# Patient Record
Sex: Female | Born: 1935
Health system: Southern US, Community
[De-identification: ages and names within clinical notes are randomized; demographics above are authoritative.]

## PROBLEM LIST (undated history)

## (undated) DIAGNOSIS — E785 Hyperlipidemia, unspecified: Secondary | ICD-10-CM

## (undated) DIAGNOSIS — I2510 Atherosclerotic heart disease of native coronary artery without angina pectoris: Secondary | ICD-10-CM

## (undated) DIAGNOSIS — R600 Localized edema: Secondary | ICD-10-CM

## (undated) DIAGNOSIS — I10 Essential (primary) hypertension: Secondary | ICD-10-CM

## (undated) DIAGNOSIS — I723 Aneurysm of iliac artery: Secondary | ICD-10-CM

## (undated) DIAGNOSIS — J45909 Unspecified asthma, uncomplicated: Secondary | ICD-10-CM

## (undated) DIAGNOSIS — J189 Pneumonia, unspecified organism: Secondary | ICD-10-CM

## (undated) DIAGNOSIS — C50912 Malignant neoplasm of unspecified site of left female breast: Secondary | ICD-10-CM

## (undated) DIAGNOSIS — M1990 Unspecified osteoarthritis, unspecified site: Secondary | ICD-10-CM

## (undated) DIAGNOSIS — E119 Type 2 diabetes mellitus without complications: Secondary | ICD-10-CM

## (undated) DIAGNOSIS — R0989 Other specified symptoms and signs involving the circulatory and respiratory systems: Secondary | ICD-10-CM

## (undated) HISTORY — DX: Essential (primary) hypertension: I10

## (undated) HISTORY — DX: Aneurysm of iliac artery: I72.3

## (undated) HISTORY — PX: SHOULDER ARTHROSCOPY W/ ROTATOR CUFF REPAIR: SHX2400

## (undated) HISTORY — DX: Localized edema: R60.0

## (undated) HISTORY — DX: Hyperlipidemia, unspecified: E78.5

## (undated) HISTORY — DX: Other specified symptoms and signs involving the circulatory and respiratory systems: R09.89

## (undated) HISTORY — PX: APPENDECTOMY: SHX54

## (undated) HISTORY — PX: CARDIAC CATHETERIZATION: SHX172

## (undated) HISTORY — PX: PILONIDAL CYST EXCISION: SHX744

---

## 1999-10-23 ENCOUNTER — Ambulatory Visit (HOSPITAL_COMMUNITY): Admission: RE | Admit: 1999-10-23 | Discharge: 1999-10-23 | Payer: Self-pay | Admitting: *Deleted

## 1999-10-23 ENCOUNTER — Encounter: Payer: Self-pay | Admitting: Unknown Physician Specialty

## 2002-01-14 ENCOUNTER — Ambulatory Visit (HOSPITAL_COMMUNITY): Admission: RE | Admit: 2002-01-14 | Discharge: 2002-01-14 | Payer: Self-pay | Admitting: Neurosurgery

## 2002-01-14 ENCOUNTER — Encounter: Payer: Self-pay | Admitting: Neurosurgery

## 2002-02-10 ENCOUNTER — Encounter: Admission: RE | Admit: 2002-02-10 | Discharge: 2002-02-10 | Payer: Self-pay | Admitting: Neurosurgery

## 2002-02-10 ENCOUNTER — Encounter: Payer: Self-pay | Admitting: Neurosurgery

## 2002-02-24 ENCOUNTER — Encounter: Payer: Self-pay | Admitting: Neurosurgery

## 2002-02-24 ENCOUNTER — Encounter: Admission: RE | Admit: 2002-02-24 | Discharge: 2002-02-24 | Payer: Self-pay | Admitting: Neurosurgery

## 2005-07-27 ENCOUNTER — Encounter: Payer: Self-pay | Admitting: Cardiology

## 2006-08-02 ENCOUNTER — Ambulatory Visit: Payer: Self-pay | Admitting: Cardiology

## 2006-08-03 DIAGNOSIS — R0989 Other specified symptoms and signs involving the circulatory and respiratory systems: Secondary | ICD-10-CM

## 2006-08-03 HISTORY — DX: Other specified symptoms and signs involving the circulatory and respiratory systems: R09.89

## 2006-08-12 ENCOUNTER — Ambulatory Visit: Payer: Self-pay | Admitting: Cardiology

## 2006-09-24 ENCOUNTER — Ambulatory Visit: Payer: Self-pay | Admitting: Cardiology

## 2006-10-12 ENCOUNTER — Encounter: Payer: Self-pay | Admitting: Cardiology

## 2007-07-04 ENCOUNTER — Encounter: Payer: Self-pay | Admitting: Cardiology

## 2007-12-07 ENCOUNTER — Ambulatory Visit: Payer: Self-pay | Admitting: Cardiology

## 2007-12-07 ENCOUNTER — Encounter: Payer: Self-pay | Admitting: Physician Assistant

## 2007-12-09 ENCOUNTER — Encounter: Payer: Self-pay | Admitting: Cardiology

## 2007-12-09 ENCOUNTER — Ambulatory Visit (HOSPITAL_COMMUNITY): Admission: RE | Admit: 2007-12-09 | Discharge: 2007-12-09 | Payer: Self-pay | Admitting: Cardiology

## 2007-12-09 ENCOUNTER — Ambulatory Visit: Payer: Self-pay | Admitting: Cardiology

## 2007-12-20 ENCOUNTER — Ambulatory Visit: Payer: Self-pay | Admitting: Cardiology

## 2007-12-21 ENCOUNTER — Encounter: Payer: Self-pay | Admitting: Cardiology

## 2007-12-21 DIAGNOSIS — E119 Type 2 diabetes mellitus without complications: Secondary | ICD-10-CM

## 2007-12-21 DIAGNOSIS — I10 Essential (primary) hypertension: Secondary | ICD-10-CM | POA: Insufficient documentation

## 2007-12-21 DIAGNOSIS — R0602 Shortness of breath: Secondary | ICD-10-CM

## 2007-12-21 DIAGNOSIS — R0989 Other specified symptoms and signs involving the circulatory and respiratory systems: Secondary | ICD-10-CM

## 2007-12-21 DIAGNOSIS — I519 Heart disease, unspecified: Secondary | ICD-10-CM

## 2008-11-20 ENCOUNTER — Encounter: Payer: Self-pay | Admitting: Cardiology

## 2009-03-12 ENCOUNTER — Encounter: Admission: RE | Admit: 2009-03-12 | Discharge: 2009-03-12 | Payer: Self-pay | Admitting: Orthopedic Surgery

## 2009-03-19 ENCOUNTER — Encounter: Admission: RE | Admit: 2009-03-19 | Discharge: 2009-03-19 | Payer: Self-pay | Admitting: Orthopedic Surgery

## 2009-04-02 ENCOUNTER — Encounter: Admission: RE | Admit: 2009-04-02 | Discharge: 2009-04-02 | Payer: Self-pay | Admitting: Orthopedic Surgery

## 2009-05-28 ENCOUNTER — Ambulatory Visit: Payer: Self-pay | Admitting: Cardiology

## 2009-05-28 DIAGNOSIS — I447 Left bundle-branch block, unspecified: Secondary | ICD-10-CM

## 2009-12-19 ENCOUNTER — Encounter: Admission: RE | Admit: 2009-12-19 | Discharge: 2009-12-19 | Payer: Self-pay | Admitting: Family Medicine

## 2009-12-24 ENCOUNTER — Ambulatory Visit: Payer: Self-pay | Admitting: Cardiology

## 2010-02-23 ENCOUNTER — Encounter: Payer: Self-pay | Admitting: Orthopedic Surgery

## 2010-03-04 NOTE — Assessment & Plan Note (Signed)
Summary: 6 month fu -recv reminder vs   Visit Type:  Follow-up Primary Yvette Roark:  Dr. Neita Carp   History of Present Illness: the patient is a 77 year old female with history of nonobstructive coronary artery disease, chronic diastolic dysfunction and hypertension. the patient is doing well.  She is on a dietary program.  She reports no chest pain shortness of breath orthopnea PND she reports no palpitations or syncope.  The patient does have a history of diabetes mellitus and is on statin drug therapy  Preventive Screening-Counseling & Management  Alcohol-Tobacco     Smoking Status: quit     Year Quit: 1993  Current Medications (verified): 1)  Oscal 500/200 D-3 500-200 Mg-Unit Tabs (Calcium Carbonate-Vitamin D) .... Take 1 Tablet By Mouth Once A Day 2)  Centrum Silver  Tabs (Multiple Vitamins-Minerals) .... Take 1 Tablet By Mouth Once A Day 3)  Aspir-Low 81 Mg Tbec (Aspirin) .... Take 1 Tablet By Mouth Once A Day 4)  Coq10 100 Mg Caps (Coenzyme Q10) .... Take 1 Tablet By Mouth Once A Day 5)  Fish Oil 1000 Mg Caps (Omega-3 Fatty Acids) .... Take 1 Tablet By Mouth Once A Day 6)  Metformin Hcl 500 Mg Xr24h-Tab (Metformin Hcl) .... Take 3 Tablet By Mouth Once A Day in Morning 7)  Diovan 160 Mg Tabs (Valsartan) .... Take 1 Tablet By Mouth Once A Day 8)  Simvastatin 80 Mg Tabs (Simvastatin) .... Take 1 Tablet By Mouth Once A Day 9)  Chlorthalidone 25 Mg Tabs (Chlorthalidone) .... Take 1/2 Tablet By Mouth Once A Day  Allergies (verified): No Known Drug Allergies  Comments:  Nurse/Medical Assistant: The patient's medication list and allergies were reviewed with the patient and were updated in the Medication and Allergy Lists.  Past History:  Past Medical History: Last updated: Jan 03, 2008 history of nonexertional chest pain relieved with nitroglycerin low risk it is in guarded left-sided ejection fraction 45% July 2008 exertional dyspnea and easy fatigability multiple cardiac risk  factors including hypertension,type 2 diabetes mellitus, dyslipidemia and age.Chronic lower extremity edema while femoral bruits with normalABIsJuly 2008.  Family History: Last updated: January 03, 2008 both parents are deceased, neither of whom had known coronary disease.  Father had a history of stroke and mother had a history of diabetes mellitus.  Social History: Last updated: 01/03/08 the patient lives in [place] with her husband.  She has two grown children.  She is not smoking 17 years.  She drinks alcohol occasionally.  Risk Factors: Smoking Status: quit (12/24/2009)  Vital Signs:  Patient profile:   77 year old female Height:      64.5 inches Weight:      151 pounds Pulse rate:   67 / minute BP sitting:   133 / 72  (left arm) Cuff size:   regular  Vitals Entered By: Carlye Grippe (December 24, 2009 10:49 AM)  Physical Exam  Additional Exam:  General: Well-developed, well-nourished in no distress head: Normocephalic and atraumatic eyes PERRLA/EOMI intact, conjunctiva and lids normal nose: No deformity or lesions mouth normal dentition, normal posterior pharynx neck: Supple, no JVD.  No masses, thyromegaly or abnormal cervical nodes lungs: Normal breath sounds bilaterally without wheezing.  Normal percussion heart: regular rate and rhythm with normal S1 and S2, no S3 or S4.  PMI is normal.  No pathological murmurs abdomen: Normal bowel sounds, abdomen is soft and nontender without masses, organomegaly or hernias noted.  No hepatosplenomegaly musculoskeletal: Back normal, normal gait muscle strength and tone normal pulsus: Pulse is  normal in all 4 extremities Extremities: No peripheral pitting edema neurologic: Alert and oriented x 3 skin: Intact without lesions or rashes cervical nodes: No significant adenopathy psychologic: Normal affect    Impression & Recommendations:  Problem # 1:  ESSENTIAL HYPERTENSION, BENIGN (ICD-401.1) will purchase controlled continue  medical therapy Her updated medication list for this problem includes:    Aspir-low 81 Mg Tbec (Aspirin) .Marland Kitchen... Take 1 tablet by mouth once a day    Diovan 160 Mg Tabs (Valsartan) .Marland Kitchen... Take 1 tablet by mouth once a day    Chlorthalidone 25 Mg Tabs (Chlorthalidone) .Marland Kitchen... Take 1/2 tablet by mouth once a day  Problem # 2:  AODM (ICD-250.00) patient is a statin drug therapy in the setting of nonobstructive coronary artery disease and diabetes mellitus Her updated medication list for this problem includes:    Aspir-low 81 Mg Tbec (Aspirin) .Marland Kitchen... Take 1 tablet by mouth once a day    Metformin Hcl 500 Mg Xr24h-tab (Metformin hcl) .Marland Kitchen... Take 3 tablet by mouth once a day in morning    Diovan 160 Mg Tabs (Valsartan) .Marland Kitchen... Take 1 tablet by mouth once a day  Problem # 3:  DIASTOLIC DYSFUNCTION (ICD-429.9) no evidence of fluid overload.  Continue medical therapy  Patient Instructions: 1)  Your physician recommends that you continue on your current medications as directed. Please refer to the Current Medication list given to you today. 2)  Follow up in  1 year

## 2010-03-04 NOTE — Assessment & Plan Note (Signed)
Summary: 1 YR FU   Visit Type:  Follow-up Primary Provider:  Dr. Neita Carp  CC:  follow-up visit.  History of Present Illness: the patient is a 77 year old female with history of nonobstructive coronary artery disease, chronic diastolic dysfunction and hypertension. The patient still reports some shortness of breath on moderate exertion. Today her blood pressure is not very well controlled. She'll stop taking her blood pressure readings. She denies any central chest pain. She does state that she's had some problems with left leg pain and left knee pain which has limited her exercise. This appears to be improving and she is planning to improve her exercise pattern. The patient denies any palpitations shortness of breath at rest orthopnea PND.  Preventive Screening-Counseling & Management  Alcohol-Tobacco     Smoking Status: quit     Year Quit: 1993  Current Problems (verified): 1)  Lbbb  (ICD-426.3) 2)  Dyspnea  (ICD-786.05) 3)  Essential Hypertension, Benign  (ICD-401.1) 4)  Aodm  (ICD-250.00) 5)  Femoral Bruit, Right  (ICD-785.9) 6)  Diastolic Dysfunction  (ICD-429.9)  Current Medications (verified): 1)  Caltrate 600+d 600-400 Mg-Unit Tabs (Calcium Carbonate-Vitamin D) .... Take 1 Tablet By Mouth Once A Day 2)  Centrum Silver  Tabs (Multiple Vitamins-Minerals) .... Take 1 Tablet By Mouth Once A Day 3)  Aspir-Low 81 Mg Tbec (Aspirin) .... Take 1 Tablet By Mouth Once A Day 4)  Coq10 100 Mg Caps (Coenzyme Q10) .... Take 1 Tablet By Mouth Once A Day 5)  Fish Oil 1000 Mg Caps (Omega-3 Fatty Acids) .... Take 1 Tablet By Mouth Once A Day 6)  Metformin Hcl 500 Mg Xr24h-Tab (Metformin Hcl) .... Take 2 Tablet By Mouth Once A Day 7)  Diovan 160 Mg Tabs (Valsartan) .... Take 1 Tablet By Mouth Once A Day 8)  Simvastatin 80 Mg Tabs (Simvastatin) .... Take 1 Tablet By Mouth Once A Day 9)  Chlorthalidone 25 Mg Tabs (Chlorthalidone) .... Take 1 Tablet By Mouth Once A Day  Allergies (verified): No  Known Drug Allergies  Comments:  Nurse/Medical Assistant: The patient's medications and allergies were reviewed with the patient and were updated in the Medication and Allergy Lists. Bottles reviewed.  Past History:  Past Medical History: Last updated: 12-27-2007 history of nonexertional chest pain relieved with nitroglycerin low risk it is in guarded left-sided ejection fraction 45% July 2008 exertional dyspnea and easy fatigability multiple cardiac risk factors including hypertension,type 2 diabetes mellitus, dyslipidemia and age.Chronic lower extremity edema while femoral bruits with normalABIsJuly 2008.  Family History: Last updated: 2007-12-27 both parents are deceased, neither of whom had known coronary disease.  Father had a history of stroke and mother had a history of diabetes mellitus.  Social History: Last updated: Dec 27, 2007 the patient lives in [place] with her husband.  She has two grown children.  She is not smoking 17 years.  She drinks alcohol occasionally.  Risk Factors: Smoking Status: quit (05/28/2009)  Review of Systems       The patient complains of shortness of breath.  The patient denies fatigue, malaise, fever, weight gain/loss, vision loss, decreased hearing, hoarseness, chest pain, palpitations, prolonged cough, wheezing, sleep apnea, coughing up blood, abdominal pain, blood in stool, nausea, vomiting, diarrhea, heartburn, incontinence, blood in urine, muscle weakness, joint pain, leg swelling, rash, skin lesions, headache, fainting, dizziness, depression, anxiety, enlarged lymph nodes, easy bruising or bleeding, and environmental allergies.    Vital Signs:  Patient profile:   77 year old female Height:  64.5 inches Weight:      158 pounds BMI:     26.80 Pulse rate:   80 / minute BP sitting:   160 / 91  (left arm) Cuff size:   regular  Vitals Entered By: Carlye Grippe (May 28, 2009 8:58 AM)  Nutrition Counseling: Patient's BMI is greater than  25 and therefore counseled on weight management options. CC: follow-up visit   Physical Exam  Additional Exam:  General: Well-developed, well-nourished in no distress head: Normocephalic and atraumatic eyes PERRLA/EOMI intact, conjunctiva and lids normal nose: No deformity or lesions mouth normal dentition, normal posterior pharynx neck: Supple, no JVD.  No masses, thyromegaly or abnormal cervical nodes lungs: Normal breath sounds bilaterally without wheezing.  Normal percussion heart: regular rate and rhythm with normal S1 and S2, no S3 or S4.  PMI is normal.  No pathological murmurs abdomen: Normal bowel sounds, abdomen is soft and nontender without masses, organomegaly or hernias noted.  No hepatosplenomegaly musculoskeletal: Back normal, normal gait muscle strength and tone normal pulsus: Pulse is normal in all 4 extremities Extremities: No peripheral pitting edema neurologic: Alert and oriented x 3 skin: Intact without lesions or rashes cervical nodes: No significant adenopathy psychologic: Normal affect    EKG  Procedure date:  05/28/2009  Findings:      normal sinus rhythm with left bundle branch block  Impression & Recommendations:  Problem # 1:  ESSENTIAL HYPERTENSION, BENIGN (ICD-401.1) the patient's blood pressure remains poorly controlled. This is intermittent or shortness of breath in the setting of diastolic dysfunction. I will add chlorthalidone 25 must be acute ill he The following medications were removed from the medication list:    Metoprolol Tartrate 25 Mg Tabs (Metoprolol tartrate) .Marland Kitchen... Take 1/2 tablet by mouth twice a day Her updated medication list for this problem includes:    Aspir-low 81 Mg Tbec (Aspirin) .Marland Kitchen... Take 1 tablet by mouth once a day    Diovan 160 Mg Tabs (Valsartan) .Marland Kitchen... Take 1 tablet by mouth once a day    Chlorthalidone 25 Mg Tabs (Chlorthalidone) .Marland Kitchen... Take 1 tablet by mouth once a day  Orders: EKG w/ Interpretation  (93000)  Problem # 2:  DIASTOLIC DYSFUNCTION (ICD-429.9) no evidence of volume overload. No evidence of heart failure symptoms  Problem # 3:  AODM (ICD-250.00) Assessment: Comment Only  Her updated medication list for this problem includes:    Aspir-low 81 Mg Tbec (Aspirin) .Marland Kitchen... Take 1 tablet by mouth once a day    Metformin Hcl 500 Mg Xr24h-tab (Metformin hcl) .Marland Kitchen... Take 2 tablet by mouth once a day    Diovan 160 Mg Tabs (Valsartan) .Marland Kitchen... Take 1 tablet by mouth once a day  Orders: EKG w/ Interpretation (93000)  Problem # 4:  LBBB (ICD-426.3) chronic The following medications were removed from the medication list:    Imdur 30 Mg Xr24h-tab (Isosorbide mononitrate) .Marland Kitchen... Take 1 tablet by mouth once a day    Metoprolol Tartrate 25 Mg Tabs (Metoprolol tartrate) .Marland Kitchen... Take 1/2 tablet by mouth twice a day Her updated medication list for this problem includes:    Aspir-low 81 Mg Tbec (Aspirin) .Marland Kitchen... Take 1 tablet by mouth once a day  Patient Instructions: 1)  Chlorthalidone 25mg  daily 2)  Follow up in  6 months  Prescriptions: CHLORTHALIDONE 25 MG TABS (CHLORTHALIDONE) Take 1 tablet by mouth once a day  #30 x 6   Entered by:   Hoover Brunette, LPN   Authorized by:   Lewayne Bunting, MD,  FACC   Signed by:   Hoover Brunette, LPN on 86/57/8469   Method used:   Electronically to        [facility] (retail)       [address]       [place], [place]  [address]       Ph: [phone]       Fax: [phone]   RxID:   [id number]   Handout requested.

## 2010-06-17 NOTE — Cardiovascular Report (Signed)
Penny Huynh, Penny Huynh             ACCOUNT NO.:  [id number]   MEDICAL RECORD NO.:  [id number]          PATIENT TYPE:  OIB   LOCATION:  2899                         FACILITY:  [facility]   PHYSICIAN:  Everardo Beals. Juanda Chance, MD, FACCDATE OF BIRTH:  11/23/1933   DATE OF PROCEDURE:  12/09/2007  DATE OF DISCHARGE:  12/09/2007                            CARDIAC CATHETERIZATION   CLINICAL HISTORY:  Ms.  Huynh is a 77 year old and has a history of  hypertension and recently has had symptoms of dyspnea on exertion.  She  had had a previous Myoview scan, which showed mild anterior defect,  which was fixed.  Because of her symptoms, Dr. Andee Lineman made a decision to  evaluate her with angiography.   PROCEDURE:  Left heart catheterization was performed percutaneously via  the right femoral arteries and arterial sheath and 5-French pyriform  coronary catheters.  We had some difficulty navigating up the iliac  vessel, which were calcified and had to used an exchange wire in a  Wholey wire.  Right heart catheterization was performed percutaneously  via the right femoral vein using venous sheath and Swan-Ganz  thermodilution catheter.  The patient tolerated the procedure well and  left the laboratory in satisfactory condition.  Distal aortogram was  performed to evaluate the patient for peripheral vascular disease in  view of the difficulty with passing catheters and calcified vessels.   RESULTS:  The left main coronary artery and right main coronary artery  was free of significant disease.   Left anterior descending artery:  The left anterior descending artery  gave rise to 2 diagonal branch and set of septal perforators.  There was  30% narrowing in the mid LAD and some irregularities, but no significant  obstruction.   Circumflex artery:  The circumflex artery was a moderately large vessel  gave rise to a ramus branch, a marginal branch and 2 large  posterolateral branches.  There was some irregularities,  but there was  no significant obstruction.   Right coronary artery:  The right coronary artery was a moderately large  vessel gave rise to a conus branch, a large right ventricle branch,  posterior descending branch and a very small posterolateral branch.  The  posterolateral branch had tapered quickly, but I think there was no  obstruction there.  There was no other obstruction seen.   Left ventriculogram:  The left ventriculogram performed in the RAO  projection showed very mild global hypokinesis.  The estimated ejection  fraction was 50%.   Distal aortogram:  The distal aortogram was performed, which showed  patent renal arteries and no significant aortoiliac obstruction.  There  were irregularities and with calcification.   CONCLUSION:  1. Minimal nonobstructive coronary artery disease with 30% narrowing      in the mid LAD and mild irregularities in the circumflex artery.  2. Ejection fraction 50%.  3. Patent renal arteries.  4. Mild elevation of left ventricular filling pressures.   ADDENDUM:  The right atrial pressure was 9 mean.  The pulmonary artery  pressure was 38/14 with a mean of 23.  Pulmonary wedge pressure was 18  mean.  Left ventricular pressure was 143/10.  Aortic pressure was 143/59  with mean of 84.  Cardiac output/cardiac index was 5.4/2.9  liters/minute/meter squared.   RECOMMENDATIONS:  The patient has normal coronaries and an ejection  fraction of 50%.  The pulmonary wedge pressure is mildly elevated and  she could have some dyspnea related to mild diastolic dysfunction.  We  will plan medical therapy and reassurance.  We will arrange followup  with Dr. Andee Lineman in about 2 weeks.      Penny Elvera Lennox Juanda Chance, MD, [facility]  Electronically Signed     BRB/MEDQ  D:  12/09/2007  T:  12/09/2007  Job:  [id number]   cc:   Learta Codding, MD,FACC  Fara Chute

## 2010-06-17 NOTE — Assessment & Plan Note (Signed)
[facility]                          [facility] OFFICE NOTE   SIGNA, CHEEK                    MRN:          [id number]  DATE:09/24/2006                            DOB:          Jan 13, 1934    HISTORY OF PRESENT ILLNESS:  Patient is a former patient of Dr. Eliberto Ivory.  Patient was referred after she had a CT scan done of the abdomen and the  pelvis.  On this study, it was noted that she had atherosclerotic  disease of the iliac vessels with significant focus of the right common  iliac artery with mild poststenotic dilatation.  The patient was then  referred to our office for further evaluation, particularly in light of  her initial risk factors, including hypertension and diabetes mellitus  as well as dyslipidemia.  The patient at that time did not report any  symptoms of substernal chest pain.  A Cardiolite study was performed,  which essentially was low risk.  Ejection fraction was abnormal at 45%.  There was a mild-to-moderate atrial septal defect which was  nonreversible.  There were no definite areas of reversibility seen.  Patient also had a carotid Doppler study done with no significant  internal carotid artery disease.  ABIs also were normal bilaterally at  1.01 and 1.01.  The patient clinically has no claudication.  Interestingly, since his last office visit, the patient had a single  episode of substernal chest pain which lasted 10-15 minutes while she  was in [place] visiting the arch.  The patient states that this  occurred at rest and was rather severe but not associated with shortness  of breath or diaphoresis.  She has had no further episodes since then.   MEDICATIONS:  1. Simvastatin 80 mg p.o. daily.  2. Lotrel 5/20 mg p.o. daily.  3. Furosemide 40 mg 1/2 tablet p.r.n. swelling.  4. Actos 45 mg a day.  5. Aspirin 81 mg p.o. daily.  6. Caltrate and ibuprofen p.r.n.   PHYSICAL EXAMINATION:  VITAL SIGNS:  Blood pressure  139/78, heart rate  78, weight 182 pounds.  NECK:  Normal carotid upstrokes.  No carotid bruits.  LUNGS:  Clear breath sounds bilaterally.  HEART:  Regular rate and rhythm.  Normal S1 and S2.  No murmurs, gallops  or rubs.  ABDOMEN:  Soft, nontender.  No rebound or guarding.  Good bowel sounds.  EXTREMITIES:  No clubbing, cyanosis or edema.   PROBLEM LIST:  1. Abnormal Cardiolite stress study.  See details above.  2. Multiple cardiac risk factors.  3. No significant carotid artery disease.  4. Atheromatosis and atherosclerosis of the distal aorta with possible      focal lesion in the right common iliac artery.  (Negative ABIs,      however, and no definite claudication).   PLAN:  1. The patient's Cardiolite study was abnormal, but there was no      definite ischemia noted.  The patient was actually doing quite well      and has good exercise tolerance.  She did have a single episode of  chest pain which was rather atypical.  I did tell the patient,      however, that if she has recurrent pain, given her risk factors      profile and abnormal Cardiolite study, would consider cardiac      catheterization.  I think at the present time, this is a little bit      premature; however, I told her that she needs aggressive risk      factor modification.  2. Although the patient may well have peripheral vascular stenosis,      there is no evidence by ABI that she has any functional limitation.      At this point in time, we will focus on risk factor modification.      If the patient becomes symptomatic, we could consider intervention.  3. No evidence of significant carotid artery disease.  The patient was      reassured about the results.  4. Patient can follow up with [place] in six months.     Learta Codding, MD,FACC  Electronically Signed    GED/MedQ  DD: 09/24/2006  DT: 09/25/2006  Job #: [id number]   cc:   Fara Chute

## 2010-06-17 NOTE — Assessment & Plan Note (Signed)
[facility]                          [facility] OFFICE NOTE   Penny Huynh, Penny Huynh                    MRN:          [id number]  DATE:12/07/2007                            DOB:          October 27, 1933    PRIMARY CARDIOLOGIST:  Learta Codding, MD, [facility]   REASON FOR VISIT:  Annual followup.   Since last seen here in the clinic in August 2008, by Dr. Andee Lineman, Ms.  Huynh reports a second episode of chest pain, approximately 6 weeks  ago, for which she took a single tablet of nitroglycerin.  She does not  recall the circumstances during which she had her chest pain, but does  remember having fairly prompt relief with the lone tablet of  nitroglycerin.  She has not had any recurrent chest pain since that  episode.  In fact, she continues to walk on a daily basis, some 2-3  miles a day, completing it in 20 minutes.  She denies any associated  chest discomfort.  Of note, this is only on level ground.  When queried  about climbing up a full flight of stairs, or up an incline, she does  suggest that she would have significant shortness of breath with this.  Moreover, she points out that she feels easily fatigued and worn out,  even with simple household activities.  There seems to be some  progressive nature to this, since she was last seen in the clinic.   Penny Huynh has never undergone coronary angiography.  She had an  adenosine stress test in July 2008, reviewed by Dr. Andee Lineman, which did  not suggest any definite ischemia.  There was a mild/moderate septal  defect which was nonreversible, and Dr. Andee Lineman felt that this was a low-  risk study.  Ejection fraction was calculated at 45%.   EKG today indicates NSR at 67 bpm.  Whereas there was previous evidence  of an LBBB when she was last here in the clinic, this has since  resolved.  Instead, there is symmetric T-wave inversion in leads V1 and  V2, which are new.  However, we have no previous baseline  EKGs, absent  of LBBB, for comparison.   ALLERGIES:  No known drug allergies.   CURRENT MEDICATIONS:  1. Aspirin 81 daily.  2. Actos 45 daily.  3. Furosemide 20 q.a.m.  4. Lotrel 5/20 daily.  5. Simvastatin 80 daily.   PAST MEDICAL HISTORY:  1. Hypertension.  2. Type 2 diabetes mellitus.  3. Chronic lower extremity edema.  4. Dyslipidemia.  5. Bilateral pulmonary nodules (subcentimeter).      a.     Stable by chest CT scan, June 2009.   SURGICAL HISTORY:  Remote appendectomy, pilonidal cyst, and recent right  rotator cuff surgery.   SOCIAL HISTORY:  The patient lives here in [place] with her husband.  She  has 2 grown children.  She has not smoked in 17 years.  Drinks alcohol  socially.   FAMILY HISTORY:  Both parents deceased, neither of whom had known  coronary artery disease.  Father had history of stroke and mother had  history  of diabetes mellitus.   REVIEW OF SYSTEMS:  Denies any recent development of orthopnea or PND.  Denies intermittent claudication.  Otherwise as noted per HPI, remaining  systems negative.   PHYSICAL EXAMINATION:  VITAL SIGNS:  Blood pressure currently 119/69;  pulse 71, regular; and weight 190.8 (up 8).  GENERAL:  A 77 year old female, sitting upright, no distress.  HEENT:  Normocephalic and atraumatic.  PERRLA.  EOMI.  NECK:  Palpable carotid pulses are without bruits; no JVD at 90 degrees.  No thyromegaly.  LUNGS:  Clear to auscultation in all fields.  HEART:  Regular rate and rhythm.  Positive S4.  No significant murmurs.  No rubs.  ABDOMEN:  Soft, nontender, and intact bowel sounds.  No bruits.  EXTREMITIES:  Palpable bilateral femoral pulses with bilateral bruits  (left greater than right).  A palpable dorsalis pedis pulses with 1+  lower extremity edema.  SKIN:  No obvious rash or lesions.  MUSCULOSKELETAL:  No gross deformity.  NEURO:  No focal deficit.   IMPRESSION:  1. Status post recent, nonexertional chest pain.      a.      Relieved with nitroglycerin.      b.     Low-risk adenosine Cardiolite; EF 45%, July 2008.  2. Exertional dyspnea/easy fatigability.      a.     Worrisome for anginal equivalent.  3. Multiple cardiac risk factors.      a.     Hypertension.      b.     Type 2 diabetes mellitus.      c.     Dyslipidemia.      d.     Age.  4. Chronic lower extremity edema.  5. Femoral bruits.      a.     Normal ankle-brachial index, July 2008.   PLAN:  1. Recommendation is to proceed with definitive exclusion of      significant, underlying coronary artery disease with a diagnostic      coronary angiogram.  The patient is in agreement with this plan and      risk/benefits of the procedure were discussed.  Dr. Andee Lineman is also      in accordance with this, and we will make arrangements for the      patient to be scheduled in the [facility] later this      week.  2. In the interim, her medications will be adjusted with up titration      of aspirin to full dose, addition of Imdur 30 mg daily, and      addition of Lopressor 12.5 mg b.i.d.  The patient does have p.r.n.      nitroglycerin.      Gene Serpe, PA-C  Electronically Signed      Learta Codding, MD,FACC  Electronically Signed   GS/MedQ  DD: 12/07/2007  DT: 12/08/2007  Job #: [id number]   cc:   Fara Chute

## 2010-07-31 ENCOUNTER — Encounter: Payer: Self-pay | Admitting: Cardiology

## 2010-11-04 LAB — CBC
Hemoglobin: 11.7 — ABNORMAL LOW
RBC: 3.79 — ABNORMAL LOW
RDW: 14.2
WBC: 3.3 — ABNORMAL LOW

## 2010-11-04 LAB — BASIC METABOLIC PANEL
BUN: 14
Calcium: 9.2
Chloride: 103
Creatinine, Ser: 0.79
GFR calc non Af Amer: >60
Potassium: 4

## 2010-11-04 LAB — POCT I-STAT 3, VENOUS BLOOD GAS (G3P V)
Acid-Base Excess: 1
pO2, Ven: 35

## 2010-11-04 LAB — POCT I-STAT 3, ART BLOOD GAS (G3+)
TCO2: 29
pCO2 arterial: 45.7 — ABNORMAL HIGH
pH, Arterial: 7.388
pO2, Arterial: 68 — ABNORMAL LOW

## 2010-11-04 LAB — PROTIME-INR: INR: 1

## 2010-11-04 LAB — GLUCOSE, CAPILLARY
Glucose-Capillary: 139 — ABNORMAL HIGH
Glucose-Capillary: 98

## 2010-11-10 DIAGNOSIS — E7800 Pure hypercholesterolemia, unspecified: Secondary | ICD-10-CM | POA: Insufficient documentation

## 2010-11-12 DIAGNOSIS — G5600 Carpal tunnel syndrome, unspecified upper limb: Secondary | ICD-10-CM | POA: Insufficient documentation

## 2011-03-24 ENCOUNTER — Telehealth: Payer: Self-pay | Admitting: Cardiology

## 2011-03-24 NOTE — Telephone Encounter (Signed)
10/04/10 & 12/31/10 & 03/24/11 reminder mailed to schedule f/u-srs °

## 2011-05-12 DIAGNOSIS — E7800 Pure hypercholesterolemia, unspecified: Secondary | ICD-10-CM | POA: Diagnosis not present

## 2011-05-12 DIAGNOSIS — E119 Type 2 diabetes mellitus without complications: Secondary | ICD-10-CM | POA: Diagnosis not present

## 2011-05-12 DIAGNOSIS — I10 Essential (primary) hypertension: Secondary | ICD-10-CM | POA: Diagnosis not present

## 2011-05-21 DIAGNOSIS — E119 Type 2 diabetes mellitus without complications: Secondary | ICD-10-CM | POA: Diagnosis not present

## 2011-05-21 DIAGNOSIS — E876 Hypokalemia: Secondary | ICD-10-CM | POA: Diagnosis not present

## 2011-05-21 DIAGNOSIS — E669 Obesity, unspecified: Secondary | ICD-10-CM | POA: Diagnosis not present

## 2011-05-21 DIAGNOSIS — I10 Essential (primary) hypertension: Secondary | ICD-10-CM | POA: Diagnosis not present

## 2011-05-21 DIAGNOSIS — E7800 Pure hypercholesterolemia, unspecified: Secondary | ICD-10-CM | POA: Diagnosis not present

## 2011-07-16 ENCOUNTER — Ambulatory Visit (INDEPENDENT_AMBULATORY_CARE_PROVIDER_SITE_OTHER): Payer: Medicare Other | Admitting: Cardiology

## 2011-07-16 ENCOUNTER — Encounter: Payer: Self-pay | Admitting: Cardiology

## 2011-07-16 VITALS — BP 130/76 | HR 65 | Ht 64.5 in | Wt 168.1 lb

## 2011-07-16 DIAGNOSIS — I519 Heart disease, unspecified: Secondary | ICD-10-CM | POA: Diagnosis not present

## 2011-07-16 DIAGNOSIS — I447 Left bundle-branch block, unspecified: Secondary | ICD-10-CM | POA: Diagnosis not present

## 2011-07-16 DIAGNOSIS — R0989 Other specified symptoms and signs involving the circulatory and respiratory systems: Secondary | ICD-10-CM

## 2011-07-16 DIAGNOSIS — R0602 Shortness of breath: Secondary | ICD-10-CM

## 2011-07-16 DIAGNOSIS — I2510 Atherosclerotic heart disease of native coronary artery without angina pectoris: Secondary | ICD-10-CM | POA: Diagnosis not present

## 2011-07-16 NOTE — Progress Notes (Signed)
Penny Bottoms, MD, [facility] ABIM Board Certified in Adult Cardiovascular Medicine,Internal Medicine and Critical Care Medicine    CC: Followup patient with diastolic dysfunction diabetes mellitus  HPI:  Patient has a history of nonobstructive coronary artery disease. The patient denies any chest pain orthopnea or PND. She also reports no claudication. She continues to have some shortness of breath on exertion. She did again however 17 pounds since her last office visit. There is no evidence of fluid overload and disheveled element of deconditioning. However the patient has some anterior T wave changes by EKG albeit without chest pain. The patient is due for a followup echocardiogram. Reportedly her cholesterol-lowering medications have been decreased by primary care physician given the decrease in her LDL.  PMH: reviewed and listed in Problem List in Electronic Records (and see below) Past Medical History  Diagnosis Date  . History of chest pain at rest     Nonexertional; relieved with nitroglycerin  . Ejection fraction < 50% 7/08    Low risk it is in guarded left-sided EF 45%  . Exertional dyspnea     And easy fatigability  . Dyslipidemia   . Diabetes mellitus     Type II  . Hypertension   . Lower extremity edema     Chronic  . Femoral bruit 7/08    With normal ABIs   No past surgical history on file.  Allergies/SH/FHX : available in Electronic Records for review  Not on File History   Social History  . Marital Status: Married    Spouse Name: N/A    Number of Children: 2  . Years of Education: N/A   Occupational History  . Not on file.   Social History Main Topics  . Smoking status: Former Smoker    Quit date: 12/04/1994  . Smokeless tobacco: Not on file  . Alcohol Use: Yes     Occasionally  . Drug Use: Not on file  . Sexually Active: Not on file   Other Topics Concern  . Not on file   Social History Narrative   Lives in [place] with her husband.Has 2 grown  children.   Family History  Problem Relation Age of Onset  . Diabetes Mother   . Stroke Father     Medications: Current Outpatient Prescriptions  Medication Sig Dispense Refill  . aspirin 81 MG EC tablet Take 81 mg by mouth daily.        . calcium-vitamin D (OSCAL WITH D) 500-200 MG-UNIT per tablet Take 1 tablet by mouth daily.        . chlorthalidone (HYGROTON) 25 MG tablet Take 25 mg by mouth daily.       . Coenzyme Q10 (COQ10) 100 MG CAPS Take 100 mg by mouth daily.        . metFORMIN (GLUMETZA) 500 MG (MOD) 24 hr tablet Take 1,000 mg by mouth daily with breakfast.       . Multiple Vitamins-Minerals (CENTRUM SILVER) tablet Take 1 tablet by mouth daily.        . Omega-3 Fatty Acids (FISH OIL) 1000 MG CAPS Take 1,000 mg by mouth daily.        . simvastatin (ZOCOR) 80 MG tablet Take 40 mg by mouth daily.       . valsartan (DIOVAN) 160 MG tablet Take 160 mg by mouth daily.          ROS: No nausea or vomiting. No fever or chills.No melena or hematochezia.No bleeding.No claudication  Physical  Exam: BP 130/76  Pulse 65  Ht 5' 4.5" (1.638 m)  Wt 168 lb 1.9 oz (76.259 kg)  BMI 28.41 kg/m2 General: Well-nourished African American female in no distress. Neck: Normal carotid upstroke no carotid bruits. No thyromegaly nonnodular thyroid. JVP 6 cm Lungs: Clear breath sounds bilaterally without wheezing. Cardiac: Regular rate and rhythm with normal S1-S2 no murmur rubs or gallops Vascular: No edema. Normal distal pulses Skin: Warm and dry Physcologic: Normal affect  12lead ECG: Normal sinus rhythm with anterior T-wave changes Limited bedside ECHO:N/A No images are attached to the encounter.   Assessment and Plan  DIASTOLIC DYSFUNCTION No evidence of volume overload. The patient did have significant weight gain from 100 5168 pounds but there is no evidence of this secondary to fluid overload.  DYSPNEA Patient has chronic dyspnea. In large part I think this is related to  deconditioning. The patient has a history of nonobstructive coronary artery disease. If her symptoms worsens she may need to be a candidate for a stress echocardiogram. In the interim we will proceed with a baseline echocardiogram to reassess her LV function as well as a degree of LV diastolic dysfunction  FEMORAL BRUIT, RIGHT Previously normal ABIs. No symptoms of claudication  LBBB Resolved. Possibly rate related   Patient Active Problem List  Diagnosis  . AODM  . ESSENTIAL HYPERTENSION, BENIGN  . LBBB  . DIASTOLIC DYSFUNCTION  . FEMORAL BRUIT, RIGHT  . DYSPNEA

## 2011-07-16 NOTE — Assessment & Plan Note (Signed)
No evidence of volume overload. The patient did have significant weight gain from 100 5168 pounds but there is no evidence of this secondary to fluid overload.

## 2011-07-16 NOTE — Assessment & Plan Note (Signed)
Resolved. Possibly rate related

## 2011-07-16 NOTE — Assessment & Plan Note (Signed)
Previously normal ABIs. No symptoms of claudication

## 2011-07-16 NOTE — Assessment & Plan Note (Addendum)
Patient has chronic dyspnea. In large part I think this is related to deconditioning. The patient has a history of nonobstructive coronary artery disease. If her symptoms worsens she may need to be a candidate for a stress echocardiogram. In the interim we will proceed with a baseline echocardiogram to reassess her LV function as well as a degree of LV diastolic dysfunction

## 2011-07-16 NOTE — Patient Instructions (Signed)
   Echo - do in 2 weeks  Office will contact you with results.   Your physician wants you to follow up in:  1 year.  You will receive a reminder letter in the mail one-two months in advance.  If you don't receive a letter, please call our office to schedule the follow up appointment

## 2011-07-30 ENCOUNTER — Other Ambulatory Visit (INDEPENDENT_AMBULATORY_CARE_PROVIDER_SITE_OTHER): Payer: Medicare Other

## 2011-07-30 ENCOUNTER — Other Ambulatory Visit: Payer: Self-pay

## 2011-07-30 DIAGNOSIS — R0602 Shortness of breath: Secondary | ICD-10-CM

## 2011-07-30 DIAGNOSIS — R072 Precordial pain: Secondary | ICD-10-CM

## 2011-08-03 ENCOUNTER — Telehealth: Payer: Self-pay | Admitting: *Deleted

## 2011-08-03 NOTE — Telephone Encounter (Signed)
Message copied by Lesle Chris on Mon Aug 03, 2011 12:12 PM ------      Message from: Learta Codding      Created: Sun Aug 02, 2011  1:23 PM       Echo stable

## 2011-08-03 NOTE — Telephone Encounter (Signed)
Left message to return call 

## 2011-08-07 NOTE — Telephone Encounter (Signed)
Patient notified and verbalized understanding. 

## 2011-10-13 DIAGNOSIS — E7800 Pure hypercholesterolemia, unspecified: Secondary | ICD-10-CM | POA: Diagnosis not present

## 2011-10-13 DIAGNOSIS — I10 Essential (primary) hypertension: Secondary | ICD-10-CM | POA: Diagnosis not present

## 2011-10-13 DIAGNOSIS — E876 Hypokalemia: Secondary | ICD-10-CM | POA: Diagnosis not present

## 2011-10-13 DIAGNOSIS — E119 Type 2 diabetes mellitus without complications: Secondary | ICD-10-CM | POA: Diagnosis not present

## 2011-10-15 DIAGNOSIS — J309 Allergic rhinitis, unspecified: Secondary | ICD-10-CM | POA: Insufficient documentation

## 2011-10-15 DIAGNOSIS — E7800 Pure hypercholesterolemia, unspecified: Secondary | ICD-10-CM | POA: Diagnosis not present

## 2011-10-15 DIAGNOSIS — E669 Obesity, unspecified: Secondary | ICD-10-CM | POA: Diagnosis not present

## 2011-10-15 DIAGNOSIS — I10 Essential (primary) hypertension: Secondary | ICD-10-CM | POA: Diagnosis not present

## 2011-10-15 DIAGNOSIS — E119 Type 2 diabetes mellitus without complications: Secondary | ICD-10-CM | POA: Diagnosis not present

## 2011-10-15 DIAGNOSIS — R51 Headache: Secondary | ICD-10-CM | POA: Diagnosis not present

## 2011-12-04 DIAGNOSIS — Z23 Encounter for immunization: Secondary | ICD-10-CM | POA: Diagnosis not present

## 2012-01-20 DIAGNOSIS — E119 Type 2 diabetes mellitus without complications: Secondary | ICD-10-CM | POA: Diagnosis not present

## 2012-01-20 DIAGNOSIS — H2510 Age-related nuclear cataract, unspecified eye: Secondary | ICD-10-CM | POA: Diagnosis not present

## 2012-05-12 ENCOUNTER — Telehealth: Payer: Self-pay | Admitting: Cardiology

## 2012-05-12 NOTE — Telephone Encounter (Signed)
PATIENT WILL BE FOLLOWING DEGENT 05/12/12-SRS

## 2012-05-24 DIAGNOSIS — E7800 Pure hypercholesterolemia, unspecified: Secondary | ICD-10-CM | POA: Diagnosis not present

## 2012-05-24 DIAGNOSIS — E119 Type 2 diabetes mellitus without complications: Secondary | ICD-10-CM | POA: Diagnosis not present

## 2012-05-24 DIAGNOSIS — I10 Essential (primary) hypertension: Secondary | ICD-10-CM | POA: Diagnosis not present

## 2012-09-01 ENCOUNTER — Encounter: Payer: Self-pay | Admitting: Cardiovascular Disease

## 2012-09-01 ENCOUNTER — Ambulatory Visit (INDEPENDENT_AMBULATORY_CARE_PROVIDER_SITE_OTHER): Payer: Medicare Other | Admitting: Cardiovascular Disease

## 2012-09-01 VITALS — BP 145/77 | HR 62 | Ht 64.5 in | Wt 166.0 lb

## 2012-09-01 DIAGNOSIS — R0602 Shortness of breath: Secondary | ICD-10-CM

## 2012-09-01 DIAGNOSIS — I10 Essential (primary) hypertension: Secondary | ICD-10-CM

## 2012-09-01 DIAGNOSIS — I447 Left bundle-branch block, unspecified: Secondary | ICD-10-CM

## 2012-09-01 DIAGNOSIS — I519 Heart disease, unspecified: Secondary | ICD-10-CM

## 2012-09-01 DIAGNOSIS — I5189 Other ill-defined heart diseases: Secondary | ICD-10-CM

## 2012-09-01 NOTE — Progress Notes (Signed)
Patient ID: Penny Huynh, female   DOB: [birth year], 79 y.o.   MRN: [id number]    SUBJECTIVE: Penny Huynh has a history of nonobstructive coronary artery disease. She denies any chest pain orthopnea or PND. She also reports no claudication. She continues to have some shortness of breath on exertion, which is chronic. She says her BP is normally fine at home.  She walks and does exercises in the pool.  She was diagnosed with asthma in 1993, and currently uses her inhaler once every 6-7 months.  Her most recent echo was in June 2013, and showed the following: - Left ventricle: The cavity size was normal. Wall thickness was normal. Systolic function was normal. The estimated ejection fraction was in the range of 60% to 65%. Wall motion was normal; there were no regional wall motion abnormalities. Doppler parameters are consistent with abnormal left ventricular relaxation (grade 1 diastolic dysfunction).      PMH: reviewed and listed in Problem List in Electronic Records (and see below)  Past Medical History   Diagnosis  Date   .  History of chest pain at rest      Nonexertional; relieved with nitroglycerin   .  Ejection fraction < 50%  7/08     Low risk it is in guarded left-sided EF 45%   .  Exertional dyspnea      And easy fatigability   .  Dyslipidemia    .  Diabetes mellitus      Type II   .  Hypertension    .  Lower extremity edema      Chronic   .  Femoral bruit  7/08     With normal ABIs   No past surgical history on file.  Allergies/SH/FHX : available in Electronic Records for review  Not on File  History    Social History   .  Marital Status:  Married     Spouse Name:  N/A     Number of Children:  2   .  Years of Education:  N/A      BP 145/77 Pulse 62     PHYSICAL EXAM General: NAD Neck: No JVD, no thyromegaly or thyroid nodule.  Lungs: Clear to auscultation bilaterally with normal respiratory effort. CV: Nondisplaced PMI.  Heart regular S1/S2,  no S3/S4, soft II/VI pansystolic murmur.  No peripheral edema.  No carotid bruit.  Normal pedal pulses.  Abdomen: Soft, nontender, no hepatosplenomegaly, no distention.  Neurologic: Alert and oriented x 3.  Psych: Normal affect. Extremities: No clubbing or cyanosis.     LABS: Basic Metabolic Panel: No results found for this basename: NA, K, CL, CO2, GLUCOSE, BUN, CREATININE, CALCIUM, MG, PHOS,  in the last 72 hours Liver Function Tests: No results found for this basename: AST, ALT, ALKPHOS, BILITOT, PROT, ALBUMIN,  in the last 72 hours No results found for this basename: LIPASE, AMYLASE,  in the last 72 hours CBC: No results found for this basename: WBC, NEUTROABS, HGB, HCT, MCV, PLT,  in the last 72 hours Cardiac Enzymes: No results found for this basename: CKTOTAL, CKMB, CKMBINDEX, TROPONINI,  in the last 72 hours BNP: No components found with this basename: POCBNP,  D-Dimer: No results found for this basename: DDIMER,  in the last 72 hours Hemoglobin A1C: No results found for this basename: HGBA1C,  in the last 72 hours Fasting Lipid Panel: No results found for this basename: CHOL, HDL, LDLCALC, TRIG, CHOLHDL, LDLDIRECT,  in the last 72 hours  Thyroid Function Tests: No results found for this basename: TSH, T4TOTAL, FREET3, T3FREE, THYROIDAB,  in the last 72 hours Anemia Panel: No results found for this basename: VITAMINB12, FOLATE, FERRITIN, TIBC, IRON, RETICCTPCT,  in the last 72 hours  RADIOLOGY: No results found.  Cardiac catheterization (12-09-2007):  RESULTS: The left main coronary artery and right main coronary artery  was free of significant disease.  Left anterior descending artery: The left anterior descending artery  gave rise to 2 diagonal branch and set of septal perforators. There was  30% narrowing in the mid LAD and some irregularities, but no significant  obstruction.   Circumflex artery: The circumflex artery was a moderately large vessel  gave rise to a  ramus branch, a marginal branch and 2 large  posterolateral branches. There was some irregularities, but there was  no significant obstruction.  Right coronary artery: The right coronary artery was a moderately large  vessel gave rise to a conus branch, a large right ventricle branch,  posterior descending branch and a very small posterolateral branch. The  posterolateral branch had tapered quickly, but I think there was no  obstruction there. There was no other obstruction seen.  Left ventriculogram: The left ventriculogram performed in the RAO  projection showed very mild global hypokinesis. The estimated ejection  fraction was 50%.  Distal aortogram: The distal aortogram was performed, which showed  patent renal arteries and no significant aortoiliac obstruction. There  were irregularities and with calcification.   CONCLUSION:  1. Minimal nonobstructive coronary artery disease with 30% narrowing  in the mid LAD and mild irregularities in the circumflex artery.  2. Ejection fraction 50%.  3. Patent renal arteries.  4. Mild elevation of left ventricular filling pressures.  ADDENDUM: The right atrial pressure was 9 mean. The pulmonary artery  pressure was 38/14 with a mean of 23. Pulmonary wedge pressure was 18  mean. Left ventricular pressure was 143/10. Aortic pressure was 143/59  with mean of 84. Cardiac output/cardiac index was 5.4/2.9  liters/minute/meter squared.     ASSESSMENT AND PLAN:  DIASTOLIC DYSFUNCTION  No evidence of volume overload.   HTN: I've asked her to check her BP 3-4 times a week for the next month and to inform me of the results. If they remain elevated, I would consider either increasing Chlorthalidone or adding Norvasc.  DYSPNEA  Patient has chronic dyspnea. In large part I think this is related to deconditioning, but could also be related to her asthma. I've encouraged her to use her inhaler more frequently. I also told her that this is sometimes  caused by marked elevations in blood pressure. The patient has a history of nonobstructive coronary artery disease. If her symptoms worsens she may need to be a candidate for either a stress test or PFT's.  LBBB  Chronic.      Prentice Docker, M.D., F.A.C.C.

## 2012-09-01 NOTE — Patient Instructions (Signed)
Continue all current medications. Monitor blood pressure and send readings to office Your physician wants you to follow up in:  1 year.  You will receive a reminder letter in the mail one-two months in advance.  If you don't receive a letter, please call our office to schedule the follow up appointment

## 2013-10-31 ENCOUNTER — Ambulatory Visit (INDEPENDENT_AMBULATORY_CARE_PROVIDER_SITE_OTHER): Payer: Medicare Other | Admitting: Cardiovascular Disease

## 2013-10-31 ENCOUNTER — Encounter: Payer: Self-pay | Admitting: Cardiovascular Disease

## 2013-10-31 VITALS — BP 116/70 | HR 72 | Ht 64.0 in | Wt 169.0 lb

## 2013-10-31 DIAGNOSIS — E119 Type 2 diabetes mellitus without complications: Secondary | ICD-10-CM

## 2013-10-31 DIAGNOSIS — I2510 Atherosclerotic heart disease of native coronary artery without angina pectoris: Secondary | ICD-10-CM

## 2013-10-31 DIAGNOSIS — I519 Heart disease, unspecified: Secondary | ICD-10-CM

## 2013-10-31 DIAGNOSIS — I5189 Other ill-defined heart diseases: Secondary | ICD-10-CM

## 2013-10-31 DIAGNOSIS — I447 Left bundle-branch block, unspecified: Secondary | ICD-10-CM

## 2013-10-31 DIAGNOSIS — I10 Essential (primary) hypertension: Secondary | ICD-10-CM

## 2013-10-31 NOTE — Progress Notes (Signed)
Patient ID: Penny Huynh, female   DOB: September 19, 1933, 80 y.o.   MRN: [id number]      SUBJECTIVE: The patient has a history of nonobstructive coronary disease, diastolic dysfunction, chronic dyspnea, hypertension and a left bundle branch block. The patient denies any symptoms of chest pain, palpitations, shortness of breath, lightheadedness, dizziness, leg swelling, orthopnea, PND, and syncope. She has been remarried for nearly 15 years after having been a widow for 18 years. ECG performed in the office demonstrates normal sinus rhythm.   Review of Systems: As per "subjective", otherwise negative.  No Known Allergies  Current Outpatient Prescriptions  Medication Sig Dispense Refill  . aspirin 81 MG EC tablet Take 81 mg by mouth daily.        . chlorthalidone (HYGROTON) 25 MG tablet Take 25 mg by mouth daily.       . Coenzyme Q10 (COQ10) 100 MG CAPS Take 100 mg by mouth daily.        . metFORMIN (GLUCOPHAGE) 500 MG tablet Take 500 mg by mouth 2 (two) times daily with a meal.      . Multiple Vitamins-Minerals (CENTRUM SILVER) tablet Take 1 tablet by mouth daily.        . Omega-3 Fatty Acids (FISH OIL) 1000 MG CAPS Take 1,000 mg by mouth daily.        . simvastatin (ZOCOR) 20 MG tablet Take 20 mg by mouth every evening.      . valsartan (DIOVAN) 160 MG tablet Take 160 mg by mouth daily.        . vitamin B-12 (CYANOCOBALAMIN) 500 MCG tablet Take 500 mcg by mouth daily.       No current facility-administered medications for this visit.    Past Medical History  Diagnosis Date  . History of chest pain at rest     Nonexertional; relieved with nitroglycerin  . Ejection fraction < 50% 7/08    Low risk it is in guarded left-sided EF 45%  . Exertional dyspnea     And easy fatigability  . Dyslipidemia   . Diabetes mellitus     Type II  . Hypertension   . Lower extremity edema     Chronic  . Femoral bruit 7/08    With normal ABIs    No past surgical history on file.  History    Social History  . Marital Status: Married    Spouse Name: N/A    Number of Children: 2  . Years of Education: N/A   Occupational History  . Not on file.   Social History Main Topics  . Smoking status: Former Smoker    Quit date: 02/02/1994  . Smokeless tobacco: Not on file  . Alcohol Use: Yes     Comment: Occasionally-Homemade wine- AJ  . Drug Use: No  . Sexual Activity: Not on file   Other Topics Concern  . Not on file   Social History Narrative   Lives in [place] with her husband.   Has 2 grown children.     Filed Vitals:   10/31/13 1406  BP: 116/70  Pulse: 72  Height: 5\' 4"  (1.626 m)  Weight: 169 lb (76.658 kg)  SpO2: 98%    PHYSICAL EXAM General: NAD HEENT: Normal. Neck: No JVD, no thyromegaly. Lungs: Clear to auscultation bilaterally with normal respiratory effort. CV: Nondisplaced PMI.  Regular rate and rhythm, normal S1/S2, no S3/S4, no murmur. No pretibial or periankle edema.  No carotid bruit.  Normal pedal pulses.  Abdomen:  Soft, nontender, no hepatosplenomegaly, no distention.  Neurologic: Alert and oriented x 3.  Psych: Normal affect. Skin: Normal. Musculoskeletal: Normal range of motion, no gross deformities. Extremities: No clubbing or cyanosis.   ECG: Most recent ECG reviewed.      ASSESSMENT AND PLAN: 1. Nonobstructive CAD: Symptomatically stable. Continue ASA and simvastatin. 2. Essential HTN: Controlled on chlorthalidone and valsartan. 3. Diastolic dysfunction: No evidence of volume overload. BP is well controlled. 4. Chronic dyspnea: In part related to deconditioning. Stable. 5. Type 2 diabetes: On metformin. Managed by PCP.  Dispo: f/u 1 year.  Kate Sable, M.D., F.A.C.C.

## 2013-10-31 NOTE — Patient Instructions (Signed)
Continue all current medications. Your physician wants you to follow up in:  1 year.  You will receive a reminder letter in the mail one-two months in advance.  If you don't receive a letter, please call our office to schedule the follow up appointment   

## 2014-10-04 ENCOUNTER — Ambulatory Visit: Payer: Self-pay | Admitting: Cardiovascular Disease

## 2014-10-26 ENCOUNTER — Ambulatory Visit: Payer: Self-pay | Admitting: Cardiovascular Disease

## 2014-10-29 ENCOUNTER — Encounter: Payer: Self-pay | Admitting: Cardiovascular Disease

## 2014-10-29 ENCOUNTER — Ambulatory Visit (INDEPENDENT_AMBULATORY_CARE_PROVIDER_SITE_OTHER): Payer: Medicare Other | Admitting: Cardiovascular Disease

## 2014-10-29 VITALS — BP 111/69 | HR 80 | Ht 64.56 in | Wt 160.0 lb

## 2014-10-29 DIAGNOSIS — I10 Essential (primary) hypertension: Secondary | ICD-10-CM

## 2014-10-29 DIAGNOSIS — I519 Heart disease, unspecified: Secondary | ICD-10-CM

## 2014-10-29 DIAGNOSIS — I2510 Atherosclerotic heart disease of native coronary artery without angina pectoris: Secondary | ICD-10-CM

## 2014-10-29 DIAGNOSIS — E119 Type 2 diabetes mellitus without complications: Secondary | ICD-10-CM

## 2014-10-29 DIAGNOSIS — I452 Bifascicular block: Secondary | ICD-10-CM

## 2014-10-29 DIAGNOSIS — I5189 Other ill-defined heart diseases: Secondary | ICD-10-CM

## 2014-10-29 NOTE — Patient Instructions (Signed)

## 2014-10-29 NOTE — Progress Notes (Signed)
Patient ID: Penny Huynh, female   DOB: 1933-02-12, 81 y.o.   MRN: [id number]      SUBJECTIVE: The patient presents for a routine follow-up visit. She has a history of nonobstructive coronary disease, diastolic dysfunction, chronic dyspnea, hypertension and a left bundle branch block seen in 08/2012.  She denies any symptoms of chest pain, palpitations, shortness of breath, lightheadedness, dizziness, orthopnea, PND, and syncope. She may develop mild bilateral ankle swelling if she sits for long periods of time.  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  ECG performed in the office today demonstrates normal sinus rhythm with a right bundle branch block and left anterior fascicular block. ECG in September 2015 demonstrated normal sinus rhythm with no conduction disturbances.   Review of Systems: As per "subjective", otherwise negative.  No Known Allergies  Current Outpatient Prescriptions  Medication Sig Dispense Refill  . aspirin 81 MG EC tablet Take 81 mg by mouth daily.      . chlorthalidone (HYGROTON) 25 MG tablet Take 25 mg by mouth daily.     . Coenzyme Q10 (COQ10) 100 MG CAPS Take 100 mg by mouth daily.      . metFORMIN (GLUCOPHAGE) 500 MG tablet Take 1,000 mg by mouth daily with breakfast.     . Multiple Vitamins-Minerals (CENTRUM SILVER) tablet Take 1 tablet by mouth daily.      . Omega-3 Fatty Acids (FISH OIL) 1000 MG CAPS Take 1,000 mg by mouth daily.      . simvastatin (ZOCOR) 20 MG tablet Take 20 mg by mouth every evening.    . valsartan (DIOVAN) 160 MG tablet Take 160 mg by mouth daily.      . vitamin B-12 (CYANOCOBALAMIN) 500 MCG tablet Take 500 mcg by mouth daily.     No current facility-administered medications for this visit.    Past Medical History  Diagnosis Date  . History of chest pain at rest     Nonexertional;  relieved with nitroglycerin  . Ejection fraction < 50% 7/08    Low risk it is in guarded left-sided EF 45%  . Exertional dyspnea     And easy fatigability  . Dyslipidemia   . Diabetes mellitus     Type II  . Hypertension   . Lower extremity edema     Chronic  . Femoral bruit 7/08    With normal ABIs    No past surgical history on file.  Social History   Social History  . Marital Status: Married    Spouse Name: N/A  . Number of Children: 2  . Years of Education: N/A   Occupational History  . Not on file.   Social History Main Topics  . Smoking status: Former Smoker -- 2.00 packs/day for 41 years    Types: Cigarettes    Start date: 11/09/1953    Quit date: 02/02/1994  . Smokeless tobacco: Never Used  . Alcohol Use: 0.0 oz/week    0 Standard drinks or equivalent per week     Comment: Occasionally-Homemade wine- AJ  . Drug Use: No  . Sexual Activity: Not on file   Other Topics Concern  . Not on file   Social History Narrative   Lives in [place] with her husband.   Has 2 grown children.     Filed Vitals:   10/29/14 0837  BP: 111/69  Pulse: 80  Height: 5' 4.56" (1.64 m)  Weight: 160 lb (72.576 kg)    PHYSICAL EXAM General: NAD HEENT: Normal. Neck: No JVD, no thyromegaly. Lungs: Clear to auscultation bilaterally with normal respiratory effort. CV: Nondisplaced PMI.  Regular rate and rhythm, normal S1/S2, no S3/S4, no murmur. No pretibial or periankle edema.  No carotid bruit.  Normal pedal pulses.  Abdomen: Soft, nontender, no distention.  Neurologic: Alert and oriented x 3.  Psych: Normal affect. Skin: Normal. Musculoskeletal: Normal range of motion, no gross deformities. Extremities: No clubbing or cyanosis.   ECG: Most recent ECG reviewed.      ASSESSMENT AND PLAN: 1. Nonobstructive CAD: Symptomatically stable. Continue ASA and simvastatin.  2. Essential HTN: Controlled on chlorthalidone and valsartan.  3. Diastolic dysfunction: No evidence of  volume overload. BP is well controlled.  4. Chronic dyspnea: Stable.  5. Type 2 diabetes: On metformin. Has some bilateral feet neuropathy. Managed by PCP.  Dispo: f/u 1 year.   Kate Sable, M.D., F.A.C.C.

## 2015-05-24 DIAGNOSIS — E782 Mixed hyperlipidemia: Secondary | ICD-10-CM | POA: Diagnosis not present

## 2015-05-24 DIAGNOSIS — E1140 Type 2 diabetes mellitus with diabetic neuropathy, unspecified: Secondary | ICD-10-CM | POA: Diagnosis not present

## 2015-05-24 DIAGNOSIS — E876 Hypokalemia: Secondary | ICD-10-CM | POA: Diagnosis not present

## 2015-05-24 DIAGNOSIS — E7800 Pure hypercholesterolemia, unspecified: Secondary | ICD-10-CM | POA: Diagnosis not present

## 2015-05-24 DIAGNOSIS — I10 Essential (primary) hypertension: Secondary | ICD-10-CM | POA: Diagnosis not present

## 2015-05-30 DIAGNOSIS — E1140 Type 2 diabetes mellitus with diabetic neuropathy, unspecified: Secondary | ICD-10-CM | POA: Diagnosis not present

## 2015-05-30 DIAGNOSIS — E1165 Type 2 diabetes mellitus with hyperglycemia: Secondary | ICD-10-CM | POA: Diagnosis not present

## 2015-05-30 DIAGNOSIS — R252 Cramp and spasm: Secondary | ICD-10-CM | POA: Diagnosis not present

## 2015-05-30 DIAGNOSIS — E782 Mixed hyperlipidemia: Secondary | ICD-10-CM | POA: Diagnosis not present

## 2015-05-30 DIAGNOSIS — I10 Essential (primary) hypertension: Secondary | ICD-10-CM | POA: Diagnosis not present

## 2015-07-08 DIAGNOSIS — R05 Cough: Secondary | ICD-10-CM | POA: Diagnosis not present

## 2015-07-17 DIAGNOSIS — H8110 Benign paroxysmal vertigo, unspecified ear: Secondary | ICD-10-CM | POA: Insufficient documentation

## 2015-07-18 ENCOUNTER — Emergency Department (HOSPITAL_COMMUNITY): Payer: Medicare Other

## 2015-07-18 ENCOUNTER — Observation Stay (HOSPITAL_COMMUNITY)
Admission: EM | Admit: 2015-07-18 | Discharge: 2015-07-19 | Disposition: A | Discharge status: Home / Self Care | Payer: Medicare Other | Attending: Internal Medicine | Admitting: Internal Medicine

## 2015-07-18 ENCOUNTER — Encounter (HOSPITAL_COMMUNITY): Payer: Self-pay

## 2015-07-18 DIAGNOSIS — I10 Essential (primary) hypertension: Secondary | ICD-10-CM | POA: Insufficient documentation

## 2015-07-18 DIAGNOSIS — I200 Unstable angina: Secondary | ICD-10-CM | POA: Diagnosis not present

## 2015-07-18 DIAGNOSIS — R42 Dizziness and giddiness: Secondary | ICD-10-CM | POA: Insufficient documentation

## 2015-07-18 DIAGNOSIS — E1165 Type 2 diabetes mellitus with hyperglycemia: Secondary | ICD-10-CM | POA: Diagnosis not present

## 2015-07-18 DIAGNOSIS — Z7984 Long term (current) use of oral hypoglycemic drugs: Secondary | ICD-10-CM | POA: Insufficient documentation

## 2015-07-18 DIAGNOSIS — R0789 Other chest pain: Principal | ICD-10-CM | POA: Insufficient documentation

## 2015-07-18 DIAGNOSIS — E876 Hypokalemia: Secondary | ICD-10-CM | POA: Diagnosis not present

## 2015-07-18 DIAGNOSIS — Z79899 Other long term (current) drug therapy: Secondary | ICD-10-CM | POA: Insufficient documentation

## 2015-07-18 DIAGNOSIS — R072 Precordial pain: Secondary | ICD-10-CM | POA: Diagnosis not present

## 2015-07-18 DIAGNOSIS — E782 Mixed hyperlipidemia: Secondary | ICD-10-CM | POA: Diagnosis not present

## 2015-07-18 DIAGNOSIS — Z87891 Personal history of nicotine dependence: Secondary | ICD-10-CM | POA: Insufficient documentation

## 2015-07-18 DIAGNOSIS — E785 Hyperlipidemia, unspecified: Secondary | ICD-10-CM | POA: Insufficient documentation

## 2015-07-18 DIAGNOSIS — R079 Chest pain, unspecified: Secondary | ICD-10-CM

## 2015-07-18 DIAGNOSIS — Z7982 Long term (current) use of aspirin: Secondary | ICD-10-CM | POA: Insufficient documentation

## 2015-07-18 DIAGNOSIS — H8110 Benign paroxysmal vertigo, unspecified ear: Secondary | ICD-10-CM | POA: Diagnosis not present

## 2015-07-18 HISTORY — DX: Atherosclerotic heart disease of native coronary artery without angina pectoris: I25.10

## 2015-07-18 LAB — CBC WITH DIFFERENTIAL/PLATELET
Basophils Absolute: 0 10*3/uL (ref 0.0–0.1)
Basophils Relative: 1 %
Eosinophils Absolute: 0.1 10*3/uL (ref 0.0–0.7)
Eosinophils Relative: 3 %
HEMATOCRIT: 38 % (ref 36.0–46.0)
HEMOGLOBIN: 12.6 g/dL (ref 12.0–15.0)
LYMPHS ABS: 1.8 10*3/uL (ref 0.7–4.0)
Lymphocytes Relative: 44 %
MCH: 30.7 pg (ref 26.0–34.0)
MCHC: 33.2 g/dL (ref 30.0–36.0)
MCV: 92.7 fL (ref 78.0–100.0)
MONOS PCT: 8 %
Monocytes Absolute: 0.3 10*3/uL (ref 0.1–1.0)
NEUTROS ABS: 1.9 10*3/uL (ref 1.7–7.7)
NEUTROS PCT: 44 %
Platelets: 216 10*3/uL (ref 150–400)
RBC: 4.1 MIL/uL (ref 3.87–5.11)
RDW: 13.4 % (ref 11.5–15.5)
WBC: 4.2 10*3/uL (ref 4.0–10.5)

## 2015-07-18 LAB — BASIC METABOLIC PANEL
Anion gap: 7 (ref 5–15)
BUN: 13 mg/dL (ref 6–20)
CHLORIDE: 97 mmol/L — AB (ref 101–111)
CO2: 27 mmol/L (ref 22–32)
CREATININE: 0.78 mg/dL (ref 0.44–1.00)
Calcium: 9.1 mg/dL (ref 8.9–10.3)
GFR calc non Af Amer: >60 mL/min (ref >60)
Glucose, Bld: 102 mg/dL — ABNORMAL HIGH (ref 65–99)
POTASSIUM: 3.1 mmol/L — AB (ref 3.5–5.1)
SODIUM: 131 mmol/L — AB (ref 135–145)

## 2015-07-18 LAB — TROPONIN I

## 2015-07-18 MED ORDER — ENOXAPARIN SODIUM 40 MG/0.4ML ~~LOC~~ SOLN
40.0000 mg | SUBCUTANEOUS | Status: DC
  Filled 2015-07-18: qty 0.4

## 2015-07-18 MED ORDER — ONDANSETRON HCL 4 MG/2ML IJ SOLN: 4.0000 mg | Freq: Four times a day (QID) | INTRAMUSCULAR | Status: DC | PRN

## 2015-07-18 MED ORDER — IRBESARTAN 150 MG PO TABS
150.0000 mg | ORAL_TABLET | Freq: Every day | ORAL | Status: DC
  Administered 2015-07-19: 150 mg via ORAL
  Filled 2015-07-18: qty 1

## 2015-07-18 MED ORDER — CHLORTHALIDONE 25 MG PO TABS
25.0000 mg | ORAL_TABLET | Freq: Every day | ORAL | Status: DC
  Administered 2015-07-19: 25 mg via ORAL
  Filled 2015-07-18 (×4): qty 1

## 2015-07-18 MED ORDER — MORPHINE SULFATE (PF) 2 MG/ML IV SOLN: 2.0000 mg | INTRAVENOUS | Status: DC | PRN

## 2015-07-18 MED ORDER — ASPIRIN EC 81 MG PO TBEC
81.0000 mg | DELAYED_RELEASE_TABLET | Freq: Every day | ORAL | Status: DC
Start: 2015-07-18 — End: 2015-07-19
  Administered 2015-07-18 – 2015-07-19 (×2): 81 mg via ORAL
  Filled 2015-07-18 (×2): qty 1

## 2015-07-18 MED ORDER — POTASSIUM CHLORIDE CRYS ER 20 MEQ PO TBCR
30.0000 meq | EXTENDED_RELEASE_TABLET | Freq: Once | ORAL | Status: AC
  Administered 2015-07-18: 30 meq via ORAL
  Filled 2015-07-18: qty 1

## 2015-07-18 MED ORDER — GI COCKTAIL ~~LOC~~: 30.0000 mL | Freq: Four times a day (QID) | ORAL | Status: DC | PRN

## 2015-07-18 MED ORDER — ACETAMINOPHEN 325 MG PO TABS: 650.0000 mg | ORAL_TABLET | ORAL | Status: DC | PRN

## 2015-07-18 MED ORDER — METFORMIN HCL 500 MG PO TABS: 1000.0000 mg | ORAL_TABLET | Freq: Every day | ORAL | Status: DC

## 2015-07-18 MED ORDER — SIMVASTATIN 20 MG PO TABS
40.0000 mg | ORAL_TABLET | Freq: Every evening | ORAL | Status: DC
  Administered 2015-07-18: 40 mg via ORAL
  Filled 2015-07-18: qty 2

## 2015-07-18 NOTE — ED Provider Notes (Signed)
CSN: [id number]     Arrival date & time 07/18/15  1256 History   First MD Initiated Contact with Patient 07/18/15 1308     Chief Complaint  Patient presents with  . Chest Pain  . Dizziness     (Consider location/radiation/quality/duration/timing/severity/associated sxs/prior Treatment) HPI Comments: Patient from PCPs office with a 2 day history of intermittent chest pain. It is central in her chest lasting for less than 10 minutes at a time. It is not radiate to her back, neck or arm. There is no shortness of breath. There is some nausea with the chest pain. There is no diaphoresis. It is not exertional. It is better with belching and nothing makes it worse. Her PCP was concerned about her EKG so he sent her to the ED. Patient denies any cardiac history. No history of stents. She does have diabetes and hypertension. Last stress test was several years ago. Patient also has had intermittent room spinning dizziness for the past several days that is better with sitting down. No focal weakness, numbness or tingling. No vision changes. No headache. No difficulty walking or speaking.  The history is provided by the patient and a relative.    Past Medical History  Diagnosis Date  . History of chest pain at rest     Nonexertional; relieved with nitroglycerin  . Ejection fraction < 50% 7/08    Low risk it is in guarded left-sided EF 45%  . Exertional dyspnea     And easy fatigability  . Dyslipidemia   . Diabetes mellitus     Type II  . Hypertension   . Lower extremity edema     Chronic  . Femoral bruit 7/08    With normal ABIs   History reviewed. No pertinent past surgical history. Family History  Problem Relation Age of Onset  . Diabetes Mother   . Stroke Father    Social History  Substance Use Topics  . Smoking status: Former Smoker -- 2.00 packs/day for 41 years    Types: Cigarettes    Start date: 11/09/1953    Quit date: 02/02/1994  . Smokeless tobacco: Never Used  . Alcohol  Use: 0.0 oz/week    0 Standard drinks or equivalent per week     Comment: Occasionally-Homemade wine- AJ   OB History    No data available     Review of Systems  Constitutional: Negative for fever, activity change and appetite change.  HENT: Negative for congestion and rhinorrhea.   Respiratory: Positive for chest tightness. Negative for cough and shortness of breath.   Cardiovascular: Negative for chest pain and leg swelling.  Gastrointestinal: Negative for nausea, vomiting and abdominal pain.  Genitourinary: Negative for dysuria, hematuria, vaginal bleeding and vaginal discharge.  Musculoskeletal: Negative for myalgias and arthralgias.  Skin: Negative for rash.  Neurological: Positive for dizziness and light-headedness. Negative for weakness and headaches.  A complete 10 system review of systems was obtained and all systems are negative except as noted in the HPI and PMH.      Allergies  Review of patient's allergies indicates no known allergies.  Home Medications   Prior to Admission medications   Medication Sig Start Date End Date Taking? Authorizing Provider  aspirin 81 MG EC tablet Take 81 mg by mouth daily.     Yes Historical Provider, MD  chlorthalidone (HYGROTON) 25 MG tablet Take 25 mg by mouth daily.    Yes Historical Provider, MD  Coenzyme Q10 (COQ10) 100 MG CAPS Take 100  mg by mouth daily.     Yes Historical Provider, MD  metFORMIN (GLUCOPHAGE) 500 MG tablet Take 1,000 mg by mouth daily with breakfast.    Yes Historical Provider, MD  simvastatin (ZOCOR) 20 MG tablet Take 40 mg by mouth every evening.    Yes Historical Provider, MD  valsartan (DIOVAN) 160 MG tablet Take 160 mg by mouth daily.     Yes Historical Provider, MD  vitamin B-12 (CYANOCOBALAMIN) 500 MCG tablet Take 500 mcg by mouth daily.   Yes Historical Provider, MD   BP 153/71 mmHg  Pulse 60  Temp(Src) 98.3 F (36.8 C) (Oral)  Resp 18  Ht 5\' 4"  (1.626 m)  Wt 162 lb (73.483 kg)  BMI 27.79 kg/m2   SpO2 100% Physical Exam  Constitutional: She is oriented to person, place, and time. She appears well-developed and well-nourished. No distress.  HENT:  Head: Normocephalic and atraumatic.  Mouth/Throat: Oropharynx is clear and moist. No oropharyngeal exudate.  Eyes: Conjunctivae and EOM are normal. Pupils are equal, round, and reactive to light.  Neck: Normal range of motion. Neck supple.  No meningismus.  Cardiovascular: Normal rate, regular rhythm, normal heart sounds and intact distal pulses.   No murmur heard. Pulmonary/Chest: Effort normal and breath sounds normal. No respiratory distress. She exhibits no tenderness.  Abdominal: Soft. There is no tenderness. There is no rebound and no guarding.  Musculoskeletal: Normal range of motion. She exhibits no edema or tenderness.  Neurological: She is alert and oriented to person, place, and time. No cranial nerve deficit. She exhibits normal muscle tone. Coordination normal.  No ataxia on finger to nose bilaterally. No pronator drift. 5/5 strength throughout. CN 2-12 intact.Equal grip strength. Sensation intact. No nystagmus, negative Romberg, normal gait  Skin: Skin is warm.  Psychiatric: She has a normal mood and affect. Her behavior is normal.  Nursing note and vitals reviewed.   ED Course  Procedures (including critical care time) Labs Review Labs Reviewed  BASIC METABOLIC PANEL - Abnormal; Notable for the following:    Sodium 131 (*)    Potassium 3.1 (*)    Chloride 97 (*)    Glucose, Bld 102 (*)    All other components within normal limits  CBC WITH DIFFERENTIAL/PLATELET  TROPONIN I  TROPONIN I  TROPONIN I  TROPONIN I    Imaging Review Dg Chest Portable 1 View  07/18/2015  CLINICAL DATA:  Dizziness and chest pain for 4 days EXAM: PORTABLE CHEST 1 VIEW COMPARISON:  None. FINDINGS: The heart size and mediastinal contours are within normal limits. Both lungs are clear. The visualized skeletal structures are unremarkable.  IMPRESSION: No active disease. Electronically Signed   By: Inez Catalina M.D.   On: 07/18/2015 13:32   I have personally reviewed and evaluated these images and lab results as part of my medical decision-making.   EKG Interpretation   Date/Time:  Thursday July 18 2015 13:12:56 EDT Ventricular Rate:  66 PR Interval:  173 QRS Duration: 143 QT Interval:  439 QTC Calculation: 460 R Axis:   -30 Text Interpretation:  Sinus rhythm Right bundle branch block No previous  ECGs available Confirmed by Abdishakur Gottschall  MD, Robina Hamor [phone]) on 07/18/2015  1:28:47 PM      MDM   Final diagnoses:  Chest pain, unspecified chest pain type   Intermittent central chest pain for the past 2 days not exertional. No radiation. Associated with nausea. ASA given.  EKG is right bundle-branch block, no comparison. No acute ST changes.  Troponin is negative. CXR negative. No episodes of chest pain while in the ED.  Heart score is 5. Plan admission for rule out. D/w Dr. Nehemiah Settle.  Ezequiel Essex, MD 07/18/15 2138

## 2015-07-18 NOTE — ED Notes (Signed)
Pt c/o dizziness and chest pain off and on since Monday.  Reports saw Dr. Quintin Alto today and was sent to er  for evaluation.

## 2015-07-18 NOTE — ED Notes (Signed)
MD at bedside. 

## 2015-07-18 NOTE — H&P (Signed)
History and Physical  Penny Huynh [id number] DOB: 17-Mar-1933 DOA: 07/18/2015  Referring physician: Dr Wyvonnia Dusky, ED physician PCP: Manon Hilding, MD  Outpatient Specialists:   Dr Bronson Ing  Chief Complaint: Chest tightness  HPI: Penny Huynh is a 82 y.o. female with a history of type 2 diabetes, hypertension, systolic heart failure with an EF of 45%, hyperlipidemia. She presents to the emergency department with onset of chest pain last night which improved after belching. She continued to have residual chest tightness which is present this morning and in the emergency department. No other palliating or provoking factors. She did have some nausea when she was having the chest pain, but denies diaphoresis, radiation of the pain, shortness of breath.  She did have an onset of vertigo that occurred on Monday. She no longer is vertiginous, but continues to have some feelings of disequilibrium  Review of Systems:    Pt denies any fevers, chills, nausea, vomiting, diarrhea, constipation, abdominal pain, shortness of breath, dyspnea on exertion, orthopnea, cough, wheezing, palpitations, headache, vision changes, lightheadedness, dizziness, melena, rectal bleeding.  Review of systems are otherwise negative  Past Medical History  Diagnosis Date  . History of chest pain at rest     Nonexertional; relieved with nitroglycerin  . Ejection fraction < 50% 7/08    Low risk it is in guarded left-sided EF 45%  . Exertional dyspnea     And easy fatigability  . Dyslipidemia   . Diabetes mellitus     Type II  . Hypertension   . Lower extremity edema     Chronic  . Femoral bruit 7/08    With normal ABIs   History reviewed. No pertinent past surgical history. Social History:  reports that she quit smoking about 21 years ago. Her smoking use included Cigarettes. She started smoking about 61 years ago. She has a 82 pack-year smoking history. She has never used smokeless tobacco. She  reports that she drinks alcohol. She reports that she does not use illicit drugs. Patient lives at Home  No Known Allergies  Family History  Problem Relation Age of Onset  . Diabetes Mother   . Stroke Father      Prior to Admission medications   Medication Sig Start Date End Date Taking? Authorizing Provider  aspirin 81 MG EC tablet Take 81 mg by mouth daily.     Yes Historical Provider, MD  chlorthalidone (HYGROTON) 25 MG tablet Take 25 mg by mouth daily.    Yes Historical Provider, MD  Coenzyme Q10 (COQ10) 100 MG CAPS Take 100 mg by mouth daily.     Yes Historical Provider, MD  metFORMIN (GLUCOPHAGE) 500 MG tablet Take 1,000 mg by mouth daily with breakfast.    Yes Historical Provider, MD  simvastatin (ZOCOR) 20 MG tablet Take 40 mg by mouth every evening.    Yes Historical Provider, MD  valsartan (DIOVAN) 160 MG tablet Take 160 mg by mouth daily.     Yes Historical Provider, MD  vitamin B-12 (CYANOCOBALAMIN) 500 MCG tablet Take 500 mcg by mouth daily.   Yes Historical Provider, MD    Physical Exam: BP 153/71 mmHg  Pulse 60  Temp(Src) 98.3 F (36.8 C) (Oral)  Resp 18  Ht 5\' 4"  (1.626 m)  Wt 73.483 kg (162 lb)  BMI 27.79 kg/m2  SpO2 100%  General: Older black female who appears jaundiced than stated age. Awake and alert and oriented x3. No acute cardiopulmonary distress.  HEENT: Normocephalic atraumatic.  Right  and left ears normal in appearance.  Pupils equal, round, reactive to light. Extraocular muscles are intact. Sclerae anicteric and noninjected.  Moist mucosal membranes. No mucosal lesions.  Neck: Neck supple without lymphadenopathy. No carotid bruits. No masses palpated.  Cardiovascular: Regular rate with normal S1-S2 sounds. No murmurs, rubs, gallops auscultated. No JVD.  Respiratory: Good respiratory effort with no wheezes, rales, rhonchi. Lungs clear to auscultation bilaterally.  No accessory muscle use. Abdomen: Soft, nontender, nondistended. Active bowel sounds. No  masses or hepatosplenomegaly  Skin: No rashes, lesions, or ulcerations.  Dry, warm to touch. 2+ dorsalis pedis and radial pulses. Musculoskeletal: No calf or leg pain. All major joints not erythematous nontender.  No upper or lower joint deformation.  Good ROM.  No contractures  Psychiatric: Intact judgment and insight. Pleasant and cooperative. Neurologic: No focal neurological deficits. Strength is 5/5 and symmetric in upper and lower extremities.  Cranial nerves II through XII are grossly intact.           Labs on Admission: I have personally reviewed following labs and imaging studies  CBC:  Recent Labs Lab 07/18/15 1358  WBC 4.2  NEUTROABS 1.9  HGB 12.6  HCT 38.0  MCV 92.7  PLT 123XX123   Basic Metabolic Panel:  Recent Labs Lab 07/18/15 1358  NA 131*  K 3.1*  CL 97*  CO2 27  GLUCOSE 102*  BUN 13  CREATININE 0.78  CALCIUM 9.1   GFR: Estimated Creatinine Clearance: 56 mL/min (by C-G formula based on Cr of 0.78). Liver Function Tests: No results for input(s): AST, ALT, ALKPHOS, BILITOT, PROT, ALBUMIN in the last 168 hours. No results for input(s): LIPASE, AMYLASE in the last 168 hours. No results for input(s): AMMONIA in the last 168 hours. Coagulation Profile: No results for input(s): INR, PROTIME in the last 168 hours. Cardiac Enzymes:  Recent Labs Lab 07/18/15 1358  TROPONINI <0.03   BNP (last 3 results) No results for input(s): PROBNP in the last 8760 hours. HbA1C: No results for input(s): HGBA1C in the last 72 hours. CBG: No results for input(s): GLUCAP in the last 168 hours. Lipid Profile: No results for input(s): CHOL, HDL, LDLCALC, TRIG, CHOLHDL, LDLDIRECT in the last 72 hours. Thyroid Function Tests: No results for input(s): TSH, T4TOTAL, FREET4, T3FREE, THYROIDAB in the last 72 hours. Anemia Panel: No results for input(s): VITAMINB12, FOLATE, FERRITIN, TIBC, IRON, RETICCTPCT in the last 72 hours. Urine analysis: No results found for: COLORURINE,  APPEARANCEUR, LABSPEC, PHURINE, GLUCOSEU, HGBUR, BILIRUBINUR, KETONESUR, PROTEINUR, UROBILINOGEN, NITRITE, LEUKOCYTESUR Sepsis Labs: @LABRCNTIP (procalcitonin:4,lacticidven:4) )No results found for this or any previous visit (from the past 240 hour(s)).   Radiological Exams on Admission: Dg Chest Portable 1 View  07/18/2015  CLINICAL DATA:  Dizziness and chest pain for 4 days EXAM: PORTABLE CHEST 1 VIEW COMPARISON:  None. FINDINGS: The heart size and mediastinal contours are within normal limits. Both lungs are clear. The visualized skeletal structures are unremarkable. IMPRESSION: No active disease. Electronically Signed   By: Inez Catalina M.D.   On: 07/18/2015 13:32    EKG: Independently reviewed. Sinus rhythm with right bundle branch block. No acute ST elevation or depression  Assessment/Plan: Principal Problem:   Chest pain Active Problems:   Essential hypertension, benign   Hypokalemia   Vertigo    This patient was discussed with the ED physician, including pertinent vitals, physical exam findings, labs, and imaging.  We also discussed care given by the ED provider.  #1 chest pain  Observation on telemetry for  chest pain rule out  Repeat serial enzymes #2 hypertension  Continue home medications #3 hypokalemia  Potassium 30 mEq now #4 vertigo  Consult PT  DVT prophylaxis: Lovenox Consultants: None Code Status: Full code Family Communication: None  Disposition Plan: Observation   Truett Mainland, DO [facility] Pager [phone]  If 7PM-7AM, please contact night-coverage [email or web address] Password [email or web address]

## 2015-07-19 ENCOUNTER — Other Ambulatory Visit: Payer: Self-pay | Admitting: Adult Health

## 2015-07-19 ENCOUNTER — Encounter (HOSPITAL_COMMUNITY): Payer: Self-pay | Admitting: Cardiology

## 2015-07-19 DIAGNOSIS — R079 Chest pain, unspecified: Secondary | ICD-10-CM | POA: Diagnosis not present

## 2015-07-19 DIAGNOSIS — R42 Dizziness and giddiness: Secondary | ICD-10-CM

## 2015-07-19 DIAGNOSIS — R0789 Other chest pain: Secondary | ICD-10-CM | POA: Diagnosis not present

## 2015-07-19 DIAGNOSIS — E876 Hypokalemia: Secondary | ICD-10-CM | POA: Diagnosis not present

## 2015-07-19 DIAGNOSIS — E785 Hyperlipidemia, unspecified: Secondary | ICD-10-CM | POA: Diagnosis not present

## 2015-07-19 DIAGNOSIS — I2510 Atherosclerotic heart disease of native coronary artery without angina pectoris: Secondary | ICD-10-CM

## 2015-07-19 DIAGNOSIS — I10 Essential (primary) hypertension: Secondary | ICD-10-CM

## 2015-07-19 DIAGNOSIS — I25119 Atherosclerotic heart disease of native coronary artery with unspecified angina pectoris: Secondary | ICD-10-CM

## 2015-07-19 LAB — TROPONIN I
Troponin I: <0.03 ng/mL (ref <0.031)
Troponin I: <0.03 ng/mL (ref <0.031)
Troponin I: <0.03 ng/mL (ref <0.031)

## 2015-07-19 LAB — GLUCOSE, CAPILLARY
GLUCOSE-CAPILLARY: 202 mg/dL — AB (ref 65–99)
GLUCOSE-CAPILLARY: 161 mg/dL — AB (ref 65–99)

## 2015-07-19 MED ORDER — NITROGLYCERIN 0.4 MG SL SUBL
SUBLINGUAL_TABLET | SUBLINGUAL | Status: AC
  Filled 2015-07-19: qty 1

## 2015-07-19 MED ORDER — INSULIN ASPART 100 UNIT/ML ~~LOC~~ SOLN
0.0000 [IU] | Freq: Every day | SUBCUTANEOUS | Status: DC
Start: 2015-07-19 — End: 2015-07-19

## 2015-07-19 MED ORDER — MECLIZINE HCL 12.5 MG PO TABS
12.5000 mg | ORAL_TABLET | Freq: Three times a day (TID) | ORAL | Status: DC
  Administered 2015-07-19: 12.5 mg via ORAL
  Filled 2015-07-19: qty 1

## 2015-07-19 MED ORDER — INSULIN ASPART 100 UNIT/ML ~~LOC~~ SOLN: 0.0000 [IU] | Freq: Three times a day (TID) | SUBCUTANEOUS | Status: DC

## 2015-07-19 MED ORDER — POTASSIUM CHLORIDE CRYS ER 10 MEQ PO TBCR: 10.0000 meq | EXTENDED_RELEASE_TABLET | Freq: Every day | ORAL | Status: DC

## 2015-07-19 MED ORDER — POTASSIUM CHLORIDE CRYS ER 10 MEQ PO TBCR
10.0000 meq | EXTENDED_RELEASE_TABLET | Freq: Every day | ORAL | Status: DC
Start: 2015-07-19 — End: 2015-07-19
  Administered 2015-07-19: 10 meq via ORAL
  Filled 2015-07-19: qty 1

## 2015-07-19 MED ORDER — MECLIZINE HCL 12.5 MG PO TABS: 12.5000 mg | ORAL_TABLET | Freq: Three times a day (TID) | ORAL | Status: DC | PRN

## 2015-07-19 NOTE — Consult Note (Signed)
CARDIOLOGY CONSULT NOTE   Patient ID: MI CHAP MRN: [id number] DOB/AGE: Jan 14, 1934 82 y.o.  Admit Date: 07/18/2015 Referring Physician: TRH-Elgergawy MD Primary Physician: Manon Hilding, MD Consulting Cardiologist: Rozann Lesches MD Primary Cardiologist: Kate Sable MD Reason for Consultation: Chest Pain   Clinical Summary Ms. Rusher is a 82 y.o.female with known history of mild non-obstructive CAD as of 2009, RBBB, hypertension, diastolic dysfunction, and chronic dyspnea. Last cardiac cath 2009 with 30% narrowing in mid LAD and mild irregularities in the circumflex artery. EF of 50%. Normally sees Dr. Bronson Ing in the [place] office.  She was in her usual state of health until about a month ago, when she got back from a tour, riding a bus. She had a chest cold with coughing after coming home and was placed on antibiotics and steroids taper pack. She took her last dose Sunday and began to have profound dizziness the following day.  She began to have chest discomfort the day after. She made appointment with PCP, Dr. Quintin Alto, who saw her on Thursday. Due to symptoms of dizziness and chest discomfort, she was sent to ER.   She presents to the ER with complaints of chest pain. BP 110/77 HR 71, O2 Sat 98% afebrile. Pertinent labs revealed hypokalemia with potassium of 3.1, Na of 131. EKG -NSR with RBBB. She was admitted to r/o ACS. Troponin has been negative X 4. Potassium has been replaced.  .  Currently chest pain has subsided. She continues dizziness while in the bed and when up to the bathroom. She states it comes in waves. Dr. Quintin Alto ordered her ant-vertigo medications but did came to ER before filling.   No Known Allergies  Medications Scheduled Medications: . aspirin EC  81 mg Oral Daily  . chlorthalidone  25 mg Oral Daily  . enoxaparin (LOVENOX) injection  40 mg Subcutaneous Q24H  . insulin aspart  0-5 Units Subcutaneous QHS  . insulin aspart  0-9 Units  Subcutaneous TID WC  . irbesartan  150 mg Oral Daily  . meclizine  12.5 mg Oral TID  . nitroGLYCERIN      . potassium chloride  10 mEq Oral Daily  . simvastatin  40 mg Oral QPM    PRN Medications: acetaminophen, gi cocktail, morphine injection, ondansetron (ZOFRAN) IV   Past Medical History  Diagnosis Date  . Coronary atherosclerosis     Minor nonobstructive CAD at cardiac catheterization 2009  . Dyslipidemia   . Type 2 diabetes mellitus ([place])   . Hypertension   . Lower extremity edema     Chronic  . Femoral bruit 7/08    With normal ABIs    History reviewed. No pertinent past surgical history.  Family History  Problem Relation Age of Onset  . Diabetes Mother   . Stroke Father     Social History Ms. Bedrosian reports that she quit smoking about 21 years ago. Her smoking use included Cigarettes. She started smoking about 61 years ago. She has a 82 pack-year smoking history. She has never used smokeless tobacco. Ms. Buist reports that she drinks alcohol.  Review of Systems Complete review of systems are found to be negative unless outlined in H&P above. Dizziness as outlined. No syncope.  Physical Examination Blood pressure 142/72, pulse 62, temperature 98.4 F (36.9 C), temperature source Oral, resp. rate 20, height 5\' 4"  (1.626 m), weight 162 lb (73.483 kg), SpO2 98 %.  Intake/Output Summary (Last 24 hours) at 07/19/15 1517 Last data filed at 07/19/15 0900  Gross per 24 hour  Intake    120 ml  Output      0 ml  Net    120 ml    Telemetry: NSR, rates in the  70's. No pauses or arrhythmia   GEN: No acute distress  HEENT: Conjunctiva and lids normal, oropharynx clear with moist mucosa. Neck: Supple, no elevated JVP no carotid bruits, no thyromegaly. Lungs: Clear to auscultation, nonlabored breathing at rest. Cardiac: Regular rate and rhythm, no S3 or significant systolic murmur, no pericardial rub. Abdomen: Soft, nontender, no hepatomegaly, bowel sounds  present, no guarding or rebound. Extremities: No pitting edema, distal pulses 2+. Skin: Warm and dry. Musculoskeletal: No kyphosis. Neuropsychiatric: Alert and oriented x3, affect grossly appropriate.  Prior Cardiac Testing/Procedures  Echocardiogram 07/25/2011.  Left ventricle: The cavity size was normal. Wall thickness was normal. Systolic function was normal. The estimated ejection fraction was in the range of 60% to 65%. Wall motion was normal; there were no regional wall motion abnormalities. Doppler parameters are consistent with abnormal left ventricular relaxation (grade 1 diastolic dysfunction). - Aortic valve: Valve area: 2.35cm^2(VTI). Valve area: 2.34cm^2 (Vmax). - Atrial septum: No defect or patent foramen ovale was identified.  Cardiac Cath 06/04/2007 CONCLUSION: 1. Minimal nonobstructive coronary artery disease with 30% narrowing  in the mid LAD and mild irregularities in the circumflex artery. 2. Ejection fraction 50%. 3. Patent renal arteries. 4. Mild elevation of left ventricular filling pressures.  Lab Results  Basic Metabolic Panel:  Recent Labs Lab 07/18/15 1358  NA 131*  K 3.1*  CL 97*  CO2 27  GLUCOSE 102*  BUN 13  CREATININE 0.78  CALCIUM 9.1    CBC:  Recent Labs Lab 07/18/15 1358  WBC 4.2  NEUTROABS 1.9  HGB 12.6  HCT 38.0  MCV 92.7  PLT 216    Cardiac Enzymes:  Recent Labs Lab 07/18/15 1358 07/18/15 2055 07/19/15 0022 07/19/15 0659 07/19/15 1316  TROPONINI <0.03 <0.03 <0.03 <0.03 <0.03    Radiology: Dg Chest Portable 1 View  07/18/2015  CLINICAL DATA:  Dizziness and chest pain for 4 days EXAM: PORTABLE CHEST 1 VIEW COMPARISON:  None. FINDINGS: The heart size and mediastinal contours are within normal limits. Both lungs are clear. The visualized skeletal structures are unremarkable. IMPRESSION: No active disease. Electronically Signed   By: Inez Catalina M.D.   On: 07/18/2015 13:32    ECG: Sinus  rhythm with right bundle branch block and no acute ST segment changes.  Impression and Recommendations  1. Chest Pain: Troponin negative X 4 without acute ST.T waves changes, arguing against ACS. She was hypokalemic on admission. Takes chlorthalidone but is not on potassium replacement at home. Will need to have low dose potassium daily with OP lab follow up.   2. Hypertension: On Diovan at home. BP is controlled.   3. CAD: Non-obstructive per cardiac cath in 2009. Can consider OP stress test if symptoms persist. She is pain free now. Continue ASA and statin.   4. Dizziness: After being antibiotics and steroids. No carotid bruits appreciated. May benefit from [place]. Watch tele for bradycardia or pauses Chest orthostatics   5. Hypercholesterolemia: Continue statin therapy.   Signed: Phill Myron. Lawrence NP [place]  07/19/2015, 3:17 PM Co-Sign MD   Attending note:  Patient seen and examined. Reviewed records and updated the chart. Modified above note by Ms. Lawrence NP. Ms. Walsworth presents to the hospital with symptoms of chest discomfort and also recent dizziness. She had had a recent URI  with cough, treated with antibiotics and steroids, recently completed her dose. Seen by her PCP and referred to ER. Under observation she has had some recurrent atypical chest discomfort, but cardiac enzymes have been completely normal, and ECG shows no acute ST segment changes with old right bundle-branch block. Chest x-ray shows no acute process.  On examination this afternoon she is comfortable, just returned from a walk in the hall with PT, no chest pain or unusual shortness of breath. Heart rate in the 0000000 and systolic blood pressure [id number]. Lungs are clear without labored breathing, cardiac exam with RRR no gallop. Troponin I level negative 5.  Patient with atypical chest pain, currently resolved. No definite ACS by cardiac enzymes and ECG stable. She has a history of mild nonobstructive CAD by cardiac  catheterization in 2009. She has not undergone any recent ischemic evaluations. Anticipate discharge home with recommendation to follow-up for outpatient exercise Cardiolite and then see Dr. Bronson Ing back in the [place] office within the next one to 2 weeks. Discussed with patient and husband and they are in agreement.  Satira Sark, M.D., F.A.C.C.

## 2015-07-19 NOTE — Care Management Obs Status (Signed)
[facility] NOTIFICATION   Patient Details  Name: Penny Huynh MRN: [id number] Date of Birth: 1933/08/19   Medicare Observation Status Notification Given:  Yes    Sherald Barge, RN 07/19/2015, 1:47 PM

## 2015-07-19 NOTE — Evaluation (Signed)
Physical Therapy Evaluation Patient Details Name: Penny Huynh MRN: [id number] DOB: [birth year] Today's Date: 07/19/2015   History of Present Illness  Penny Huynh is a 82yo black female who comes to [facility] direct from PCP after some CP. She awoke Monday morning with 'room spinning' sensation that lasted for about 30 minutes and then gradually began to decrease, but never fully resolved, hence she went to see her PCP. The PCP noted the vertigo and CP (which later resolved with eructation). At eval, CP has fully resolved, pt reports she thought it was gas related all along, however diziness persists to a low degree.   Clinical Impression  Pt is received in bed, husband in room. Pt reports a head cold in the first week of May with persistent cough, but denies any involvement of ears. Hearing is symmetrical and without acute impairment. Smooth pursuits are WNL, saccades are intact. End-range occular gaze to the right is suspect for minimal left beating nystagmus, but will require additional testing equipment to thoroughly assess; end-range left occular gaze is WNL. VOR head thrust is unremarkable. Head turns during ambulation demonstrate repeated LOB with head turns over the right shoulder only. Vertical head turns are mildly provocative to balance, downward > upward.   Recommending patient follow-up with ENT and/or neurology after DC if symptoms persist and are concerning/limting. No additional PT services needed at this time. PT signing off.      Follow Up Recommendations No PT follow up    Equipment Recommendations  None recommended by PT    Recommendations for Other Services Other (comment) (ENT and/or neurology FU after DC. )     Precautions / Restrictions Precautions Precautions: None      Mobility  Bed Mobility Overal bed mobility: Independent                Transfers Overall transfer level: Independent Equipment used: None                 Ambulation/Gait Ambulation/Gait assistance: Programmer, systems Rankin (Stroke Patients Only)       Balance Overall balance assessment: Modified Independent (LOB with head turns to Right and down. )                                           Pertinent Vitals/Pain Pain Assessment: No/denies pain    Home Living Family/patient expects to be discharged to:: Private residence Living Arrangements: Spouse/significant other Available Help at Discharge: Family           Home Equipment: None      Prior Function Level of Independence: Independent               Hand Dominance        Extremity/Trunk Assessment                         Communication   Communication: No difficulties  Cognition Arousal/Alertness: Awake/alert Behavior During Therapy: WFL for tasks assessed/performed Overall Cognitive Status: Within Functional Limits for tasks assessed                      General Comments      Exercises  Assessment/Plan    PT Assessment Patent does not need any further PT services  PT Diagnosis Other (comment) (unsteadiness of feet. )   PT Problem List    PT Treatment Interventions     PT Goals (Current goals can be found in the Care Plan section) Acute Rehab PT Goals Patient Stated Goal: bring an end to this dizziness PT Goal Formulation: All assessment and education complete, DC therapy    Frequency     Barriers to discharge        Co-evaluation               End of Session Equipment Utilized During Treatment: Gait belt Activity Tolerance: Patient tolerated treatment well Patient left: in bed;with call bell/phone within reach;Other (comment);with family/visitor present (Cardioloy in room. ) Nurse Communication: Other (comment)    Functional Limitation: Mobility: Walking and moving around Mobility: Walking and Moving Around  Current Status [phone]): At least 1 percent but less than 20 percent impaired, limited or restricted Mobility: Walking and Moving Around Goal Status [id number]): At least 1 percent but less than 20 percent impaired, limited or restricted Mobility: Walking and Moving Around Discharge Status [phone]): At least 1 percent but less than 20 percent impaired, limited or restricted    Time: 1510-1524 PT Time Calculation (min) (ACUTE ONLY): 14 min   Charges:   PT Evaluation $PT Eval Low Complexity: 1 Procedure     PT G Codes:   PT G-Codes **NOT FOR INPATIENT CLASS** Functional Limitation: Mobility: Walking and moving around Mobility: Walking and Moving Around Current Status [id number]): At least 1 percent but less than 20 percent impaired, limited or restricted Mobility: Walking and Moving Around Goal Status [phone]): At least 1 percent but less than 20 percent impaired, limited or restricted Mobility: Walking and Moving Around Discharge Status [phone]): At least 1 percent but less than 20 percent impaired, limited or restricted    3:42 PM, 07/19/2015 Etta Grandchild, PT, DPT PRN Physical Therapist - [place] License # [id number] [phone] (mobile)

## 2015-07-19 NOTE — Progress Notes (Signed)
**Note De-Identified Davon Folta Obfuscation** STAT EKG complete; abnormal results reported to RN and placed in patients chart

## 2015-07-19 NOTE — Discharge Summary (Signed)
Penny Huynh, is a 82 y.o. female  DOB 11-Mar-1933  MRN [id number].  Admission date:  07/18/2015  Admitting Physician  Truett Mainland, DO  Discharge Date:  07/19/2015   Primary MD  Manon Hilding, MD  Recommendations for primary care physician for things to follow:  - To follow with cardiology as an outpatient, stress test has been arranged as an outpatient   Admission Diagnosis  Chest pain, unspecified chest pain type [R07.9]   Discharge Diagnosis  Chest pain, unspecified chest pain type [R07.9]    Principal Problem:   Chest pain Active Problems:   Essential hypertension, benign   Hypokalemia   Vertigo      Past Medical History  Diagnosis Date  . Coronary atherosclerosis     Minor nonobstructive CAD at cardiac catheterization 2009  . Dyslipidemia   . Type 2 diabetes mellitus ([place])   . Hypertension   . Lower extremity edema     Chronic  . Femoral bruit 7/08    With normal ABIs    History reviewed. No pertinent past surgical history.     History of present illness and  Hospital Course:     Kindly see H&P for history of present illness and admission details, please review complete Labs, Consult reports and Test reports for all details in brief  HPI  from the history and physical done on the day of admission 6/15/017 HPI: Penny Huynh is a 83 y.o. female with a history of type 2 diabetes, hypertension, systolic heart failure with an EF of 45%, hyperlipidemia. She presents to the emergency department with onset of chest pain last night which improved after belching. She continued to have residual chest tightness which is present this morning and in the emergency department. No other palliating or provoking factors. She did have some nausea when she was having the chest pain, but denies diaphoresis, radiation of the pain, shortness of breath.  She did have an onset of vertigo that  occurred on Monday. She no longer is vertiginous, but continues to have some feelings of disequilibrium   Hospital Course   82 year old female with history of hypertension, diabetes mellitus, hyperlipidemia, presents with complaints of chest pain, admitted for further workup.   Chest pain - Patient was admitted for observation, no significant events on telemetry, negative cardiac enzymes, seen by cardiology, continue with aspirin, statin, chest pain appears not typical, currently resolved, plan for stress test as an outpatient, event to see cardiology after that.  Hypertension - Blood pressure acceptable, continue with home medication   diabetes mellitus  - Resume  hypokalemia  - Repleted, continue potassium supplement as she is on chlorthalidone    CAD - Nonobstructive CAD in 2012, January the aspirin and statin  Hyperlipidemia - Continue with statin  Vertigo - PT consulted, will start on when necessary meclizine  Discharge Condition:  stable   Follow UP  Follow-up Information    Follow up with Kate Sable, MD On 07/31/2015.   Specialty:  Cardiology   Why:  1:40 PM   Contact information:   [place] [address] [phone]       Follow up with [facility] On 07/26/2015.   Why:  Stress Test Register at 7:15 am         Discharge Instructions  and  Discharge Medications     Discharge Instructions    Discharge instructions    Complete by:  As directed   Follow with Primary MD Manon Hilding, MD in 7 days   Get CBC, CMP,  checked  by Primary MD next visit.    Activity: As tolerated with Full fall precautions use walker/cane & assistance as needed   Disposition Home   Diet: Heart Healthy , carbohydrate modified, with feeding assistance and aspiration precautions.  For Heart failure patients - Check your Weight same time everyday, if you gain over 2 pounds, or you develop in leg swelling, experience more shortness of breath  or chest pain, call your Primary MD immediately. Follow Cardiac Low Salt Diet and 1.5 lit/day fluid restriction.   On your next visit with your primary care physician please Get Medicines reviewed and adjusted.   Please request your Prim.MD to go over all Hospital Tests and Procedure/Radiological results at the follow up, please get all Hospital records sent to your Prim MD by signing hospital release before you go home.   If you experience worsening of your admission symptoms, develop shortness of breath, life threatening emergency, suicidal or homicidal thoughts you must seek medical attention immediately by calling 911 or calling your MD immediately  if symptoms less severe.  You Must read complete instructions/literature along with all the possible adverse reactions/side effects for all the Medicines you take and that have been prescribed to you. Take any new Medicines after you have completely understood and accpet all the possible adverse reactions/side effects.   Do not drive, operating heavy machinery, perform activities at heights, swimming or participation in water activities or provide baby sitting services if your were admitted for syncope or siezures until you have seen by Primary MD or a Neurologist and advised to do so again.  Do not drive when taking Pain medications.    Do not take more than prescribed Pain, Sleep and Anxiety Medications  Special Instructions: If you have smoked or chewed Tobacco  in the last 2 yrs please stop smoking, stop any regular Alcohol  and or any Recreational drug use.  Wear Seat belts while driving.   Please note  You were cared for by a hospitalist during your hospital stay. If you have any questions about your discharge medications or the care you received while you were in the hospital after you are discharged, you can call the unit and asked to speak with the hospitalist on call if the hospitalist that took care of you is not available. Once  you are discharged, your primary care physician will handle any further medical issues. Please note that NO REFILLS for any discharge medications will be authorized once you are discharged, as it is imperative that you return to your primary care physician (or establish a relationship with a primary care physician if you do not have one) for your aftercare needs so that they can reassess your need for medications and monitor your lab values.     Increase activity slowly    Complete by:  As directed             Medication List    TAKE these  medications        aspirin 81 MG EC tablet  Take 81 mg by mouth daily.     chlorthalidone 25 MG tablet  Commonly known as:  HYGROTON  Take 25 mg by mouth daily.     CoQ10 100 MG Caps  Take 100 mg by mouth daily.     meclizine 12.5 MG tablet  Commonly known as:  ANTIVERT  Take 1 tablet (12.5 mg total) by mouth 3 (three) times daily as needed for dizziness.     metFORMIN 500 MG tablet  Commonly known as:  GLUCOPHAGE  Take 1,000 mg by mouth daily with breakfast.     potassium chloride 10 MEQ tablet  Commonly known as:  K-DUR,KLOR-CON  Take 1 tablet (10 mEq total) by mouth daily.     simvastatin 20 MG tablet  Commonly known as:  ZOCOR  Take 40 mg by mouth every evening.     valsartan 160 MG tablet  Commonly known as:  DIOVAN  Take 160 mg by mouth daily.     vitamin B-12 500 MCG tablet  Commonly known as:  CYANOCOBALAMIN  Take 500 mcg by mouth daily.          Diet and Activity recommendation: See Discharge Instructions above   Consults obtained -  Cardiolgoy   Major procedures and Radiology Reports - PLEASE review detailed and final reports for all details, in brief -      Dg Chest Portable 1 View  07/18/2015  CLINICAL DATA:  Dizziness and chest pain for 4 days EXAM: PORTABLE CHEST 1 VIEW COMPARISON:  None. FINDINGS: The heart size and mediastinal contours are within normal limits. Both lungs are clear. The visualized  skeletal structures are unremarkable. IMPRESSION: No active disease. Electronically Signed   By: Inez Catalina M.D.   On: 07/18/2015 13:32    Micro Results    No results found for this or any previous visit (from the past 240 hour(s)).     Today   Subjective:   Penny Huynh today has no headache,no chest abdominal pain,no new weakness tingling or numbness, feels much better wants to go home today.   Objective:   Blood pressure 142/72, pulse 62, temperature 98.4 F (36.9 C), temperature source Oral, resp. rate 20, height 5\' 4"  (1.626 m), weight 73.483 kg (162 lb), SpO2 98 %.   Intake/Output Summary (Last 24 hours) at 07/19/15 1549 Last data filed at 07/19/15 0900  Gross per 24 hour  Intake    120 ml  Output      0 ml  Net    120 ml    Exam Awake Alert, Oriented x 3, No new F.N deficits, Normal affect [place].AT,PERRAL Supple Neck,No JVD, No cervical lymphadenopathy appriciated.  Symmetrical Chest wall movement, Good air movement bilaterally, CTAB RRR,No Gallops,Rubs or new Murmurs, No Parasternal Heave +ve B.Sounds, Abd Soft, Non tender, No organomegaly appriciated, No rebound -guarding or rigidity. No Cyanosis, Clubbing or edema, No new Rash or bruise  Data Review   CBC w Diff: Lab Results  Component Value Date   WBC 4.2 07/18/2015   HGB 12.6 07/18/2015   HCT 38.0 07/18/2015   PLT 216 07/18/2015   LYMPHOPCT 44 07/18/2015   MONOPCT 8 07/18/2015   EOSPCT 3 07/18/2015   BASOPCT 1 07/18/2015    CMP: Lab Results  Component Value Date   NA 131* 07/18/2015   K 3.1* 07/18/2015   CL 97* 07/18/2015   CO2 27 07/18/2015   BUN 13  07/18/2015   CREATININE 0.78 07/18/2015  .   Total Time in preparing paper work, data evaluation and todays exam - 35 minutes  Jase Himmelberger M.D on 07/19/2015 at 3:49 PM  [facility]   Office  [phone]

## 2015-07-19 NOTE — Discharge Instructions (Signed)
Follow with Primary MD Manon Hilding, MD in 7 days   Get CBC, CMP,  checked  by Primary MD next visit.    Activity: As tolerated with Full fall precautions use walker/cane & assistance as needed   Disposition Home   Diet: Heart Healthy , carbohydrate modified, with feeding assistance and aspiration precautions.  For Heart failure patients - Check your Weight same time everyday, if you gain over 2 pounds, or you develop in leg swelling, experience more shortness of breath or chest pain, call your Primary MD immediately. Follow Cardiac Low Salt Diet and 1.5 lit/day fluid restriction.   On your next visit with your primary care physician please Get Medicines reviewed and adjusted.   Please request your Prim.MD to go over all Hospital Tests and Procedure/Radiological results at the follow up, please get all Hospital records sent to your Prim MD by signing hospital release before you go home.   If you experience worsening of your admission symptoms, develop shortness of breath, life threatening emergency, suicidal or homicidal thoughts you must seek medical attention immediately by calling 911 or calling your MD immediately  if symptoms less severe.  You Must read complete instructions/literature along with all the possible adverse reactions/side effects for all the Medicines you take and that have been prescribed to you. Take any new Medicines after you have completely understood and accpet all the possible adverse reactions/side effects.   Do not drive, operating heavy machinery, perform activities at heights, swimming or participation in water activities or provide baby sitting services if your were admitted for syncope or siezures until you have seen by Primary MD or a Neurologist and advised to do so again.  Do not drive when taking Pain medications.    Do not take more than prescribed Pain, Sleep and Anxiety Medications  Special Instructions: If you have smoked or chewed Tobacco  in  the last 2 yrs please stop smoking, stop any regular Alcohol  and or any Recreational drug use.  Wear Seat belts while driving.   Please note  You were cared for by a hospitalist during your hospital stay. If you have any questions about your discharge medications or the care you received while you were in the hospital after you are discharged, you can call the unit and asked to speak with the hospitalist on call if the hospitalist that took care of you is not available. Once you are discharged, your primary care physician will handle any further medical issues. Please note that NO REFILLS for any discharge medications will be authorized once you are discharged, as it is imperative that you return to your primary care physician (or establish a relationship with a primary care physician if you do not have one) for your aftercare needs so that they can reassess your need for medications and monitor your lab values.

## 2015-07-19 NOTE — Care Management Note (Signed)
Case Management Note  Patient Details  Name: TRALANA GETTELFINGER MRN: [id number] Date of Birth: November 21, 1933  Subjective/Objective:                  Pt admitted with r/o CP. Pt is from home, family at bedside. Pt is ind with ADL's. No HH services or DME prior to admission. Pt has PCP, drives herself to appointments and has no difficulty affording her medications. Pt plans to return home with self care.   Action/Plan: No CM needs anticipated.   Expected Discharge Date:    07/19/2015              Expected Discharge Plan:  Home/Self Care  In-House Referral:  NA  Discharge planning Services  CM Consult  Post Acute Care Choice:  NA Choice offered to:  NA  DME Arranged:    DME Agency:     HH Arranged:    HH Agency:     Status of Service:  Completed, signed off  Medicare Important Message Given:    Date Medicare IM Given:    Medicare IM give by:    Date Additional Medicare IM Given:    Additional Medicare Important Message give by:     If discussed at [place] of Stay Meetings, dates discussed:    Additional Comments:  Sherald Barge, RN 07/19/2015, 1:50 PM

## 2015-07-19 NOTE — Progress Notes (Deleted)
PROGRESS NOTE                                                                                                                                                                                                             Patient Demographics:    Penny Huynh, is a 82 y.o. female, DOB - 12/27/1933, [id number]  Admit date - 07/18/2015   Admitting Physician Truett Mainland, DO  Outpatient Primary MD for the patient is Manon Hilding, MD  LOS -    Chief Complaint  Patient presents with  . Chest Pain  . Dizziness       Brief Narrative   82 year old female with history of hypertension, diabetes mellitus, hyperlipidemia, presents with complaints of chest pain, admitted for further workup.   Subjective:    Orpha Bur today has, No headache, No chest pain, No abdominal pain -   Assessment  & Plan :    Principal Problem:   Chest pain Active Problems:   Essential hypertension, benign   Hypokalemia   Vertigo   Chest pain - Patient was admitted for observation, no significant events on telemetry, negative cardiac enzymes, seen by cardiology, continue with aspirin, statin  Hypertension - Blood pressure acceptable, continue with home medication    diabetes mellitus  - Continue to hold metformin, start an insulin sliding scale    hypokalemia  - Repleted, continuous supplement as she is on chlorthalidone    CAD - Nonobstructive CAD in 2012, January the aspirin and statin  Hyperlipidemia - Continue with statin  Vertigo - PT consult, will start meclizine   Code Status : Full  Family Communication  : none at bedsdie  Disposition Plan  : Home   Consults  :  Cardiology  Procedures  : none  DVT Prophylaxis  :  Lovenox   Lab Results  Component Value Date   PLT 216 07/18/2015    Antibiotics  :    Anti-infectives    None        Objective:   Filed Vitals:   07/19/15 0150 07/19/15 0633 07/19/15 1020 07/19/15 1259    BP: 144/67 153/68 142/64 142/72  Pulse: 57 57 64 62  Temp: 98.3 F (36.8 C) 97.9 F (36.6 C) 98.2 F (36.8 C) 98.4 F (36.9 C)  TempSrc: Oral Oral Oral Oral  Resp: 20 20 18  20  Height:      Weight:      SpO2: 100% 100% 99% 98%    Wt Readings from Last 3 Encounters:  07/18/15 73.483 kg (162 lb)  10/29/14 72.576 kg (160 lb)  10/31/13 76.658 kg (169 lb)     Intake/Output Summary (Last 24 hours) at 07/19/15 1456 Last data filed at 07/19/15 0900  Gross per 24 hour  Intake    120 ml  Output      0 ml  Net    120 ml     Physical Exam  Awake Alert, Oriented X 3, No new F.N deficits, Normal affect .AT,PERRAL Supple Neck,No JVD, No cervical lymphadenopathy appriciated.  Symmetrical Chest wall movement, Good air movement bilaterally, CTAB RRR,No Gallops,Rubs or new Murmurs, No Parasternal Heave +ve B.Sounds, Abd Soft, No tenderness, No organomegaly appriciated, No rebound - guarding or rigidity. No Cyanosis, Clubbing or edema, No new Rash or bruise      Data Review:    CBC  Recent Labs Lab 07/18/15 1358  WBC 4.2  HGB 12.6  HCT 38.0  PLT 216  MCV 92.7  MCH 30.7  MCHC 33.2  RDW 13.4  LYMPHSABS 1.8  MONOABS 0.3  EOSABS 0.1  BASOSABS 0.0    Chemistries   Recent Labs Lab 07/18/15 1358  NA 131*  K 3.1*  CL 97*  CO2 27  GLUCOSE 102*  BUN 13  CREATININE 0.78  CALCIUM 9.1   ------------------------------------------------------------------------------------------------------------------ No results for input(s): CHOL, HDL, LDLCALC, TRIG, CHOLHDL, LDLDIRECT in the last 72 hours.  No results found for: HGBA1C ------------------------------------------------------------------------------------------------------------------ No results for input(s): TSH, T4TOTAL, T3FREE, THYROIDAB in the last 72 hours.  Invalid input(s): FREET3 ------------------------------------------------------------------------------------------------------------------ No results  for input(s): VITAMINB12, FOLATE, FERRITIN, TIBC, IRON, RETICCTPCT in the last 72 hours.  Coagulation profile No results for input(s): INR, PROTIME in the last 168 hours.  No results for input(s): DDIMER in the last 72 hours.  Cardiac Enzymes  Recent Labs Lab 07/19/15 0022 07/19/15 0659 07/19/15 1316  TROPONINI <0.03 <0.03 <0.03   ------------------------------------------------------------------------------------------------------------------ No results found for: BNP  Inpatient Medications  Scheduled Meds: . aspirin EC  81 mg Oral Daily  . chlorthalidone  25 mg Oral Daily  . enoxaparin (LOVENOX) injection  40 mg Subcutaneous Q24H  . insulin aspart  0-5 Units Subcutaneous QHS  . insulin aspart  0-9 Units Subcutaneous TID WC  . irbesartan  150 mg Oral Daily  . nitroGLYCERIN      . potassium chloride  10 mEq Oral Daily  . simvastatin  40 mg Oral QPM   Continuous Infusions:  PRN Meds:.acetaminophen, gi cocktail, morphine injection, ondansetron (ZOFRAN) IV  Micro Results No results found for this or any previous visit (from the past 240 hour(s)).  Radiology Reports Dg Chest Portable 1 View  07/18/2015  CLINICAL DATA:  Dizziness and chest pain for 4 days EXAM: PORTABLE CHEST 1 VIEW COMPARISON:  None. FINDINGS: The heart size and mediastinal contours are within normal limits. Both lungs are clear. The visualized skeletal structures are unremarkable. IMPRESSION: No active disease. Electronically Signed   By: Inez Catalina M.D.   On: 07/18/2015 13:32    Time Spent in minutes  25 minutes   Karelly Dewalt M.D on 07/19/2015 at 2:56 PM  Between 7am to 7pm - Pager - [phone]  After 7pm go to [email or web address] - password [facility]  [facility] -  Office  [phone]

## 2015-07-20 NOTE — Progress Notes (Signed)
Patient discharged with instructions, prescription, and care notes.  Verbalized understanding via teach back.  IV was removed and the site was WNL. Patient voiced no further complaints or concerns at the time of discharge.  Appointments scheduled per instructions.  Patient left the floor via w/c with staff and family in stable condition. 

## 2015-07-20 NOTE — Progress Notes (Signed)
   07/19/15 1220  Vitals  BP (!) 142/65 mmHg  Pulse Rate 66  Pulse Rate Source Dinamap  Pain Assessment  Pain Assessment 0-10  Pain Score 10  Pain Type Acute pain  Pain Location Chest  Pain Orientation Mid;Upper  Pain Descriptors / Indicators Pressure  Pain Frequency Constant  Pain Intervention(s) Medication (See eMAR) (NTG)   I was called to the patients' room due to her complaint of chest pain.  She did not have any further complaints at the time outside the chest pain. EKG was ordered and NTG was administered.   Olga Millers NP was still on the floor and was notified of the situation.  I notified her of the chest pain and interventions and after the interventions were done the patients chest pain was gone per the patient.  Late entry.

## 2015-07-26 ENCOUNTER — Encounter (HOSPITAL_COMMUNITY): Payer: Medicare Other

## 2015-07-26 ENCOUNTER — Ambulatory Visit (HOSPITAL_COMMUNITY): Payer: Medicare Other

## 2015-07-26 ENCOUNTER — Inpatient Hospital Stay (HOSPITAL_COMMUNITY): Admit: 2015-07-26 | Payer: Medicare Other

## 2015-07-31 ENCOUNTER — Encounter: Payer: Medicare Other | Admitting: Cardiovascular Disease

## 2015-08-02 ENCOUNTER — Encounter (HOSPITAL_COMMUNITY): Admission: RE | Admit: 2015-08-02 | Payer: Medicare Other | Source: Ambulatory Visit

## 2015-08-02 ENCOUNTER — Ambulatory Visit (HOSPITAL_COMMUNITY): Admission: RE | Admit: 2015-08-02 | Payer: Medicare Other | Source: Ambulatory Visit

## 2015-08-02 ENCOUNTER — Encounter (HOSPITAL_COMMUNITY)
Admission: RE | Admit: 2015-08-02 | Discharge: 2015-08-02 | Disposition: A | Discharge status: Home / Self Care | Payer: Medicare Other | Source: Ambulatory Visit | Attending: Adult Health | Admitting: Adult Health

## 2015-08-02 DIAGNOSIS — I2510 Atherosclerotic heart disease of native coronary artery without angina pectoris: Secondary | ICD-10-CM

## 2015-08-07 ENCOUNTER — Encounter: Payer: Medicare Other | Admitting: Cardiovascular Disease

## 2015-08-09 ENCOUNTER — Encounter (HOSPITAL_COMMUNITY)
Admission: RE | Admit: 2015-08-09 | Discharge: 2015-08-09 | Disposition: A | Discharge status: Home / Self Care | Payer: Medicare Other | Source: Ambulatory Visit | Attending: Adult Health | Admitting: Adult Health

## 2015-08-09 ENCOUNTER — Inpatient Hospital Stay (HOSPITAL_COMMUNITY): Admission: RE | Admit: 2015-08-09 | Payer: Medicare Other | Source: Ambulatory Visit

## 2015-08-09 ENCOUNTER — Encounter (HOSPITAL_COMMUNITY): Payer: Self-pay

## 2015-08-09 DIAGNOSIS — I2510 Atherosclerotic heart disease of native coronary artery without angina pectoris: Secondary | ICD-10-CM | POA: Diagnosis not present

## 2015-08-09 LAB — NM MYOCAR MULTI W/SPECT W/WALL MOTION / EF
CHL CUP NUCLEAR SDS: 0
CHL CUP NUCLEAR SRS: 0
CHL CUP NUCLEAR SSS: 0
CHL CUP RESTING HR STRESS: 60 {beats}/min
CSEPED: 5 min
CSEPEDS: 10 s
Estimated workload: 7 METS
LV dias vol: 43 mL (ref 46–106)
LV sys vol: 14 mL
MPHR: 141 {beats}/min
Peak HR: 141 {beats}/min
Percent HR: 100 %
RATE: 0.77
RPE: 13
TID: 0.87

## 2015-08-09 MED ORDER — SODIUM CHLORIDE 0.9% FLUSH
INTRAVENOUS | Status: AC
  Filled 2015-08-09: qty 10

## 2015-08-09 MED ORDER — TECHNETIUM TC 99M TETROFOSMIN IV KIT
30.0000 | PACK | Freq: Once | INTRAVENOUS | Status: AC | PRN
  Administered 2015-08-09: 30 via INTRAVENOUS

## 2015-08-09 MED ORDER — REGADENOSON 0.4 MG/5ML IV SOLN
INTRAVENOUS | Status: AC
  Filled 2015-08-09: qty 5

## 2015-08-09 MED ORDER — TECHNETIUM TC 99M TETROFOSMIN IV KIT
10.0000 | PACK | Freq: Once | INTRAVENOUS | Status: AC | PRN
  Administered 2015-08-09: 10 via INTRAVENOUS

## 2015-08-20 ENCOUNTER — Encounter: Payer: Self-pay | Admitting: Cardiovascular Disease

## 2015-08-20 ENCOUNTER — Ambulatory Visit (INDEPENDENT_AMBULATORY_CARE_PROVIDER_SITE_OTHER): Payer: Medicare Other | Admitting: Cardiovascular Disease

## 2015-08-20 VITALS — BP 137/79 | HR 64 | Ht 64.0 in | Wt 167.0 lb

## 2015-08-20 DIAGNOSIS — I2510 Atherosclerotic heart disease of native coronary artery without angina pectoris: Secondary | ICD-10-CM

## 2015-08-20 DIAGNOSIS — Z9289 Personal history of other medical treatment: Secondary | ICD-10-CM

## 2015-08-20 DIAGNOSIS — I452 Bifascicular block: Secondary | ICD-10-CM | POA: Diagnosis not present

## 2015-08-20 DIAGNOSIS — I5189 Other ill-defined heart diseases: Secondary | ICD-10-CM

## 2015-08-20 DIAGNOSIS — I10 Essential (primary) hypertension: Secondary | ICD-10-CM

## 2015-08-20 DIAGNOSIS — I519 Heart disease, unspecified: Secondary | ICD-10-CM | POA: Diagnosis not present

## 2015-08-20 DIAGNOSIS — Z87898 Personal history of other specified conditions: Secondary | ICD-10-CM

## 2015-08-20 NOTE — Patient Instructions (Signed)

## 2015-08-20 NOTE — Progress Notes (Signed)
Patient ID: Penny Huynh, female   DOB: May 01, 1933, 82 y.o.   MRN: [id number]      SUBJECTIVE: The patient returns for post hospitalization follow-up. She was hospitalized in June for atypical chest discomfort and ruled out for an acute coronary syndrome. She subsequently underwent a normal nuclear stress test on 08/09/15 and demonstrated a hypertensive response to exercise. She has a history of nonobstructive coronary artery disease. ECG in June showed sinus rhythm with a right bundle branch block.  She has had no further episodes of chest pain. She has occasional exertional dyspnea. She walks 2-3 miles every morning and then works out for 45 minutes at a gym.  Review of Systems: As per "subjective", otherwise negative.  No Known Allergies  Current Outpatient Prescriptions  Medication Sig Dispense Refill  . aspirin 81 MG EC tablet Take 81 mg by mouth daily.      . chlorthalidone (HYGROTON) 25 MG tablet Take 25 mg by mouth daily.     . Coenzyme Q10 (COQ10) 100 MG CAPS Take 100 mg by mouth daily.      . meclizine (ANTIVERT) 12.5 MG tablet Take 1 tablet (12.5 mg total) by mouth 3 (three) times daily as needed for dizziness. 30 tablet 0  . metFORMIN (GLUCOPHAGE) 500 MG tablet Take 1,000 mg by mouth daily with breakfast.     . potassium chloride (K-DUR,KLOR-CON) 10 MEQ tablet Take 1 tablet (10 mEq total) by mouth daily. 30 tablet 0  . simvastatin (ZOCOR) 20 MG tablet Take 40 mg by mouth every evening.     . valsartan (DIOVAN) 160 MG tablet Take 160 mg by mouth daily.      . vitamin B-12 (CYANOCOBALAMIN) 500 MCG tablet Take 500 mcg by mouth daily.     No current facility-administered medications for this visit.    Past Medical History  Diagnosis Date  . Coronary atherosclerosis     Minor nonobstructive CAD at cardiac catheterization 2009  . Dyslipidemia   . Type 2 diabetes mellitus ([place])   . Hypertension   . Lower extremity edema     Chronic  . Femoral bruit 7/08    With normal  ABIs    No past surgical history on file.  Social History   Social History  . Marital Status: Married    Spouse Name: N/A  . Number of Children: 2  . Years of Education: N/A   Occupational History  . Not on file.   Social History Main Topics  . Smoking status: Former Smoker -- 2.00 packs/day for 41 years    Types: Cigarettes    Start date: 11/09/1953    Quit date: 02/02/1994  . Smokeless tobacco: Never Used  . Alcohol Use: 0.0 oz/week    0 Standard drinks or equivalent per week     Comment: Occasionally-Homemade wine- AJ  . Drug Use: No  . Sexual Activity: Not on file   Other Topics Concern  . Not on file   Social History Narrative   Lives in [place] with her husband.   Has 2 grown children.     Filed Vitals:   08/20/15 1448  BP: 137/79  Pulse: 64  Height: 5\' 4"  (1.626 m)  Weight: 167 lb (75.751 kg)    PHYSICAL EXAM General: NAD HEENT: Normal. Neck: No JVD, no thyromegaly. Lungs: Clear to auscultation bilaterally with normal respiratory effort. CV: Nondisplaced PMI.  Regular rate and rhythm, normal S1/S2, no S3/S4, no murmur. No pretibial or periankle edema.  No carotid  bruit.   Abdomen: Soft, nontender, no distention.  Neurologic: Alert and oriented.  Psych: Normal affect. Skin: Normal. Musculoskeletal: No gross deformities.    ECG: Most recent ECG reviewed.      ASSESSMENT AND PLAN: 1. Nonobstructive CAD: Symptomatically stable. Normal nuclear stress test on 08/09/15. Continue ASA and simvastatin.  2. Essential HTN: Controlled on chlorthalidone and valsartan. No changes.  3. Diastolic dysfunction: No evidence of volume overload. BP is well controlled.  Dispo: fu 1 year.  Kate Sable, M.D., F.A.C.C.

## 2015-09-18 DIAGNOSIS — E782 Mixed hyperlipidemia: Secondary | ICD-10-CM | POA: Diagnosis not present

## 2015-09-18 DIAGNOSIS — I10 Essential (primary) hypertension: Secondary | ICD-10-CM | POA: Diagnosis not present

## 2015-09-18 DIAGNOSIS — E876 Hypokalemia: Secondary | ICD-10-CM | POA: Diagnosis not present

## 2015-09-18 DIAGNOSIS — E1140 Type 2 diabetes mellitus with diabetic neuropathy, unspecified: Secondary | ICD-10-CM | POA: Diagnosis not present

## 2015-09-18 DIAGNOSIS — E7800 Pure hypercholesterolemia, unspecified: Secondary | ICD-10-CM | POA: Diagnosis not present

## 2015-09-23 DIAGNOSIS — E1140 Type 2 diabetes mellitus with diabetic neuropathy, unspecified: Secondary | ICD-10-CM | POA: Diagnosis not present

## 2015-09-23 DIAGNOSIS — E782 Mixed hyperlipidemia: Secondary | ICD-10-CM | POA: Diagnosis not present

## 2015-09-23 DIAGNOSIS — E1165 Type 2 diabetes mellitus with hyperglycemia: Secondary | ICD-10-CM | POA: Diagnosis not present

## 2015-09-23 DIAGNOSIS — I10 Essential (primary) hypertension: Secondary | ICD-10-CM | POA: Diagnosis not present

## 2015-09-23 DIAGNOSIS — Z6829 Body mass index (BMI) 29.0-29.9, adult: Secondary | ICD-10-CM | POA: Diagnosis not present

## 2015-09-23 DIAGNOSIS — R252 Cramp and spasm: Secondary | ICD-10-CM | POA: Diagnosis not present

## 2015-11-08 DIAGNOSIS — Z23 Encounter for immunization: Secondary | ICD-10-CM | POA: Diagnosis not present

## 2015-11-20 DIAGNOSIS — Z23 Encounter for immunization: Secondary | ICD-10-CM | POA: Diagnosis not present

## 2015-11-20 DIAGNOSIS — R05 Cough: Secondary | ICD-10-CM | POA: Diagnosis not present

## 2015-11-20 DIAGNOSIS — Z6829 Body mass index (BMI) 29.0-29.9, adult: Secondary | ICD-10-CM | POA: Diagnosis not present

## 2015-12-19 DIAGNOSIS — R5383 Other fatigue: Secondary | ICD-10-CM | POA: Diagnosis not present

## 2015-12-19 DIAGNOSIS — Z6829 Body mass index (BMI) 29.0-29.9, adult: Secondary | ICD-10-CM | POA: Diagnosis not present

## 2015-12-19 DIAGNOSIS — R0602 Shortness of breath: Secondary | ICD-10-CM | POA: Diagnosis not present

## 2015-12-19 DIAGNOSIS — R42 Dizziness and giddiness: Secondary | ICD-10-CM | POA: Diagnosis not present

## 2016-01-09 DIAGNOSIS — E876 Hypokalemia: Secondary | ICD-10-CM | POA: Diagnosis not present

## 2016-01-09 DIAGNOSIS — E782 Mixed hyperlipidemia: Secondary | ICD-10-CM | POA: Diagnosis not present

## 2016-01-09 DIAGNOSIS — I10 Essential (primary) hypertension: Secondary | ICD-10-CM | POA: Diagnosis not present

## 2016-01-09 DIAGNOSIS — E7800 Pure hypercholesterolemia, unspecified: Secondary | ICD-10-CM | POA: Diagnosis not present

## 2016-01-09 DIAGNOSIS — E1140 Type 2 diabetes mellitus with diabetic neuropathy, unspecified: Secondary | ICD-10-CM | POA: Diagnosis not present

## 2016-01-09 DIAGNOSIS — E1165 Type 2 diabetes mellitus with hyperglycemia: Secondary | ICD-10-CM | POA: Diagnosis not present

## 2016-01-12 DIAGNOSIS — E782 Mixed hyperlipidemia: Secondary | ICD-10-CM | POA: Insufficient documentation

## 2016-01-12 DIAGNOSIS — E1140 Type 2 diabetes mellitus with diabetic neuropathy, unspecified: Secondary | ICD-10-CM | POA: Insufficient documentation

## 2016-01-13 DIAGNOSIS — R252 Cramp and spasm: Secondary | ICD-10-CM | POA: Diagnosis not present

## 2016-01-13 DIAGNOSIS — M79605 Pain in left leg: Secondary | ICD-10-CM | POA: Diagnosis not present

## 2016-01-13 DIAGNOSIS — I10 Essential (primary) hypertension: Secondary | ICD-10-CM | POA: Diagnosis not present

## 2016-01-13 DIAGNOSIS — E1140 Type 2 diabetes mellitus with diabetic neuropathy, unspecified: Secondary | ICD-10-CM | POA: Diagnosis not present

## 2016-01-13 DIAGNOSIS — Z1389 Encounter for screening for other disorder: Secondary | ICD-10-CM | POA: Diagnosis not present

## 2016-01-13 DIAGNOSIS — E782 Mixed hyperlipidemia: Secondary | ICD-10-CM | POA: Diagnosis not present

## 2016-01-13 DIAGNOSIS — M79604 Pain in right leg: Secondary | ICD-10-CM | POA: Diagnosis not present

## 2016-01-13 DIAGNOSIS — R05 Cough: Secondary | ICD-10-CM | POA: Diagnosis not present

## 2016-07-15 DIAGNOSIS — I10 Essential (primary) hypertension: Secondary | ICD-10-CM | POA: Diagnosis not present

## 2016-07-15 DIAGNOSIS — E876 Hypokalemia: Secondary | ICD-10-CM | POA: Diagnosis not present

## 2016-07-15 DIAGNOSIS — E7800 Pure hypercholesterolemia, unspecified: Secondary | ICD-10-CM | POA: Diagnosis not present

## 2016-07-15 DIAGNOSIS — E1140 Type 2 diabetes mellitus with diabetic neuropathy, unspecified: Secondary | ICD-10-CM | POA: Diagnosis not present

## 2016-07-15 DIAGNOSIS — E1165 Type 2 diabetes mellitus with hyperglycemia: Secondary | ICD-10-CM | POA: Diagnosis not present

## 2016-07-15 DIAGNOSIS — E782 Mixed hyperlipidemia: Secondary | ICD-10-CM | POA: Diagnosis not present

## 2016-07-16 DIAGNOSIS — I10 Essential (primary) hypertension: Secondary | ICD-10-CM | POA: Diagnosis not present

## 2016-07-16 DIAGNOSIS — R05 Cough: Secondary | ICD-10-CM | POA: Diagnosis not present

## 2016-07-16 DIAGNOSIS — E782 Mixed hyperlipidemia: Secondary | ICD-10-CM | POA: Diagnosis not present

## 2016-07-16 DIAGNOSIS — E1140 Type 2 diabetes mellitus with diabetic neuropathy, unspecified: Secondary | ICD-10-CM | POA: Diagnosis not present

## 2016-07-16 DIAGNOSIS — E1165 Type 2 diabetes mellitus with hyperglycemia: Secondary | ICD-10-CM | POA: Diagnosis not present

## 2016-07-16 DIAGNOSIS — Z6829 Body mass index (BMI) 29.0-29.9, adult: Secondary | ICD-10-CM | POA: Diagnosis not present

## 2016-07-16 DIAGNOSIS — R252 Cramp and spasm: Secondary | ICD-10-CM | POA: Diagnosis not present

## 2016-07-17 DIAGNOSIS — H401131 Primary open-angle glaucoma, bilateral, mild stage: Secondary | ICD-10-CM | POA: Diagnosis not present

## 2016-07-17 DIAGNOSIS — H25813 Combined forms of age-related cataract, bilateral: Secondary | ICD-10-CM | POA: Diagnosis not present

## 2016-07-17 DIAGNOSIS — E119 Type 2 diabetes mellitus without complications: Secondary | ICD-10-CM | POA: Diagnosis not present

## 2016-08-25 ENCOUNTER — Other Ambulatory Visit: Payer: Self-pay

## 2016-11-12 DIAGNOSIS — Z23 Encounter for immunization: Secondary | ICD-10-CM | POA: Diagnosis not present

## 2016-11-17 ENCOUNTER — Encounter: Payer: Self-pay | Admitting: *Deleted

## 2016-11-18 ENCOUNTER — Encounter: Payer: Self-pay | Admitting: Cardiovascular Disease

## 2016-11-18 ENCOUNTER — Ambulatory Visit (INDEPENDENT_AMBULATORY_CARE_PROVIDER_SITE_OTHER): Payer: Medicare Other | Admitting: Cardiovascular Disease

## 2016-11-18 VITALS — BP 126/64 | HR 69 | Ht 64.5 in | Wt 162.2 lb

## 2016-11-18 DIAGNOSIS — I10 Essential (primary) hypertension: Secondary | ICD-10-CM | POA: Diagnosis not present

## 2016-11-18 DIAGNOSIS — I519 Heart disease, unspecified: Secondary | ICD-10-CM

## 2016-11-18 DIAGNOSIS — I5189 Other ill-defined heart diseases: Secondary | ICD-10-CM

## 2016-11-18 DIAGNOSIS — I2510 Atherosclerotic heart disease of native coronary artery without angina pectoris: Secondary | ICD-10-CM | POA: Diagnosis not present

## 2016-11-18 NOTE — Patient Instructions (Signed)

## 2016-11-18 NOTE — Progress Notes (Signed)
SUBJECTIVE: The patient presents for annual follow-up. She has nonobstructive coronary artery disease, hypertension, and diastolic dysfunction. She underwent a normal nuclear stress test on 08/09/15 and demonstrated a hypertensive response to exercise.  The patient denies any symptoms of chest pain, palpitations, shortness of breath, lightheadedness, dizziness, leg swelling, orthopnea, PND, and syncope.  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 She said she has not been walking like she knows she should. She is also taking an [place] class and a math class.   Review of Systems: As per "subjective", otherwise negative.  No Known Allergies  Current Outpatient Prescriptions  Medication Sig Dispense Refill  . aspirin 81 MG EC tablet Take 81 mg by mouth daily.      . chlorthalidone (HYGROTON) 25 MG tablet Take 25 mg by mouth daily.     . Coenzyme Q10 (COQ10) 100 MG CAPS Take 100 mg by mouth daily.      . metFORMIN (GLUCOPHAGE) 500 MG tablet Take 1,000 mg by mouth daily with breakfast.     . simvastatin (ZOCOR) 20 MG tablet Take 40 mg by mouth every evening.     . valsartan (DIOVAN) 160 MG tablet Take 160 mg by mouth daily.      . vitamin B-12 (CYANOCOBALAMIN) 500 MCG tablet Take 500 mcg by mouth daily.     No current facility-administered medications for this visit.     Past Medical History:  Diagnosis Date  . Coronary atherosclerosis    Minor nonobstructive CAD at cardiac catheterization 2009  . Dyslipidemia   . Femoral bruit 7/08   With normal ABIs  . Hypertension   . Lower extremity edema    Chronic  . Type 2 diabetes mellitus ([facility])     No past surgical history on file.  Social History   Social History  . Marital status: Married    Spouse name: N/A  . Number of children: 2  . Years of education: N/A   Occupational History  . Not on file.   Social History Main Topics  . Smoking status: Former Smoker   Packs/day: 2.00    Years: 41.00    Types: Cigarettes    Start date: 11/09/1953    Quit date: 02/02/1994  . Smokeless tobacco: Never Used  . Alcohol use 0.0 oz/week     Comment: Occasionally-Homemade wine- AJ  . Drug use: No  . Sexual activity: Not on file   Other Topics Concern  . Not on file   Social History Narrative   Lives in [place] with her husband.   Has 2 grown children.     Vitals:   11/18/16 1315  BP: 126/64  Pulse: 69  SpO2: 98%  Weight: 162 lb 3.2 oz (73.6 kg)  Height: 5' 4.5" (1.638 m)    Wt Readings from Last 3 Encounters:  11/18/16 162 lb 3.2 oz (73.6 kg)  08/20/15 167 lb (75.8 kg)  07/18/15 162 lb (73.5 kg)     PHYSICAL EXAM General: NAD HEENT: Normal. Neck: No JVD, no thyromegaly. Lungs: Clear to auscultation bilaterally with normal respiratory effort. CV: Nondisplaced PMI.  Regular rate and rhythm, normal S1/S2, no S3/S4, no murmur. No pretibial or periankle edema.  No carotid bruit.   Abdomen: Soft, nontender, no distention.  Neurologic: Alert and oriented.  Psych: Normal affect. Skin: Normal. Musculoskeletal: No gross deformities.    ECG: Most recent ECG reviewed.   Labs: Lab Results  Component Value Date/Time  K 3.1 (L) 07/18/2015 01:58 PM   BUN 13 07/18/2015 01:58 PM   CREATININE 0.78 07/18/2015 01:58 PM   HGB 12.6 07/18/2015 01:58 PM     Lipids: No results found for: LDLCALC, LDLDIRECT, CHOL, TRIG, HDL     ASSESSMENT AND PLAN:  1. Nonobstructive CAD: Symptomatically stable. Normal nuclear stress test on 08/09/15. Continue ASA and simvastatin. I encouraged her to walk more regularly.  2. Essential HTN: Controlled on chlorthalidone and valsartan. No changes.  3. Diastolic dysfunction: No evidence of volume overload. BP is well controlled.     Disposition: Follow up 1 year.   Kate Sable, M.D., F.A.C.C.

## 2017-01-08 ENCOUNTER — Other Ambulatory Visit: Payer: Self-pay | Admitting: Family Medicine

## 2017-01-08 DIAGNOSIS — N6321 Unspecified lump in the left breast, upper outer quadrant: Secondary | ICD-10-CM | POA: Diagnosis not present

## 2017-01-08 DIAGNOSIS — Z6828 Body mass index (BMI) 28.0-28.9, adult: Secondary | ICD-10-CM | POA: Diagnosis not present

## 2017-01-08 DIAGNOSIS — N6320 Unspecified lump in the left breast, unspecified quadrant: Secondary | ICD-10-CM

## 2017-01-08 DIAGNOSIS — N6322 Unspecified lump in the left breast, upper inner quadrant: Secondary | ICD-10-CM | POA: Diagnosis not present

## 2017-01-13 ENCOUNTER — Ambulatory Visit
Admission: RE | Admit: 2017-01-13 | Discharge: 2017-01-13 | Disposition: A | Discharge status: Home / Self Care | Payer: Medicare Other | Source: Ambulatory Visit | Attending: Family Medicine | Admitting: Family Medicine

## 2017-01-13 ENCOUNTER — Other Ambulatory Visit: Payer: Self-pay | Admitting: Family Medicine

## 2017-01-13 DIAGNOSIS — N6312 Unspecified lump in the right breast, upper inner quadrant: Secondary | ICD-10-CM | POA: Diagnosis not present

## 2017-01-13 DIAGNOSIS — N6320 Unspecified lump in the left breast, unspecified quadrant: Secondary | ICD-10-CM

## 2017-01-13 DIAGNOSIS — R922 Inconclusive mammogram: Secondary | ICD-10-CM | POA: Diagnosis not present

## 2017-01-18 ENCOUNTER — Ambulatory Visit
Admission: RE | Admit: 2017-01-18 | Discharge: 2017-01-18 | Disposition: A | Discharge status: Home / Self Care | Payer: Medicare Other | Source: Ambulatory Visit | Attending: Family Medicine | Admitting: Family Medicine

## 2017-01-18 ENCOUNTER — Other Ambulatory Visit: Payer: Self-pay | Admitting: Family Medicine

## 2017-01-18 DIAGNOSIS — R590 Localized enlarged lymph nodes: Secondary | ICD-10-CM | POA: Diagnosis not present

## 2017-01-18 DIAGNOSIS — N6321 Unspecified lump in the left breast, upper outer quadrant: Secondary | ICD-10-CM | POA: Diagnosis not present

## 2017-01-18 DIAGNOSIS — C50212 Malignant neoplasm of upper-inner quadrant of left female breast: Secondary | ICD-10-CM | POA: Diagnosis not present

## 2017-01-18 DIAGNOSIS — N6320 Unspecified lump in the left breast, unspecified quadrant: Secondary | ICD-10-CM

## 2017-01-18 DIAGNOSIS — Z170 Estrogen receptor positive status [ER+]: Secondary | ICD-10-CM | POA: Diagnosis not present

## 2017-01-18 DIAGNOSIS — C50412 Malignant neoplasm of upper-outer quadrant of left female breast: Secondary | ICD-10-CM | POA: Diagnosis not present

## 2017-01-18 DIAGNOSIS — N6322 Unspecified lump in the left breast, upper inner quadrant: Secondary | ICD-10-CM | POA: Diagnosis not present

## 2017-01-22 DIAGNOSIS — E119 Type 2 diabetes mellitus without complications: Secondary | ICD-10-CM | POA: Diagnosis not present

## 2017-01-22 DIAGNOSIS — H401131 Primary open-angle glaucoma, bilateral, mild stage: Secondary | ICD-10-CM | POA: Diagnosis not present

## 2017-01-22 DIAGNOSIS — H25813 Combined forms of age-related cataract, bilateral: Secondary | ICD-10-CM | POA: Diagnosis not present

## 2017-02-02 DIAGNOSIS — C50912 Malignant neoplasm of unspecified site of left female breast: Secondary | ICD-10-CM

## 2017-02-02 HISTORY — PX: BREAST BIOPSY: SHX20

## 2017-02-02 HISTORY — DX: Malignant neoplasm of unspecified site of left female breast: C50.912

## 2017-02-03 ENCOUNTER — Ambulatory Visit: Payer: Self-pay | Admitting: General Surgery

## 2017-02-03 DIAGNOSIS — C50912 Malignant neoplasm of unspecified site of left female breast: Secondary | ICD-10-CM | POA: Diagnosis not present

## 2017-02-08 ENCOUNTER — Telehealth: Payer: Self-pay | Admitting: Nurse Practitioner

## 2017-02-08 ENCOUNTER — Ambulatory Visit: Payer: Medicare Other | Attending: General Surgery | Admitting: Physical Therapy

## 2017-02-08 DIAGNOSIS — R293 Abnormal posture: Secondary | ICD-10-CM | POA: Diagnosis not present

## 2017-02-08 DIAGNOSIS — C50912 Malignant neoplasm of unspecified site of left female breast: Secondary | ICD-10-CM | POA: Diagnosis not present

## 2017-02-08 DIAGNOSIS — Z170 Estrogen receptor positive status [ER+]: Secondary | ICD-10-CM | POA: Insufficient documentation

## 2017-02-08 NOTE — Therapy (Signed)
[place], [place], [address] Phone: [phone]   Fax:  [phone]  Physical Therapy Evaluation  Patient Details  Name: Penny Huynh MRN: [id number] Date of Birth: 1933-08-27 Referring Provider: [name]   Encounter Date: 02/08/2017  PT End of Session - 02/08/17 2008    Visit Number  1    Number of Visits  2    Date for PT Re-Evaluation  04/08/17    PT Start Time  1518    PT Stop Time  1605    PT Time Calculation (min)  47 min    Activity Tolerance  Patient tolerated treatment well    Behavior During Therapy  [facility] for tasks assessed/performed       Past Medical History:  Diagnosis Date  . Coronary atherosclerosis    Minor nonobstructive CAD at cardiac catheterization 2009  . Dyslipidemia   . Femoral bruit 7/08   With normal ABIs  . Hypertension   . Lower extremity edema    Chronic  . Type 2 diabetes mellitus (HCC)     No past surgical history on file.  There were no vitals filed for this visit.   Subjective Assessment - 02/08/17 1529    Subjective  Doctor just sent me for physical therapy.    Pertinent History  Left breast cancer recently diagnosed, stage 2a.  Pt. expecting to have mastectomy (no date set) with sentinel lymph node biopsy to start and ALND if needed.  Doesn't yet know if she'll have other treatment. Diabetes under pretty good control with medication. HTN controlled with medication. Right shoulder arthroscopy some 20 years ago; no continuing problems.    Patient Stated Goals  get info about therapy-related issues around breast cancer surgery    Currently in Pain?  No/denies         [facility] PT Assessment - 02/08/17 0001      Assessment   Medical Diagnosis  left breast cancer, stage 2a    Referring Provider  [name]    Onset Date/Surgical Date  -- sugery date to be determined    Hand Dominance  Right    Prior Therapy  none      Precautions   Precautions  Other (comment)    Precaution Comments  active cancer      Restrictions   Weight Bearing Restrictions  No      Balance Screen   Has the patient fallen in the past 6 months  Yes    How many times?  1 tripped over raised cement    Has the patient had a decrease in activity level because of a fear of falling?   No    Is the patient reluctant to leave their home because of a fear of falling?   No      Home Film/video editor residence    Living Arrangements  Spouse/significant other    Type of Gray  Two level      Prior Function   Level of [place]  Retired    Leisure  not currently doing exercise,but was walking 2-3 miles 6 days a week plus exercised 45 minutes 5 days a week; hasn't done this for some months      Cognition   Overall Cognitive Status  Within Functional Limits for tasks assessed      Observation/Other Assessments  Observations  woman who looks healthy and younger than her age      Posture/Postural Control   Posture/Postural Control  Postural limitations    Postural Limitations  Forward head      ROM / Strength   AROM / PROM / Strength  AROM      AROM   AROM Assessment Site  Shoulder    Right/Left Shoulder  Right;Left    Right Shoulder Extension  51 Degrees    Right Shoulder Flexion  142 Degrees    Right Shoulder ABduction  165 Degrees    Right Shoulder Internal Rotation  58 Degrees    Right Shoulder External Rotation  90 Degrees    Left Shoulder Extension  58 Degrees    Left Shoulder Flexion  150 Degrees    Left Shoulder ABduction  169 Degrees    Left Shoulder Internal Rotation  60 Degrees    Left Shoulder External Rotation  90 Degrees        LYMPHEDEMA/ONCOLOGY QUESTIONNAIRE - 02/08/17 1544      Lymphedema Assessments   Lymphedema Assessments  Upper extremities      Right Upper Extremity Lymphedema   10 cm Proximal to Olecranon Process  28.2 cm     Olecranon Process  24.7 cm    10 cm Proximal to Ulnar Styloid Process  21.5 cm    Just Proximal to Ulnar Styloid Process  17.1 cm    Across Hand at [employer]  20 cm    At [place] of 2nd Digit  6.3 cm      Left Upper Extremity Lymphedema   10 cm Proximal to Olecranon Process  28.8 cm    Olecranon Process  24.2 cm    10 cm Proximal to Ulnar Styloid Process  20.8 cm    Just Proximal to Ulnar Styloid Process  16.4 cm    Across Hand at [employer]  19.6 cm    At [place] of 2nd Digit  6.1 cm          Objective measurements completed on examination: See above findings.              PT Education - 02/08/17 2007    Education provided  Yes    Education Details  post-op breast cancer exercises, ABC class info, initial lymphedema education, about decreased recurrence risk in research groups who exercised 3+hours/week    Person(s) Educated  Patient;Spouse    Methods  Explanation;Handout    Comprehension  Verbalized understanding           Breast Clinic Goals - 02/08/17 2024      Patient will be able to verbalize understanding of pertinent lymphedema risk reduction practices relevant to her diagnosis specifically related to skin care.   Status  Achieved      Patient will be able to return demonstrate and/or verbalize understanding of the post-op home exercise program related to regaining shoulder range of motion.   Status  Achieved      Patient will be able to verbalize understanding of the importance of attending the postoperative After Breast Cancer Class for further lymphedema risk reduction education and therapeutic exercise.   Status  Achieved            Plan - 02/08/17 2009    Clinical Impression Statement  This is a pleasant 84 year-old woman looking younger than her age with a recent diagnosis of left breast cancer, stage 2a. She expects to have mastectomy with SLNB (  possibly followed by ALND if warranted); date for this has not been set. She does not  know yet what other treatment may be recommended. She has been an avid exerciser in the past, but not in the past 6-12 months. She has good functional shoulder ROM bilaterally, though a h/o of right shoulder arthroscopy.     History and Personal Factors relevant to plan of care:  Diabetes    Clinical Presentation  Evolving    Clinical Presentation due to:  new cancer diagnosis and just starting treatment    Clinical Decision Making  Low    Rehab Potential  Good    PT Frequency  Other (comment) to have post-op reassessment    PT Duration  8 weeks    PT Treatment/Interventions  ADLs/Self Care Home Management;Therapeutic exercise;Patient/family education;Orthotic Fit/Training;Manual techniques;Passive range of motion    PT Next Visit Plan  Reassess status one month post-op for any further needs.    PT Home Exercise Plan  post-op shoulder ROM exercises once okayed by surgeon, to 90 degrees only for one week    Recommended Other Services  attend ABC class    Consulted and Agree with Plan of Care  Patient       Patient will benefit from skilled therapeutic intervention in order to improve the following deficits and impairments:  Postural dysfunction, Decreased knowledge of precautions, Decreased knowledge of use of DME  Visit Diagnosis: Malignant neoplasm of left breast in female, estrogen receptor positive, unspecified site of breast ([place]) - Plan: PT plan of care cert/re-cert  Abnormal posture - Plan: PT plan of care cert/re-cert     Problem List Patient Active Problem List   Diagnosis Date Noted  . Chest pain 07/18/2015  . Hypokalemia 07/18/2015  . Vertigo 07/18/2015  . LBBB 05/28/2009  . AODM 12/21/2007  . Essential hypertension, benign 12/21/2007  . DIASTOLIC DYSFUNCTION 94/80/1655  . FEMORAL BRUIT, RIGHT 12/21/2007  . DYSPNEA 12/21/2007    SALISBURY,DONNA 02/08/2017, 8:27 PM  [place], [place],  [address] Phone: [phone]   Fax:  [phone]  Name: KENNDRA MORRIS MRN: [id number] Date of Birth: January 17, 1934  Serafina Royals, PT 02/08/17 8:27 PM

## 2017-02-08 NOTE — Telephone Encounter (Signed)
Sp[oke with patient regarding upcoming appointment D/T/Loc/Ph# °

## 2017-02-10 DIAGNOSIS — C50212 Malignant neoplasm of upper-inner quadrant of left female breast: Secondary | ICD-10-CM | POA: Insufficient documentation

## 2017-02-10 DIAGNOSIS — C50412 Malignant neoplasm of upper-outer quadrant of left female breast: Secondary | ICD-10-CM | POA: Insufficient documentation

## 2017-02-10 DIAGNOSIS — Z170 Estrogen receptor positive status [ER+]: Secondary | ICD-10-CM | POA: Insufficient documentation

## 2017-02-11 ENCOUNTER — Inpatient Hospital Stay: Payer: Medicare Other

## 2017-02-11 ENCOUNTER — Telehealth: Payer: Self-pay | Admitting: Nurse Practitioner

## 2017-02-11 ENCOUNTER — Encounter: Payer: Self-pay | Admitting: Nurse Practitioner

## 2017-02-11 ENCOUNTER — Inpatient Hospital Stay: Payer: Medicare Other | Attending: Nurse Practitioner | Admitting: Nurse Practitioner

## 2017-02-11 VITALS — BP 162/76 | HR 62 | Temp 98.6°F | Resp 18 | Ht 64.5 in | Wt 158.2 lb

## 2017-02-11 DIAGNOSIS — E785 Hyperlipidemia, unspecified: Secondary | ICD-10-CM | POA: Diagnosis not present

## 2017-02-11 DIAGNOSIS — I2510 Atherosclerotic heart disease of native coronary artery without angina pectoris: Secondary | ICD-10-CM | POA: Diagnosis not present

## 2017-02-11 DIAGNOSIS — C50412 Malignant neoplasm of upper-outer quadrant of left female breast: Secondary | ICD-10-CM

## 2017-02-11 DIAGNOSIS — Z87891 Personal history of nicotine dependence: Secondary | ICD-10-CM | POA: Diagnosis not present

## 2017-02-11 DIAGNOSIS — D493 Neoplasm of unspecified behavior of breast: Secondary | ICD-10-CM

## 2017-02-11 DIAGNOSIS — C50212 Malignant neoplasm of upper-inner quadrant of left female breast: Secondary | ICD-10-CM | POA: Diagnosis not present

## 2017-02-11 DIAGNOSIS — Z79899 Other long term (current) drug therapy: Secondary | ICD-10-CM | POA: Diagnosis not present

## 2017-02-11 DIAGNOSIS — Z7982 Long term (current) use of aspirin: Secondary | ICD-10-CM | POA: Insufficient documentation

## 2017-02-11 DIAGNOSIS — I10 Essential (primary) hypertension: Secondary | ICD-10-CM | POA: Insufficient documentation

## 2017-02-11 DIAGNOSIS — Z170 Estrogen receptor positive status [ER+]: Secondary | ICD-10-CM | POA: Diagnosis not present

## 2017-02-11 DIAGNOSIS — E119 Type 2 diabetes mellitus without complications: Secondary | ICD-10-CM | POA: Insufficient documentation

## 2017-02-11 DIAGNOSIS — Z7984 Long term (current) use of oral hypoglycemic drugs: Secondary | ICD-10-CM | POA: Insufficient documentation

## 2017-02-11 LAB — CBC WITH DIFFERENTIAL (CANCER CENTER ONLY)
BASOS ABS: 0 10*3/uL (ref 0.0–0.1)
BASOS PCT: 1 %
EOS PCT: 1 %
Eosinophils Absolute: 0.1 10*3/uL (ref 0.0–0.5)
HEMATOCRIT: 38.7 % (ref 34.8–46.6)
Hemoglobin: 12.8 g/dL (ref 11.6–15.9)
LYMPHS PCT: 48 %
Lymphs Abs: 2 10*3/uL (ref 0.9–3.3)
MCH: 30.5 pg (ref 25.1–34.0)
MCHC: 33.1 g/dL (ref 31.5–36.0)
MCV: 92.1 fL (ref 79.5–101.0)
MONO ABS: 0.4 10*3/uL (ref 0.1–0.9)
MONOS PCT: 9 %
Neutro Abs: 1.7 10*3/uL (ref 1.5–6.5)
Neutrophils Relative %: 41 %
PLATELETS: 239 10*3/uL (ref 145–400)
RBC: 4.2 MIL/uL (ref 3.70–5.45)
RDW: 12.5 % (ref 11.2–16.1)
WBC Count: 4.2 10*3/uL (ref 3.9–10.3)

## 2017-02-11 LAB — CMP (CANCER CENTER ONLY)
ALBUMIN: 4.1 g/dL (ref 3.5–5.0)
ALK PHOS: 55 U/L (ref 40–150)
ALT: 16 U/L (ref 0–55)
AST: 19 U/L (ref 5–34)
Anion gap: 12 — ABNORMAL HIGH (ref 3–11)
BILIRUBIN TOTAL: 0.9 mg/dL (ref 0.2–1.2)
BUN: 9 mg/dL (ref 7–26)
CALCIUM: 9.6 mg/dL (ref 8.4–10.4)
CO2: 26 mmol/L (ref 22–29)
Chloride: 95 mmol/L — ABNORMAL LOW (ref 98–109)
Creatinine: 0.84 mg/dL (ref 0.60–1.10)
GFR, Est AFR Am: >60 mL/min (ref >60)
GFR, Estimated: >60 mL/min (ref >60)
GLUCOSE: 86 mg/dL (ref 70–140)
Potassium: 3.3 mmol/L (ref 3.3–4.7)
Sodium: 133 mmol/L — ABNORMAL LOW (ref 136–145)
TOTAL PROTEIN: 7.5 g/dL (ref 6.4–8.3)

## 2017-02-11 NOTE — Progress Notes (Addendum)
[place]  [phone] [phone] [place] Note   Patient Care Team: Sasser, Silvestre Moment, MD as PCP - General (Cardiology) Truitt Merle, MD as Consulting Physician (Hematology) Excell Seltzer, MD as Consulting Physician (General Surgery) Alla Feeling, NP as Nurse Practitioner (Nurse Practitioner) [name], MD as Attending Physician (Cardiology) 02/11/2017  CHIEF COMPLAINTS/PURPOSE OF CONSULTATION:  Left breast cancer, ER/PR positive, HER-2 positive  Referring physician: Excell Seltzer  Oncologic history   Carcinoma of upper-outer quadrant of left breast in female, estrogen receptor positive ([place] Hu[place]h)   01/13/2017 Mammogram    FINDINGS: In the left breast, a spiculated mass lies in the upper outer quadrant. In the medial posterior left breast, there is a larger lobulated mass. These masses correspond to the palpable abnormalities. No other discrete left breast masses.  In the right breast, there are no discrete masses. There are no areas of architectural distortion.  In both breasts there are scattered calcifications, rounded punctate, with a few other larger coarse dystrophic calcifications, without significant change from the previous screening mammogram, which is dated 12/04/2009.  Mammographic images were processed with CAD.  On physical exam, there is a firm palpable mass in the upper outer left breast and another firm mass in the medial left breast.       01/13/2017 Breast US    Targeted ultrasound is performed, showing an irregular hypoechoic shadowing mass in the left breast at the 2:30 o'clock position, 5 cm the nipple, measuring 3.3 x 2.8 x 2.6 cm. In the 10:30 o'clock position of the left breast, 5 cm the nipple, there is irregular hypoechoic mass with somewhat more lobulated margins, corresponding to the lobulated mass seen mammographically. This measures 4.8 x 2.9 x 4.8 cm. Both masses show  internal vascularity on color Doppler analysis.  Sonographic evaluation of the left axilla shows several nodes with thickened cortices. Status cortex measured is 5 mm. None of these lymph nodes appear enlarged but overall size criteria.  IMPRESSION: 1. Two masses in the left breast, 1 at the 2:30 o'clock position in the other at the 10:30 o'clock position, both highly suspicious for breast carcinoma. 2. Several abnormal left axillary lymph nodes with thickened cortices. 3. No evidence of right breast malignancy.       01/18/2017 Pathology Results    Diagnosis 1. Breast, left, needle core biopsy, 2:30 o'clock - INVASIVE MAMMARY CARCINOMA - MAMMARY CARCINOMA IN-SITU - SEE COMMENT 2. Lymph node, needle/core biopsy, left axilla - NO CARCINOMA IDENTIFIED - SEE COMMENT 3. Breast, left, needle core biopsy, 10:30 o'clock - INVASIVE DUCTAL CARCINOMA - SEE COMMENT  1. PROGNOSTIC INDICATORS Results: IMMUNOHISTOCHEMICAL AND MORPHOMETRIC ANALYSIS PERFORMED MANUALLY Estrogen Receptor: 100%, POSITIVE, STRONG STAINING INTENSITY Progesterone Receptor: 60%, POSITIVE, STRONG STAINING INTENSITY Proliferation Marker Ki67: 60%  1. FLUORESCENCE IN-SITU HYBRIDIZATION Results: HER2 - NEGATIVE RATIO OF HER2/CEP17 SIGNALS 1.28 AVERAGE HER2 COPY NUMBER PER CELL 2.75  3. PROGNOSTIC INDICATORS Results: IMMUNOHISTOCHEMICAL AND MORPHOMETRIC ANALYSIS PERFORMED MANUALLY Estrogen Receptor: 100%, POSITIVE, STRONG STAINING INTENSITY Progesterone Receptor: 50%, POSITIVE, STRONG STAINING INTENSITY Proliferation Marker Ki67: 70%  3. FLUORESCENCE IN-SITU HYBRIDIZATION Results: HER2 - **POSITIVE** RATIO OF HER2/CEP17 SIGNALS 2.80 AVERAGE HER2 COPY NUMBER PER CELL 6.15  Microscopic Comment 1. The biopsy material shows an infiltrative proliferation of cells with large vesicular nuclei with inconspicuous nucleoli, arranged linearly and in small clusters. Based on the biopsy, the carcinoma appears  Nottingham grade 2 of 3 and measures 0.9 cm in greatest linear extent. E-cadherin and prognostic markers (  ER/PR/ki-67/HER2-FISH)are pending and will be reported in an addendum. Dr. Lyndon Code reviewed the case and agrees with the above diagnosis.      02/10/2017 Initial Diagnosis    Carcinoma of upper-outer quadrant of left breast in female, estrogen receptor positive ([place])      HISTORY OF PRESENTING ILLNESS:  CLELIA Huynh 84 y.o. female is here because of the diagnosed left breast cancer, she presents with her husband.  The patient self palpated a left breast mass around 6 months ago, she thought it was calcium deposit as her mother had a history of this.  There was no pain at first but has become intermittently tender which prompted her to seek medical attention with her PCP.  She reports the palpable mass nearly doubled in that time period. No associated breast swelling or nipple discharge.  She reports a decreased appetite lately, denies fatigue or weight loss.  Diagnostic imaging shows an irregular hypoechoic mass in the left breast at the 2:30 o'clock position, 5 cm from the nipple, measuring 3.3 x 2.8 x 2.6 cm.  In the 10:30 o'clock position of the left breast, 5 cm from the nipple, there is irregular hypoechoic mass measuring 4.8 x 2.9 x 4.8 cm.  Ultrasound of the left axilla shows cortical thickening but no enlarged lymph nodes.  Left breast mass at 2:30 o'clock position is positive for invasive mammary carcinoma with mammary carcinoma in situ, ER/PR positive, HER-2 negative; left breast mass at 10:30 o'clock position is positive for invasive ductal carcinoma, ER/PR positive, HER-2 positive.  The biopsied lymph node was negative for carcinoma.  She was then referred to [name] for surgical consult.  Today she reports feeling well with intermittent productive nagging cough with clear phlegm; no wheezing or dyspnea.  Has occasional burning in hands and feet but not affecting function, she  attributes to long-term diabetes.  Denies breast or general pain, as well.  Denies palpitations, chest pain, or lower extremity edema.  In the past she has history of knee pain, hypertension, hyperlipidemia, CAD, and DM.  In the 1960s she was told she had an enlarged heart but was never told she had a diagnosis of heart failure, no MI or stroke.  She is followed by cardiologist Dr. Bronson Ing. Last echo in 2013 with EF 60-65% and grade I diastolic dysfunction.   She is retired from [employer] but has been recently enrolled in college courses to further her education. She previously exercised 6 days per week but decreased exercise over the last 6 months. She also works at [employer] during tax season. Lives at home with her spouse. She is independent of all ADLs, drives. She drinks alcohol occasionally; former smoker who quit in 2001, previously smoked 2 PPD x46 years.    GYN HISTORY  Menarchal: age [age] LMP: age 56 Contraceptive: none HRT: none GP: G2P0, has 2 grown (foster) children  MEDICAL HISTORY:  Past Medical History:  Diagnosis Date  . Coronary atherosclerosis    Minor nonobstructive CAD at cardiac catheterization 2009  . Dyslipidemia   . Femoral bruit 7/08   With normal ABIs  . Hypertension   . Lower extremity edema    Chronic  . Type 2 diabetes mellitus ([place])     SURGICAL HISTORY: History reviewed. No pertinent surgical history.  SOCIAL HISTORY: Social History   Socioeconomic History  . Marital status: Married    Spouse name: Not on file  . Number of children: 2  . Years of education: Not  on file  . Highest education level: Not on file  Social Needs  . Financial resource strain: Not on file  . Food insecurity - worry: Not on file  . Food insecurity - inability: Not on file  . Transportation needs - medical: Not on file  . Transportation needs - non-medical: Not on file  Occupational History  . Occupation: Counselling psychologist for [employer]     Comment: only works during tax season but not currently   . Occupation: in Dentist   . Occupation: retired Mining engineer of education     Comment: 1997  Tobacco Use  . Smoking status: Former Smoker    Packs/day: 2.00    Years: 46.00    Pack years: 92.00    Types: Cigarettes    Start date: 11/09/1953    Last attempt to quit: 02/03/1999    Years since quitting: 18.0  . Smokeless tobacco: Never Used  Substance and Sexual Activity  . Alcohol use: Yes    Alcohol/week: 0.0 oz    Comment: Occasionally-Homemade wine- AJ  . Drug use: No  . Sexual activity: Not on file  Other Topics Concern  . Not on file  Social History Narrative   Lives in [place] with her husband.   Has 2 grown children.    FAMILY HISTORY: Family History  Problem Relation Age of Onset  . Diabetes Mother   . Stroke Father   . Cancer Father        unknown type     ALLERGIES:  has No Known Allergies.  MEDICATIONS:  Current Outpatient Medications  Medication Sig Dispense Refill  . aspirin 81 MG EC tablet Take 81 mg by mouth daily.      . chlorthalidone (HYGROTON) 25 MG tablet Take 25 mg by mouth daily.     . Coenzyme Q10 (COQ10) 100 MG CAPS Take 100 mg by mouth daily.      Marland Kitchen losartan (COZAAR) 50 MG tablet Take 50 mg by mouth daily.    . metFORMIN (GLUCOPHAGE) 500 MG tablet Take 1,000 mg by mouth daily with breakfast.     . vitamin B-12 (CYANOCOBALAMIN) 500 MCG tablet Take 500 mcg by mouth daily.    . valsartan (DIOVAN) 160 MG tablet Take 160 mg by mouth daily.       No current facility-administered medications for this visit.     REVIEW OF SYSTEMS:   Constitutional: Denies fevers, chills or abnormal night sweats (+) decreased appetite Eyes: Denies blurriness of vision, double vision or watery eyes Ears, nose, mouth, throat, and face: Denies mucositis or sore throat Respiratory: Denies dyspnea or wheezes (+) intermittent nagging cough with clear sputum Cardiovascular: Denies palpitation, chest discomfort or lower  extremity swelling Gastrointestinal:  Denies nausea, vomiting, constipation, diarrhea, heartburn or change in bowel habits Skin: Denies abnormal skin rashes Lymphatics: Denies new lymphadenopathy or easy bruising Neurological:Denies numbness, tingling or new weaknesses (+) "burning" to hands and feet Behavioral/Psych: Mood is stable, no new changes  All other systems were reviewed with the patient and are negative.  PHYSICAL EXAMINATION: ECOG PERFORMANCE STATUS: 1 - Symptomatic but completely ambulatory  Vitals:   02/11/17 1406 02/11/17 1434  BP: (!) 178/70 (!) 162/76  Pulse: 62   Resp: 18   Temp: 98.6 F (37 C)   SpO2: 100%    Filed Weights   02/11/17 1406  Weight: 158 lb 3.2 oz (71.8 kg)    GENERAL:alert, no distress and comfortable SKIN: skin color, texture, turgor are normal, no  rashes or significant lesions EYES: normal, conjunctiva are pink and non-injected, sclera clear OROPHARYNX:no exudate, no erythema and lips, buccal mucosa, and tongue normal  NECK: supple, thyroid normal size, non-tender, without nodularity LYMPH:  no palpable cervical, supraclavicular, axillary, inguinal lymphadenopathy  LUNGS: clear to auscultation bilaterally with normal breathing effort HEART: regular rate & rhythm and no murmurs and no lower extremity edema ABDOMEN:abdomen soft, non-tender and normal bowel sounds.  No palpable hepatomegaly Musculoskeletal:no cyanosis of digits and no clubbing  PSYCH: alert & oriented x 3 with fluent speech NEURO: no focal motor/sensory deficits BREASTS: Inspection shows them to be symmetrical. No palpable mass in the right breast or axilla that I could appreciate. (+) Palpable nontender left breast mass at the 10:30 o'clock position with mild bruising, measures 8 x 5 cm. (+) Palpable nontender 5 cm left breast mass at the 2:30 o'clock position, with skin retraction inferior to the mass   LABORATORY DATA:  I have reviewed the data as listed CBC Latest Ref Rng  & Units 02/11/2017 07/18/2015 12/09/2007  WBC 4.0 - 10.5 K/uL - 4.2 3.3(L)  Hemoglobin 12.0 - 15.0 g/dL - 12.6 11.7(L)  Hematocrit 34.8 - 46.6 % 38.7 38.0 35.3(L)  Platelets 150 - 400 K/uL - 216 175   CMP Latest Ref Rng & Units 02/11/2017 07/18/2015 12/09/2007  Glucose 70 - 140 mg/dL 86 102(H) 158(H)  BUN 7 - 26 mg/dL 9 13 14   Creatinine 0.44 - 1.00 mg/dL - 0.78 0.79  Sodium 136 - 145 mmol/L 133(L) 131(L) 138  Potassium 3.3 - 4.7 mmol/L 3.3 3.1(L) 4.0  Chloride 98 - 109 mmol/L 95(L) 97(L) 103  CO2 22 - 29 mmol/L 26 27 28   Calcium 8.4 - 10.4 mg/dL 9.6 9.1 9.2  Total Protein 6.4 - 8.3 g/dL 7.5 - -  Total Bilirubin 0.2 - 1.2 mg/dL 0.9 - -  Alkaline Phos 40 - 150 U/L 55 - -  AST 5 - 34 U/L 19 - -  ALT 0 - 55 U/L 16 - -   Diagnosis 1. Breast, left, needle core biopsy, 2:30 o'clock - INVASIVE MAMMARY CARCINOMA - MAMMARY CARCINOMA IN-SITU - SEE COMMENT 2. Lymph node, needle/core biopsy, left axilla - NO CARCINOMA IDENTIFIED - SEE COMMENT 3. Breast, left, needle core biopsy, 10:30 o'clock - INVASIVE DUCTAL CARCINOMA - SEE COMMENT  1. PROGNOSTIC INDICATORS Results: IMMUNOHISTOCHEMICAL AND MORPHOMETRIC ANALYSIS PERFORMED MANUALLY Estrogen Receptor: 100%, POSITIVE, STRONG STAINING INTENSITY Progesterone Receptor: 60%, POSITIVE, STRONG STAINING INTENSITY Proliferation Marker Ki67: 60%  1. FLUORESCENCE IN-SITU HYBRIDIZATION Results: HER2 - NEGATIVE RATIO OF HER2/CEP17 SIGNALS 1.28 AVERAGE HER2 COPY NUMBER PER CELL 2.75  3. PROGNOSTIC INDICATORS Results: IMMUNOHISTOCHEMICAL AND MORPHOMETRIC ANALYSIS PERFORMED MANUALLY Estrogen Receptor: 100%, POSITIVE, STRONG STAINING INTENSITY Progesterone Receptor: 50%, POSITIVE, STRONG STAINING INTENSITY Proliferation Marker Ki67: 70%  3. FLUORESCENCE IN-SITU HYBRIDIZATION Results: HER2 - **POSITIVE** RATIO OF HER2/CEP17 SIGNALS 2.80 AVERAGE HER2 COPY NUMBER PER CELL 6.15  Microscopic Comment 1. The biopsy material shows an infiltrative  proliferation of cells with large vesicular nuclei with inconspicuous nucleoli, arranged linearly and in small clusters. Based on the biopsy, the carcinoma appears Nottingham grade 2 of 3 and measures 0.9 cm in greatest linear extent. E-cadherin and prognostic markers (ER/PR/ki-67/HER2-FISH)are pending and will be reported in an addendum. Dr. Lyndon Code reviewed the case and agrees with the above diagnosis.  RADIOGRAPHIC STUDIES: I have personally reviewed the radiological images as listed and agreed with the findings in the report. US Breast Ltd Uni Left Inc Axilla  Result Date: 01/13/2017 CLINICAL  DATA:  Patient presents reporting 2 palpable masses in the left breast which she has noted for "some time". EXAM: 2D DIGITAL DIAGNOSTIC BILATERAL MAMMOGRAM WITH CAD AND ADJUNCT TOMO ULTRASOUND LEFT BREAST COMPARISON:  Prior exams from 2011.  No more recent studies. ACR Breast Density Category c: The breast tissue is heterogeneously dense, which may obscure small masses. FINDINGS: In the left breast, a spiculated mass lies in the upper outer quadrant. In the medial posterior left breast, there is a larger lobulated mass. These masses correspond to the palpable abnormalities. No other discrete left breast masses. In the right breast, there are no discrete masses. There are no areas of architectural distortion. In both breasts there are scattered calcifications, rounded punctate, with a few other larger coarse dystrophic calcifications, without significant change from the previous screening mammogram, which is dated 12/04/2009. Mammographic images were processed with CAD. On physical exam, there is a firm palpable mass in the upper outer left breast and another firm mass in the medial left breast. Targeted ultrasound is performed, showing an irregular hypoechoic shadowing mass in the left breast at the 2:30 o'clock position, 5 cm the nipple, measuring 3.3 x 2.8 x 2.6 cm. In the 10:30 o'clock position of the left breast,  5 cm the nipple, there is irregular hypoechoic mass with somewhat more lobulated margins, corresponding to the lobulated mass seen mammographically. This measures 4.8 x 2.9 x 4.8 cm. Both masses show internal vascularity on color Doppler analysis. Sonographic evaluation of the left axilla shows several nodes with thickened cortices. Status cortex measured is 5 mm. None of these lymph nodes appear enlarged but overall size criteria. IMPRESSION: 1. Two masses in the left breast, 1 at the 2:30 o'clock position in the other at the 10:30 o'clock position, both highly suspicious for breast carcinoma. 2. Several abnormal left axillary lymph nodes with thickened cortices. 3. No evidence of right breast malignancy. RECOMMENDATION: 1. Ultrasound-guided core needle biopsy of the 2 left breast masses and 1 of the abnormal left axillary lymph nodes. This has been scheduled for 01/18/2017 at 7:30 a.m. I have discussed the findings and recommendations with the patient. Results were also provided in writing at the conclusion of the visit. If applicable, a reminder letter will be sent to the patient regarding the next appointment. BI-RADS CATEGORY  5: Highly suggestive of malignancy. Electronically Signed   By: Lajean Manes M.D.   On: 01/13/2017 16:37   Mm Diag Breast Tomo Bilateral  Result Date: 01/13/2017 CLINICAL DATA:  Patient presents reporting 2 palpable masses in the left breast which she has noted for "some time". EXAM: 2D DIGITAL DIAGNOSTIC BILATERAL MAMMOGRAM WITH CAD AND ADJUNCT TOMO ULTRASOUND LEFT BREAST COMPARISON:  Prior exams from 2011.  No more recent studies. ACR Breast Density Category c: The breast tissue is heterogeneously dense, which may obscure small masses. FINDINGS: In the left breast, a spiculated mass lies in the upper outer quadrant. In the medial posterior left breast, there is a larger lobulated mass. These masses correspond to the palpable abnormalities. No other discrete left breast masses. In  the right breast, there are no discrete masses. There are no areas of architectural distortion. In both breasts there are scattered calcifications, rounded punctate, with a few other larger coarse dystrophic calcifications, without significant change from the previous screening mammogram, which is dated 12/04/2009. Mammographic images were processed with CAD. On physical exam, there is a firm palpable mass in the upper outer left breast and another firm mass in the medial  left breast. Targeted ultrasound is performed, showing an irregular hypoechoic shadowing mass in the left breast at the 2:30 o'clock position, 5 cm the nipple, measuring 3.3 x 2.8 x 2.6 cm. In the 10:30 o'clock position of the left breast, 5 cm the nipple, there is irregular hypoechoic mass with somewhat more lobulated margins, corresponding to the lobulated mass seen mammographically. This measures 4.8 x 2.9 x 4.8 cm. Both masses show internal vascularity on color Doppler analysis. Sonographic evaluation of the left axilla shows several nodes with thickened cortices. Status cortex measured is 5 mm. None of these lymph nodes appear enlarged but overall size criteria. IMPRESSION: 1. Two masses in the left breast, 1 at the 2:30 o'clock position in the other at the 10:30 o'clock position, both highly suspicious for breast carcinoma. 2. Several abnormal left axillary lymph nodes with thickened cortices. 3. No evidence of right breast malignancy. RECOMMENDATION: 1. Ultrasound-guided core needle biopsy of the 2 left breast masses and 1 of the abnormal left axillary lymph nodes. This has been scheduled for 01/18/2017 at 7:30 a.m. I have discussed the findings and recommendations with the patient. Results were also provided in writing at the conclusion of the visit. If applicable, a reminder letter will be sent to the patient regarding the next appointment. BI-RADS CATEGORY  5: Highly suggestive of malignancy. Electronically Signed   By: Lajean Manes M.D.    On: 01/13/2017 16:37   [place] Axillary Node Core Biopsy Left  Addendum Date: 01/20/2017   ADDENDUM REPORT: 01/20/2017 12:51 ADDENDUM: Pathology revealed GRADE II INVASIVE MAMMARY CARCINOMA, MAMMARY CARCINOMA IN-SITU of the Left breast, 2:30 o'clock. GRADE III INVASIVE DUCTAL CARCINOMA of the Left breast, 10:30 o'clock. NO CARCINOMA IDENTIFIED of the Left axilla. This was found to be concordant by Dr. Claudie Revering. Pathology results were discussed with the patient by telephone. The patient reported doing well after the biopsies with tenderness at the sites. Post biopsy instructions and care were reviewed and questions were answered. The patient was encouraged to call [facility] for any additional concerns. Surgical consultation has been arranged with Dr. Adonis Housekeeper at [facility] on February 04, 2016. Pathology results reported by Terie Purser, RN on 01/20/2017. Electronically Signed   By: Claudie Revering M.D.   On: 01/20/2017 12:51   Result Date: 01/20/2017 CLINICAL DATA:  Multiple left axillary lymph nodes with mild cortical thickening at recent ultrasound, suspicious for metastatic nodes. EXAM: [place] AXILLARY NODE CORE BIOPSY LEFT COMPARISON:  Previous exam(s). FINDINGS: I met with the patient and we discussed the procedure of ultrasound-guided biopsy, including benefits and alternatives. We discussed the high likelihood of a successful procedure. We discussed the risks of the procedure, including infection, bleeding, tissue injury, clip migration, and inadequate sampling. Informed written consent was given. The usual time-out protocol was performed immediately prior to the procedure. Using sterile technique and 1% Lidocaine as local anesthetic, under direct ultrasound visualization, a 14 gauge spring-loaded device was used to perform biopsy of one of the recently demonstrated left axillary lymph nodes with mild cortical thickening using a caudal approach. At the conclusion  of the procedure a HydroMARK tissue marker clip was deployed into the biopsy cavity. Follow up 2 view mammogram was performed and dictated separately. IMPRESSION: Ultrasound guided biopsy of one of the recently demonstrated left axillary lymph nodes with mild cortical thickening. No apparent complications. Electronically Signed: By: Claudie Revering M.D. On: 01/18/2017 09:01   Mm Clip Placement Left  Result Date: 01/18/2017  CLINICAL DATA:  Status post ultrasound-guided core needle biopsies of masses in the 2:30 o'clock and 10:30 o'clock positions of the left breast and a left axillary lymph node. EXAM: DIAGNOSTIC LEFT MAMMOGRAM POST ULTRASOUND BIOPSY COMPARISON:  Previous exam(s). FINDINGS: Mammographic images were obtained following ultrasound guided biopsy of masses in the 2:30 o'clock and 10:30 o'clock positions of the left breast and a left axillary lymph node. These demonstrate a coil shaped biopsy marker clip in the biopsied mass in the 10:30 o'clock position of the left breast. There is a ribbon shaped biopsy marker clip in the biopsied mass in the 2:30 o'clock position of the left breast. The spiral shaped HydroMARK clip in the biopsied left axillary lymph node is not included. IMPRESSION: Appropriate deployment of the coil shaped and ribbon shaped biopsy marker clips. Final Assessment: Post Procedure Mammograms for Marker Placement Electronically Signed   By: Claudie Revering M.D.   On: 01/18/2017 09:20   [place] Lt Breast Bx W Loc Dev 1st Lesion Img Bx Spec US Guide  Addendum Date: 01/20/2017   ADDENDUM REPORT: 01/20/2017 12:51 ADDENDUM: Pathology revealed GRADE II INVASIVE MAMMARY CARCINOMA, MAMMARY CARCINOMA IN-SITU of the Left breast, 2:30 o'clock. GRADE III INVASIVE DUCTAL CARCINOMA of the Left breast, 10:30 o'clock. NO CARCINOMA IDENTIFIED of the Left axilla. This was found to be concordant by Dr. Claudie Revering. Pathology results were discussed with the patient by telephone. The patient reported doing well  after the biopsies with tenderness at the sites. Post biopsy instructions and care were reviewed and questions were answered. The patient was encouraged to call [facility] for any additional concerns. Surgical consultation has been arranged with Dr. Adonis Housekeeper at [facility] on February 04, 2016. Pathology results reported by Terie Purser, RN on 01/20/2017. Electronically Signed   By: Claudie Revering M.D.   On: 01/20/2017 12:51   Result Date: 01/20/2017 CLINICAL DATA:  3.3 cm mass in the 2:30 o'clock position of the left breast at recent mammography and ultrasound. EXAM: ULTRASOUND GUIDED LEFT BREAST CORE NEEDLE BIOPSY COMPARISON:  Previous exam(s). FINDINGS: I met with the patient and we discussed the procedure of ultrasound-guided biopsy, including benefits and alternatives. We discussed the high likelihood of a successful procedure. We discussed the risks of the procedure, including infection, bleeding, tissue injury, clip migration, and inadequate sampling. Informed written consent was given. The usual time-out protocol was performed immediately prior to the procedure. Lesion quadrant: Upper outer quadrant Using sterile technique and 1% Lidocaine as local anesthetic, under direct ultrasound visualization, a 14 gauge spring-loaded device was used to perform biopsy of the recently demonstrated 3.3 cm mass in the 2:30 o'clock position of the left breast using a caudal approach. At the conclusion of the procedure a ribbon shaped tissue marker clip was deployed into the biopsy cavity. Follow up 2 view mammogram was performed and dictated separately. IMPRESSION: Ultrasound guided biopsy of a 3.3 cm mass in the 2:30 o'clock position of the left breast. No apparent complications. Electronically Signed: By: Claudie Revering M.D. On: 01/18/2017 08:57   [place] Lt Breast Bx W Loc Dev Ea Add Lesion Img Bx Spec US Guide  Addendum Date: 01/20/2017   ADDENDUM REPORT: 01/20/2017 12:50  ADDENDUM: Pathology revealed GRADE II INVASIVE MAMMARY CARCINOMA, MAMMARY CARCINOMA IN-SITU of the Left breast, 2:30 o'clock. GRADE III INVASIVE DUCTAL CARCINOMA of the Left breast, 10:30 o'clock. NO CARCINOMA IDENTIFIED of the Left axilla. This was found to be concordant by Dr. Claudie Revering. Pathology  results were discussed with the patient by telephone. The patient reported doing well after the biopsies with tenderness at the sites. Post biopsy instructions and care were reviewed and questions were answered. The patient was encouraged to call [facility] for any additional concerns. Surgical consultation has been arranged with Dr. Adonis Housekeeper at [facility] on February 04, 2016. Pathology results reported by Terie Purser, RN on 01/20/2017. Electronically Signed   By: Claudie Revering M.D.   On: 01/20/2017 12:50   Result Date: 01/20/2017 CLINICAL DATA:  4.8 cm mass in the 10:30 o'clock position of the left breast at recent mammography and ultrasound, suspicious for malignancy. EXAM: ULTRASOUND GUIDED LEFT BREAST CORE NEEDLE BIOPSY COMPARISON:  Previous exam(s). FINDINGS: I met with the patient and we discussed the procedure of ultrasound-guided biopsy, including benefits and alternatives. We discussed the high likelihood of a successful procedure. We discussed the risks of the procedure, including infection, bleeding, tissue injury, clip migration, and inadequate sampling. Informed written consent was given. The usual time-out protocol was performed immediately prior to the procedure. Lesion quadrant: Upper inner quadrant Using sterile technique and 1% Lidocaine as local anesthetic, under direct ultrasound visualization, a 12 gauge spring-loaded device was used to perform biopsy of the recently demonstrated 4.8 cm mass in the 10:30 o'clock position of the left breast using a caudal approach. At the conclusion of the procedure a coil shaped tissue marker clip was deployed into  the biopsy cavity. Follow up 2 view mammogram was performed and dictated separately. IMPRESSION: Ultrasound guided biopsy of a 4.8 cm mass in the 10:30 o'clock position of the left breast. No apparent complications. Electronically Signed: By: Claudie Revering M.D. On: 01/18/2017 09:00    ASSESSMENT & PLAN: Hortencia Martire is an 84 year old with past medical history positive for HTN, DM, CAD now with newly diagnosed left breast cancer  1.  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 favors mastectomy -We discussed the potential benefit of neoadjuvant chemotherapy to shrink the tumor and decrease risk of recurrence after surgery. She will consider it. -We discussed the small possibility her breast cancer does not respond to neoadjuvant chemotherapy, we will monitor her closely with physical exam; if the tumor does not shrink or stays the same, will get interim imaging and likely proceed straight to surgery -We discussed benefit of adjuvant chemotherapy after she recovers from surgery depending on pathology findings, such as taxol plus anti-HER2 therapy with Herceptin. If she has node-positive disease on surgical pathology and she recovers well from surgery, [name] recommends adding Carboplatin.  -Despite her age she is physically active and would potentially be a good candidate for chemotherapy.  -Chemotherapy consent: Side effects including but not not limited to, fatigue, nausea, vomiting, diarrhea, hair loss, neuropathy, fluid retention, renal and  kidney dysfunction, neutropenic fever, needed for blood transfusion, bleeding, were discussed with patient in great detail. She agrees to proceed. -Will as [name] to place [facility] during her breast surgery.  -We discussed she will likely need adjuvant radiation after chemotherapy, will refer appropriately  -  Given the strong ER and PR positivity, she is a candidate for adjuvant endocrine therapy to reduce her risk of cancer recurrence,  The potential benefit and side effects including but not limited to hot flash, skin and vaginal dryness, metabolic changes ( increased blood glucose, cholesterol, weight, etc.), slightly in increased risk of cardiovascular disease, cataracts, muscular and joint discomfort, osteopenia and osteoporosis, etc, were discussed with her in great details. She is interested, and we'll start after she completes radiation. -Due to her cardiovascular comorbidities, she will need an echo and f/u with cardiologist -She will attend chemo class, will check baseline labs today -Due to her HER2 positive disease, will obtain CT CAP and bone scan to complete staging work up -if she decides not to pursue neoadjuvant chemotherapy, we will see her 2 weeks after breast surgery, she will call to inform us of her decision  2. HTN, CAD, DM -BP is elevated today, 162/76; she reports it is usually well controlled on losartan -Will obtain echo prior to beginning herceptin therapy, we reviewed the risk of worsening cardiac function and potentially heart failure, she accepts the risk -For Dm she takes metformin once daily, BG 86 on Cmet today -I recommend she maintain close f/u with her PCP and cardiologist Dr. Bronson Ing throughout cancer treatment  -I recommend she try to eat well and be active as tolerated   PLAN -Lab today -Chemo class, echo, CT CAP, Bone scan in 1 week -patient to consider neoadjuvant chemotherapy, if she agrees, hope to begin in approx 2 weeks when staging work up is  complete; if not, will f/u 2 weeks after surgery to finalize her adjuvant chemo.   Orders Placed This Encounter  Procedures  . CT Abdomen Pelvis W Contrast    Standing Status:   Future    Standing Expiration Date:   02/11/2018    Order Specific Question:   If indicated for the ordered procedure, I authorize the administration of contrast media per Radiology protocol    Answer:   Yes    Order Specific Question:   Preferred imaging location?    Answer:   [facility]    Order Specific Question:   Radiology Contrast Protocol - do NOT remove file path    Answer:   \\[email or web address].pdf  . CT Chest W Contrast    Standing Status:   Future    Standing Expiration Date:   02/11/2018    Order Specific Question:   If indicated for the ordered procedure, I authorize the administration of contrast media per Radiology protocol    Answer:   Yes    Order Specific Question:   Preferred imaging location?    Answer:   [facility]    Order Specific Question:   Radiology Contrast Protocol - do NOT remove file path    Answer:   \\[email or web address].pdf  . NM Bone Scan Whole Body    Standing Status:   Future    Standing Expiration Date:   02/11/2018    Order Specific Question:   If indicated for the ordered procedure, I authorize the administration of a radiopharmaceutical per Radiology protocol    Answer:   Yes    Order Specific Question:   Preferred imaging location?    Answer:   [facility]    Order Specific Question:   Radiology Contrast Protocol - do NOT remove file path    Answer:   \\[email or web address]\[email or web address]  . CBC with Differential ([place] Only)    Standing  Status:   Standing    Number of Occurrences:   50    Standing Expiration Date:   02/12/2023  . CMP ([place] only)    Standing Status:   Standing    Number of Occurrences:   50    Standing Expiration Date:   02/12/2023  . ECHOCARDIOGRAM COMPLETE      Standing Status:   Future    Standing Expiration Date:   05/13/2018    Scheduling Instructions:     [facility] [place], with Dr. Bronson Ing    Order Specific Question:   Where should this test be performed    Answer:   [facility]    Order Specific Question:   Perflutren DEFINITY (image enhancing agent) should be administered unless hypersensitivity or allergy exist    Answer:   Administer Perflutren    Order Specific Question:   Expected Date:    Answer:   1 week    All questions were answered. The patient knows to call the clinic with any problems, questions or concerns. I spent 55 minutes counseling the patient face to face. The total time spent in the appointment was 60 minutes and more than 50% was on counseling.     Alla Feeling, NP 02/11/2017 11:46 PM  Addendum  I have seen the patient, examined her. I agree with the assessment and and plan and have edited the notes.   Mrs Tagliaferro is a 84 yo AAF with PMH of well controlled HTN,DM and CAD,but otherwise very active and independent, presented with a large palpable 8cm mass at UIQ, triple positive, and a 5cm mass in UOQ, ER+/PR+/HER2-. Node biopsy was negative. I recommend staging CT and bone scan to rule out distant metastasis.  We discussed HER-2 positive cancer is more aggressive, given the large size of the tumor, the risk of recurrence is high.  I recommend her to consider adjuvant chemotherapy, TCH or weekly Taxol and Herceptin, followed by Herceptin maintenance therapy, to reduce her risk of recurrence.  We also discussed neoadjuvant chemotherapy.  due to the large tumors in different quadrant, she would need mastectomy even after neoadjuvant chemotherapy.  Patient will think about, and let us know her decision on neoadjuvant versus adjuvant chemotherapy.  10-year-old and has a medical comorbidities, but she is very active and fit, would be a candidate for chemotherapy.  She is agreeable with chemotherapy.  She will have a port placed.   Due to her previous cardiac history, I recommend her to follow-up with her cardiologist to have a updated echocardiogram.  Further plan depends on her decision on neoadjuvant versus adjuvant chemotherapy.  Truitt Merle  02/11/2017

## 2017-02-11 NOTE — Telephone Encounter (Signed)
Scheduled appt per 1/10 los - Scans to be scheduled by [facility] - Gave patient AVS and calender per los.

## 2017-02-11 NOTE — Patient Instructions (Signed)
Paclitaxel injection What is this medicine? PACLITAXEL (PAK li TAX el) is a chemotherapy drug. It targets fast dividing cells, like cancer cells, and causes these cells to die. This medicine is used to treat ovarian cancer, breast cancer, and other cancers. This medicine may be used for other purposes; ask your health care provider or pharmacist if you have questions. COMMON BRAND NAME(S): Onxol, Taxol What should I tell my health care provider before I take this medicine? They need to know if you have any of these conditions: -blood disorders -irregular heartbeat -infection (especially a virus infection such as chickenpox, cold sores, or herpes) -liver disease -previous or ongoing radiation therapy -an unusual or allergic reaction to paclitaxel, alcohol, polyoxyethylated castor oil, other chemotherapy agents, other medicines, foods, dyes, or preservatives -pregnant or trying to get pregnant -breast-feeding How should I use this medicine? This drug is given as an infusion into a vein. It is administered in a hospital or clinic by a specially trained health care professional. Talk to your pediatrician regarding the use of this medicine in children. Special care may be needed. Overdosage: If you think you have taken too much of this medicine contact a poison control center or emergency room at once. NOTE: This medicine is only for you. Do not share this medicine with others. What if I miss a dose? It is important not to miss your dose. Call your doctor or health care professional if you are unable to keep an appointment. What may interact with this medicine? Do not take this medicine with any of the following medications: -disulfiram -metronidazole This medicine may also interact with the following medications: -cyclosporine -diazepam -ketoconazole -medicines to increase blood counts like filgrastim, pegfilgrastim, sargramostim -other chemotherapy drugs like cisplatin, doxorubicin,  epirubicin, etoposide, teniposide, vincristine -quinidine -testosterone -vaccines -verapamil Talk to your doctor or health care professional before taking any of these medicines: -acetaminophen -aspirin -ibuprofen -ketoprofen -naproxen This list may not describe all possible interactions. Give your health care provider a list of all the medicines, herbs, non-prescription drugs, or dietary supplements you use. Also tell them if you smoke, drink alcohol, or use illegal drugs. Some items may interact with your medicine. What should I watch for while using this medicine? Your condition will be monitored carefully while you are receiving this medicine. You will need important blood work done while you are taking this medicine. This medicine can cause serious allergic reactions. To reduce your risk you will need to take other medicine(s) before treatment with this medicine. If you experience allergic reactions like skin rash, itching or hives, swelling of the face, lips, or tongue, tell your doctor or health care professional right away. In some cases, you may be given additional medicines to help with side effects. Follow all directions for their use. This drug may make you feel generally unwell. This is not uncommon, as chemotherapy can affect healthy cells as well as cancer cells. Report any side effects. Continue your course of treatment even though you feel ill unless your doctor tells you to stop. Call your doctor or health care professional for advice if you get a fever, chills or sore throat, or other symptoms of a cold or flu. Do not treat yourself. This drug decreases your body's ability to fight infections. Try to avoid being around people who are sick. This medicine may increase your risk to bruise or bleed. Call your doctor or health care professional if you notice any unusual bleeding. Be careful brushing and flossing your teeth or   using a toothpick because you may get an infection or  bleed more easily. If you have any dental work done, tell your dentist you are receiving this medicine. Avoid taking products that contain aspirin, acetaminophen, ibuprofen, naproxen, or ketoprofen unless instructed by your doctor. These medicines may hide a fever. Do not become pregnant while taking this medicine. Women should inform their doctor if they wish to become pregnant or think they might be pregnant. There is a potential for serious side effects to an unborn child. Talk to your health care professional or pharmacist for more information. Do not breast-feed an infant while taking this medicine. Men are advised not to father a child while receiving this medicine. This product may contain alcohol. Ask your pharmacist or healthcare provider if this medicine contains alcohol. Be sure to tell all healthcare providers you are taking this medicine. Certain medicines, like metronidazole and disulfiram, can cause an unpleasant reaction when taken with alcohol. The reaction includes flushing, headache, nausea, vomiting, sweating, and increased thirst. The reaction can last from 30 minutes to several hours. What side effects may I notice from receiving this medicine? Side effects that you should report to your doctor or health care professional as soon as possible: -allergic reactions like skin rash, itching or hives, swelling of the face, lips, or tongue -low blood counts - This drug may decrease the number of white blood cells, red blood cells and platelets. You may be at increased risk for infections and bleeding. -signs of infection - fever or chills, cough, sore throat, pain or difficulty passing urine -signs of decreased platelets or bleeding - bruising, pinpoint red spots on the skin, black, tarry stools, nosebleeds -signs of decreased red blood cells - unusually weak or tired, fainting spells, lightheadedness -breathing problems -chest pain -high or low blood pressure -mouth sores -nausea and  vomiting -pain, swelling, redness or irritation at the injection site -pain, tingling, numbness in the hands or feet -slow or irregular heartbeat -swelling of the ankle, feet, hands Side effects that usually do not require medical attention (report to your doctor or health care professional if they continue or are bothersome): -bone pain -complete hair loss including hair on your head, underarms, pubic hair, eyebrows, and eyelashes -changes in the color of fingernails -diarrhea -loosening of the fingernails -loss of appetite -muscle or joint pain -red flush to skin -sweating This list may not describe all possible side effects. Call your doctor for medical advice about side effects. You may report side effects to FDA at 1-800-FDA-1088. Where should I keep my medicine? This drug is given in a hospital or clinic and will not be stored at home. NOTE: This sheet is a summary. It may not cover all possible information. If you have questions about this medicine, talk to your doctor, pharmacist, or health care provider.  2018 Elsevier/Gold Standard (2014-11-20 19:58:00) Trastuzumab injection for infusion What is this medicine? TRASTUZUMAB (tras TOO zoo mab) is a monoclonal antibody. It is used to treat breast cancer and stomach cancer. This medicine may be used for other purposes; ask your health care provider or pharmacist if you have questions. COMMON BRAND NAME(S): Herceptin What should I tell my health care provider before I take this medicine? They need to know if you have any of these conditions: -heart disease -heart failure -lung or breathing disease, like asthma -an unusual or allergic reaction to trastuzumab, benzyl alcohol, or other medications, foods, dyes, or preservatives -pregnant or trying to get pregnant -breast-feeding How should I   use this medicine? This drug is given as an infusion into a vein. It is administered in a hospital or clinic by a specially trained health  care professional. Talk to your pediatrician regarding the use of this medicine in children. This medicine is not approved for use in children. Overdosage: If you think you have taken too much of this medicine contact a poison control center or emergency room at once. NOTE: This medicine is only for you. Do not share this medicine with others. What if I miss a dose? It is important not to miss a dose. Call your doctor or health care professional if you are unable to keep an appointment. What may interact with this medicine? This medicine may interact with the following medications: -certain types of chemotherapy, such as daunorubicin, doxorubicin, epirubicin, and idarubicin This list may not describe all possible interactions. Give your health care provider a list of all the medicines, herbs, non-prescription drugs, or dietary supplements you use. Also tell them if you smoke, drink alcohol, or use illegal drugs. Some items may interact with your medicine. What should I watch for while using this medicine? Visit your doctor for checks on your progress. Report any side effects. Continue your course of treatment even though you feel ill unless your doctor tells you to stop. Call your doctor or health care professional for advice if you get a fever, chills or sore throat, or other symptoms of a cold or flu. Do not treat yourself. Try to avoid being around people who are sick. You may experience fever, chills and shaking during your first infusion. These effects are usually mild and can be treated with other medicines. Report any side effects during the infusion to your health care professional. Fever and chills usually do not happen with later infusions. Do not become pregnant while taking this medicine or for 7 months after stopping it. Women should inform their doctor if they wish to become pregnant or think they might be pregnant. Women of child-bearing potential will need to have a negative pregnancy  test before starting this medicine. There is a potential for serious side effects to an unborn child. Talk to your health care professional or pharmacist for more information. Do not breast-feed an infant while taking this medicine or for 7 months after stopping it. Women must use effective birth control with this medicine. What side effects may I notice from receiving this medicine? Side effects that you should report to your doctor or health care professional as soon as possible: -allergic reactions like skin rash, itching or hives, swelling of the face, lips, or tongue -chest pain or palpitations -cough -dizziness -feeling faint or lightheaded, falls -fever -general ill feeling or flu-like symptoms -signs of worsening heart failure like breathing problems; swelling in your legs and feet -unusually weak or tired Side effects that usually do not require medical attention (report to your doctor or health care professional if they continue or are bothersome): -bone pain -changes in taste -diarrhea -joint pain -nausea/vomiting -weight loss This list may not describe all possible side effects. Call your doctor for medical advice about side effects. You may report side effects to FDA at 1-800-FDA-1088. Where should I keep my medicine? This drug is given in a hospital or clinic and will not be stored at home. NOTE: This sheet is a summary. It may not cover all possible information. If you have questions about this medicine, talk to your doctor, pharmacist, or health care provider.  2018 Elsevier/Gold Standard (2016-01-14   14:37:52)  

## 2017-02-12 ENCOUNTER — Encounter: Payer: Self-pay | Admitting: *Deleted

## 2017-02-12 ENCOUNTER — Ambulatory Visit: Payer: Self-pay | Admitting: General Surgery

## 2017-02-12 DIAGNOSIS — C50912 Malignant neoplasm of unspecified site of left female breast: Secondary | ICD-10-CM | POA: Diagnosis not present

## 2017-02-13 ENCOUNTER — Encounter: Payer: Self-pay | Admitting: Nurse Practitioner

## 2017-02-15 ENCOUNTER — Telehealth: Payer: Self-pay | Admitting: *Deleted

## 2017-02-15 ENCOUNTER — Other Ambulatory Visit: Payer: Self-pay | Admitting: General Surgery

## 2017-02-15 DIAGNOSIS — C50912 Malignant neoplasm of unspecified site of left female breast: Secondary | ICD-10-CM

## 2017-02-15 NOTE — Telephone Encounter (Signed)
Pt states she would like to speak with Lacie or Dr Burr Medico. Wants to know if there is any treatment other than chemotherapy.

## 2017-02-15 NOTE — Telephone Encounter (Signed)
I have called pt back and answered her questions. She has decided to have surgery first, which is scheduled for 1/22. She will have echo and CT/Bone scan next week. I plan to see her back in 2 weeks after surgery. Will move her chemo class to after surgery.   Truitt Merle MD

## 2017-02-16 ENCOUNTER — Telehealth: Payer: Self-pay | Admitting: Hematology

## 2017-02-16 ENCOUNTER — Ambulatory Visit: Payer: Self-pay | Admitting: General Surgery

## 2017-02-16 NOTE — Telephone Encounter (Signed)
Spoke to patient regarding upcoming February appointments per 1/15 sch message

## 2017-02-17 ENCOUNTER — Other Ambulatory Visit: Payer: Medicare Other

## 2017-02-17 ENCOUNTER — Ambulatory Visit (HOSPITAL_COMMUNITY)
Admission: RE | Admit: 2017-02-17 | Discharge: 2017-02-17 | Disposition: A | Discharge status: Home / Self Care | Payer: Medicare Other | Source: Ambulatory Visit | Attending: Nurse Practitioner | Admitting: Nurse Practitioner

## 2017-02-17 DIAGNOSIS — Z01810 Encounter for preprocedural cardiovascular examination: Secondary | ICD-10-CM | POA: Diagnosis not present

## 2017-02-17 DIAGNOSIS — C50412 Malignant neoplasm of upper-outer quadrant of left female breast: Secondary | ICD-10-CM | POA: Diagnosis not present

## 2017-02-17 DIAGNOSIS — I700 Atherosclerosis of aorta: Secondary | ICD-10-CM | POA: Diagnosis not present

## 2017-02-17 DIAGNOSIS — Z170 Estrogen receptor positive status [ER+]: Secondary | ICD-10-CM | POA: Insufficient documentation

## 2017-02-17 DIAGNOSIS — I4510 Unspecified right bundle-branch block: Secondary | ICD-10-CM | POA: Diagnosis not present

## 2017-02-17 DIAGNOSIS — Z87891 Personal history of nicotine dependence: Secondary | ICD-10-CM | POA: Diagnosis not present

## 2017-02-17 DIAGNOSIS — I209 Angina pectoris, unspecified: Secondary | ICD-10-CM | POA: Diagnosis not present

## 2017-02-17 DIAGNOSIS — Z01812 Encounter for preprocedural laboratory examination: Secondary | ICD-10-CM | POA: Diagnosis not present

## 2017-02-17 DIAGNOSIS — C50212 Malignant neoplasm of upper-inner quadrant of left female breast: Secondary | ICD-10-CM | POA: Diagnosis not present

## 2017-02-17 DIAGNOSIS — I10 Essential (primary) hypertension: Secondary | ICD-10-CM | POA: Insufficient documentation

## 2017-02-17 DIAGNOSIS — J439 Emphysema, unspecified: Secondary | ICD-10-CM | POA: Diagnosis not present

## 2017-02-17 DIAGNOSIS — I723 Aneurysm of iliac artery: Secondary | ICD-10-CM | POA: Insufficient documentation

## 2017-02-17 DIAGNOSIS — Z01818 Encounter for other preprocedural examination: Secondary | ICD-10-CM | POA: Diagnosis not present

## 2017-02-17 DIAGNOSIS — R918 Other nonspecific abnormal finding of lung field: Secondary | ICD-10-CM | POA: Insufficient documentation

## 2017-02-17 DIAGNOSIS — E119 Type 2 diabetes mellitus without complications: Secondary | ICD-10-CM | POA: Insufficient documentation

## 2017-02-17 NOTE — Progress Notes (Signed)
  Echocardiogram 2D Echocardiogram has been performed.  Pernie Grosso R 12/08/2017, 10:08 AM 

## 2017-02-17 NOTE — Pre-Procedure Instructions (Addendum)
Penny Huynh  02/17/2017    Your procedure is scheduled on Tuesday, February 23, 2017 at 7:30 AM.   Report to [facility] Entrance "A" Admitting Office at 5:30 AM.   Call this number if you have problems the morning of surgery: [phone]   Questions prior to day of surgery, please call [phone] between 8 & 4 PM.   Remember:  Do not eat food or drink liquids after midnight Monday, 02/22/17.  Do not take any of your medications the morning of surgery.  Stop Aspirin and CoQ10 as of today.  Drink bottle of Ensure Pre-surgery just before you leave for the hospital day of surgery.   How to Manage Your Diabetes Before Surgery   Why is it important to control my blood sugar before and after surgery?   Improving blood sugar levels before and after surgery helps healing and can limit problems.  A way of improving blood sugar control is eating a healthy diet by:  - Eating less sugar and carbohydrates  - Increasing activity/exercise  - Talk with your doctor about reaching your blood sugar goals  High blood sugars (greater than 180 mg/dL) can raise your risk of infections and slow down your recovery so you will need to focus on controlling your diabetes during the weeks before surgery.  Make sure that the doctor who takes care of your diabetes knows about your planned surgery including the date and location.  How do I manage my blood sugars before surgery?   Check your blood sugar at least 4 times a day, 2 days before surgery to make sure that they are not too high or low.  Check your blood sugar the morning of your surgery when you wake up and every 2 hours until you get to the Short-Stay unit.  Treat a low blood sugar (less than 70 mg/dL) with 1/2 cup of clear juice (cranberry or apple), 4 glucose tablets, OR glucose gel.  Recheck blood sugar in 15 minutes after treatment (to make sure it is greater than 70 mg/dL).  If blood sugar is not greater than 70 mg/dL on  re-check, call [phone] for further instructions.   Report your blood sugar to the Short-Stay nurse when you get to Short-Stay.  References:  [facility], 2007 "How to Manage your Diabetes Before and After Surgery".   Do not wear jewelry, make-up or nail polish.  Do not wear lotions, powders, perfumes or deodorant.  Do not shave 48 hours prior to surgery.   Do not bring valuables to the hospital.  [facility] is not responsible for any belongings or valuables.  Contacts, dentures or bridgework may not be worn into surgery.  Leave your suitcase in the car.  After surgery it may be brought to your room.  For patients admitted to the hospital, discharge time will be determined by your treatment team.  Patients discharged the day of surgery will not be allowed to drive home.   [facility] - Preparing for Surgery  Before surgery, you can play an important role.  Because skin is not sterile, your skin needs to be as free of germs as possible.  You can reduce the number of germs on you skin by washing with CHG (chlorahexidine gluconate) soap before surgery.  CHG is an antiseptic cleaner which kills germs and bonds with the skin to continue killing germs even after washing.  Please DO NOT use if you have an allergy to CHG or antibacterial  soaps.  If your skin becomes reddened/irritated stop using the CHG and inform your nurse when you arrive at Short Stay.  Do not shave (including legs and underarms) for at least 48 hours prior to the first CHG shower.  You may shave your face.  Please follow these instructions carefully:   1.  Shower with CHG Soap the night before surgery and the                    morning of Surgery.  2.  If you choose to wash your hair, wash your hair first as usual with your       normal shampoo.  3.  After you shampoo, rinse your hair and body thoroughly to remove the shampoo.  4.  Use CHG as you would any other liquid soap.  You can apply  chg directly       to the skin and wash gently with scrungie or a clean washcloth.  5.  Apply the CHG Soap to your body ONLY FROM THE NECK DOWN.        Do not use on open wounds or open sores.  Avoid contact with your eyes, ears, mouth and genitals (private parts).  Wash genitals (private parts) with your normal soap.  6.  Wash thoroughly, paying special attention to the area where your surgery        will be performed.  7.  Thoroughly rinse your body with warm water from the neck down.  8.  DO NOT shower/wash with your normal soap after using and rinsing off       the CHG Soap.  9.  Pat yourself dry with a clean towel.            10.  Wear clean pajamas.            11.  Place clean sheets on your bed the night of your first shower and do not        sleep with pets.  Day of Surgery  Do not apply any lotions/deodorants the morning of surgery.  Please wear clean clothes to the hospital.   Please read over the fact sheets that you were given.

## 2017-02-18 ENCOUNTER — Ambulatory Visit (HOSPITAL_COMMUNITY)
Admission: RE | Admit: 2017-02-18 | Discharge: 2017-02-18 | Disposition: A | Discharge status: Home / Self Care | Payer: Medicare Other | Source: Ambulatory Visit | Attending: Nurse Practitioner | Admitting: Nurse Practitioner

## 2017-02-18 ENCOUNTER — Other Ambulatory Visit (HOSPITAL_COMMUNITY): Payer: Self-pay | Admitting: *Deleted

## 2017-02-18 ENCOUNTER — Encounter (HOSPITAL_COMMUNITY): Payer: Self-pay

## 2017-02-18 ENCOUNTER — Encounter (HOSPITAL_COMMUNITY)
Admission: RE | Admit: 2017-02-18 | Discharge: 2017-02-18 | Disposition: A | Discharge status: Home / Self Care | Payer: Medicare Other | Source: Ambulatory Visit | Attending: Nurse Practitioner | Admitting: Nurse Practitioner

## 2017-02-18 ENCOUNTER — Other Ambulatory Visit: Payer: Self-pay

## 2017-02-18 ENCOUNTER — Encounter (HOSPITAL_COMMUNITY)
Admission: RE | Admit: 2017-02-18 | Discharge: 2017-02-18 | Disposition: A | Discharge status: Home / Self Care | Payer: Medicare Other | Source: Ambulatory Visit | Attending: General Surgery | Admitting: General Surgery

## 2017-02-18 DIAGNOSIS — Z01810 Encounter for preprocedural cardiovascular examination: Secondary | ICD-10-CM | POA: Diagnosis not present

## 2017-02-18 DIAGNOSIS — J439 Emphysema, unspecified: Secondary | ICD-10-CM | POA: Diagnosis not present

## 2017-02-18 DIAGNOSIS — C50412 Malignant neoplasm of upper-outer quadrant of left female breast: Secondary | ICD-10-CM

## 2017-02-18 DIAGNOSIS — E119 Type 2 diabetes mellitus without complications: Secondary | ICD-10-CM | POA: Diagnosis not present

## 2017-02-18 DIAGNOSIS — Z170 Estrogen receptor positive status [ER+]: Principal | ICD-10-CM

## 2017-02-18 DIAGNOSIS — C50212 Malignant neoplasm of upper-inner quadrant of left female breast: Secondary | ICD-10-CM | POA: Diagnosis not present

## 2017-02-18 DIAGNOSIS — K7689 Other specified diseases of liver: Secondary | ICD-10-CM | POA: Diagnosis not present

## 2017-02-18 DIAGNOSIS — I723 Aneurysm of iliac artery: Secondary | ICD-10-CM | POA: Diagnosis not present

## 2017-02-18 DIAGNOSIS — I4510 Unspecified right bundle-branch block: Secondary | ICD-10-CM | POA: Diagnosis not present

## 2017-02-18 DIAGNOSIS — I10 Essential (primary) hypertension: Secondary | ICD-10-CM | POA: Diagnosis not present

## 2017-02-18 DIAGNOSIS — Z01818 Encounter for other preprocedural examination: Secondary | ICD-10-CM | POA: Diagnosis not present

## 2017-02-18 DIAGNOSIS — R918 Other nonspecific abnormal finding of lung field: Secondary | ICD-10-CM | POA: Diagnosis not present

## 2017-02-18 DIAGNOSIS — C50912 Malignant neoplasm of unspecified site of left female breast: Secondary | ICD-10-CM | POA: Diagnosis not present

## 2017-02-18 DIAGNOSIS — Z01812 Encounter for preprocedural laboratory examination: Secondary | ICD-10-CM | POA: Diagnosis not present

## 2017-02-18 HISTORY — DX: Pneumonia, unspecified organism: J18.9

## 2017-02-18 HISTORY — DX: Unspecified asthma, uncomplicated: J45.909

## 2017-02-18 LAB — CBC
HEMATOCRIT: 39.6 % (ref 36.0–46.0)
HEMOGLOBIN: 13.3 g/dL (ref 12.0–15.0)
MCH: 30.9 pg (ref 26.0–34.0)
MCHC: 33.6 g/dL (ref 30.0–36.0)
MCV: 92.1 fL (ref 78.0–100.0)
Platelets: 239 10*3/uL (ref 150–400)
RBC: 4.3 MIL/uL (ref 3.87–5.11)
RDW: 12.8 % (ref 11.5–15.5)
WBC: 4.3 10*3/uL (ref 4.0–10.5)

## 2017-02-18 LAB — GLUCOSE, CAPILLARY: GLUCOSE-CAPILLARY: 149 mg/dL — AB (ref 65–99)

## 2017-02-18 LAB — BASIC METABOLIC PANEL
ANION GAP: 12 (ref 5–15)
BUN: 7 mg/dL (ref 6–20)
CHLORIDE: 94 mmol/L — AB (ref 101–111)
CO2: 24 mmol/L (ref 22–32)
Calcium: 9.4 mg/dL (ref 8.9–10.3)
Creatinine, Ser: 0.72 mg/dL (ref 0.44–1.00)
GFR calc non Af Amer: >60 mL/min (ref >60)
GLUCOSE: 103 mg/dL — AB (ref 65–99)
Potassium: 3.5 mmol/L (ref 3.5–5.1)
Sodium: 130 mmol/L — ABNORMAL LOW (ref 135–145)

## 2017-02-18 LAB — HEMOGLOBIN A1C
Hgb A1c MFr Bld: 6.7 % — ABNORMAL HIGH (ref 4.8–5.6)
Mean Plasma Glucose: 145.59 mg/dL

## 2017-02-18 MED ORDER — IOPAMIDOL (ISOVUE-300) INJECTION 61%
INTRAVENOUS | Status: AC
  Filled 2017-02-18: qty 100

## 2017-02-18 MED ORDER — IOPAMIDOL (ISOVUE-300) INJECTION 61%
INTRAVENOUS | Status: AC
  Filled 2017-02-18: qty 30

## 2017-02-18 MED ORDER — IOPAMIDOL (ISOVUE-300) INJECTION 61%
100.0000 mL | Freq: Once | INTRAVENOUS | Status: AC | PRN
  Administered 2017-02-18: 100 mL via INTRAVENOUS

## 2017-02-18 MED ORDER — TECHNETIUM TC 99M MEDRONATE IV KIT: 25.0000 | PACK | Freq: Once | INTRAVENOUS | Status: DC | PRN

## 2017-02-18 MED ORDER — IOPAMIDOL (ISOVUE-300) INJECTION 61%
30.0000 mL | Freq: Once | INTRAVENOUS | Status: AC | PRN
  Administered 2017-02-18: 30 mL via ORAL

## 2017-02-18 MED ORDER — TECHNETIUM TC 99M MEDRONATE IV KIT: 20.4000 | PACK | Freq: Once | INTRAVENOUS | Status: DC | PRN

## 2017-02-18 NOTE — Progress Notes (Signed)
Pt sees Dr. Jacinta Shoe for non-obstructive CAD, HTN and diastolic dysfunction. She denies any chest pain or sob. Pt is a type 2 diabetic. Last A1C was 6.8 on 07/15/16. She states her fasting blood sugar is usually around 124.  Echo done yesterday - 02/17/17.

## 2017-02-22 ENCOUNTER — Telehealth: Payer: Self-pay | Admitting: Hematology

## 2017-02-22 ENCOUNTER — Ambulatory Visit
Admission: RE | Admit: 2017-02-22 | Discharge: 2017-02-22 | Disposition: A | Discharge status: Home / Self Care | Payer: Medicare Other | Source: Ambulatory Visit | Attending: General Surgery | Admitting: General Surgery

## 2017-02-22 ENCOUNTER — Other Ambulatory Visit: Payer: Self-pay | Admitting: General Surgery

## 2017-02-22 DIAGNOSIS — C50912 Malignant neoplasm of unspecified site of left female breast: Secondary | ICD-10-CM

## 2017-02-22 NOTE — Telephone Encounter (Signed)
I called her and discussed her CT and bone scan findings, no concerns for metastatic disease, will f/u her small lung and liver lesions in future. She is scheduled for surgery tomorrow, will see her back in 2-3 weeks. Pt appreciated the call.   Penny Huynh  02/22/2017

## 2017-02-22 NOTE — H&P (Signed)
History of Present Illness Penny Huynh T. Penny Bellerose Huynh; 02/12/2017 4:32 PM) The patient is a 84 year old female who presents with breast cancer. Patient comes in for further discussion today after seeing medical and radiation oncology. Chemotherapy is planned. She will need a Port-A-Cath. We again reviewed the surgery and recovery and risks and all her questions were answered. Her original presentation was as follows:  She is a postmenopausal female referred by Dr. Claudie Huynh for evaluation of recently diagnosed carcinoma of the left breast. The patient states that she has been able to feel 2 lumps in her left breast for a number of months. She initially thought they were not serious but recently she decided that this needed to be evaluated. Her last mammogram was in 2011. She has no history of any previous breast disease or family history of breast cancer. She called her primary physician and was promptly referred for diagnostic imaging.. Subsequent imaging included diagnostic mamogram showing a spiculated mass in the upper outer quadrant of the left breast and a larger lobulated mass in the medial posterior left breast. Targeted ultrasound showed an irregular hypoechoic mass at the 2:30 position of the left breast measuring 3.3 cm in maximum diameter and at the 10:30 position of the left breast an irregular mass measuring 4.8 cm in diameter. Several lymph nodes in the axilla showed thickened cortices. Subsequent ultrasound-guided core biopsy was performed of both masses and one of the abnormal lymph nodes. The lymph node returned as negative for cancer. The mass at 2:30 showed invasive mammary carcinoma E Cadherin positive consistent with ductal carcinoma and the mass at 10:30 showed invasive ductal carcinoma. Both masses are grade 3 with the 2:30 mass 100% ER and 60% PR positive with Ki-67 60% and HER-2 negative and the mass at 10:30 shows 100% estrogen positive, 50% progesterone positive and KI  70% and HER-2 positive. She has experienced lumps as noted but no nipple discharge or skin changes, pain or constitutional symptoms. She does not have a personal history of any previous breast problems. No apparent family history of breast cancer  Findings at that time were the following: Tumor size: 3.3 and 4.8 cm Tumor grade: 3 Estrogen Receptor: Positive Progesterone Receptor: Positive Her-2 neu: Negative in the 2:30 mass but positive at the 10:30 mass Lymph node status: Thickened cortices but biopsy negative    Allergies (Penny Huynh, [place]; 02/12/2017 3:42 PM) No Known Drug Allergies [02/03/2017]: Allergies Reconciled   Medication History (Penny Huynh, [place]; 02/12/2017 3:42 PM) MetFORMIN HCl (500MG Tablet, Oral) Active. Chlorthalidone (25MG Tablet, Oral) Active. Aspirin (81MG Tablet DR, Oral) Active. Vitamin B-12 (1000MCG Tablet, Oral) Active. Losartan Potassium (50MG Tablet, Oral) Active. Coenzyme Q-10 (100MG Capsule, Oral) Active. Medications Reconciled  Vitals (Penny A. Brown RMA; 02/12/2017 3:42 PM) 02/12/2017 3:41 PM Weight: 159.4 lb Height: 63in Body Surface Area: 1.76 m Body Mass Index: 28.24 kg/m  Temp.: 98.51F  Pulse: 82 (Regular)  BP: 128/86 (Sitting, Left Arm, Standard)       Physical Exam Penny Huynh T. Penny Huynh; 02/12/2017 4:32 PM) The physical exam findings are as follows: Note:General: Alert, very pleasant well-dressed African-American female appearing younger than her stated age, in no distress Skin: Warm and dry without rash or infection. HEENT: No palpable masses or thyromegaly. Sclera nonicteric. Pupils equal round and reactive. Lymph nodes: No cervical, supraclavicular, nodes palpable. Breasts: In the left breast there are 2 large palpable masses, about 4 cm in the lateral left breast but possibly some minimal overlying skin  fixation and a larger approximately 5 cm mass in the medial left breast. Neither feels  fixed. I cannot feel any axillary adenopathy. Right breast is negative to exam. Lungs: Breath sounds clear and equal. No wheezing or increased work of breathing. Cardiovascular: Regular rate and rhythm without murmer. No JVD or edema. Peripheral pulses intact. Abdomen: Nondistended. Soft and nontender. No masses palpable. No organomegaly. No palpable hernias. Extremities: No edema or joint swelling or deformity. No chronic venous stasis changes. Neurologic: Alert and fully oriented. Gait normal. No focal weakness. Psychiatric: Normal mood and affect. Thought content appropriate with normal judgement and insight    Assessment & Plan Penny Huynh T. Penny Huynh; 02/12/2017 4:33 PM) STAGE II BREAST CANCER, LEFT (E39.532) Impression: 84 year old female with a new diagnosis of cancer of the left breast breast, multicentric in upper outer and upper inner quadrant. Clinical stage 2A, ER positive, PR positive, HER-2 positive in the lateral cancer and negative in the medial cancer. I discussed with the patient and her husband present today initial surgical treatment options. We discussed options of breast conservation with lumpectomy or total mastectomy and sentinal lymph node biopsy/dissection. Options for reconstruction were discussed. She would not be a candidate for breast conservation with 2 large separate masses and I recommended total mastectomy. She has a suspicious lymph node but was biopsy negative and I discussed targeted axillary sentinel lymph node biopsy excising the clipped node as well as sentinel lymph node biopsy and possible axillary dissection if any of these were positive.. We discussed the indications and nature of the procedure, and expected recovery, in detail. Surgical risks including anesthetic complications, cardiorespiratory complications, bleeding, infection, wound healing complications, blood clots, lymphedema, local and distant recurrence and possible need for further surgery based on  the final pathology was discussed and understood. Chemotherapy, hormonal therapy and radiation therapy have been discussed. They have been provided with literature regarding the treatment of breast cancer. All questions were answered. They understand and agree to proceed and we will go ahead with scheduling. She will be referred immediately to radiation and medical oncology and physical therapy. She has seen medical oncology and chemotherapy and Herceptin as planned and we will place a Port-A-Cath at the time of her procedure. We discussed the surgery in detail including risks of bleeding or pneumothorax and long-term risks of catheter displacement and DVT and occlusion. All her questions were answered.

## 2017-02-23 ENCOUNTER — Observation Stay (HOSPITAL_COMMUNITY): Payer: Medicare Other

## 2017-02-23 ENCOUNTER — Ambulatory Visit
Admission: RE | Admit: 2017-02-23 | Discharge: 2017-02-23 | Disposition: A | Discharge status: Home / Self Care | Payer: Medicare Other | Source: Ambulatory Visit | Attending: General Surgery | Admitting: General Surgery

## 2017-02-23 ENCOUNTER — Ambulatory Visit (HOSPITAL_COMMUNITY): Payer: Medicare Other | Admitting: Certified Registered"

## 2017-02-23 ENCOUNTER — Telehealth: Payer: Self-pay | Admitting: Hematology

## 2017-02-23 ENCOUNTER — Observation Stay (HOSPITAL_COMMUNITY)
Admission: RE | Admit: 2017-02-23 | Discharge: 2017-02-24 | Disposition: A | Discharge status: Home / Self Care | Payer: Medicare Other | Source: Ambulatory Visit | Attending: General Surgery | Admitting: General Surgery

## 2017-02-23 ENCOUNTER — Ambulatory Visit (HOSPITAL_COMMUNITY)
Admission: RE | Admit: 2017-02-23 | Discharge: 2017-02-23 | Disposition: A | Discharge status: Home / Self Care | Payer: Medicare Other | Source: Ambulatory Visit | Attending: General Surgery | Admitting: General Surgery

## 2017-02-23 ENCOUNTER — Encounter (HOSPITAL_COMMUNITY): Payer: Self-pay | Admitting: General Practice

## 2017-02-23 ENCOUNTER — Ambulatory Visit (HOSPITAL_COMMUNITY): Payer: Medicare Other

## 2017-02-23 ENCOUNTER — Other Ambulatory Visit: Payer: Self-pay

## 2017-02-23 ENCOUNTER — Encounter (HOSPITAL_COMMUNITY)
Admission: RE | Disposition: A | Discharge status: Home / Self Care | Payer: Self-pay | Source: Ambulatory Visit | Attending: General Surgery

## 2017-02-23 DIAGNOSIS — Z79899 Other long term (current) drug therapy: Secondary | ICD-10-CM | POA: Insufficient documentation

## 2017-02-23 DIAGNOSIS — Z95828 Presence of other vascular implants and grafts: Secondary | ICD-10-CM

## 2017-02-23 DIAGNOSIS — Z87891 Personal history of nicotine dependence: Secondary | ICD-10-CM | POA: Insufficient documentation

## 2017-02-23 DIAGNOSIS — Z7984 Long term (current) use of oral hypoglycemic drugs: Secondary | ICD-10-CM | POA: Insufficient documentation

## 2017-02-23 DIAGNOSIS — E119 Type 2 diabetes mellitus without complications: Secondary | ICD-10-CM | POA: Insufficient documentation

## 2017-02-23 DIAGNOSIS — I10 Essential (primary) hypertension: Secondary | ICD-10-CM | POA: Insufficient documentation

## 2017-02-23 DIAGNOSIS — C50912 Malignant neoplasm of unspecified site of left female breast: Secondary | ICD-10-CM

## 2017-02-23 DIAGNOSIS — G8918 Other acute postprocedural pain: Secondary | ICD-10-CM | POA: Diagnosis not present

## 2017-02-23 DIAGNOSIS — Z170 Estrogen receptor positive status [ER+]: Secondary | ICD-10-CM | POA: Diagnosis not present

## 2017-02-23 DIAGNOSIS — C50412 Malignant neoplasm of upper-outer quadrant of left female breast: Secondary | ICD-10-CM | POA: Diagnosis not present

## 2017-02-23 DIAGNOSIS — I2510 Atherosclerotic heart disease of native coronary artery without angina pectoris: Secondary | ICD-10-CM | POA: Diagnosis not present

## 2017-02-23 DIAGNOSIS — C50212 Malignant neoplasm of upper-inner quadrant of left female breast: Secondary | ICD-10-CM | POA: Insufficient documentation

## 2017-02-23 DIAGNOSIS — Z7982 Long term (current) use of aspirin: Secondary | ICD-10-CM | POA: Diagnosis not present

## 2017-02-23 DIAGNOSIS — Z419 Encounter for procedure for purposes other than remedying health state, unspecified: Secondary | ICD-10-CM

## 2017-02-23 DIAGNOSIS — Z452 Encounter for adjustment and management of vascular access device: Secondary | ICD-10-CM | POA: Diagnosis not present

## 2017-02-23 HISTORY — PX: MASTECTOMY COMPLETE / SIMPLE W/ SENTINEL NODE BIOPSY: SUR846

## 2017-02-23 HISTORY — PX: PORTA CATH INSERTION: CATH118285

## 2017-02-23 HISTORY — PX: MASTECTOMY WITH RADIOACTIVE SEED GUIDED EXCISION AND AXILLARY SENTINEL LYMPH NODE BIOPSY: SHX6736

## 2017-02-23 HISTORY — PX: PORTACATH PLACEMENT: SHX2246

## 2017-02-23 HISTORY — DX: Type 2 diabetes mellitus without complications: E11.9

## 2017-02-23 HISTORY — DX: Malignant neoplasm of unspecified site of left female breast: C50.912

## 2017-02-23 HISTORY — DX: Unspecified osteoarthritis, unspecified site: M19.90

## 2017-02-23 LAB — GLUCOSE, CAPILLARY
GLUCOSE-CAPILLARY: 131 mg/dL — AB (ref 65–99)
GLUCOSE-CAPILLARY: 173 mg/dL — AB (ref 65–99)
GLUCOSE-CAPILLARY: 234 mg/dL — AB (ref 65–99)
Glucose-Capillary: 328 mg/dL — ABNORMAL HIGH (ref 65–99)

## 2017-02-23 SURGERY — MASTECTOMY WITH RADIOACTIVE SEED GUIDED EXCISION AND AXILLARY SENTINEL LYMPH NODE BIOPSY
Anesthesia: Regional | Site: Chest

## 2017-02-23 MED ORDER — CHLORHEXIDINE GLUCONATE CLOTH 2 % EX PADS: 6.0000 | MEDICATED_PAD | Freq: Once | CUTANEOUS | Status: DC

## 2017-02-23 MED ORDER — 0.9 % SODIUM CHLORIDE (POUR BTL) OPTIME
TOPICAL | Status: DC | PRN
  Administered 2017-02-23: 1000 mL

## 2017-02-23 MED ORDER — GABAPENTIN 300 MG PO CAPS
ORAL_CAPSULE | ORAL | Status: AC
  Filled 2017-02-23: qty 1

## 2017-02-23 MED ORDER — ONDANSETRON 4 MG PO TBDP: 4.0000 mg | ORAL_TABLET | Freq: Four times a day (QID) | ORAL | Status: DC | PRN

## 2017-02-23 MED ORDER — EPHEDRINE SULFATE-NACL 50-0.9 MG/10ML-% IV SOSY
PREFILLED_SYRINGE | INTRAVENOUS | Status: DC | PRN
  Administered 2017-02-23 (×2): 10 mg via INTRAVENOUS

## 2017-02-23 MED ORDER — ENOXAPARIN SODIUM 40 MG/0.4ML ~~LOC~~ SOLN
40.0000 mg | SUBCUTANEOUS | Status: DC
  Administered 2017-02-24: 40 mg via SUBCUTANEOUS
  Filled 2017-02-23: qty 0.4

## 2017-02-23 MED ORDER — FENTANYL CITRATE (PF) 100 MCG/2ML IJ SOLN
INTRAMUSCULAR | Status: DC | PRN
  Administered 2017-02-23 (×3): 25 ug via INTRAVENOUS
  Administered 2017-02-23: 50 ug via INTRAVENOUS
  Administered 2017-02-23: 25 ug via INTRAVENOUS
  Administered 2017-02-23: 50 ug via INTRAVENOUS

## 2017-02-23 MED ORDER — ENSURE ENLIVE PO LIQD: 237.0000 mL | Freq: Two times a day (BID) | ORAL | Status: DC

## 2017-02-23 MED ORDER — PROPOFOL 10 MG/ML IV BOLUS
INTRAVENOUS | Status: AC
Start: 2017-02-23 — End: ?
  Filled 2017-02-23: qty 20

## 2017-02-23 MED ORDER — BUPIVACAINE-EPINEPHRINE 0.5% -1:200000 IJ SOLN
INTRAMUSCULAR | Status: DC | PRN
  Administered 2017-02-23: 30 mL

## 2017-02-23 MED ORDER — MORPHINE SULFATE (PF) 4 MG/ML IV SOLN: 2.0000 mg | INTRAVENOUS | Status: DC | PRN

## 2017-02-23 MED ORDER — LATANOPROST 0.005 % OP SOLN
1.0000 [drp] | Freq: Every day | OPHTHALMIC | Status: DC
  Administered 2017-02-23: 1 [drp] via OPHTHALMIC
  Filled 2017-02-23: qty 2.5

## 2017-02-23 MED ORDER — BUPIVACAINE-EPINEPHRINE (PF) 0.5% -1:200000 IJ SOLN
INTRAMUSCULAR | Status: DC | PRN
  Administered 2017-02-23: 30 mL via PERINEURAL

## 2017-02-23 MED ORDER — HEPARIN SOD (PORK) LOCK FLUSH 100 UNIT/ML IV SOLN
INTRAVENOUS | Status: DC | PRN
  Administered 2017-02-23: 500 [IU] via INTRAVENOUS

## 2017-02-23 MED ORDER — CHLORTHALIDONE 25 MG PO TABS
25.0000 mg | ORAL_TABLET | Freq: Every day | ORAL | Status: DC
  Administered 2017-02-23 – 2017-02-24 (×2): 25 mg via ORAL
  Filled 2017-02-23 (×3): qty 1

## 2017-02-23 MED ORDER — LIDOCAINE HCL (PF) 1 % IJ SOLN
INTRAMUSCULAR | Status: AC
  Filled 2017-02-23: qty 30

## 2017-02-23 MED ORDER — CEFAZOLIN SODIUM-DEXTROSE 2-4 GM/100ML-% IV SOLN
INTRAVENOUS | Status: AC
  Filled 2017-02-23: qty 100

## 2017-02-23 MED ORDER — CELECOXIB 200 MG PO CAPS
ORAL_CAPSULE | ORAL | Status: AC
  Filled 2017-02-23: qty 1

## 2017-02-23 MED ORDER — SODIUM BICARBONATE 4 % IV SOLN
INTRAVENOUS | Status: AC
  Filled 2017-02-23: qty 5

## 2017-02-23 MED ORDER — TECHNETIUM TC 99M SULFUR COLLOID FILTERED
1.0000 | Freq: Once | INTRAVENOUS | Status: AC | PRN
  Administered 2017-02-23: 1 via INTRADERMAL

## 2017-02-23 MED ORDER — ONDANSETRON HCL 4 MG/2ML IJ SOLN
INTRAMUSCULAR | Status: DC | PRN
  Administered 2017-02-23: 4 mg via INTRAVENOUS

## 2017-02-23 MED ORDER — PROPOFOL 10 MG/ML IV BOLUS
INTRAVENOUS | Status: DC | PRN
  Administered 2017-02-23: 150 mg via INTRAVENOUS
  Administered 2017-02-23: 40 mg via INTRAVENOUS

## 2017-02-23 MED ORDER — LIDOCAINE HCL (PF) 1 % IJ SOLN
INTRAMUSCULAR | Status: DC | PRN
  Administered 2017-02-23: 30 mL via INTRADERMAL

## 2017-02-23 MED ORDER — BUPIVACAINE-EPINEPHRINE (PF) 0.5% -1:200000 IJ SOLN
INTRAMUSCULAR | Status: AC
  Filled 2017-02-23: qty 30

## 2017-02-23 MED ORDER — SODIUM BICARBONATE 4 % IV SOLN
INTRAVENOUS | Status: DC | PRN
  Administered 2017-02-23: 5 mL via INTRAVENOUS

## 2017-02-23 MED ORDER — POTASSIUM CHLORIDE IN NACL 20-0.9 MEQ/L-% IV SOLN
INTRAVENOUS | Status: DC
  Administered 2017-02-23 – 2017-02-24 (×2): via INTRAVENOUS
  Filled 2017-02-23 (×2): qty 1000

## 2017-02-23 MED ORDER — CELECOXIB 200 MG PO CAPS
200.0000 mg | ORAL_CAPSULE | ORAL | Status: AC
  Administered 2017-02-23: 200 mg via ORAL

## 2017-02-23 MED ORDER — FENTANYL CITRATE (PF) 100 MCG/2ML IJ SOLN: 25.0000 ug | INTRAMUSCULAR | Status: DC | PRN

## 2017-02-23 MED ORDER — LOSARTAN POTASSIUM 50 MG PO TABS
50.0000 mg | ORAL_TABLET | Freq: Every day | ORAL | Status: DC
  Administered 2017-02-23 – 2017-02-24 (×2): 50 mg via ORAL
  Filled 2017-02-23 (×2): qty 1

## 2017-02-23 MED ORDER — CEFAZOLIN SODIUM-DEXTROSE 2-4 GM/100ML-% IV SOLN
2.0000 g | INTRAVENOUS | Status: AC
  Administered 2017-02-23: 2 g via INTRAVENOUS

## 2017-02-23 MED ORDER — OXYCODONE HCL 5 MG PO TABS: 5.0000 mg | ORAL_TABLET | ORAL | Status: DC | PRN

## 2017-02-23 MED ORDER — HEPARIN SOD (PORK) LOCK FLUSH 100 UNIT/ML IV SOLN
INTRAVENOUS | Status: AC
  Filled 2017-02-23: qty 5

## 2017-02-23 MED ORDER — METHYLENE BLUE 0.5 % INJ SOLN
INTRAVENOUS | Status: AC
  Filled 2017-02-23: qty 10

## 2017-02-23 MED ORDER — OXYCODONE HCL 5 MG/5ML PO SOLN: 5.0000 mg | Freq: Once | ORAL | Status: DC | PRN

## 2017-02-23 MED ORDER — LACTATED RINGERS IV SOLN
INTRAVENOUS | Status: DC | PRN
  Administered 2017-02-23 (×2): via INTRAVENOUS

## 2017-02-23 MED ORDER — TRAMADOL HCL 50 MG PO TABS: 50.0000 mg | ORAL_TABLET | Freq: Four times a day (QID) | ORAL | Status: DC | PRN

## 2017-02-23 MED ORDER — LIDOCAINE 2% (20 MG/ML) 5 ML SYRINGE
INTRAMUSCULAR | Status: DC | PRN
  Administered 2017-02-23: 80 mg via INTRAVENOUS

## 2017-02-23 MED ORDER — ACETAMINOPHEN 500 MG PO TABS
ORAL_TABLET | ORAL | Status: AC
  Filled 2017-02-23: qty 2

## 2017-02-23 MED ORDER — ACETAMINOPHEN 500 MG PO TABS
1000.0000 mg | ORAL_TABLET | ORAL | Status: AC
  Administered 2017-02-23: 1000 mg via ORAL

## 2017-02-23 MED ORDER — OXYCODONE HCL 5 MG PO TABS: 5.0000 mg | ORAL_TABLET | Freq: Once | ORAL | Status: DC | PRN

## 2017-02-23 MED ORDER — FENTANYL CITRATE (PF) 250 MCG/5ML IJ SOLN
INTRAMUSCULAR | Status: AC
  Filled 2017-02-23: qty 5

## 2017-02-23 MED ORDER — INSULIN ASPART 100 UNIT/ML ~~LOC~~ SOLN
0.0000 [IU] | Freq: Three times a day (TID) | SUBCUTANEOUS | Status: DC
  Administered 2017-02-23: 11 [IU] via SUBCUTANEOUS

## 2017-02-23 MED ORDER — GABAPENTIN 300 MG PO CAPS
300.0000 mg | ORAL_CAPSULE | ORAL | Status: AC
  Administered 2017-02-23: 300 mg via ORAL

## 2017-02-23 MED ORDER — SODIUM CHLORIDE 0.9 % IV SOLN
INTRAVENOUS | Status: DC | PRN
  Administered 2017-02-23: 07:00:00

## 2017-02-23 MED ORDER — ONDANSETRON HCL 4 MG/2ML IJ SOLN: 4.0000 mg | Freq: Four times a day (QID) | INTRAMUSCULAR | Status: DC | PRN

## 2017-02-23 MED ORDER — SODIUM CHLORIDE 0.9 % IJ SOLN
INTRAMUSCULAR | Status: AC
  Filled 2017-02-23: qty 10

## 2017-02-23 MED ORDER — DEXAMETHASONE SODIUM PHOSPHATE 10 MG/ML IJ SOLN
INTRAMUSCULAR | Status: DC | PRN
  Administered 2017-02-23: 4 mg via INTRAVENOUS

## 2017-02-23 MED ORDER — PHENYLEPHRINE HCL 10 MG/ML IJ SOLN
INTRAVENOUS | Status: DC | PRN
  Administered 2017-02-23: 40 ug/min via INTRAVENOUS

## 2017-02-23 SURGICAL SUPPLY — 93 items
APPLIER CLIP 9.375 MED OPEN (MISCELLANEOUS)
BAG DECANTER FOR FLEXI CONT (MISCELLANEOUS) ×4 IMPLANT
BINDER BREAST LRG (GAUZE/BANDAGES/DRESSINGS) ×4 IMPLANT
BINDER BREAST XLRG (GAUZE/BANDAGES/DRESSINGS) IMPLANT
BIOPATCH RED 1 DISK 7.0 (GAUZE/BANDAGES/DRESSINGS) ×3 IMPLANT
BIOPATCH RED 1IN DISK 7.0MM (GAUZE/BANDAGES/DRESSINGS) ×1
BLADE SURG 15 STRL LF DISP TIS (BLADE) ×4 IMPLANT
BLADE SURG 15 STRL SS (BLADE) ×4
CANISTER SUCT 3000ML PPV (MISCELLANEOUS) ×8 IMPLANT
CHLORAPREP W/TINT 10.5 ML (MISCELLANEOUS) ×4 IMPLANT
CHLORAPREP W/TINT 26ML (MISCELLANEOUS) ×4 IMPLANT
CLIP APPLIE 9.375 MED OPEN (MISCELLANEOUS) IMPLANT
CLIP VESOCCLUDE MED 6/CT (CLIP) ×4 IMPLANT
CONT SPEC 4OZ CLIKSEAL STRL BL (MISCELLANEOUS) ×4 IMPLANT
COVER MAYO STAND STRL (DRAPES) ×4 IMPLANT
COVER PROBE W GEL 5X96 (DRAPES) ×4 IMPLANT
COVER SURGICAL LIGHT HANDLE (MISCELLANEOUS) ×4 IMPLANT
COVER TRANSDUCER ULTRASND GEL (DRAPE) ×4 IMPLANT
CRADLE DONUT ADULT HEAD (MISCELLANEOUS) ×4 IMPLANT
DECANTER SPIKE VIAL GLASS SM (MISCELLANEOUS) IMPLANT
DERMABOND ADVANCED (GAUZE/BANDAGES/DRESSINGS) ×4
DERMABOND ADVANCED .7 DNX12 (GAUZE/BANDAGES/DRESSINGS) ×4 IMPLANT
DEVICE DISSECT PLASMABLAD 3.0S (MISCELLANEOUS) ×2 IMPLANT
DEVICE DUBIN SPECIMEN MAMMOGRA (MISCELLANEOUS) IMPLANT
DRAIN CHANNEL 19F RND (DRAIN) ×4 IMPLANT
DRAPE C-ARM 42X72 X-RAY (DRAPES) ×4 IMPLANT
DRAPE CHEST BREAST 15X10 FENES (DRAPES) ×4 IMPLANT
DRAPE LAPAROTOMY 100X72 PEDS (DRAPES) ×4 IMPLANT
DRAPE SURG 17X23 STRL (DRAPES) ×16 IMPLANT
DRAPE UTILITY XL STRL (DRAPES) ×4 IMPLANT
DRSG TEGADERM 2-3/8X2-3/4 SM (GAUZE/BANDAGES/DRESSINGS) ×4 IMPLANT
ELECT CAUTERY BLADE 6.4 (BLADE) ×4 IMPLANT
ELECT COATED BLADE 2.86 ST (ELECTRODE) ×4 IMPLANT
ELECT REM PT RETURN 9FT ADLT (ELECTROSURGICAL) ×4
ELECTRODE REM PT RTRN 9FT ADLT (ELECTROSURGICAL) ×2 IMPLANT
EVACUATOR SILICONE 100CC (DRAIN) ×4 IMPLANT
GAUZE SPONGE 4X4 12PLY STRL LF (GAUZE/BANDAGES/DRESSINGS) ×4 IMPLANT
GAUZE SPONGE 4X4 16PLY XRAY LF (GAUZE/BANDAGES/DRESSINGS) ×4 IMPLANT
GEL ULTRASOUND 20GR AQUASONIC (MISCELLANEOUS) ×4 IMPLANT
GLOVE BIOGEL PI IND STRL 7.0 (GLOVE) ×4 IMPLANT
GLOVE BIOGEL PI IND STRL 7.5 (GLOVE) ×4 IMPLANT
GLOVE BIOGEL PI IND STRL 8 (GLOVE) ×6 IMPLANT
GLOVE BIOGEL PI INDICATOR 7.0 (GLOVE) ×4
GLOVE BIOGEL PI INDICATOR 7.5 (GLOVE) ×4
GLOVE BIOGEL PI INDICATOR 8 (GLOVE) ×6
GLOVE ECLIPSE 7.5 STRL STRAW (GLOVE) ×24 IMPLANT
GLOVE ECLIPSE 8.0 STRL XLNG CF (GLOVE) ×4 IMPLANT
GLOVE SURG SS PI 7.0 STRL IVOR (GLOVE) ×8 IMPLANT
GOWN STRL REUS W/ TWL LRG LVL3 (GOWN DISPOSABLE) ×6 IMPLANT
GOWN STRL REUS W/ TWL XL LVL3 (GOWN DISPOSABLE) ×4 IMPLANT
GOWN STRL REUS W/TWL LRG LVL3 (GOWN DISPOSABLE) ×6
GOWN STRL REUS W/TWL XL LVL3 (GOWN DISPOSABLE) ×4
ILLUMINATOR WAVEGUIDE N/F (MISCELLANEOUS) IMPLANT
INTRODUCER COOK 11FR (CATHETERS) IMPLANT
IV CATH 14GX2 1/4 (CATHETERS) IMPLANT
KIT BASIN OR (CUSTOM PROCEDURE TRAY) ×4 IMPLANT
KIT MARKER MARGIN INK (KITS) IMPLANT
KIT PORT POWER 8FR ISP CVUE (Miscellaneous) ×4 IMPLANT
KIT ROOM TURNOVER OR (KITS) ×4 IMPLANT
NDL SAFETY ECLIPSE 18X1.5 (NEEDLE) IMPLANT
NEEDLE 22X1 1/2 (OR ONLY) (NEEDLE) ×4 IMPLANT
NEEDLE FILTER BLUNT 18X 1/2SAF (NEEDLE)
NEEDLE FILTER BLUNT 18X1 1/2 (NEEDLE) IMPLANT
NEEDLE HYPO 18GX1.5 SHARP (NEEDLE)
NEEDLE HYPO 25GX1X1/2 BEV (NEEDLE) ×4 IMPLANT
NS IRRIG 1000ML POUR BTL (IV SOLUTION) ×4 IMPLANT
PACK SURGICAL SETUP 50X90 (CUSTOM PROCEDURE TRAY) ×4 IMPLANT
PAD ABD 8X10 STRL (GAUZE/BANDAGES/DRESSINGS) ×4 IMPLANT
PAD ARMBOARD 7.5X6 YLW CONV (MISCELLANEOUS) ×4 IMPLANT
PENCIL BUTTON HOLSTER BLD 10FT (ELECTRODE) ×4 IMPLANT
PLASMABLADE 3.0S (MISCELLANEOUS) ×4
SET INTRODUCER 12FR PACEMAKER (SHEATH) IMPLANT
SET SHEATH INTRODUCER 10FR (MISCELLANEOUS) IMPLANT
SHEATH COOK PEEL AWAY SET 9F (SHEATH) IMPLANT
SPONGE LAP 18X18 X RAY DECT (DISPOSABLE) ×8 IMPLANT
STAPLER VISISTAT 35W (STAPLE) ×4 IMPLANT
SUT ETHILON 2 0 FS 18 (SUTURE) ×4 IMPLANT
SUT MON AB 5-0 PS2 18 (SUTURE) ×12 IMPLANT
SUT PROLENE 2 0 SH DA (SUTURE) ×4 IMPLANT
SUT SILK 2 0 (SUTURE)
SUT SILK 2-0 18XBRD TIE 12 (SUTURE) IMPLANT
SUT VIC AB 3-0 54X BRD REEL (SUTURE) ×2 IMPLANT
SUT VIC AB 3-0 BRD 54 (SUTURE) ×2
SUT VIC AB 3-0 SH 18 (SUTURE) ×12 IMPLANT
SYR 20ML ECCENTRIC (SYRINGE) ×8 IMPLANT
SYR 5ML LUER SLIP (SYRINGE) ×4 IMPLANT
SYR BULB 3OZ (MISCELLANEOUS) ×4 IMPLANT
SYR CONTROL 10ML LL (SYRINGE) ×4 IMPLANT
TOWEL OR 17X24 6PK STRL BLUE (TOWEL DISPOSABLE) ×4 IMPLANT
TOWEL OR 17X26 10 PK STRL BLUE (TOWEL DISPOSABLE) ×4 IMPLANT
TUBE CONNECTING 12'X1/4 (SUCTIONS) ×2
TUBE CONNECTING 12X1/4 (SUCTIONS) ×6 IMPLANT
YANKAUER SUCT BULB TIP NO VENT (SUCTIONS) ×4 IMPLANT

## 2017-02-23 NOTE — Transfer of Care (Signed)
Immediate Anesthesia Transfer of Care Note  Patient: Penny Huynh  Procedure(s) Performed: LEFT TOTAL MATECTOMY;  LEFT AXILLARY SENTINEL LYMPH NODE BIOPSY ERAS PATHWAY (Left Breast) INSERTION PORT-A-CATH (N/A Chest)  Patient Location: PACU  Anesthesia Type:General  Level of Consciousness: awake, alert  and oriented  Airway & Oxygen Therapy: Patient Spontanous Breathing and Patient connected to nasal cannula oxygen  Post-op Assessment: Report given to RN and Post -op Vital signs reviewed and stable  Post vital signs: Reviewed and stable  Last Vitals:  Vitals:   02/23/17 0552  BP: (!) 194/63  Pulse: 68  Resp: 18  Temp: 36.9 C  SpO2: 100%    Last Pain:  Vitals:   02/23/17 0552  TempSrc: Oral      Patients Stated Pain Goal: 3 (13/08/65 7846)  Complications: No apparent anesthesia complications

## 2017-02-23 NOTE — Anesthesia Procedure Notes (Signed)
Procedure Name: LMA Insertion Date/Time: 02/23/2017 7:46 AM Performed by: Imagene Riches, CRNA Pre-anesthesia Checklist: Patient identified, Emergency Drugs available, Suction available and Patient being monitored Patient Re-evaluated:Patient Re-evaluated prior to induction Oxygen Delivery Method: Circle System Utilized Preoxygenation: Pre-oxygenation with 100% oxygen Induction Type: IV induction Ventilation: Mask ventilation without difficulty LMA: LMA inserted LMA Size: 4.0 Number of attempts: 1 Airway Equipment and Method: Bite block Placement Confirmation: positive ETCO2 Tube secured with: Tape Dental Injury: Teeth and Oropharynx as per pre-operative assessment

## 2017-02-23 NOTE — Interval H&P Note (Signed)
History and Physical Interval Note:  02/23/2017 7:18 AM  Penny Huynh  has presented today for surgery, with the diagnosis of cancer left breat  The various methods of treatment have been discussed with the patient and family. After consideration of risks, benefits and other options for treatment, the patient has consented to  Procedure(s): LEFT TOTAL MATECTOMY WITH RADIOACTIVE SEED TARGETED LEFT AXILLARY SENTINEL [place] (Left) INSERTION PORT-A-CATH (N/A) as a surgical intervention .  The patient's history has been reviewed, patient examined, no change in status, stable for surgery.  I have reviewed the patient's chart and labs.  Questions were answered to the patient's satisfaction.     Darene Lamer Ephram Kornegay

## 2017-02-23 NOTE — Anesthesia Procedure Notes (Signed)
Anesthesia Regional Block: Pectoralis block   Pre-Anesthetic Checklist: ,, timeout performed, Correct Patient, Correct Site, Correct Laterality, Correct Procedure, Correct Position, site marked, Risks and benefits discussed,  Surgical consent,  Pre-op evaluation,  At surgeon's request and post-op pain management  Laterality: Left  Prep: chloraprep       Needles:  Injection technique: Single-shot  Needle Type: Echogenic Needle     Needle Length: 9cm  Needle Gauge: 21     Additional Needles:   Procedures:,,,, ultrasound used (permanent image in chart),,,,  Narrative:  Start time: 02/23/2017 7:15 AM End time: 02/23/2017 7:20 AM Injection made incrementally with aspirations every 5 mL.  Performed by: Personally  Anesthesiologist: Catalina Gravel, MD  Additional Notes: No pain on injection. No increased resistance to injection. Injection made in 5cc increments.  Good needle visualization.  Patient tolerated procedure well.

## 2017-02-23 NOTE — Anesthesia Preprocedure Evaluation (Signed)
Anesthesia Evaluation  Patient identified by MRN, date of birth, ID band Patient awake    Reviewed: Allergy & Precautions, NPO status , Patient's Chart, lab work & pertinent test results  History of Anesthesia Complications Negative for: history of anesthetic complications  Airway Mallampati: II  TM Distance: >3 FB Neck ROM: Full    Dental  (+) Teeth Intact   Pulmonary shortness of breath, asthma , former smoker,    breath sounds clear to auscultation       Cardiovascular hypertension, Pt. on medications (-) angina+ CAD   Rhythm:Regular     Neuro/Psych negative neurological ROS  negative psych ROS   GI/Hepatic negative GI ROS, Neg liver ROS,   Endo/Other  diabetes, Type 2  Renal/GU negative Renal ROS     Musculoskeletal   Abdominal   Peds  Hematology negative hematology ROS (+)   Anesthesia Other Findings   Reproductive/Obstetrics                             Anesthesia Physical Anesthesia Plan  ASA: II  Anesthesia Plan: General   Post-op Pain Management:  Regional for Post-op pain   Induction: Intravenous  PONV Risk Score and Plan: 3 and Ondansetron and Dexamethasone  Airway Management Planned: LMA and Oral ETT  Additional Equipment: None  Intra-op Plan:   Post-operative Plan: Extubation in OR  Informed Consent: I have reviewed the patients History and Physical, chart, labs and discussed the procedure including the risks, benefits and alternatives for the proposed anesthesia with the patient or authorized representative who has indicated his/her understanding and acceptance.   Dental advisory given  Plan Discussed with: CRNA and Surgeon  Anesthesia Plan Comments:         Anesthesia Quick Evaluation

## 2017-02-23 NOTE — Op Note (Signed)
Preoperative Diagnosis: cancer left breat  Postoprative Diagnosis: cancer left breat  Procedure: Procedure(s): LEFT TOTAL MATECTOMY;  LEFT AXILLARY SENTINEL LYMPH NODE BIOPSY ERAS PATHWAY INSERTION PORT-A-CATH with ultrasound guidance   Surgeon: Excell Seltzer T   Assistants: RNFA  Anesthesia:  General LMA anesthesia  Indications: Patient is a 84 year old female with locally advanced node-negative stage II invasive ductal cancer of the left breast, HER-2 positive.  After extensive preoperative workup and discussion detailed elsewhere we have elected to proceed with left total mastectomy and axillary sentinel lymph node biopsy as initial surgical treatment and will place a Port-A-Cath for postoperative therapy.    Procedure Detail: In the holding area the patient underwent injection of 1 mCi of technetium sulfur colloid intradermally around the left nipple.  She was brought to the operating room, placed in the supine position on the operating table, and laryngeal mask general anesthesia induced.  She received preoperative IV antibiotics.  PAS were in place.  The entire anterior chest and neck and left upper arm were widely sterilely prepped and draped with the right arm tucked and the left arm gently extended.  Patient timeout was performed and correct procedures verified.  The mastectomy was approached initially.  I used a transversely oriented elliptical incision encompassing the nipple areolar complex and also the skin over the 2 large palpable breast cancers.  Skin and subcu changes flaps were raised with the plasma blade superiorly to the clavicle, medially to the edge of the sternum, inferiorly to the inframammary crease and laterally out to the anterior border of the latissimus dorsi which was dissected.  The dissection was carried down to the chest wall.  The breast was then reflected up off the chest wall working medial to lateral with the plasma blade.  It was freed from the lateral  edge of the pectoralis and from the serratus.  It was dissected anteriorly off of the latissimus working up to the axilla.  The clavipectoral fascia was incised and the axillary contents exposed.  Using the neoprobe for guidance I was able to localize for lymph nodes with elevated counts none of which were obviously pathologic.  At this point there was essentially no elevated count in the axilla and no palpable adenopathy.  I came across the low axilla with Kelly clamps and these were tied with 3-0 Vicryl and the specimen removed.  He was oriented and sent for permanent pathology.  The wound was thoroughly irrigated and complete hemostasis assured.  A 19 Blake closed suction drain was left through a separate stab wound under both flaps.  The simultaneous tissue was closed with interrupted 3-0 Vicryl and the skin with running some particular 5-0 Monocryl and Dermabond. Gloves and gowns and instruments were changed.  Attention was turned to the Port-A-Cath.  The ultrasound was used to localize the right internal jugular vein with the patient in Trendelenburg.  Through a small stab incision the right internal jugular vein was cannulated with a needle and guidewire under continuous ultrasound visualization.  The introducer was placed over the guidewire and the flushed catheter placed via the introducer which was stripped away and the tip of the catheter positioned in the superior vena cava.  A small transverse incision was made on the anterior chest and subcutaneous pocket created.  The catheter was tunneled subcutaneously to the pocket, trimmed to length and attached to the port which was sutured to the anterior chest wall with interrupted 2-0 Prolene.  Fluoroscopy showed the tip of the catheter in the  SVC under good position.  Subtenons tissue was closed with interrupted 5-0 Monocryl and skin with subcuticular 5-0 Monocryl and Dermabond.  The catheter flushed and aspirated easily and was left flushed with  concentrated heparin solution.  Sterile dressings were applied.  Sponge needle and instrument counts were correct.   Findings: As above  Estimated Blood Loss:  less than 100 mL         Drains: 19 round Blake  Blood Given: none          Specimens: #1 left total mastectomy #2 left axillary sentinel lymph nodes X 4        Complications:  * No complications entered in OR log *         Disposition: PACU - hemodynamically stable.         Condition: stable

## 2017-02-23 NOTE — Telephone Encounter (Signed)
Left message for patient regarding upcoming February appointments per 1/21 sch message °

## 2017-02-24 ENCOUNTER — Encounter (HOSPITAL_COMMUNITY): Payer: Self-pay | Admitting: General Surgery

## 2017-02-24 DIAGNOSIS — C50212 Malignant neoplasm of upper-inner quadrant of left female breast: Secondary | ICD-10-CM | POA: Diagnosis not present

## 2017-02-24 DIAGNOSIS — C50412 Malignant neoplasm of upper-outer quadrant of left female breast: Secondary | ICD-10-CM | POA: Diagnosis not present

## 2017-02-24 DIAGNOSIS — Z170 Estrogen receptor positive status [ER+]: Secondary | ICD-10-CM | POA: Diagnosis not present

## 2017-02-24 DIAGNOSIS — Z79899 Other long term (current) drug therapy: Secondary | ICD-10-CM | POA: Diagnosis not present

## 2017-02-24 DIAGNOSIS — Z7982 Long term (current) use of aspirin: Secondary | ICD-10-CM | POA: Diagnosis not present

## 2017-02-24 DIAGNOSIS — Z7984 Long term (current) use of oral hypoglycemic drugs: Secondary | ICD-10-CM | POA: Diagnosis not present

## 2017-02-24 LAB — HEMOGLOBIN AND HEMATOCRIT, BLOOD
HCT: 31.1 % — ABNORMAL LOW (ref 36.0–46.0)
HEMOGLOBIN: 10.3 g/dL — AB (ref 12.0–15.0)

## 2017-02-24 LAB — CBC
HCT: 20.9 % — ABNORMAL LOW (ref 36.0–46.0)
Hemoglobin: 6.7 g/dL — CL (ref 12.0–15.0)
MCH: 29.1 pg (ref 26.0–34.0)
MCHC: 32.1 g/dL (ref 30.0–36.0)
MCV: 90.9 fL (ref 78.0–100.0)
Platelets: 133 10*3/uL — ABNORMAL LOW (ref 150–400)
RBC: 2.3 MIL/uL — ABNORMAL LOW (ref 3.87–5.11)
RDW: 12.7 % (ref 11.5–15.5)
WBC: 4.7 10*3/uL (ref 4.0–10.5)

## 2017-02-24 LAB — BASIC METABOLIC PANEL
ANION GAP: 11 (ref 5–15)
BUN: 6 mg/dL (ref 6–20)
CALCIUM: 8.6 mg/dL — AB (ref 8.9–10.3)
CHLORIDE: 97 mmol/L — AB (ref 101–111)
CO2: 24 mmol/L (ref 22–32)
Creatinine, Ser: 0.8 mg/dL (ref 0.44–1.00)
GFR calc non Af Amer: >60 mL/min (ref >60)
GLUCOSE: 203 mg/dL — AB (ref 65–99)
Potassium: 3.7 mmol/L (ref 3.5–5.1)
Sodium: 132 mmol/L — ABNORMAL LOW (ref 135–145)

## 2017-02-24 LAB — GLUCOSE, CAPILLARY: GLUCOSE-CAPILLARY: 98 mg/dL (ref 65–99)

## 2017-02-24 MED ORDER — TRAMADOL HCL 50 MG PO TABS: 50.0000 mg | ORAL_TABLET | Freq: Four times a day (QID) | ORAL | 0 refills | Status: DC | PRN

## 2017-02-24 NOTE — Progress Notes (Signed)
CRITICAL VALUE ALERT  Critical Value:  Hbg 6.7  Date & Time Notied:  02/24/17, 3086    Provider Notified: [name]   Orders Received/Actions taken: yes*

## 2017-02-24 NOTE — Progress Notes (Signed)
Patient ID: Penny Huynh, female   DOB: 10-10-1933, 84 y.o.   MRN: [id number] 1 Day Post-Op   Subjective: Doing well this morning.  Denies pain complaints.  Objective: Vital signs in last 24 hours: Temp:  [97.6 F (36.4 C)-98.9 F (37.2 C)] 98.4 F (36.9 C) (01/23 0509) Pulse Rate:  [69-93] 75 (01/23 0509) Resp:  [9-20] 18 (01/23 0509) BP: (124-156)/(59-96) 148/59 (01/23 0509) SpO2:  [98 %-100 %] 99 % (01/23 0509)    Intake/Output from previous day: 01/22 0701 - 01/23 0700 In: 1340 [P.O.:440; I.V.:900] Out: 364 [Drains:264; Blood:100] Intake/Output this shift: No intake/output data recorded.  General appearance: alert, cooperative and no distress Incision/Wound: Clean and dry.  No bleeding or swelling.  JP drainage serosanguineous.  Lab Results:  No results for input(s): WBC, HGB, HCT, PLT in the last 72 hours. BMET No results for input(s): NA, K, CL, CO2, GLUCOSE, BUN, CREATININE, CALCIUM in the last 72 hours.   Studies/Results: Nm Sentinel Node Inj-no Rpt (breast)  Result Date: 02/23/2017 Sulfur colloid was injected by the nuclear medicine technologist for melanoma sentinel node.   Dg Chest Port 1 View  Result Date: 02/23/2017 CLINICAL DATA:  Status post Port-A-Cath placement EXAM: PORTABLE CHEST 1 VIEW COMPARISON:  02/18/2017 FINDINGS: Cardiac shadow is mildly enlarged. Surgical drain is noted over the left chest consistent with the recent mastectomy. Right chest wall port is noted with catheter tip at the cavoatrial junction. No pneumothorax is seen. The overall inspiratory effort is poor. No bony abnormality is seen. IMPRESSION: No pneumothorax following port placement. Electronically Signed   By: Inez Catalina M.D.   On: 02/23/2017 11:00   Dg Fluoro Guide Cv Line-no Report  Result Date: 02/23/2017 Fluoroscopy was utilized by the requesting physician.  No radiographic interpretation.    Anti-infectives: Anti-infectives (From admission, onward)   Start      Dose/Rate Route Frequency Ordered Stop   02/23/17 0628  ceFAZolin (ANCEF) 2-4 GM/100ML-% IVPB    Comments:  Leandrew Koyanagi   : cabinet override      02/23/17 0628 02/23/17 0749   02/23/17 0625  ceFAZolin (ANCEF) IVPB 2g/100 mL premix     2 g 200 mL/hr over 30 Minutes Intravenous On call to O.R. 02/23/17 0625 02/23/17 0749      Assessment/Plan: s/p Procedure(s): LEFT TOTAL MATECTOMY;  LEFT AXILLARY SENTINEL LYMPH NODE BIOPSY ERAS PATHWAY INSERTION PORT-A-CATH Doing well postoperatively without complication.  Okay for discharge.    LOS: 0 days    Edward Jolly 02/24/2017

## 2017-02-24 NOTE — Discharge Summary (Signed)
  Patient ID: Penny Huynh [id number] 83 y.o. [birth year]  02/23/2017  Discharge date and time: 02/24/2017   Admitting Physician: Edward Jolly  Discharge Physician: Edward Jolly  Admission Diagnoses: cancer left breat  Discharge Diagnoses: Same  Operations: Procedure(s): LEFT TOTAL MATECTOMY;  LEFT AXILLARY SENTINEL LYMPH NODE BIOPSY ERAS PATHWAY INSERTION PORT-A-CATH  Admission Condition: good  Discharged Condition: good  Indication for Admission: 84 year old female with a new diagnosis of cancer of the left breast breast, multicentric in upper outer and upper inner quadrant. Clinical stage 2A, ER positive, PR positive, HER-2 positive in the lateral cancer and negative in the medial cancer.  She is admitted for left total mastectomy with axillary sentinel lymph node biopsy and Port-A-Cath placement.   Hospital Course: Patient left total mastectomy with axillary sentinel lymph node sampling and placement of Port-A-Cath.  Following morning she is very comfortable.  Vital signs stable.  Wound without bleeding or swelling or other complicating factors.  She is felt ready for discharge.  Disposition: Home  Patient Instructions:  Allergies as of 02/24/2017   No Known Allergies     Medication List    TAKE these medications   aspirin 81 MG EC tablet Take 81 mg by mouth at bedtime.   chlorthalidone 25 MG tablet Commonly known as:  HYGROTON Take 25 mg by mouth daily.   CoQ10 100 MG Caps Take 100 mg by mouth daily.   latanoprost 0.005 % ophthalmic solution Commonly known as:  XALATAN Place 1 drop into both eyes at bedtime.   losartan 50 MG tablet Commonly known as:  COZAAR Take 50 mg by mouth daily.   metFORMIN 500 MG tablet Commonly known as:  GLUCOPHAGE Take 1,000 mg by mouth daily with breakfast.   traMADol 50 MG tablet Commonly known as:  ULTRAM Take 1 tablet (50 mg total) by mouth every 6 (six) hours as needed (mild pain).   vitamin B-12 1000  MCG tablet Commonly known as:  CYANOCOBALAMIN Take 1,000 mcg by mouth daily.       Activity: activity as tolerated Diet: diabetic diet Wound Care: Instructed in emptying JP drain twice daily.  May shower for 72 hours.  Follow-up:  With Jmichael Gille in 1 week.  Signed: Edward Jolly MD, FACS  02/24/2017, 7:21 AM

## 2017-02-24 NOTE — Anesthesia Postprocedure Evaluation (Signed)
Anesthesia Post Note  Patient: Penny Huynh  Procedure(s) Performed: LEFT TOTAL MATECTOMY;  LEFT AXILLARY SENTINEL LYMPH NODE BIOPSY ERAS PATHWAY (Left Breast) INSERTION PORT-A-CATH (N/A Chest)     Patient location during evaluation: PACU Anesthesia Type: General and Regional Level of consciousness: awake and alert Pain management: pain level controlled Vital Signs Assessment: post-procedure vital signs reviewed and stable Respiratory status: spontaneous breathing, nonlabored ventilation, respiratory function stable and patient connected to nasal cannula oxygen Cardiovascular status: blood pressure returned to baseline and stable Postop Assessment: no apparent nausea or vomiting Anesthetic complications: no    Last Vitals:  Vitals:   02/23/17 2118 02/24/17 0509  BP: (!) 141/61 (!) 148/59  Pulse: 88 75  Resp: 18 18  Temp: 37.2 C 36.9 C  SpO2: 99% 99%    Last Pain:  Vitals:   02/24/17 0509  TempSrc: Oral  PainSc:                  [name]

## 2017-02-24 NOTE — Plan of Care (Signed)
  Progressing Education: Knowledge of General Education information will improve 02/24/2017 0215 - Progressing by Fransisco Hertz, RN Health Behavior/Discharge Planning: Ability to manage health-related needs will improve 02/24/2017 0215 - Progressing by Fransisco Hertz, RN Clinical Measurements: Ability to maintain clinical measurements within normal limits will improve 02/24/2017 0215 - Progressing by Fransisco Hertz, RN Will remain free from infection 02/24/2017 0215 - Progressing by Fransisco Hertz, RN Diagnostic test results will improve 02/24/2017 0215 - Progressing by Fransisco Hertz, RN Respiratory complications will improve 02/24/2017 0215 - Progressing by Fransisco Hertz, RN Cardiovascular complication will be avoided 02/24/2017 0215 - Progressing by Fransisco Hertz, RN Activity: Risk for activity intolerance will decrease 02/24/2017 0215 - Progressing by Fransisco Hertz, RN Nutrition: Adequate nutrition will be maintained 02/24/2017 0215 - Progressing by Fransisco Hertz, RN Coping: Level of anxiety will decrease 02/24/2017 0215 - Progressing by Fransisco Hertz, RN Elimination: Will not experience complications related to bowel motility 02/24/2017 0215 - Progressing by Fransisco Hertz, RN Will not experience complications related to urinary retention 02/24/2017 0215 - Progressing by Fransisco Hertz, RN Pain Managment: General experience of comfort will improve 02/24/2017 0215 - Progressing by Fransisco Hertz, RN Safety: Ability to remain free from injury will improve 02/24/2017 0215 - Progressing by Fransisco Hertz, RN Skin Integrity: Risk for impaired skin integrity will decrease 02/24/2017 0215 - Progressing by Fransisco Hertz, RN Clinical Measurements: Ability to maintain clinical measurements within normal limits will improve 02/24/2017 0215 - Progressing by Fransisco Hertz, RN Postoperative complications will be avoided or minimized 02/24/2017 0215 - Progressing by Fransisco Hertz, RN Skin  Integrity: Demonstration of wound healing without infection will improve 02/24/2017 0215 - Progressing by Fransisco Hertz, RN Activity: Ability to maintain or regain function will improve 02/24/2017 0215 - Progressing by Fransisco Hertz, RN Education: Knowledge of the prescribed therapeutic regimen will improve 02/24/2017 0215 - Progressing by Fransisco Hertz, RN Physical Regulation: Postoperative complications will be avoided or minimized 02/24/2017 0215 - Progressing by Fransisco Hertz, RN

## 2017-02-24 NOTE — Discharge Instructions (Signed)
[facility] °[phone] ° °MASTECTOMY: POST OP INSTRUCTIONS ° °Always review your discharge instruction sheet given to you by the facility where your surgery was performed. °IF YOU HAVE DISABILITY OR FAMILY LEAVE FORMS, YOU MUST BRING THEM TO THE OFFICE FOR PROCESSING.   °DO NOT GIVE THEM TO YOUR DOCTOR. °A prescription for pain medication may be given to you upon discharge.  Take your pain medication as prescribed, if needed.  If narcotic pain medicine is not needed, then you may take acetaminophen (Tylenol) or ibuprofen (Advil) as needed. °1. Take your usually prescribed medications unless otherwise directed. °2. If you need a refill on your pain medication, please contact your pharmacy.  They will contact our office to request authorization.  Prescriptions will not be filled after 5pm or on week-ends. °3. You should follow a light diet the first few days after arrival home, such as soup and crackers, etc.  Resume your normal diet the day after surgery. °4. Most patients will experience some swelling and bruising on the chest and underarm.  Ice packs will help.  Swelling and bruising can take several days to resolve.  °5. It is common to experience some constipation if taking pain medication after surgery.  Increasing fluid intake and taking a stool softener (such as Colace) will usually help or prevent this problem from occurring.  A mild laxative (Milk of Magnesia or Miralax) should be taken according to package instructions if there are no bowel movements after 48 hours. °6. Unless discharge instructions indicate otherwise, leave your bandage dry and in place until your next appointment in 3-5 days.  You may take a limited sponge bath.  No tube baths or showers until the drains are removed.  You may have steri-strips (small skin tapes) in place directly over the incision.  These strips should be left on the skin for 7-10 days.  If your surgeon used skin glue on the incision, you may  shower in 24 hours.  The glue will flake off over the next 2-3 weeks.  Any sutures or staples will be removed at the office during your follow-up visit. °7. DRAINS:  If you have drains in place, it is important to keep a list of the amount of drainage produced each day in your drains.  Before leaving the hospital, you should be instructed on drain care.  Call our office if you have any questions about your drains. °8. ACTIVITIES:  You may resume regular (light) daily activities beginning the next day--such as daily self-care, walking, climbing stairs--gradually increasing activities as tolerated.  You may have sexual intercourse when it is comfortable.  Refrain from any heavy lifting or straining until approved by your doctor. °a. You may drive when you are no longer taking prescription pain medication, you can comfortably wear a seatbelt, and you can safely maneuver your car and apply brakes. °b. RETURN TO WORK:  __________________________________________________________ °9. You should see your doctor in the office for a follow-up appointment approximately 3-5 days after your surgery.  Your doctor’s nurse will typically make your follow-up appointment when she calls you with your pathology report.  Expect your pathology report 2-3 business days after your surgery.  You may call to check if you do not hear from us after three days.   °10. OTHER INSTRUCTIONS: ______________________________________________________________________________________________ ____________________________________________________________________________________________ °WHEN TO CALL YOUR DOCTOR: °1. Fever over 101.0 °2. Nausea and/or vomiting °3. Extreme swelling or bruising °4. Continued bleeding from incision. °5. Increased pain, redness, or drainage from the incision. °  The clinic staff is available to answer your questions during regular business hours.  Please don’t hesitate to call and ask to speak to one of the nurses for clinical  concerns.  If you have a medical emergency, go to the nearest emergency room or call 911.  A surgeon from [facility] is always on call at the hospital. °[address], [address], [place], [place]  [address] ? [address] [address], , [place]   [address] °[phone] ? [phone] ? FAX [phone] °Web site: [email or web address] °

## 2017-03-09 ENCOUNTER — Ambulatory Visit: Payer: Medicare Other | Admitting: Nurse Practitioner

## 2017-03-16 ENCOUNTER — Encounter: Payer: Self-pay | Admitting: Nurse Practitioner

## 2017-03-16 ENCOUNTER — Telehealth: Payer: Self-pay | Admitting: Nurse Practitioner

## 2017-03-16 ENCOUNTER — Inpatient Hospital Stay: Payer: Medicare Other | Attending: Nurse Practitioner | Admitting: Nurse Practitioner

## 2017-03-16 VITALS — BP 174/76 | HR 70 | Temp 97.7°F | Resp 20 | Ht 64.5 in | Wt 156.2 lb

## 2017-03-16 DIAGNOSIS — C50212 Malignant neoplasm of upper-inner quadrant of left female breast: Secondary | ICD-10-CM

## 2017-03-16 DIAGNOSIS — E785 Hyperlipidemia, unspecified: Secondary | ICD-10-CM | POA: Diagnosis not present

## 2017-03-16 DIAGNOSIS — Z170 Estrogen receptor positive status [ER+]: Secondary | ICD-10-CM | POA: Diagnosis not present

## 2017-03-16 DIAGNOSIS — M1990 Unspecified osteoarthritis, unspecified site: Secondary | ICD-10-CM | POA: Diagnosis not present

## 2017-03-16 DIAGNOSIS — Z79899 Other long term (current) drug therapy: Secondary | ICD-10-CM | POA: Diagnosis not present

## 2017-03-16 DIAGNOSIS — Z9012 Acquired absence of left breast and nipple: Secondary | ICD-10-CM | POA: Insufficient documentation

## 2017-03-16 DIAGNOSIS — I2510 Atherosclerotic heart disease of native coronary artery without angina pectoris: Secondary | ICD-10-CM | POA: Diagnosis not present

## 2017-03-16 DIAGNOSIS — I10 Essential (primary) hypertension: Secondary | ICD-10-CM | POA: Diagnosis not present

## 2017-03-16 DIAGNOSIS — E119 Type 2 diabetes mellitus without complications: Secondary | ICD-10-CM | POA: Diagnosis not present

## 2017-03-16 MED ORDER — PROCHLORPERAZINE MALEATE 10 MG PO TABS
10.0000 mg | ORAL_TABLET | Freq: Four times a day (QID) | ORAL | 1 refills | Status: DC | PRN
Start: 2017-03-16 — End: 2019-04-04

## 2017-03-16 MED ORDER — LIDOCAINE-PRILOCAINE 2.5-2.5 % EX CREA: TOPICAL_CREAM | CUTANEOUS | 3 refills | Status: DC

## 2017-03-16 MED ORDER — ONDANSETRON HCL 8 MG PO TABS: 8.0000 mg | ORAL_TABLET | Freq: Two times a day (BID) | ORAL | 1 refills | Status: DC | PRN

## 2017-03-16 NOTE — Progress Notes (Addendum)
[facility]  [phone] [phone]  Clinic Follow up Note   Patient Care Team: Manon Hilding, MD as PCP - General (Cardiology) Truitt Merle, MD as Consulting Physician (Hematology) Excell Seltzer, MD as Consulting Physician (General Surgery) Alla Feeling, NP as Nurse Practitioner (Nurse Practitioner) [name], MD as Attending Physician (Cardiology) 03/16/2017  SUMMARY OF ONCOLOGIC HISTORY: Cancer Staging Carcinoma of upper-inner quadrant of left breast in female, estrogen receptor positive ([place]) Staging form: Breast, AJCC 8th Edition - Clinical stage from 01/18/2017: Stage IB (cT2, cN0, cM0, G3, ER: Positive, PR: Positive, HER2: Positive) - Signed by Truitt Merle, MD on 02/10/2017 - Pathologic stage from 02/23/2017: Stage IB (pT3, pN0, cM0, G3, ER: Positive, PR: Positive, HER2: Positive) - Signed by Alla Feeling, NP on 03/16/2017  Carcinoma of upper-outer quadrant of left breast in female, estrogen receptor positive ([place]) Staging form: Breast, AJCC 8th Edition - Clinical stage from 01/18/2017: Stage IB (cT2, cN0, cM0, G2, ER: Positive, PR: Positive, HER2: Negative) - Unsigned - Pathologic stage from 02/23/2017: Stage IB (pT2, pN0(sn), cM0, G3, ER: Positive, PR: Positive, HER2: Negative) - Signed by Alla Feeling, NP on 03/16/2017    Carcinoma of upper-outer quadrant of left breast in female, estrogen receptor positive ([place])   01/13/2017 Mammogram    FINDINGS: In the left breast, a spiculated mass lies in the upper outer quadrant. In the medial posterior left breast, there is a larger lobulated mass. These masses correspond to the palpable abnormalities. No other discrete left breast masses.  In the right breast, there are no discrete masses. There are no areas of architectural distortion.  In both breasts there are scattered calcifications, rounded punctate, with a few other larger coarse dystrophic calcifications, without  significant change from the previous screening mammogram, which is dated 12/04/2009.  Mammographic images were processed with CAD.  On physical exam, there is a firm palpable mass in the upper outer left breast and another firm mass in the medial left breast.       01/13/2017 Breast US    Targeted ultrasound is performed, showing an irregular hypoechoic shadowing mass in the left breast at the 2:30 o'clock position, 5 cm the nipple, measuring 3.3 x 2.8 x 2.6 cm. In the 10:30 o'clock position of the left breast, 5 cm the nipple, there is irregular hypoechoic mass with somewhat more lobulated margins, corresponding to the lobulated mass seen mammographically. This measures 4.8 x 2.9 x 4.8 cm. Both masses show internal vascularity on color Doppler analysis.  Sonographic evaluation of the left axilla shows several nodes with thickened cortices. Status cortex measured is 5 mm. None of these lymph nodes appear enlarged but overall size criteria.  IMPRESSION: 1. Two masses in the left breast, 1 at the 2:30 o'clock position in the other at the 10:30 o'clock position, both highly suspicious for breast carcinoma. 2. Several abnormal left axillary lymph nodes with thickened cortices. 3. No evidence of right breast malignancy.       01/18/2017 Pathology Results    Diagnosis 1. Breast, left, needle core biopsy, 2:30 o'clock - INVASIVE MAMMARY CARCINOMA - MAMMARY CARCINOMA IN-SITU - SEE COMMENT 2. Lymph node, needle/core biopsy, left axilla - NO CARCINOMA IDENTIFIED - SEE COMMENT 3. Breast, left, needle core biopsy, 10:30 o'clock - INVASIVE DUCTAL CARCINOMA - SEE COMMENT  1. PROGNOSTIC INDICATORS Results: IMMUNOHISTOCHEMICAL AND MORPHOMETRIC ANALYSIS PERFORMED MANUALLY Estrogen Receptor: 100%, POSITIVE, STRONG STAINING INTENSITY Progesterone Receptor: 60%, POSITIVE, STRONG STAINING INTENSITY Proliferation Marker Ki67: 60%  1. FLUORESCENCE IN-SITU  HYBRIDIZATION Results: HER2 - NEGATIVE RATIO OF HER2/CEP17 SIGNALS 1.28 AVERAGE HER2 COPY NUMBER PER CELL 2.75  3. PROGNOSTIC INDICATORS Results: IMMUNOHISTOCHEMICAL AND MORPHOMETRIC ANALYSIS PERFORMED MANUALLY Estrogen Receptor: 100%, POSITIVE, STRONG STAINING INTENSITY Progesterone Receptor: 50%, POSITIVE, STRONG STAINING INTENSITY Proliferation Marker Ki67: 70%  3. FLUORESCENCE IN-SITU HYBRIDIZATION Results: HER2 - **POSITIVE** RATIO OF HER2/CEP17 SIGNALS 2.80 AVERAGE HER2 COPY NUMBER PER CELL 6.15  Microscopic Comment 1. The biopsy material shows an infiltrative proliferation of cells with large vesicular nuclei with inconspicuous nucleoli, arranged linearly and in small clusters. Based on the biopsy, the carcinoma appears Nottingham grade 2 of 3 and measures 0.9 cm in greatest linear extent. E-cadherin and prognostic markers (ER/PR/ki-67/HER2-FISH)are pending and will be reported in an addendum. Dr. Lyndon Code reviewed the case and agrees with the above diagnosis.      02/10/2017 Initial Diagnosis    Carcinoma of upper-outer quadrant of left breast in female, estrogen receptor positive ([place])      02/23/2017 Pathology Results    Diagnosis 1. Breast, simple mastectomy, Left Total - INVASIVE DUCTAL CARCINOMA, MULTIFOCAL, NOTTINGHAM GRADE 3/3 (5.3 CM, 3.5 CM) - INVASIVE CARCINOMA INVOLVES THE DERMIS - DUCTAL CARCINOMA IN SITU, INTERMEDIATE GRADE - HYALINIZED FIBROADENOMA - MARGINS UNINVOLVED BY CARCINOMA - NO CARCINOMA IDENTIFIED IN TWO LYMPH NODES (0/2) - SEE ONCOLOGY TABLE AND COMMENT BELOW 2. Lymph node, sentinel, biopsy, Left Axillary - NO CARCINOMA IDENTIFIED IN ONE LYMPH NODE (0/1) 3. Lymph node, sentinel, biopsy - NO CARCINOMA IDENTIFIED IN ONE LYMPH NODE (0/1) 4. Lymph node, sentinel, biopsy - NO CARCINOMA IDENTIFIED IN ONE LYMPH NODE (0/1) 5. Lymph node, sentinel, biopsy - NO CARCINOMA IDENTIFIED IN ONE LYMPH NODE (0/1) Microscopic Comment 1. BREAST, INVASIVE  TUMOR Procedure: Simple mastectomy Laterality: Left Tumor Size: 5. 3 cm, 3.5 cm Histologic Type: Invasive carcinoma of no special type (ductal, not otherwise specified) Grade: Nottingham Grade 3 Tubular Differentiation: 3 Nuclear Pleomorphism: 3 Mitotic Count: 2 Ductal Carcinoma in Situ (DCIS): Present Extent of Tumor: Skin: Invasive carcinoma directly invades into the dermis or epidermis without skin ulceration Margins: Invasive carcinoma, distance from closest margin: 1.5 cm (posterior) DCIS, distance from closest margin: > 1 cm Regional Lymph Nodes: Number of Lymph Nodes Examined: 6 Number of Sentinel Lymph Nodes Examined: 4 Lymph Nodes with Macrometastases: 0 Lymph Nodes with Micrometastases: 0 Lymph Nodes with Isolated Tumor Cells: 0 Treatment effect: No known presurgical therapy Breast Prognostic Profile: See Also ([id number]) Estrogen Receptor: Positive (100%, strong); Positive (100%, strong) Progesterone Receptor: Positive (50%, strong); Positive (50%, strong) Her2: Positive (Ratio 2.80); Negative (Ratio 1.28) Ki-67: 70%; 60% Best tumor block for sendout testing: 1E Pathologic Stage Classification (pTNM, AJCC 8th Edition): Primary Tumor: mpT3 Regional Lymph Nodes: pN0 COMMENT: The two invasive carcinomas in the breast have slightly different morphologies. The larger lesion has a papillary and micropapillary pattern admixed with typical ductal carcinoma while the smaller lesion is more typical of a ductal lesion with linear arrays (E-cadherin performed on biopsy). There are foci within the larger lesion that are concerning for lymphovascular space invasion.      CURRENT THERAPY: PENDING weekly taxol x12 weeks plus herceptin q3 weeks for total 1 year, beginning 03/31/17  INTERVAL HISTORY: Ms. Sommers returns for follow up as scheduled following left breast mastectomy on 02/23/17. She is recovering well with minimal pain. She occasionally has shooting pain at left chest  surgical site, she took tramadol prophylactically at night in the immediate postop period but has now stopped.  She had follow-up  with [name] last Thursday, due to follow-up again this week with the nurse for possible drain removal.  There is still some drainage in the JP drain.  Plans to have first follow-up with rehab this week.  Range of motion is limited. Also had PAC placed. She has itching skin rash confined to her upper middle chest, she wonders if she was bit by something.  Has tried Benadryl spray without much relief. Otherwise she feels well, denies fatigue, fever, chills, cough, dyspnea, nausea, vomiting, constipation, diarrhea. Eating and drinking well.   REVIEW OF SYSTEMS:   Constitutional: Denies fatigue, fevers, chills or abnormal weight loss Eyes: Denies blurriness of vision Ears, nose, mouth, throat, and face: Denies mucositis or sore throat Respiratory: Denies cough, dyspnea or wheezes Cardiovascular: Denies palpitation, chest discomfort or lower extremity swelling Gastrointestinal:  Denies nausea, vomiting, constipation, diarrhea, heartburn or change in bowel habits Skin: (+) itching skin rash confined to upper central chest  Lymphatics: Denies new lymphadenopathy or easy bruising Neurological:Denies tingling or new weaknesses (+) numbness to left axilla  Behavioral/Psych: Mood is stable, no new changes  Breast: (+) s/p left mastectomy  All other systems were reviewed with the patient and are negative.  MEDICAL HISTORY:  Past Medical History:  Diagnosis Date  . Arthritis    "maybe a little in my left knee" (02/23/2017)  . Asthmatic bronchitis   . Cancer of left breast ([place])   . Coronary atherosclerosis    Minor nonobstructive CAD at cardiac catheterization 2009  . Dyslipidemia   . Femoral bruit 7/08   With normal ABIs  . Hypertension   . Lower extremity edema    Chronic  . Pneumonia ~ 1950   in 8th grade  . Type II diabetes mellitus ([place])     SURGICAL  HISTORY: Past Surgical History:  Procedure Laterality Date  . APPENDECTOMY    . BREAST BIOPSY Left 02/2017  . CARDIAC CATHETERIZATION     "long time ago" (02/23/2017)  . MASTECTOMY COMPLETE / SIMPLE W/ SENTINEL NODE BIOPSY Left 02/23/2017   TOTAL MATECTOMY;  LEFT AXILLARY SENTINEL LYMPH NODE BIOPSY ERAS PATHWAY  . MASTECTOMY WITH RADIOACTIVE SEED GUIDED EXCISION AND AXILLARY SENTINEL LYMPH NODE BIOPSY Left 02/23/2017   Procedure: LEFT TOTAL MATECTOMY;  LEFT AXILLARY SENTINEL LYMPH NODE BIOPSY ERAS PATHWAY;  Surgeon: Excell Seltzer, MD;  Location: [facility];  Service: General;  Laterality: Left;  . PILONIDAL CYST EXCISION    . PORTA CATH INSERTION  02/23/2017  . PORTACATH PLACEMENT N/A 02/23/2017   Procedure: INSERTION PORT-A-CATH;  Surgeon: Excell Seltzer, MD;  Location: [facility];  Service: General;  Laterality: N/A;  . SHOULDER ARTHROSCOPY W/ ROTATOR CUFF REPAIR Right     I have reviewed the social history and family history with the patient and they are unchanged from previous note.  ALLERGIES:  has No Known Allergies.  MEDICATIONS:  Current Outpatient Medications  Medication Sig Dispense Refill  . aspirin 81 MG EC tablet Take 81 mg by mouth at bedtime.     . chlorthalidone (HYGROTON) 25 MG tablet Take 25 mg by mouth daily.     . Coenzyme Q10 (COQ10) 100 MG CAPS Take 100 mg by mouth daily.      Marland Kitchen latanoprost (XALATAN) 0.005 % ophthalmic solution Place 1 drop into both eyes at bedtime.    Marland Kitchen losartan (COZAAR) 50 MG tablet Take 50 mg by mouth daily.    . metFORMIN (GLUCOPHAGE) 500 MG tablet Take 1,000 mg by mouth daily with breakfast.     .  vitamin B-12 (CYANOCOBALAMIN) 1000 MCG tablet Take 1,000 mcg by mouth daily.    . traMADol (ULTRAM) 50 MG tablet Take 1 tablet (50 mg total) by mouth every 6 (six) hours as needed (mild pain). 15 tablet 0   No current facility-administered medications for this visit.     PHYSICAL EXAMINATION: ECOG PERFORMANCE STATUS: 1 - Symptomatic but  completely ambulatory  Vitals:   03/16/17 0836  BP: (!) 174/76  Pulse: 70  Resp: 20  Temp: 97.7 F (36.5 C)  SpO2: 100%   Filed Weights   03/16/17 0836  Weight: 156 lb 3.2 oz (70.9 kg)    GENERAL:alert, no distress and comfortable SKIN: skin color, texture, turgor are normal (+) red raised hives rash confined to upper central chest EYES: normal, Conjunctiva are pink and non-injected, sclera clear OROPHARYNX:no exudate, no erythema and lips, buccal mucosa, and tongue normal  NECK: supple, thyroid normal size, non-tender, without nodularity LYMPH:  no palpable cervical, supraclavicular, or axillary lymphadenopathy  LUNGS: clear to auscultation bilaterally with normal breathing effort HEART: regular rate & rhythm and no murmurs and no lower extremity edema ABDOMEN:abdomen soft, non-tender and normal bowel sounds Musculoskeletal:no cyanosis of digits and no clubbing  NEURO: alert & oriented x 3 with fluent speech, no focal motor/sensory deficits BREASTS: s/p left mastectomy, incision is closed with dermabond, healing well. Trace edema. No erythema or drainage. JP drain intact with serosanguinous drainage.  PAC without erythema  LABORATORY DATA:  I have reviewed the data as listed CBC Latest Ref Rng & Units 02/24/2017 02/24/2017 02/18/2017  WBC 4.0 - 10.5 K/uL - 4.7 4.3  Hemoglobin 12.0 - 15.0 g/dL 10.3(L) 6.7(LL) 13.3  Hematocrit 36.0 - 46.0 % 31.1(L) 20.9(L) 39.6  Platelets 150 - 400 K/uL - 133(L) 239     CMP Latest Ref Rng & Units 02/24/2017 02/18/2017 02/11/2017  Glucose 65 - 99 mg/dL 203(H) 103(H) 86  BUN 6 - 20 mg/dL 6 7 9   Creatinine 0.44 - 1.00 mg/dL 0.80 0.72 0.84  Sodium 135 - 145 mmol/L 132(L) 130(L) 133(L)  Potassium 3.5 - 5.1 mmol/L 3.7 3.5 3.3  Chloride 101 - 111 mmol/L 97(L) 94(L) 95(L)  CO2 22 - 32 mmol/L 24 24 26   Calcium 8.9 - 10.3 mg/dL 8.6(L) 9.4 9.6  Total Protein 6.4 - 8.3 g/dL - - 7.5  Total Bilirubin 0.2 - 1.2 mg/dL - - 0.9  Alkaline Phos 40 - 150 U/L -  - 55  AST 5 - 34 U/L - - 19  ALT 0 - 55 U/L - - 16   PATHOLOGY  LEFT SIMPLE MASTECTOMY WITH SNLB 02/23/17 per [name] Diagnosis 1. Breast, simple mastectomy, Left Total - INVASIVE DUCTAL CARCINOMA, MULTIFOCAL, NOTTINGHAM GRADE 3/3 (5.3 CM, 3.5 CM) - INVASIVE CARCINOMA INVOLVES THE DERMIS - DUCTAL CARCINOMA IN SITU, INTERMEDIATE GRADE - HYALINIZED FIBROADENOMA - MARGINS UNINVOLVED BY CARCINOMA - NO CARCINOMA IDENTIFIED IN TWO LYMPH NODES (0/2) - SEE ONCOLOGY TABLE AND COMMENT BELOW 2. Lymph node, sentinel, biopsy, Left Axillary - NO CARCINOMA IDENTIFIED IN ONE LYMPH NODE (0/1) 3. Lymph node, sentinel, biopsy - NO CARCINOMA IDENTIFIED IN ONE LYMPH NODE (0/1) 4. Lymph node, sentinel, biopsy - NO CARCINOMA IDENTIFIED IN ONE LYMPH NODE (0/1) 5. Lymph node, sentinel, biopsy - NO CARCINOMA IDENTIFIED IN ONE LYMPH NODE (0/1) Microscopic Comment 1. BREAST, INVASIVE TUMOR Procedure: Simple mastectomy Laterality: Left Tumor Size: 5. 3 cm, 3.5 cm Histologic Type: Invasive carcinoma of no special type (ductal, not otherwise specified) Grade: Nottingham Grade 3 Tubular Differentiation:  3 Nuclear Pleomorphism: 3 Mitotic Count: 2 Ductal Carcinoma in Situ (DCIS): Present Extent of Tumor: Skin: Invasive carcinoma directly invades into the dermis or epidermis without skin ulceration Margins: Invasive carcinoma, distance from closest margin: 1.5 cm (posterior) DCIS, distance from closest margin: > 1 cm Regional Lymph Nodes: Number of Lymph Nodes Examined: 6 Number of Sentinel Lymph Nodes Examined: 4 Lymph Nodes with Macrometastases: 0 Lymph Nodes with Micrometastases: 0 Lymph Nodes with Isolated Tumor Cells: 0 Treatment effect: No known presurgical therapy Breast Prognostic Profile: See Also ([id number]) Estrogen Receptor: Positive (100%, strong); Positive (100%, strong) Progesterone Receptor: Positive (50%, strong); Positive (50%, strong) Her2: Positive (Ratio 2.80); Negative  (Ratio 1.28) Ki-67: 70%; 60% Best tumor block for sendout testing: 1E Pathologic Stage Classification (pTNM, AJCC 8th Edition): Primary Tumor: mpT3 Regional Lymph Nodes: pN0 COMMENT: The two invasive carcinomas in the breast have slightly different morphologies. The larger lesion has a papillary and micropapillary pattern admixed with typical ductal carcinoma while the smaller lesion is more typical of a ductal lesion with linear arrays (E-cadherin performed on biopsy). There are foci within the larger lesion that are concerning for lymphovascular space invasion.    02/17/17 ECHO: Study Conclusions - Left ventricle: The cavity size was normal. Wall thickness was   increased in a pattern of mild LVH. Systolic function was normal.   The estimated ejection fraction was in the range of 55% to 60%.   Normal GLS at -20%. Wall motion was normal; there were no   regional wall motion abnormalities. Doppler parameters are   consistent with abnormal left ventricular relaxation (grade 1   diastolic dysfunction). The E/e&' ratio is between 8-15,   suggesting indeterminate LV filling pressure. - Aortic valve: Sclerosis without stenosis. - Aorta: Mildly dilated aorta. Aortic root dimension: 39 mm (ED).   Ascending aortic diameter: 38 mm (S). - Left atrium: The atrium was normal in size. - Inferior vena cava: The vessel was normal in size. The   respirophasic diameter changes were in the normal range (>= 50%),   consistent with normal central venous pressure. Impressions: - Compared to a prior study in 2013, the LVEF is slightly lower but   normal at 55-60%. There is mild LVH, normal GLS at -20%, grade 1   DD and indeterminate LV filling pressure. The aorta is mildly   dilated at 3.8-3.9 cm.   RADIOGRAPHIC STUDIES: I have personally reviewed the radiological images as listed and agreed with the findings in the report. No results found.   ASSESSMENT & PLAN: Penny Huynh is an 84 year old with  past medical history positive for HTN, DM, CAD now with newly diagnosed left breast cancer  1.  Multifocal invasive ductal carcinoma of the left breast of female; carcinoma of the upper-outer quadrant of left breast and female, ER/PR positive, HER-2 negative pT2N0M0, G2, Stage 1B; IDC of the upper-inner quadrant left breast, ER+/PR+/HER2+, pT3N0M0 G3, stage IB Ms. Schlender appears stable today; she is recovering well from left mastectomy. She has JP drain in place, possible removal this week 2/14.  -We reviewed her pathology results in detail, she had multifocal invasive ductal carcinoma, G3, the upper inner quadrant focus spanning 5.3 cm, ER+/PR+/HER2+; the upper outer quadrant focus spanned 3.5 cm, ER+/PR+/HER2- node-negative. I reviewed with pathology.  -[name] reviewed 25-35% recurrence rate for HER2 positive breast cancer; she recommends adjuvant chemotherapy TCH q3 weeks vs weekly taxol x12 plus herceptin q3; given her age and comorbidities, [name] recommends adjuvant taxol weekly x12 weeks plus  herceptin q3 weeks for 1 year. she had PAC placed. -Invasive carcinoma involves the dermis; she would benefit from adjuvant radiation therapy following chemotherapy. Will refer her. We briefly discussed adjuvant endocrine therapy following radiation, will discuss further as that time approaches.   -Chemotherapy consent: Side effects including but not not limited to fatigue, nausea, vomiting, diarrhea, hair loss, neuropathy, fluid retention, renal and kidney dysfunction, neutropenic fever, need for blood transfusion, bleeding, and potentially reversible heart failure were discussed with patient in great detail. She agrees to proceed. -she had echo 02/17/17 which shows normal EF 55-60% with mild LVH and grade 1 DD; I will send message to her cardiologist to follow while on herceptin; will repeat echo q3 months while on therapy. -She will focus on recovery in the meantime; if she does not get drain removed  this week, she will call us and we will postpone chemo; she will begin rehab this week -will escribe anti-emetics and EMLA cream for PAC.  -She will attend chemo class and return for f/u and to begin chemotherapy in 2 weeks.   2. HTN, CAD, DM -BP was mildly elevated in clinic today; she continues chlorthalidone and losartan for BP, morformin for DM -We reviewed DM can worsen while on chemotherapy especially with steroid administration; will monitor closely.  -I recommend she eat well and be active within limitations of post-op recovery.   3. Skin rash -She has red raised pruritic rash confined to her upper chest, appears to be allergic reaction. Recommend she apply topical steroid cream such as hydrocortisone PRN and continue bendaryl spray for itching.   PLAN: -Chemo edu class in 1 week -Return in 2 weeks for f/u and 1st cycle taxol with herceptin -Message sent to cardiologist for f/u -Refer to radiation -Topical benadryl and steroid cream to skin rash  All questions were answered. The patient knows to call the clinic with any problems, questions or concerns. No barriers to learning was detected. I spent 30 minutes counseling the patient face to face. The total time spent in the appointment was 45 minutes and more than 50% was on counseling and review of test results     Alla Feeling, NP 03/16/17   Addendum  I have seen the patient, examined her. I agree with the assessment and and plan and have edited the notes.   Mrs Ranum has complete surgical resection for her left breast multifocal (2, 5.3cm and 3.5cm) ductal carcinoma, lymph nodes were negative.  Both tumors are ER and PR positive, the larger one is positive for HER-2, the smaller one is negative for HER-2.  Due to the high risk of cancer recurrence for HER-2 positive breast cancer, I recommend her to consider adjuvant chemotherapy.  Given her advanced age, node-negative disease, and mobilities, I recommend her to have weekly  Taxol for 12 weeks, and Herceptin for 1 year as adjuvant therapy.,  The potential benefit and the risk of above agents were discussed with her in details.  She agrees to proceed.  Plan to start in 2 weeks, when she recovers well from her surgery.  I recommend her to follow-up with her cardiologist with repeated echo every 3-4 months during her Herceptin therapy.  She would benefit from adjuvant breast radiation, and adjuvant antiestrogen therapy, which will start after her breast radiation.  We also reviewed breast cancer surveillance and she voiced good understanding.  We will see her back in 2 weeks before her first dose chemo.  I spent a total of 40  mins for her visit today, >50% on face-to-face counseling.  Truitt Merle  03/16/2017

## 2017-03-16 NOTE — Telephone Encounter (Signed)
Scheduled appt per 2/12 los - Gave patient AVS and calender per los.  

## 2017-03-17 ENCOUNTER — Telehealth: Payer: Self-pay | Admitting: Cardiovascular Disease

## 2017-03-17 ENCOUNTER — Telehealth: Payer: Self-pay | Admitting: *Deleted

## 2017-03-17 ENCOUNTER — Other Ambulatory Visit: Payer: Self-pay

## 2017-03-17 ENCOUNTER — Other Ambulatory Visit: Payer: Medicare Other

## 2017-03-17 DIAGNOSIS — Z09 Encounter for follow-up examination after completed treatment for conditions other than malignant neoplasm: Secondary | ICD-10-CM

## 2017-03-17 DIAGNOSIS — I519 Heart disease, unspecified: Secondary | ICD-10-CM

## 2017-03-17 DIAGNOSIS — I447 Left bundle-branch block, unspecified: Secondary | ICD-10-CM

## 2017-03-17 NOTE — Telephone Encounter (Signed)
Placed echo orders

## 2017-03-17 NOTE — Telephone Encounter (Signed)
Echo scheduled in [facility] on May 05, 2017

## 2017-03-17 NOTE — Telephone Encounter (Signed)
-----   Message from Weston Anna sent at 03/17/2017  9:13 AM EST ----- I have scheduled Echo for 05/05/17 her in [place].  Please enter order so that I can attach it.   Thank you  ----- Message ----- From: [name], MD Sent: 03/16/2017   2:31 PM To: Adella Nissen, CMA  Have her see me in April with an echo beforehand (Dx: on chemo).   ----- Message ----- From: Weston Anna Sent: 03/16/2017   1:56 PM To: [name], MD, Chanda Busing  What time frame are you wanting to follow up with patient? ----- Message ----- From: [name], MD Sent: 03/16/2017   1:39 PM To: Chanda Busing, Weston Anna, #  Absolutely. I would be happy to do so.   ----- Message ----- From: Alla Feeling, NP Sent: 03/16/2017   1:19 PM To: [name], MD, Truitt Merle, MD  Dr. Bronson Ing,   We are planning to begin adjuvant taxol plus herceptin in 2 weeks for her breast cancer. Last echo on 02/17/17 showed EF 55%-60%, with mild LVH and grade 1 DD. Can you please see her in f/u for ongoing monitoring on herceptin which will last 1 year.   Thanks you, Cira Rue, NP

## 2017-03-18 ENCOUNTER — Encounter: Payer: Self-pay | Admitting: Physical Therapy

## 2017-03-18 ENCOUNTER — Ambulatory Visit: Payer: Medicare Other | Attending: General Surgery | Admitting: Physical Therapy

## 2017-03-18 ENCOUNTER — Other Ambulatory Visit: Payer: Self-pay

## 2017-03-18 DIAGNOSIS — C50912 Malignant neoplasm of unspecified site of left female breast: Secondary | ICD-10-CM | POA: Insufficient documentation

## 2017-03-18 DIAGNOSIS — Z483 Aftercare following surgery for neoplasm: Secondary | ICD-10-CM | POA: Diagnosis not present

## 2017-03-18 DIAGNOSIS — M25612 Stiffness of left shoulder, not elsewhere classified: Secondary | ICD-10-CM | POA: Diagnosis not present

## 2017-03-18 DIAGNOSIS — R293 Abnormal posture: Secondary | ICD-10-CM | POA: Diagnosis not present

## 2017-03-18 DIAGNOSIS — Z170 Estrogen receptor positive status [ER+]: Secondary | ICD-10-CM | POA: Insufficient documentation

## 2017-03-18 NOTE — Therapy (Signed)
[place], [place], [address] Phone: [phone]   Fax:  [phone]  Physical Therapy Evaluation  Patient Details  Name: Penny Huynh MRN: [id number] Date of Birth: 18-Oct-1933 Referring Provider: [name]   Encounter Date: 03/18/2017  PT End of Session - 03/18/17 1140    Visit Number  2    Number of Visits  10    Date for PT Re-Evaluation  04/15/17    PT Start Time  0092    PT Stop Time  1135    PT Time Calculation (min)  60 min    Activity Tolerance  Patient tolerated treatment well    Behavior During Therapy  [facility] for tasks assessed/performed       Past Medical History:  Diagnosis Date  . Arthritis    "maybe a little in my left knee" (02/23/2017)  . Asthmatic bronchitis   . Cancer of left breast ([place])   . Coronary atherosclerosis    Minor nonobstructive CAD at cardiac catheterization 2009  . Dyslipidemia   . Femoral bruit 7/08   With normal ABIs  . Hypertension   . Lower extremity edema    Chronic  . Pneumonia ~ 1950   in 8th grade  . Type II diabetes mellitus ([place])     Past Surgical History:  Procedure Laterality Date  . APPENDECTOMY    . BREAST BIOPSY Left 02/2017  . CARDIAC CATHETERIZATION     "long time ago" (02/23/2017)  . MASTECTOMY COMPLETE / SIMPLE W/ SENTINEL NODE BIOPSY Left 02/23/2017   TOTAL MATECTOMY;  LEFT AXILLARY SENTINEL LYMPH NODE BIOPSY ERAS PATHWAY  . MASTECTOMY WITH RADIOACTIVE SEED GUIDED EXCISION AND AXILLARY SENTINEL LYMPH NODE BIOPSY Left 02/23/2017   Procedure: LEFT TOTAL MATECTOMY;  LEFT AXILLARY SENTINEL LYMPH NODE BIOPSY ERAS PATHWAY;  Surgeon: Excell Seltzer, MD;  Location: [place];  Service: General;  Laterality: Left;  . PILONIDAL CYST EXCISION    . PORTA CATH INSERTION  02/23/2017  . PORTACATH PLACEMENT N/A 02/23/2017   Procedure: INSERTION PORT-A-CATH;  Surgeon: Excell Seltzer, MD;  Location: [facility];  Service: General;   Laterality: N/A;  . SHOULDER ARTHROSCOPY W/ ROTATOR CUFF REPAIR Right     There were no vitals filed for this visit.   Subjective Assessment - 03/18/17 1038    Subjective  Patient underwent a left mastectomy and sentinel node biopsy (6 negative nodes) on 02/23/17. She will begin chemotherapy 03/31/17 as long as drain is removed today. She has 2 different breast cancers. Both are ER/PR positive but one is HER2 negative and one is HER2 positive. She will undergo radiation and anti-estrogen therapy.    Pertinent History  Patient underwent a left mastectomy and sentinel node biopsy (6 negative nodes) on 02/23/17. She will begin chemotherapy 03/31/17 as long as drain is removed today. She has 2 different breast cancers. Both are ER/PR positive but one is HER2 negative and one is HER2 positive. History of right shoulder scope 20 years ago and has diabetes and hypertension, both of which are well controlled.    Patient Stated Goals  See if my arm is ok    Currently in Pain?  No/denies         [facility] PT Assessment - 03/18/17 0001      Assessment   Medical Diagnosis  s/p left mastectomy and SLNB    Referring Provider  [name]    Onset Date/Surgical Date  02/23/17    Hand Dominance  Right    Prior Therapy  Baseline assessment      Precautions   Precautions  Other (comment)    Precaution Comments  Left arm lymphedema risk      Restrictions   Weight Bearing Restrictions  No      Home Environment   Living Environment  Private residence    Living Arrangements  Spouse/significant other    Available Help at Discharge  Family      Prior Function   Level of [place]  Retired    Leisure  She is not currently exercising      Cognition   Overall Cognitive Status  Within Functional Limits for tasks assessed      Observation/Other Assessments   Observations  Left chest drain still in place; she is hopeful it will be removed today at her MD appointment     Skin Integrity  Incision appears to be healing normally but glue is still present      Posture/Postural Control   Posture/Postural Control  Postural limitations    Postural Limitations  Rounded Shoulders;Forward head      ROM / Strength   AROM / PROM / Strength  AROM      AROM   AROM Assessment Site  Shoulder    Right/Left Shoulder  Left    Left Shoulder Extension  34 Degrees    Left Shoulder Flexion  65 Degrees    Left Shoulder ABduction  52 Degrees      Palpation   Palpation comment  Incision appears to be healing well. Drain site covered by bandage.        LYMPHEDEMA/ONCOLOGY QUESTIONNAIRE - 03/18/17 1055      Type   Cancer Type  Left breast      Surgeries   Mastectomy Date  02/23/17    Sentinel Lymph Node Biopsy Date  02/23/17    Number Lymph Nodes Removed  6      Treatment   Active Chemotherapy Treatment  No Begins end of this month    Past Chemotherapy Treatment  No    Active Radiation Treatment  No    Past Radiation Treatment  No    Current Hormone Treatment  No    Past Hormone Therapy  No      What other symptoms do you have   Are you Having Heaviness or Tightness  No    Are you having Pain  No    Are you having pitting edema  No    Is it Hard or Difficult finding clothes that fit  No    Do you have infections  No    Is there Decreased scar mobility  Yes    Stemmer Sign  No      Lymphedema Assessments   Lymphedema Assessments  Upper extremities      Right Upper Extremity Lymphedema   10 cm Proximal to Olecranon Process  27.3 cm    Olecranon Process  24.9 cm    10 cm Proximal to Ulnar Styloid Process  21.2 cm    Just Proximal to Ulnar Styloid Process  16.7 cm    Across Hand at [employer]  20 cm    At [place] of 2nd Digit  6.5 cm      Left Upper Extremity Lymphedema   10 cm Proximal to Olecranon Process  28.8 cm    Olecranon Process  24 cm    10 cm Proximal to Ulnar Styloid  Process  21.2 cm    Just Proximal to Ulnar Styloid Process  16.4 cm     Across Hand at [employer]  19.5 cm    At [place] of 2nd Digit  6.4 cm        Quick Dash - 03/18/17 0001    Open a tight or new jar  Unable    Do heavy household chores (wash walls, wash floors)  Unable    Carry a shopping bag or briefcase  Moderate difficulty    Wash your back  Unable    Use a knife to cut food  Unable    Recreational activities in which you take some force or impact through your arm, shoulder, or hand (golf, hammering, tennis)  Unable    During the past week, to what extent has your arm, shoulder or hand problem interfered with your normal social activities with family, friends, neighbors, or groups?  Not at all    During the past week, to what extent has your arm, shoulder or hand problem limited your work or other regular daily activities  Slightly    Arm, shoulder, or hand pain.  Mild    Tingling (pins and needles) in your arm, shoulder, or hand  None    Difficulty Sleeping  No difficulty    DASH Score  54.55 %       Objective measurements completed on examination: See above findings.                   PT Long Term Goals - 03/18/17 1145      PT LONG TERM GOAL #1   Title  Patient will be independent in her home exercise program for shoulder ROM.    Time  4    Period  Weeks    Status  New      PT LONG TERM GOAL #2   Title  Patient will demonstrate shoulder flexion AROM to be >/= 140 degrees for increased ease reaching overhead.    Baseline  65 degrees    Time  4    Period  Weeks    Status  New      PT LONG TERM GOAL #3   Title  Patient will demonstrate shoulder abduction AROM to be >/= 140 degrees for increased ease reaching overhead.    Baseline  169 degrees    Time  4    Period  Weeks    Status  New      PT LONG TERM GOAL #4   Title  Patient will score </= 10 on the DASH indicating increased shoulder function.    Baseline  54.55    Time  4    Period  Weeks    Status  New      PT LONG TERM GOAL #5   Title  Patient will  verbalize she understands lymphedema risk reduction.    Time  4    Period  [facility] Goals - 02/08/17 2024      Patient will be able to verbalize understanding of pertinent lymphedema risk reduction practices relevant to her diagnosis specifically related to skin care.   Status  Achieved      Patient will be able to return demonstrate and/or verbalize understanding of the post-op home exercise program related to regaining shoulder range of motion.   Status  Achieved      Patient will be  able to verbalize understanding of the importance of attending the postoperative After Breast Cancer Class for further lymphedema risk reduction education and therapeutic exercise.   Status  Achieved            Plan - 03/18/17 1140    Clinical Impression Statement  Patient is 3 weeks s/p left mastectomy with a SLNB (6 negative nodes). She still has a drain in place which she hopes will be removed at her MD visit this afternoon. Her shoulder ROM is very limited and stiff. Her incision appears to be healing as expected. She is otherwise very active and appears healthy and much younger than her age. She will begin chemotherapy in about 2 weeks which will be followed by radiation and anti-estrogen therapy. She will benefit from PT now to regain shoulder ROM and function.    Rehab Potential  Excellent    PT Frequency  2x / week    PT Duration  4 weeks    PT Treatment/Interventions  ADLs/Self Care Home Management;Therapeutic exercise;Patient/family education;Orthotic Fit/Training;Manual techniques;Passive range of motion;Manual lymph drainage;Scar mobilization;DME Instruction    PT Next Visit Plan  PROM left shoulder if drain is removed; pulleys, ball up wall, etc    Consulted and Agree with Plan of Care  Patient;Family member/caregiver    Family Member Consulted  Husband       Patient will benefit from skilled therapeutic intervention in order to improve the following  deficits and impairments:  Postural dysfunction, Decreased knowledge of precautions, Decreased knowledge of use of DME, Impaired UE functional use, Decreased range of motion, Decreased strength, Increased fascial restricitons, Decreased scar mobility  Visit Diagnosis: Malignant neoplasm of left breast in female, estrogen receptor positive, unspecified site of breast ([place]) - Plan: PT plan of care cert/re-cert  Abnormal posture - Plan: PT plan of care cert/re-cert  Stiffness of left shoulder, not elsewhere classified - Plan: PT plan of care cert/re-cert  Aftercare following surgery for neoplasm - Plan: PT plan of care cert/re-cert     Problem List Patient Active Problem List   Diagnosis Date Noted  . Stage II breast cancer, left ([place]) 02/23/2017  . Carcinoma of upper-outer quadrant of left breast in female, estrogen receptor positive ([place]) 02/10/2017  . Carcinoma of upper-inner quadrant of left breast in female, estrogen receptor positive ([place]) 02/10/2017  . Chest pain 07/18/2015  . Hypokalemia 07/18/2015  . Vertigo 07/18/2015  . LBBB (left bundle branch block) 05/28/2009  . AODM 12/21/2007  . Essential hypertension, benign 12/21/2007  . DIASTOLIC DYSFUNCTION 99/35/7017  . FEMORAL BRUIT, RIGHT 12/21/2007  . DYSPNEA 12/21/2007    Annia Friendly, PT 03/18/17 11:55 AM  [place], [place], [address] Phone: [phone]   Fax:  [phone]  Name: DEVORAH GIVHAN MRN: [id number] Date of Birth: Jun 04, 1933

## 2017-03-19 ENCOUNTER — Telehealth: Payer: Self-pay | Admitting: *Deleted

## 2017-03-19 ENCOUNTER — Telehealth: Payer: Self-pay | Admitting: Nurse Practitioner

## 2017-03-19 NOTE — Telephone Encounter (Signed)
Tried to call the patient.

## 2017-03-19 NOTE — Telephone Encounter (Addendum)
Received vm message from pt stating that she is scheduled for chemo on 03/31/17 & she was to call if drain was not removed on the 14th.  She states that it is not out.  Dr Burr Medico informed.  Returned call to pt & she states that she had 35 cc drainage yest & was told to call surgeon when it was less that 20 cc.  Informed to touch base with [place] on 03/23/17 when she is here for education & let us know what her drainage is then & we will discuss with Dr Burr Medico.  She expressed understanding & appreciation for call back.

## 2017-03-23 ENCOUNTER — Encounter: Payer: Self-pay | Admitting: Physical Therapy

## 2017-03-24 ENCOUNTER — Encounter: Payer: Self-pay | Admitting: *Deleted

## 2017-03-25 ENCOUNTER — Encounter: Payer: Self-pay | Admitting: *Deleted

## 2017-03-25 ENCOUNTER — Inpatient Hospital Stay: Payer: Medicare Other

## 2017-03-26 ENCOUNTER — Telehealth: Payer: Self-pay | Admitting: Hematology

## 2017-03-26 ENCOUNTER — Telehealth: Payer: Self-pay | Admitting: *Deleted

## 2017-03-26 NOTE — Telephone Encounter (Signed)
Spoke with patient regarding 2/27 appointment bing cancelled and visit with YF added to her 3/6 visit per 2/22 sch msg

## 2017-03-26 NOTE — Telephone Encounter (Signed)
Please cancel her appointments on 2/27 and add f/u on 3/6 then, thanks   Truitt Merle MD

## 2017-03-26 NOTE — Telephone Encounter (Signed)
Pt called requesting a call back from nurse.  Spoke with pt, and was informed that pt still has drain catheter in Left breast.   Stated drainage amount 25 cc for past 2 days.   Catheter will be removed if drainage is 20 cc or less.   Instructed pt to call office back as soon as her drain catheter is removed.  Pt wanted to know about her appts on  03/31/17.   [name] notified. Pt's    Phone      [phone].

## 2017-03-30 DIAGNOSIS — M79661 Pain in right lower leg: Secondary | ICD-10-CM | POA: Diagnosis not present

## 2017-03-30 DIAGNOSIS — Z6827 Body mass index (BMI) 27.0-27.9, adult: Secondary | ICD-10-CM | POA: Diagnosis not present

## 2017-03-31 ENCOUNTER — Other Ambulatory Visit: Payer: Medicare Other

## 2017-03-31 ENCOUNTER — Ambulatory Visit: Payer: Medicare Other | Admitting: Nurse Practitioner

## 2017-03-31 ENCOUNTER — Other Ambulatory Visit: Payer: Self-pay | Admitting: Cardiovascular Disease

## 2017-03-31 ENCOUNTER — Ambulatory Visit: Payer: Medicare Other

## 2017-03-31 DIAGNOSIS — I447 Left bundle-branch block, unspecified: Secondary | ICD-10-CM

## 2017-03-31 DIAGNOSIS — D493 Neoplasm of unspecified behavior of breast: Secondary | ICD-10-CM

## 2017-03-31 DIAGNOSIS — D649 Anemia, unspecified: Secondary | ICD-10-CM | POA: Diagnosis not present

## 2017-03-31 DIAGNOSIS — R600 Localized edema: Secondary | ICD-10-CM | POA: Diagnosis not present

## 2017-03-31 DIAGNOSIS — L959 Vasculitis limited to the skin, unspecified: Secondary | ICD-10-CM | POA: Diagnosis not present

## 2017-03-31 DIAGNOSIS — I519 Heart disease, unspecified: Secondary | ICD-10-CM

## 2017-03-31 DIAGNOSIS — Z5111 Encounter for antineoplastic chemotherapy: Secondary | ICD-10-CM

## 2017-03-31 DIAGNOSIS — M7989 Other specified soft tissue disorders: Secondary | ICD-10-CM | POA: Diagnosis not present

## 2017-03-31 DIAGNOSIS — M79604 Pain in right leg: Secondary | ICD-10-CM | POA: Diagnosis not present

## 2017-04-02 ENCOUNTER — Telehealth: Payer: Self-pay | Admitting: *Deleted

## 2017-04-02 NOTE — Telephone Encounter (Signed)
TC from patient inquiring about her appt next for chemo. She states her chemo had been postponed due to her drain still being post-op. Pt states that the drain was removed today. She wanted to know if she should keep her appts for next week, 04/07/17. Advised pt to keep her appts as scheduled.  She voiced understanding.

## 2017-04-06 ENCOUNTER — Encounter: Payer: Self-pay | Admitting: Physical Therapy

## 2017-04-06 ENCOUNTER — Ambulatory Visit: Payer: Medicare Other | Attending: General Surgery | Admitting: Physical Therapy

## 2017-04-06 DIAGNOSIS — R293 Abnormal posture: Secondary | ICD-10-CM | POA: Insufficient documentation

## 2017-04-06 DIAGNOSIS — C50912 Malignant neoplasm of unspecified site of left female breast: Secondary | ICD-10-CM | POA: Diagnosis not present

## 2017-04-06 DIAGNOSIS — Z483 Aftercare following surgery for neoplasm: Secondary | ICD-10-CM | POA: Diagnosis not present

## 2017-04-06 DIAGNOSIS — M25612 Stiffness of left shoulder, not elsewhere classified: Secondary | ICD-10-CM | POA: Diagnosis not present

## 2017-04-06 DIAGNOSIS — Z170 Estrogen receptor positive status [ER+]: Secondary | ICD-10-CM | POA: Diagnosis not present

## 2017-04-06 NOTE — Therapy (Signed)
[place], [place], [address] Phone: [phone]   Fax:  [phone]  Physical Therapy Treatment  Patient Details  Name: Penny Huynh MRN: [id number] Date of Birth: 1933/06/03 Referring Provider: [name]   Encounter Date: 04/06/2017  PT End of Session - 04/06/17 1533    Visit Number  3    Number of Visits  10    Date for PT Re-Evaluation  04/15/17    PT Start Time  1430    PT Stop Time  1515    PT Time Calculation (min)  45 min    Activity Tolerance  Patient tolerated treatment well    Behavior During Therapy  [facility] for tasks assessed/performed       Past Medical History:  Diagnosis Date  . Arthritis    "maybe a little in my left knee" (02/23/2017)  . Asthmatic bronchitis   . Cancer of left breast ([place])   . Coronary atherosclerosis    Minor nonobstructive CAD at cardiac catheterization 2009  . Dyslipidemia   . Femoral bruit 7/08   With normal ABIs  . Hypertension   . Lower extremity edema    Chronic  . Pneumonia ~ 1950   in 8th grade  . Type II diabetes mellitus ([place])     Past Surgical History:  Procedure Laterality Date  . APPENDECTOMY    . BREAST BIOPSY Left 02/2017  . CARDIAC CATHETERIZATION     "long time ago" (02/23/2017)  . MASTECTOMY COMPLETE / SIMPLE W/ SENTINEL NODE BIOPSY Left 02/23/2017   TOTAL MATECTOMY;  LEFT AXILLARY SENTINEL LYMPH NODE BIOPSY ERAS PATHWAY  . MASTECTOMY WITH RADIOACTIVE SEED GUIDED EXCISION AND AXILLARY SENTINEL LYMPH NODE BIOPSY Left 02/23/2017   Procedure: LEFT TOTAL MATECTOMY;  LEFT AXILLARY SENTINEL LYMPH NODE BIOPSY ERAS PATHWAY;  Surgeon: Excell Seltzer, MD;  Location: [facility];  Service: General;  Laterality: Left;  . PILONIDAL CYST EXCISION    . PORTA CATH INSERTION  02/23/2017  . PORTACATH PLACEMENT N/A 02/23/2017   Procedure: INSERTION PORT-A-CATH;  Surgeon: Excell Seltzer, MD;  Location: [facility];  Service: General;   Laterality: N/A;  . SHOULDER ARTHROSCOPY W/ ROTATOR CUFF REPAIR Right     There were no vitals filed for this visit.      [facility] PT Assessment - 04/06/17 0001      Observation/Other Assessments   Observations  pt has guitar strng cording in left antecubital fossa and dimpling in left aupper arm  she has fullness in left axilla and also in left lateral chest and back                   [facility] Adult PT Treatment/Exercise - 04/06/17 0001      Self-Care   Self-Care  Other Self-Care Comments    Other Self-Care Comments   provided small tg soft with deep fold at the top for support to lymphatics of left arm and small chip pack to be worn at left axilla       Neck Exercises: Seated   Other Seated Exercise  neck ROM and shoulder backward circles       Shoulder Exercises: Supine   Protraction  AROM;Left;10 reps    Other Supine Exercises  supine dowel rod flexion x 10 reps.  Pt had too much pain and pulling with dowel rod abduction       Shoulder Exercises: Sidelying   Other Sidelying Exercises  small circles pointed to the ceiling  Manual Therapy   Manual Therapy  Manual Lymphatic Drainage (MLD);Passive ROM    Manual therapy comments  5 diaphragmatic breaths, stationary circles on left anterior chest toward right side , shoulder cirecles and left upper arm to wrist and return with bending at tight cord in antecubital fossa.  Then to right sidelying for posterior interaxillary anastamosis, lateral chest an back at full areas     Passive ROM  to external and internal rotation with arm supported in neutral position in the glenoid , gentle PROM in all direcions within tolerance              PT Education - 04/06/17 1533    Education provided  Yes    Education Details  supine dowel rod flexion     Person(s) Educated  Patient    Methods  Explanation;Handout    Comprehension  Verbalized understanding;Returned demonstration          PT Long Term Goals - 03/18/17  1145      PT LONG TERM GOAL #1   Title  Patient will be independent in her home exercise program for shoulder ROM.    Time  4    Period  Weeks    Status  New      PT LONG TERM GOAL #2   Title  Patient will demonstrate shoulder flexion AROM to be >/= 140 degrees for increased ease reaching overhead.    Baseline  65 degrees    Time  4    Period  Weeks    Status  New      PT LONG TERM GOAL #3   Title  Patient will demonstrate shoulder abduction AROM to be >/= 140 degrees for increased ease reaching overhead.    Baseline  169 degrees    Time  4    Period  Weeks    Status  New      PT LONG TERM GOAL #4   Title  Patient will score </= 10 on the DASH indicating increased shoulder function.    Baseline  54.55    Time  4    Period  Weeks    Status  New      PT LONG TERM GOAL #5   Title  Patient will verbalize she understands lymphedema risk reduction.    Time  4    Period  [facility] Goals - 02/08/17 2024      Patient will be able to verbalize understanding of pertinent lymphedema risk reduction practices relevant to her diagnosis specifically related to skin care.   Status  Achieved      Patient will be able to return demonstrate and/or verbalize understanding of the post-op home exercise program related to regaining shoulder range of motion.   Status  Achieved      Patient will be able to verbalize understanding of the importance of attending the postoperative After Breast Cancer Class for further lymphedema risk reduction education and therapeutic exercise.   Status  Achieved           Plan - 04/06/17 1534    Clinical Impression Statement  Pt comes in wearing post op binder as she has just had drains removed a few days ago.  She has tight visible cording in left elbow and axilla with visible fullness.  She begain with supine dowel exercise today and manual work to help with comfort and cording. Anticipate she may need  a porlonged episode  due to fascial tightness and post op swelling. Her shoulder continues to be stiff     Rehab Potential  Excellent    PT Frequency  2x / week    PT Duration  4 weeks    PT Treatment/Interventions  ADLs/Self Care Home Management;Therapeutic exercise;Patient/family education;Orthotic Fit/Training;Manual techniques;Passive range of motion;Manual lymph drainage;Scar mobilization;DME Instruction    PT Next Visit Plan  assess if pt got relief from TG soft and compression in binder, P/A/AAROM left shoulde; if she will tolerate, progress to pulleys, ball up wall, etc    PT Home Exercise Plan  post-op shoulder ROM exercises once okayed by surgeon, to 90 degrees only for one week, supine dowel flexion     Consulted and Agree with Plan of Care  Patient       Patient will benefit from skilled therapeutic intervention in order to improve the following deficits and impairments:  Postural dysfunction, Decreased knowledge of precautions, Decreased knowledge of use of DME, Impaired UE functional use, Decreased range of motion, Decreased strength, Increased fascial restricitons, Decreased scar mobility  Visit Diagnosis: Aftercare following surgery for neoplasm  Stiffness of left shoulder, not elsewhere classified  Abnormal posture     Problem List Patient Active Problem List   Diagnosis Date Noted  . Stage II breast cancer, left ([place]) 02/23/2017  . Carcinoma of upper-outer quadrant of left breast in female, estrogen receptor positive ([place]) 02/10/2017  . Carcinoma of upper-inner quadrant of left breast in female, estrogen receptor positive ([place]) 02/10/2017  . Chest pain 07/18/2015  . Hypokalemia 07/18/2015  . Vertigo 07/18/2015  . LBBB (left bundle branch block) 05/28/2009  . AODM 12/21/2007  . Essential hypertension, benign 12/21/2007  . DIASTOLIC DYSFUNCTION 73/41/9379  . FEMORAL BRUIT, RIGHT 12/21/2007  . DYSPNEA 12/21/2007   Donato Heinz. Owens Shark PT  Norwood Levo 04/06/2017, 3:41 PM  [place], [place], [address] Phone: [phone]   Fax:  [phone]  Name: CLETIS MUMA MRN: [id number] Date of Birth: [birth year]

## 2017-04-06 NOTE — Progress Notes (Signed)
[facility]  [phone] [phone]  Clinic Follow up Note   Patient Care Team: Sasser, Paul W, MD as PCP - General (Cardiology) Feng, Yan, MD as Consulting Physician (Hematology) Hoxworth, Benjamin, MD as Consulting Physician (General Surgery) Burton, Lacie K, NP as Nurse Practitioner (Nurse Practitioner) Koneswaran, Suresh A, MD as Attending Physician (Cardiology)   Date of Service:  04/07/2017  SUMMARY OF ONCOLOGIC HISTORY: Cancer Staging Carcinoma of upper-inner quadrant of left breast in female, estrogen receptor positive (HCC) Staging form: Breast, AJCC 8th Edition - Clinical stage from 01/18/2017: Stage IB (cT2, cN0, cM0, G3, ER: Positive, PR: Positive, HER2: Positive) - Signed by Feng, Yan, MD on 02/10/2017 - Pathologic stage from 02/23/2017: Stage IB (pT3, pN0, cM0, G3, ER: Positive, PR: Positive, HER2: Positive) - Signed by Burton, Lacie K, NP on 03/16/2017  Carcinoma of upper-outer quadrant of left breast in female, estrogen receptor positive (HCC) Staging form: Breast, AJCC 8th Edition - Clinical stage from 01/18/2017: Stage IB (cT2, cN0, cM0, G2, ER: Positive, PR: Positive, HER2: Negative) - Unsigned - Pathologic stage from 02/23/2017: Stage IB (pT2, pN0(sn), cM0, G3, ER: Positive, PR: Positive, HER2: Negative) - Signed by Burton, Lacie K, NP on 03/16/2017    Carcinoma of upper-outer quadrant of left breast in female, estrogen receptor positive (HCC)   01/13/2017 Mammogram    FINDINGS: In the left breast, a spiculated mass lies in the upper outer quadrant. In the medial posterior left breast, there is a larger lobulated mass. These masses correspond to the palpable abnormalities. No other discrete left breast masses.  In the right breast, there are no discrete masses. There are no areas of architectural distortion.  In both breasts there are scattered calcifications, rounded punctate, with a few other larger coarse dystrophic  calcifications, without significant change from the previous screening mammogram, which is dated 12/04/2009.  Mammographic images were processed with CAD.  On physical exam, there is a firm palpable mass in the upper outer left breast and another firm mass in the medial left breast.       01/13/2017 Breast US    Targeted ultrasound is performed, showing an irregular hypoechoic shadowing mass in the left breast at the 2:30 o'clock position, 5 cm the nipple, measuring 3.3 x 2.8 x 2.6 cm. In the 10:30 o'clock position of the left breast, 5 cm the nipple, there is irregular hypoechoic mass with somewhat more lobulated margins, corresponding to the lobulated mass seen mammographically. This measures 4.8 x 2.9 x 4.8 cm. Both masses show internal vascularity on color Doppler analysis.  Sonographic evaluation of the left axilla shows several nodes with thickened cortices. Status cortex measured is 5 mm. None of these lymph nodes appear enlarged but overall size criteria.  IMPRESSION: 1. Two masses in the left breast, 1 at the 2:30 o'clock position in the other at the 10:30 o'clock position, both highly suspicious for breast carcinoma. 2. Several abnormal left axillary lymph nodes with thickened cortices. 3. No evidence of right breast malignancy.       01/18/2017 Pathology Results    Diagnosis 1. Breast, left, needle core biopsy, 2:30 o'clock - INVASIVE MAMMARY CARCINOMA - MAMMARY CARCINOMA IN-SITU - SEE COMMENT 2. Lymph node, needle/core biopsy, left axilla - NO CARCINOMA IDENTIFIED - SEE COMMENT 3. Breast, left, needle core biopsy, 10:30 o'clock - INVASIVE DUCTAL CARCINOMA - SEE COMMENT  1. PROGNOSTIC INDICATORS Results: IMMUNOHISTOCHEMICAL AND MORPHOMETRIC ANALYSIS PERFORMED MANUALLY Estrogen Receptor: 100%, POSITIVE, STRONG STAINING INTENSITY Progesterone Receptor: 60%, POSITIVE, STRONG   STAINING INTENSITY Proliferation Marker Ki67: 60%  1. FLUORESCENCE IN-SITU  HYBRIDIZATION Results: HER2 - NEGATIVE RATIO OF HER2/CEP17 SIGNALS 1.28 AVERAGE HER2 COPY NUMBER PER CELL 2.75  3. PROGNOSTIC INDICATORS Results: IMMUNOHISTOCHEMICAL AND MORPHOMETRIC ANALYSIS PERFORMED MANUALLY Estrogen Receptor: 100%, POSITIVE, STRONG STAINING INTENSITY Progesterone Receptor: 50%, POSITIVE, STRONG STAINING INTENSITY Proliferation Marker Ki67: 70%  3. FLUORESCENCE IN-SITU HYBRIDIZATION Results: HER2 - **POSITIVE** RATIO OF HER2/CEP17 SIGNALS 2.80 AVERAGE HER2 COPY NUMBER PER CELL 6.15  Microscopic Comment 1. The biopsy material shows an infiltrative proliferation of cells with large vesicular nuclei with inconspicuous nucleoli, arranged linearly and in small clusters. Based on the biopsy, the carcinoma appears Nottingham grade 2 of 3 and measures 0.9 cm in greatest linear extent. E-cadherin and prognostic markers (ER/PR/ki-67/HER2-FISH)are pending and will be reported in an addendum. Dr. Kish reviewed the case and agrees with the above diagnosis.      02/10/2017 Initial Diagnosis    Carcinoma of upper-outer quadrant of left breast in female, estrogen receptor positive (HCC)      02/19/2017 Imaging    Bone Scan whole Body 02/19/17 IMPRESSION: Negative for evidence of bony metastatic disease.      02/19/2017 Imaging    CT CAP W Contrast 02/19/17 IMPRESSION: 1. Small right middle lobe pulmonary nodules up to 0.8 cm, nonspecific. Non-contrast chest CT at 3-6 months is recommended. If the nodules are stable at time of repeat CT, then future CT at 18-24 months (from today's scan) is considered optional for low-risk patients, but is recommended for high-risk patients. This recommendation follows the consensus statement: Guidelines for Management of Incidental Pulmonary Nodules Detected on CT Images: From the Fleischner Society 2017; Radiology 2017; 284:228-243. 2. Subcentimeter hepatic lesions likely cysts but incompletely characterized due to small size. Attention  on follow-up imaging recommended. 3. 2.1 cm fusiform right common iliac artery aneurysm. Continued surveillance recommended. 4. Aortic Atherosclerosis (ICD10-I70.0) and Emphysema (ICD10-J43.9).      02/23/2017 Pathology Results    Diagnosis 1. Breast, simple mastectomy, Left Total - INVASIVE DUCTAL CARCINOMA, MULTIFOCAL, NOTTINGHAM GRADE 3/3 (5.3 CM, 3.5 CM) - INVASIVE CARCINOMA INVOLVES THE DERMIS - DUCTAL CARCINOMA IN SITU, INTERMEDIATE GRADE - HYALINIZED FIBROADENOMA - MARGINS UNINVOLVED BY CARCINOMA - NO CARCINOMA IDENTIFIED IN TWO LYMPH NODES (0/2) - SEE ONCOLOGY TABLE AND COMMENT BELOW 2. Lymph node, sentinel, biopsy, Left Axillary - NO CARCINOMA IDENTIFIED IN ONE LYMPH NODE (0/1) 3. Lymph node, sentinel, biopsy - NO CARCINOMA IDENTIFIED IN ONE LYMPH NODE (0/1) 4. Lymph node, sentinel, biopsy - NO CARCINOMA IDENTIFIED IN ONE LYMPH NODE (0/1) 5. Lymph node, sentinel, biopsy - NO CARCINOMA IDENTIFIED IN ONE LYMPH NODE (0/1) Microscopic Comment 1. BREAST, INVASIVE TUMOR Procedure: Simple mastectomy Laterality: Left Tumor Size: 5. 3 cm, 3.5 cm Histologic Type: Invasive carcinoma of no special type (ductal, not otherwise specified) Grade: Nottingham Grade 3 Tubular Differentiation: 3 Nuclear Pleomorphism: 3 Mitotic Count: 2 Ductal Carcinoma in Situ (DCIS): Present Extent of Tumor: Skin: Invasive carcinoma directly invades into the dermis or epidermis without skin ulceration Margins: Invasive carcinoma, distance from closest margin: 1.5 cm (posterior) DCIS, distance from closest margin: > 1 cm Regional Lymph Nodes: Number of Lymph Nodes Examined: 6 Number of Sentinel Lymph Nodes Examined: 4 Lymph Nodes with Macrometastases: 0 Lymph Nodes with Micrometastases: 0 Lymph Nodes with Isolated Tumor Cells: 0 Treatment effect: No known presurgical therapy Breast Prognostic Profile: See Also ([id number]) Estrogen Receptor: Positive (100%, strong); Positive (100%,  strong) Progesterone Receptor: Positive (50%, strong); Positive (50%, strong) Her2: Positive (Ratio 2.80); Negative (  Ratio 1.28) Ki-67: 70%; 60% Best tumor block for sendout testing: 1E Pathologic Stage Classification (pTNM, AJCC 8th Edition): Primary Tumor: mpT3 Regional Lymph Nodes: pN0 COMMENT: The two invasive carcinomas in the breast have slightly different morphologies. The larger lesion has a papillary and micropapillary pattern admixed with typical ductal carcinoma while the smaller lesion is more typical of a ductal lesion with linear arrays (E-cadherin performed on biopsy). There are foci within the larger lesion that are concerning for lymphovascular space invasion.       04/07/2017 -  Chemotherapy    weekly taxol x12 weeks beginnign 04/07/17 then herceptin q3 weeks for total 1 year        CURRENT THERAPY:  weekly taxol x12 weeks beginning 04/07/17 then herceptin q3 weeks for total 1 year    INTERVAL HISTORY:  Ms. Penny Huynh returns for follow up and to start her weekly Taxol. She presents to the clinic today accompanied by her husband. She notes she finally had her draining tube was removed on 04/02/17. She has recovered well from her surgery. She has compression sleeve and wrap to help prevent lymphedema. She did pick up her EMLA cream and antiemetics. Pt is ready to start her chemo today.     REVIEW OF SYSTEMS:   Constitutional: Denies fatigue, fevers, chills or abnormal weight loss Eyes: Denies blurriness of vision Ears, nose, mouth, throat, and face: Denies mucositis or sore throat Respiratory: Denies cough, dyspnea or wheezes Cardiovascular: Denies palpitation, chest discomfort or lower extremity swelling Gastrointestinal:  Denies nausea, vomiting, constipation, diarrhea, heartburn or change in bowel habits Skin: negative Lymphatics: Denies new lymphadenopathy or easy bruising Neurological:Denies tingling or new weaknesses   Behavioral/Psych: Mood is stable, no new changes   Breast: (+) s/p left mastectomy  All other systems were reviewed with the patient and are negative.  MEDICAL HISTORY:  Past Medical History:  Diagnosis Date  . Arthritis    "maybe a little in my left knee" (02/23/2017)  . Asthmatic bronchitis   . Cancer of left breast ([place])   . Coronary atherosclerosis    Minor nonobstructive CAD at cardiac catheterization 2009  . Dyslipidemia   . Femoral bruit 7/08   With normal ABIs  . Hypertension   . Lower extremity edema    Chronic  . Pneumonia ~ 1950   in 8th grade  . Type II diabetes mellitus ([place])     SURGICAL HISTORY: Past Surgical History:  Procedure Laterality Date  . APPENDECTOMY    . BREAST BIOPSY Left 02/2017  . CARDIAC CATHETERIZATION     "long time ago" (02/23/2017)  . MASTECTOMY COMPLETE / SIMPLE W/ SENTINEL NODE BIOPSY Left 02/23/2017   TOTAL MATECTOMY;  LEFT AXILLARY SENTINEL LYMPH NODE BIOPSY ERAS PATHWAY  . MASTECTOMY WITH RADIOACTIVE SEED GUIDED EXCISION AND AXILLARY SENTINEL LYMPH NODE BIOPSY Left 02/23/2017   Procedure: LEFT TOTAL MATECTOMY;  LEFT AXILLARY SENTINEL LYMPH NODE BIOPSY ERAS PATHWAY;  Surgeon: Excell Seltzer, MD;  Location: [facility];  Service: General;  Laterality: Left;  . PILONIDAL CYST EXCISION    . PORTA CATH INSERTION  02/23/2017  . PORTACATH PLACEMENT N/A 02/23/2017   Procedure: INSERTION PORT-A-CATH;  Surgeon: Excell Seltzer, MD;  Location: [facility];  Service: General;  Laterality: N/A;  . SHOULDER ARTHROSCOPY W/ ROTATOR CUFF REPAIR Right     I have reviewed the social history and family history with the patient and they are unchanged from previous note.  ALLERGIES:  has No Known Allergies.  MEDICATIONS:  Current Outpatient Medications  Medication Sig Dispense Refill  . aspirin 81 MG EC tablet Take 81 mg by mouth at bedtime.     . chlorthalidone (HYGROTON) 25 MG tablet Take 25 mg by mouth daily.     . Coenzyme Q10 (COQ10) 100 MG CAPS Take 100 mg by mouth daily.      Marland Kitchen latanoprost (XALATAN)  0.005 % ophthalmic solution Place 1 drop into both eyes at bedtime.    . lidocaine-prilocaine (EMLA) cream Apply to affected area once 30 g 3  . losartan (COZAAR) 50 MG tablet Take 50 mg by mouth daily.    . metFORMIN (GLUCOPHAGE) 500 MG tablet Take 1,000 mg by mouth daily with breakfast.     . ondansetron (ZOFRAN) 8 MG tablet Take 1 tablet (8 mg total) by mouth 2 (two) times daily as needed (Nausea or vomiting). 30 tablet 1  . prochlorperazine (COMPAZINE) 10 MG tablet Take 1 tablet (10 mg total) by mouth every 6 (six) hours as needed (Nausea or vomiting). 30 tablet 1  . traMADol (ULTRAM) 50 MG tablet Take 1 tablet (50 mg total) by mouth every 6 (six) hours as needed (mild pain). 15 tablet 0  . vitamin B-12 (CYANOCOBALAMIN) 1000 MCG tablet Take 1,000 mcg by mouth daily.     No current facility-administered medications for this visit.     PHYSICAL EXAMINATION: ECOG PERFORMANCE STATUS: 1 - Symptomatic but completely ambulatory  Vitals:   04/07/17 0921  BP: (!) 163/66  Pulse: 64  Resp: 18  Temp: 98.7 F (37.1 C)  SpO2: 100%   Filed Weights   04/07/17 0921  Weight: 155 lb 14.4 oz (70.7 kg)    GENERAL:alert, no distress and comfortable SKIN: skin color, texture, turgor are normal   EYES: normal, Conjunctiva are pink and non-injected, sclera clear OROPHARYNX:no exudate, no erythema and lips, buccal mucosa, and tongue normal  NECK: supple, thyroid normal size, non-tender, without nodularity LYMPH:  no palpable cervical, supraclavicular, or axillary lymphadenopathy  LUNGS: clear to auscultation bilaterally with normal breathing effort HEART: regular rate & rhythm and no murmurs and no lower extremity edema ABDOMEN:abdomen soft, non-tender and normal bowel sounds Musculoskeletal:no cyanosis of digits and no clubbing  NEURO: alert & oriented x 3 with fluent speech, no focal motor/sensory deficits BREASTS: s/p left mastectomy, surgical incision healing well PAC without  erythema  LABORATORY DATA:  I have reviewed the data as listed CBC Latest Ref Rng & Units 04/07/2017 02/24/2017 02/24/2017  WBC 3.9 - 10.3 K/uL 8.6 - 4.7  Hemoglobin 12.0 - 15.0 g/dL - 10.3(L) 6.7(LL)  Hematocrit 34.8 - 46.6 % 34.6(L) 31.1(L) 20.9(L)  Platelets 145 - 400 K/uL 266 - 133(L)     CMP Latest Ref Rng & Units 04/07/2017 02/24/2017 02/18/2017  Glucose 70 - 140 mg/dL 84 203(H) 103(H)  BUN 7 - 26 mg/dL _0 Creatinine 0.60 - 1.10 mg/dL 0.75 0.80 0.72  Sodium 136 - 145 mmol/L 132(L) 132(L) 130(L)  Potassium 3.5 - 5.1 mmol/L 3.4(L) 3.7 3.5  Chloride 98 - 109 mmol/L 95(L) 97(L) 94(L)  CO2 22 - 29 mmol/L _1 Calcium 8.4 - 10.4 mg/dL 9.5 8.6(L) 9.4  Total Protein 6.4 - 8.3 g/dL 6.5 - -  Total Bilirubin 0.2 - 1.2 mg/dL 0.5 - -  Alkaline Phos 40 - 150 U/L 50 - -  AST 5 - 34 U/L 12 - -  ALT 0 - 55 U/L 12 - -   PATHOLOGY  LEFT SIMPLE MASTECTOMY WITH SNLB 02/23/17 per Dr.  Hoxworth Diagnosis 1. Breast, simple mastectomy, Left Total - INVASIVE DUCTAL CARCINOMA, MULTIFOCAL, NOTTINGHAM GRADE 3/3 (5.3 CM, 3.5 CM) - INVASIVE CARCINOMA INVOLVES THE DERMIS - DUCTAL CARCINOMA IN SITU, INTERMEDIATE GRADE - HYALINIZED FIBROADENOMA - MARGINS UNINVOLVED BY CARCINOMA - NO CARCINOMA IDENTIFIED IN TWO LYMPH NODES (0/2) - SEE ONCOLOGY TABLE AND COMMENT BELOW 2. Lymph node, sentinel, biopsy, Left Axillary - NO CARCINOMA IDENTIFIED IN ONE LYMPH NODE (0/1) 3. Lymph node, sentinel, biopsy - NO CARCINOMA IDENTIFIED IN ONE LYMPH NODE (0/1) 4. Lymph node, sentinel, biopsy - NO CARCINOMA IDENTIFIED IN ONE LYMPH NODE (0/1) 5. Lymph node, sentinel, biopsy - NO CARCINOMA IDENTIFIED IN ONE LYMPH NODE (0/1) Microscopic Comment 1. BREAST, INVASIVE TUMOR Procedure: Simple mastectomy Laterality: Left Tumor Size: 5. 3 cm, 3.5 cm Histologic Type: Invasive carcinoma of no special type (ductal, not otherwise specified) Grade: Nottingham Grade 3 Tubular Differentiation: 3 Nuclear Pleomorphism:  3 Mitotic Count: 2 Ductal Carcinoma in Situ (DCIS): Present Extent of Tumor: Skin: Invasive carcinoma directly invades into the dermis or epidermis without skin ulceration Margins: Invasive carcinoma, distance from closest margin: 1.5 cm (posterior) DCIS, distance from closest margin: > 1 cm Regional Lymph Nodes: Number of Lymph Nodes Examined: 6 Number of Sentinel Lymph Nodes Examined: 4 Lymph Nodes with Macrometastases: 0 Lymph Nodes with Micrometastases: 0 Lymph Nodes with Isolated Tumor Cells: 0 Treatment effect: No known presurgical therapy Breast Prognostic Profile: See Also ([id number]) Estrogen Receptor: Positive (100%, strong); Positive (100%, strong) Progesterone Receptor: Positive (50%, strong); Positive (50%, strong) Her2: Positive (Ratio 2.80); Negative (Ratio 1.28) Ki-67: 70%; 60% Best tumor block for sendout testing: 1E Pathologic Stage Classification (pTNM, AJCC 8th Edition): Primary Tumor: mpT3 Regional Lymph Nodes: pN0 COMMENT: The two invasive carcinomas in the breast have slightly different morphologies. The larger lesion has a papillary and micropapillary pattern admixed with typical ductal carcinoma while the smaller lesion is more typical of a ductal lesion with linear arrays (E-cadherin performed on biopsy). There are foci within the larger lesion that are concerning for lymphovascular space invasion.   02/17/17 ECHO: Study Conclusions - Left ventricle: The cavity size was normal. Wall thickness was   increased in a pattern of mild LVH. Systolic function was normal.   The estimated ejection fraction was in the range of 55% to 60%.   Normal GLS at -20%. Wall motion was normal; there were no   regional wall motion abnormalities. Doppler parameters are   consistent with abnormal left ventricular relaxation (grade 1   diastolic dysfunction). The E/e&' ratio is between 8-15,   suggesting indeterminate LV filling pressure. - Aortic valve: Sclerosis without  stenosis. - Aorta: Mildly dilated aorta. Aortic root dimension: 39 mm (ED).   Ascending aortic diameter: 38 mm (S). - Left atrium: The atrium was normal in size. - Inferior vena cava: The vessel was normal in size. The   respirophasic diameter changes were in the normal range (>= 50%),   consistent with normal central venous pressure. Impressions: - Compared to a prior study in 2013, the LVEF is slightly lower but   normal at 55-60%. There is mild LVH, normal GLS at -20%, grade 1   DD and indeterminate LV filling pressure. The aorta is mildly   dilated at 3.8-3.9 cm.   RADIOGRAPHIC STUDIES: I have personally reviewed the radiological images as listed and agreed with the findings in the report. No results found.    Bone Scan whole Body 02/19/17 IMPRESSION: Negative for evidence of bony metastatic disease.   CT CAP   W Contrast 02/19/17 IMPRESSION: 1. Small right middle lobe pulmonary nodules up to 0.8 cm, nonspecific. Non-contrast chest CT at 3-6 months is recommended. If the nodules are stable at time of repeat CT, then future CT at 18-24 months (from today's scan) is considered optional for low-risk patients, but is recommended for high-risk patients. This recommendation follows the consensus statement: Guidelines for Management of Incidental Pulmonary Nodules Detected on CT Images: From the Fleischner Society 2017; Radiology 2017; 284:228-243. 2. Subcentimeter hepatic lesions likely cysts but incompletely characterized due to small size. Attention on follow-up imaging recommended. 3. 2.1 cm fusiform right common iliac artery aneurysm. Continued surveillance recommended. 4. Aortic Atherosclerosis (ICD10-I70.0) and Emphysema (ICD10-J43.9).   ASSESSMENT & PLAN: Dorna Campoli is an 83-year-old with past medical history positive for HTN, DM, CAD now with newly diagnosed left breast cancer  1.  Multifocal invasive ductal carcinoma of the left breast of female; carcinoma of the  upper-outer quadrant of left breast and female, ER/PR positive, HER-2 negative pT2N0M0, G2, Stage 1B; IDC of the upper-inner quadrant left breast, ER+/PR+/HER2+, pT3N0M0 G3, stage IB -Due to her HER2 positive disease, obtained a CT CAP and bone scan to complete staging work up on 02/19/17 which was negative for metastasis.  -Pt has recovered well from 02/23/17 left mastectomy.  -PAC placed during her breast surgery.  -We previously reviewed her pathology results in detail, she had multifocal invasive ductal carcinoma, G3, the upper inner quadrant focus spanning 5.3 cm, ER+/PR+/HER2+; the upper outer quadrant focus spanned 3.5 cm, ER+/PR+/HER2- node-negative.  -We discussed the high risk of recurrence due to HER-2 positive T3 disease, I recommend her to consider adjuvant chemotherapy.  Due to her advanced age, will recommend weekly Taxol and Herceptin, followed by Herceptin maintenance therapy for a total of 1 year.  -she had echo 02/17/17 which shows normal EF 55-60% with mild LVH and grade 1 DD; I sent message to her cardiologist to follow while on herceptin; will repeat echo q3 months while on therapy. -Draining tube removed 04/02/17. She has recovered well from surgery and has started rehab.  -I recommend her to consider dipping her hands and feet in cold bag during Taxol infusion to reduce her risk of neuropathy. She opted to try her hands with first cycle treatment. I will notify her infusion nurse.  -Labs reviewed and adequate to proceed with first cycle Taxol.  -Will repeat ECHO in 07/2017.  -F/u in 3 weeks   2. HTN, CAD, DM -continue chlorthalidone and losartan for BP, metformin for DM -We reviewed DM can worsen while on chemotherapy especially with steroid administration; will monitor closely.  -I recommend she eat well and be active within limitations of post-op recovery.   3. Skin rash  -She has red raised pruritic rash confined to her upper chest, appears to be allergic reaction.  Recommended she apply topical steroid cream such as hydrocortisone PRN and continue benadryl spray for itching.  -resolved  PLAN: -Proceed with first cycle Taxol and herceptin today  -Lab, flush and taxol weekly  -F/u with Lacie or me next week and then every 2 weeks.     All questions were answered. The patient knows to call the clinic with any problems, questions or concerns. No barriers to learning was detected. I spent  20 minutes counseling the patient face to face. The total time spent in the appointment was 25 minutes and more than 50% was on counseling and review of test results     Yan Feng, MD 04/07/2017      This document serves as a record of services personally performed by Yan Feng, MD. It was created on her behalf by Amoya Bennett, a trained medical scribe. The creation of this record is based on the scribe's personal observations and the provider's statements to them.    I have reviewed the above documentation for accuracy and completeness, and I agree with the above.   

## 2017-04-07 ENCOUNTER — Encounter: Payer: Self-pay | Admitting: *Deleted

## 2017-04-07 ENCOUNTER — Inpatient Hospital Stay: Payer: Medicare Other

## 2017-04-07 ENCOUNTER — Inpatient Hospital Stay: Payer: Medicare Other | Attending: Nurse Practitioner

## 2017-04-07 ENCOUNTER — Ambulatory Visit: Payer: Medicare Other | Admitting: Hematology

## 2017-04-07 ENCOUNTER — Telehealth: Payer: Self-pay | Admitting: Hematology

## 2017-04-07 ENCOUNTER — Inpatient Hospital Stay (HOSPITAL_BASED_OUTPATIENT_CLINIC_OR_DEPARTMENT_OTHER): Payer: Medicare Other | Admitting: Hematology

## 2017-04-07 VITALS — BP 163/66 | HR 64 | Temp 98.7°F | Resp 18 | Ht 64.5 in | Wt 155.9 lb

## 2017-04-07 VITALS — BP 160/70 | HR 62 | Temp 97.9°F | Resp 16

## 2017-04-07 DIAGNOSIS — C50412 Malignant neoplasm of upper-outer quadrant of left female breast: Secondary | ICD-10-CM

## 2017-04-07 DIAGNOSIS — Z170 Estrogen receptor positive status [ER+]: Principal | ICD-10-CM

## 2017-04-07 DIAGNOSIS — I10 Essential (primary) hypertension: Secondary | ICD-10-CM | POA: Insufficient documentation

## 2017-04-07 DIAGNOSIS — I2510 Atherosclerotic heart disease of native coronary artery without angina pectoris: Secondary | ICD-10-CM | POA: Diagnosis not present

## 2017-04-07 DIAGNOSIS — R918 Other nonspecific abnormal finding of lung field: Secondary | ICD-10-CM | POA: Insufficient documentation

## 2017-04-07 DIAGNOSIS — Z9012 Acquired absence of left breast and nipple: Secondary | ICD-10-CM | POA: Insufficient documentation

## 2017-04-07 DIAGNOSIS — Z95828 Presence of other vascular implants and grafts: Secondary | ICD-10-CM

## 2017-04-07 DIAGNOSIS — Z79899 Other long term (current) drug therapy: Secondary | ICD-10-CM | POA: Insufficient documentation

## 2017-04-07 DIAGNOSIS — R21 Rash and other nonspecific skin eruption: Secondary | ICD-10-CM | POA: Insufficient documentation

## 2017-04-07 DIAGNOSIS — E119 Type 2 diabetes mellitus without complications: Secondary | ICD-10-CM

## 2017-04-07 DIAGNOSIS — E871 Hypo-osmolality and hyponatremia: Secondary | ICD-10-CM | POA: Diagnosis not present

## 2017-04-07 DIAGNOSIS — C50212 Malignant neoplasm of upper-inner quadrant of left female breast: Secondary | ICD-10-CM

## 2017-04-07 DIAGNOSIS — Z5111 Encounter for antineoplastic chemotherapy: Secondary | ICD-10-CM | POA: Insufficient documentation

## 2017-04-07 LAB — CMP (CANCER CENTER ONLY)
ALK PHOS: 50 U/L (ref 40–150)
ALT: 12 U/L (ref 0–55)
AST: 12 U/L (ref 5–34)
Albumin: 3.5 g/dL (ref 3.5–5.0)
Anion gap: 9 (ref 3–11)
BUN: 12 mg/dL (ref 7–26)
CALCIUM: 9.5 mg/dL (ref 8.4–10.4)
CO2: 28 mmol/L (ref 22–29)
CREATININE: 0.75 mg/dL (ref 0.60–1.10)
Chloride: 95 mmol/L — ABNORMAL LOW (ref 98–109)
Glucose, Bld: 84 mg/dL (ref 70–140)
Potassium: 3.4 mmol/L — ABNORMAL LOW (ref 3.5–5.1)
Sodium: 132 mmol/L — ABNORMAL LOW (ref 136–145)
Total Bilirubin: 0.5 mg/dL (ref 0.2–1.2)
Total Protein: 6.5 g/dL (ref 6.4–8.3)

## 2017-04-07 LAB — CBC WITH DIFFERENTIAL (CANCER CENTER ONLY)
Basophils Absolute: 0 10*3/uL (ref 0.0–0.1)
Basophils Relative: 0 %
Eosinophils Absolute: 0.1 10*3/uL (ref 0.0–0.5)
Eosinophils Relative: 1 %
HEMATOCRIT: 34.6 % — AB (ref 34.8–46.6)
HEMOGLOBIN: 11.6 g/dL (ref 11.6–15.9)
LYMPHS ABS: 2.5 10*3/uL (ref 0.9–3.3)
LYMPHS PCT: 30 %
MCH: 30.5 pg (ref 25.1–34.0)
MCHC: 33.5 g/dL (ref 31.5–36.0)
MCV: 90.8 fL (ref 79.5–101.0)
Monocytes Absolute: 0.8 10*3/uL (ref 0.1–0.9)
Monocytes Relative: 9 %
NEUTROS PCT: 60 %
Neutro Abs: 5.1 10*3/uL (ref 1.5–6.5)
Platelet Count: 266 10*3/uL (ref 145–400)
RBC: 3.81 MIL/uL (ref 3.70–5.45)
RDW: 13 % (ref 11.2–14.5)
WBC: 8.6 10*3/uL (ref 3.9–10.3)

## 2017-04-07 MED ORDER — SODIUM CHLORIDE 0.9% FLUSH
10.0000 mL | INTRAVENOUS | Status: DC | PRN
  Filled 2017-04-07: qty 10

## 2017-04-07 MED ORDER — SODIUM CHLORIDE 0.9 % IV SOLN
80.0000 mg/m2 | Freq: Once | INTRAVENOUS | Status: AC
  Administered 2017-04-07: 144 mg via INTRAVENOUS
  Filled 2017-04-07: qty 24

## 2017-04-07 MED ORDER — SODIUM CHLORIDE 0.9 % IV SOLN
20.0000 mg | Freq: Once | INTRAVENOUS | Status: AC
  Administered 2017-04-07: 20 mg via INTRAVENOUS
  Filled 2017-04-07: qty 2

## 2017-04-07 MED ORDER — FAMOTIDINE IN NACL 20-0.9 MG/50ML-% IV SOLN: 20.0000 mg | Freq: Once | INTRAVENOUS | Status: DC

## 2017-04-07 MED ORDER — HEPARIN SOD (PORK) LOCK FLUSH 100 UNIT/ML IV SOLN
500.0000 [IU] | Freq: Once | INTRAVENOUS | Status: AC | PRN
  Administered 2017-04-07: 500 [IU]
  Filled 2017-04-07: qty 5

## 2017-04-07 MED ORDER — DIPHENHYDRAMINE HCL 50 MG/ML IJ SOLN
INTRAMUSCULAR | Status: AC
  Filled 2017-04-07: qty 1

## 2017-04-07 MED ORDER — SODIUM CHLORIDE 0.9 % IV SOLN
Freq: Once | INTRAVENOUS | Status: AC
  Administered 2017-04-07: 11:00:00 via INTRAVENOUS

## 2017-04-07 MED ORDER — SODIUM CHLORIDE 0.9% FLUSH
10.0000 mL | INTRAVENOUS | Status: DC | PRN
  Administered 2017-04-07: 10 mL
  Filled 2017-04-07: qty 10

## 2017-04-07 MED ORDER — DIPHENHYDRAMINE HCL 50 MG/ML IJ SOLN
50.0000 mg | Freq: Once | INTRAMUSCULAR | Status: AC
  Administered 2017-04-07: 50 mg via INTRAVENOUS

## 2017-04-07 MED ORDER — ACETAMINOPHEN 325 MG PO TABS
ORAL_TABLET | ORAL | Status: AC
  Filled 2017-04-07: qty 2

## 2017-04-07 MED ORDER — ACETAMINOPHEN 325 MG PO TABS
650.0000 mg | ORAL_TABLET | Freq: Once | ORAL | Status: AC
  Administered 2017-04-07: 650 mg via ORAL

## 2017-04-07 MED ORDER — TRASTUZUMAB CHEMO 150 MG IV SOLR
300.0000 mg | Freq: Once | INTRAVENOUS | Status: AC
  Administered 2017-04-07: 300 mg via INTRAVENOUS
  Filled 2017-04-07: qty 14.29

## 2017-04-07 NOTE — Patient Instructions (Signed)
[facility] Discharge Instructions for Patients Receiving Chemotherapy  Today you received the following chemotherapy agents trastuzumab (Herceptin) and paclitaxel (Taxol)  To help prevent nausea and vomiting after your treatment, we encourage you to take your nausea medications as directed by your physician.   If you develop nausea and vomiting that is not controlled by your nausea medication, call the clinic.   BELOW ARE SYMPTOMS THAT SHOULD BE REPORTED IMMEDIATELY:  *FEVER GREATER THAN 100.5 F  *CHILLS WITH OR WITHOUT FEVER  NAUSEA AND VOMITING THAT IS NOT CONTROLLED WITH YOUR NAUSEA MEDICATION  *UNUSUAL SHORTNESS OF BREATH  *UNUSUAL BRUISING OR BLEEDING  TENDERNESS IN MOUTH AND THROAT WITH OR WITHOUT PRESENCE OF ULCERS  *URINARY PROBLEMS  *BOWEL PROBLEMS  UNUSUAL RASH Items with * indicate a potential emergency and should be followed up as soon as possible.  Feel free to call the clinic should you have any questions or concerns. The clinic phone number is [phone].  Please show the [facility] at check-in to the Emergency Department and triage nurse.  Trastuzumab injection for infusion What is this medicine? TRASTUZUMAB (tras TOO zoo mab) is a monoclonal antibody. It is used to treat breast cancer and stomach cancer. This medicine may be used for other purposes; ask your health care provider or pharmacist if you have questions. COMMON BRAND NAME(S): Herceptin What should I tell my health care provider before I take this medicine? They need to know if you have any of these conditions: -heart disease -heart failure -lung or breathing disease, like asthma -an unusual or allergic reaction to trastuzumab, benzyl alcohol, or other medications, foods, dyes, or preservatives -pregnant or trying to get pregnant -breast-feeding How should I use this medicine? This drug is given as an infusion into a vein. It is administered in a hospital or clinic by  a specially trained health care professional. Talk to your pediatrician regarding the use of this medicine in children. This medicine is not approved for use in children. Overdosage: If you think you have taken too much of this medicine contact a poison control center or emergency room at once. NOTE: This medicine is only for you. Do not share this medicine with others. What if I miss a dose? It is important not to miss a dose. Call your doctor or health care professional if you are unable to keep an appointment. What may interact with this medicine? This medicine may interact with the following medications: -certain types of chemotherapy, such as daunorubicin, doxorubicin, epirubicin, and idarubicin This list may not describe all possible interactions. Give your health care provider a list of all the medicines, herbs, non-prescription drugs, or dietary supplements you use. Also tell them if you smoke, drink alcohol, or use illegal drugs. Some items may interact with your medicine. What should I watch for while using this medicine? Visit your doctor for checks on your progress. Report any side effects. Continue your course of treatment even though you feel ill unless your doctor tells you to stop. Call your doctor or health care professional for advice if you get a fever, chills or sore throat, or other symptoms of a cold or flu. Do not treat yourself. Try to avoid being around people who are sick. You may experience fever, chills and shaking during your first infusion. These effects are usually mild and can be treated with other medicines. Report any side effects during the infusion to your health care professional. Fever and chills usually do not happen with  later infusions. Do not become pregnant while taking this medicine or for 7 months after stopping it. Women should inform their doctor if they wish to become pregnant or think they might be pregnant. Women of child-bearing potential will need to  have a negative pregnancy test before starting this medicine. There is a potential for serious side effects to an unborn child. Talk to your health care professional or pharmacist for more information. Do not breast-feed an infant while taking this medicine or for 7 months after stopping it. Women must use effective birth control with this medicine. What side effects may I notice from receiving this medicine? Side effects that you should report to your doctor or health care professional as soon as possible: -allergic reactions like skin rash, itching or hives, swelling of the face, lips, or tongue -chest pain or palpitations -cough -dizziness -feeling faint or lightheaded, falls -fever -general ill feeling or flu-like symptoms -signs of worsening heart failure like breathing problems; swelling in your legs and feet -unusually weak or tired Side effects that usually do not require medical attention (report to your doctor or health care professional if they continue or are bothersome): -bone pain -changes in taste -diarrhea -joint pain -nausea/vomiting -weight loss This list may not describe all possible side effects. Call your doctor for medical advice about side effects. You may report side effects to FDA at 1-800-FDA-1088. Where should I keep my medicine? This drug is given in a hospital or clinic and will not be stored at home. NOTE: This sheet is a summary. It may not cover all possible information. If you have questions about this medicine, talk to your doctor, pharmacist, or health care provider.  2018 Elsevier/Gold Standard (2016-01-14 14:37:52)  Paclitaxel injection What is this medicine? PACLITAXEL (PAK li TAX el) is a chemotherapy drug. It targets fast dividing cells, like cancer cells, and causes these cells to die. This medicine is used to treat ovarian cancer, breast cancer, and other cancers. This medicine may be used for other purposes; ask your health care provider or  pharmacist if you have questions. COMMON BRAND NAME(S): Onxol, Taxol What should I tell my health care provider before I take this medicine? They need to know if you have any of these conditions: -blood disorders -irregular heartbeat -infection (especially a virus infection such as chickenpox, cold sores, or herpes) -liver disease -previous or ongoing radiation therapy -an unusual or allergic reaction to paclitaxel, alcohol, polyoxyethylated castor oil, other chemotherapy agents, other medicines, foods, dyes, or preservatives -pregnant or trying to get pregnant -breast-feeding How should I use this medicine? This drug is given as an infusion into a vein. It is administered in a hospital or clinic by a specially trained health care professional. Talk to your pediatrician regarding the use of this medicine in children. Special care may be needed. Overdosage: If you think you have taken too much of this medicine contact a poison control center or emergency room at once. NOTE: This medicine is only for you. Do not share this medicine with others. What if I miss a dose? It is important not to miss your dose. Call your doctor or health care professional if you are unable to keep an appointment. What may interact with this medicine? Do not take this medicine with any of the following medications: -disulfiram -metronidazole This medicine may also interact with the following medications: -cyclosporine -diazepam -ketoconazole -medicines to increase blood counts like filgrastim, pegfilgrastim, sargramostim -other chemotherapy drugs like cisplatin, doxorubicin, epirubicin, etoposide, teniposide, vincristine -  quinidine -testosterone -vaccines -verapamil Talk to your doctor or health care professional before taking any of these medicines: -acetaminophen -aspirin -ibuprofen -ketoprofen -naproxen This list may not describe all possible interactions. Give your health care provider a list of all  the medicines, herbs, non-prescription drugs, or dietary supplements you use. Also tell them if you smoke, drink alcohol, or use illegal drugs. Some items may interact with your medicine. What should I watch for while using this medicine? Your condition will be monitored carefully while you are receiving this medicine. You will need important blood work done while you are taking this medicine. This medicine can cause serious allergic reactions. To reduce your risk you will need to take other medicine(s) before treatment with this medicine. If you experience allergic reactions like skin rash, itching or hives, swelling of the face, lips, or tongue, tell your doctor or health care professional right away. In some cases, you may be given additional medicines to help with side effects. Follow all directions for their use. This drug may make you feel generally unwell. This is not uncommon, as chemotherapy can affect healthy cells as well as cancer cells. Report any side effects. Continue your course of treatment even though you feel ill unless your doctor tells you to stop. Call your doctor or health care professional for advice if you get a fever, chills or sore throat, or other symptoms of a cold or flu. Do not treat yourself. This drug decreases your body's ability to fight infections. Try to avoid being around people who are sick. This medicine may increase your risk to bruise or bleed. Call your doctor or health care professional if you notice any unusual bleeding. Be careful brushing and flossing your teeth or using a toothpick because you may get an infection or bleed more easily. If you have any dental work done, tell your dentist you are receiving this medicine. Avoid taking products that contain aspirin, acetaminophen, ibuprofen, naproxen, or ketoprofen unless instructed by your doctor. These medicines may hide a fever. Do not become pregnant while taking this medicine. Women should inform their  doctor if they wish to become pregnant or think they might be pregnant. There is a potential for serious side effects to an unborn child. Talk to your health care professional or pharmacist for more information. Do not breast-feed an infant while taking this medicine. Men are advised not to father a child while receiving this medicine. This product may contain alcohol. Ask your pharmacist or healthcare provider if this medicine contains alcohol. Be sure to tell all healthcare providers you are taking this medicine. Certain medicines, like metronidazole and disulfiram, can cause an unpleasant reaction when taken with alcohol. The reaction includes flushing, headache, nausea, vomiting, sweating, and increased thirst. The reaction can last from 30 minutes to several hours. What side effects may I notice from receiving this medicine? Side effects that you should report to your doctor or health care professional as soon as possible: -allergic reactions like skin rash, itching or hives, swelling of the face, lips, or tongue -low blood counts - This drug may decrease the number of white blood cells, red blood cells and platelets. You may be at increased risk for infections and bleeding. -signs of infection - fever or chills, cough, sore throat, pain or difficulty passing urine -signs of decreased platelets or bleeding - bruising, pinpoint red spots on the skin, black, tarry stools, nosebleeds -signs of decreased red blood cells - unusually weak or tired, fainting spells, lightheadedness -breathing  problems -chest pain -high or low blood pressure -mouth sores -nausea and vomiting -pain, swelling, redness or irritation at the injection site -pain, tingling, numbness in the hands or feet -slow or irregular heartbeat -swelling of the ankle, feet, hands Side effects that usually do not require medical attention (report to your doctor or health care professional if they continue or are bothersome): -bone  pain -complete hair loss including hair on your head, underarms, pubic hair, eyebrows, and eyelashes -changes in the color of fingernails -diarrhea -loosening of the fingernails -loss of appetite -muscle or joint pain -red flush to skin -sweating This list may not describe all possible side effects. Call your doctor for medical advice about side effects. You may report side effects to FDA at 1-800-FDA-1088. Where should I keep my medicine? This drug is given in a hospital or clinic and will not be stored at home. NOTE: This sheet is a summary. It may not cover all possible information. If you have questions about this medicine, talk to your doctor, pharmacist, or health care provider.  2018 Elsevier/Gold Standard (2014-11-20 19:58:00)

## 2017-04-07 NOTE — Patient Instructions (Signed)

## 2017-04-07 NOTE — Telephone Encounter (Signed)
Scheduled appt per 3/6 los - Gave patient AVS and calender per los.  

## 2017-04-08 ENCOUNTER — Ambulatory Visit: Payer: Medicare Other

## 2017-04-08 ENCOUNTER — Encounter: Payer: Self-pay | Admitting: Hematology

## 2017-04-08 DIAGNOSIS — M25612 Stiffness of left shoulder, not elsewhere classified: Secondary | ICD-10-CM | POA: Diagnosis not present

## 2017-04-08 DIAGNOSIS — Z483 Aftercare following surgery for neoplasm: Secondary | ICD-10-CM | POA: Diagnosis not present

## 2017-04-08 DIAGNOSIS — C50912 Malignant neoplasm of unspecified site of left female breast: Secondary | ICD-10-CM | POA: Diagnosis not present

## 2017-04-08 DIAGNOSIS — R293 Abnormal posture: Secondary | ICD-10-CM

## 2017-04-08 DIAGNOSIS — Z170 Estrogen receptor positive status [ER+]: Secondary | ICD-10-CM | POA: Diagnosis not present

## 2017-04-08 NOTE — Therapy (Signed)
[place], [place], [address] Phone: [phone]   Fax:  [phone]  Physical Therapy Treatment  Patient Details  Name: Penny Huynh MRN: [id number] Date of Birth: 06/09/1933 Referring Provider: [name]   Encounter Date: 04/08/2017  PT End of Session - 04/08/17 1031    Visit Number  4    Number of Visits  10    Date for PT Re-Evaluation  04/15/17    PT Start Time  0938    PT Stop Time  1028    PT Time Calculation (min)  50 min    Activity Tolerance  Patient tolerated treatment well    Behavior During Therapy  [facility] for tasks assessed/performed       Past Medical History:  Diagnosis Date  . Arthritis    "maybe a little in my left knee" (02/23/2017)  . Asthmatic bronchitis   . Cancer of left breast ([place])   . Coronary atherosclerosis    Minor nonobstructive CAD at cardiac catheterization 2009  . Dyslipidemia   . Femoral bruit 7/08   With normal ABIs  . Hypertension   . Lower extremity edema    Chronic  . Pneumonia ~ 1950   in 8th grade  . Type II diabetes mellitus ([place])     Past Surgical History:  Procedure Laterality Date  . APPENDECTOMY    . BREAST BIOPSY Left 02/2017  . CARDIAC CATHETERIZATION     "long time ago" (02/23/2017)  . MASTECTOMY COMPLETE / SIMPLE W/ SENTINEL NODE BIOPSY Left 02/23/2017   TOTAL MATECTOMY;  LEFT AXILLARY SENTINEL LYMPH NODE BIOPSY ERAS PATHWAY  . MASTECTOMY WITH RADIOACTIVE SEED GUIDED EXCISION AND AXILLARY SENTINEL LYMPH NODE BIOPSY Left 02/23/2017   Procedure: LEFT TOTAL MATECTOMY;  LEFT AXILLARY SENTINEL LYMPH NODE BIOPSY ERAS PATHWAY;  Surgeon: Excell Seltzer, MD;  Location: [facility];  Service: General;  Laterality: Left;  . PILONIDAL CYST EXCISION    . PORTA CATH INSERTION  02/23/2017  . PORTACATH PLACEMENT N/A 02/23/2017   Procedure: INSERTION PORT-A-CATH;  Surgeon: Excell Seltzer, MD;  Location: [facility];  Service: General;   Laterality: N/A;  . SHOULDER ARTHROSCOPY W/ ROTATOR CUFF REPAIR Right     There were no vitals filed for this visit.  Subjective Assessment - 04/08/17 0939    Subjective  Been wearing the TG soft and it seems to be helping. The chip pack didn't stay well so I couldn't tell if that helped much. Maybe we could tape it? The exercises are going well.     Pertinent History  Patient underwent a left mastectomy and sentinel node biopsy (6 negative nodes) on 02/23/17. She will begin chemotherapy 03/31/17 as long as drain is removed today. She has 2 different breast cancers. Both are ER/PR positive but one is HER2 negative and one is HER2 positive. History of right shoulder scope 20 years ago and has diabetes and hypertension, both of which are well controlled.    Patient Stated Goals  See if my arm is ok    Currently in Pain?  No/denies                      [facility] [place] Adult PT Treatment/Exercise - 04/08/17 0001      Shoulder Exercises: Standing   ABduction  AAROM;Left;5 reps finger ladder; up to #23    ABduction Limitations  Demonstration and VCs to decrease scapular compensation      Shoulder Exercises: Pulleys  Flexion  2 minutes    Flexion Limitations  Demonstration and VCs to decrease Lt scapular compensation    ABduction  2 minutes    ABduction Limitations  Pt returned correct therapist demonstration      Shoulder Exercises: Therapy Ball   Flexion  10 reps With forward lean into end of stretch    Flexion Limitations  Pt required VCs throughout for correct demonstration      Manual Therapy   Manual Therapy  Manual Lymphatic Drainage (MLD);Passive ROM;Myofascial release    Myofascial Release  To Lt axilla and anterior elbow cording, a few pops felt by pt and therapist at anterior elbow, then one in axilla, pt reported feeling looser and cording at anterior elbow was less visible and not as prominent    Manual Lymphatic Drainage (MLD)  In Supine short neck, superficial and deep  abdominals, Rt axilla and Lt inguinal nodes, anterior inter-axillary and Rt axillo-inguinal anastomsis, then focused on Lt anterior chest , shoulder circles and then into Rt S/L for posterior inter-axillary anastomosis and further focus to Lt lateral trunk inferior to axilla    Passive ROM  In Supine into Lt shoulder flexion, abduction and er to pts tolerance                  PT Long Term Goals - 03/18/17 1145      PT LONG TERM GOAL #1   Title  Patient will be independent in her home exercise program for shoulder ROM.    Time  4    Period  Weeks    Status  New      PT LONG TERM GOAL #2   Title  Patient will demonstrate shoulder flexion AROM to be >/= 140 degrees for increased ease reaching overhead.    Baseline  65 degrees    Time  4    Period  Weeks    Status  New      PT LONG TERM GOAL #3   Title  Patient will demonstrate shoulder abduction AROM to be >/= 140 degrees for increased ease reaching overhead.    Baseline  169 degrees    Time  4    Period  Weeks    Status  New      PT LONG TERM GOAL #4   Title  Patient will score </= 10 on the DASH indicating increased shoulder function.    Baseline  54.55    Time  4    Period  Weeks    Status  New      PT LONG TERM GOAL #5   Title  Patient will verbalize she understands lymphedema risk reduction.    Time  4    Period  [facility] Goals - 02/08/17 2024      Patient will be able to verbalize understanding of pertinent lymphedema risk reduction practices relevant to her diagnosis specifically related to skin care.   Status  Achieved      Patient will be able to return demonstrate and/or verbalize understanding of the post-op home exercise program related to regaining shoulder range of motion.   Status  Achieved      Patient will be able to verbalize understanding of the importance of attending the postoperative After Breast Cancer Class for further lymphedema risk reduction education  and therapeutic exercise.   Status  Achieved  Plan - 04/08/17 1031    Clinical Impression Statement  PT tolerated stretching very well today as she did progression to AA/ROM exercise as well. During myofascial release to cording at atnerior elbow multiple small pops were felt at once that pt could feel too, then shortly after one more at axilla, then by end of session her most prominent cord at elbow was less so and pt reported it feeling less tender as well.  Pt has been wearing TG soft and reports it has been feeling good at arm as well as continuing to wear binder.     Rehab Potential  Excellent    PT Frequency  2x / week    PT Duration  4 weeks    PT Treatment/Interventions  ADLs/Self Care Home Management;Therapeutic exercise;Patient/family education;Orthotic Fit/Training;Manual techniques;Passive range of motion;Manual lymph drainage;Scar mobilization;DME Instruction    PT Next Visit Plan  Cont manual therapy to P/A/AAROM left shoulder and myofascial release to areas of cording; cont pulleys, ball up wall, etc    Consulted and Agree with Plan of Care  Patient       Patient will benefit from skilled therapeutic intervention in order to improve the following deficits and impairments:  Postural dysfunction, Decreased knowledge of precautions, Decreased knowledge of use of DME, Impaired UE functional use, Decreased range of motion, Decreased strength, Increased fascial restricitons, Decreased scar mobility  Visit Diagnosis: Aftercare following surgery for neoplasm  Stiffness of left shoulder, not elsewhere classified  Abnormal posture     Problem List Patient Active Problem List   Diagnosis Date Noted  . Stage II breast cancer, left ([place]) 02/23/2017  . Carcinoma of upper-outer quadrant of left breast in female, estrogen receptor positive ([place]) 02/10/2017  . Carcinoma of upper-inner quadrant of left breast in female, estrogen receptor positive ([place]) 02/10/2017  . Chest  pain 07/18/2015  . Hypokalemia 07/18/2015  . Vertigo 07/18/2015  . LBBB (left bundle branch block) 05/28/2009  . AODM 12/21/2007  . Essential hypertension, benign 12/21/2007  . DIASTOLIC DYSFUNCTION 03/88/8280  . FEMORAL BRUIT, RIGHT 12/21/2007  . DYSPNEA 12/21/2007    Otelia Limes, PTA 04/08/2017, 10:35 AM  [place], [place], [address] Phone: [phone]   Fax:  [phone]  Name: DNYLA ANTONETTI MRN: [id number] Date of Birth: [birth year]

## 2017-04-12 ENCOUNTER — Telehealth: Payer: Self-pay | Admitting: *Deleted

## 2017-04-12 NOTE — Telephone Encounter (Signed)
TCT patient following her 1st time chemo of herceptin and taxol last week. Spoke with patient. She states she is doing well, no side effects from chemo noticed. She is aware of her appts on Wednesday of this week.  No questions or concerns.

## 2017-04-12 NOTE — Telephone Encounter (Signed)
-----   Message from Johann Capers, RN sent at 04/07/2017  5:11 PM EST ----- Regarding: Burr Medico - First Time Herceptin/Taxol [name] - chemo f/u call regarding first time herceptin/taxol.

## 2017-04-13 ENCOUNTER — Ambulatory Visit: Payer: Medicare Other

## 2017-04-13 DIAGNOSIS — R293 Abnormal posture: Secondary | ICD-10-CM | POA: Diagnosis not present

## 2017-04-13 DIAGNOSIS — Z483 Aftercare following surgery for neoplasm: Secondary | ICD-10-CM

## 2017-04-13 DIAGNOSIS — M25612 Stiffness of left shoulder, not elsewhere classified: Secondary | ICD-10-CM

## 2017-04-13 DIAGNOSIS — C50912 Malignant neoplasm of unspecified site of left female breast: Secondary | ICD-10-CM | POA: Diagnosis not present

## 2017-04-13 DIAGNOSIS — Z170 Estrogen receptor positive status [ER+]: Secondary | ICD-10-CM | POA: Diagnosis not present

## 2017-04-13 NOTE — Therapy (Signed)
[place], [place], [address] Phone: [phone]   Fax:  [phone]  Physical Therapy Treatment  Patient Details  Name: Penny Huynh MRN: [id number] Date of Birth: [birth year] Referring Provider: [name]   Encounter Date: 04/13/2017  PT End of Session - 04/13/17 1125    Visit Number  5    Number of Visits  10    Date for PT Re-Evaluation  04/15/17    PT Start Time  0936    PT Stop Time  1022    PT Time Calculation (min)  46 min    Activity Tolerance  Patient tolerated treatment well    Behavior During Therapy  [facility] for tasks assessed/performed       Past Medical History:  Diagnosis Date  . Arthritis    "maybe a little in my left knee" (02/23/2017)  . Asthmatic bronchitis   . Cancer of left breast ([place])   . Coronary atherosclerosis    Minor nonobstructive CAD at cardiac catheterization 2009  . Dyslipidemia   . Femoral bruit 7/08   With normal ABIs  . Hypertension   . Lower extremity edema    Chronic  . Pneumonia ~ 1950   in 8th grade  . Type II diabetes mellitus ([place])     Past Surgical History:  Procedure Laterality Date  . APPENDECTOMY    . BREAST BIOPSY Left 02/2017  . CARDIAC CATHETERIZATION     "long time ago" (02/23/2017)  . MASTECTOMY COMPLETE / SIMPLE W/ SENTINEL NODE BIOPSY Left 02/23/2017   TOTAL MATECTOMY;  LEFT AXILLARY SENTINEL LYMPH NODE BIOPSY ERAS PATHWAY  . MASTECTOMY WITH RADIOACTIVE SEED GUIDED EXCISION AND AXILLARY SENTINEL LYMPH NODE BIOPSY Left 02/23/2017   Procedure: LEFT TOTAL MATECTOMY;  LEFT AXILLARY SENTINEL LYMPH NODE BIOPSY ERAS PATHWAY;  Surgeon: Excell Seltzer, MD;  Location: [facility];  Service: General;  Laterality: Left;  . PILONIDAL CYST EXCISION    . PORTA CATH INSERTION  02/23/2017  . PORTACATH PLACEMENT N/A 02/23/2017   Procedure: INSERTION PORT-A-CATH;  Surgeon: Excell Seltzer, MD;  Location: [facility];  Service: General;   Laterality: N/A;  . SHOULDER ARTHROSCOPY W/ ROTATOR CUFF REPAIR Right     There were no vitals filed for this visit.  Subjective Assessment - 04/13/17 0940    Subjective  The cording is much better after you stretched me last time. I've been wearing that soft sleeve as well and I think between that and continuing with the stretching my cording is much better.     Pertinent History  Patient underwent a left mastectomy and sentinel node biopsy (6 negative nodes) on 02/23/17. She will begin chemotherapy 03/31/17 as long as drain is removed today. She has 2 different breast cancers. Both are ER/PR positive but one is HER2 negative and one is HER2 positive. History of right shoulder scope 20 years ago and has diabetes and hypertension, both of which are well controlled.    Patient Stated Goals  See if my arm is ok    Currently in Pain?  No/denies         [facility] PT Assessment - 04/13/17 0001      AROM   Left Shoulder Extension  45 Degrees    Left Shoulder Flexion  131 Degrees    Left Shoulder ABduction  129 Degrees                  [facility] Adult PT Treatment/Exercise - 04/13/17 0001  Shoulder Exercises: Pulleys   Flexion  2 minutes    Flexion Limitations  Continued with VCs to decrease scapular compensation throughout    ABduction  2 minutes      Shoulder Exercises: Therapy Ball   Flexion  10 reps With forward lean into end of stretch    Flexion Limitations  VCs to remind pt of technique    ABduction  10 reps;Limitations Lt UE with same side lean into end of stretch    ABduction Limitations  Demonstration and VCs for correct technique      Manual Therapy   Manual Therapy  Manual Lymphatic Drainage (MLD);Passive ROM;Myofascial release    Myofascial Release  --    Manual Lymphatic Drainage (MLD)  In Supine short neck, 5 diaphragmatic breaths, Rt axilla and Lt inguinal nodes, anterior inter-axillary and Rt axillo-inguinal anastomsis, then focused on Lt lateral-posterior upper  trunk,  instructing pt in this and having her return demonstration    Passive ROM  --             PT Education - 04/13/17 1020    Education provided  Yes    Education Details  Supine scapular series and self manual lymph drainage    Person(s) Educated  Patient    Methods  Explanation;Demonstration;Handout    Comprehension  Verbalized understanding;Returned demonstration;Need further instruction          PT Long Term Goals - 04/13/17 1135      PT LONG TERM GOAL #1   Title  Patient will be independent in her home exercise program for shoulder ROM.    Baseline  Pt is independent with inital HEP for stretching and began postural strength today-04/13/17    Status  Partially Met      PT LONG TERM GOAL #2   Title  Patient will demonstrate shoulder flexion AROM to be >/= 140 degrees for increased ease reaching overhead.    Baseline  65 degrees; 131 degrees - 04/13/17    Status  On-going      PT LONG TERM GOAL #3   Title  Patient will demonstrate shoulder abduction AROM to be >/= 140 degrees for increased ease reaching overhead.    Baseline  52 degrees; 129 degrees - 04/13/17    Status  On-going      PT LONG TERM GOAL #4   Title  Patient will score </= 10 on the DASH indicating increased shoulder function.    Baseline  54.55    Status  On-going      PT LONG TERM GOAL #5   Title  Patient will verbalize she understands lymphedema risk reduction.    Status  On-going           Plan - 04/13/17 1127    Clinical Impression Statement  Pt has made excellent progress since last visit. She reports since cording released during stretching at last visit her A/ROM has drastically improved and the cording is much less palpable. Largest complaint now is swelling still at and posterior to Lt axilla so spent most manual therapy time today instructing pt in self manual lymph drainage and also instructed in supine scapular series which she tolerated very well. Pt is very pleased with how she  is progressing thus far and plans to cont HEP stretching at home and adding new exs/ MLD issued today.     Rehab Potential  Excellent    PT Frequency  2x / week    PT Duration  4 weeks  PT Treatment/Interventions  ADLs/Self Care Home Management;Therapeutic exercise;Patient/family education;Orthotic Fit/Training;Manual techniques;Passive range of motion;Manual lymph drainage;Scar mobilization;DME Instruction    PT Next Visit Plan  Issue returned script for compression bra. Pt requires renewal in next 1-2 visits (unless she feels ready for D/C at next visit??)/ haver her retake the DASH for goal assess. Review supine scapular series and self manual lymph drainage, also instructing husband in posterior inter-axillary anastomosis; cont myofascial release to cording and end P/ROM stretching.     Consulted and Agree with Plan of Care  Patient       Patient will benefit from skilled therapeutic intervention in order to improve the following deficits and impairments:  Postural dysfunction, Decreased knowledge of precautions, Decreased knowledge of use of DME, Impaired UE functional use, Decreased range of motion, Decreased strength, Increased fascial restricitons, Decreased scar mobility  Visit Diagnosis: Aftercare following surgery for neoplasm  Stiffness of left shoulder, not elsewhere classified  Abnormal posture     Problem List Patient Active Problem List   Diagnosis Date Noted  . Stage II breast cancer, left ([place]) 02/23/2017  . Carcinoma of upper-outer quadrant of left breast in female, estrogen receptor positive ([place]) 02/10/2017  . Carcinoma of upper-inner quadrant of left breast in female, estrogen receptor positive ([place]) 02/10/2017  . Chest pain 07/18/2015  . Hypokalemia 07/18/2015  . Vertigo 07/18/2015  . LBBB (left bundle branch block) 05/28/2009  . AODM 12/21/2007  . Essential hypertension, benign 12/21/2007  . DIASTOLIC DYSFUNCTION 02/54/2706  . FEMORAL BRUIT, RIGHT  12/21/2007  . DYSPNEA 12/21/2007    Otelia Limes, PTA 04/13/2017, 11:39 AM  [place] [employer], [place], [address] Phone: [phone]   Fax:  [phone]  Name: JELESA MANGINI MRN: [id number] Date of Birth: May 06, 1933

## 2017-04-13 NOTE — Patient Instructions (Signed)
Over Head Pull: Narrow and Wide Grip   Cancer Rehab [phone]   On back, knees bent, feet flat, band across thighs, elbows straight but relaxed. Pull hands apart (start). Keeping elbows straight, bring arms up and over head, hands toward floor. Keep pull steady on band. Hold momentarily. Return slowly, keeping pull steady, back to start. Then do same with a wider grip on the band (past shoulder width) Repeat _5-10__ times. Band color __yellow____   Side Pull: Double Arm   On back, knees bent, feet flat. Arms perpendicular to body, shoulder level, elbows straight but relaxed. Pull arms out to sides, elbows straight. Resistance band comes across collarbones, hands toward floor. Hold momentarily. Slowly return to starting position. Repeat _5-10__ times. Band color _yellow____   Sword   On back, knees bent, feet flat, left hand on left hip, right hand above left. Pull right arm DIAGONALLY (hip to shoulder) across chest. Bring right arm along head toward floor. Hold momentarily. Slowly return to starting position. Repeat _5-10__ times. Do with left arm. Band color _yellow_____   Shoulder Rotation: Double Arm   On back, knees bent, feet flat, elbows tucked at sides, bent 90, hands palms up. Pull hands apart and down toward floor, keeping elbows near sides. Hold momentarily. Slowly return to starting position. Repeat _5-10__ times. Band color __yellow____    Self manual lymph drainage: Perform this sequence once a day.  Only give enough pressure no your skin to make the skin move. Hug yourself.  Do circles at your neck just above your collarbones.  Repeat this 10 times. Diaphragmatic - Supine   Inhale through nose making navel move out toward hands. Exhale through puckered lips, hands follow navel in. Repeat _5__ times. Rest _10__ seconds between repeats.    Axilla - One at a Time   Using full weight of flat hand and fingers at center of uninvolved armpit, make _10__ in-place circles.     LEG: Inguinal Nodes Stimulation   With small finger side of hand against hip crease on involved side, gently perform circles at the crease. Repeat __10_ times.   Copyright  [employer]HI. All rights reserved.  1) Axilla to Inguinal Nodes - Sweep   On involved side, sweep _4__ times from armpit along side of trunk to hip crease.  Now gently stretch skin from the involved side to the uninvolved side across the chest at the shoulder line.  Repeat that 4 times.  Now focus on area of swelling near armpit and slightly behind this directing fluid to pathways you just made. Your husband can work on the upper pathway going across your upper back while you either lay on your side or sitting at dining table leaning on it  Finish by doing the pathways as described above going from your involved armpit to the same side groin and going across your upper chest from the involved shoulder to the uninvolved shoulder.  Repeat the steps above where you do circles in your left groin and right armpit. Copyright  VHI. All rights reserved.

## 2017-04-14 ENCOUNTER — Encounter: Payer: Self-pay | Admitting: Nurse Practitioner

## 2017-04-14 ENCOUNTER — Other Ambulatory Visit: Payer: Medicare Other

## 2017-04-14 ENCOUNTER — Inpatient Hospital Stay (HOSPITAL_BASED_OUTPATIENT_CLINIC_OR_DEPARTMENT_OTHER): Payer: Medicare Other | Admitting: Nurse Practitioner

## 2017-04-14 ENCOUNTER — Inpatient Hospital Stay: Payer: Medicare Other

## 2017-04-14 VITALS — BP 149/63 | HR 66 | Temp 97.8°F | Resp 18 | Ht 64.5 in | Wt 154.9 lb

## 2017-04-14 DIAGNOSIS — Z95828 Presence of other vascular implants and grafts: Secondary | ICD-10-CM

## 2017-04-14 DIAGNOSIS — Z170 Estrogen receptor positive status [ER+]: Secondary | ICD-10-CM | POA: Diagnosis not present

## 2017-04-14 DIAGNOSIS — R918 Other nonspecific abnormal finding of lung field: Secondary | ICD-10-CM | POA: Diagnosis not present

## 2017-04-14 DIAGNOSIS — I2510 Atherosclerotic heart disease of native coronary artery without angina pectoris: Secondary | ICD-10-CM

## 2017-04-14 DIAGNOSIS — E871 Hypo-osmolality and hyponatremia: Secondary | ICD-10-CM

## 2017-04-14 DIAGNOSIS — Z9012 Acquired absence of left breast and nipple: Secondary | ICD-10-CM | POA: Diagnosis not present

## 2017-04-14 DIAGNOSIS — R21 Rash and other nonspecific skin eruption: Secondary | ICD-10-CM | POA: Diagnosis not present

## 2017-04-14 DIAGNOSIS — C50412 Malignant neoplasm of upper-outer quadrant of left female breast: Secondary | ICD-10-CM

## 2017-04-14 DIAGNOSIS — E119 Type 2 diabetes mellitus without complications: Secondary | ICD-10-CM

## 2017-04-14 DIAGNOSIS — Z79899 Other long term (current) drug therapy: Secondary | ICD-10-CM

## 2017-04-14 DIAGNOSIS — I10 Essential (primary) hypertension: Secondary | ICD-10-CM | POA: Diagnosis not present

## 2017-04-14 DIAGNOSIS — C50212 Malignant neoplasm of upper-inner quadrant of left female breast: Secondary | ICD-10-CM | POA: Diagnosis not present

## 2017-04-14 DIAGNOSIS — Z5111 Encounter for antineoplastic chemotherapy: Secondary | ICD-10-CM | POA: Diagnosis not present

## 2017-04-14 LAB — CMP (CANCER CENTER ONLY)
ALBUMIN: 3.4 g/dL — AB (ref 3.5–5.0)
ALT: 15 U/L (ref 0–55)
ANION GAP: 6 (ref 3–11)
AST: 16 U/L (ref 5–34)
Alkaline Phosphatase: 50 U/L (ref 40–150)
BUN: 15 mg/dL (ref 7–26)
CHLORIDE: 92 mmol/L — AB (ref 98–109)
CO2: 28 mmol/L (ref 22–29)
Calcium: 9.3 mg/dL (ref 8.4–10.4)
Creatinine: 0.77 mg/dL (ref 0.60–1.10)
GFR, Est AFR Am: >60 mL/min (ref >60)
GFR, Estimated: >60 mL/min (ref >60)
GLUCOSE: 106 mg/dL (ref 70–140)
POTASSIUM: 3.6 mmol/L (ref 3.5–5.1)
SODIUM: 126 mmol/L — AB (ref 136–145)
Total Bilirubin: 0.8 mg/dL (ref 0.2–1.2)
Total Protein: 6 g/dL — ABNORMAL LOW (ref 6.4–8.3)

## 2017-04-14 LAB — CBC WITH DIFFERENTIAL (CANCER CENTER ONLY)
BASOS ABS: 0 10*3/uL (ref 0.0–0.1)
BASOS PCT: 1 %
EOS ABS: 0.1 10*3/uL (ref 0.0–0.5)
Eosinophils Relative: 2 %
HEMATOCRIT: 32.5 % — AB (ref 34.8–46.6)
Hemoglobin: 10.7 g/dL — ABNORMAL LOW (ref 11.6–15.9)
Lymphocytes Relative: 29 %
Lymphs Abs: 1.4 10*3/uL (ref 0.9–3.3)
MCH: 29.9 pg (ref 25.1–34.0)
MCHC: 32.9 g/dL (ref 31.5–36.0)
MCV: 90.8 fL (ref 79.5–101.0)
MONO ABS: 0.5 10*3/uL (ref 0.1–0.9)
Monocytes Relative: 11 %
NEUTROS ABS: 2.8 10*3/uL (ref 1.5–6.5)
Neutrophils Relative %: 57 %
PLATELETS: 233 10*3/uL (ref 145–400)
RBC: 3.58 MIL/uL — ABNORMAL LOW (ref 3.70–5.45)
RDW: 12.5 % (ref 11.2–14.5)
WBC Count: 4.8 10*3/uL (ref 3.9–10.3)

## 2017-04-14 MED ORDER — DIPHENHYDRAMINE HCL 50 MG/ML IJ SOLN
50.0000 mg | Freq: Once | INTRAMUSCULAR | Status: AC
  Administered 2017-04-14: 50 mg via INTRAVENOUS

## 2017-04-14 MED ORDER — SODIUM CHLORIDE 0.9% FLUSH
10.0000 mL | INTRAVENOUS | Status: DC | PRN
  Administered 2017-04-14: 10 mL
  Filled 2017-04-14: qty 10

## 2017-04-14 MED ORDER — ACETAMINOPHEN 325 MG PO TABS
ORAL_TABLET | ORAL | Status: AC
  Filled 2017-04-14: qty 2

## 2017-04-14 MED ORDER — HEPARIN SOD (PORK) LOCK FLUSH 100 UNIT/ML IV SOLN
500.0000 [IU] | Freq: Once | INTRAVENOUS | Status: AC | PRN
  Administered 2017-04-14: 500 [IU]
  Filled 2017-04-14: qty 5

## 2017-04-14 MED ORDER — SODIUM CHLORIDE 0.9 % IV SOLN
20.0000 mg | Freq: Once | INTRAVENOUS | Status: AC
  Administered 2017-04-14: 20 mg via INTRAVENOUS
  Filled 2017-04-14: qty 2

## 2017-04-14 MED ORDER — SODIUM CHLORIDE 0.9 % IV SOLN
Freq: Once | INTRAVENOUS | Status: AC
  Administered 2017-04-14: 09:00:00 via INTRAVENOUS

## 2017-04-14 MED ORDER — SODIUM CHLORIDE 0.9 % IV SOLN
80.0000 mg/m2 | Freq: Once | INTRAVENOUS | Status: AC
  Administered 2017-04-14: 144 mg via INTRAVENOUS
  Filled 2017-04-14: qty 24

## 2017-04-14 MED ORDER — DIPHENHYDRAMINE HCL 50 MG/ML IJ SOLN
INTRAMUSCULAR | Status: AC
  Filled 2017-04-14: qty 1

## 2017-04-14 MED ORDER — DEXAMETHASONE SODIUM PHOSPHATE 10 MG/ML IJ SOLN
INTRAMUSCULAR | Status: AC
  Filled 2017-04-14: qty 1

## 2017-04-14 MED ORDER — FAMOTIDINE IN NACL 20-0.9 MG/50ML-% IV SOLN: 20.0000 mg | Freq: Once | INTRAVENOUS | Status: DC

## 2017-04-14 MED ORDER — TRASTUZUMAB CHEMO 150 MG IV SOLR
150.0000 mg | Freq: Once | INTRAVENOUS | Status: AC
  Administered 2017-04-14: 150 mg via INTRAVENOUS
  Filled 2017-04-14: qty 7.14

## 2017-04-14 MED ORDER — DEXAMETHASONE SODIUM PHOSPHATE 10 MG/ML IJ SOLN
10.0000 mg | Freq: Once | INTRAMUSCULAR | Status: AC
  Administered 2017-04-14: 10 mg via INTRAVENOUS

## 2017-04-14 MED ORDER — SODIUM CHLORIDE 0.9 % IV SOLN: 10.0000 mg | Freq: Once | INTRAVENOUS | Status: DC

## 2017-04-14 MED ORDER — ACETAMINOPHEN 325 MG PO TABS
650.0000 mg | ORAL_TABLET | Freq: Once | ORAL | Status: AC
  Administered 2017-04-14: 650 mg via ORAL

## 2017-04-14 NOTE — Progress Notes (Signed)
[place]  [phone] [phone]  Clinic Follow up Note   Patient Care Team: Manon Hilding, MD as PCP - General (Cardiology) Truitt Merle, MD as Consulting Physician (Hematology) Excell Seltzer, MD as Consulting Physician (General Surgery) Alla Feeling, NP as Nurse Practitioner (Nurse Practitioner) [name], MD as Attending Physician (Cardiology) 04/14/2017  SUMMARY OF ONCOLOGIC HISTORY: Oncology History   Cancer Staging Carcinoma of upper-inner quadrant of left breast in female, estrogen receptor positive ([place]) Staging form: Breast, AJCC 8th Edition - Clinical stage from 01/18/2017: Stage IB (cT2, cN0, cM0, G3, ER: Positive, PR: Positive, HER2: Positive) - Signed by Truitt Merle, MD on 02/10/2017 - Pathologic stage from 02/23/2017: Stage IB (pT3, pN0, cM0, G3, ER: Positive, PR: Positive, HER2: Positive) - Signed by Alla Feeling, NP on 03/16/2017  Carcinoma of upper-outer quadrant of left breast in female, estrogen receptor positive ([place]) Staging form: Breast, AJCC 8th Edition - Clinical stage from 01/18/2017: Stage IB (cT2, cN0, cM0, G2, ER: Positive, PR: Positive, HER2: Negative) - Unsigned - Pathologic stage from 02/23/2017: Stage IB (pT2, pN0(sn), cM0, G3, ER: Positive, PR: Positive, HER2: Negative) - Signed by Alla Feeling, NP on 03/16/2017       Carcinoma of upper-outer quadrant of left breast in female, estrogen receptor positive ([place])   01/13/2017 Mammogram    FINDINGS: In the left breast, a spiculated mass lies in the upper outer quadrant. In the medial posterior left breast, there is a larger lobulated mass. These masses correspond to the palpable abnormalities. No other discrete left breast masses.  In the right breast, there are no discrete masses. There are no areas of architectural distortion.  In both breasts there are scattered calcifications, rounded punctate, with a few other larger coarse dystrophic  calcifications, without significant change from the previous screening mammogram, which is dated 12/04/2009.  Mammographic images were processed with CAD.  On physical exam, there is a firm palpable mass in the upper outer left breast and another firm mass in the medial left breast.       01/13/2017 Breast US    Targeted ultrasound is performed, showing an irregular hypoechoic shadowing mass in the left breast at the 2:30 o'clock position, 5 cm the nipple, measuring 3.3 x 2.8 x 2.6 cm. In the 10:30 o'clock position of the left breast, 5 cm the nipple, there is irregular hypoechoic mass with somewhat more lobulated margins, corresponding to the lobulated mass seen mammographically. This measures 4.8 x 2.9 x 4.8 cm. Both masses show internal vascularity on color Doppler analysis.  Sonographic evaluation of the left axilla shows several nodes with thickened cortices. Status cortex measured is 5 mm. None of these lymph nodes appear enlarged but overall size criteria.  IMPRESSION: 1. Two masses in the left breast, 1 at the 2:30 o'clock position in the other at the 10:30 o'clock position, both highly suspicious for breast carcinoma. 2. Several abnormal left axillary lymph nodes with thickened cortices. 3. No evidence of right breast malignancy.       01/18/2017 Pathology Results    Diagnosis 1. Breast, left, needle core biopsy, 2:30 o'clock - INVASIVE MAMMARY CARCINOMA - MAMMARY CARCINOMA IN-SITU - SEE COMMENT 2. Lymph node, needle/core biopsy, left axilla - NO CARCINOMA IDENTIFIED - SEE COMMENT 3. Breast, left, needle core biopsy, 10:30 o'clock - INVASIVE DUCTAL CARCINOMA - SEE COMMENT  1. PROGNOSTIC INDICATORS Results: IMMUNOHISTOCHEMICAL AND MORPHOMETRIC ANALYSIS PERFORMED MANUALLY Estrogen Receptor: 100%, POSITIVE, STRONG STAINING INTENSITY Progesterone Receptor: 60%, POSITIVE,  STRONG STAINING INTENSITY Proliferation Marker Ki67: 60%  1. FLUORESCENCE IN-SITU  HYBRIDIZATION Results: HER2 - NEGATIVE RATIO OF HER2/CEP17 SIGNALS 1.28 AVERAGE HER2 COPY NUMBER PER CELL 2.75  3. PROGNOSTIC INDICATORS Results: IMMUNOHISTOCHEMICAL AND MORPHOMETRIC ANALYSIS PERFORMED MANUALLY Estrogen Receptor: 100%, POSITIVE, STRONG STAINING INTENSITY Progesterone Receptor: 50%, POSITIVE, STRONG STAINING INTENSITY Proliferation Marker Ki67: 70%  3. FLUORESCENCE IN-SITU HYBRIDIZATION Results: HER2 - **POSITIVE** RATIO OF HER2/CEP17 SIGNALS 2.80 AVERAGE HER2 COPY NUMBER PER CELL 6.15  Microscopic Comment 1. The biopsy material shows an infiltrative proliferation of cells with large vesicular nuclei with inconspicuous nucleoli, arranged linearly and in small clusters. Based on the biopsy, the carcinoma appears Nottingham grade 2 of 3 and measures 0.9 cm in greatest linear extent. E-cadherin and prognostic markers (ER/PR/ki-67/HER2-FISH)are pending and will be reported in an addendum. Dr. Lyndon Code reviewed the case and agrees with the above diagnosis.      02/10/2017 Initial Diagnosis    Carcinoma of upper-outer quadrant of left breast in female, estrogen receptor positive ([place])      02/19/2017 Imaging    Bone Scan whole Body 02/19/17 IMPRESSION: Negative for evidence of bony metastatic disease.      02/19/2017 Imaging    CT CAP W Contrast 02/19/17 IMPRESSION: 1. Small right middle lobe pulmonary nodules up to 0.8 cm, nonspecific. Non-contrast chest CT at 3-6 months is recommended. If the nodules are stable at time of repeat CT, then future CT at 18-24 months (from today's scan) is considered optional for low-risk patients, but is recommended for high-risk patients. This recommendation follows the consensus statement: Guidelines for Management of Incidental Pulmonary Nodules Detected on CT Images: From the Fleischner Society 2017; Radiology 2017; 284:228-243. 2. Subcentimeter hepatic lesions likely cysts but incompletely characterized due to small size. Attention  on follow-up imaging recommended. 3. 2.1 cm fusiform right common iliac artery aneurysm. Continued surveillance recommended. 4. Aortic Atherosclerosis (ICD10-I70.0) and Emphysema (ICD10-J43.9).      02/23/2017 Pathology Results    Diagnosis 1. Breast, simple mastectomy, Left Total - INVASIVE DUCTAL CARCINOMA, MULTIFOCAL, NOTTINGHAM GRADE 3/3 (5.3 CM, 3.5 CM) - INVASIVE CARCINOMA INVOLVES THE DERMIS - DUCTAL CARCINOMA IN SITU, INTERMEDIATE GRADE - HYALINIZED FIBROADENOMA - MARGINS UNINVOLVED BY CARCINOMA - NO CARCINOMA IDENTIFIED IN TWO LYMPH NODES (0/2) - SEE ONCOLOGY TABLE AND COMMENT BELOW 2. Lymph node, sentinel, biopsy, Left Axillary - NO CARCINOMA IDENTIFIED IN ONE LYMPH NODE (0/1) 3. Lymph node, sentinel, biopsy - NO CARCINOMA IDENTIFIED IN ONE LYMPH NODE (0/1) 4. Lymph node, sentinel, biopsy - NO CARCINOMA IDENTIFIED IN ONE LYMPH NODE (0/1) 5. Lymph node, sentinel, biopsy - NO CARCINOMA IDENTIFIED IN ONE LYMPH NODE (0/1) Microscopic Comment 1. BREAST, INVASIVE TUMOR Procedure: Simple mastectomy Laterality: Left Tumor Size: 5. 3 cm, 3.5 cm Histologic Type: Invasive carcinoma of no special type (ductal, not otherwise specified) Grade: Nottingham Grade 3 Tubular Differentiation: 3 Nuclear Pleomorphism: 3 Mitotic Count: 2 Ductal Carcinoma in Situ (DCIS): Present Extent of Tumor: Skin: Invasive carcinoma directly invades into the dermis or epidermis without skin ulceration Margins: Invasive carcinoma, distance from closest margin: 1.5 cm (posterior) DCIS, distance from closest margin: > 1 cm Regional Lymph Nodes: Number of Lymph Nodes Examined: 6 Number of Sentinel Lymph Nodes Examined: 4 Lymph Nodes with Macrometastases: 0 Lymph Nodes with Micrometastases: 0 Lymph Nodes with Isolated Tumor Cells: 0 Treatment effect: No known presurgical therapy Breast Prognostic Profile: See Also ([id number]) Estrogen Receptor: Positive (100%, strong); Positive (100%,  strong) Progesterone Receptor: Positive (50%, strong); Positive (50%, strong) Her2: Positive (Ratio 2.80);  Negative (Ratio 1.28) Ki-67: 70%; 60% Best tumor block for sendout testing: 1E Pathologic Stage Classification (pTNM, AJCC 8th Edition): Primary Tumor: mpT3 Regional Lymph Nodes: pN0 COMMENT: The two invasive carcinomas in the breast have slightly different morphologies. The larger lesion has a papillary and micropapillary pattern admixed with typical ductal carcinoma while the smaller lesion is more typical of a ductal lesion with linear arrays (E-cadherin performed on biopsy). There are foci within the larger lesion that are concerning for lymphovascular space invasion.       04/07/2017 -  Chemotherapy    weekly taxol and herceptin x12 weeks then herceptin q3 weeks for total 1 year      CURRENT THERAPY:  weekly taxol/herceptin x12 weeks beginning 04/07/17 then herceptin q3 weeks for total 1 year   INTERVAL HISTORY: Ms. Laham returns for follow-up as scheduled prior to cycle 2 weekly Taxol/Herceptin.  She received cycle 1 on 04/07/2017.  She feels well today, had occasional mild fatigue this past week but remained able to complete all activities normally.  Denies nausea or vomiting, no constipation or diarrhea. Did not require antiemetics. Eating and drinking well with good appetite, no mucositis.  Denies fever or chills.  Denies breast or other pain.  No swelling in her arms or legs.  She is waiting for a compression bra prescription from [name], she feels there is some lymphedema to her left side/back area. Continues PT on Tuesdays and Thursdays, should continue until next week but was informed she can extend if needed.  She was prescribed prednisone taper per Dr. Quintin Alto for right leg vasculitis.  She took 1 week of 40 mg then mistakenly skipped last week when she confused her pillbox; she will continue 20 mg daily this week to finish the taper.  Occasional leg cramps but denies calf  tenderness.  REVIEW OF SYSTEMS:   Constitutional: Denies fevers, chills or abnormal weight loss (+) periodic mild fatigue (+) good appetite Eyes: Denies blurriness of vision Ears, nose, mouth, throat, and face: Denies mucositis or sore throat Respiratory: Denies cough, dyspnea or wheezes Cardiovascular: Denies palpitation, chest discomfort or lower extremity swelling (+) completing prednisone taper for lower extremity vasculitis per Dr. Quintin Alto Gastrointestinal:  Denies nausea, vomiting, constipation, diarrhea, heartburn or change in bowel habits Skin: Denies abnormal skin rashes Lymphatics: Denies new lymphadenopathy or easy bruising (+) mild swelling in left side/back, secondary to mastectomy Neurological:Denies numbness, tingling or new weaknesses Behavioral/Psych: Mood is stable, no new changes  MSK: (+) Intermittent leg cramps All other systems were reviewed with the patient and are negative.  MEDICAL HISTORY:  Past Medical History:  Diagnosis Date  . Arthritis    "maybe a little in my left knee" (02/23/2017)  . Asthmatic bronchitis   . Cancer of left breast ([place])   . Coronary atherosclerosis    Minor nonobstructive CAD at cardiac catheterization 2009  . Dyslipidemia   . Femoral bruit 7/08   With normal ABIs  . Hypertension   . Lower extremity edema    Chronic  . Pneumonia ~ 1950   in 8th grade  . Type II diabetes mellitus ([place])     SURGICAL HISTORY: Past Surgical History:  Procedure Laterality Date  . APPENDECTOMY    . BREAST BIOPSY Left 02/2017  . CARDIAC CATHETERIZATION     "long time ago" (02/23/2017)  . MASTECTOMY COMPLETE / SIMPLE W/ SENTINEL NODE BIOPSY Left 02/23/2017   TOTAL MATECTOMY;  LEFT AXILLARY SENTINEL LYMPH NODE BIOPSY ERAS PATHWAY  . MASTECTOMY WITH  RADIOACTIVE SEED GUIDED EXCISION AND AXILLARY SENTINEL LYMPH NODE BIOPSY Left 02/23/2017   Procedure: LEFT TOTAL MATECTOMY;  LEFT AXILLARY SENTINEL LYMPH NODE BIOPSY ERAS PATHWAY;  Surgeon: Excell Seltzer, MD;  Location: [facility];  Service: General;  Laterality: Left;  . PILONIDAL CYST EXCISION    . PORTA CATH INSERTION  02/23/2017  . PORTACATH PLACEMENT N/A 02/23/2017   Procedure: INSERTION PORT-A-CATH;  Surgeon: Excell Seltzer, MD;  Location: [facility];  Service: General;  Laterality: N/A;  . SHOULDER ARTHROSCOPY W/ ROTATOR CUFF REPAIR Right     I have reviewed the social history and family history with the patient and they are unchanged from previous note.  ALLERGIES:  has No Known Allergies.  MEDICATIONS:  Current Outpatient Medications  Medication Sig Dispense Refill  . aspirin 81 MG EC tablet Take 81 mg by mouth at bedtime.     . chlorthalidone (HYGROTON) 25 MG tablet Take 25 mg by mouth daily.     . Coenzyme Q10 (COQ10) 100 MG CAPS Take 100 mg by mouth daily.      Marland Kitchen latanoprost (XALATAN) 0.005 % ophthalmic solution Place 1 drop into both eyes at bedtime.    . lidocaine-prilocaine (EMLA) cream Apply to affected area once 30 g 3  . losartan (COZAAR) 50 MG tablet Take 50 mg by mouth daily.    . metFORMIN (GLUCOPHAGE) 500 MG tablet Take 1,000 mg by mouth daily with breakfast.     . vitamin B-12 (CYANOCOBALAMIN) 1000 MCG tablet Take 1,000 mcg by mouth daily.    . ondansetron (ZOFRAN) 8 MG tablet Take 1 tablet (8 mg total) by mouth 2 (two) times daily as needed (Nausea or vomiting). 30 tablet 1  . prochlorperazine (COMPAZINE) 10 MG tablet Take 1 tablet (10 mg total) by mouth every 6 (six) hours as needed (Nausea or vomiting). 30 tablet 1  . traMADol (ULTRAM) 50 MG tablet Take 1 tablet (50 mg total) by mouth every 6 (six) hours as needed (mild pain). 15 tablet 0   No current facility-administered medications for this visit.    Facility-Administered Medications Ordered in Other Visits  Medication Dose Route Frequency Provider Last Rate Last Dose  . 0.9 %  sodium chloride infusion   Intravenous Once Truitt Merle, MD      . famotidine (PEPCID) 20 mg in sodium chloride 0.9 % 100 mL IVPB   20 mg Intravenous Once Shuda, Margaret E, [facility]      . heparin lock flush 100 unit/mL  500 Units Intracatheter Once PRN Truitt Merle, MD      . PACLitaxel (TAXOL) 144 mg in sodium chloride 0.9 % 250 mL chemo infusion (</= 66m/m2)  80 mg/m2 (Treatment Plan Recorded) Intravenous Once FTruitt Merle MD      . sodium chloride flush (NS) 0.9 % injection 10 mL  10 mL Intracatheter PRN FTruitt Merle MD      . trastuzumab (HERCEPTIN) 150 mg in sodium chloride 0.9 % 250 mL chemo infusion  150 mg Intravenous Once FTruitt Merle MD        PHYSICAL EXAMINATION: ECOG PERFORMANCE STATUS: 1 - Symptomatic but completely ambulatory  Vitals:   04/14/17 0820  BP: (!) 149/63  Pulse: 66  Resp: 18  Temp: 97.8 F (36.6 C)  SpO2: 100%   Filed Weights   04/14/17 0820  Weight: 154 lb 14.4 oz (70.3 kg)    GENERAL:alert, no distress and comfortable SKIN: skin color, texture, turgor are normal, no rashes or significant lesions EYES: normal, Conjunctiva are  pink and non-injected, sclera clear OROPHARYNX:no exudate, no erythema and lips, buccal mucosa, and tongue normal  LYMPH:  no palpable cervical, supraclavicular, or axillary lymphadenopathy  LUNGS: clear to auscultation bilaterally with normal breathing effort HEART: regular rate & rhythm and no murmurs and no lower extremity edema. (+) Scattered skin hyperpigmentation to right lower extremity, no calf tenderness or edema ABDOMEN:abdomen soft, non-tender and normal bowel sounds Musculoskeletal:no cyanosis of digits and no clubbing  NEURO: alert & oriented x 3 with fluent speech, no focal motor/sensory deficits Breasts: S/p left mastectomy, surgical incision healing well, no erythema or drainage; mild lymphedema to left lateral chest wall PAC without erythema  LABORATORY DATA:  I have reviewed the data as listed CBC Latest Ref Rng & Units 04/14/2017 04/07/2017 02/24/2017  WBC 3.9 - 10.3 K/uL 4.8 8.6 -  Hemoglobin 12.0 - 15.0 g/dL - - 10.3(L)  Hematocrit 34.8 - 46.6 %  32.5(L) 34.6(L) 31.1(L)  Platelets 145 - 400 K/uL 233 266 -     CMP Latest Ref Rng & Units 04/14/2017 04/07/2017 02/24/2017  Glucose 70 - 140 mg/dL 106 84 203(H)  BUN 7 - 26 mg/dL 15 12 6   Creatinine 0.60 - 1.10 mg/dL 0.77 0.75 0.80  Sodium 136 - 145 mmol/L 126(L) 132(L) 132(L)  Potassium 3.5 - 5.1 mmol/L 3.6 3.4(L) 3.7  Chloride 98 - 109 mmol/L 92(L) 95(L) 97(L)  CO2 22 - 29 mmol/L 28 28 24   Calcium 8.4 - 10.4 mg/dL 9.3 9.5 8.6(L)  Total Protein 6.4 - 8.3 g/dL 6.0(L) 6.5 -  Total Bilirubin 0.2 - 1.2 mg/dL 0.8 0.5 -  Alkaline Phos 40 - 150 U/L 50 50 -  AST 5 - 34 U/L 16 12 -  ALT 0 - 55 U/L 15 12 -   PATHOLOGY  LEFT SIMPLE MASTECTOMY WITH SNLB 02/23/17 per [name] Diagnosis 1. Breast, simple mastectomy, Left Total - INVASIVE DUCTAL CARCINOMA, MULTIFOCAL, NOTTINGHAM GRADE 3/3 (5.3 CM, 3.5 CM) - INVASIVE CARCINOMA INVOLVES THE DERMIS - DUCTAL CARCINOMA IN SITU, INTERMEDIATE GRADE - HYALINIZED FIBROADENOMA - MARGINS UNINVOLVED BY CARCINOMA - NO CARCINOMA IDENTIFIED IN TWO LYMPH NODES (0/2) - SEE ONCOLOGY TABLE AND COMMENT BELOW 2. Lymph node, sentinel, biopsy, Left Axillary - NO CARCINOMA IDENTIFIED IN ONE LYMPH NODE (0/1) 3. Lymph node, sentinel, biopsy - NO CARCINOMA IDENTIFIED IN ONE LYMPH NODE (0/1) 4. Lymph node, sentinel, biopsy - NO CARCINOMA IDENTIFIED IN ONE LYMPH NODE (0/1) 5. Lymph node, sentinel, biopsy - NO CARCINOMA IDENTIFIED IN ONE LYMPH NODE (0/1) Microscopic Comment 1. BREAST, INVASIVE TUMOR Procedure: Simple mastectomy Laterality: Left Tumor Size: 5. 3 cm, 3.5 cm Histologic Type: Invasive carcinoma of no special type (ductal, not otherwise specified) Grade: Nottingham Grade 3 Tubular Differentiation: 3 Nuclear Pleomorphism: 3 Mitotic Count: 2 Ductal Carcinoma in Situ (DCIS): Present Extent of Tumor: Skin: Invasive carcinoma directly invades into the dermis or epidermis without skin ulceration Margins: Invasive carcinoma, distance from closest  margin: 1.5 cm (posterior) DCIS, distance from closest margin: > 1 cm Regional Lymph Nodes: Number of Lymph Nodes Examined: 6 Number of Sentinel Lymph Nodes Examined: 4 Lymph Nodes with Macrometastases: 0 Lymph Nodes with Micrometastases: 0 Lymph Nodes with Isolated Tumor Cells: 0 Treatment effect: No known presurgical therapy Breast Prognostic Profile: See Also ([id number]) Estrogen Receptor: Positive (100%, strong); Positive (100%, strong) Progesterone Receptor: Positive (50%, strong); Positive (50%, strong) Her2: Positive (Ratio 2.80); Negative (Ratio 1.28) Ki-67: 70%; 60% Best tumor block for sendout testing: 1E Pathologic Stage Classification (pTNM, AJCC 8th Edition): Primary Tumor:  mpT3 Regional Lymph Nodes: pN0 COMMENT: The two invasive carcinomas in the breast have slightly different morphologies. The larger lesion has a papillary and micropapillary pattern admixed with typical ductal carcinoma while the smaller lesion is more typical of a ductal lesion with linear arrays (E-cadherin performed on biopsy). There are foci within the larger lesion that are concerning for lymphovascular space invasion.   RADIOGRAPHIC STUDIES: I have personally reviewed the radiological images as listed and agreed with the findings in the report. No results found.   ASSESSMENT & PLAN: Zanaiya Calabria is an 84 year old with past medical history positive for HTN, DM, CAD now with newly diagnosed left breast cancer  1.Multifocal invasive ductal carcinoma of the left breast of female;carcinoma of the upper-outer quadrant of left breast and female, ER/PR positive, HER-2 negative pT2N0M0, G2, Stage 1B; IDC of the upper-inner quadrant left breast, ER+/PR+/HER2+, pT3N0M0 G3, stage IB -Ms. [place] appears stable today.  She completed cycle 1 day 1 weekly Taxol/Herceptin on 04/07/2017.  She tolerated very well.  Continues to recover well from left mastectomy, mild lymphedema, she continues PT and awaiting  compression bra per [name].  Vital signs and weight stable.  Labs reviewed, has mild anemia secondary to chemotherapy, Hgb 10.7.  She is mildly hyponatremic.  Labs adequate for treatment.  She is on prednisone taper for vasculitis, will decrease Decadron premed to 10 mg and continue for duration of treatment.  Proceed with cycle 1 day 8 Taxol/Herceptin.  Continue weekly for total of 12 weeks then continue maintenance Herceptin every 3 weeks for 1 year.  Return in 1 week for cycle 1 day 15, follow-up in 2 weeks with next cycle.    2.  Hyponatremia -Na 126 today, does usually run low in low 130's.  Not on SSRI.  On chlorthalidone diuretic for HTN.  I recommend she hold this medication and increase salt intake.  She will call PCP to see if adjustment needs to be made in losartan while off diuretic. Encouraged to monitor her BP. She is asymptomatic; will monitor closely.  3. HTN, CAD, DM -She is completing prednisone taper for lower extremity vasculitis per Dr. Quintin Alto.  She has very faint scattered hyperpigmentation to right lower extremity, exam is otherwise normal.  Continue follow-up with PCP and cardiology.  PLAN -Labs reviewed, adequate for treatment.  Proceed with cycle 1 day 8 Taxol/Herceptin, continue weekly -Return in 1 week for cycle 1 day 15, -Follow-up in 2 weeks with next treatment -Hold chlorthalidone for hyponatremia, f/u with PCP for possible adjustment to losartan -Continue PT   All questions were answered. The patient knows to call the clinic with any problems, questions or concerns. No barriers to learning was detected.     Alla Feeling, NP 04/14/17

## 2017-04-14 NOTE — Patient Instructions (Signed)
[facility] Discharge Instructions for Patients Receiving Chemotherapy  Today you received the following chemotherapy agents:  Herceptin and Taxol.  To help prevent nausea and vomiting after your treatment, we encourage you to take your nausea medication as directed.   If you develop nausea and vomiting that is not controlled by your nausea medication, call the clinic.   BELOW ARE SYMPTOMS THAT SHOULD BE REPORTED IMMEDIATELY:  *FEVER GREATER THAN 100.5 F  *CHILLS WITH OR WITHOUT FEVER  NAUSEA AND VOMITING THAT IS NOT CONTROLLED WITH YOUR NAUSEA MEDICATION  *UNUSUAL SHORTNESS OF BREATH  *UNUSUAL BRUISING OR BLEEDING  TENDERNESS IN MOUTH AND THROAT WITH OR WITHOUT PRESENCE OF ULCERS  *URINARY PROBLEMS  *BOWEL PROBLEMS  UNUSUAL RASH Items with * indicate a potential emergency and should be followed up as soon as possible.  Feel free to call the clinic should you have any questions or concerns. The clinic phone number is [phone].  Please show the CHEMO ALERT CARD at check-in to the Emergency Department and triage nurse.   

## 2017-04-15 ENCOUNTER — Ambulatory Visit: Payer: Medicare Other

## 2017-04-15 DIAGNOSIS — Z483 Aftercare following surgery for neoplasm: Secondary | ICD-10-CM

## 2017-04-15 DIAGNOSIS — R293 Abnormal posture: Secondary | ICD-10-CM

## 2017-04-15 DIAGNOSIS — M25612 Stiffness of left shoulder, not elsewhere classified: Secondary | ICD-10-CM

## 2017-04-15 DIAGNOSIS — Z170 Estrogen receptor positive status [ER+]: Secondary | ICD-10-CM | POA: Diagnosis not present

## 2017-04-15 DIAGNOSIS — C50912 Malignant neoplasm of unspecified site of left female breast: Secondary | ICD-10-CM | POA: Diagnosis not present

## 2017-04-15 NOTE — Therapy (Signed)
[place], [place], [address] Phone: [phone]   Fax:  [phone]  Physical Therapy Treatment  Patient Details  Name: Penny Huynh MRN: [id number] Date of Birth: [birth year] Referring Provider: [name]   Encounter Date: 04/15/2017  PT End of Session - 04/15/17 1018    Visit Number  6    Number of Visits  10    Date for PT Re-Evaluation  04/15/17    PT Start Time  0926    PT Stop Time  1017    PT Time Calculation (min)  51 min    Activity Tolerance  Patient tolerated treatment well    Behavior During Therapy  [facility] for tasks assessed/performed       Past Medical History:  Diagnosis Date  . Arthritis    "maybe a little in my left knee" (02/23/2017)  . Asthmatic bronchitis   . Cancer of left breast ([place])   . Coronary atherosclerosis    Minor nonobstructive CAD at cardiac catheterization 2009  . Dyslipidemia   . Femoral bruit 7/08   With normal ABIs  . Hypertension   . Lower extremity edema    Chronic  . Pneumonia ~ 1950   in 8th grade  . Type II diabetes mellitus ([place])     Past Surgical History:  Procedure Laterality Date  . APPENDECTOMY    . BREAST BIOPSY Left 02/2017  . CARDIAC CATHETERIZATION     "long time ago" (02/23/2017)  . MASTECTOMY COMPLETE / SIMPLE W/ SENTINEL NODE BIOPSY Left 02/23/2017   TOTAL MATECTOMY;  LEFT AXILLARY SENTINEL LYMPH NODE BIOPSY ERAS PATHWAY  . MASTECTOMY WITH RADIOACTIVE SEED GUIDED EXCISION AND AXILLARY SENTINEL LYMPH NODE BIOPSY Left 02/23/2017   Procedure: LEFT TOTAL MATECTOMY;  LEFT AXILLARY SENTINEL LYMPH NODE BIOPSY ERAS PATHWAY;  Surgeon: Excell Seltzer, MD;  Location: [place];  Service: General;  Laterality: Left;  . PILONIDAL CYST EXCISION    . PORTA CATH INSERTION  02/23/2017  . PORTACATH PLACEMENT N/A 02/23/2017   Procedure: INSERTION PORT-A-CATH;  Surgeon: Excell Seltzer, MD;  Location: [place];  Service: General;   Laterality: N/A;  . SHOULDER ARTHROSCOPY W/ ROTATOR CUFF REPAIR Right     There were no vitals filed for this visit.  Subjective Assessment - 04/15/17 0927    Subjective  I continue to see improvement with my Lt shoulder ROM. The swelling is about the same, I tried the massage but definitely want to review it again. I also brought my husband to learn the manual lymph drainage.    Pertinent History  Patient underwent a left mastectomy and sentinel node biopsy (6 negative nodes) on 02/23/17. She will begin chemotherapy 03/31/17 as long as drain is removed today. She has 2 different breast cancers. Both are ER/PR positive but one is HER2 negative and one is HER2 positive. History of right shoulder scope 20 years ago and has diabetes and hypertension, both of which are well controlled.    Patient Stated Goals  See if my arm is ok    Currently in Pain?  No/denies                      [facility] Adult PT Treatment/Exercise - 04/15/17 0001      Shoulder Exercises: Pulleys   Flexion  2 minutes    Flexion Limitations  VCs to decrease scapular compensation    ABduction  2 minutes      Shoulder Exercises: Therapy  Ball   Flexion  10 reps With forward lean into end of stretch    Flexion Limitations  VCs to remind pt of technique    ABduction  10 reps Lt UE with same side lean into end of stretch      Manual Therapy   Manual Therapy  Manual Lymphatic Drainage (MLD)    Manual Lymphatic Drainage (MLD)  In Supine short neck, 5 diaphragmatic breaths, Rt axilla and Lt inguinal nodes, anterior inter-axillary and Rt axillo-inguinal anastomsis, then focused on Lt lateral-posterior upper trunk,  instructing pt in this and having her return demonstration; then into Rt S/L for posterior inter-axillary anastomosis having pt practice this..              PT Education - 04/15/17 1018    Education provided  Yes    Education Details  Instructed pts husband in manual lymph drainage    Person(s)  Educated  Patient;Spouse    Methods  Explanation;Demonstration;Tactile cues;Verbal cues    Comprehension  Verbalized understanding;Returned demonstration;Verbal cues required;Tactile cues required;Need further instruction          PT Long Term Goals - 04/13/17 1135      PT LONG TERM GOAL #1   Title  Patient will be independent in her home exercise program for shoulder ROM.    Baseline  Pt is independent with inital HEP for stretching and began postural strength today-04/13/17    Status  Partially Met      PT LONG TERM GOAL #2   Title  Patient will demonstrate shoulder flexion AROM to be >/= 140 degrees for increased ease reaching overhead.    Baseline  65 degrees; 131 degrees - 04/13/17    Status  On-going      PT LONG TERM GOAL #3   Title  Patient will demonstrate shoulder abduction AROM to be >/= 140 degrees for increased ease reaching overhead.    Baseline  52 degrees; 129 degrees - 04/13/17    Status  On-going      PT LONG TERM GOAL #4   Title  Patient will score </= 10 on the DASH indicating increased shoulder function.    Baseline  54.55    Status  On-going      PT LONG TERM GOAL #5   Title  Patient will verbalize she understands lymphedema risk reduction.    Status  On-going      Breast Clinic Goals - 02/08/17 2024      Patient will be able to verbalize understanding of pertinent lymphedema risk reduction practices relevant to her diagnosis specifically related to skin care.   Status  Achieved      Patient will be able to return demonstrate and/or verbalize understanding of the post-op home exercise program related to regaining shoulder range of motion.   Status  Achieved      Patient will be able to verbalize understanding of the importance of attending the postoperative After Breast Cancer Class for further lymphedema risk reduction education and therapeutic exercise.   Status  Achieved           Plan - 04/15/17 1019    Clinical Impression Statement  Was  able to instruct pts husband in manual lymph drainage today while reviewing with pt as well. He required max tactile and VCs for correct pressure and to get a good skin stretch. He was able to return demonstration to an extent but will require firther education/instruction. Pts dates have run out but not visits  as her drains were in place longer than expected so delayed the beginning of her treatment. She would like to cont for further instruction of manual lymph drainage and she has not reached full A/ROM yet. Pt will benfit from continued therapy to cont progressing towards goals and independence with manual lymph drainage.    Rehab Potential  Excellent    PT Frequency  2x / week    PT Duration  4 weeks    PT Treatment/Interventions  ADLs/Self Care Home Management;Therapeutic exercise;Patient/family education;Orthotic Fit/Training;Manual techniques;Passive range of motion;Manual lymph drainage;Scar mobilization;DME Instruction    PT Next Visit Plan  Pt requires renewal in next visit/ haver her retake the DASH for goal assess. Review supine scapular series and self manual lymph drainage, also reviewing with husband posterior inter-axillary anastomosis; cont myofascial release to cording and end P/ROM stretching.     Consulted and Agree with Plan of Care  Patient    Family Member Consulted  Husband       Patient will benefit from skilled therapeutic intervention in order to improve the following deficits and impairments:  Postural dysfunction, Decreased knowledge of precautions, Decreased knowledge of use of DME, Impaired UE functional use, Decreased range of motion, Decreased strength, Increased fascial restricitons, Decreased scar mobility  Visit Diagnosis: Aftercare following surgery for neoplasm  Stiffness of left shoulder, not elsewhere classified  Abnormal posture     Problem List Patient Active Problem List   Diagnosis Date Noted  . Port-A-Cath in place 04/14/2017  . Stage II breast  cancer, left ([place]) 02/23/2017  . Carcinoma of upper-outer quadrant of left breast in female, estrogen receptor positive ([place]) 02/10/2017  . Carcinoma of upper-inner quadrant of left breast in female, estrogen receptor positive ([place]) 02/10/2017  . Chest pain 07/18/2015  . Hypokalemia 07/18/2015  . Vertigo 07/18/2015  . LBBB (left bundle branch block) 05/28/2009  . AODM 12/21/2007  . Essential hypertension, benign 12/21/2007  . DIASTOLIC DYSFUNCTION 03/15/1550  . FEMORAL BRUIT, RIGHT 12/21/2007  . DYSPNEA 12/21/2007    Otelia Limes, PTA 04/15/2017, 10:48 AM  [place] [address], [place], [address] Phone: [phone]   Fax:  [phone]  Name: Penny Huynh MRN: [id number] Date of Birth: December 10, 1933

## 2017-04-19 ENCOUNTER — Encounter: Payer: Self-pay | Admitting: Physical Therapy

## 2017-04-19 ENCOUNTER — Ambulatory Visit: Payer: Medicare Other | Admitting: Physical Therapy

## 2017-04-19 DIAGNOSIS — M25612 Stiffness of left shoulder, not elsewhere classified: Secondary | ICD-10-CM | POA: Diagnosis not present

## 2017-04-19 DIAGNOSIS — Z483 Aftercare following surgery for neoplasm: Secondary | ICD-10-CM | POA: Diagnosis not present

## 2017-04-19 DIAGNOSIS — C50912 Malignant neoplasm of unspecified site of left female breast: Secondary | ICD-10-CM | POA: Diagnosis not present

## 2017-04-19 DIAGNOSIS — R293 Abnormal posture: Secondary | ICD-10-CM

## 2017-04-19 DIAGNOSIS — Z170 Estrogen receptor positive status [ER+]: Secondary | ICD-10-CM | POA: Diagnosis not present

## 2017-04-19 NOTE — Therapy (Signed)
[place], [place], [address] Phone: [phone]   Fax:  [phone]  Physical Therapy Treatment  Patient Details  Name: Penny Huynh MRN: [id number] Date of Birth: 1933-04-22 Referring Provider: [name]   Encounter Date: 04/19/2017  PT End of Session - 04/19/17 1448    Visit Number  7    Number of Visits  18    Date for PT Re-Evaluation  05/17/17    PT Start Time  1601    PT Stop Time  1355    PT Time Calculation (min)  57 min    Activity Tolerance  Patient tolerated treatment well    Behavior During Therapy  [facility] for tasks assessed/performed       Past Medical History:  Diagnosis Date  . Arthritis    "maybe a little in my left knee" (02/23/2017)  . Asthmatic bronchitis   . Cancer of left breast ([place])   . Coronary atherosclerosis    Minor nonobstructive CAD at cardiac catheterization 2009  . Dyslipidemia   . Femoral bruit 7/08   With normal ABIs  . Hypertension   . Lower extremity edema    Chronic  . Pneumonia ~ 1950   in 8th grade  . Type II diabetes mellitus ([place])     Past Surgical History:  Procedure Laterality Date  . APPENDECTOMY    . BREAST BIOPSY Left 02/2017  . CARDIAC CATHETERIZATION     "long time ago" (02/23/2017)  . MASTECTOMY COMPLETE / SIMPLE W/ SENTINEL NODE BIOPSY Left 02/23/2017   TOTAL MATECTOMY;  LEFT AXILLARY SENTINEL LYMPH NODE BIOPSY ERAS PATHWAY  . MASTECTOMY WITH RADIOACTIVE SEED GUIDED EXCISION AND AXILLARY SENTINEL LYMPH NODE BIOPSY Left 02/23/2017   Procedure: LEFT TOTAL MATECTOMY;  LEFT AXILLARY SENTINEL LYMPH NODE BIOPSY ERAS PATHWAY;  Surgeon: Excell Seltzer, MD;  Location: [place];  Service: General;  Laterality: Left;  . PILONIDAL CYST EXCISION    . PORTA CATH INSERTION  02/23/2017  . PORTACATH PLACEMENT N/A 02/23/2017   Procedure: INSERTION PORT-A-CATH;  Surgeon: Excell Seltzer, MD;  Location: [place];  Service: General;   Laterality: N/A;  . SHOULDER ARTHROSCOPY W/ ROTATOR CUFF REPAIR Right     There were no vitals filed for this visit.  Subjective Assessment - 04/19/17 1300    Subjective  My left shoulder is feeling okay. I am still having some swelling. The massage is going pretty good.     Pertinent History  Patient underwent a left mastectomy and sentinel node biopsy (6 negative nodes) on 02/23/17. She will begin chemotherapy 03/31/17 as long as drain is removed today. She has 2 different breast cancers. Both are ER/PR positive but one is HER2 negative and one is HER2 positive. History of right shoulder scope 20 years ago and has diabetes and hypertension, both of which are well controlled.    Patient Stated Goals  See if my arm is ok    Currently in Pain?  No/denies    Pain Score  0-No pain               [name] - 04/19/17 0001    Open a tight or new jar  Moderate difficulty    Do heavy household chores (wash walls, wash floors)  Moderate difficulty    Carry a shopping bag or briefcase  No difficulty    Wash your back  No difficulty    Use a knife to cut food  No difficulty  Recreational activities in which you take some force or impact through your arm, shoulder, or hand (golf, hammering, tennis)  Mild difficulty    During the past week, to what extent has your arm, shoulder or hand problem interfered with your normal social activities with family, friends, neighbors, or groups?  Not at all    During the past week, to what extent has your arm, shoulder or hand problem limited your work or other regular daily activities  Slightly    Arm, shoulder, or hand pain.  Mild    Tingling (pins and needles) in your arm, shoulder, or hand  Mild    Difficulty Sleeping  No difficulty    DASH Score  18.18 %            [facility] Adult PT Treatment/Exercise - 04/19/17 0001      Shoulder Exercises: Pulleys   Flexion  2 minutes    Flexion Limitations  VCs to decrease scapular compensation     ABduction  2 minutes      Shoulder Exercises: Therapy Ball   Flexion  10 reps With forward lean into end of stretch    ABduction  10 reps Lt UE with same side lean into end of stretch      Manual Therapy   Manual Therapy  Manual Lymphatic Drainage (MLD)    Manual Lymphatic Drainage (MLD)  Educated pt and husband and had them return demonstrate: In Supine short neck, 5 diaphragmatic breaths, Rt axilla and Lt inguinal nodes, anterior inter-axillary and Rt axillo-inguinal anastomsis, then focused on Lt lateral-posterior upper trunk,  instructing pt in this and having her return demonstration; then into Rt S/L for posterior inter-axillary anastomosis having pt practice this..                   PT Long Term Goals - 04/19/17 1303      PT LONG TERM GOAL #1   Title  Patient will be independent in her home exercise program for shoulder ROM.    Baseline  Pt is independent with inital HEP for stretching and began postural strength today-04/13/17    Time  4    Period  Weeks    Status  Partially Met      PT LONG TERM GOAL #2   Title  Patient will demonstrate shoulder flexion AROM to be >/= 140 degrees for increased ease reaching overhead.    Baseline  65 degrees; 131 degrees - 04/13/17, 04/19/17- 130    Time  4    Period  Weeks    Status  On-going      PT LONG TERM GOAL #3   Title  Patient will demonstrate shoulder abduction AROM to be >/= 140 degrees for increased ease reaching overhead.    Baseline  52 degrees; 129 degrees - 04/13/17, 04/19/17- 155    Time  4    Period  Weeks    Status  Achieved      PT LONG TERM GOAL #4   Title  Patient will score </= 10 on the DASH indicating increased shoulder function.    Baseline  54.55, 04/19/17- 18.18    Time  4    Period  Weeks    Status  On-going      PT LONG TERM GOAL #5   Title  Patient will verbalize she understands lymphedema risk reduction.    Baseline  04/19/17- re educated pt in risk reduction handout    Time  4  Period  Weeks     Status  On-going      Breast Clinic Goals - 02/08/17 2024      Patient will be able to verbalize understanding of pertinent lymphedema risk reduction practices relevant to her diagnosis specifically related to skin care.   Status  Achieved      Patient will be able to return demonstrate and/or verbalize understanding of the post-op home exercise program related to regaining shoulder range of motion.   Status  Achieved      Patient will be able to verbalize understanding of the importance of attending the postoperative After Breast Cancer Class for further lymphedema risk reduction education and therapeutic exercise.   Status  Achieved           Plan - 04/19/17 1448    Clinical Impression Statement  Continued instructing pt's husband in manual lymphatic drainge today. He require much less verbal and tactile cues to perform correctly. He did not glide over skin today and demonstrated good speed. Assessed pt's progress towards goals. She is still limited with left shoulder ROM and required cueing for lymphedema risk reduction practices. She would benefit from additional skilled PT visits.     Rehab Potential  Excellent    PT Frequency  2x / week    PT Duration  4 weeks    PT Treatment/Interventions  ADLs/Self Care Home Management;Therapeutic exercise;Patient/family education;Orthotic Fit/Training;Manual techniques;Passive range of motion;Manual lymph drainage;Scar mobilization;DME Instruction    PT Next Visit Plan   Review supine scapular series and self manual lymph drainage, also reviewing with husband posterior inter-axillary anastomosis; cont myofascial release to cording and end P/ROM stretching.     PT Home Exercise Plan  post-op shoulder ROM exercises once okayed by surgeon, to 90 degrees only for one week, supine dowel flexion     Consulted and Agree with Plan of Care  Patient    Family Member Consulted  Husband       Patient will benefit from skilled therapeutic  intervention in order to improve the following deficits and impairments:  Postural dysfunction, Decreased knowledge of precautions, Decreased knowledge of use of DME, Impaired UE functional use, Decreased range of motion, Decreased strength, Increased fascial restricitons, Decreased scar mobility  Visit Diagnosis: Aftercare following surgery for neoplasm  Stiffness of left shoulder, not elsewhere classified  Abnormal posture     Problem List Patient Active Problem List   Diagnosis Date Noted  . Port-A-Cath in place 04/14/2017  . Stage II breast cancer, left ([place]) 02/23/2017  . Carcinoma of upper-outer quadrant of left breast in female, estrogen receptor positive ([place]) 02/10/2017  . Carcinoma of upper-inner quadrant of left breast in female, estrogen receptor positive ([place]) 02/10/2017  . Chest pain 07/18/2015  . Hypokalemia 07/18/2015  . Vertigo 07/18/2015  . LBBB (left bundle branch block) 05/28/2009  . AODM 12/21/2007  . Essential hypertension, benign 12/21/2007  . DIASTOLIC DYSFUNCTION 22/03/5425  . FEMORAL BRUIT, RIGHT 12/21/2007  . DYSPNEA 12/21/2007    Allyson Sabal [facility] 04/19/2017, 2:51 PM  [place], [place], [address] Phone: [phone]   Fax:  [phone]  Name: SHARNE LINDERS MRN: [id number] Date of Birth: April 17, 1933  Manus Gunning, PT 04/19/17 2:51 PM

## 2017-04-21 ENCOUNTER — Inpatient Hospital Stay: Payer: Medicare Other

## 2017-04-21 VITALS — BP 131/64 | HR 63 | Temp 97.8°F | Resp 18

## 2017-04-21 DIAGNOSIS — Z170 Estrogen receptor positive status [ER+]: Principal | ICD-10-CM

## 2017-04-21 DIAGNOSIS — Z95828 Presence of other vascular implants and grafts: Secondary | ICD-10-CM

## 2017-04-21 DIAGNOSIS — Z5111 Encounter for antineoplastic chemotherapy: Secondary | ICD-10-CM | POA: Diagnosis not present

## 2017-04-21 DIAGNOSIS — C50212 Malignant neoplasm of upper-inner quadrant of left female breast: Secondary | ICD-10-CM | POA: Diagnosis not present

## 2017-04-21 DIAGNOSIS — E871 Hypo-osmolality and hyponatremia: Secondary | ICD-10-CM | POA: Diagnosis not present

## 2017-04-21 DIAGNOSIS — R918 Other nonspecific abnormal finding of lung field: Secondary | ICD-10-CM | POA: Diagnosis not present

## 2017-04-21 DIAGNOSIS — C50412 Malignant neoplasm of upper-outer quadrant of left female breast: Secondary | ICD-10-CM | POA: Diagnosis not present

## 2017-04-21 LAB — CBC WITH DIFFERENTIAL (CANCER CENTER ONLY)
Basophils Absolute: 0 10*3/uL (ref 0.0–0.1)
Basophils Relative: 1 %
EOS ABS: 0.1 10*3/uL (ref 0.0–0.5)
EOS PCT: 5 %
HCT: 32.9 % — ABNORMAL LOW (ref 34.8–46.6)
HEMOGLOBIN: 11 g/dL — AB (ref 11.6–15.9)
Lymphocytes Relative: 20 %
Lymphs Abs: 0.5 10*3/uL — ABNORMAL LOW (ref 0.9–3.3)
MCH: 30.4 pg (ref 25.1–34.0)
MCHC: 33.3 g/dL (ref 31.5–36.0)
MCV: 91.2 fL (ref 79.5–101.0)
MONOS PCT: 7 %
Monocytes Absolute: 0.2 10*3/uL (ref 0.1–0.9)
NEUTROS PCT: 67 %
Neutro Abs: 1.8 10*3/uL (ref 1.5–6.5)
Platelet Count: 220 10*3/uL (ref 145–400)
RBC: 3.61 MIL/uL — ABNORMAL LOW (ref 3.70–5.45)
RDW: 13.4 % (ref 11.2–14.5)
WBC Count: 2.6 10*3/uL — ABNORMAL LOW (ref 3.9–10.3)

## 2017-04-21 LAB — CMP (CANCER CENTER ONLY)
ALBUMIN: 3.3 g/dL — AB (ref 3.5–5.0)
ALT: 19 U/L (ref 0–55)
AST: 16 U/L (ref 5–34)
Alkaline Phosphatase: 51 U/L (ref 40–150)
Anion gap: 8 (ref 3–11)
BUN: 8 mg/dL (ref 7–26)
CHLORIDE: 93 mmol/L — AB (ref 98–109)
CO2: 27 mmol/L (ref 22–29)
CREATININE: 0.81 mg/dL (ref 0.60–1.10)
Calcium: 9.2 mg/dL (ref 8.4–10.4)
GFR, Est AFR Am: >60 mL/min (ref >60)
GFR, Estimated: >60 mL/min (ref >60)
GLUCOSE: 197 mg/dL — AB (ref 70–140)
Potassium: 3.2 mmol/L — ABNORMAL LOW (ref 3.5–5.1)
SODIUM: 128 mmol/L — AB (ref 136–145)
Total Bilirubin: 0.5 mg/dL (ref 0.2–1.2)
Total Protein: 6.1 g/dL — ABNORMAL LOW (ref 6.4–8.3)

## 2017-04-21 MED ORDER — DIPHENHYDRAMINE HCL 50 MG/ML IJ SOLN
INTRAMUSCULAR | Status: AC
  Filled 2017-04-21: qty 1

## 2017-04-21 MED ORDER — ACETAMINOPHEN 325 MG PO TABS
650.0000 mg | ORAL_TABLET | Freq: Once | ORAL | Status: AC
  Administered 2017-04-21: 650 mg via ORAL

## 2017-04-21 MED ORDER — DEXAMETHASONE SODIUM PHOSPHATE 10 MG/ML IJ SOLN
INTRAMUSCULAR | Status: AC
  Filled 2017-04-21: qty 1

## 2017-04-21 MED ORDER — PALONOSETRON HCL INJECTION 0.25 MG/5ML
INTRAVENOUS | Status: AC
  Filled 2017-04-21: qty 5

## 2017-04-21 MED ORDER — SODIUM CHLORIDE 0.9% FLUSH
10.0000 mL | INTRAVENOUS | Status: DC | PRN
  Administered 2017-04-21: 10 mL
  Filled 2017-04-21: qty 10

## 2017-04-21 MED ORDER — DEXAMETHASONE SODIUM PHOSPHATE 10 MG/ML IJ SOLN
10.0000 mg | Freq: Once | INTRAMUSCULAR | Status: AC
  Administered 2017-04-21: 10 mg via INTRAVENOUS

## 2017-04-21 MED ORDER — SODIUM CHLORIDE 0.9 % IV SOLN
80.0000 mg/m2 | Freq: Once | INTRAVENOUS | Status: AC
  Administered 2017-04-21: 144 mg via INTRAVENOUS
  Filled 2017-04-21: qty 24

## 2017-04-21 MED ORDER — SODIUM CHLORIDE 0.9 % IV SOLN
20.0000 mg | Freq: Once | INTRAVENOUS | Status: AC
  Administered 2017-04-21: 20 mg via INTRAVENOUS
  Filled 2017-04-21: qty 2

## 2017-04-21 MED ORDER — FAMOTIDINE IN NACL 20-0.9 MG/50ML-% IV SOLN: 20.0000 mg | Freq: Once | INTRAVENOUS | Status: DC

## 2017-04-21 MED ORDER — HEPARIN SOD (PORK) LOCK FLUSH 100 UNIT/ML IV SOLN
500.0000 [IU] | Freq: Once | INTRAVENOUS | Status: AC | PRN
  Administered 2017-04-21: 500 [IU]
  Filled 2017-04-21: qty 5

## 2017-04-21 MED ORDER — SODIUM CHLORIDE 0.9 % IV SOLN
Freq: Once | INTRAVENOUS | Status: AC
  Administered 2017-04-21: 09:00:00 via INTRAVENOUS

## 2017-04-21 MED ORDER — DIPHENHYDRAMINE HCL 50 MG/ML IJ SOLN
50.0000 mg | Freq: Once | INTRAMUSCULAR | Status: AC
  Administered 2017-04-21: 50 mg via INTRAVENOUS

## 2017-04-21 MED ORDER — TRASTUZUMAB CHEMO 150 MG IV SOLR
150.0000 mg | Freq: Once | INTRAVENOUS | Status: AC
  Administered 2017-04-21: 150 mg via INTRAVENOUS
  Filled 2017-04-21: qty 7.14

## 2017-04-21 MED ORDER — ACETAMINOPHEN 325 MG PO TABS
ORAL_TABLET | ORAL | Status: AC
  Filled 2017-04-21: qty 2

## 2017-04-21 NOTE — Patient Instructions (Signed)
[facility] Discharge Instructions for Patients Receiving Chemotherapy  Today you received the following chemotherapy agents:  Herceptin and Taxol.  To help prevent nausea and vomiting after your treatment, we encourage you to take your nausea medication as directed.   If you develop nausea and vomiting that is not controlled by your nausea medication, call the clinic.   BELOW ARE SYMPTOMS THAT SHOULD BE REPORTED IMMEDIATELY:  *FEVER GREATER THAN 100.5 F  *CHILLS WITH OR WITHOUT FEVER  NAUSEA AND VOMITING THAT IS NOT CONTROLLED WITH YOUR NAUSEA MEDICATION  *UNUSUAL SHORTNESS OF BREATH  *UNUSUAL BRUISING OR BLEEDING  TENDERNESS IN MOUTH AND THROAT WITH OR WITHOUT PRESENCE OF ULCERS  *URINARY PROBLEMS  *BOWEL PROBLEMS  UNUSUAL RASH Items with * indicate a potential emergency and should be followed up as soon as possible.  Feel free to call the clinic should you have any questions or concerns. The clinic phone number is [phone].  Please show the CHEMO ALERT CARD at check-in to the Emergency Department and triage nurse.   

## 2017-04-23 ENCOUNTER — Ambulatory Visit: Payer: Medicare Other | Admitting: Physical Therapy

## 2017-04-23 DIAGNOSIS — M25612 Stiffness of left shoulder, not elsewhere classified: Secondary | ICD-10-CM

## 2017-04-23 DIAGNOSIS — R293 Abnormal posture: Secondary | ICD-10-CM | POA: Diagnosis not present

## 2017-04-23 DIAGNOSIS — Z483 Aftercare following surgery for neoplasm: Secondary | ICD-10-CM

## 2017-04-23 DIAGNOSIS — C50912 Malignant neoplasm of unspecified site of left female breast: Secondary | ICD-10-CM | POA: Diagnosis not present

## 2017-04-23 DIAGNOSIS — Z170 Estrogen receptor positive status [ER+]: Secondary | ICD-10-CM | POA: Diagnosis not present

## 2017-04-23 NOTE — Patient Instructions (Signed)
Over Head Pull: Narrow and Wide Grip   Cancer Rehab [phone]   On back, knees bent, feet flat, band across thighs, elbows straight but relaxed. Pull hands apart (start). Keeping elbows straight, bring arms up and over head, hands toward floor. Keep pull steady on band. Hold momentarily. Return slowly, keeping pull steady, back to start. Then do same with a wider grip on the band (past shoulder width) Repeat _5-10__ times. Band color __yellow_or red___   Side Pull: Double Arm   On back, knees bent, feet flat. Arms perpendicular to body, shoulder level, elbows straight but relaxed. Pull arms out to sides, elbows straight. Resistance band comes across collarbones, hands toward floor. Hold momentarily. Slowly return to starting position. Repeat _5-10__ times. Band color _yellow_or red___   Sword   On back, knees bent, feet flat, left hand on left hip, right hand above left. Pull right arm DIAGONALLY (hip to shoulder) across chest. Bring right arm along head toward floor. Hold momentarily. Slowly return to starting position. Repeat _5-10__ times. Do with left arm. Band color _yellow_or red____   Shoulder Rotation: Double Arm   On back, knees bent, feet flat, elbows tucked at sides, bent 90, hands palms up. Pull hands apart and down toward floor, keeping elbows near sides. Hold momentarily. Slowly return to starting position. Repeat _5-10__ times. Band color __yellow or red____    

## 2017-04-23 NOTE — Therapy (Signed)
[place], [place], [address] Phone: [phone]   Fax:  [phone]  Physical Therapy Treatment  Patient Details  Name: Penny Huynh MRN: [id number] Date of Birth: September 13, 1933 Referring Provider: [name]   Encounter Date: 04/23/2017  PT End of Session - 04/23/17 1033    Visit Number  8    Number of Visits  18    Date for PT Re-Evaluation  05/17/17    PT Start Time  0848    PT Stop Time  0932    PT Time Calculation (min)  44 min    Activity Tolerance  Patient tolerated treatment well    Behavior During Therapy  [facility] for tasks assessed/performed       Past Medical History:  Diagnosis Date  . Arthritis    "maybe a little in my left knee" (02/23/2017)  . Asthmatic bronchitis   . Cancer of left breast ([place])   . Coronary atherosclerosis    Minor nonobstructive CAD at cardiac catheterization 2009  . Dyslipidemia   . Femoral bruit 7/08   With normal ABIs  . Hypertension   . Lower extremity edema    Chronic  . Pneumonia ~ 1950   in 8th grade  . Type II diabetes mellitus ([place])     Past Surgical History:  Procedure Laterality Date  . APPENDECTOMY    . BREAST BIOPSY Left 02/2017  . CARDIAC CATHETERIZATION     "long time ago" (02/23/2017)  . MASTECTOMY COMPLETE / SIMPLE W/ SENTINEL NODE BIOPSY Left 02/23/2017   TOTAL MATECTOMY;  LEFT AXILLARY SENTINEL LYMPH NODE BIOPSY ERAS PATHWAY  . MASTECTOMY WITH RADIOACTIVE SEED GUIDED EXCISION AND AXILLARY SENTINEL LYMPH NODE BIOPSY Left 02/23/2017   Procedure: LEFT TOTAL MATECTOMY;  LEFT AXILLARY SENTINEL LYMPH NODE BIOPSY ERAS PATHWAY;  Surgeon: Excell Seltzer, MD;  Location: [place];  Service: General;  Laterality: Left;  . PILONIDAL CYST EXCISION    . PORTA CATH INSERTION  02/23/2017  . PORTACATH PLACEMENT N/A 02/23/2017   Procedure: INSERTION PORT-A-CATH;  Surgeon: Excell Seltzer, MD;  Location: [place];  Service: General;   Laterality: N/A;  . SHOULDER ARTHROSCOPY W/ ROTATOR CUFF REPAIR Right     There were no vitals filed for this visit.  Subjective Assessment - 04/23/17 0851    Subjective  So far so good.  I haven't had any problems really. Has had a couple episodes of bloody nose; also has had some headache.  Has talked to [name] and her primary care doctor, and they may be adjusting medication doses.    Pertinent History  Patient underwent a left mastectomy and sentinel node biopsy (6 negative nodes) on 02/23/17. She will begin chemotherapy 03/31/17 as long as drain is removed today. She has 2 different breast cancers. Both are ER/PR positive but one is HER2 negative and one is HER2 positive. History of right shoulder scope 20 years ago and has diabetes and hypertension, both of which are well controlled.    Currently in Pain?  No/denies         [facility] PT Assessment - 04/23/17 0001      Observation/Other Assessments   Observations  Therapist noted what looks like a bruise at left upper posterior arm and had patient look at this with a mirror.  She felt that it may not be a new skin discoloration.      Palpation   Palpation comment  cording observed and palpated in left axilla and  left elbow with left shoulder in abduction                  [facility] Adult PT Treatment/Exercise - 04/23/17 0001      Shoulder Exercises: Supine   Other Supine Exercises  reviewed supine scapular strengthening series with red Theraband, patient performing these 5x each.  Issued red Theraband.      Manual Therapy   Manual Therapy  Soft tissue mobilization    Soft tissue mobilization  tried to release cord at left elbow and axilla, but without palpable release achieved    Myofascial Release  crosshands at left axilla; release with one hand just inferior to elbow and other at upper outer breast     Manual Lymphatic Drainage (MLD)  Reviewed posterior interaxillary anastomosis left to right with patient in right sidelying,  having her husband perform that.    Passive ROM  In Supine into Lt shoulder flexion, abduction and er to pt's tolerance             PT Education - 04/23/17 1032    Education provided  Yes    Education Details  Reviewed posterior interaxillary anatomosis technique with patient's husband; instructed patient in supine scapular strengthening exercise with red Theraband    Person(s) Educated  Patient;Spouse    Methods  Explanation;Demonstration;Verbal cues;Handout    Comprehension  Verbalized understanding;Returned demonstration          PT Long Term Goals - 04/19/17 1303      PT LONG TERM GOAL #1   Title  Patient will be independent in her home exercise program for shoulder ROM.    Baseline  Pt is independent with inital HEP for stretching and began postural strength today-04/13/17    Time  4    Period  Weeks    Status  Partially Met      PT LONG TERM GOAL #2   Title  Patient will demonstrate shoulder flexion AROM to be >/= 140 degrees for increased ease reaching overhead.    Baseline  65 degrees; 131 degrees - 04/13/17, 04/19/17- 130    Time  4    Period  Weeks    Status  On-going      PT LONG TERM GOAL #3   Title  Patient will demonstrate shoulder abduction AROM to be >/= 140 degrees for increased ease reaching overhead.    Baseline  52 degrees; 129 degrees - 04/13/17, 04/19/17- 155    Time  4    Period  Weeks    Status  Achieved      PT LONG TERM GOAL #4   Title  Patient will score </= 10 on the DASH indicating increased shoulder function.    Baseline  54.55, 04/19/17- 18.18    Time  4    Period  Weeks    Status  On-going      PT LONG TERM GOAL #5   Title  Patient will verbalize she understands lymphedema risk reduction.    Baseline  04/19/17- re educated pt in risk reduction handout    Time  [facility] Goals - 02/08/17 2024      Patient will be able to verbalize understanding of pertinent lymphedema risk reduction  practices relevant to her diagnosis specifically related to skin care.   Status  Achieved      Patient will be able to return demonstrate and/or  verbalize understanding of the post-op home exercise program related to regaining shoulder range of motion.   Status  Achieved      Patient will be able to verbalize understanding of the importance of attending the postoperative After Breast Cancer Class for further lymphedema risk reduction education and therapeutic exercise.   Status  Achieved           Plan - 04/23/17 1033    Clinical Impression Statement  Pt.'s husband was able to perform posterior interaxillary anastomosis technique with demontration and cueing by PT. Pt. felt she could increase resistance on Theraband so was issued red Theraband and reviewed supine scapular series.  Still with very tight soft tissues including cording at left axilla and cording at left elbow. Unable to achieve releases of cording today.     Rehab Potential  Excellent    PT Frequency  2x / week    PT Duration  4 weeks    PT Treatment/Interventions  ADLs/Self Care Home Management;Therapeutic exercise;Patient/family education;Orthotic Fit/Training;Manual techniques;Passive range of motion;Manual lymph drainage;Scar mobilization;DME Instruction    PT Next Visit Plan  Focus more on soft tissue mobilization and myofascial release for cording of left axilla and elbow as well as left shoulder ROM.    PT Home Exercise Plan  focus on stretches to try to release cording; supine scapular series    Consulted and Agree with Plan of Care  Patient       Patient will benefit from skilled therapeutic intervention in order to improve the following deficits and impairments:  Postural dysfunction, Decreased knowledge of precautions, Decreased knowledge of use of DME, Impaired UE functional use, Decreased range of motion, Decreased strength, Increased fascial restricitons, Decreased scar mobility  Visit Diagnosis: Aftercare  following surgery for neoplasm  Stiffness of left shoulder, not elsewhere classified  Abnormal posture     Problem List Patient Active Problem List   Diagnosis Date Noted  . Port-A-Cath in place 04/14/2017  . Stage II breast cancer, left ([place]) 02/23/2017  . Carcinoma of upper-outer quadrant of left breast in female, estrogen receptor positive ([place]) 02/10/2017  . Carcinoma of upper-inner quadrant of left breast in female, estrogen receptor positive ([place]) 02/10/2017  . Chest pain 07/18/2015  . Hypokalemia 07/18/2015  . Vertigo 07/18/2015  . LBBB (left bundle branch block) 05/28/2009  . AODM 12/21/2007  . Essential hypertension, benign 12/21/2007  . DIASTOLIC DYSFUNCTION 43/92/6599  . FEMORAL BRUIT, RIGHT 12/21/2007  . DYSPNEA 12/21/2007    SALISBURY,DONNA 04/23/2017, 10:37 AM  [place], [place], [address] Phone: [phone]   Fax:  [phone]  Name: LIVANA YERIAN MRN: [id number] Date of Birth: 1933/10/01  Serafina Royals, PT 04/23/17 10:37 AM

## 2017-04-26 ENCOUNTER — Ambulatory Visit: Payer: Medicare Other | Admitting: Rehabilitation

## 2017-04-26 ENCOUNTER — Encounter: Payer: Self-pay | Admitting: Rehabilitation

## 2017-04-26 DIAGNOSIS — M25612 Stiffness of left shoulder, not elsewhere classified: Secondary | ICD-10-CM | POA: Diagnosis not present

## 2017-04-26 DIAGNOSIS — Z483 Aftercare following surgery for neoplasm: Secondary | ICD-10-CM | POA: Diagnosis not present

## 2017-04-26 DIAGNOSIS — R293 Abnormal posture: Secondary | ICD-10-CM

## 2017-04-26 DIAGNOSIS — C50912 Malignant neoplasm of unspecified site of left female breast: Secondary | ICD-10-CM

## 2017-04-26 DIAGNOSIS — Z170 Estrogen receptor positive status [ER+]: Secondary | ICD-10-CM

## 2017-04-26 NOTE — Therapy (Signed)
[place], [place], [address] Phone: [phone]   Fax:  [phone]  Physical Therapy Treatment  Patient Details  Name: Penny Huynh MRN: [id number] Date of Birth: Jul 08, 1933 Referring Provider: [name]   Encounter Date: 04/26/2017  PT End of Session - 04/26/17 1342    Visit Number  9    Number of Visits  18    Date for PT Re-Evaluation  05/17/17    PT Start Time  1300    PT Stop Time  1340    PT Time Calculation (min)  40 min    Activity Tolerance  Patient tolerated treatment well    Behavior During Therapy  [facility] for tasks assessed/performed       Past Medical History:  Diagnosis Date  . Arthritis    "maybe a little in my left knee" (02/23/2017)  . Asthmatic bronchitis   . Cancer of left breast ([place])   . Coronary atherosclerosis    Minor nonobstructive CAD at cardiac catheterization 2009  . Dyslipidemia   . Femoral bruit 7/08   With normal ABIs  . Hypertension   . Lower extremity edema    Chronic  . Pneumonia ~ 1950   in 8th grade  . Type II diabetes mellitus ([place])     Past Surgical History:  Procedure Laterality Date  . APPENDECTOMY    . BREAST BIOPSY Left 02/2017  . CARDIAC CATHETERIZATION     "long time ago" (02/23/2017)  . MASTECTOMY COMPLETE / SIMPLE W/ SENTINEL NODE BIOPSY Left 02/23/2017   TOTAL MATECTOMY;  LEFT AXILLARY SENTINEL LYMPH NODE BIOPSY ERAS PATHWAY  . MASTECTOMY WITH RADIOACTIVE SEED GUIDED EXCISION AND AXILLARY SENTINEL LYMPH NODE BIOPSY Left 02/23/2017   Procedure: LEFT TOTAL MATECTOMY;  LEFT AXILLARY SENTINEL LYMPH NODE BIOPSY ERAS PATHWAY;  Surgeon: Excell Seltzer, MD;  Location: [place];  Service: General;  Laterality: Left;  . PILONIDAL CYST EXCISION    . PORTA CATH INSERTION  02/23/2017  . PORTACATH PLACEMENT N/A 02/23/2017   Procedure: INSERTION PORT-A-CATH;  Surgeon: Excell Seltzer, MD;  Location: [place];  Service: General;   Laterality: N/A;  . SHOULDER ARTHROSCOPY W/ ROTATOR CUFF REPAIR Right     There were no vitals filed for this visit.  Subjective Assessment - 04/26/17 1301    Subjective  feeling fine today.  just the tightness.  Feels like her husband has the MLD under control.  Has been starting to feel hoarse starting yesterday     Pertinent History  Patient underwent a left mastectomy and sentinel node biopsy (6 negative nodes) on 02/23/17. She will begin chemotherapy 03/31/17 as long as drain is removed today. She has 2 different breast cancers. Both are ER/PR positive but one is HER2 negative and one is HER2 positive. History of right shoulder scope 20 years ago and has diabetes and hypertension, both of which are well controlled.    Currently in Pain?  No/denies                No data recorded       [facility] Adult PT Treatment/Exercise - 04/26/17 0001      Shoulder Exercises: Supine   Other Supine Exercises  red band supine horizontal abd x 10, overhead flexion x 10, and bil ER x 10      Shoulder Exercises: Standing   Row  20 reps;Theraband;Both    Theraband Level (Shoulder Row)  Level 2 (Red)      Manual  Therapy   Manual Therapy  Joint mobilization    Joint Mobilization  Lt GH grade IV- 3x20"    Soft tissue mobilization  Left axilla and upper arm in ER and abudction as tolerated.  work towards incision scar    Passive ROM  in supine to tolerance with focus on flexion, abduction, and ER and various angles; prolonged holds included                  PT Long Term Goals - 04/19/17 1303      PT LONG TERM GOAL #1   Title  Patient will be independent in her home exercise program for shoulder ROM.    Baseline  Pt is independent with inital HEP for stretching and began postural strength today-04/13/17    Time  4    Period  Weeks    Status  Partially Met      PT LONG TERM GOAL #2   Title  Patient will demonstrate shoulder flexion AROM to be >/= 140 degrees for increased ease  reaching overhead.    Baseline  65 degrees; 131 degrees - 04/13/17, 04/19/17- 130    Time  4    Period  Weeks    Status  On-going      PT LONG TERM GOAL #3   Title  Patient will demonstrate shoulder abduction AROM to be >/= 140 degrees for increased ease reaching overhead.    Baseline  52 degrees; 129 degrees - 04/13/17, 04/19/17- 155    Time  4    Period  Weeks    Status  Achieved      PT LONG TERM GOAL #4   Title  Patient will score </= 10 on the DASH indicating increased shoulder function.    Baseline  54.55, 04/19/17- 18.18    Time  4    Period  Weeks    Status  On-going      PT LONG TERM GOAL #5   Title  Patient will verbalize she understands lymphedema risk reduction.    Baseline  04/19/17- re educated pt in risk reduction handout    Time  [facility] Goals - 02/08/17 2024      Patient will be able to verbalize understanding of pertinent lymphedema risk reduction practices relevant to her diagnosis specifically related to skin care.   Status  Achieved      Patient will be able to return demonstrate and/or verbalize understanding of the post-op home exercise program related to regaining shoulder range of motion.   Status  Achieved      Patient will be able to verbalize understanding of the importance of attending the postoperative After Breast Cancer Class for further lymphedema risk reduction education and therapeutic exercise.   Status  Achieved           Plan - 04/26/17 1335    Clinical Impression Statement  Pt reports husband has a good knowledge of MLD and did not need to re do today.  Wanting to focus on the drawing feeling at the shoulder.  Added standing scapular row and tolerated treatment well.  Some popping during treatment but hard to tell origin.  Reports feeling much looser post treatment.      Rehab Potential  Excellent    PT Frequency  2x / week    PT Duration  4 weeks    PT Treatment/Interventions  ADLs/Self Care Home Management;Therapeutic exercise;Patient/family education;Orthotic Fit/Training;Manual techniques;Passive range of motion;Manual lymph drainage;Scar mobilization;DME Instruction    PT Next Visit Plan  Focus more on soft tissue mobilization and myofascial release for cording of left axilla and elbow as well as left shoulder ROM.    PT Home Exercise Plan  focus on stretches to try to release cording; supine scapular series    Consulted and Agree with Plan of Care  Patient       Patient will benefit from skilled therapeutic intervention in order to improve the following deficits and impairments:  Postural dysfunction, Decreased knowledge of precautions, Decreased knowledge of use of DME, Impaired UE functional use, Decreased range of motion, Decreased strength, Increased fascial restricitons, Decreased scar mobility  Visit Diagnosis: Aftercare following surgery for neoplasm  Stiffness of left shoulder, not elsewhere classified  Abnormal posture  Malignant neoplasm of left breast in female, estrogen receptor positive, unspecified site of breast [facility])     Problem List Patient Active Problem List   Diagnosis Date Noted  . Port-A-Cath in place 04/14/2017  . Stage II breast cancer, left ([place]) 02/23/2017  . Carcinoma of upper-outer quadrant of left breast in female, estrogen receptor positive ([place]) 02/10/2017  . Carcinoma of upper-inner quadrant of left breast in female, estrogen receptor positive ([place]) 02/10/2017  . Chest pain 07/18/2015  . Hypokalemia 07/18/2015  . Vertigo 07/18/2015  . LBBB (left bundle branch block) 05/28/2009  . AODM 12/21/2007  . Essential hypertension, benign 12/21/2007  . DIASTOLIC DYSFUNCTION 17/83/7542  . FEMORAL BRUIT, RIGHT 12/21/2007  . DYSPNEA 12/21/2007    Shan Levans, PT  04/26/2017, 1:43 PM  [place], [place], [address] Phone: [phone]   Fax:   [phone]  Name: IVELISSE CULVERHOUSE MRN: [id number] Date of Birth: 04/09/1933

## 2017-04-27 NOTE — Progress Notes (Signed)
[place]  [phone] Fax:([phone]) [phone]  Clinic Follow up Note   Patient Care Team: Manon Hilding, MD as PCP - General (Cardiology) Truitt Merle, MD as Consulting Physician (Hematology) Excell Seltzer, MD as Consulting Physician (General Surgery) Alla Feeling, NP as Nurse Practitioner (Nurse Practitioner) [name], MD as Attending Physician (Cardiology)   Date of Service:  04/28/2017  SUMMARY OF ONCOLOGIC HISTORY: Cancer Staging Carcinoma of upper-inner quadrant of left breast in female, estrogen receptor positive ([place]) Staging form: Breast, AJCC 8th Edition - Clinical stage from 01/18/2017: Stage IB (cT2, cN0, cM0, G3, ER: Positive, PR: Positive, HER2: Positive) - Signed by Truitt Merle, MD on 02/10/2017 - Pathologic stage from 02/23/2017: Stage IB (pT3, pN0, cM0, G3, ER: Positive, PR: Positive, HER2: Positive) - Signed by Alla Feeling, NP on 03/16/2017  Carcinoma of upper-outer quadrant of left breast in female, estrogen receptor positive ([place]) Staging form: Breast, AJCC 8th Edition - Clinical stage from 01/18/2017: Stage IB (cT2, cN0, cM0, G2, ER: Positive, PR: Positive, HER2: Negative) - Unsigned - Pathologic stage from 02/23/2017: Stage IB (pT2, pN0(sn), cM0, G3, ER: Positive, PR: Positive, HER2: Negative) - Signed by Alla Feeling, NP on 03/16/2017  Oncology History   Cancer Staging Carcinoma of upper-inner quadrant of left breast in female, estrogen receptor positive ([place]) Staging form: Breast, AJCC 8th Edition - Clinical stage from 01/18/2017: Stage IB (cT2, cN0, cM0, G3, ER: Positive, PR: Positive, HER2: Positive) - Signed by Truitt Merle, MD on 02/10/2017 - Pathologic stage from 02/23/2017: Stage IB (pT3, pN0, cM0, G3, ER: Positive, PR: Positive, HER2: Positive) - Signed by Alla Feeling, NP on 03/16/2017  Carcinoma of upper-outer quadrant of left breast in female, estrogen receptor positive ([place]) Staging form: Breast, AJCC 8th  Edition - Clinical stage from 01/18/2017: Stage IB (cT2, cN0, cM0, G2, ER: Positive, PR: Positive, HER2: Negative) - Unsigned - Pathologic stage from 02/23/2017: Stage IB (pT2, pN0(sn), cM0, G3, ER: Positive, PR: Positive, HER2: Negative) - Signed by Alla Feeling, NP on 03/16/2017       Carcinoma of upper-outer quadrant of left breast in female, estrogen receptor positive ([place])   01/13/2017 Mammogram    FINDINGS: In the left breast, a spiculated mass lies in the upper outer quadrant. In the medial posterior left breast, there is a larger lobulated mass. These masses correspond to the palpable abnormalities. No other discrete left breast masses.  In the right breast, there are no discrete masses. There are no areas of architectural distortion.  In both breasts there are scattered calcifications, rounded punctate, with a few other larger coarse dystrophic calcifications, without significant change from the previous screening mammogram, which is dated 12/04/2009.  Mammographic images were processed with CAD.  On physical exam, there is a firm palpable mass in the upper outer left breast and another firm mass in the medial left breast.       01/13/2017 Breast US    Targeted ultrasound is performed, showing an irregular hypoechoic shadowing mass in the left breast at the 2:30 o'clock position, 5 cm the nipple, measuring 3.3 x 2.8 x 2.6 cm. In the 10:30 o'clock position of the left breast, 5 cm the nipple, there is irregular hypoechoic mass with somewhat more lobulated margins, corresponding to the lobulated mass seen mammographically. This measures 4.8 x 2.9 x 4.8 cm. Both masses show internal vascularity on color Doppler analysis.  Sonographic evaluation of the left axilla shows several nodes with thickened cortices. Status cortex  measured is 5 mm. None of these lymph nodes appear enlarged but overall size criteria.  IMPRESSION: 1. Two masses in the left breast, 1 at  the 2:30 o'clock position in the other at the 10:30 o'clock position, both highly suspicious for breast carcinoma. 2. Several abnormal left axillary lymph nodes with thickened cortices. 3. No evidence of right breast malignancy.       01/18/2017 Pathology Results    Diagnosis 1. Breast, left, needle core biopsy, 2:30 o'clock - INVASIVE MAMMARY CARCINOMA - MAMMARY CARCINOMA IN-SITU - SEE COMMENT 2. Lymph node, needle/core biopsy, left axilla - NO CARCINOMA IDENTIFIED - SEE COMMENT 3. Breast, left, needle core biopsy, 10:30 o'clock - INVASIVE DUCTAL CARCINOMA - SEE COMMENT  1. PROGNOSTIC INDICATORS Results: IMMUNOHISTOCHEMICAL AND MORPHOMETRIC ANALYSIS PERFORMED MANUALLY Estrogen Receptor: 100%, POSITIVE, STRONG STAINING INTENSITY Progesterone Receptor: 60%, POSITIVE, STRONG STAINING INTENSITY Proliferation Marker Ki67: 60%  1. FLUORESCENCE IN-SITU HYBRIDIZATION Results: HER2 - NEGATIVE RATIO OF HER2/CEP17 SIGNALS 1.28 AVERAGE HER2 COPY NUMBER PER CELL 2.75  3. PROGNOSTIC INDICATORS Results: IMMUNOHISTOCHEMICAL AND MORPHOMETRIC ANALYSIS PERFORMED MANUALLY Estrogen Receptor: 100%, POSITIVE, STRONG STAINING INTENSITY Progesterone Receptor: 50%, POSITIVE, STRONG STAINING INTENSITY Proliferation Marker Ki67: 70%  3. FLUORESCENCE IN-SITU HYBRIDIZATION Results: HER2 - **POSITIVE** RATIO OF HER2/CEP17 SIGNALS 2.80 AVERAGE HER2 COPY NUMBER PER CELL 6.15  Microscopic Comment 1. The biopsy material shows an infiltrative proliferation of cells with large vesicular nuclei with inconspicuous nucleoli, arranged linearly and in small clusters. Based on the biopsy, the carcinoma appears Nottingham grade 2 of 3 and measures 0.9 cm in greatest linear extent. E-cadherin and prognostic markers (ER/PR/ki-67/HER2-FISH)are pending and will be reported in an addendum. Dr. Lyndon Code reviewed the case and agrees with the above diagnosis.      02/10/2017 Initial Diagnosis    Carcinoma of  upper-outer quadrant of left breast in female, estrogen receptor positive ([place])      02/19/2017 Imaging    Bone Scan whole Body 02/19/17 IMPRESSION: Negative for evidence of bony metastatic disease.      02/19/2017 Imaging    CT CAP W Contrast 02/19/17 IMPRESSION: 1. Small right middle lobe pulmonary nodules up to 0.8 cm, nonspecific. Non-contrast chest CT at 3-6 months is recommended. If the nodules are stable at time of repeat CT, then future CT at 18-24 months (from today's scan) is considered optional for low-risk patients, but is recommended for high-risk patients. This recommendation follows the consensus statement: Guidelines for Management of Incidental Pulmonary Nodules Detected on CT Images: From the Fleischner Society 2017; Radiology 2017; 284:228-243. 2. Subcentimeter hepatic lesions likely cysts but incompletely characterized due to small size. Attention on follow-up imaging recommended. 3. 2.1 cm fusiform right common iliac artery aneurysm. Continued surveillance recommended. 4. Aortic Atherosclerosis (ICD10-I70.0) and Emphysema (ICD10-J43.9).      02/23/2017 Pathology Results    Diagnosis 1. Breast, simple mastectomy, Left Total - INVASIVE DUCTAL CARCINOMA, MULTIFOCAL, NOTTINGHAM GRADE 3/3 (5.3 CM, 3.5 CM) - INVASIVE CARCINOMA INVOLVES THE DERMIS - DUCTAL CARCINOMA IN SITU, INTERMEDIATE GRADE - HYALINIZED FIBROADENOMA - MARGINS UNINVOLVED BY CARCINOMA - NO CARCINOMA IDENTIFIED IN TWO LYMPH NODES (0/2) - SEE ONCOLOGY TABLE AND COMMENT BELOW 2. Lymph node, sentinel, biopsy, Left Axillary - NO CARCINOMA IDENTIFIED IN ONE LYMPH NODE (0/1) 3. Lymph node, sentinel, biopsy - NO CARCINOMA IDENTIFIED IN ONE LYMPH NODE (0/1) 4. Lymph node, sentinel, biopsy - NO CARCINOMA IDENTIFIED IN ONE LYMPH NODE (0/1) 5. Lymph node, sentinel, biopsy - NO CARCINOMA IDENTIFIED IN ONE LYMPH NODE (0/1) Microscopic Comment 1. BREAST, INVASIVE  TUMOR Procedure: Simple  mastectomy Laterality: Left Tumor Size: 5. 3 cm, 3.5 cm Histologic Type: Invasive carcinoma of no special type (ductal, not otherwise specified) Grade: Nottingham Grade 3 Tubular Differentiation: 3 Nuclear Pleomorphism: 3 Mitotic Count: 2 Ductal Carcinoma in Situ (DCIS): Present Extent of Tumor: Skin: Invasive carcinoma directly invades into the dermis or epidermis without skin ulceration Margins: Invasive carcinoma, distance from closest margin: 1.5 cm (posterior) DCIS, distance from closest margin: > 1 cm Regional Lymph Nodes: Number of Lymph Nodes Examined: 6 Number of Sentinel Lymph Nodes Examined: 4 Lymph Nodes with Macrometastases: 0 Lymph Nodes with Micrometastases: 0 Lymph Nodes with Isolated Tumor Cells: 0 Treatment effect: No known presurgical therapy Breast Prognostic Profile: See Also ([id number]) Estrogen Receptor: Positive (100%, strong); Positive (100%, strong) Progesterone Receptor: Positive (50%, strong); Positive (50%, strong) Her2: Positive (Ratio 2.80); Negative (Ratio 1.28) Ki-67: 70%; 60% Best tumor block for sendout testing: 1E Pathologic Stage Classification (pTNM, AJCC 8th Edition): Primary Tumor: mpT3 Regional Lymph Nodes: pN0 COMMENT: The two invasive carcinomas in the breast have slightly different morphologies. The larger lesion has a papillary and micropapillary pattern admixed with typical ductal carcinoma while the smaller lesion is more typical of a ductal lesion with linear arrays (E-cadherin performed on biopsy). There are foci within the larger lesion that are concerning for lymphovascular space invasion.       04/07/2017 -  Chemotherapy    weekly taxol and herceptin x12 weeks then herceptin q3 weeks for total 1 year      HISTORY OF PRESENTING ILLNESS:  Penny Huynh 84 y.o. female is here because of the diagnosed left breast cancer, she presents with her husband.  The patient self palpated a left breast mass around 6 months ago, she  thought it was calcium deposit as her mother had a history of this.  There was no pain at first but has become intermittently tender which prompted her to seek medical attention with her PCP.  She reports the palpable mass nearly doubled in that time period. No associated breast swelling or nipple discharge.  She reports a decreased appetite lately, denies fatigue or weight loss.  Diagnostic imaging shows an irregular hypoechoic mass in the left breast at the 2:30 o'clock position, 5 cm from the nipple, measuring 3.3 x 2.8 x 2.6 cm.  In the 10:30 o'clock position of the left breast, 5 cm from the nipple, there is irregular hypoechoic mass measuring 4.8 x 2.9 x 4.8 cm.  Ultrasound of the left axilla shows cortical thickening but no enlarged lymph nodes.  Left breast mass at 2:30 o'clock position is positive for invasive mammary carcinoma with mammary carcinoma in situ, ER/PR positive, HER-2 negative; left breast mass at 10:30 o'clock position is positive for invasive ductal carcinoma, ER/PR positive, HER-2 positive.  The biopsied lymph node was negative for carcinoma.  She was then referred to [name] for surgical consult.  Today she reports feeling well with intermittent productive nagging cough with clear phlegm; no wheezing or dyspnea.  Has occasional burning in hands and feet but not affecting function, she attributes to long-term diabetes.  Denies breast or general pain, as well.  Denies palpitations, chest pain, or lower extremity edema.  In the past she has history of knee pain, hypertension, hyperlipidemia, CAD, and DM.  In the 1960s she was told she had an enlarged heart but was never told she had a diagnosis of heart failure, no MI or stroke.  She is followed by cardiologist Dr. Bronson Ing. Last  echo in 2013 with EF 60-65% and grade I diastolic dysfunction.   She is retired from [employer] but has been recently enrolled in college courses to further her education. She  previously exercised 6 days per week but decreased exercise over the last 6 months. She also works at [employer] during tax season. Lives at home with her spouse. She is independent of all ADLs, drives. She drinks alcohol occasionally; former smoker who quit in 2001, previously smoked 2 PPD x46 years.   GYN HISTORY  Menarchal: age [age] LMP: age 63 Contraceptive: none HRT: none GP: G2P0, has 2 grown (foster) children  CURRENT THERAPY:  weekly taxol x12 weeks beginning 04/07/17 then herceptin for total 1 year   INTERVAL HISTORY:  KHRYSTAL JEANMARIE returns for follow up and weekly Taxol and Herceptin. She presents to the clinic today accompanied by her husband. She notes she is doing well with weekly Taxol. She does have some days with low energy and fatigue but she does not feel that is it bad. She state she had a rash on her hand that resolved and a small rash on her buttocks, She is using hydrocortisone cream. She also notes her voice is becoming hoarse. She does have mild constipation.   On review of systems, pt denies nausea, vomiting, neuropathy, diarrhea or any other complaints at this time. Pertinent positives are listed and detailed within the above HPI.   REVIEW OF SYSTEMS:   Constitutional: Denies fatigue, fevers, chills or abnormal weight loss (+) mild fatigue Eyes: Denies blurriness of vision Ears, nose, mouth, throat, and face: Denies mucositis or sore throat (+) voice hoarseness Respiratory: Denies cough, dyspnea or wheezes Cardiovascular: Denies palpitation, chest discomfort or lower extremity swelling Gastrointestinal:  Denies nausea, vomiting, diarrhea, heartburn (+) constipation Skin: (+) rash to her buttocks  Lymphatics: Denies new lymphadenopathy or easy bruising Neurological:Denies tingling or new weaknesses   Behavioral/Psych: Mood is stable, no new changes  Breast: (+) s/p left mastectomy  All other systems were reviewed with the patient and are negative.  MEDICAL  HISTORY:  Past Medical History:  Diagnosis Date  . Arthritis    "maybe a little in my left knee" (02/23/2017)  . Asthmatic bronchitis   . Cancer of left breast ([place])   . Coronary atherosclerosis    Minor nonobstructive CAD at cardiac catheterization 2009  . Dyslipidemia   . Femoral bruit 7/08   With normal ABIs  . Hypertension   . Lower extremity edema    Chronic  . Pneumonia ~ 1950   in 8th grade  . Type II diabetes mellitus ([place])     SURGICAL HISTORY: Past Surgical History:  Procedure Laterality Date  . APPENDECTOMY    . BREAST BIOPSY Left 02/2017  . CARDIAC CATHETERIZATION     "long time ago" (02/23/2017)  . MASTECTOMY COMPLETE / SIMPLE W/ SENTINEL NODE BIOPSY Left 02/23/2017   TOTAL MATECTOMY;  LEFT AXILLARY SENTINEL LYMPH NODE BIOPSY ERAS PATHWAY  . MASTECTOMY WITH RADIOACTIVE SEED GUIDED EXCISION AND AXILLARY SENTINEL LYMPH NODE BIOPSY Left 02/23/2017   Procedure: LEFT TOTAL MATECTOMY;  LEFT AXILLARY SENTINEL LYMPH NODE BIOPSY ERAS PATHWAY;  Surgeon: Excell Seltzer, MD;  Location: [facility];  Service: General;  Laterality: Left;  . PILONIDAL CYST EXCISION    . PORTA CATH INSERTION  02/23/2017  . PORTACATH PLACEMENT N/A 02/23/2017   Procedure: INSERTION PORT-A-CATH;  Surgeon: Excell Seltzer, MD;  Location: [facility];  Service: General;  Laterality: N/A;  . SHOULDER ARTHROSCOPY W/  ROTATOR CUFF REPAIR Right     I have reviewed the social history and family history with the patient and they are unchanged from previous note.  ALLERGIES:  has No Known Allergies.  MEDICATIONS:  Current Outpatient Medications  Medication Sig Dispense Refill  . aspirin 81 MG EC tablet Take 81 mg by mouth at bedtime.     . chlorthalidone (HYGROTON) 25 MG tablet Take 25 mg by mouth daily.     . Coenzyme Q10 (COQ10) 100 MG CAPS Take 100 mg by mouth daily.      Marland Kitchen latanoprost (XALATAN) 0.005 % ophthalmic solution Place 1 drop into both eyes at bedtime.    . lidocaine-prilocaine (EMLA) cream Apply  to affected area once 30 g 3  . losartan (COZAAR) 50 MG tablet Take 50 mg by mouth daily.    . metFORMIN (GLUCOPHAGE) 500 MG tablet Take 1,000 mg by mouth daily with breakfast.     . ondansetron (ZOFRAN) 8 MG tablet Take 1 tablet (8 mg total) by mouth 2 (two) times daily as needed (Nausea or vomiting). 30 tablet 1  . prochlorperazine (COMPAZINE) 10 MG tablet Take 1 tablet (10 mg total) by mouth every 6 (six) hours as needed (Nausea or vomiting). 30 tablet 1  . traMADol (ULTRAM) 50 MG tablet Take 1 tablet (50 mg total) by mouth every 6 (six) hours as needed (mild pain). 15 tablet 0  . vitamin B-12 (CYANOCOBALAMIN) 1000 MCG tablet Take 1,000 mcg by mouth daily.     No current facility-administered medications for this visit.    Facility-Administered Medications Ordered in Other Visits  Medication Dose Route Frequency Provider Last Rate Last Dose  . PACLitaxel (TAXOL) 144 mg in sodium chloride 0.9 % 250 mL chemo infusion (</= 27m/m2)  80 mg/m2 (Treatment Plan Recorded) Intravenous Once FTruitt Merle MD 274 mL/hr at 04/28/17 1151 144 mg at 04/28/17 1151    PHYSICAL EXAMINATION: ECOG PERFORMANCE STATUS: 1 - Symptomatic but completely ambulatory  Vitals:   04/28/17 0847  BP: (!) 185/71  Pulse: 70  Resp: 18  Temp: 99 F (37.2 C)  SpO2: 100%   Filed Weights   04/28/17 0847  Weight: 152 lb 11.2 oz (69.3 kg)    GENERAL:alert, no distress and comfortable SKIN: skin color, texture, turgor are normal   EYES: normal, Conjunctiva are pink and non-injected, sclera clear OROPHARYNX:no exudate, no erythema and lips, buccal mucosa, and tongue normal  NECK: supple, thyroid normal size, non-tender, without nodularity LYMPH:  no palpable cervical, supraclavicular, or axillary lymphadenopathy  LUNGS: clear to auscultation bilaterally with normal breathing effort HEART: regular rate & rhythm and no murmurs and no lower extremity edema ABDOMEN:abdomen soft, non-tender and normal bowel  sounds Musculoskeletal:no cyanosis of digits and no clubbing  NEURO: alert & oriented x 3 with fluent speech, no focal motor/sensory deficits BREASTS: s/p left mastectomy, surgical incision healing well PAC without erythema  LABORATORY DATA:  I have reviewed the data as listed CBC Latest Ref Rng & Units 04/28/2017 04/21/2017 04/14/2017  WBC 3.9 - 10.3 K/uL 2.5(L) 2.6(L) 4.8  Hemoglobin 12.0 - 15.0 g/dL - - -  Hematocrit 34.8 - 46.6 % 32.2(L) 32.9(L) 32.5(L)  Platelets 145 - 400 K/uL 256 220 233     CMP Latest Ref Rng & Units 04/28/2017 04/21/2017 04/14/2017  Glucose 70 - 140 mg/dL 143(H) 197(H) 106  BUN 7 - 26 mg/dL _0 Creatinine 0.60 - 1.10 mg/dL 0.73 0.81 0.77  Sodium 136 - 145 mmol/L 127(L) 128(L)  126(L)  Potassium 3.5 - 5.1 mmol/L 3.6 3.2(L) 3.6  Chloride 98 - 109 mmol/L 93(L) 93(L) 92(L)  CO2 22 - 29 mmol/L _0 Calcium 8.4 - 10.4 mg/dL 9.0 9.2 9.3  Total Protein 6.4 - 8.3 g/dL 6.2(L) 6.1(L) 6.0(L)  Total Bilirubin 0.2 - 1.2 mg/dL 0.5 0.5 0.8  Alkaline Phos 40 - 150 U/L 51 51 50  AST 5 - 34 U/L _1 ALT 0 - 55 U/L _2 ANC 1.4K today   PATHOLOGY  LEFT SIMPLE MASTECTOMY WITH SNLB 02/23/17 per [name] Diagnosis 1. Breast, simple mastectomy, Left Total - INVASIVE DUCTAL CARCINOMA, MULTIFOCAL, NOTTINGHAM GRADE 3/3 (5.3 CM, 3.5 CM) - INVASIVE CARCINOMA INVOLVES THE DERMIS - DUCTAL CARCINOMA IN SITU, INTERMEDIATE GRADE - HYALINIZED FIBROADENOMA - MARGINS UNINVOLVED BY CARCINOMA - NO CARCINOMA IDENTIFIED IN TWO LYMPH NODES (0/2) - SEE ONCOLOGY TABLE AND COMMENT BELOW 2. Lymph node, sentinel, biopsy, Left Axillary - NO CARCINOMA IDENTIFIED IN ONE LYMPH NODE (0/1) 3. Lymph node, sentinel, biopsy - NO CARCINOMA IDENTIFIED IN ONE LYMPH NODE (0/1) 4. Lymph node, sentinel, biopsy - NO CARCINOMA IDENTIFIED IN ONE LYMPH NODE (0/1) 5. Lymph node, sentinel, biopsy - NO CARCINOMA IDENTIFIED IN ONE LYMPH NODE (0/1) Microscopic Comment 1. BREAST, INVASIVE  TUMOR Procedure: Simple mastectomy Laterality: Left Tumor Size: 5. 3 cm, 3.5 cm Histologic Type: Invasive carcinoma of no special type (ductal, not otherwise specified) Grade: Nottingham Grade 3 Tubular Differentiation: 3 Nuclear Pleomorphism: 3 Mitotic Count: 2 Ductal Carcinoma in Situ (DCIS): Present Extent of Tumor: Skin: Invasive carcinoma directly invades into the dermis or epidermis without skin ulceration Margins: Invasive carcinoma, distance from closest margin: 1.5 cm (posterior) DCIS, distance from closest margin: > 1 cm Regional Lymph Nodes: Number of Lymph Nodes Examined: 6 Number of Sentinel Lymph Nodes Examined: 4 Lymph Nodes with Macrometastases: 0 Lymph Nodes with Micrometastases: 0 Lymph Nodes with Isolated Tumor Cells: 0 Treatment effect: No known presurgical therapy Breast Prognostic Profile: See Also ([id number]) Estrogen Receptor: Positive (100%, strong); Positive (100%, strong) Progesterone Receptor: Positive (50%, strong); Positive (50%, strong) Her2: Positive (Ratio 2.80); Negative (Ratio 1.28) Ki-67: 70%; 60% Best tumor block for sendout testing: 1E Pathologic Stage Classification (pTNM, AJCC 8th Edition): Primary Tumor: mpT3 Regional Lymph Nodes: pN0 COMMENT: The two invasive carcinomas in the breast have slightly different morphologies. The larger lesion has a papillary and micropapillary pattern admixed with typical ductal carcinoma while the smaller lesion is more typical of a ductal lesion with linear arrays (E-cadherin performed on biopsy). There are foci within the larger lesion that are concerning for lymphovascular space invasion.   02/17/17 ECHO: Study Conclusions - Left ventricle: The cavity size was normal. Wall thickness was   increased in a pattern of mild LVH. Systolic function was normal.   The estimated ejection fraction was in the range of 55% to 60%.   Normal GLS at -20%. Wall motion was normal; there were no   regional wall motion  abnormalities. Doppler parameters are   consistent with abnormal left ventricular relaxation (grade 1   diastolic dysfunction). The E/e&' ratio is between 8-15,   suggesting indeterminate LV filling pressure. - Aortic valve: Sclerosis without stenosis. - Aorta: Mildly dilated aorta. Aortic root dimension: 39 mm (ED).   Ascending aortic diameter: 38 mm (S). - Left atrium: The atrium was normal in size. - Inferior vena cava: The vessel was normal in size. The   respirophasic diameter changes were in the normal range (>=  50%),   consistent with normal central venous pressure. Impressions: - Compared to a prior study in 2013, the LVEF is slightly lower but   normal at 55-60%. There is mild LVH, normal GLS at -20%, grade 1   DD and indeterminate LV filling pressure. The aorta is mildly   dilated at 3.8-3.9 cm.   RADIOGRAPHIC STUDIES: I have personally reviewed the radiological images as listed and agreed with the findings in the report. No results found.    Bone Scan whole Body 02/19/17 IMPRESSION: Negative for evidence of bony metastatic disease.   CT CAP W Contrast 02/19/17 IMPRESSION: 1. Small right middle lobe pulmonary nodules up to 0.8 cm, nonspecific. Non-contrast chest CT at 3-6 months is recommended. If the nodules are stable at time of repeat CT, then future CT at 18-24 months (from today's scan) is considered optional for low-risk patients, but is recommended for high-risk patients. This recommendation follows the consensus statement: Guidelines for Management of Incidental Pulmonary Nodules Detected on CT Images: From the Fleischner Society 2017; Radiology 2017; 284:228-243. 2. Subcentimeter hepatic lesions likely cysts but incompletely characterized due to small size. Attention on follow-up imaging recommended. 3. 2.1 cm fusiform right common iliac artery aneurysm. Continued surveillance recommended. 4. Aortic Atherosclerosis (ICD10-I70.0) and Emphysema  (ICD10-J43.9).   ASSESSMENT & PLAN: Jamekia Gannett is an 84 year old with past medical history positive for HTN, DM, CAD now with newly diagnosed left breast cancer  1.  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   -We previously reviewed her pathology results in detail, she had multifocal invasive ductal carcinoma, G3, the upper inner quadrant focus spanning 5.3 cm, ER+/PR+/HER2+; the upper outer quadrant focus spanned 3.5 cm, ER+/PR+/HER2- node-negative.  -We previously discussed the high risk of recurrence due to HER-2 positive T3 disease, I recommended her to consider adjuvant chemotherapy.  Due to her advanced age, will recommend weekly Taxol and Herceptin, followed by Herceptin maintenance therapy for a total of 1 year. She began this regimen on 04/07/17. She has been tolerating treatment well overall  -she had echo 02/17/17 which shows normal EF 55-60% with mild LVH and grade 1 DD; I sent message to her cardiologist to follow while on herceptin; will repeat echo q3 months while on therapy. Will repeat ECHO in 07/2017.  -Draining tube removed 04/02/17. She has recovered well from surgery and has started rehab.  -I previously recommended her to consider dipping her hands and feet in cold bag during Taxol infusion to reduce her risk of neuropathy. She opted to try her hands with first cycle treatment. I will notify her infusion nurse.  -Labs reviewed, her blood counts are low, they are adequate to proceed with Taxol and Herceptin today. She will take next week off for her to recover. I advised her to avoid big crowds and practice good  hygiene.  -F/u in 3 weeks   2. HTN, CAD, DM -continue chlorthalidone and losartan for BP, metformin for DM -We reviewed DM can worsen while on chemotherapy especially with steroid administration; will monitor closely.  -I recommended she eat well and be active within limitations of post-op recovery.   3. Skin rash  -  She had red raised pruritic rash confined to her upper chest previously, appears to be allergic reaction. I recommended she apply topical steroid cream such as hydrocortisone PRN and continue benadryl spray for itching.  -resolved  4.  Hyponatremia -Na 126 on 04/14/17, does usually run low in low 130's.  Not on SSRI.  On chlorthalidone diuretic for HTN. Lacie recommended she hold this medication and increase salt intake.  She will call PCP to see if adjustment needs to be made in losartan while off diuretic. Encouraged to monitor her BP. She is asymptomatic; will monitor closely. -if she is drinking adequately, it is okay to continue the chlorthalidone for now. If she becomes dehydrated in the future, I may hold this   5.  Neutropenia, continue chemotherapy -He has developed mild neutropenia from chemotherapy, ANC 1.4K today -We will skip her chemo next week, and resume in 2 weeks. -If she has persistent neutropenia, or consider G-CSF   PLAN: -Labs reviewed, her blood counts are low, they are adequate to proceed with Taxol and Herceptin today. She will take next week off for her to recover, and resume in 2 weeks. -Lab and f/u in 3 weeks    All questions were answered. The patient knows to call the clinic with any problems, questions or concerns. No barriers to learning was detected.  I spent  20 minutes counseling the patient face to face. The total time spent in the appointment was 25 minutes and more than 50% was on counseling and review of test results   This document serves as a record of services personally performed by Truitt Merle, MD. It was created on her behalf by Theresia Bough, a trained medical scribe. The creation of this record is based on the scribe's personal observations and the provider's statements to them.   I have reviewed the above documentation for accuracy and completeness, and I agree with the above.    Truitt Merle, MD 04/28/2017 12:44 PM

## 2017-04-28 ENCOUNTER — Encounter: Payer: Self-pay | Admitting: Hematology

## 2017-04-28 ENCOUNTER — Inpatient Hospital Stay: Payer: Medicare Other

## 2017-04-28 ENCOUNTER — Telehealth: Payer: Self-pay

## 2017-04-28 ENCOUNTER — Inpatient Hospital Stay (HOSPITAL_BASED_OUTPATIENT_CLINIC_OR_DEPARTMENT_OTHER): Payer: Medicare Other | Admitting: Hematology

## 2017-04-28 VITALS — BP 185/71 | HR 70 | Temp 99.0°F | Resp 18 | Ht 64.5 in | Wt 152.7 lb

## 2017-04-28 DIAGNOSIS — Z79899 Other long term (current) drug therapy: Secondary | ICD-10-CM | POA: Diagnosis not present

## 2017-04-28 DIAGNOSIS — Z170 Estrogen receptor positive status [ER+]: Principal | ICD-10-CM

## 2017-04-28 DIAGNOSIS — C50412 Malignant neoplasm of upper-outer quadrant of left female breast: Secondary | ICD-10-CM

## 2017-04-28 DIAGNOSIS — Z95828 Presence of other vascular implants and grafts: Secondary | ICD-10-CM

## 2017-04-28 DIAGNOSIS — Z9012 Acquired absence of left breast and nipple: Secondary | ICD-10-CM

## 2017-04-28 DIAGNOSIS — R918 Other nonspecific abnormal finding of lung field: Secondary | ICD-10-CM

## 2017-04-28 DIAGNOSIS — C50212 Malignant neoplasm of upper-inner quadrant of left female breast: Secondary | ICD-10-CM

## 2017-04-28 DIAGNOSIS — E871 Hypo-osmolality and hyponatremia: Secondary | ICD-10-CM

## 2017-04-28 DIAGNOSIS — Z5111 Encounter for antineoplastic chemotherapy: Secondary | ICD-10-CM | POA: Diagnosis not present

## 2017-04-28 LAB — CMP (CANCER CENTER ONLY)
ALT: 19 U/L (ref 0–55)
AST: 15 U/L (ref 5–34)
Albumin: 3.3 g/dL — ABNORMAL LOW (ref 3.5–5.0)
Alkaline Phosphatase: 51 U/L (ref 40–150)
Anion gap: 8 (ref 3–11)
BILIRUBIN TOTAL: 0.5 mg/dL (ref 0.2–1.2)
BUN: 8 mg/dL (ref 7–26)
CALCIUM: 9 mg/dL (ref 8.4–10.4)
CO2: 26 mmol/L (ref 22–29)
CREATININE: 0.73 mg/dL (ref 0.60–1.10)
Chloride: 93 mmol/L — ABNORMAL LOW (ref 98–109)
Glucose, Bld: 143 mg/dL — ABNORMAL HIGH (ref 70–140)
Potassium: 3.6 mmol/L (ref 3.5–5.1)
Sodium: 127 mmol/L — ABNORMAL LOW (ref 136–145)
TOTAL PROTEIN: 6.2 g/dL — AB (ref 6.4–8.3)

## 2017-04-28 LAB — CBC WITH DIFFERENTIAL (CANCER CENTER ONLY)
BASOS ABS: 0.1 10*3/uL (ref 0.0–0.1)
Basophils Relative: 2 %
EOS PCT: 4 %
Eosinophils Absolute: 0.1 10*3/uL (ref 0.0–0.5)
HEMATOCRIT: 32.2 % — AB (ref 34.8–46.6)
Hemoglobin: 10.8 g/dL — ABNORMAL LOW (ref 11.6–15.9)
LYMPHS ABS: 0.6 10*3/uL — AB (ref 0.9–3.3)
LYMPHS PCT: 24 %
MCH: 30.5 pg (ref 25.1–34.0)
MCHC: 33.5 g/dL (ref 31.5–36.0)
MCV: 91 fL (ref 79.5–101.0)
Monocytes Absolute: 0.3 10*3/uL (ref 0.1–0.9)
Monocytes Relative: 13 %
NEUTROS ABS: 1.4 10*3/uL — AB (ref 1.5–6.5)
Neutrophils Relative %: 57 %
Platelet Count: 256 10*3/uL (ref 145–400)
RBC: 3.54 MIL/uL — ABNORMAL LOW (ref 3.70–5.45)
RDW: 12.9 % (ref 11.2–14.5)
WBC Count: 2.5 10*3/uL — ABNORMAL LOW (ref 3.9–10.3)

## 2017-04-28 MED ORDER — ACETAMINOPHEN 325 MG PO TABS
ORAL_TABLET | ORAL | Status: AC
  Filled 2017-04-28: qty 2

## 2017-04-28 MED ORDER — TRASTUZUMAB CHEMO 150 MG IV SOLR
150.0000 mg | Freq: Once | INTRAVENOUS | Status: AC
  Administered 2017-04-28: 150 mg via INTRAVENOUS
  Filled 2017-04-28: qty 7.1

## 2017-04-28 MED ORDER — SODIUM CHLORIDE 0.9% FLUSH
10.0000 mL | INTRAVENOUS | Status: DC | PRN
  Administered 2017-04-28: 10 mL
  Filled 2017-04-28: qty 10

## 2017-04-28 MED ORDER — FAMOTIDINE IN NACL 20-0.9 MG/50ML-% IV SOLN
INTRAVENOUS | Status: AC
Start: 2017-04-28 — End: ?
  Filled 2017-04-28: qty 50

## 2017-04-28 MED ORDER — DIPHENHYDRAMINE HCL 50 MG/ML IJ SOLN
50.0000 mg | Freq: Once | INTRAMUSCULAR | Status: AC
  Administered 2017-04-28: 50 mg via INTRAVENOUS

## 2017-04-28 MED ORDER — DIPHENHYDRAMINE HCL 50 MG/ML IJ SOLN
INTRAMUSCULAR | Status: AC
  Filled 2017-04-28: qty 1

## 2017-04-28 MED ORDER — HEPARIN SOD (PORK) LOCK FLUSH 100 UNIT/ML IV SOLN
500.0000 [IU] | Freq: Once | INTRAVENOUS | Status: AC | PRN
  Administered 2017-04-28: 500 [IU]
  Filled 2017-04-28: qty 5

## 2017-04-28 MED ORDER — ACETAMINOPHEN 325 MG PO TABS
650.0000 mg | ORAL_TABLET | Freq: Once | ORAL | Status: AC
  Administered 2017-04-28: 650 mg via ORAL

## 2017-04-28 MED ORDER — DEXAMETHASONE SODIUM PHOSPHATE 10 MG/ML IJ SOLN
10.0000 mg | Freq: Once | INTRAMUSCULAR | Status: AC
  Administered 2017-04-28: 10 mg via INTRAVENOUS

## 2017-04-28 MED ORDER — SODIUM CHLORIDE 0.9 % IV SOLN
80.0000 mg/m2 | Freq: Once | INTRAVENOUS | Status: AC
  Administered 2017-04-28: 144 mg via INTRAVENOUS
  Filled 2017-04-28: qty 24

## 2017-04-28 MED ORDER — FAMOTIDINE IN NACL 20-0.9 MG/50ML-% IV SOLN
20.0000 mg | Freq: Once | INTRAVENOUS | Status: AC
  Administered 2017-04-28: 20 mg via INTRAVENOUS

## 2017-04-28 MED ORDER — SODIUM CHLORIDE 0.9 % IV SOLN
Freq: Once | INTRAVENOUS | Status: AC
  Administered 2017-04-28: 10:00:00 via INTRAVENOUS

## 2017-04-28 MED ORDER — DEXAMETHASONE SODIUM PHOSPHATE 10 MG/ML IJ SOLN
INTRAMUSCULAR | Status: AC
  Filled 2017-04-28: qty 1

## 2017-04-28 NOTE — Patient Instructions (Signed)
[facility] Discharge Instructions for Patients Receiving Chemotherapy  Today you received the following chemotherapy agents :  Herceptin,  Taxol.  To help prevent nausea and vomiting after your treatment, we encourage you to take your nausea medication as prescribed.   If you develop nausea and vomiting that is not controlled by your nausea medication, call the clinic.   BELOW ARE SYMPTOMS THAT SHOULD BE REPORTED IMMEDIATELY:  *FEVER GREATER THAN 100.5 F  *CHILLS WITH OR WITHOUT FEVER  NAUSEA AND VOMITING THAT IS NOT CONTROLLED WITH YOUR NAUSEA MEDICATION  *UNUSUAL SHORTNESS OF BREATH  *UNUSUAL BRUISING OR BLEEDING  TENDERNESS IN MOUTH AND THROAT WITH OR WITHOUT PRESENCE OF ULCERS  *URINARY PROBLEMS  *BOWEL PROBLEMS  UNUSUAL RASH Items with * indicate a potential emergency and should be followed up as soon as possible.  Feel free to call the clinic should you have any questions or concerns. The clinic phone number is [phone].  Please show the CHEMO ALERT CARD at check-in to the Emergency Department and triage nurse.   

## 2017-04-28 NOTE — Telephone Encounter (Signed)
Printed avs and calender of upcoming appointment. Per 3/27 los 

## 2017-04-30 ENCOUNTER — Encounter: Payer: Self-pay | Admitting: Physical Therapy

## 2017-04-30 ENCOUNTER — Ambulatory Visit: Payer: Medicare Other | Admitting: Physical Therapy

## 2017-04-30 DIAGNOSIS — Z170 Estrogen receptor positive status [ER+]: Secondary | ICD-10-CM | POA: Diagnosis not present

## 2017-04-30 DIAGNOSIS — Z483 Aftercare following surgery for neoplasm: Secondary | ICD-10-CM

## 2017-04-30 DIAGNOSIS — C50912 Malignant neoplasm of unspecified site of left female breast: Secondary | ICD-10-CM | POA: Diagnosis not present

## 2017-04-30 DIAGNOSIS — R293 Abnormal posture: Secondary | ICD-10-CM | POA: Diagnosis not present

## 2017-04-30 DIAGNOSIS — M25612 Stiffness of left shoulder, not elsewhere classified: Secondary | ICD-10-CM | POA: Diagnosis not present

## 2017-04-30 NOTE — Therapy (Addendum)
[place], [place], [address] Phone: [phone]   Fax:  [phone]  Physical Therapy Treatment  Patient Details  Name: Penny Huynh MRN: [id number] Date of Birth: 28-Aug-1933 Referring Provider: [name]  Progress Note Reporting Period 02/08/2017 to 04/30/2017  See note below for Objective Data and Assessment of Progress/Goals.   Annia Friendly, [place] 12/21/17 2:51 PM     Encounter Date: 04/30/2017  PT End of Session - 04/30/17 1206    Visit Number  10    Number of Visits  18    Date for PT Re-Evaluation  05/17/17    PT Start Time  1100    PT Stop Time  1145    PT Time Calculation (min)  45 min    Activity Tolerance  Patient tolerated treatment well    Behavior During Therapy  [facility] for tasks assessed/performed       Past Medical History:  Diagnosis Date  . Arthritis    "maybe a little in my left knee" (02/23/2017)  . Asthmatic bronchitis   . Cancer of left breast ([place])   . Coronary atherosclerosis    Minor nonobstructive CAD at cardiac catheterization 2009  . Dyslipidemia   . Femoral bruit 7/08   With normal ABIs  . Hypertension   . Lower extremity edema    Chronic  . Pneumonia ~ 1950   in 8th grade  . Type II diabetes mellitus ([place])     Past Surgical History:  Procedure Laterality Date  . APPENDECTOMY    . BREAST BIOPSY Left 02/2017  . CARDIAC CATHETERIZATION     "long time ago" (02/23/2017)  . MASTECTOMY COMPLETE / SIMPLE W/ SENTINEL NODE BIOPSY Left 02/23/2017   TOTAL MATECTOMY;  LEFT AXILLARY SENTINEL LYMPH NODE BIOPSY ERAS PATHWAY  . MASTECTOMY WITH RADIOACTIVE SEED GUIDED EXCISION AND AXILLARY SENTINEL LYMPH NODE BIOPSY Left 02/23/2017   Procedure: LEFT TOTAL MATECTOMY;  LEFT AXILLARY SENTINEL LYMPH NODE BIOPSY ERAS PATHWAY;  Surgeon: Excell Seltzer, MD;  Location: [facility];  Service: General;  Laterality: Left;  . PILONIDAL CYST EXCISION    . PORTA CATH  INSERTION  02/23/2017  . PORTACATH PLACEMENT N/A 02/23/2017   Procedure: INSERTION PORT-A-CATH;  Surgeon: Excell Seltzer, MD;  Location: [facility];  Service: General;  Laterality: N/A;  . SHOULDER ARTHROSCOPY W/ ROTATOR CUFF REPAIR Right     There were no vitals filed for this visit.  Subjective Assessment - 04/30/17 1107    Subjective  "I lost my hair"  Pt comes in wearing a compression bra that she got on Monday.  She has been wearing it 24/7  she still feels some cording down her arm, but feels that the firm area in her axilla is better     Pertinent History  Patient underwent a left mastectomy and sentinel node biopsy (6 negative nodes) on 02/23/17. She will begin chemotherapy 03/31/17 as long as drain is removed today. She has 2 different breast cancers. Both are ER/PR positive but one is HER2 negative and one is HER2 positive. History of right shoulder scope 20 years ago and has diabetes and hypertension, both of which are well controlled.    Patient Stated Goals  See if my arm is ok    Currently in Pain?  No/denies                No data recorded       [facility] Adult PT Treatment/Exercise - 04/30/17 0001  Self-Care   Self-Care  Other Self-Care Comments    Other Self-Care Comments   added small piece of white cellulon to left axilla to wear inside bra  Gave pt referral for a Class 1 sleeve from [facility] to be billed to [facility].  She will go over to have it measured       Exercises   Exercises  Other Exercises    Other Exercises   Pt wants to return to her  exercise class that she was doing with her friends at a chuch prior to diagnosis.  Encouraged her to return, to do what she can and to start with lifting no weight, just doing the exercises actively       Shoulder Exercises: Sidelying   ABduction  AROM;Left;5 reps    Other Sidelying Exercises  small circles with hand pointed to ceiling       Manual Therapy   Myofascial Release  crosshands at left axilla;  release with one hand just inferior to elbow and other at upper outer breast  also along arm at areas of cording     Manual Lymphatic Drainage (MLD)  In Supine short neck, 5 diaphragmatic breaths, Rt axilla and Lt inguinal nodes, anterior inter-axillary and Rt axillo-inguinal anastomsis, then focused on Lt lateral-posterior upper trunk,  instructing pt in this and having her return demonstration; then into Rt S/L for posterior anastamosis.                   PT Long Term Goals - 04/19/17 1303      PT LONG TERM GOAL #1   Title  Patient will be independent in her home exercise program for shoulder ROM.    Baseline  Pt is independent with inital HEP for stretching and began postural strength today-04/13/17    Time  4    Period  Weeks    Status  Partially Met      PT LONG TERM GOAL #2   Title  Patient will demonstrate shoulder flexion AROM to be >/= 140 degrees for increased ease reaching overhead.    Baseline  65 degrees; 131 degrees - 04/13/17, 04/19/17- 130    Time  4    Period  Weeks    Status  On-going      PT LONG TERM GOAL #3   Title  Patient will demonstrate shoulder abduction AROM to be >/= 140 degrees for increased ease reaching overhead.    Baseline  52 degrees; 129 degrees - 04/13/17, 04/19/17- 155    Time  4    Period  Weeks    Status  Achieved      PT LONG TERM GOAL #4   Title  Patient will score </= 10 on the DASH indicating increased shoulder function.    Baseline  54.55, 04/19/17- 18.18    Time  4    Period  Weeks    Status  On-going      PT LONG TERM GOAL #5   Title  Patient will verbalize she understands lymphedema risk reduction.    Baseline  04/19/17- re educated pt in risk reduction handout    Time  4    Period  Weeks    Status  On-going      Breast Clinic Goals - 02/08/17 2024      Patient will be able to verbalize understanding of pertinent lymphedema risk reduction practices relevant to her diagnosis specifically related to skin care.   Status   Achieved        Patient will be able to return demonstrate and/or verbalize understanding of the post-op home exercise program related to regaining shoulder range of motion.   Status  Achieved      Patient will be able to verbalize understanding of the importance of attending the postoperative After Breast Cancer Class for further lymphedema risk reduction education and therapeutic exercise.   Status  Achieved           Plan - 04/30/17 1206    Clinical Impression Statement  Pt states she is doing well overall, but still has pulling down her left arm at times.  She has been wearing her compression bra and the fullness at left lateral chest just distal to axilla seems smaller.  She still has significant cord in left axilla with some guitar strings palpable down left arm especailly with arm is on stretch in sidelying. Gave pr referral to get a class one sleeve from [facility]     Rehab Potential  Excellent    PT Frequency  2x / week    PT Duration  4 weeks    PT Treatment/Interventions  ADLs/Self Care Home Management;Therapeutic exercise;Patient/family education;Orthotic Fit/Training;Manual techniques;Passive range of motion;Manual lymph drainage;Scar mobilization;DME Instruction    PT Next Visit Plan  Focus more on soft tissue mobilization and myofascial release for cording of left axilla and elbow as well as left shoulder ROM. Check to see if pt got compression sleeve    PT Home Exercise Plan  focus on stretches to try to release cording; supine scapular series  Pt wants to return to her previous exercise program at her church        Patient will benefit from skilled therapeutic intervention in order to improve the following deficits and impairments:  Postural dysfunction, Decreased knowledge of precautions, Decreased knowledge of use of DME, Impaired UE functional use, Decreased range of motion, Decreased strength, Increased fascial restricitons, Decreased scar mobility  Visit  Diagnosis: Aftercare following surgery for neoplasm  Stiffness of left shoulder, not elsewhere classified  Abnormal posture     Problem List Patient Active Problem List   Diagnosis Date Noted  . Port-A-Cath in place 04/14/2017  . Stage II breast cancer, left (HCC) 02/23/2017  . Carcinoma of upper-outer quadrant of left breast in female, estrogen receptor positive (HCC) 02/10/2017  . Carcinoma of upper-inner quadrant of left breast in female, estrogen receptor positive (HCC) 02/10/2017  . Chest pain 07/18/2015  . Hypokalemia 07/18/2015  . Vertigo 07/18/2015  . LBBB (left bundle branch block) 05/28/2009  . AODM 12/21/2007  . Essential hypertension, benign 12/21/2007  . DIASTOLIC DYSFUNCTION 12/21/2007  . FEMORAL BRUIT, RIGHT 12/21/2007  . DYSPNEA 12/21/2007   Teresa K. Brown, PT  Brown, Teresa Krall 04/30/2017, 12:15 PM  [facility] [address] [place], [place], [address] Phone: [phone]   Fax:  [phone]  Name: Penny Huynh MRN: [id number] Date of Birth: 02/19/1933   

## 2017-05-04 ENCOUNTER — Telehealth: Payer: Self-pay | Admitting: *Deleted

## 2017-05-04 NOTE — Telephone Encounter (Signed)
"  I missed a call.  What was call in reference to?"  No call documentation at this time.  Provided next scheduled appointment information May 12, 2017 with scheduled arrival time 11:45 am for registration.  Scheduled for lab/flush/treatment.   Denies further needs or questions.

## 2017-05-05 ENCOUNTER — Other Ambulatory Visit: Payer: Medicare Other

## 2017-05-05 ENCOUNTER — Ambulatory Visit: Payer: Medicare Other

## 2017-05-06 ENCOUNTER — Ambulatory Visit: Payer: Medicare Other | Admitting: Cardiovascular Disease

## 2017-05-12 ENCOUNTER — Inpatient Hospital Stay: Payer: Medicare Other | Attending: Nurse Practitioner

## 2017-05-12 ENCOUNTER — Inpatient Hospital Stay: Payer: Medicare Other

## 2017-05-12 ENCOUNTER — Ambulatory Visit: Payer: Medicare Other

## 2017-05-12 ENCOUNTER — Ambulatory Visit: Payer: Medicare Other | Admitting: Hematology

## 2017-05-12 ENCOUNTER — Encounter: Payer: Self-pay | Admitting: Nurse Practitioner

## 2017-05-12 ENCOUNTER — Other Ambulatory Visit: Payer: Medicare Other

## 2017-05-12 ENCOUNTER — Inpatient Hospital Stay (HOSPITAL_BASED_OUTPATIENT_CLINIC_OR_DEPARTMENT_OTHER): Payer: Medicare Other | Admitting: Nurse Practitioner

## 2017-05-12 VITALS — BP 142/71 | HR 67 | Temp 98.1°F | Resp 18 | Wt 155.5 lb

## 2017-05-12 DIAGNOSIS — E871 Hypo-osmolality and hyponatremia: Secondary | ICD-10-CM

## 2017-05-12 DIAGNOSIS — C50412 Malignant neoplasm of upper-outer quadrant of left female breast: Secondary | ICD-10-CM

## 2017-05-12 DIAGNOSIS — T451X5A Adverse effect of antineoplastic and immunosuppressive drugs, initial encounter: Secondary | ICD-10-CM

## 2017-05-12 DIAGNOSIS — Z5111 Encounter for antineoplastic chemotherapy: Secondary | ICD-10-CM | POA: Diagnosis not present

## 2017-05-12 DIAGNOSIS — M1990 Unspecified osteoarthritis, unspecified site: Secondary | ICD-10-CM | POA: Diagnosis not present

## 2017-05-12 DIAGNOSIS — Z9012 Acquired absence of left breast and nipple: Secondary | ICD-10-CM

## 2017-05-12 DIAGNOSIS — C50212 Malignant neoplasm of upper-inner quadrant of left female breast: Secondary | ICD-10-CM | POA: Insufficient documentation

## 2017-05-12 DIAGNOSIS — I10 Essential (primary) hypertension: Secondary | ICD-10-CM | POA: Diagnosis not present

## 2017-05-12 DIAGNOSIS — I700 Atherosclerosis of aorta: Secondary | ICD-10-CM | POA: Diagnosis not present

## 2017-05-12 DIAGNOSIS — Z79899 Other long term (current) drug therapy: Secondary | ICD-10-CM | POA: Insufficient documentation

## 2017-05-12 DIAGNOSIS — Z170 Estrogen receptor positive status [ER+]: Secondary | ICD-10-CM | POA: Diagnosis not present

## 2017-05-12 DIAGNOSIS — R918 Other nonspecific abnormal finding of lung field: Secondary | ICD-10-CM | POA: Diagnosis not present

## 2017-05-12 DIAGNOSIS — G620 Drug-induced polyneuropathy: Secondary | ICD-10-CM

## 2017-05-12 DIAGNOSIS — I890 Lymphedema, not elsewhere classified: Secondary | ICD-10-CM | POA: Insufficient documentation

## 2017-05-12 DIAGNOSIS — E876 Hypokalemia: Secondary | ICD-10-CM | POA: Insufficient documentation

## 2017-05-12 DIAGNOSIS — K769 Liver disease, unspecified: Secondary | ICD-10-CM

## 2017-05-12 DIAGNOSIS — I723 Aneurysm of iliac artery: Secondary | ICD-10-CM | POA: Insufficient documentation

## 2017-05-12 DIAGNOSIS — E119 Type 2 diabetes mellitus without complications: Secondary | ICD-10-CM | POA: Insufficient documentation

## 2017-05-12 DIAGNOSIS — Z95828 Presence of other vascular implants and grafts: Secondary | ICD-10-CM

## 2017-05-12 DIAGNOSIS — J439 Emphysema, unspecified: Secondary | ICD-10-CM | POA: Diagnosis not present

## 2017-05-12 DIAGNOSIS — R51 Headache: Secondary | ICD-10-CM

## 2017-05-12 DIAGNOSIS — R519 Headache, unspecified: Secondary | ICD-10-CM

## 2017-05-12 LAB — CMP (CANCER CENTER ONLY)
ALK PHOS: 58 U/L (ref 40–150)
ALT: 15 U/L (ref 0–55)
ANION GAP: 8 (ref 3–11)
AST: 16 U/L (ref 5–34)
Albumin: 3.2 g/dL — ABNORMAL LOW (ref 3.5–5.0)
BUN: 7 mg/dL (ref 7–26)
CALCIUM: 9.3 mg/dL (ref 8.4–10.4)
CO2: 27 mmol/L (ref 22–29)
Chloride: 96 mmol/L — ABNORMAL LOW (ref 98–109)
Creatinine: 0.72 mg/dL (ref 0.60–1.10)
GFR, Estimated: >60 mL/min (ref >60)
Glucose, Bld: 109 mg/dL (ref 70–140)
Potassium: 4 mmol/L (ref 3.5–5.1)
Sodium: 131 mmol/L — ABNORMAL LOW (ref 136–145)
Total Bilirubin: 0.3 mg/dL (ref 0.2–1.2)
Total Protein: 6.2 g/dL — ABNORMAL LOW (ref 6.4–8.3)

## 2017-05-12 LAB — CBC WITH DIFFERENTIAL (CANCER CENTER ONLY)
Basophils Absolute: 0 10*3/uL (ref 0.0–0.1)
Basophils Relative: 1 %
EOS ABS: 0.1 10*3/uL (ref 0.0–0.5)
Eosinophils Relative: 2 %
HEMATOCRIT: 31.8 % — AB (ref 34.8–46.6)
HEMOGLOBIN: 10.6 g/dL — AB (ref 11.6–15.9)
LYMPHS ABS: 1 10*3/uL (ref 0.9–3.3)
Lymphocytes Relative: 27 %
MCH: 30.5 pg (ref 25.1–34.0)
MCHC: 33.3 g/dL (ref 31.5–36.0)
MCV: 91.5 fL (ref 79.5–101.0)
MONOS PCT: 12 %
Monocytes Absolute: 0.4 10*3/uL (ref 0.1–0.9)
NEUTROS ABS: 2.1 10*3/uL (ref 1.5–6.5)
NEUTROS PCT: 58 %
Platelet Count: 328 10*3/uL (ref 145–400)
RBC: 3.47 MIL/uL — ABNORMAL LOW (ref 3.70–5.45)
RDW: 13.7 % (ref 11.2–14.5)
WBC Count: 3.6 10*3/uL — ABNORMAL LOW (ref 3.9–10.3)

## 2017-05-12 MED ORDER — FAMOTIDINE IN NACL 20-0.9 MG/50ML-% IV SOLN
20.0000 mg | Freq: Once | INTRAVENOUS | Status: AC
  Administered 2017-05-12: 20 mg via INTRAVENOUS

## 2017-05-12 MED ORDER — DIPHENHYDRAMINE HCL 50 MG/ML IJ SOLN
INTRAMUSCULAR | Status: AC
  Filled 2017-05-12: qty 1

## 2017-05-12 MED ORDER — FAMOTIDINE IN NACL 20-0.9 MG/50ML-% IV SOLN
INTRAVENOUS | Status: AC
  Filled 2017-05-12: qty 50

## 2017-05-12 MED ORDER — DIPHENHYDRAMINE HCL 25 MG PO CAPS
ORAL_CAPSULE | ORAL | Status: AC
  Filled 2017-05-12: qty 1

## 2017-05-12 MED ORDER — DEXAMETHASONE SODIUM PHOSPHATE 100 MG/10ML IJ SOLN
20.0000 mg | Freq: Once | INTRAMUSCULAR | Status: AC
  Administered 2017-05-12: 20 mg via INTRAVENOUS
  Filled 2017-05-12: qty 2

## 2017-05-12 MED ORDER — SODIUM CHLORIDE 0.9 % IV SOLN
80.0000 mg/m2 | Freq: Once | INTRAVENOUS | Status: AC
  Administered 2017-05-12: 144 mg via INTRAVENOUS
  Filled 2017-05-12: qty 24

## 2017-05-12 MED ORDER — ACETAMINOPHEN 325 MG PO TABS
ORAL_TABLET | ORAL | Status: AC
  Filled 2017-05-12: qty 2

## 2017-05-12 MED ORDER — HEPARIN SOD (PORK) LOCK FLUSH 100 UNIT/ML IV SOLN
500.0000 [IU] | Freq: Once | INTRAVENOUS | Status: AC | PRN
  Administered 2017-05-12: 500 [IU]
  Filled 2017-05-12: qty 5

## 2017-05-12 MED ORDER — SODIUM CHLORIDE 0.9% FLUSH
10.0000 mL | INTRAVENOUS | Status: DC | PRN
  Administered 2017-05-12: 10 mL
  Filled 2017-05-12: qty 10

## 2017-05-12 MED ORDER — DIPHENHYDRAMINE HCL 50 MG/ML IJ SOLN
50.0000 mg | Freq: Once | INTRAMUSCULAR | Status: AC
  Administered 2017-05-12: 50 mg via INTRAVENOUS

## 2017-05-12 MED ORDER — ACETAMINOPHEN 325 MG PO TABS
650.0000 mg | ORAL_TABLET | Freq: Once | ORAL | Status: AC
  Administered 2017-05-12: 650 mg via ORAL

## 2017-05-12 MED ORDER — SODIUM CHLORIDE 0.9 % IV SOLN
150.0000 mg | Freq: Once | INTRAVENOUS | Status: AC
  Administered 2017-05-12: 150 mg via INTRAVENOUS
  Filled 2017-05-12: qty 7.14

## 2017-05-12 MED ORDER — SODIUM CHLORIDE 0.9 % IV SOLN
Freq: Once | INTRAVENOUS | Status: AC
  Administered 2017-05-12: 14:00:00 via INTRAVENOUS

## 2017-05-12 NOTE — Progress Notes (Signed)
Given ice bags 15 minutes before starting taxol to put finger tips into during Taxol infusion. She places finger tips and then removes as she can tolerate.

## 2017-05-12 NOTE — Progress Notes (Signed)
[place]  [phone] [phone]  Clinic Follow up Note   Patient Care Team: Manon Hilding, MD as PCP - General (Cardiology) Truitt Merle, MD as Consulting Physician (Hematology) Excell Seltzer, MD as Consulting Physician (General Surgery) Alla Feeling, NP as Nurse Practitioner (Nurse Practitioner) [name], MD as Attending Physician (Cardiology) 05/12/2017  SUMMARY OF ONCOLOGIC HISTORY: Oncology History   Cancer Staging Carcinoma of upper-inner quadrant of left breast in female, estrogen receptor positive ([place]) Staging form: Breast, AJCC 8th Edition - Clinical stage from 01/18/2017: Stage IB (cT2, cN0, cM0, G3, ER: Positive, PR: Positive, HER2: Positive) - Signed by Truitt Merle, MD on 02/10/2017 - Pathologic stage from 02/23/2017: Stage IB (pT3, pN0, cM0, G3, ER: Positive, PR: Positive, HER2: Positive) - Signed by Alla Feeling, NP on 03/16/2017  Carcinoma of upper-outer quadrant of left breast in female, estrogen receptor positive ([place]) Staging form: Breast, AJCC 8th Edition - Clinical stage from 01/18/2017: Stage IB (cT2, cN0, cM0, G2, ER: Positive, PR: Positive, HER2: Negative) - Unsigned - Pathologic stage from 02/23/2017: Stage IB (pT2, pN0(sn), cM0, G3, ER: Positive, PR: Positive, HER2: Negative) - Signed by Alla Feeling, NP on 03/16/2017       Carcinoma of upper-outer quadrant of left breast in female, estrogen receptor positive ([place])   01/13/2017 Mammogram    FINDINGS: In the left breast, a spiculated mass lies in the upper outer quadrant. In the medial posterior left breast, there is a larger lobulated mass. These masses correspond to the palpable abnormalities. No other discrete left breast masses.  In the right breast, there are no discrete masses. There are no areas of architectural distortion.  In both breasts there are scattered calcifications, rounded punctate, with a few other larger coarse dystrophic  calcifications, without significant change from the previous screening mammogram, which is dated 12/04/2009.  Mammographic images were processed with CAD.  On physical exam, there is a firm palpable mass in the upper outer left breast and another firm mass in the medial left breast.       01/13/2017 Breast US    Targeted ultrasound is performed, showing an irregular hypoechoic shadowing mass in the left breast at the 2:30 o'clock position, 5 cm the nipple, measuring 3.3 x 2.8 x 2.6 cm. In the 10:30 o'clock position of the left breast, 5 cm the nipple, there is irregular hypoechoic mass with somewhat more lobulated margins, corresponding to the lobulated mass seen mammographically. This measures 4.8 x 2.9 x 4.8 cm. Both masses show internal vascularity on color Doppler analysis.  Sonographic evaluation of the left axilla shows several nodes with thickened cortices. Status cortex measured is 5 mm. None of these lymph nodes appear enlarged but overall size criteria.  IMPRESSION: 1. Two masses in the left breast, 1 at the 2:30 o'clock position in the other at the 10:30 o'clock position, both highly suspicious for breast carcinoma. 2. Several abnormal left axillary lymph nodes with thickened cortices. 3. No evidence of right breast malignancy.       01/18/2017 Pathology Results    Diagnosis 1. Breast, left, needle core biopsy, 2:30 o'clock - INVASIVE MAMMARY CARCINOMA - MAMMARY CARCINOMA IN-SITU - SEE COMMENT 2. Lymph node, needle/core biopsy, left axilla - NO CARCINOMA IDENTIFIED - SEE COMMENT 3. Breast, left, needle core biopsy, 10:30 o'clock - INVASIVE DUCTAL CARCINOMA - SEE COMMENT  1. PROGNOSTIC INDICATORS Results: IMMUNOHISTOCHEMICAL AND MORPHOMETRIC ANALYSIS PERFORMED MANUALLY Estrogen Receptor: 100%, POSITIVE, STRONG STAINING INTENSITY Progesterone Receptor: 60%, POSITIVE,  STRONG STAINING INTENSITY Proliferation Marker Ki67: 60%  1. FLUORESCENCE IN-SITU  HYBRIDIZATION Results: HER2 - NEGATIVE RATIO OF HER2/CEP17 SIGNALS 1.28 AVERAGE HER2 COPY NUMBER PER CELL 2.75  3. PROGNOSTIC INDICATORS Results: IMMUNOHISTOCHEMICAL AND MORPHOMETRIC ANALYSIS PERFORMED MANUALLY Estrogen Receptor: 100%, POSITIVE, STRONG STAINING INTENSITY Progesterone Receptor: 50%, POSITIVE, STRONG STAINING INTENSITY Proliferation Marker Ki67: 70%  3. FLUORESCENCE IN-SITU HYBRIDIZATION Results: HER2 - **POSITIVE** RATIO OF HER2/CEP17 SIGNALS 2.80 AVERAGE HER2 COPY NUMBER PER CELL 6.15  Microscopic Comment 1. The biopsy material shows an infiltrative proliferation of cells with large vesicular nuclei with inconspicuous nucleoli, arranged linearly and in small clusters. Based on the biopsy, the carcinoma appears Nottingham grade 2 of 3 and measures 0.9 cm in greatest linear extent. E-cadherin and prognostic markers (ER/PR/ki-67/HER2-FISH)are pending and will be reported in an addendum. Dr. Lyndon Code reviewed the case and agrees with the above diagnosis.      02/10/2017 Initial Diagnosis    Carcinoma of upper-outer quadrant of left breast in female, estrogen receptor positive ([place])      02/19/2017 Imaging    Bone Scan whole Body 02/19/17 IMPRESSION: Negative for evidence of bony metastatic disease.      02/19/2017 Imaging    CT CAP W Contrast 02/19/17 IMPRESSION: 1. Small right middle lobe pulmonary nodules up to 0.8 cm, nonspecific. Non-contrast chest CT at 3-6 months is recommended. If the nodules are stable at time of repeat CT, then future CT at 18-24 months (from today's scan) is considered optional for low-risk patients, but is recommended for high-risk patients. This recommendation follows the consensus statement: Guidelines for Management of Incidental Pulmonary Nodules Detected on CT Images: From the Fleischner Society 2017; Radiology 2017; 284:228-243. 2. Subcentimeter hepatic lesions likely cysts but incompletely characterized due to small size. Attention  on follow-up imaging recommended. 3. 2.1 cm fusiform right common iliac artery aneurysm. Continued surveillance recommended. 4. Aortic Atherosclerosis (ICD10-I70.0) and Emphysema (ICD10-J43.9).      02/23/2017 Pathology Results    Diagnosis 1. Breast, simple mastectomy, Left Total - INVASIVE DUCTAL CARCINOMA, MULTIFOCAL, NOTTINGHAM GRADE 3/3 (5.3 CM, 3.5 CM) - INVASIVE CARCINOMA INVOLVES THE DERMIS - DUCTAL CARCINOMA IN SITU, INTERMEDIATE GRADE - HYALINIZED FIBROADENOMA - MARGINS UNINVOLVED BY CARCINOMA - NO CARCINOMA IDENTIFIED IN TWO LYMPH NODES (0/2) - SEE ONCOLOGY TABLE AND COMMENT BELOW 2. Lymph node, sentinel, biopsy, Left Axillary - NO CARCINOMA IDENTIFIED IN ONE LYMPH NODE (0/1) 3. Lymph node, sentinel, biopsy - NO CARCINOMA IDENTIFIED IN ONE LYMPH NODE (0/1) 4. Lymph node, sentinel, biopsy - NO CARCINOMA IDENTIFIED IN ONE LYMPH NODE (0/1) 5. Lymph node, sentinel, biopsy - NO CARCINOMA IDENTIFIED IN ONE LYMPH NODE (0/1) Microscopic Comment 1. BREAST, INVASIVE TUMOR Procedure: Simple mastectomy Laterality: Left Tumor Size: 5. 3 cm, 3.5 cm Histologic Type: Invasive carcinoma of no special type (ductal, not otherwise specified) Grade: Nottingham Grade 3 Tubular Differentiation: 3 Nuclear Pleomorphism: 3 Mitotic Count: 2 Ductal Carcinoma in Situ (DCIS): Present Extent of Tumor: Skin: Invasive carcinoma directly invades into the dermis or epidermis without skin ulceration Margins: Invasive carcinoma, distance from closest margin: 1.5 cm (posterior) DCIS, distance from closest margin: > 1 cm Regional Lymph Nodes: Number of Lymph Nodes Examined: 6 Number of Sentinel Lymph Nodes Examined: 4 Lymph Nodes with Macrometastases: 0 Lymph Nodes with Micrometastases: 0 Lymph Nodes with Isolated Tumor Cells: 0 Treatment effect: No known presurgical therapy Breast Prognostic Profile: See Also ([id number]) Estrogen Receptor: Positive (100%, strong); Positive (100%,  strong) Progesterone Receptor: Positive (50%, strong); Positive (50%, strong) Her2: Positive (Ratio 2.80);  Negative (Ratio 1.28) Ki-67: 70%; 60% Best tumor block for sendout testing: 1E Pathologic Stage Classification (pTNM, AJCC 8th Edition): Primary Tumor: mpT3 Regional Lymph Nodes: pN0 COMMENT: The two invasive carcinomas in the breast have slightly different morphologies. The larger lesion has a papillary and micropapillary pattern admixed with typical ductal carcinoma while the smaller lesion is more typical of a ductal lesion with linear arrays (E-cadherin performed on biopsy). There are foci within the larger lesion that are concerning for lymphovascular space invasion.       04/07/2017 -  Chemotherapy    weekly taxol and herceptin x12 weeks then herceptin q3 weeks for total 1 year      CURRENT THERAPY:  weekly taxol x12 weeks beginning 04/07/17 then herceptin for total 1 year   INTERVAL HISTORY: Ms. Slivka returns for follow up, she presents in the infusion room with her husband. Completed weekly cycle 4 taxol/herceptin on 04/28/17. She feels more rested with extra time off for counts to recover. Eating and drinking well. Restarted chlorthalidone. Has not been scheduled for PT lately, has mild left side lymphedema. Will see breast surgeon on follow up in a couple weeks. Developed mild dull daily headache, has not taken medication. Has cataract but otherwise no vision change, double vision, lightheadedness or dizziness. She notes intermittent tingling to fingertips since last treatment, not limiting function, strength, grip. Has not been dipping fingertips in ice since first taxol.   REVIEW OF SYSTEMS:   Constitutional: Denies fevers, chills or abnormal weight loss (+) fatigue, improved  Eyes: Denies blurriness of vision or double vision (+) cataract Ears, nose, mouth, throat, and face: Denies mucositis or sore throat Respiratory: Denies cough, dyspnea or wheezes Cardiovascular: Denies  palpitation, chest discomfort or lower extremity swelling Gastrointestinal:  Denies nausea, vomiting, diarrhea, heartburn or change in bowel habits (+) constipation  Skin: Denies abnormal skin rashes (+) itching and darkening to hands, improves with hydrocortisone  Lymphatics: Denies easy bruising (+) left side lymphedema Neurological:Denies numbness, tingling or new weaknesses (+) mild intermittent tingling to fingertips, not limiting function grip or strength (+) dull daily headache  Behavioral/Psych: Mood is stable, no new changes  All other systems were reviewed with the patient and are negative.  MEDICAL HISTORY:  Past Medical History:  Diagnosis Date  . Arthritis    "maybe a little in my left knee" (02/23/2017)  . Asthmatic bronchitis   . Cancer of left breast ([place])   . Coronary atherosclerosis    Minor nonobstructive CAD at cardiac catheterization 2009  . Dyslipidemia   . Femoral bruit 7/08   With normal ABIs  . Hypertension   . Lower extremity edema    Chronic  . Pneumonia ~ 1950   in 8th grade  . Type II diabetes mellitus ([place])     SURGICAL HISTORY: Past Surgical History:  Procedure Laterality Date  . APPENDECTOMY    . BREAST BIOPSY Left 02/2017  . CARDIAC CATHETERIZATION     "long time ago" (02/23/2017)  . MASTECTOMY COMPLETE / SIMPLE W/ SENTINEL NODE BIOPSY Left 02/23/2017   TOTAL MATECTOMY;  LEFT AXILLARY SENTINEL LYMPH NODE BIOPSY ERAS PATHWAY  . MASTECTOMY WITH RADIOACTIVE SEED GUIDED EXCISION AND AXILLARY SENTINEL LYMPH NODE BIOPSY Left 02/23/2017   Procedure: LEFT TOTAL MATECTOMY;  LEFT AXILLARY SENTINEL LYMPH NODE BIOPSY ERAS PATHWAY;  Surgeon: Excell Seltzer, MD;  Location: [facility];  Service: General;  Laterality: Left;  . PILONIDAL CYST EXCISION    . PORTA CATH INSERTION  02/23/2017  . PORTACATH PLACEMENT  N/A 02/23/2017   Procedure: INSERTION PORT-A-CATH;  Surgeon: Excell Seltzer, MD;  Location: [facility];  Service: General;  Laterality: N/A;  . SHOULDER  ARTHROSCOPY W/ ROTATOR CUFF REPAIR Right     I have reviewed the social history and family history with the patient and they are unchanged from previous note.  ALLERGIES:  has No Known Allergies.  MEDICATIONS:  Current Outpatient Medications  Medication Sig Dispense Refill  . aspirin 81 MG EC tablet Take 81 mg by mouth at bedtime.     . chlorthalidone (HYGROTON) 25 MG tablet Take 25 mg by mouth daily.     . Coenzyme Q10 (COQ10) 100 MG CAPS Take 100 mg by mouth daily.      Marland Kitchen latanoprost (XALATAN) 0.005 % ophthalmic solution Place 1 drop into both eyes at bedtime.    . lidocaine-prilocaine (EMLA) cream Apply to affected area once 30 g 3  . losartan (COZAAR) 50 MG tablet Take 50 mg by mouth daily.    . metFORMIN (GLUCOPHAGE) 500 MG tablet Take 1,000 mg by mouth daily with breakfast.     . ondansetron (ZOFRAN) 8 MG tablet Take 1 tablet (8 mg total) by mouth 2 (two) times daily as needed (Nausea or vomiting). 30 tablet 1  . prochlorperazine (COMPAZINE) 10 MG tablet Take 1 tablet (10 mg total) by mouth every 6 (six) hours as needed (Nausea or vomiting). 30 tablet 1  . traMADol (ULTRAM) 50 MG tablet Take 1 tablet (50 mg total) by mouth every 6 (six) hours as needed (mild pain). 15 tablet 0  . vitamin B-12 (CYANOCOBALAMIN) 1000 MCG tablet Take 1,000 mcg by mouth daily.     No current facility-administered medications for this visit.    Facility-Administered Medications Ordered in Other Visits  Medication Dose Route Frequency Provider Last Rate Last Dose  . heparin lock flush 100 unit/mL  500 Units Intracatheter Once PRN Truitt Merle, MD      . PACLitaxel (TAXOL) 144 mg in sodium chloride 0.9 % 250 mL chemo infusion (</= 67m/m2)  80 mg/m2 (Treatment Plan Recorded) Intravenous Once FTruitt Merle MD      . sodium chloride flush (NS) 0.9 % injection 10 mL  10 mL Intracatheter PRN FTruitt Merle MD      . trastuzumab (HERCEPTIN) 150 mg in sodium chloride 0.9 % 250 mL chemo infusion  150 mg Intravenous Once  FTruitt Merle MD        PHYSICAL EXAMINATION: ECOG PERFORMANCE STATUS: 1 - Symptomatic but completely ambulatory BP 142/71 HR 69 T 98.1 O2 sat 100%  GENERAL:alert, no distress and comfortable SKIN: skin color, texture, turgor are normal, no rashes or significant lesions (+) hyperpigmentation to hands  EYES: normal, Conjunctiva are pink and non-injected, sclera clear OROPHARYNX:no exudate, no erythema and lips, buccal mucosa, and tongue normal  LYMPH:  no palpable cervical, supraclavicular, or axillary lymphadenopathy LUNGS: clear to auscultation with normal breathing effort HEART: regular rate & rhythm and no murmurs and no lower extremity edema ABDOMEN:abdomen soft, non-tender and normal bowel sounds Musculoskeletal:no cyanosis of digits and no clubbing  NEURO: alert & oriented x 3 with fluent speech, no focal motor/sensory deficits BREASTS: exam limited due to infusion room; mild left lateral chest wall/scapular lymphedema PAC without erythema   LABORATORY DATA:  I have reviewed the data as listed CBC Latest Ref Rng & Units 05/12/2017 04/28/2017 04/21/2017  WBC 3.9 - 10.3 K/uL 3.6(L) 2.5(L) 2.6(L)  Hemoglobin 12.0 - 15.0 g/dL - - -  Hematocrit 34.8 - 46.6 %  31.8(L) 32.2(L) 32.9(L)  Platelets 145 - 400 K/uL 328 256 220     CMP Latest Ref Rng & Units 05/12/2017 04/28/2017 04/21/2017  Glucose 70 - 140 mg/dL 109 143(H) 197(H)  BUN 7 - 26 mg/dL 7 8 8   Creatinine 0.60 - 1.10 mg/dL 0.72 0.73 0.81  Sodium 136 - 145 mmol/L 131(L) 127(L) 128(L)  Potassium 3.5 - 5.1 mmol/L 4.0 3.6 3.2(L)  Chloride 98 - 109 mmol/L 96(L) 93(L) 93(L)  CO2 22 - 29 mmol/L 27 26 27   Calcium 8.4 - 10.4 mg/dL 9.3 9.0 9.2  Total Protein 6.4 - 8.3 g/dL 6.2(L) 6.2(L) 6.1(L)  Total Bilirubin 0.2 - 1.2 mg/dL 0.3 0.5 0.5  Alkaline Phos 40 - 150 U/L 58 51 51  AST 5 - 34 U/L 16 15 16   ALT 0 - 55 U/L 15 19 19       RADIOGRAPHIC STUDIES: I have personally reviewed the radiological images as listed and agreed with the  findings in the report. No results found.   ASSESSMENT & PLAN: Penny Huynh is an 84 year old with past medical history positive for HTN, DM, CAD now with newly diagnosed left breast cancer  1.Multifocal invasive ductal carcinoma of the left breast of female;carcinoma of the upper-outer quadrant of left breast and female, ER/PR positive, HER-2 negative pT2N0M0, G2, Stage 1B; IDC of the upper-inner quadrant left breast, ER+/PR+/HER2+, pT3N0M0 G3, stage IB -Ms. Martelle appears stable. She completed 4 cycles weekly taxol/herceptin on 04/28/17, required 1 week delay with cycle 5 for neutropenia but has recovered well. She is tolerating treatment well. VSS. Labs reviewed. ANC normalized to 2.1, CBC adequate to continue treatment. If ANC drops in the future may consider growth factor. Proceed with cycle 5 weekly taxol/herceptin today, f/u in 2 weeks.  2. HTN, CAD, DM -Well controlled lately; she restarted chlorthalidone, hyponatremia improved will monitor closely.  3. Skin rash -Her rash resolved, still has mild itching to her hands, controlled well with topical hydrocortisone   4. Hyponatremia -Previously held chlorthalidone but has restarted recently. Na 131 today, improving. Encouraged her to remain adequately hydrated   5. Neutropenia, secondary to chemotherapy -She developed neutropenia with cycle 4, ANC 1.4; she was treated and given 1 extra week off to recover. She has recovered well, ANC 2.1 today. May consider growth factor in the future for persistent neuropathy   6. Peripheral neuropathy, G1, secondary to taxol  -Developed after cycle 4, mild and not limiting function. I recommend she soak fingertips in ice during taxol, she agrees to try today. OK to take B complex vitamin. If symptoms worsen may need to amend treatment plan. Will monitor closely.   7. Headaches  -She developed mild dull daily headache after cycle 4. Neuro exam otherwise unremarkable. Possibly related to  treatment. OK to take NSAID/tylenol PRN. I encouraged her to remain hydrated. Will monitor.  PLAN: -Labs reviewed, proceed with cycle 5 weekly taxol/herceptin  -Fingers in ice during chemo as tolerated -B complex for neuropathy -Treat symptomatically for headaches -Continue weekly taxol/herceptin for total 12 cycles, then herceptin for 1 year -F/u in 2 weeks   All questions were answered. The patient knows to call the clinic with any problems, questions or concerns. No barriers to learning was detected. I spent 20 minutes counseling the patient face to face. The total time spent in the appointment was 25 minutes and more than 50% was on counseling and review of test results     Alla Feeling, NP 05/12/17

## 2017-05-12 NOTE — Patient Instructions (Signed)
[facility] Discharge Instructions for Patients Receiving Chemotherapy  Today you received the following chemotherapy agents Herceptin and Taxol.  To help prevent nausea and vomiting after your treatment, we encourage you to take your nausea medication as prescribed.   If you develop nausea and vomiting that is not controlled by your nausea medication, call the clinic.   BELOW ARE SYMPTOMS THAT SHOULD BE REPORTED IMMEDIATELY:  *FEVER GREATER THAN 100.5 F  *CHILLS WITH OR WITHOUT FEVER  NAUSEA AND VOMITING THAT IS NOT CONTROLLED WITH YOUR NAUSEA MEDICATION  *UNUSUAL SHORTNESS OF BREATH  *UNUSUAL BRUISING OR BLEEDING  TENDERNESS IN MOUTH AND THROAT WITH OR WITHOUT PRESENCE OF ULCERS  *URINARY PROBLEMS  *BOWEL PROBLEMS  UNUSUAL RASH Items with * indicate a potential emergency and should be followed up as soon as possible.  Feel free to call the clinic should you have any questions or concerns. The clinic phone number is [phone].  Please show the CHEMO ALERT CARD at check-in to the Emergency Department and triage nurse.   

## 2017-05-17 ENCOUNTER — Ambulatory Visit: Payer: Medicare Other | Attending: General Surgery | Admitting: Physical Therapy

## 2017-05-17 DIAGNOSIS — R293 Abnormal posture: Secondary | ICD-10-CM | POA: Diagnosis not present

## 2017-05-17 DIAGNOSIS — M25612 Stiffness of left shoulder, not elsewhere classified: Secondary | ICD-10-CM | POA: Insufficient documentation

## 2017-05-17 DIAGNOSIS — Z483 Aftercare following surgery for neoplasm: Secondary | ICD-10-CM | POA: Insufficient documentation

## 2017-05-17 NOTE — Therapy (Signed)
Progress Note Reporting Period 02/08/17 to 05/17/17  See note below for Objective Data and Assessment of Progress/Goals.       [place], [place], [address] Phone: [phone]   Fax:  [phone]  Physical Therapy Treatment  Patient Details  Name: Penny Huynh MRN: [id number] Date of Birth: 1933-12-09 Referring Provider: [name]   Encounter Date: 05/17/2017  PT End of Session - 05/17/17 1729    Visit Number  11    Number of Visits  19    Date for PT Re-Evaluation  06/18/17    PT Start Time  7616    PT Stop Time  1515    PT Time Calculation (min)  46 min    Activity Tolerance  Patient tolerated treatment well    Behavior During Therapy  [facility] for tasks assessed/performed       Past Medical History:  Diagnosis Date  . Arthritis    "maybe a little in my left knee" (02/23/2017)  . Asthmatic bronchitis   . Cancer of left breast ([place])   . Coronary atherosclerosis    Minor nonobstructive CAD at cardiac catheterization 2009  . Dyslipidemia   . Femoral bruit 7/08   With normal ABIs  . Hypertension   . Lower extremity edema    Chronic  . Pneumonia ~ 1950   in 8th grade  . Type II diabetes mellitus ([facility])     Past Surgical History:  Procedure Laterality Date  . APPENDECTOMY    . BREAST BIOPSY Left 02/2017  . CARDIAC CATHETERIZATION     "long time ago" (02/23/2017)  . MASTECTOMY COMPLETE / SIMPLE W/ SENTINEL NODE BIOPSY Left 02/23/2017   TOTAL MATECTOMY;  LEFT AXILLARY SENTINEL LYMPH NODE BIOPSY ERAS PATHWAY  . MASTECTOMY WITH RADIOACTIVE SEED GUIDED EXCISION AND AXILLARY SENTINEL LYMPH NODE BIOPSY Left 02/23/2017   Procedure: LEFT TOTAL MATECTOMY;  LEFT AXILLARY SENTINEL LYMPH NODE BIOPSY ERAS PATHWAY;  Surgeon: Excell Seltzer, MD;  Location: [place];  Service: General;  Laterality: Left;  . PILONIDAL CYST EXCISION    . PORTA CATH INSERTION  02/23/2017  . PORTACATH  PLACEMENT N/A 02/23/2017   Procedure: INSERTION PORT-A-CATH;  Surgeon: Excell Seltzer, MD;  Location: [place];  Service: General;  Laterality: N/A;  . SHOULDER ARTHROSCOPY W/ ROTATOR CUFF REPAIR Right     There were no vitals filed for this visit.  Subjective Assessment - 05/17/17 1430    Subjective  Got the compression sleeve last Wednesday.  I haven't worn it every day, but I put it on this morning because I wanted to go to exercise.  It's comfortable.  I don't know how often I'm supposed to wear it. "I've been walking, about a mile a day, about 21 minutes." Reports she still has swelling at left flank; is wearing compression bra and one is snugger than the other.  That does help.    Pertinent History  Patient underwent a left mastectomy and sentinel node biopsy (6 negative nodes) on 02/23/17. She will begin chemotherapy 03/31/17 as long as drain is removed today. She has 2 different breast cancers. Both are ER/PR positive but one is HER2 negative and one is HER2 positive. History of right shoulder scope 20 years ago and has diabetes and hypertension, both of which are well controlled.    Currently in Pain?  No/denies         [facility] PT Assessment - 05/17/17 0001      AROM  Left Shoulder Flexion  141 Degrees still with obvious cording in axilla                   [facility] Adult PT Treatment/Exercise - 05/17/17 0001      Self-Care   Other Self-Care Comments   suggested to patient that she wear the compression sleeve she just got every day for the time being, to see if it helps the cording; reviewed lymphedema risk reduction      Manual Therapy   Manual Therapy  Edema management    Edema Management  assisted patient to don compression sleeve    Soft tissue mobilization  In left axilla at tight tissue and at thin cording palpable near there. Also for a slight cording at left antecubital fossa.    Myofascial Release  across left axilla from left upper arm down to abdomen in  diagonal, in supine; at upper and lower left axilla    Passive ROM  in supine to left shoulder into er and abduction, also for horizontal abduction.  In right sidelying, left shoulder abduction with prolonged stretch with myofascial release as well.             PT Education - 05/17/17 1442    Education provided  Yes    Education Details  reviewed lymphedema risk reduction handout    Person(s) Educated  Patient    Methods  Explanation;Handout    Comprehension  Verbalized understanding          PT Long Term Goals - 05/17/17 1432      PT LONG TERM GOAL #1   Title  Patient will be independent in her home exercise program for shoulder ROM.    Baseline  Pt is independent with inital HEP for stretching and began postural strength today-04/13/17    Status  Partially Met      PT LONG TERM GOAL #2   Title  Patient will demonstrate shoulder flexion AROM to be >/= 140 degrees for increased ease reaching overhead.    Baseline  65 degrees; 131 degrees - 04/13/17, 04/19/17- 130; 141 on 05/17/17    Status  Achieved      PT LONG TERM GOAL #3   Title  Patient will demonstrate shoulder abduction AROM to be >/= 140 degrees for increased ease reaching overhead.    Status  Achieved      PT LONG TERM GOAL #4   Title  Patient will score </= 10 on the DASH indicating increased shoulder function.    Status  On-going      PT LONG TERM GOAL #5   Title  Patient will verbalize she understands lymphedema risk reduction.    Baseline  04/19/17- re educated pt in risk reduction handout; gave handout on 05/17/17    Status  On-going      Breast Clinic Goals - 02/08/17 2024      Patient will be able to verbalize understanding of pertinent lymphedema risk reduction practices relevant to her diagnosis specifically related to skin care.   Status  Achieved      Patient will be able to return demonstrate and/or verbalize understanding of the post-op home exercise program related to regaining shoulder range of  motion.   Status  Achieved      Patient will be able to verbalize understanding of the importance of attending the postoperative After Breast Cancer Class for further lymphedema risk reduction education and therapeutic exercise.   Status  Achieved  Plan - 05/17/17 1730    Clinical Impression Statement  Continues to notice the cording. Just got a compression sleeve and it fits well; she asked about how much to wear it and I suggested she wear it daily for the time being, to see if it may help reduce cording. Pt. wants to continue therapy.  She cannot do the same kind of treatment we can do independently by stretching at home.    Rehab Potential  Excellent    PT Frequency  2x / week    PT Duration  4 weeks    PT Treatment/Interventions  ADLs/Self Care Home Management;Therapeutic exercise;Patient/family education;Orthotic Fit/Training;Manual techniques;Passive range of motion;Manual lymph drainage;Scar mobilization;DME Instruction    PT Next Visit Plan  Focus on soft tissue mobilization and myofascial release for cording of left axilla and elbow as well as left shoulder ROM.    PT Home Exercise Plan  focus on stretches to try to release cording; supine scapular series  Pt. returned to her previous exercise program at her church as of 05/17/17.    Consulted and Agree with Plan of Care  Patient    Family Member Consulted  Husband       Patient will benefit from skilled therapeutic intervention in order to improve the following deficits and impairments:  Postural dysfunction, Decreased knowledge of precautions, Decreased knowledge of use of DME, Impaired UE functional use, Decreased range of motion, Decreased strength, Increased fascial restricitons, Decreased scar mobility  Visit Diagnosis: Aftercare following surgery for neoplasm - Plan: PT plan of care cert/re-cert  Stiffness of left shoulder, not elsewhere classified - Plan: PT plan of care cert/re-cert     Problem  List Patient Active Problem List   Diagnosis Date Noted  . Port-A-Cath in place 04/14/2017  . Stage II breast cancer, left ([place]) 02/23/2017  . Carcinoma of upper-outer quadrant of left breast in female, estrogen receptor positive ([place]) 02/10/2017  . Carcinoma of upper-inner quadrant of left breast in female, estrogen receptor positive ([place]) 02/10/2017  . Chest pain 07/18/2015  . Hypokalemia 07/18/2015  . Vertigo 07/18/2015  . LBBB (left bundle branch block) 05/28/2009  . AODM 12/21/2007  . Essential hypertension, benign 12/21/2007  . DIASTOLIC DYSFUNCTION 46/80/3212  . FEMORAL BRUIT, RIGHT 12/21/2007  . DYSPNEA 12/21/2007    Keyanna Sandefer 05/17/2017, 5:36 PM  [place], [place], [address] Phone: [phone]   Fax:  [phone]  Name: JACKY HARTUNG MRN: [id number] Date of Birth: 19-Jul-1933  Serafina Royals, PT 05/17/17 5:37 PM

## 2017-05-19 ENCOUNTER — Inpatient Hospital Stay: Payer: Medicare Other

## 2017-05-19 VITALS — BP 135/72 | HR 69 | Temp 98.9°F | Resp 16

## 2017-05-19 DIAGNOSIS — Z5111 Encounter for antineoplastic chemotherapy: Secondary | ICD-10-CM | POA: Diagnosis not present

## 2017-05-19 DIAGNOSIS — R918 Other nonspecific abnormal finding of lung field: Secondary | ICD-10-CM | POA: Diagnosis not present

## 2017-05-19 DIAGNOSIS — C50212 Malignant neoplasm of upper-inner quadrant of left female breast: Secondary | ICD-10-CM

## 2017-05-19 DIAGNOSIS — Z170 Estrogen receptor positive status [ER+]: Principal | ICD-10-CM

## 2017-05-19 DIAGNOSIS — C50412 Malignant neoplasm of upper-outer quadrant of left female breast: Secondary | ICD-10-CM | POA: Diagnosis not present

## 2017-05-19 DIAGNOSIS — Z95828 Presence of other vascular implants and grafts: Secondary | ICD-10-CM

## 2017-05-19 DIAGNOSIS — K769 Liver disease, unspecified: Secondary | ICD-10-CM | POA: Diagnosis not present

## 2017-05-19 LAB — CBC WITH DIFFERENTIAL (CANCER CENTER ONLY)
Basophils Absolute: 0.1 10*3/uL (ref 0.0–0.1)
Basophils Relative: 2 %
Eosinophils Absolute: 0.1 10*3/uL (ref 0.0–0.5)
Eosinophils Relative: 2 %
HEMATOCRIT: 31.2 % — AB (ref 34.8–46.6)
HEMOGLOBIN: 10.2 g/dL — AB (ref 11.6–15.9)
LYMPHS ABS: 0.8 10*3/uL — AB (ref 0.9–3.3)
LYMPHS PCT: 25 %
MCH: 30.2 pg (ref 25.1–34.0)
MCHC: 32.7 g/dL (ref 31.5–36.0)
MCV: 92.3 fL (ref 79.5–101.0)
MONO ABS: 0.3 10*3/uL (ref 0.1–0.9)
MONOS PCT: 10 %
NEUTROS PCT: 61 %
Neutro Abs: 2.1 10*3/uL (ref 1.5–6.5)
Platelet Count: 286 10*3/uL (ref 145–400)
RBC: 3.38 MIL/uL — ABNORMAL LOW (ref 3.70–5.45)
RDW: 13.5 % (ref 11.2–14.5)
WBC Count: 3.4 10*3/uL — ABNORMAL LOW (ref 3.9–10.3)

## 2017-05-19 LAB — CMP (CANCER CENTER ONLY)
ALT: 16 U/L (ref 0–55)
ANION GAP: 4 (ref 3–11)
AST: 16 U/L (ref 5–34)
Albumin: 3.3 g/dL — ABNORMAL LOW (ref 3.5–5.0)
Alkaline Phosphatase: 53 U/L (ref 40–150)
BUN: 9 mg/dL (ref 7–26)
CHLORIDE: 98 mmol/L (ref 98–109)
CO2: 28 mmol/L (ref 22–29)
Calcium: 9.2 mg/dL (ref 8.4–10.4)
Creatinine: 0.68 mg/dL (ref 0.60–1.10)
GFR, Estimated: >60 mL/min (ref >60)
GLUCOSE: 111 mg/dL (ref 70–140)
Potassium: 3.5 mmol/L (ref 3.5–5.1)
SODIUM: 130 mmol/L — AB (ref 136–145)
Total Bilirubin: 0.5 mg/dL (ref 0.2–1.2)
Total Protein: 6.3 g/dL — ABNORMAL LOW (ref 6.4–8.3)

## 2017-05-19 MED ORDER — DIPHENHYDRAMINE HCL 50 MG/ML IJ SOLN
INTRAMUSCULAR | Status: AC
  Filled 2017-05-19: qty 1

## 2017-05-19 MED ORDER — ACETAMINOPHEN 325 MG PO TABS
ORAL_TABLET | ORAL | Status: AC
  Filled 2017-05-19: qty 2

## 2017-05-19 MED ORDER — SODIUM CHLORIDE 0.9% FLUSH
10.0000 mL | INTRAVENOUS | Status: DC | PRN
Start: 2017-05-19 — End: 2017-05-19
  Administered 2017-05-19: 10 mL
  Filled 2017-05-19: qty 10

## 2017-05-19 MED ORDER — DIPHENHYDRAMINE HCL 50 MG/ML IJ SOLN
50.0000 mg | Freq: Once | INTRAMUSCULAR | Status: AC
  Administered 2017-05-19: 50 mg via INTRAVENOUS

## 2017-05-19 MED ORDER — TRASTUZUMAB CHEMO 150 MG IV SOLR
150.0000 mg | Freq: Once | INTRAVENOUS | Status: AC
  Administered 2017-05-19: 150 mg via INTRAVENOUS
  Filled 2017-05-19: qty 7.14

## 2017-05-19 MED ORDER — FAMOTIDINE IN NACL 20-0.9 MG/50ML-% IV SOLN
INTRAVENOUS | Status: AC
  Filled 2017-05-19: qty 50

## 2017-05-19 MED ORDER — DEXAMETHASONE SODIUM PHOSPHATE 10 MG/ML IJ SOLN
INTRAMUSCULAR | Status: AC
  Filled 2017-05-19: qty 1

## 2017-05-19 MED ORDER — DEXAMETHASONE SODIUM PHOSPHATE 100 MG/10ML IJ SOLN
20.0000 mg | Freq: Once | INTRAMUSCULAR | Status: AC
  Administered 2017-05-19: 20 mg via INTRAVENOUS
  Filled 2017-05-19: qty 2

## 2017-05-19 MED ORDER — SODIUM CHLORIDE 0.9 % IV SOLN
Freq: Once | INTRAVENOUS | Status: AC
  Administered 2017-05-19: 11:00:00 via INTRAVENOUS

## 2017-05-19 MED ORDER — SODIUM CHLORIDE 0.9 % IV SOLN
80.0000 mg/m2 | Freq: Once | INTRAVENOUS | Status: AC
  Administered 2017-05-19: 144 mg via INTRAVENOUS
  Filled 2017-05-19: qty 24

## 2017-05-19 MED ORDER — SODIUM CHLORIDE 0.9% FLUSH
10.0000 mL | INTRAVENOUS | Status: DC | PRN
  Administered 2017-05-19: 10 mL
  Filled 2017-05-19: qty 10

## 2017-05-19 MED ORDER — HEPARIN SOD (PORK) LOCK FLUSH 100 UNIT/ML IV SOLN
500.0000 [IU] | Freq: Once | INTRAVENOUS | Status: AC | PRN
  Administered 2017-05-19: 500 [IU]
  Filled 2017-05-19: qty 5

## 2017-05-19 MED ORDER — ACETAMINOPHEN 325 MG PO TABS
650.0000 mg | ORAL_TABLET | Freq: Once | ORAL | Status: AC
  Administered 2017-05-19: 650 mg via ORAL

## 2017-05-19 MED ORDER — FAMOTIDINE IN NACL 20-0.9 MG/50ML-% IV SOLN
20.0000 mg | Freq: Once | INTRAVENOUS | Status: AC
  Administered 2017-05-19: 20 mg via INTRAVENOUS

## 2017-05-19 NOTE — Patient Instructions (Signed)
[facility] Discharge Instructions for Patients Receiving Chemotherapy  Today you received the following chemotherapy agents Herceptin, Taxol  To help prevent nausea and vomiting after your treatment, we encourage you to take your nausea medication as directed  If you develop nausea and vomiting that is not controlled by your nausea medication, call the clinic.   BELOW ARE SYMPTOMS THAT SHOULD BE REPORTED IMMEDIATELY:  *FEVER GREATER THAN 100.5 F  *CHILLS WITH OR WITHOUT FEVER  NAUSEA AND VOMITING THAT IS NOT CONTROLLED WITH YOUR NAUSEA MEDICATION  *UNUSUAL SHORTNESS OF BREATH  *UNUSUAL BRUISING OR BLEEDING  TENDERNESS IN MOUTH AND THROAT WITH OR WITHOUT PRESENCE OF ULCERS  *URINARY PROBLEMS  *BOWEL PROBLEMS  UNUSUAL RASH Items with * indicate a potential emergency and should be followed up as soon as possible.  Feel free to call the clinic should you have any questions or concerns. The clinic phone number is [phone].  Please show the CHEMO ALERT CARD at check-in to the Emergency Department and triage nurse.   

## 2017-05-23 NOTE — Progress Notes (Signed)
[facility]  [phone] [phone]  Clinic Follow up Note   Patient Care Team: Manon Hilding, MD as PCP - General (Cardiology) Truitt Merle, MD as Consulting Physician (Hematology) Excell Seltzer, MD as Consulting Physician (General Surgery) Alla Feeling, NP as Nurse Practitioner (Nurse Practitioner) [name], MD as Attending Physician (Cardiology) 05/26/2017  SUMMARY OF ONCOLOGIC HISTORY: Oncology History   Cancer Staging Carcinoma of upper-inner quadrant of left breast in female, estrogen receptor positive ([place]) Staging form: Breast, AJCC 8th Edition - Clinical stage from 01/18/2017: Stage IB (cT2, cN0, cM0, G3, ER: Positive, PR: Positive, HER2: Positive) - Signed by Truitt Merle, MD on 02/10/2017 - Pathologic stage from 02/23/2017: Stage IB (pT3, pN0, cM0, G3, ER: Positive, PR: Positive, HER2: Positive) - Signed by Alla Feeling, NP on 03/16/2017  Carcinoma of upper-outer quadrant of left breast in female, estrogen receptor positive ([place]) Staging form: Breast, AJCC 8th Edition - Clinical stage from 01/18/2017: Stage IB (cT2, cN0, cM0, G2, ER: Positive, PR: Positive, HER2: Negative) - Unsigned - Pathologic stage from 02/23/2017: Stage IB (pT2, pN0(sn), cM0, G3, ER: Positive, PR: Positive, HER2: Negative) - Signed by Alla Feeling, NP on 03/16/2017       Carcinoma of upper-outer quadrant of left breast in female, estrogen receptor positive ([place])   01/13/2017 Mammogram    FINDINGS: In the left breast, a spiculated mass lies in the upper outer quadrant. In the medial posterior left breast, there is a larger lobulated mass. These masses correspond to the palpable abnormalities. No other discrete left breast masses.  In the right breast, there are no discrete masses. There are no areas of architectural distortion.  In both breasts there are scattered calcifications, rounded punctate, with a few other larger coarse dystrophic  calcifications, without significant change from the previous screening mammogram, which is dated 12/04/2009.  Mammographic images were processed with CAD.  On physical exam, there is a firm palpable mass in the upper outer left breast and another firm mass in the medial left breast.       01/13/2017 Breast US    Targeted ultrasound is performed, showing an irregular hypoechoic shadowing mass in the left breast at the 2:30 o'clock position, 5 cm the nipple, measuring 3.3 x 2.8 x 2.6 cm. In the 10:30 o'clock position of the left breast, 5 cm the nipple, there is irregular hypoechoic mass with somewhat more lobulated margins, corresponding to the lobulated mass seen mammographically. This measures 4.8 x 2.9 x 4.8 cm. Both masses show internal vascularity on color Doppler analysis.  Sonographic evaluation of the left axilla shows several nodes with thickened cortices. Status cortex measured is 5 mm. None of these lymph nodes appear enlarged but overall size criteria.  IMPRESSION: 1. Two masses in the left breast, 1 at the 2:30 o'clock position in the other at the 10:30 o'clock position, both highly suspicious for breast carcinoma. 2. Several abnormal left axillary lymph nodes with thickened cortices. 3. No evidence of right breast malignancy.       01/18/2017 Pathology Results    Diagnosis 1. Breast, left, needle core biopsy, 2:30 o'clock - INVASIVE MAMMARY CARCINOMA - MAMMARY CARCINOMA IN-SITU - SEE COMMENT 2. Lymph node, needle/core biopsy, left axilla - NO CARCINOMA IDENTIFIED - SEE COMMENT 3. Breast, left, needle core biopsy, 10:30 o'clock - INVASIVE DUCTAL CARCINOMA - SEE COMMENT  1. PROGNOSTIC INDICATORS Results: IMMUNOHISTOCHEMICAL AND MORPHOMETRIC ANALYSIS PERFORMED MANUALLY Estrogen Receptor: 100%, POSITIVE, STRONG STAINING INTENSITY Progesterone Receptor: 60%, POSITIVE,  STRONG STAINING INTENSITY Proliferation Marker Ki67: 60%  1. FLUORESCENCE IN-SITU  HYBRIDIZATION Results: HER2 - NEGATIVE RATIO OF HER2/CEP17 SIGNALS 1.28 AVERAGE HER2 COPY NUMBER PER CELL 2.75  3. PROGNOSTIC INDICATORS Results: IMMUNOHISTOCHEMICAL AND MORPHOMETRIC ANALYSIS PERFORMED MANUALLY Estrogen Receptor: 100%, POSITIVE, STRONG STAINING INTENSITY Progesterone Receptor: 50%, POSITIVE, STRONG STAINING INTENSITY Proliferation Marker Ki67: 70%  3. FLUORESCENCE IN-SITU HYBRIDIZATION Results: HER2 - **POSITIVE** RATIO OF HER2/CEP17 SIGNALS 2.80 AVERAGE HER2 COPY NUMBER PER CELL 6.15  Microscopic Comment 1. The biopsy material shows an infiltrative proliferation of cells with large vesicular nuclei with inconspicuous nucleoli, arranged linearly and in small clusters. Based on the biopsy, the carcinoma appears Nottingham grade 2 of 3 and measures 0.9 cm in greatest linear extent. E-cadherin and prognostic markers (ER/PR/ki-67/HER2-FISH)are pending and will be reported in an addendum. Dr. Lyndon Code reviewed the case and agrees with the above diagnosis.      02/10/2017 Initial Diagnosis    Carcinoma of upper-outer quadrant of left breast in female, estrogen receptor positive ([place])      02/19/2017 Imaging    Bone Scan whole Body 02/19/17 IMPRESSION: Negative for evidence of bony metastatic disease.      02/19/2017 Imaging    CT CAP W Contrast 02/19/17 IMPRESSION: 1. Small right middle lobe pulmonary nodules up to 0.8 cm, nonspecific. Non-contrast chest CT at 3-6 months is recommended. If the nodules are stable at time of repeat CT, then future CT at 18-24 months (from today's scan) is considered optional for low-risk patients, but is recommended for high-risk patients. This recommendation follows the consensus statement: Guidelines for Management of Incidental Pulmonary Nodules Detected on CT Images: From the Fleischner Society 2017; Radiology 2017; 284:228-243. 2. Subcentimeter hepatic lesions likely cysts but incompletely characterized due to small size. Attention  on follow-up imaging recommended. 3. 2.1 cm fusiform right common iliac artery aneurysm. Continued surveillance recommended. 4. Aortic Atherosclerosis (ICD10-I70.0) and Emphysema (ICD10-J43.9).      02/23/2017 Pathology Results    Diagnosis 1. Breast, simple mastectomy, Left Total - INVASIVE DUCTAL CARCINOMA, MULTIFOCAL, NOTTINGHAM GRADE 3/3 (5.3 CM, 3.5 CM) - INVASIVE CARCINOMA INVOLVES THE DERMIS - DUCTAL CARCINOMA IN SITU, INTERMEDIATE GRADE - HYALINIZED FIBROADENOMA - MARGINS UNINVOLVED BY CARCINOMA - NO CARCINOMA IDENTIFIED IN TWO LYMPH NODES (0/2) - SEE ONCOLOGY TABLE AND COMMENT BELOW 2. Lymph node, sentinel, biopsy, Left Axillary - NO CARCINOMA IDENTIFIED IN ONE LYMPH NODE (0/1) 3. Lymph node, sentinel, biopsy - NO CARCINOMA IDENTIFIED IN ONE LYMPH NODE (0/1) 4. Lymph node, sentinel, biopsy - NO CARCINOMA IDENTIFIED IN ONE LYMPH NODE (0/1) 5. Lymph node, sentinel, biopsy - NO CARCINOMA IDENTIFIED IN ONE LYMPH NODE (0/1) Microscopic Comment 1. BREAST, INVASIVE TUMOR Procedure: Simple mastectomy Laterality: Left Tumor Size: 5. 3 cm, 3.5 cm Histologic Type: Invasive carcinoma of no special type (ductal, not otherwise specified) Grade: Nottingham Grade 3 Tubular Differentiation: 3 Nuclear Pleomorphism: 3 Mitotic Count: 2 Ductal Carcinoma in Situ (DCIS): Present Extent of Tumor: Skin: Invasive carcinoma directly invades into the dermis or epidermis without skin ulceration Margins: Invasive carcinoma, distance from closest margin: 1.5 cm (posterior) DCIS, distance from closest margin: > 1 cm Regional Lymph Nodes: Number of Lymph Nodes Examined: 6 Number of Sentinel Lymph Nodes Examined: 4 Lymph Nodes with Macrometastases: 0 Lymph Nodes with Micrometastases: 0 Lymph Nodes with Isolated Tumor Cells: 0 Treatment effect: No known presurgical therapy Breast Prognostic Profile: See Also ([id number]) Estrogen Receptor: Positive (100%, strong); Positive (100%,  strong) Progesterone Receptor: Positive (50%, strong); Positive (50%, strong) Her2: Positive (Ratio 2.80);  Negative (Ratio 1.28) Ki-67: 70%; 60% Best tumor block for sendout testing: 1E Pathologic Stage Classification (pTNM, AJCC 8th Edition): Primary Tumor: mpT3 Regional Lymph Nodes: pN0 COMMENT: The two invasive carcinomas in the breast have slightly different morphologies. The larger lesion has a papillary and micropapillary pattern admixed with typical ductal carcinoma while the smaller lesion is more typical of a ductal lesion with linear arrays (E-cadherin performed on biopsy). There are foci within the larger lesion that are concerning for lymphovascular space invasion.       04/07/2017 -  Chemotherapy    weekly taxol and herceptin x12 weeks then herceptin q3 weeks for total 1 year      CURRENT THERAPY: weekly taxol x12 weeks beginning 04/07/17 then herceptin for total 1 year    INTERVAL HISTORY: Ms. Shipp returns for follow up as scheduled prior to next weekly taxol/herceptin. She is not having any trouble with treatments. Her normal activities take some effort but she remains active and exercises regularly. No fever or chills. Has good appetite and normal BM. She is wearing left LE sleeve today and has resumed PT. She feels this is helping left arm lymphedema. Headaches resolved. She notes tingling and burning to her feet bilaterally, slightly present prior to chemo but has worsened over the last week. She has to work hard to stay balanced. Has a cane but hasn't used it yet. No fall.   REVIEW OF SYSTEMS:   Constitutional: Denies fevers, chills or abnormal weight loss (+) fatigue  Eyes: Denies blurriness of vision Ears, nose, mouth, throat, and face: Denies mucositis or sore throat Respiratory: Denies dyspnea or wheezes (+) cough with clear phlegm at baseline   Cardiovascular: Denies palpitation, chest discomfort or lower extremity swelling Gastrointestinal:  Denies nausea,  vomiting, constipation, diarrhea, heartburn or change in bowel habits Skin: Denies abnormal skin rashes Lymphatics: Denies new lymphadenopathy or easy bruising (+) LUE lymphedema, wears sleeve and attends PT Neurological:Denies numbness, headaches, or new weaknesses (+) tingling and burning from toes extending to just above ankles, worse over 1 week but mildly present prior to chemo (+) affecting balance  Behavioral/Psych: Mood is stable, no new changes  All other systems were reviewed with the patient and are negative.  MEDICAL HISTORY:  Past Medical History:  Diagnosis Date  . Arthritis    "maybe a little in my left knee" (02/23/2017)  . Asthmatic bronchitis   . Cancer of left breast ([place])   . Coronary atherosclerosis    Minor nonobstructive CAD at cardiac catheterization 2009  . Dyslipidemia   . Femoral bruit 7/08   With normal ABIs  . Hypertension   . Lower extremity edema    Chronic  . Pneumonia ~ 1950   in 8th grade  . Type II diabetes mellitus ([place])     SURGICAL HISTORY: Past Surgical History:  Procedure Laterality Date  . APPENDECTOMY    . BREAST BIOPSY Left 02/2017  . CARDIAC CATHETERIZATION     "long time ago" (02/23/2017)  . MASTECTOMY COMPLETE / SIMPLE W/ SENTINEL NODE BIOPSY Left 02/23/2017   TOTAL MATECTOMY;  LEFT AXILLARY SENTINEL LYMPH NODE BIOPSY ERAS PATHWAY  . MASTECTOMY WITH RADIOACTIVE SEED GUIDED EXCISION AND AXILLARY SENTINEL LYMPH NODE BIOPSY Left 02/23/2017   Procedure: LEFT TOTAL MATECTOMY;  LEFT AXILLARY SENTINEL LYMPH NODE BIOPSY ERAS PATHWAY;  Surgeon: Excell Seltzer, MD;  Location: [facility];  Service: General;  Laterality: Left;  . PILONIDAL CYST EXCISION    . PORTA CATH INSERTION  02/23/2017  . PORTACATH  PLACEMENT N/A 02/23/2017   Procedure: INSERTION PORT-A-CATH;  Surgeon: Excell Seltzer, MD;  Location: [facility];  Service: General;  Laterality: N/A;  . SHOULDER ARTHROSCOPY W/ ROTATOR CUFF REPAIR Right     I have reviewed the social history  and family history with the patient and they are unchanged from previous note.  ALLERGIES:  has No Known Allergies.  MEDICATIONS:  Current Outpatient Medications  Medication Sig Dispense Refill  . aspirin 81 MG EC tablet Take 81 mg by mouth at bedtime.     . chlorthalidone (HYGROTON) 25 MG tablet Take 25 mg by mouth daily.     . Coenzyme Q10 (COQ10) 100 MG CAPS Take 100 mg by mouth daily.      Marland Kitchen latanoprost (XALATAN) 0.005 % ophthalmic solution Place 1 drop into both eyes at bedtime.    . lidocaine-prilocaine (EMLA) cream Apply to affected area once 30 g 3  . losartan (COZAAR) 50 MG tablet Take 50 mg by mouth daily.    . metFORMIN (GLUCOPHAGE) 500 MG tablet Take 1,000 mg by mouth daily with breakfast.     . vitamin B-12 (CYANOCOBALAMIN) 1000 MCG tablet Take 1,000 mcg by mouth daily.    . ondansetron (ZOFRAN) 8 MG tablet Take 1 tablet (8 mg total) by mouth 2 (two) times daily as needed (Nausea or vomiting). 30 tablet 1  . potassium chloride SA (K-DUR,KLOR-CON) 20 MEQ tablet Take 1 tablet (20 mEq total) by mouth daily. 30 tablet 0  . prochlorperazine (COMPAZINE) 10 MG tablet Take 1 tablet (10 mg total) by mouth every 6 (six) hours as needed (Nausea or vomiting). 30 tablet 1  . traMADol (ULTRAM) 50 MG tablet Take 1 tablet (50 mg total) by mouth every 6 (six) hours as needed (mild pain). 15 tablet 0   No current facility-administered medications for this visit.    Facility-Administered Medications Ordered in Other Visits  Medication Dose Route Frequency Provider Last Rate Last Dose  . sodium chloride flush (NS) 0.9 % injection 10 mL  10 mL Intracatheter PRN Truitt Merle, MD   10 mL at 05/26/17 1333    PHYSICAL EXAMINATION: ECOG PERFORMANCE STATUS: 1 - Symptomatic but completely ambulatory  Vitals:   05/26/17 0938  BP: (!) 145/71  Pulse: 67  Resp: 18  Temp: 98.8 F (37.1 C)  SpO2: 100%   Filed Weights   05/26/17 0938  Weight: 153 lb 4.8 oz (69.5 kg)    GENERAL:alert, no distress  and comfortable SKIN: skin color, texture, turgor are normal, no rashes or significant lesions EYES: normal, Conjunctiva are pink and non-injected, sclera clear OROPHARYNX:no exudate, no erythema and lips, buccal mucosa, and tongue normal  LYMPH:  no palpable cervical, supraclavicular, or axillary lymphadenopathy LUNGS: clear to auscultation with normal breathing effort HEART: regular rate & rhythm and no murmurs and no lower extremity edema ABDOMEN:abdomen soft, non-tender and normal bowel sounds Musculoskeletal:no cyanosis of digits and no clubbing  NEURO: alert & oriented x 3 with fluent speech, no focal motor deficits. Mildly decreased vibratory sense to feet bilaterally, R>L BREASTS: s/p left mastectomy, incision has healed well; prominent left axillary cording  PAC without erythema   LABORATORY DATA:  I have reviewed the data as listed CBC Latest Ref Rng & Units 05/26/2017 05/19/2017 05/12/2017  WBC 3.9 - 10.3 K/uL 2.2(L) 3.4(L) 3.6(L)  Hemoglobin 11.6 - 15.9 g/dL 9.9(L) 10.2(L) 10.6(L)  Hematocrit 34.8 - 46.6 % 29.1(L) 31.2(L) 31.8(L)  Platelets 145 - 400 K/uL 245 286 328  CMP Latest Ref Rng & Units 05/26/2017 05/19/2017 05/12/2017  Glucose 70 - 140 mg/dL 135 111 109  BUN 7 - 26 mg/dL 7 9 7   Creatinine 0.60 - 1.10 mg/dL 0.69 0.68 0.72  Sodium 136 - 145 mmol/L 127(L) 130(L) 131(L)  Potassium 3.5 - 5.1 mmol/L 3.2(L) 3.5 4.0  Chloride 98 - 109 mmol/L 93(L) 98 96(L)  CO2 22 - 29 mmol/L 24 28 27   Calcium 8.4 - 10.4 mg/dL 9.2 9.2 9.3  Total Protein 6.4 - 8.3 g/dL 6.3(L) 6.3(L) 6.2(L)  Total Bilirubin 0.2 - 1.2 mg/dL 0.6 0.5 0.3  Alkaline Phos 40 - 150 U/L 53 53 58  AST 5 - 34 U/L 16 16 16   ALT 0 - 55 U/L 15 16 15      RADIOGRAPHIC STUDIES: I have personally reviewed the radiological images as listed and agreed with the findings in the report. No results found.   ASSESSMENT & PLAN: Sarenity Ramaker is an 84 year old with past medical history positive for HTN, DM, CAD now with  newly diagnosed left breast cancer  1.Multifocal invasive ductal carcinoma of the left breast of female;carcinoma of the upper-outer quadrant of left breast and female, ER/PR positive, HER-2 negative pT2N0M0, G2, Stage 1B; IDC of the upper-inner quadrant left breast, ER+/PR+/HER2+, pT3N0M0 G3, stage IB 2. HTN, CAD, DM 3. Skin rash 4. Hyponatremia  5. Neutropenia, secondary to chemotherapy  6. Peripheral neuropathy, G1, secondary to taxol and DM 7. Headaches, resolved  Ms. Wheeland appears stable. She completed adjuvant cycle 2 day 8 weekly taxol/herceptin on 05/19/17 (6th dose). She is tolerating treatment moderately well. She has progressive peripheral neuropathy to her feet, increasing in a "stocking" distribution, related to DM and worsened on chemotherapy. I recommend she use cryotherapy to her feet during taxol infusion, she can take B complex vitamin for nerve health. I recommend a dose-reduction in taxol.   Labs reviewed; she developed neutropenia ANC 1.4 with cycle 1 day 22 and required 1 week treatment delay and recovered to normal. Today her neutropenia is slightly worse, ANC 1.2. Will decrease taxol and get approval for granix. Will likely add with next cycle. I discussed possible side effects. We reviewed neutropenic precautions. She is hyponatremic and hypokalemic Na 127, K 3.2. Chlorthalidone was previously held and sodium improved some so she restarted, now decreased again. I recommend she hold chlorthalidone, increase hydration and salt intake. Will start oral K supplement, 1 tab 20 mEq daily. Will monitor labs closely.   PLAN -Labs reviewed, proceed with cycle 2 day 15 weekly taxol/herceptin, dose-reduce taxol to 59m/m2 for neuropathy and recurrent neutropenia  -hold chlorthalidone, increase water and salt intake -begin oral K 20 mEq daily for hypokalemia  -get approval for granix  -Cryotherapy during taxol infusion, B complex vitamin for neuropathy  -lab, taxol/herceptin in 1  week, f/u in 2 weeks   All questions were answered. The patient knows to call the clinic with any problems, questions or concerns. No barriers to learning was detected. I spent 20 minutes counseling the patient face to face. The total time spent in the appointment was 25 minutes and more than 50% was on counseling and review of test results     LAlla Feeling NP 05/26/17

## 2017-05-25 ENCOUNTER — Ambulatory Visit: Payer: Medicare Other

## 2017-05-26 ENCOUNTER — Ambulatory Visit: Payer: Medicare Other | Admitting: Physical Therapy

## 2017-05-26 ENCOUNTER — Inpatient Hospital Stay: Payer: Medicare Other

## 2017-05-26 ENCOUNTER — Telehealth: Payer: Self-pay | Admitting: Hematology

## 2017-05-26 ENCOUNTER — Inpatient Hospital Stay (HOSPITAL_BASED_OUTPATIENT_CLINIC_OR_DEPARTMENT_OTHER): Payer: Medicare Other | Admitting: Nurse Practitioner

## 2017-05-26 ENCOUNTER — Encounter: Payer: Self-pay | Admitting: Nurse Practitioner

## 2017-05-26 VITALS — BP 145/71 | HR 67 | Temp 98.8°F | Resp 18 | Ht 64.5 in | Wt 153.3 lb

## 2017-05-26 DIAGNOSIS — R918 Other nonspecific abnormal finding of lung field: Secondary | ICD-10-CM

## 2017-05-26 DIAGNOSIS — C50412 Malignant neoplasm of upper-outer quadrant of left female breast: Secondary | ICD-10-CM

## 2017-05-26 DIAGNOSIS — M25612 Stiffness of left shoulder, not elsewhere classified: Secondary | ICD-10-CM

## 2017-05-26 DIAGNOSIS — Z5111 Encounter for antineoplastic chemotherapy: Secondary | ICD-10-CM | POA: Diagnosis not present

## 2017-05-26 DIAGNOSIS — Z483 Aftercare following surgery for neoplasm: Secondary | ICD-10-CM

## 2017-05-26 DIAGNOSIS — R293 Abnormal posture: Secondary | ICD-10-CM | POA: Diagnosis not present

## 2017-05-26 DIAGNOSIS — E876 Hypokalemia: Secondary | ICD-10-CM

## 2017-05-26 DIAGNOSIS — T451X5A Adverse effect of antineoplastic and immunosuppressive drugs, initial encounter: Secondary | ICD-10-CM

## 2017-05-26 DIAGNOSIS — Z170 Estrogen receptor positive status [ER+]: Secondary | ICD-10-CM

## 2017-05-26 DIAGNOSIS — C50212 Malignant neoplasm of upper-inner quadrant of left female breast: Secondary | ICD-10-CM

## 2017-05-26 DIAGNOSIS — E871 Hypo-osmolality and hyponatremia: Secondary | ICD-10-CM | POA: Diagnosis not present

## 2017-05-26 DIAGNOSIS — K769 Liver disease, unspecified: Secondary | ICD-10-CM

## 2017-05-26 DIAGNOSIS — Z9012 Acquired absence of left breast and nipple: Secondary | ICD-10-CM | POA: Diagnosis not present

## 2017-05-26 DIAGNOSIS — Z79899 Other long term (current) drug therapy: Secondary | ICD-10-CM | POA: Diagnosis not present

## 2017-05-26 DIAGNOSIS — G620 Drug-induced polyneuropathy: Secondary | ICD-10-CM

## 2017-05-26 DIAGNOSIS — Z95828 Presence of other vascular implants and grafts: Secondary | ICD-10-CM

## 2017-05-26 LAB — CMP (CANCER CENTER ONLY)
ALT: 15 U/L (ref 0–55)
ANION GAP: 10 (ref 3–11)
AST: 16 U/L (ref 5–34)
Albumin: 3.4 g/dL — ABNORMAL LOW (ref 3.5–5.0)
Alkaline Phosphatase: 53 U/L (ref 40–150)
BUN: 7 mg/dL (ref 7–26)
CALCIUM: 9.2 mg/dL (ref 8.4–10.4)
CO2: 24 mmol/L (ref 22–29)
Chloride: 93 mmol/L — ABNORMAL LOW (ref 98–109)
Creatinine: 0.69 mg/dL (ref 0.60–1.10)
GFR, Estimated: >60 mL/min (ref >60)
Glucose, Bld: 135 mg/dL (ref 70–140)
Potassium: 3.2 mmol/L — ABNORMAL LOW (ref 3.5–5.1)
SODIUM: 127 mmol/L — AB (ref 136–145)
Total Bilirubin: 0.6 mg/dL (ref 0.2–1.2)
Total Protein: 6.3 g/dL — ABNORMAL LOW (ref 6.4–8.3)

## 2017-05-26 LAB — CBC WITH DIFFERENTIAL (CANCER CENTER ONLY)
BASOS PCT: 1 %
Basophils Absolute: 0 10*3/uL (ref 0.0–0.1)
Eosinophils Absolute: 0.1 10*3/uL (ref 0.0–0.5)
Eosinophils Relative: 4 %
HEMATOCRIT: 29.1 % — AB (ref 34.8–46.6)
HEMOGLOBIN: 9.9 g/dL — AB (ref 11.6–15.9)
Lymphocytes Relative: 32 %
Lymphs Abs: 0.7 10*3/uL — ABNORMAL LOW (ref 0.9–3.3)
MCH: 30.8 pg (ref 25.1–34.0)
MCHC: 34.1 g/dL (ref 31.5–36.0)
MCV: 90.1 fL (ref 79.5–101.0)
MONOS PCT: 9 %
Monocytes Absolute: 0.2 10*3/uL (ref 0.1–0.9)
NEUTROS ABS: 1.2 10*3/uL — AB (ref 1.5–6.5)
NEUTROS PCT: 54 %
Platelet Count: 245 10*3/uL (ref 145–400)
RBC: 3.23 MIL/uL — AB (ref 3.70–5.45)
RDW: 14 % (ref 11.2–14.5)
WBC Count: 2.2 10*3/uL — ABNORMAL LOW (ref 3.9–10.3)

## 2017-05-26 MED ORDER — TRASTUZUMAB CHEMO 150 MG IV SOLR
150.0000 mg | Freq: Once | INTRAVENOUS | Status: AC
  Administered 2017-05-26: 150 mg via INTRAVENOUS
  Filled 2017-05-26: qty 7.1

## 2017-05-26 MED ORDER — SODIUM CHLORIDE 0.9 % IV SOLN
20.0000 mg | Freq: Once | INTRAVENOUS | Status: AC
  Administered 2017-05-26: 20 mg via INTRAVENOUS
  Filled 2017-05-26: qty 2

## 2017-05-26 MED ORDER — FAMOTIDINE IN NACL 20-0.9 MG/50ML-% IV SOLN
INTRAVENOUS | Status: AC
  Filled 2017-05-26: qty 50

## 2017-05-26 MED ORDER — ACETAMINOPHEN 325 MG PO TABS
ORAL_TABLET | ORAL | Status: AC
  Filled 2017-05-26: qty 2

## 2017-05-26 MED ORDER — ACETAMINOPHEN 325 MG PO TABS
650.0000 mg | ORAL_TABLET | Freq: Once | ORAL | Status: AC
  Administered 2017-05-26: 650 mg via ORAL

## 2017-05-26 MED ORDER — SODIUM CHLORIDE 0.9% FLUSH
10.0000 mL | INTRAVENOUS | Status: DC | PRN
  Administered 2017-05-26: 10 mL
  Filled 2017-05-26: qty 10

## 2017-05-26 MED ORDER — PACLITAXEL CHEMO INJECTION 300 MG/50ML
70.0000 mg/m2 | Freq: Once | INTRAVENOUS | Status: AC
  Administered 2017-05-26: 126 mg via INTRAVENOUS
  Filled 2017-05-26: qty 21

## 2017-05-26 MED ORDER — FAMOTIDINE IN NACL 20-0.9 MG/50ML-% IV SOLN
20.0000 mg | Freq: Once | INTRAVENOUS | Status: AC
  Administered 2017-05-26: 20 mg via INTRAVENOUS

## 2017-05-26 MED ORDER — HEPARIN SOD (PORK) LOCK FLUSH 100 UNIT/ML IV SOLN
500.0000 [IU] | Freq: Once | INTRAVENOUS | Status: AC | PRN
  Administered 2017-05-26: 500 [IU]
  Filled 2017-05-26: qty 5

## 2017-05-26 MED ORDER — SODIUM CHLORIDE 0.9 % IV SOLN
Freq: Once | INTRAVENOUS | Status: AC
  Administered 2017-05-26: 11:00:00 via INTRAVENOUS

## 2017-05-26 MED ORDER — DIPHENHYDRAMINE HCL 50 MG/ML IJ SOLN
50.0000 mg | Freq: Once | INTRAMUSCULAR | Status: AC
  Administered 2017-05-26: 50 mg via INTRAVENOUS

## 2017-05-26 MED ORDER — POTASSIUM CHLORIDE CRYS ER 20 MEQ PO TBCR: 20.0000 meq | EXTENDED_RELEASE_TABLET | Freq: Every day | ORAL | 0 refills | Status: DC

## 2017-05-26 MED ORDER — DIPHENHYDRAMINE HCL 50 MG/ML IJ SOLN
INTRAMUSCULAR | Status: AC
  Filled 2017-05-26: qty 1

## 2017-05-26 NOTE — Progress Notes (Signed)
Ok to treat with 05/26/17 lab results per Garry Heater, NP.

## 2017-05-26 NOTE — Patient Instructions (Addendum)
[facility] Discharge Instructions for Patients Receiving Chemotherapy  Today you received the following chemotherapy agents: Herceptin & Taxol.   To help prevent nausea and vomiting after your treatment, we encourage you to take your nausea medication as directed.    If you develop nausea and vomiting that is not controlled by your nausea medication, call the clinic.   BELOW ARE SYMPTOMS THAT SHOULD BE REPORTED IMMEDIATELY:  *FEVER GREATER THAN 100.5 F  *CHILLS WITH OR WITHOUT FEVER  NAUSEA AND VOMITING THAT IS NOT CONTROLLED WITH YOUR NAUSEA MEDICATION  *UNUSUAL SHORTNESS OF BREATH  *UNUSUAL BRUISING OR BLEEDING  TENDERNESS IN MOUTH AND THROAT WITH OR WITHOUT PRESENCE OF ULCERS  *URINARY PROBLEMS  *BOWEL PROBLEMS  UNUSUAL RASH Items with * indicate a potential emergency and should be followed up as soon as possible.  Feel free to call the clinic should you have any questions or concerns. The clinic phone number is [phone].  Please show the CHEMO ALERT CARD at check-in to the Emergency Department and triage nurse.   

## 2017-05-26 NOTE — Telephone Encounter (Signed)
Appointments scheduled Calendar /letter mailed to patient per 4/24 los

## 2017-05-26 NOTE — Therapy (Signed)
[place], [place], [address] Phone: [phone]   Fax:  [phone]  Physical Therapy Treatment  Patient Details  Name: Penny Huynh MRN: [id number] Date of Birth: 04/25/1933 Referring Provider: [name]   Encounter Date: 05/26/2017  PT End of Session - 05/26/17 1723    Visit Number  12    Number of Visits  19    Date for PT Re-Evaluation  06/18/17    PT Start Time  1435    PT Stop Time  1517    PT Time Calculation (min)  42 min    Activity Tolerance  Patient tolerated treatment well    Behavior During Therapy  [facility] for tasks assessed/performed       Past Medical History:  Diagnosis Date  . Arthritis    "maybe a little in my left knee" (02/23/2017)  . Asthmatic bronchitis   . Cancer of left breast ([place])   . Coronary atherosclerosis    Minor nonobstructive CAD at cardiac catheterization 2009  . Dyslipidemia   . Femoral bruit 7/08   With normal ABIs  . Hypertension   . Lower extremity edema    Chronic  . Pneumonia ~ 1950   in 8th grade  . Type II diabetes mellitus ([place])     Past Surgical History:  Procedure Laterality Date  . APPENDECTOMY    . BREAST BIOPSY Left 02/2017  . CARDIAC CATHETERIZATION     "long time ago" (02/23/2017)  . MASTECTOMY COMPLETE / SIMPLE W/ SENTINEL NODE BIOPSY Left 02/23/2017   TOTAL MATECTOMY;  LEFT AXILLARY SENTINEL LYMPH NODE BIOPSY ERAS PATHWAY  . MASTECTOMY WITH RADIOACTIVE SEED GUIDED EXCISION AND AXILLARY SENTINEL LYMPH NODE BIOPSY Left 02/23/2017   Procedure: LEFT TOTAL MATECTOMY;  LEFT AXILLARY SENTINEL LYMPH NODE BIOPSY ERAS PATHWAY;  Surgeon: Excell Seltzer, MD;  Location: [place];  Service: General;  Laterality: Left;  . PILONIDAL CYST EXCISION    . PORTA CATH INSERTION  02/23/2017  . PORTACATH PLACEMENT N/A 02/23/2017   Procedure: INSERTION PORT-A-CATH;  Surgeon: Excell Seltzer, MD;  Location: [place];  Service: General;   Laterality: N/A;  . SHOULDER ARTHROSCOPY W/ ROTATOR CUFF REPAIR Right     There were no vitals filed for this visit.  Subjective Assessment - 05/26/17 1437    Subjective  "Just had my chemo.  I went pretty good. I'm getting some neuropathy.  They had me put my feet in ice during chemo today." Wearing her sleeve, "but it makes my hand swell."    Pertinent History  Patient underwent a left mastectomy and sentinel node biopsy (6 negative nodes) on 02/23/17. She will begin chemotherapy 03/31/17 as long as drain is removed today. She has 2 different breast cancers. Both are ER/PR positive but one is HER2 negative and one is HER2 positive. History of right shoulder scope 20 years ago and has diabetes and hypertension, both of which are well controlled.    Currently in Pain?  No/denies                       [facility] Adult PT Treatment/Exercise - 05/26/17 0001      Self-Care   Other Self-Care Comments   Gave patient instructions about how to obtain a compression glove      Manual Therapy   Soft tissue mobilization  In left axilla at tight tissue and at thin cording palpable near there. Also for a slight cording at left  antecubital fossa.; also for area at left mastectomy scar    Myofascial Release  across left axilla with hands a varying proximities to each other, in diagonals; crosshands across left mastectomy scar in vertical and diagonal as well as horizontal    Passive ROM  in supine for left shoulder er, abduction, and fleion             PT Education - 05/26/17 1726    Education provided  Yes    Education Details  about compression gloves, where and how to obtain    Person(s) Educated  Patient    Methods  Explanation;Handout    Comprehension  Verbalized understanding          PT Long Term Goals - 05/17/17 1432      PT LONG TERM GOAL #1   Title  Patient will be independent in her home exercise program for shoulder ROM.    Baseline  Pt is independent with inital  HEP for stretching and began postural strength today-04/13/17    Status  Partially Met      PT LONG TERM GOAL #2   Title  Patient will demonstrate shoulder flexion AROM to be >/= 140 degrees for increased ease reaching overhead.    Baseline  65 degrees; 131 degrees - 04/13/17, 04/19/17- 130; 141 on 05/17/17    Status  Achieved      PT LONG TERM GOAL #3   Title  Patient will demonstrate shoulder abduction AROM to be >/= 140 degrees for increased ease reaching overhead.    Status  Achieved      PT LONG TERM GOAL #4   Title  Patient will score </= 10 on the DASH indicating increased shoulder function.    Status  On-going      PT LONG TERM GOAL #5   Title  Patient will verbalize she understands lymphedema risk reduction.    Baseline  04/19/17- re educated pt in risk reduction handout; gave handout on 05/17/17    Status  On-going      Breast Clinic Goals - 02/08/17 2024      Patient will be able to verbalize understanding of pertinent lymphedema risk reduction practices relevant to her diagnosis specifically related to skin care.   Status  Achieved      Patient will be able to return demonstrate and/or verbalize understanding of the post-op home exercise program related to regaining shoulder range of motion.   Status  Achieved      Patient will be able to verbalize understanding of the importance of attending the postoperative After Breast Cancer Class for further lymphedema risk reduction education and therapeutic exercise.   Status  Achieved           Plan - 05/26/17 1723    Clinical Impression Statement  Did well with further manual work today. Still with visible and palpable cording and muscle tightness. Pt. has been wearing her compression sleeve and now finds that her hand is swelling, so suggested she get a compression glove to wear for that.    Rehab Potential  Excellent    PT Frequency  2x / week    PT Duration  4 weeks    PT Treatment/Interventions  ADLs/Self Care Home  Management;Therapeutic exercise;Patient/family education;Orthotic Fit/Training;Manual techniques;Passive range of motion;Manual lymph drainage;Scar mobilization;DME Instruction    PT Next Visit Plan  Focus on soft tissue mobilization and myofascial release for cording of left axilla and elbow as well as left shoulder ROM.  PT Home Exercise Plan  focus on stretches to try to release cording; supine scapular series  Pt. returned to her previous exercise program at her church as of 05/17/17.    Consulted and Agree with Plan of Care  Patient       Patient will benefit from skilled therapeutic intervention in order to improve the following deficits and impairments:  Postural dysfunction, Decreased knowledge of precautions, Decreased knowledge of use of DME, Impaired UE functional use, Decreased range of motion, Decreased strength, Increased fascial restricitons, Decreased scar mobility  Visit Diagnosis: Aftercare following surgery for neoplasm  Stiffness of left shoulder, not elsewhere classified     Problem List Patient Active Problem List   Diagnosis Date Noted  . Port-A-Cath in place 04/14/2017  . Stage II breast cancer, left ([place]) 02/23/2017  . Carcinoma of upper-outer quadrant of left breast in female, estrogen receptor positive ([place]) 02/10/2017  . Carcinoma of upper-inner quadrant of left breast in female, estrogen receptor positive ([place]) 02/10/2017  . Chest pain 07/18/2015  . Hypokalemia 07/18/2015  . Vertigo 07/18/2015  . LBBB (left bundle branch block) 05/28/2009  . AODM 12/21/2007  . Essential hypertension, benign 12/21/2007  . DIASTOLIC DYSFUNCTION 16/11/9602  . FEMORAL BRUIT, RIGHT 12/21/2007  . DYSPNEA 12/21/2007    Anai Lipson 05/26/2017, 5:27 PM  [place], [place], [address] Phone: [phone]   Fax:  [phone]  Name: CECE MILHOUSE MRN: [id number] Date of Birth:  [birth year]  Serafina Royals, PT 05/26/17 5:28 PM

## 2017-05-26 NOTE — Patient Instructions (Signed)
Implanted Port Home Guide An implanted port is a type of central line that is placed under the skin. Central lines are used to provide IV access when treatment or nutrition needs to be given through a person's veins. Implanted ports are used for long-term IV access. An implanted port may be placed because:  You need IV medicine that would be irritating to the small veins in your hands or arms.  You need long-term IV medicines, such as antibiotics.  You need IV nutrition for a long period.  You need frequent blood draws for lab tests.  You need dialysis.  Implanted ports are usually placed in the chest area, but they can also be placed in the upper arm, the abdomen, or the leg. An implanted port has two main parts:  Reservoir. The reservoir is round and will appear as a small, raised area under your skin. The reservoir is the part where a needle is inserted to give medicines or draw blood.  Catheter. The catheter is a thin, flexible tube that extends from the reservoir. The catheter is placed into a large vein. Medicine that is inserted into the reservoir goes into the catheter and then into the vein.  How will I care for my incision site? Do not get the incision site wet. Bathe or shower as directed by your health care provider. How is my port accessed? Special steps must be taken to access the port:  Before the port is accessed, a numbing cream can be placed on the skin. This helps numb the skin over the port site.  Your health care provider uses a sterile technique to access the port. ? Your health care provider must put on a mask and sterile gloves. ? The skin over your port is cleaned carefully with an antiseptic and allowed to dry. ? The port is gently pinched between sterile gloves, and a needle is inserted into the port.  Only "non-coring" port needles should be used to access the port. Once the port is accessed, a blood return should be checked. This helps ensure that the port  is in the vein and is not clogged.  If your port needs to remain accessed for a constant infusion, a clear (transparent) bandage will be placed over the needle site. The bandage and needle will need to be changed every week, or as directed by your health care provider.  Keep the bandage covering the needle clean and dry. Do not get it wet. Follow your health care provider's instructions on how to take a shower or bath while the port is accessed.  If your port does not need to stay accessed, no bandage is needed over the port.  What is flushing? Flushing helps keep the port from getting clogged. Follow your health care provider's instructions on how and when to flush the port. Ports are usually flushed with saline solution or a medicine called heparin. The need for flushing will depend on how the port is used.  If the port is used for intermittent medicines or blood draws, the port will need to be flushed: ? After medicines have been given. ? After blood has been drawn. ? As part of routine maintenance.  If a constant infusion is running, the port may not need to be flushed.  How long will my port stay implanted? The port can stay in for as long as your health care provider thinks it is needed. When it is time for the port to come out, surgery will be   done to remove it. The procedure is similar to the one performed when the port was put in. When should I seek immediate medical care? When you have an implanted port, you should seek immediate medical care if:  You notice a bad smell coming from the incision site.  You have swelling, redness, or drainage at the incision site.  You have more swelling or pain at the port site or the surrounding area.  You have a fever that is not controlled with medicine.  This information is not intended to replace advice given to you by your health care provider. Make sure you discuss any questions you have with your health care provider. Document  Released: 01/19/2005 Document Revised: 06/27/2015 Document Reviewed: 09/26/2012 Elsevier Interactive Patient Education  2017 Elsevier Inc.  

## 2017-05-28 ENCOUNTER — Ambulatory Visit: Payer: Medicare Other | Admitting: Physical Therapy

## 2017-05-28 DIAGNOSIS — C50919 Malignant neoplasm of unspecified site of unspecified female breast: Secondary | ICD-10-CM | POA: Diagnosis not present

## 2017-05-28 DIAGNOSIS — Z483 Aftercare following surgery for neoplasm: Secondary | ICD-10-CM

## 2017-05-28 DIAGNOSIS — M25612 Stiffness of left shoulder, not elsewhere classified: Secondary | ICD-10-CM

## 2017-05-28 DIAGNOSIS — R293 Abnormal posture: Secondary | ICD-10-CM | POA: Diagnosis not present

## 2017-05-28 DIAGNOSIS — C50912 Malignant neoplasm of unspecified site of left female breast: Secondary | ICD-10-CM | POA: Diagnosis not present

## 2017-05-28 NOTE — Therapy (Signed)
[place], [place], [address] Phone: [phone]   Fax:  [phone]  Physical Therapy Treatment  Patient Details  Name: Penny Huynh MRN: [id number] Date of Birth: 11/08/1933 Referring Provider: [name]   Encounter Date: 05/28/2017  PT End of Session - 05/28/17 1224    Visit Number  13    Number of Visits  19    Date for PT Re-Evaluation  06/18/17    PT Start Time  1022    PT Stop Time  1105    PT Time Calculation (min)  43 min    Activity Tolerance  Patient tolerated treatment well    Behavior During Therapy  [facility] for tasks assessed/performed       Past Medical History:  Diagnosis Date  . Arthritis    "maybe a little in my left knee" (02/23/2017)  . Asthmatic bronchitis   . Cancer of left breast ([place])   . Coronary atherosclerosis    Minor nonobstructive CAD at cardiac catheterization 2009  . Dyslipidemia   . Femoral bruit 7/08   With normal ABIs  . Hypertension   . Lower extremity edema    Chronic  . Pneumonia ~ 1950   in 8th grade  . Type II diabetes mellitus ([place])     Past Surgical History:  Procedure Laterality Date  . APPENDECTOMY    . BREAST BIOPSY Left 02/2017  . CARDIAC CATHETERIZATION     "long time ago" (02/23/2017)  . MASTECTOMY COMPLETE / SIMPLE W/ SENTINEL NODE BIOPSY Left 02/23/2017   TOTAL MATECTOMY;  LEFT AXILLARY SENTINEL LYMPH NODE BIOPSY ERAS PATHWAY  . MASTECTOMY WITH RADIOACTIVE SEED GUIDED EXCISION AND AXILLARY SENTINEL LYMPH NODE BIOPSY Left 02/23/2017   Procedure: LEFT TOTAL MATECTOMY;  LEFT AXILLARY SENTINEL LYMPH NODE BIOPSY ERAS PATHWAY;  Surgeon: Excell Seltzer, MD;  Location: [place];  Service: General;  Laterality: Left;  . PILONIDAL CYST EXCISION    . PORTA CATH INSERTION  02/23/2017  . PORTACATH PLACEMENT N/A 02/23/2017   Procedure: INSERTION PORT-A-CATH;  Surgeon: Excell Seltzer, MD;  Location: [place];  Service: General;   Laterality: N/A;  . SHOULDER ARTHROSCOPY W/ ROTATOR CUFF REPAIR Right     There were no vitals filed for this visit.  Subjective Assessment - 05/28/17 1024    Subjective  "I don't know if I'm going to be able to do therapy, I'm so sore. My potassium is low, my sodium is low, my white blood cell count is low. I just don't have any energy. And I can' find my sleeve."  "I missed my appointment with [name]; I don't know how I missed it." "I can't raise my arm."    Pertinent History  Patient underwent a left mastectomy and sentinel node biopsy (6 negative nodes) on 02/23/17. She will begin chemotherapy 03/31/17 as long as drain is removed today. She has 2 different breast cancers. Both are ER/PR positive but one is HER2 negative and one is HER2 positive. History of right shoulder scope 20 years ago and has diabetes and hypertension, both of which are well controlled.    Currently in Pain?  Yes    Pain Score  6     Pain Location  Chest and axilla area    Pain Orientation  Left;Upper    Pain Descriptors / Indicators  Sore    Aggravating Factors   she isn't sure; noticed it last night when she took her snug bra off; trying to  raise left arm    Pain Relieving Factors  nothing                       [facility] Adult PT Treatment/Exercise - 05/28/17 0001      Manual Therapy   Manual Therapy  Scapular mobilization;Other (comment)    Soft tissue mobilization  in left axilla to cording    Myofascial Release  crosshands from left upper chest to left abdomen; also from left upper arm to left flank    Scapular Mobilization  gently rocking left scapula in right sidelying    Manual Lymphatic Drainage (MLD)  right axilla and anterior interaxillary anastomosis in supine, then posterior interaxillary anastomosis in right sidelying    Passive ROM  in supine for left shoulder er, abduction, and flexion decreased tolerance due to left chest soreness today    Other Manual Therapy  soft tissue work  with biotone briefly to left pect area this felt swollen, so changed to manual lymph drainage                  PT Long Term Goals - 05/17/17 1432      PT LONG TERM GOAL #1   Title  Patient will be independent in her home exercise program for shoulder ROM.    Baseline  Pt is independent with inital HEP for stretching and began postural strength today-04/13/17    Status  Partially Met      PT LONG TERM GOAL #2   Title  Patient will demonstrate shoulder flexion AROM to be >/= 140 degrees for increased ease reaching overhead.    Baseline  65 degrees; 131 degrees - 04/13/17, 04/19/17- 130; 141 on 05/17/17    Status  Achieved      PT LONG TERM GOAL #3   Title  Patient will demonstrate shoulder abduction AROM to be >/= 140 degrees for increased ease reaching overhead.    Status  Achieved      PT LONG TERM GOAL #4   Title  Patient will score </= 10 on the DASH indicating increased shoulder function.    Status  On-going      PT LONG TERM GOAL #5   Title  Patient will verbalize she understands lymphedema risk reduction.    Baseline  04/19/17- re educated pt in risk reduction handout; gave handout on 05/17/17    Status  On-going      Breast Clinic Goals - 02/08/17 2024      Patient will be able to verbalize understanding of pertinent lymphedema risk reduction practices relevant to her diagnosis specifically related to skin care.   Status  Achieved      Patient will be able to return demonstrate and/or verbalize understanding of the post-op home exercise program related to regaining shoulder range of motion.   Status  Achieved      Patient will be able to verbalize understanding of the importance of attending the postoperative After Breast Cancer Class for further lymphedema risk reduction education and therapeutic exercise.   Status  Achieved           Plan - 05/28/17 1224    Clinical Impression Statement  Pt. was not feeling very well today. She had soreness at left upper  outer chest and upper flank. It seemed she had swelling in these areas. She planned to stop by the surgeon's office to see if they felt she needed another aspiration. She said she still had soreness at  end of session, but that it felt better than it had. Cording at right axilla is still present, but seemed to loosen today, though no "pop" was felt.    Rehab Potential  Excellent    PT Frequency  2x / week    PT Duration  4 weeks    PT Treatment/Interventions  ADLs/Self Care Home Management;Therapeutic exercise;Patient/family education;Orthotic Fit/Training;Manual techniques;Passive range of motion;Manual lymph drainage;Scar mobilization;DME Instruction    PT Next Visit Plan  See if she had any fluid aspirated. Focus on soft tissue mobilization and myofascial release for cording of left axilla and elbow as well as left shoulder ROM.    PT Home Exercise Plan  focus on stretches to try to release cording; supine scapular series  Pt. returned to her previous exercise program at her church as of 05/17/17.    Consulted and Agree with Plan of Care  Patient       Patient will benefit from skilled therapeutic intervention in order to improve the following deficits and impairments:  Postural dysfunction, Decreased knowledge of precautions, Decreased knowledge of use of DME, Impaired UE functional use, Decreased range of motion, Decreased strength, Increased fascial restricitons, Decreased scar mobility  Visit Diagnosis: Aftercare following surgery for neoplasm  Stiffness of left shoulder, not elsewhere classified     Problem List Patient Active Problem List   Diagnosis Date Noted  . Port-A-Cath in place 04/14/2017  . Stage II breast cancer, left ([place]) 02/23/2017  . Carcinoma of upper-outer quadrant of left breast in female, estrogen receptor positive ([place]) 02/10/2017  . Carcinoma of upper-inner quadrant of left breast in female, estrogen receptor positive ([place]) 02/10/2017  . Chest pain 07/18/2015  .  Hypokalemia 07/18/2015  . Vertigo 07/18/2015  . LBBB (left bundle branch block) 05/28/2009  . AODM 12/21/2007  . Essential hypertension, benign 12/21/2007  . DIASTOLIC DYSFUNCTION 31/43/8887  . FEMORAL BRUIT, RIGHT 12/21/2007  . DYSPNEA 12/21/2007    SALIS[place],DONNA 05/28/2017, 12:28 PM  [place], [place], [address] Phone: [phone]   Fax:  [phone]  Name: Penny Huynh MRN: [id number] Date of Birth: Mar 10, 1933  Serafina Royals, PT 05/28/17 12:28 PM

## 2017-06-01 ENCOUNTER — Telehealth: Payer: Self-pay | Admitting: Cardiovascular Disease

## 2017-06-01 ENCOUNTER — Ambulatory Visit: Payer: Medicare Other | Admitting: Physical Therapy

## 2017-06-01 DIAGNOSIS — Z483 Aftercare following surgery for neoplasm: Secondary | ICD-10-CM | POA: Diagnosis not present

## 2017-06-01 DIAGNOSIS — M25612 Stiffness of left shoulder, not elsewhere classified: Secondary | ICD-10-CM | POA: Diagnosis not present

## 2017-06-01 DIAGNOSIS — R293 Abnormal posture: Secondary | ICD-10-CM

## 2017-06-01 NOTE — Telephone Encounter (Signed)
Pt c/o SOB says she gets winded so easy when performing normal tasks for the last week or so - denies chest pain/dizziness - says she has had some swelling in the hands BP 140/70 HR 70s-80s

## 2017-06-01 NOTE — Telephone Encounter (Signed)
Patient called stating that she went to Physical Therapy today.  She was advised to contact Dr. Bronson Ing and let him know that she is having shortness of breath. She requested that she be contacted on her cell phone.

## 2017-06-01 NOTE — Therapy (Signed)
[place], [place], [address] Phone: [phone]   Fax:  [phone]  Physical Therapy Treatment  Patient Details  Name: Penny Huynh MRN: [id number] Date of Birth: [birth year] Referring Provider: [name]   Encounter Date: 06/01/2017  PT End of Session - 06/01/17 1348    Visit Number  14    Date for PT Re-Evaluation  06/18/17    PT Start Time  1301    PT Stop Time  1348    PT Time Calculation (min)  47 min    Activity Tolerance  Patient tolerated treatment well    Behavior During Therapy  [facility] for tasks assessed/performed       Past Medical History:  Diagnosis Date  . Arthritis    "maybe a little in my left knee" (02/23/2017)  . Asthmatic bronchitis   . Cancer of left breast ([place])   . Coronary atherosclerosis    Minor nonobstructive CAD at cardiac catheterization 2009  . Dyslipidemia   . Femoral bruit 7/08   With normal ABIs  . Hypertension   . Lower extremity edema    Chronic  . Pneumonia ~ 1950   in 8th grade  . Type II diabetes mellitus ([place])     Past Surgical History:  Procedure Laterality Date  . APPENDECTOMY    . BREAST BIOPSY Left 02/2017  . CARDIAC CATHETERIZATION     "long time ago" (02/23/2017)  . MASTECTOMY COMPLETE / SIMPLE W/ SENTINEL NODE BIOPSY Left 02/23/2017   TOTAL MATECTOMY;  LEFT AXILLARY SENTINEL LYMPH NODE BIOPSY ERAS PATHWAY  . MASTECTOMY WITH RADIOACTIVE SEED GUIDED EXCISION AND AXILLARY SENTINEL LYMPH NODE BIOPSY Left 02/23/2017   Procedure: LEFT TOTAL MATECTOMY;  LEFT AXILLARY SENTINEL LYMPH NODE BIOPSY ERAS PATHWAY;  Surgeon: Excell Seltzer, MD;  Location: [place];  Service: General;  Laterality: Left;  . PILONIDAL CYST EXCISION    . PORTA CATH INSERTION  02/23/2017  . PORTACATH PLACEMENT N/A 02/23/2017   Procedure: INSERTION PORT-A-CATH;  Surgeon: Excell Seltzer, MD;  Location: [place];  Service: General;  Laterality: N/A;  . SHOULDER  ARTHROSCOPY W/ ROTATOR CUFF REPAIR Right     There were no vitals filed for this visit.  Subjective Assessment - 06/01/17 1303    Subjective  After I left here Friday I went to Dr. Lear Ng office. They drew off 120 ccs of fluid and I also had a temperature.  They started me on an antibiotic. I have three more days of it. She had already ordered a glove. the cord is still in there, but she feels like it's better.  Just in the last week she has noticed some problems with her balance--not lightheaded, just balance problems.    Pertinent History  Patient underwent a left mastectomy and sentinel node biopsy (6 negative nodes) on 02/23/17. She will begin chemotherapy 03/31/17 as long as drain is removed today. She has 2 different breast cancers. Both are ER/PR positive but one is HER2 negative and one is HER2 positive. History of right shoulder scope 20 years ago and has diabetes and hypertension, both of which are well controlled.    Patient Stated Goals  See if my arm is ok    Currently in Pain?  Yes    Pain Score  9     Pain Location  Breast    Pain Orientation  Left;Lateral    Pain Descriptors / Indicators  Sharp;Shooting    Pain Frequency  Intermittent  Aggravating Factors   not predictable    Pain Relieving Factors  it's quick and brief                       [facility] Adult PT Treatment/Exercise - 06/01/17 0001      Manual Therapy   Soft tissue mobilization  around left mastectomy scar, working mainly indirectly due to tenderness just superior to scar    Myofascial Release  crosshands across left axilla/pect tightness; crosshands across mastectomy scar in vertical and diagonal--hands at upper arm and lower axilla, upper axilla and lower axilla, upper arm and abdomen    Passive ROM  in supine for left shoulder er, abduction, and flexion      Prosthetics   Current prosthetic wear tolerance (#hours/day)                      PT Long Term Goals - 06/01/17 1310       PT LONG TERM GOAL #1   Title  Patient will be independent in her home exercise program for shoulder ROM.    Status  Achieved      PT LONG TERM GOAL #5   Title  Patient will verbalize she understands lymphedema risk reduction.    Status  Achieved      Breast Clinic Goals - 02/08/17 2024      Patient will be able to verbalize understanding of pertinent lymphedema risk reduction practices relevant to her diagnosis specifically related to skin care.   Status  Achieved      Patient will be able to return demonstrate and/or verbalize understanding of the post-op home exercise program related to regaining shoulder range of motion.   Status  Achieved      Patient will be able to verbalize understanding of the importance of attending the postoperative After Breast Cancer Class for further lymphedema risk reduction education and therapeutic exercise.   Status  Achieved           Plan - 06/01/17 1310    Clinical Impression Statement  Pt. feeling better today but with some new complaints.  She has been getting short of breath easily, so agreed to call her cardioloigst about this today. She did have fluid drained from surgical site and was put on an antiobiotic last Thursday or so.  She still has some tenderness just superior to mastectomy incision. Still with tightness at left pect, and she tolerates vigorous stretching to this area.  She is meeting goals, though has not fully met all of them to date.     Rehab Potential  Excellent    PT Frequency  2x / week    PT Duration  4 weeks    PT Treatment/Interventions  ADLs/Self Care Home Management;Therapeutic exercise;Patient/family education;Orthotic Fit/Training;Manual techniques;Passive range of motion;Manual lymph drainage;Scar mobilization;DME Instruction    PT Next Visit Plan  Repeat quick DASH. Check balance with Berg and/or TUG. Continue manual work on left chest and axilla soft tissue tightness and immobility.    PT Home Exercise Plan   focus on stretches to try to release cording; supine scapular series  Pt. returned to her previous exercise program at her church as of 05/17/17.    Consulted and Agree with Plan of Care  Patient       Patient will benefit from skilled therapeutic intervention in order to improve the following deficits and impairments:  Postural dysfunction, Decreased knowledge of precautions, Decreased knowledge of use of DME,  Impaired UE functional use, Decreased range of motion, Decreased strength, Increased fascial restricitons, Decreased scar mobility  Visit Diagnosis: Aftercare following surgery for neoplasm  Stiffness of left shoulder, not elsewhere classified  Abnormal posture     Problem List Patient Active Problem List   Diagnosis Date Noted  . Port-A-Cath in place 04/14/2017  . Stage II breast cancer, left ([place]) 02/23/2017  . Carcinoma of upper-outer quadrant of left breast in female, estrogen receptor positive ([place]) 02/10/2017  . Carcinoma of upper-inner quadrant of left breast in female, estrogen receptor positive ([place]) 02/10/2017  . Chest pain 07/18/2015  . Hypokalemia 07/18/2015  . Vertigo 07/18/2015  . LBBB (left bundle branch block) 05/28/2009  . AODM 12/21/2007  . Essential hypertension, benign 12/21/2007  . DIASTOLIC DYSFUNCTION 37/85/8850  . FEMORAL BRUIT, RIGHT 12/21/2007  . DYSPNEA 12/21/2007    SALISBURY,DONNA 06/01/2017, 1:53 PM  [place], [place], [address] Phone: [phone]   Fax:  [phone]  Name: Penny Huynh MRN: [id number] Date of Birth: 11/12/1933  Serafina Royals, PT 06/01/17 1:54 PM

## 2017-06-02 ENCOUNTER — Inpatient Hospital Stay: Payer: Medicare Other | Attending: Nurse Practitioner

## 2017-06-02 ENCOUNTER — Inpatient Hospital Stay: Payer: Medicare Other

## 2017-06-02 VITALS — BP 165/74 | HR 66 | Temp 98.7°F | Resp 18

## 2017-06-02 DIAGNOSIS — R21 Rash and other nonspecific skin eruption: Secondary | ICD-10-CM | POA: Insufficient documentation

## 2017-06-02 DIAGNOSIS — I10 Essential (primary) hypertension: Secondary | ICD-10-CM | POA: Diagnosis not present

## 2017-06-02 DIAGNOSIS — Z95828 Presence of other vascular implants and grafts: Secondary | ICD-10-CM

## 2017-06-02 DIAGNOSIS — I2510 Atherosclerotic heart disease of native coronary artery without angina pectoris: Secondary | ICD-10-CM | POA: Insufficient documentation

## 2017-06-02 DIAGNOSIS — Z5111 Encounter for antineoplastic chemotherapy: Secondary | ICD-10-CM | POA: Insufficient documentation

## 2017-06-02 DIAGNOSIS — Z170 Estrogen receptor positive status [ER+]: Secondary | ICD-10-CM | POA: Insufficient documentation

## 2017-06-02 DIAGNOSIS — Z79899 Other long term (current) drug therapy: Secondary | ICD-10-CM | POA: Diagnosis not present

## 2017-06-02 DIAGNOSIS — C50212 Malignant neoplasm of upper-inner quadrant of left female breast: Secondary | ICD-10-CM | POA: Diagnosis not present

## 2017-06-02 DIAGNOSIS — C50412 Malignant neoplasm of upper-outer quadrant of left female breast: Secondary | ICD-10-CM | POA: Diagnosis not present

## 2017-06-02 DIAGNOSIS — E871 Hypo-osmolality and hyponatremia: Secondary | ICD-10-CM | POA: Insufficient documentation

## 2017-06-02 DIAGNOSIS — I890 Lymphedema, not elsewhere classified: Secondary | ICD-10-CM | POA: Insufficient documentation

## 2017-06-02 DIAGNOSIS — R300 Dysuria: Secondary | ICD-10-CM | POA: Diagnosis not present

## 2017-06-02 DIAGNOSIS — Z9012 Acquired absence of left breast and nipple: Secondary | ICD-10-CM | POA: Diagnosis not present

## 2017-06-02 DIAGNOSIS — E119 Type 2 diabetes mellitus without complications: Secondary | ICD-10-CM | POA: Insufficient documentation

## 2017-06-02 LAB — CMP (CANCER CENTER ONLY)
ALT: 11 U/L (ref 0–55)
AST: 14 U/L (ref 5–34)
Albumin: 3.2 g/dL — ABNORMAL LOW (ref 3.5–5.0)
Alkaline Phosphatase: 52 U/L (ref 40–150)
Anion gap: 7 (ref 3–11)
BUN: 12 mg/dL (ref 7–26)
CO2: 26 mmol/L (ref 22–29)
CREATININE: 0.76 mg/dL (ref 0.60–1.10)
Calcium: 9.3 mg/dL (ref 8.4–10.4)
Chloride: 94 mmol/L — ABNORMAL LOW (ref 98–109)
Glucose, Bld: 116 mg/dL (ref 70–140)
Potassium: 3.5 mmol/L (ref 3.5–5.1)
Sodium: 127 mmol/L — ABNORMAL LOW (ref 136–145)
TOTAL PROTEIN: 6.1 g/dL — AB (ref 6.4–8.3)
Total Bilirubin: 0.3 mg/dL (ref 0.2–1.2)

## 2017-06-02 LAB — CBC WITH DIFFERENTIAL (CANCER CENTER ONLY)
BASOS ABS: 0 10*3/uL (ref 0.0–0.1)
Basophils Relative: 1 %
Eosinophils Absolute: 0.1 10*3/uL (ref 0.0–0.5)
Eosinophils Relative: 4 %
HEMATOCRIT: 28 % — AB (ref 34.8–46.6)
Hemoglobin: 9.3 g/dL — ABNORMAL LOW (ref 11.6–15.9)
LYMPHS ABS: 1 10*3/uL (ref 0.9–3.3)
Lymphocytes Relative: 28 %
MCH: 30.4 pg (ref 25.1–34.0)
MCHC: 33.2 g/dL (ref 31.5–36.0)
MCV: 91.5 fL (ref 79.5–101.0)
MONO ABS: 0.5 10*3/uL (ref 0.1–0.9)
Monocytes Relative: 13 %
NEUTROS ABS: 2 10*3/uL (ref 1.5–6.5)
Neutrophils Relative %: 54 %
Platelet Count: 319 10*3/uL (ref 145–400)
RBC: 3.06 MIL/uL — AB (ref 3.70–5.45)
RDW: 13.9 % (ref 11.2–14.5)
WBC: 3.6 10*3/uL — AB (ref 3.9–10.3)

## 2017-06-02 MED ORDER — SODIUM CHLORIDE 0.9% FLUSH
10.0000 mL | INTRAVENOUS | Status: DC | PRN
  Administered 2017-06-02: 10 mL
  Filled 2017-06-02: qty 10

## 2017-06-02 MED ORDER — FAMOTIDINE IN NACL 20-0.9 MG/50ML-% IV SOLN
INTRAVENOUS | Status: AC
  Filled 2017-06-02: qty 50

## 2017-06-02 MED ORDER — DEXAMETHASONE SODIUM PHOSPHATE 100 MG/10ML IJ SOLN
20.0000 mg | Freq: Once | INTRAMUSCULAR | Status: AC
  Administered 2017-06-02: 20 mg via INTRAVENOUS
  Filled 2017-06-02: qty 2

## 2017-06-02 MED ORDER — TRASTUZUMAB CHEMO 150 MG IV SOLR
150.0000 mg | Freq: Once | INTRAVENOUS | Status: AC
  Administered 2017-06-02: 150 mg via INTRAVENOUS
  Filled 2017-06-02: qty 7.14

## 2017-06-02 MED ORDER — HEPARIN SOD (PORK) LOCK FLUSH 100 UNIT/ML IV SOLN
500.0000 [IU] | Freq: Once | INTRAVENOUS | Status: AC | PRN
  Administered 2017-06-02: 500 [IU]
  Filled 2017-06-02: qty 5

## 2017-06-02 MED ORDER — FAMOTIDINE IN NACL 20-0.9 MG/50ML-% IV SOLN
20.0000 mg | Freq: Once | INTRAVENOUS | Status: AC
  Administered 2017-06-02: 20 mg via INTRAVENOUS

## 2017-06-02 MED ORDER — SODIUM CHLORIDE 0.9 % IV SOLN
Freq: Once | INTRAVENOUS | Status: AC
  Administered 2017-06-02: 14:00:00 via INTRAVENOUS

## 2017-06-02 MED ORDER — ACETAMINOPHEN 325 MG PO TABS
650.0000 mg | ORAL_TABLET | Freq: Once | ORAL | Status: AC
  Administered 2017-06-02: 650 mg via ORAL

## 2017-06-02 MED ORDER — ACETAMINOPHEN 325 MG PO TABS
ORAL_TABLET | ORAL | Status: AC
  Filled 2017-06-02: qty 2

## 2017-06-02 MED ORDER — DIPHENHYDRAMINE HCL 50 MG/ML IJ SOLN
INTRAMUSCULAR | Status: AC
  Filled 2017-06-02: qty 1

## 2017-06-02 MED ORDER — DIPHENHYDRAMINE HCL 50 MG/ML IJ SOLN
50.0000 mg | Freq: Once | INTRAMUSCULAR | Status: AC
  Administered 2017-06-02: 50 mg via INTRAVENOUS

## 2017-06-02 MED ORDER — SODIUM CHLORIDE 0.9 % IV SOLN
70.0000 mg/m2 | Freq: Once | INTRAVENOUS | Status: AC
  Administered 2017-06-02: 126 mg via INTRAVENOUS
  Filled 2017-06-02: qty 21

## 2017-06-02 NOTE — Telephone Encounter (Signed)
Has she been wheezing? She has a prior h/o tobacco use so this may be a pulmonary issue. Has she seen her PCP yet? She needs an office evaluation.

## 2017-06-02 NOTE — Patient Instructions (Signed)
[facility] Discharge Instructions for Patients Receiving Chemotherapy  Today you received the following chemotherapy agents: Herceptin & Taxol.   To help prevent nausea and vomiting after your treatment, we encourage you to take your nausea medication as directed.    If you develop nausea and vomiting that is not controlled by your nausea medication, call the clinic.   BELOW ARE SYMPTOMS THAT SHOULD BE REPORTED IMMEDIATELY:  *FEVER GREATER THAN 100.5 F  *CHILLS WITH OR WITHOUT FEVER  NAUSEA AND VOMITING THAT IS NOT CONTROLLED WITH YOUR NAUSEA MEDICATION  *UNUSUAL SHORTNESS OF BREATH  *UNUSUAL BRUISING OR BLEEDING  TENDERNESS IN MOUTH AND THROAT WITH OR WITHOUT PRESENCE OF ULCERS  *URINARY PROBLEMS  *BOWEL PROBLEMS  UNUSUAL RASH Items with * indicate a potential emergency and should be followed up as soon as possible.  Feel free to call the clinic should you have any questions or concerns. The clinic phone number is [phone].  Please show the CHEMO ALERT CARD at check-in to the Emergency Department and triage nurse.   

## 2017-06-02 NOTE — Telephone Encounter (Signed)
No appt until 5/20 with extenders - pt denies any wheezing, hasn't seen pcp yet - pt has appt with oncologist today and wants to discuss with them and call us back. Pt has f/u already scheduled for 6/7 with Dr Bronson Ing

## 2017-06-04 ENCOUNTER — Ambulatory Visit: Payer: Medicare Other | Attending: General Surgery | Admitting: Physical Therapy

## 2017-06-04 DIAGNOSIS — R293 Abnormal posture: Secondary | ICD-10-CM | POA: Diagnosis not present

## 2017-06-04 DIAGNOSIS — Z483 Aftercare following surgery for neoplasm: Secondary | ICD-10-CM | POA: Insufficient documentation

## 2017-06-04 DIAGNOSIS — M25612 Stiffness of left shoulder, not elsewhere classified: Secondary | ICD-10-CM | POA: Diagnosis not present

## 2017-06-04 DIAGNOSIS — R2681 Unsteadiness on feet: Secondary | ICD-10-CM | POA: Diagnosis not present

## 2017-06-04 NOTE — Therapy (Signed)
[place], [place], [address] Phone: [phone]   Fax:  [phone]  Physical Therapy Treatment  Patient Details  Name: Penny Huynh MRN: [id number] Date of Birth: 01-23-1934 Referring Provider: [name]   Encounter Date: 06/04/2017  PT End of Session - 06/04/17 1242    Visit Number  15    Number of Visits  19    Date for PT Re-Evaluation  06/18/17    PT Start Time  0934    PT Stop Time  1020    PT Time Calculation (min)  46 min    Activity Tolerance  Patient tolerated treatment well    Behavior During Therapy  [facility] for tasks assessed/performed       Past Medical History:  Diagnosis Date  . Arthritis    "maybe a little in my left knee" (02/23/2017)  . Asthmatic bronchitis   . Cancer of left breast ([place])   . Coronary atherosclerosis    Minor nonobstructive CAD at cardiac catheterization 2009  . Dyslipidemia   . Femoral bruit 7/08   With normal ABIs  . Hypertension   . Lower extremity edema    Chronic  . Pneumonia ~ 1950   in 8th grade  . Type II diabetes mellitus ([place])     Past Surgical History:  Procedure Laterality Date  . APPENDECTOMY    . BREAST BIOPSY Left 02/2017  . CARDIAC CATHETERIZATION     "long time ago" (02/23/2017)  . MASTECTOMY COMPLETE / SIMPLE W/ SENTINEL NODE BIOPSY Left 02/23/2017   TOTAL MATECTOMY;  LEFT AXILLARY SENTINEL LYMPH NODE BIOPSY ERAS PATHWAY  . MASTECTOMY WITH RADIOACTIVE SEED GUIDED EXCISION AND AXILLARY SENTINEL LYMPH NODE BIOPSY Left 02/23/2017   Procedure: LEFT TOTAL MATECTOMY;  LEFT AXILLARY SENTINEL LYMPH NODE BIOPSY ERAS PATHWAY;  Surgeon: Excell Seltzer, MD;  Location: [place];  Service: General;  Laterality: Left;  . PILONIDAL CYST EXCISION    . PORTA CATH INSERTION  02/23/2017  . PORTACATH PLACEMENT N/A 02/23/2017   Procedure: INSERTION PORT-A-CATH;  Surgeon: Excell Seltzer, MD;  Location: [place];  Service: General;   Laterality: N/A;  . SHOULDER ARTHROSCOPY W/ ROTATOR CUFF REPAIR Right     There were no vitals filed for this visit.  Subjective Assessment - 06/04/17 0935    Subjective  Called her cardiologist who referred her to her primary care doctor; she didn't call him.  Says she thinks the shortness of breath is a little better. [name] aspirated about 32 ccs of fluid off yesterday and she will see him again next week. Balance is not any better today.    Pertinent History  Patient underwent a left mastectomy and sentinel node biopsy (6 negative nodes) on 02/23/17. She will begin chemotherapy 03/31/17 as long as drain is removed today. She has 2 different breast cancers. Both are ER/PR positive but one is HER2 negative and one is HER2 positive. History of right shoulder scope 20 years ago and has diabetes and hypertension, both of which are well controlled.    Currently in Pain?  No/denies         [facility] PT Assessment - 06/04/17 0001      Observation/Other Assessments   Quick DASH   9.09 10 of 11 questions answered        LYMPHEDEMA/ONCOLOGY QUESTIONNAIRE - 06/04/17 0937      Left Upper Extremity Lymphedema   10 cm Proximal to Olecranon Process  28.6 cm  Olecranon Process  23.4 cm    10 cm Proximal to Ulnar Styloid Process  21 cm    Just Proximal to Ulnar Styloid Process  16.4 cm    Across Hand at [employer]  20 cm    At [place] of 2nd Digit  6.6 cm        Quick Dash - 06/04/17 0001    Open a tight or new jar  Mild difficulty    Do heavy household chores (wash walls, wash floors)  Moderate difficulty    Carry a shopping bag or briefcase  No difficulty    Wash your back  No difficulty    Use a knife to cut food  No difficulty    During the past week, to what extent has your arm, shoulder or hand problem interfered with your normal social activities with family, friends, neighbors, or groups?  Not at all    During the past week, to what extent has your arm, shoulder or hand  problem limited your work or other regular daily activities  Not at all    Arm, shoulder, or hand pain.  Mild    Tingling (pins and needles) in your arm, shoulder, or hand  Mild    Difficulty Sleeping  No difficulty    DASH Score  9.09 %             [facility] Adult PT Treatment/Exercise - 06/04/17 0001      Manual Therapy   Soft tissue mobilization  at and near left mastectomy scar, especially lateral 2/3, in all directions, especially working superior    Myofascial Release  crosshands across left mastectomy in vertical; crosshands across left axilla from upper arm to upper abdomen    Other Manual Therapy  in supine to left shoulder er, abduction and flexion                  PT Long Term Goals - 06/04/17 1241      PT LONG TERM GOAL #4   Title  Patient will score </= 10 on the DASH indicating increased shoulder function.    Baseline  54.55, 04/19/17- 18.18; 9.09 on 06/04/17 with patient answering 10 of 11 questions    Status  Achieved      Breast Clinic Goals - 02/08/17 2024      Patient will be able to verbalize understanding of pertinent lymphedema risk reduction practices relevant to her diagnosis specifically related to skin care.   Status  Achieved      Patient will be able to return demonstrate and/or verbalize understanding of the post-op home exercise program related to regaining shoulder range of motion.   Status  Achieved      Patient will be able to verbalize understanding of the importance of attending the postoperative After Breast Cancer Class for further lymphedema risk reduction education and therapeutic exercise.   Status  Achieved           Plan - 06/04/17 1243    Clinical Impression Statement  Pt. still with significant tightness at left shoulder. Manual work to area at left mastectomy incision felt like it achieved loosing of adhesions with those tissues today. She met her quick DASH goal today.    Rehab Potential  Excellent    PT Frequency  2x  / week    PT Duration  4 weeks    PT Treatment/Interventions  ADLs/Self Care Home Management;Therapeutic exercise;Patient/family education;Orthotic Fit/Training;Manual techniques;Passive range of motion;Manual lymph drainage;Scar mobilization;DME  Instruction    PT Next Visit Plan  Add goals. Check balance with Berg and/or TUG. Continue manual work on left chest and axilla soft tissue tightness and immobility.    PT Home Exercise Plan  focus on stretches to try to release cording; supine scapular series  Pt. returned to her previous exercise program at her church as of 05/17/17.    Consulted and Agree with Plan of Care  Patient       Patient will benefit from skilled therapeutic intervention in order to improve the following deficits and impairments:  Postural dysfunction, Decreased knowledge of precautions, Decreased knowledge of use of DME, Impaired UE functional use, Decreased range of motion, Decreased strength, Increased fascial restricitons, Decreased scar mobility  Visit Diagnosis: Aftercare following surgery for neoplasm  Stiffness of left shoulder, not elsewhere classified  Abnormal posture     Problem List Patient Active Problem List   Diagnosis Date Noted  . Port-A-Cath in place 04/14/2017  . Stage II breast cancer, left ([place]) 02/23/2017  . Carcinoma of upper-outer quadrant of left breast in female, estrogen receptor positive ([place]) 02/10/2017  . Carcinoma of upper-inner quadrant of left breast in female, estrogen receptor positive ([place]) 02/10/2017  . Chest pain 07/18/2015  . Hypokalemia 07/18/2015  . Vertigo 07/18/2015  . LBBB (left bundle branch block) 05/28/2009  . AODM 12/21/2007  . Essential hypertension, benign 12/21/2007  . DIASTOLIC DYSFUNCTION 95/18/8416  . FEMORAL BRUIT, RIGHT 12/21/2007  . DYSPNEA 12/21/2007    SALISBURY,DONNA 06/04/2017, 12:45 PM  [place], [place],  [address] Phone: [phone]   Fax:  [phone]  Name: SOMAYA GRASSI MRN: [id number] Date of Birth: 02-03-1934  Serafina Royals, PT 06/04/17 12:45 PM

## 2017-06-07 NOTE — Progress Notes (Signed)
[place]  [phone] [phone]  Clinic Follow up Note   Patient Care Team: Manon Hilding, MD as PCP - General (Cardiology) Truitt Merle, MD as Consulting Physician (Hematology) Excell Seltzer, MD as Consulting Physician (General Surgery) Alla Feeling, NP as Nurse Practitioner (Nurse Practitioner) [name], MD as Attending Physician (Cardiology)   Date of Service:  06/09/2017  SUMMARY OF ONCOLOGIC HISTORY: Cancer Staging Carcinoma of upper-inner quadrant of left breast in female, estrogen receptor positive ([place]) Staging form: Breast, AJCC 8th Edition - Clinical stage from 01/18/2017: Stage IB (cT2, cN0, cM0, G3, ER: Positive, PR: Positive, HER2: Positive) - Signed by Truitt Merle, MD on 02/10/2017 - Pathologic stage from 02/23/2017: Stage IB (pT3, pN0, cM0, G3, ER: Positive, PR: Positive, HER2: Positive) - Signed by Alla Feeling, NP on 03/16/2017  Carcinoma of upper-outer quadrant of left breast in female, estrogen receptor positive ([place]) Staging form: Breast, AJCC 8th Edition - Clinical stage from 01/18/2017: Stage IB (cT2, cN0, cM0, G2, ER: Positive, PR: Positive, HER2: Negative) - Unsigned - Pathologic stage from 02/23/2017: Stage IB (pT2, pN0(sn), cM0, G3, ER: Positive, PR: Positive, HER2: Negative) - Signed by Alla Feeling, NP on 03/16/2017  Oncology History   Cancer Staging Carcinoma of upper-inner quadrant of left breast in female, estrogen receptor positive ([place]) Staging form: Breast, AJCC 8th Edition - Clinical stage from 01/18/2017: Stage IB (cT2, cN0, cM0, G3, ER: Positive, PR: Positive, HER2: Positive) - Signed by Truitt Merle, MD on 02/10/2017 - Pathologic stage from 02/23/2017: Stage IB (pT3, pN0, cM0, G3, ER: Positive, PR: Positive, HER2: Positive) - Signed by Alla Feeling, NP on 03/16/2017  Carcinoma of upper-outer quadrant of left breast in female, estrogen receptor positive ([place]) Staging form: Breast, AJCC 8th  Edition - Clinical stage from 01/18/2017: Stage IB (cT2, cN0, cM0, G2, ER: Positive, PR: Positive, HER2: Negative) - Unsigned - Pathologic stage from 02/23/2017: Stage IB (pT2, pN0(sn), cM0, G3, ER: Positive, PR: Positive, HER2: Negative) - Signed by Alla Feeling, NP on 03/16/2017       Carcinoma of upper-outer quadrant of left breast in female, estrogen receptor positive ([place])   01/13/2017 Mammogram    FINDINGS: In the left breast, a spiculated mass lies in the upper outer quadrant. In the medial posterior left breast, there is a larger lobulated mass. These masses correspond to the palpable abnormalities. No other discrete left breast masses.  In the right breast, there are no discrete masses. There are no areas of architectural distortion.  In both breasts there are scattered calcifications, rounded punctate, with a few other larger coarse dystrophic calcifications, without significant change from the previous screening mammogram, which is dated 12/04/2009.  Mammographic images were processed with CAD.  On physical exam, there is a firm palpable mass in the upper outer left breast and another firm mass in the medial left breast.       01/13/2017 Breast US    Targeted ultrasound is performed, showing an irregular hypoechoic shadowing mass in the left breast at the 2:30 o'clock position, 5 cm the nipple, measuring 3.3 x 2.8 x 2.6 cm. In the 10:30 o'clock position of the left breast, 5 cm the nipple, there is irregular hypoechoic mass with somewhat more lobulated margins, corresponding to the lobulated mass seen mammographically. This measures 4.8 x 2.9 x 4.8 cm. Both masses show internal vascularity on color Doppler analysis.  Sonographic evaluation of the left axilla shows several nodes with thickened cortices. Status cortex  measured is 5 mm. None of these lymph nodes appear enlarged but overall size criteria.  IMPRESSION: 1. Two masses in the left breast, 1 at  the 2:30 o'clock position in the other at the 10:30 o'clock position, both highly suspicious for breast carcinoma. 2. Several abnormal left axillary lymph nodes with thickened cortices. 3. No evidence of right breast malignancy.       01/18/2017 Pathology Results    Diagnosis 1. Breast, left, needle core biopsy, 2:30 o'clock - INVASIVE MAMMARY CARCINOMA - MAMMARY CARCINOMA IN-SITU - SEE COMMENT 2. Lymph node, needle/core biopsy, left axilla - NO CARCINOMA IDENTIFIED - SEE COMMENT 3. Breast, left, needle core biopsy, 10:30 o'clock - INVASIVE DUCTAL CARCINOMA - SEE COMMENT  1. PROGNOSTIC INDICATORS Results: IMMUNOHISTOCHEMICAL AND MORPHOMETRIC ANALYSIS PERFORMED MANUALLY Estrogen Receptor: 100%, POSITIVE, STRONG STAINING INTENSITY Progesterone Receptor: 60%, POSITIVE, STRONG STAINING INTENSITY Proliferation Marker Ki67: 60%  1. FLUORESCENCE IN-SITU HYBRIDIZATION Results: HER2 - NEGATIVE RATIO OF HER2/CEP17 SIGNALS 1.28 AVERAGE HER2 COPY NUMBER PER CELL 2.75  3. PROGNOSTIC INDICATORS Results: IMMUNOHISTOCHEMICAL AND MORPHOMETRIC ANALYSIS PERFORMED MANUALLY Estrogen Receptor: 100%, POSITIVE, STRONG STAINING INTENSITY Progesterone Receptor: 50%, POSITIVE, STRONG STAINING INTENSITY Proliferation Marker Ki67: 70%  3. FLUORESCENCE IN-SITU HYBRIDIZATION Results: HER2 - **POSITIVE** RATIO OF HER2/CEP17 SIGNALS 2.80 AVERAGE HER2 COPY NUMBER PER CELL 6.15  Microscopic Comment 1. The biopsy material shows an infiltrative proliferation of cells with large vesicular nuclei with inconspicuous nucleoli, arranged linearly and in small clusters. Based on the biopsy, the carcinoma appears Nottingham grade 2 of 3 and measures 0.9 cm in greatest linear extent. E-cadherin and prognostic markers (ER/PR/ki-67/HER2-FISH)are pending and will be reported in an addendum. Dr. Lyndon Code reviewed the case and agrees with the above diagnosis.      02/10/2017 Initial Diagnosis    Carcinoma of  upper-outer quadrant of left breast in female, estrogen receptor positive ([place])      02/19/2017 Imaging    Bone Scan whole Body 02/19/17 IMPRESSION: Negative for evidence of bony metastatic disease.      02/19/2017 Imaging    CT CAP W Contrast 02/19/17 IMPRESSION: 1. Small right middle lobe pulmonary nodules up to 0.8 cm, nonspecific. Non-contrast chest CT at 3-6 months is recommended. If the nodules are stable at time of repeat CT, then future CT at 18-24 months (from today's scan) is considered optional for low-risk patients, but is recommended for high-risk patients. This recommendation follows the consensus statement: Guidelines for Management of Incidental Pulmonary Nodules Detected on CT Images: From the Fleischner Society 2017; Radiology 2017; 284:228-243. 2. Subcentimeter hepatic lesions likely cysts but incompletely characterized due to small size. Attention on follow-up imaging recommended. 3. 2.1 cm fusiform right common iliac artery aneurysm. Continued surveillance recommended. 4. Aortic Atherosclerosis (ICD10-I70.0) and Emphysema (ICD10-J43.9).      02/23/2017 Pathology Results    Diagnosis 1. Breast, simple mastectomy, Left Total - INVASIVE DUCTAL CARCINOMA, MULTIFOCAL, NOTTINGHAM GRADE 3/3 (5.3 CM, 3.5 CM) - INVASIVE CARCINOMA INVOLVES THE DERMIS - DUCTAL CARCINOMA IN SITU, INTERMEDIATE GRADE - HYALINIZED FIBROADENOMA - MARGINS UNINVOLVED BY CARCINOMA - NO CARCINOMA IDENTIFIED IN TWO LYMPH NODES (0/2) - SEE ONCOLOGY TABLE AND COMMENT BELOW 2. Lymph node, sentinel, biopsy, Left Axillary - NO CARCINOMA IDENTIFIED IN ONE LYMPH NODE (0/1) 3. Lymph node, sentinel, biopsy - NO CARCINOMA IDENTIFIED IN ONE LYMPH NODE (0/1) 4. Lymph node, sentinel, biopsy - NO CARCINOMA IDENTIFIED IN ONE LYMPH NODE (0/1) 5. Lymph node, sentinel, biopsy - NO CARCINOMA IDENTIFIED IN ONE LYMPH NODE (0/1) Microscopic Comment 1. BREAST, INVASIVE  TUMOR Procedure: Simple  mastectomy Laterality: Left Tumor Size: 5. 3 cm, 3.5 cm Histologic Type: Invasive carcinoma of no special type (ductal, not otherwise specified) Grade: Nottingham Grade 3 Tubular Differentiation: 3 Nuclear Pleomorphism: 3 Mitotic Count: 2 Ductal Carcinoma in Situ (DCIS): Present Extent of Tumor: Skin: Invasive carcinoma directly invades into the dermis or epidermis without skin ulceration Margins: Invasive carcinoma, distance from closest margin: 1.5 cm (posterior) DCIS, distance from closest margin: > 1 cm Regional Lymph Nodes: Number of Lymph Nodes Examined: 6 Number of Sentinel Lymph Nodes Examined: 4 Lymph Nodes with Macrometastases: 0 Lymph Nodes with Micrometastases: 0 Lymph Nodes with Isolated Tumor Cells: 0 Treatment effect: No known presurgical therapy Breast Prognostic Profile: See Also ([id number]) Estrogen Receptor: Positive (100%, strong); Positive (100%, strong) Progesterone Receptor: Positive (50%, strong); Positive (50%, strong) Her2: Positive (Ratio 2.80); Negative (Ratio 1.28) Ki-67: 70%; 60% Best tumor block for sendout testing: 1E Pathologic Stage Classification (pTNM, AJCC 8th Edition): Primary Tumor: mpT3 Regional Lymph Nodes: pN0 COMMENT: The two invasive carcinomas in the breast have slightly different morphologies. The larger lesion has a papillary and micropapillary pattern admixed with typical ductal carcinoma while the smaller lesion is more typical of a ductal lesion with linear arrays (E-cadherin performed on biopsy). There are foci within the larger lesion that are concerning for lymphovascular space invasion.       04/07/2017 -  Chemotherapy    weekly taxol and herceptin x12 weeks then herceptin q3 weeks for total 1 year      HISTORY OF PRESENTING ILLNESS:  Penny Huynh 84 y.o. female is here because of the diagnosed left breast cancer, she presents with her husband.  The patient self palpated a left breast mass around 6 months ago, she  thought it was calcium deposit as her mother had a history of this.  There was no pain at first but has become intermittently tender which prompted her to seek medical attention with her PCP.  She reports the palpable mass nearly doubled in that time period. No associated breast swelling or nipple discharge.  She reports a decreased appetite lately, denies fatigue or weight loss.  Diagnostic imaging shows an irregular hypoechoic mass in the left breast at the 2:30 o'clock position, 5 cm from the nipple, measuring 3.3 x 2.8 x 2.6 cm.  In the 10:30 o'clock position of the left breast, 5 cm from the nipple, there is irregular hypoechoic mass measuring 4.8 x 2.9 x 4.8 cm.  Ultrasound of the left axilla shows cortical thickening but no enlarged lymph nodes.  Left breast mass at 2:30 o'clock position is positive for invasive mammary carcinoma with mammary carcinoma in situ, ER/PR positive, HER-2 negative; left breast mass at 10:30 o'clock position is positive for invasive ductal carcinoma, ER/PR positive, HER-2 positive.  The biopsied lymph node was negative for carcinoma.  She was then referred to [name] for surgical consult.  Today she reports feeling well with intermittent productive nagging cough with clear phlegm; no wheezing or dyspnea.  Has occasional burning in hands and feet but not affecting function, she attributes to long-term diabetes.  Denies breast or general pain, as well.  Denies palpitations, chest pain, or lower extremity edema.  In the past she has history of knee pain, hypertension, hyperlipidemia, CAD, and DM.  In the 1960s she was told she had an enlarged heart but was never told she had a diagnosis of heart failure, no MI or stroke.  She is followed by cardiologist Dr. Bronson Ing. Last  echo in 2013 with EF 60-65% and grade I diastolic dysfunction.   She is retired from [employer] but has been recently enrolled in college courses to further her education. She  previously exercised 6 days per week but decreased exercise over the last 6 months. She also works at [employer] during tax season. Lives at home with her spouse. She is independent of all ADLs, drives. She drinks alcohol occasionally; former smoker who quit in 2001, previously smoked 2 PPD x46 years.   GYN HISTORY  Menarchal: age 7-10 LMP: age 85 Contraceptive: none HRT: none GP: G2P0, has 2 grown (foster) children  CURRENT THERAPY:  weekly taxol and Herceptin x12 weeks beginning 04/07/17 then maintenance Herceptin for total 1 year   INTERVAL HISTORY:  Penny Huynh returns for follow up and weekly Taxol and Herceptin. She presents to the clinic today accompanied by her husband. She notes she is okay overall. She has complaints of pain in her feet from numbness and swelling. She notes her pain to be a 6-7/10. She has no trouble walking. Her neuropathy is mild in her fingers and she will continue to use ice on her hands and she will start on her feet. She endorses a good appetite.   On review of systems, pt denies diarrhea, or any other complaints at this time. Pertinent positives are listed and detailed within the above HPI.   REVIEW OF SYSTEMS:   Constitutional: Denies fatigue, fevers, chills or abnormal weight loss (+) good appetite Eyes: Denies blurriness of vision Ears, nose, mouth, throat, and face: Denies mucositis or sore throat (+) voice hoarseness Respiratory: Denies cough, dyspnea or wheezes Cardiovascular: Denies palpitation, chest discomfort or lower extremity swelling Gastrointestinal:  Denies nausea, vomiting, diarrhea, heartburn  Skin: Denies rashes Lymphatics: Denies new lymphadenopathy or easy bruising Neurological: Denies new weaknesses  (+) peripheral neuropathy in her hands and feet Behavioral/Psych: Mood is stable, no new changes  Breast: (+) s/p left mastectomy  All other systems were reviewed with the patient and are negative.  MEDICAL HISTORY:  Past Medical  History:  Diagnosis Date  . Arthritis    "maybe a little in my left knee" (02/23/2017)  . Asthmatic bronchitis   . Cancer of left breast ([place])   . Coronary atherosclerosis    Minor nonobstructive CAD at cardiac catheterization 2009  . Dyslipidemia   . Femoral bruit 7/08   With normal ABIs  . Hypertension   . Lower extremity edema    Chronic  . Pneumonia ~ 1950   in 8th grade  . Type II diabetes mellitus ([place])     SURGICAL HISTORY: Past Surgical History:  Procedure Laterality Date  . APPENDECTOMY    . BREAST BIOPSY Left 02/2017  . CARDIAC CATHETERIZATION     "long time ago" (02/23/2017)  . MASTECTOMY COMPLETE / SIMPLE W/ SENTINEL NODE BIOPSY Left 02/23/2017   TOTAL MATECTOMY;  LEFT AXILLARY SENTINEL LYMPH NODE BIOPSY ERAS PATHWAY  . MASTECTOMY WITH RADIOACTIVE SEED GUIDED EXCISION AND AXILLARY SENTINEL LYMPH NODE BIOPSY Left 02/23/2017   Procedure: LEFT TOTAL MATECTOMY;  LEFT AXILLARY SENTINEL LYMPH NODE BIOPSY ERAS PATHWAY;  Surgeon: Excell Seltzer, MD;  Location: [facility];  Service: General;  Laterality: Left;  . PILONIDAL CYST EXCISION    . PORTA CATH INSERTION  02/23/2017  . PORTACATH PLACEMENT N/A 02/23/2017   Procedure: INSERTION PORT-A-CATH;  Surgeon: Excell Seltzer, MD;  Location: [facility];  Service: General;  Laterality: N/A;  . SHOULDER ARTHROSCOPY W/ ROTATOR CUFF REPAIR  Right     I have reviewed the social history and family history with the patient and they are unchanged from previous note.  ALLERGIES:  has No Known Allergies.  MEDICATIONS:  Current Outpatient Medications  Medication Sig Dispense Refill  . aspirin 81 MG EC tablet Take 81 mg by mouth at bedtime.     . chlorthalidone (HYGROTON) 25 MG tablet Take 25 mg by mouth daily.     . Coenzyme Q10 (COQ10) 100 MG CAPS Take 100 mg by mouth daily.      Marland Kitchen latanoprost (XALATAN) 0.005 % ophthalmic solution Place 1 drop into both eyes at bedtime.    . lidocaine-prilocaine (EMLA) cream Apply to affected area once  30 g 3  . losartan (COZAAR) 50 MG tablet Take 50 mg by mouth daily.    . metFORMIN (GLUCOPHAGE) 500 MG tablet Take 1,000 mg by mouth daily with breakfast.     . ondansetron (ZOFRAN) 8 MG tablet Take 1 tablet (8 mg total) by mouth 2 (two) times daily as needed (Nausea or vomiting). 30 tablet 1  . potassium chloride SA (K-DUR,KLOR-CON) 20 MEQ tablet Take 1 tablet (20 mEq total) by mouth daily. 30 tablet 0  . prochlorperazine (COMPAZINE) 10 MG tablet Take 1 tablet (10 mg total) by mouth every 6 (six) hours as needed (Nausea or vomiting). 30 tablet 1  . traMADol (ULTRAM) 50 MG tablet Take 1 tablet (50 mg total) by mouth every 6 (six) hours as needed (mild pain). 15 tablet 0  . vitamin B-12 (CYANOCOBALAMIN) 1000 MCG tablet Take 1,000 mcg by mouth daily.     No current facility-administered medications for this visit.     PHYSICAL EXAMINATION: ECOG PERFORMANCE STATUS: 1 - Symptomatic but completely ambulatory  Vitals:   06/09/17 0946  BP: (!) 179/83  Pulse: 65  Resp: 18  Temp: 98.5 F (36.9 C)  SpO2: 100%   Filed Weights   06/09/17 0946  Weight: 155 lb 9.6 oz (70.6 kg)    GENERAL:alert, no distress and comfortable SKIN: skin color, texture, turgor are normal   EYES: normal, Conjunctiva are pink and non-injected, sclera clear OROPHARYNX:no exudate, no erythema and lips, buccal mucosa, and tongue normal  NECK: supple, thyroid normal size, non-tender, without nodularity LYMPH:  no palpable cervical, supraclavicular, or axillary lymphadenopathy  LUNGS: clear to auscultation bilaterally with normal breathing effort HEART: regular rate & rhythm and no murmurs and no lower extremity edema ABDOMEN:abdomen soft, non-tender and normal bowel sounds Musculoskeletal:no cyanosis of digits and no clubbing  NEURO: alert & oriented x 3 with fluent speech, no focal motor/sensory deficits BREASTS: s/p left mastectomy, surgical incision healing well PAC without erythema   LABORATORY DATA:  I have  reviewed the data as listed CBC Latest Ref Rng & Units 06/09/2017 06/02/2017 05/26/2017  WBC 3.9 - 10.3 K/uL 3.1(L) 3.6(L) 2.2(L)  Hemoglobin 11.6 - 15.9 g/dL 9.4(L) 9.3(L) 9.9(L)  Hematocrit 34.8 - 46.6 % 28.1(L) 28.0(L) 29.1(L)  Platelets 145 - 400 K/uL 341 319 245     CMP Latest Ref Rng & Units 06/09/2017 06/02/2017 05/26/2017  Glucose 70 - 140 mg/dL 97 116 135  BUN 7 - 26 mg/dL _0 Creatinine 0.60 - 1.10 mg/dL 0.71 0.76 0.69  Sodium 136 - 145 mmol/L 129(L) 127(L) 127(L)  Potassium 3.5 - 5.1 mmol/L 3.7 3.5 3.2(L)  Chloride 98 - 109 mmol/L 97(L) 94(L) 93(L)  CO2 22 - 29 mmol/L _1 Calcium 8.4 - 10.4 mg/dL 9.2 9.3 9.2  Total Protein 6.4 - 8.3 g/dL 6.2(L) 6.1(L) 6.3(L)  Total Bilirubin 0.2 - 1.2 mg/dL 0.3 0.3 0.6  Alkaline Phos 40 - 150 U/L 52 52 53  AST 5 - 34 U/L _0 ALT 0 - 55 U/L _1 ANC 1.4K today   PATHOLOGY  LEFT SIMPLE MASTECTOMY WITH SNLB 02/23/17 per [name] Diagnosis 1. Breast, simple mastectomy, Left Total - INVASIVE DUCTAL CARCINOMA, MULTIFOCAL, NOTTINGHAM GRADE 3/3 (5.3 CM, 3.5 CM) - INVASIVE CARCINOMA INVOLVES THE DERMIS - DUCTAL CARCINOMA IN SITU, INTERMEDIATE GRADE - HYALINIZED FIBROADENOMA - MARGINS UNINVOLVED BY CARCINOMA - NO CARCINOMA IDENTIFIED IN TWO LYMPH NODES (0/2) - SEE ONCOLOGY TABLE AND COMMENT BELOW 2. Lymph node, sentinel, biopsy, Left Axillary - NO CARCINOMA IDENTIFIED IN ONE LYMPH NODE (0/1) 3. Lymph node, sentinel, biopsy - NO CARCINOMA IDENTIFIED IN ONE LYMPH NODE (0/1) 4. Lymph node, sentinel, biopsy - NO CARCINOMA IDENTIFIED IN ONE LYMPH NODE (0/1) 5. Lymph node, sentinel, biopsy - NO CARCINOMA IDENTIFIED IN ONE LYMPH NODE (0/1) Microscopic Comment 1. BREAST, INVASIVE TUMOR Procedure: Simple mastectomy Laterality: Left Tumor Size: 5. 3 cm, 3.5 cm Histologic Type: Invasive carcinoma of no special type (ductal, not otherwise specified) Grade: Nottingham Grade 3 Tubular Differentiation: 3 Nuclear Pleomorphism:  3 Mitotic Count: 2 Ductal Carcinoma in Situ (DCIS): Present Extent of Tumor: Skin: Invasive carcinoma directly invades into the dermis or epidermis without skin ulceration Margins: Invasive carcinoma, distance from closest margin: 1.5 cm (posterior) DCIS, distance from closest margin: > 1 cm Regional Lymph Nodes: Number of Lymph Nodes Examined: 6 Number of Sentinel Lymph Nodes Examined: 4 Lymph Nodes with Macrometastases: 0 Lymph Nodes with Micrometastases: 0 Lymph Nodes with Isolated Tumor Cells: 0 Treatment effect: No known presurgical therapy Breast Prognostic Profile: See Also ([id number]) Estrogen Receptor: Positive (100%, strong); Positive (100%, strong) Progesterone Receptor: Positive (50%, strong); Positive (50%, strong) Her2: Positive (Ratio 2.80); Negative (Ratio 1.28) Ki-67: 70%; 60% Best tumor block for sendout testing: 1E Pathologic Stage Classification (pTNM, AJCC 8th Edition): Primary Tumor: mpT3 Regional Lymph Nodes: pN0 COMMENT: The two invasive carcinomas in the breast have slightly different morphologies. The larger lesion has a papillary and micropapillary pattern admixed with typical ductal carcinoma while the smaller lesion is more typical of a ductal lesion with linear arrays (E-cadherin performed on biopsy). There are foci within the larger lesion that are concerning for lymphovascular space invasion.   02/17/17 ECHO: Study Conclusions - Left ventricle: The cavity size was normal. Wall thickness was   increased in a pattern of mild LVH. Systolic function was normal.   The estimated ejection fraction was in the range of 55% to 60%.   Normal GLS at -20%. Wall motion was normal; there were no   regional wall motion abnormalities. Doppler parameters are   consistent with abnormal left ventricular relaxation (grade 1   diastolic dysfunction). The E/e&' ratio is between 8-15,   suggesting indeterminate LV filling pressure. - Aortic valve: Sclerosis without  stenosis. - Aorta: Mildly dilated aorta. Aortic root dimension: 39 mm (ED).   Ascending aortic diameter: 38 mm (S). - Left atrium: The atrium was normal in size. - Inferior vena cava: The vessel was normal in size. The   respirophasic diameter changes were in the normal range (>= 50%),   consistent with normal central venous pressure. Impressions: - Compared to a prior study in 2013, the LVEF is slightly lower but   normal at 55-60%. There is mild LVH, normal GLS at -20%,  grade 1   DD and indeterminate LV filling pressure. The aorta is mildly   dilated at 3.8-3.9 cm.   RADIOGRAPHIC STUDIES: I have personally reviewed the radiological images as listed and agreed with the findings in the report. No results found.    Bone Scan whole Body 02/19/17 IMPRESSION: Negative for evidence of bony metastatic disease.   CT CAP W Contrast 02/19/17 IMPRESSION: 1. Small right middle lobe pulmonary nodules up to 0.8 cm, nonspecific. Non-contrast chest CT at 3-6 months is recommended. If the nodules are stable at time of repeat CT, then future CT at 18-24 months (from today's scan) is considered optional for low-risk patients, but is recommended for high-risk patients. This recommendation follows the consensus statement: Guidelines for Management of Incidental Pulmonary Nodules Detected on CT Images: From the Fleischner Society 2017; Radiology 2017; 284:228-243. 2. Subcentimeter hepatic lesions likely cysts but incompletely characterized due to small size. Attention on follow-up imaging recommended. 3. 2.1 cm fusiform right common iliac artery aneurysm. Continued surveillance recommended. 4. Aortic Atherosclerosis (ICD10-I70.0) and Emphysema (ICD10-J43.9).   ASSESSMENT & PLAN: Penny Huynh is an 84 year old with past medical history positive for HTN, DM, CAD now with newly diagnosed left breast cancer  1.  Multifocal invasive ductal carcinoma of the left breast of female; carcinoma of the  upper-outer quadrant of left breast and female, ER/PR positive, HER-2 negative pT2N0M0, G2, Stage 1B; IDC of the upper-inner quadrant left breast, ER+/PR+/HER2+, pT3N0M0 G3, stage IB -Due to her HER2 positive disease, obtained a CT CAP and bone scan to complete staging work up on 02/19/17 which was negative for distant  metastasis.   -We previously reviewed her pathology results in detail, she had multifocal invasive ductal carcinoma, G3, the upper inner quadrant focus spanning 5.3 cm, ER+/PR+/HER2+; the upper outer quadrant focus spanned 3.5 cm, ER+/PR+/HER2- node-negative.  -We previously discussed the high risk of recurrence due to HER-2 positive T3 disease, I recommended her to consider adjuvant chemotherapy.  Due to her advanced age, will recommend weekly Taxol and Herceptin, followed by Herceptin maintenance therapy for a total of 1 year. She began this regimen on 04/07/17. She has been tolerating treatment well overall  -she had echo 02/17/17 which shows normal EF 55-60% with mild LVH and grade 1 DD; I sent message to her cardiologist to follow while on herceptin; will repeat echo q3 months while on therapy. Will repeat ECHO in 07/2017. She is scheduled for 07/09/17 -Labs reviewed, her blood counts are low, they are adequate to proceed with reduced Taxol 60 mg/m2 and Herceptin today due to worsening neuropathy. She will use ice on her hands and feet to reduce neuropathy. -F/u in 2 weeks   2. HTN, CAD, DM -continue chlorthalidone and losartan for BP, metformin for DM -We reviewed DM can worsen while on chemotherapy especially with steroid administration; will monitor closely.  -I recommended she eat well and be active within limitations of post-op recovery.   3. Skin rash  -She had red raised pruritic rash confined to her upper chest previously, appears to be allergic reaction. I recommended she apply topical steroid cream such as hydrocortisone PRN and continue benadryl spray for itching.   -resolved  4.  Hyponatremia -Na 126 on 04/14/17, does usually run low in low 130's.  Not on SSRI.  On chlorthalidone diuretic for HTN. Lacie recommended she hold this medication and increase salt intake.  She will call PCP to see if adjustment needs to be made in losartan while off diuretic. Encouraged to  monitor her BP. She is asymptomatic; will monitor closely.  -Lacie held chlorthalidone on 05/26/17  5. Peripheral neuropathy, G1, secondary to taxol and DM -She has progressive peripheral neuropathy to her feet, increasing in a "stocking" distribution, related to DM and worsened on chemotherapy. -She will use cryotherapy to her feet during taxol infusion, and it was recommended she take B complex vitamin for nerve health. -Taxol dose reduced on 05/26/17.  -Her neuropathy is worse on her feet than her hands. I advised her to ice her hands and start icing her feet during chemo. -Taxol dose further reduced with Cycle 9 on 06/09/17   PLAN: -Labs reviewed,, they are adequate to proceed treatment - I reduced Taxol to 60 mg/m2 due to worsening neuropathy. She will use ice on her hands and start icing her feet during infusion. -F/u in 2 weeks with NP Lacie    All questions were answered. The patient knows to call the clinic with any problems, questions or concerns. No barriers to learning was detected.  I spent  20 minutes counseling the patient face to face. The total time spent in the appointment was 25 minutes and more than 50% was on counseling and review of test results  This document serves as a record of services personally performed by Truitt Merle, MD. It was created on her behalf by Theresia Bough, a trained medical scribe. The creation of this record is based on the scribe's personal observations and the provider's statements to them.   I have reviewed the above documentation for accuracy and completeness, and I agree with the above.    Truitt Merle, MD 06/09/2017

## 2017-06-08 ENCOUNTER — Ambulatory Visit: Payer: Medicare Other | Admitting: Physical Therapy

## 2017-06-08 DIAGNOSIS — M25612 Stiffness of left shoulder, not elsewhere classified: Secondary | ICD-10-CM

## 2017-06-08 DIAGNOSIS — R2681 Unsteadiness on feet: Secondary | ICD-10-CM | POA: Diagnosis not present

## 2017-06-08 DIAGNOSIS — Z483 Aftercare following surgery for neoplasm: Secondary | ICD-10-CM

## 2017-06-08 DIAGNOSIS — R293 Abnormal posture: Secondary | ICD-10-CM

## 2017-06-08 NOTE — Therapy (Signed)
[place], [place], [address] Phone: [phone]   Fax:  [phone]  Physical Therapy Treatment  Patient Details  Name: Penny Huynh MRN: [id number] Date of Birth: 1933-09-04 Referring Provider: [name]   Encounter Date: 06/08/2017  PT End of Session - 06/08/17 1723    Visit Number  16    Number of Visits  19    Date for PT Re-Evaluation  06/18/17    PT Start Time  1301    PT Stop Time  1346    PT Time Calculation (min)  45 min    Activity Tolerance  Patient tolerated treatment well    Behavior During Therapy  [facility] for tasks assessed/performed       Past Medical History:  Diagnosis Date  . Arthritis    "maybe a little in my left knee" (02/23/2017)  . Asthmatic bronchitis   . Cancer of left breast ([place])   . Coronary atherosclerosis    Minor nonobstructive CAD at cardiac catheterization 2009  . Dyslipidemia   . Femoral bruit 7/08   With normal ABIs  . Hypertension   . Lower extremity edema    Chronic  . Pneumonia ~ 1950   in 8th grade  . Type II diabetes mellitus ([place])     Past Surgical History:  Procedure Laterality Date  . APPENDECTOMY    . BREAST BIOPSY Left 02/2017  . CARDIAC CATHETERIZATION     "long time ago" (02/23/2017)  . MASTECTOMY COMPLETE / SIMPLE W/ SENTINEL NODE BIOPSY Left 02/23/2017   TOTAL MATECTOMY;  LEFT AXILLARY SENTINEL LYMPH NODE BIOPSY ERAS PATHWAY  . MASTECTOMY WITH RADIOACTIVE SEED GUIDED EXCISION AND AXILLARY SENTINEL LYMPH NODE BIOPSY Left 02/23/2017   Procedure: LEFT TOTAL MATECTOMY;  LEFT AXILLARY SENTINEL LYMPH NODE BIOPSY ERAS PATHWAY;  Surgeon: Excell Seltzer, MD;  Location: [place];  Service: General;  Laterality: Left;  . PILONIDAL CYST EXCISION    . PORTA CATH INSERTION  02/23/2017  . PORTACATH PLACEMENT N/A 02/23/2017   Procedure: INSERTION PORT-A-CATH;  Surgeon: Excell Seltzer, MD;  Location: [place];  Service: General;   Laterality: N/A;  . SHOULDER ARTHROSCOPY W/ ROTATOR CUFF REPAIR Right     There were no vitals filed for this visit.  Subjective Assessment - 06/08/17 1304    Subjective  I think my balance is a little better. "I walked a half mile the other day." Did fine after last visit--not sore.  She says she knows that there is fluid to be drawn off again when she sees the surgeon on Thursday.    Pertinent History  Patient underwent a left mastectomy and sentinel node biopsy (6 negative nodes) on 02/23/17. She will begin chemotherapy 03/31/17 as long as drain is removed today. She has 2 different breast cancers. Both are ER/PR positive but one is HER2 negative and one is HER2 positive. History of right shoulder scope 20 years ago and has diabetes and hypertension, both of which are well controlled.    Patient Stated Goals  See if my arm is ok    Currently in Pain?  No/denies         [facility] PT Assessment - 06/08/17 0001      Berg Balance Test   Sit to Stand  Able to stand without using hands and stabilize independently    Standing Unsupported  Able to stand safely 2 minutes    Sitting with Back Unsupported but Feet Supported on Floor or Stool  Able to sit safely and securely 2 minutes    Stand to Sit  Sits safely with minimal use of hands    Transfers  Able to transfer safely, minor use of hands    Standing Unsupported with Eyes Closed  Able to stand 10 seconds safely    Standing Ubsupported with Feet Together  Able to place feet together independently and stand 1 minute safely    From Standing, Reach Forward with Outstretched Arm  Can reach confidently >25 cm (10")    From Standing Position, Pick up Object from Floor  Able to pick up shoe safely and easily    From Standing Position, Turn to Look Behind Over each Shoulder  Looks behind from both sides and weight shifts well    Turn 360 Degrees  Able to turn 360 degrees safely in 4 seconds or less    Standing Unsupported, Alternately Place Feet on  Step/Stool  Able to stand independently and safely and complete 8 steps in 20 seconds    Standing Unsupported, One Foot in Front  Able to place foot tandem independently and hold 30 seconds    Standing on One Leg  Able to lift leg independently and hold 5-10 seconds    Total Score  55      Timed Up and Go Test   Normal TUG (seconds)  12                   [facility] Adult PT Treatment/Exercise - 06/08/17 0001      Manual Therapy   Soft tissue mobilization  at and near left mastectomy scar, especially lateral 2/3, in all directions, especially working superior    Myofascial Release  crosshands across left mastectomy in vertical and diagonal; unidirectional pull at left lateral pect area; crosshands across anterior left axilla    Passive ROM  in supine to left shoulder for er, flexion, and abduction    Other Manual Therapy  --                  PT Long Term Goals - 06/08/17 1725      Additional Long Term Goals   Additional Long Term Goals  Yes      PT LONG TERM GOAL #6   Title  Left shoulder abduction to at least 160 degrees    Time  2    Period  [facility] Goals - 02/08/17 2024      Patient will be able to verbalize understanding of pertinent lymphedema risk reduction practices relevant to her diagnosis specifically related to skin care.   Status  Achieved      Patient will be able to return demonstrate and/or verbalize understanding of the post-op home exercise program related to regaining shoulder range of motion.   Status  Achieved      Patient will be able to verbalize understanding of the importance of attending the postoperative After Breast Cancer Class for further lymphedema risk reduction education and therapeutic exercise.   Status  Achieved           Plan - 06/08/17 1723    Clinical Impression Statement  Pt. reported her balance felt improved today compared to recently.  TUG and Berg balance tests were done and  patient scored 12 seconds and 55/56 respectively on those tests, so she did very well and did not show a need today of work on these areas. She  has ongoing tightness at left chest and shoulder areas.    Rehab Potential  Excellent    PT Frequency  2x / week    PT Duration  4 weeks    PT Treatment/Interventions  ADLs/Self Care Home Management;Therapeutic exercise;Patient/family education;Orthotic Fit/Training;Manual techniques;Passive range of motion;Manual lymph drainage;Scar mobilization;DME Instruction    PT Next Visit Plan  Continue manual work on left chest and axilla soft tissue tightness and immobility.    PT Home Exercise Plan  focus on stretches to try to release cording; supine scapular series  Pt. returned to her previous exercise program at her church as of 05/17/17.    Consulted and Agree with Plan of Care  Patient       Patient will benefit from skilled therapeutic intervention in order to improve the following deficits and impairments:  Postural dysfunction, Decreased knowledge of precautions, Decreased knowledge of use of DME, Impaired UE functional use, Decreased range of motion, Decreased strength, Increased fascial restricitons, Decreased scar mobility  Visit Diagnosis: Aftercare following surgery for neoplasm  Stiffness of left shoulder, not elsewhere classified  Abnormal posture     Problem List Patient Active Problem List   Diagnosis Date Noted  . Port-A-Cath in place 04/14/2017  . Stage II breast cancer, left ([place]) 02/23/2017  . Carcinoma of upper-outer quadrant of left breast in female, estrogen receptor positive ([place]) 02/10/2017  . Carcinoma of upper-inner quadrant of left breast in female, estrogen receptor positive ([place]) 02/10/2017  . Chest pain 07/18/2015  . Hypokalemia 07/18/2015  . Vertigo 07/18/2015  . LBBB (left bundle branch block) 05/28/2009  . AODM 12/21/2007  . Essential hypertension, benign 12/21/2007  . DIASTOLIC DYSFUNCTION 80/02/2391  .  FEMORAL BRUIT, RIGHT 12/21/2007  . DYSPNEA 12/21/2007    SALISBURY,DONNA 06/08/2017, 5:27 PM  [place], [place], [address] Phone: [phone]   Fax:  [phone]  Name: Penny Huynh MRN: [id number] Date of Birth: 1933/12/09  Serafina Royals, PT 06/08/17 5:27 PM

## 2017-06-09 ENCOUNTER — Telehealth: Payer: Self-pay | Admitting: Hematology

## 2017-06-09 ENCOUNTER — Inpatient Hospital Stay (HOSPITAL_BASED_OUTPATIENT_CLINIC_OR_DEPARTMENT_OTHER): Payer: Medicare Other | Admitting: Hematology

## 2017-06-09 ENCOUNTER — Telehealth: Payer: Self-pay | Admitting: Nurse Practitioner

## 2017-06-09 ENCOUNTER — Inpatient Hospital Stay: Payer: Medicare Other

## 2017-06-09 ENCOUNTER — Encounter: Payer: Self-pay | Admitting: Hematology

## 2017-06-09 VITALS — BP 179/83 | HR 65 | Temp 98.5°F | Resp 18 | Ht 64.5 in | Wt 155.6 lb

## 2017-06-09 DIAGNOSIS — R21 Rash and other nonspecific skin eruption: Secondary | ICD-10-CM

## 2017-06-09 DIAGNOSIS — E871 Hypo-osmolality and hyponatremia: Secondary | ICD-10-CM

## 2017-06-09 DIAGNOSIS — Z79899 Other long term (current) drug therapy: Secondary | ICD-10-CM

## 2017-06-09 DIAGNOSIS — Z5111 Encounter for antineoplastic chemotherapy: Secondary | ICD-10-CM | POA: Diagnosis not present

## 2017-06-09 DIAGNOSIS — E119 Type 2 diabetes mellitus without complications: Secondary | ICD-10-CM

## 2017-06-09 DIAGNOSIS — Z9012 Acquired absence of left breast and nipple: Secondary | ICD-10-CM | POA: Diagnosis not present

## 2017-06-09 DIAGNOSIS — Z170 Estrogen receptor positive status [ER+]: Principal | ICD-10-CM

## 2017-06-09 DIAGNOSIS — I2510 Atherosclerotic heart disease of native coronary artery without angina pectoris: Secondary | ICD-10-CM

## 2017-06-09 DIAGNOSIS — Z95828 Presence of other vascular implants and grafts: Secondary | ICD-10-CM

## 2017-06-09 DIAGNOSIS — C50212 Malignant neoplasm of upper-inner quadrant of left female breast: Secondary | ICD-10-CM

## 2017-06-09 DIAGNOSIS — C50412 Malignant neoplasm of upper-outer quadrant of left female breast: Secondary | ICD-10-CM

## 2017-06-09 DIAGNOSIS — R300 Dysuria: Secondary | ICD-10-CM | POA: Diagnosis not present

## 2017-06-09 DIAGNOSIS — I10 Essential (primary) hypertension: Secondary | ICD-10-CM

## 2017-06-09 LAB — CMP (CANCER CENTER ONLY)
ALT: 14 U/L (ref 0–55)
AST: 17 U/L (ref 5–34)
Albumin: 3.3 g/dL — ABNORMAL LOW (ref 3.5–5.0)
Alkaline Phosphatase: 52 U/L (ref 40–150)
Anion gap: 4 (ref 3–11)
BILIRUBIN TOTAL: 0.3 mg/dL (ref 0.2–1.2)
BUN: 8 mg/dL (ref 7–26)
CO2: 28 mmol/L (ref 22–29)
Calcium: 9.2 mg/dL (ref 8.4–10.4)
Chloride: 97 mmol/L — ABNORMAL LOW (ref 98–109)
Creatinine: 0.71 mg/dL (ref 0.60–1.10)
Glucose, Bld: 97 mg/dL (ref 70–140)
POTASSIUM: 3.7 mmol/L (ref 3.5–5.1)
Sodium: 129 mmol/L — ABNORMAL LOW (ref 136–145)
TOTAL PROTEIN: 6.2 g/dL — AB (ref 6.4–8.3)

## 2017-06-09 LAB — CBC WITH DIFFERENTIAL (CANCER CENTER ONLY)
Basophils Absolute: 0 10*3/uL (ref 0.0–0.1)
Basophils Relative: 2 %
EOS PCT: 2 %
Eosinophils Absolute: 0.1 10*3/uL (ref 0.0–0.5)
HEMATOCRIT: 28.1 % — AB (ref 34.8–46.6)
Hemoglobin: 9.4 g/dL — ABNORMAL LOW (ref 11.6–15.9)
LYMPHS ABS: 0.9 10*3/uL (ref 0.9–3.3)
LYMPHS PCT: 29 %
MCH: 30.8 pg (ref 25.1–34.0)
MCHC: 33.5 g/dL (ref 31.5–36.0)
MCV: 91.9 fL (ref 79.5–101.0)
MONOS PCT: 9 %
Monocytes Absolute: 0.3 10*3/uL (ref 0.1–0.9)
NEUTROS ABS: 1.8 10*3/uL (ref 1.5–6.5)
Neutrophils Relative %: 58 %
Platelet Count: 341 10*3/uL (ref 145–400)
RBC: 3.05 MIL/uL — ABNORMAL LOW (ref 3.70–5.45)
RDW: 15 % — AB (ref 11.2–14.5)
WBC Count: 3.1 10*3/uL — ABNORMAL LOW (ref 3.9–10.3)

## 2017-06-09 MED ORDER — SODIUM CHLORIDE 0.9% FLUSH
10.0000 mL | INTRAVENOUS | Status: DC | PRN
  Administered 2017-06-09: 10 mL
  Filled 2017-06-09: qty 10

## 2017-06-09 MED ORDER — SODIUM CHLORIDE 0.9 % IV SOLN
60.0000 mg/m2 | Freq: Once | INTRAVENOUS | Status: AC
  Administered 2017-06-09: 108 mg via INTRAVENOUS
  Filled 2017-06-09: qty 18

## 2017-06-09 MED ORDER — ACETAMINOPHEN 325 MG PO TABS
650.0000 mg | ORAL_TABLET | Freq: Once | ORAL | Status: AC
  Administered 2017-06-09: 650 mg via ORAL

## 2017-06-09 MED ORDER — DEXAMETHASONE SODIUM PHOSPHATE 100 MG/10ML IJ SOLN
20.0000 mg | Freq: Once | INTRAMUSCULAR | Status: AC
  Administered 2017-06-09: 20 mg via INTRAVENOUS
  Filled 2017-06-09: qty 2

## 2017-06-09 MED ORDER — DIPHENHYDRAMINE HCL 50 MG/ML IJ SOLN
INTRAMUSCULAR | Status: AC
  Filled 2017-06-09: qty 1

## 2017-06-09 MED ORDER — DIPHENHYDRAMINE HCL 50 MG/ML IJ SOLN
50.0000 mg | Freq: Once | INTRAMUSCULAR | Status: AC
  Administered 2017-06-09: 50 mg via INTRAVENOUS

## 2017-06-09 MED ORDER — TRASTUZUMAB CHEMO 150 MG IV SOLR
150.0000 mg | Freq: Once | INTRAVENOUS | Status: AC
  Administered 2017-06-09: 150 mg via INTRAVENOUS
  Filled 2017-06-09: qty 7.1

## 2017-06-09 MED ORDER — FAMOTIDINE IN NACL 20-0.9 MG/50ML-% IV SOLN
20.0000 mg | Freq: Once | INTRAVENOUS | Status: AC
  Administered 2017-06-09: 20 mg via INTRAVENOUS

## 2017-06-09 MED ORDER — FAMOTIDINE IN NACL 20-0.9 MG/50ML-% IV SOLN
INTRAVENOUS | Status: AC
  Filled 2017-06-09: qty 50

## 2017-06-09 MED ORDER — ACETAMINOPHEN 325 MG PO TABS
ORAL_TABLET | ORAL | Status: AC
  Filled 2017-06-09: qty 2

## 2017-06-09 MED ORDER — SODIUM CHLORIDE 0.9 % IV SOLN
Freq: Once | INTRAVENOUS | Status: AC
  Administered 2017-06-09: 11:00:00 via INTRAVENOUS

## 2017-06-09 MED ORDER — HEPARIN SOD (PORK) LOCK FLUSH 100 UNIT/ML IV SOLN
500.0000 [IU] | Freq: Once | INTRAVENOUS | Status: AC | PRN
  Administered 2017-06-09: 500 [IU]
  Filled 2017-06-09: qty 5

## 2017-06-09 NOTE — Telephone Encounter (Signed)
Appointments scheduled AVS/Calendar printed per 5/8 los °

## 2017-06-09 NOTE — Telephone Encounter (Signed)
Opened in error

## 2017-06-09 NOTE — Patient Instructions (Signed)
[facility] Discharge Instructions for Patients Receiving Chemotherapy  Today you received the following chemotherapy agents Herceptin, Taxol  To help prevent nausea and vomiting after your treatment, we encourage you to take your nausea medication as directed  If you develop nausea and vomiting that is not controlled by your nausea medication, call the clinic.   BELOW ARE SYMPTOMS THAT SHOULD BE REPORTED IMMEDIATELY:  *FEVER GREATER THAN 100.5 F  *CHILLS WITH OR WITHOUT FEVER  NAUSEA AND VOMITING THAT IS NOT CONTROLLED WITH YOUR NAUSEA MEDICATION  *UNUSUAL SHORTNESS OF BREATH  *UNUSUAL BRUISING OR BLEEDING  TENDERNESS IN MOUTH AND THROAT WITH OR WITHOUT PRESENCE OF ULCERS  *URINARY PROBLEMS  *BOWEL PROBLEMS  UNUSUAL RASH Items with * indicate a potential emergency and should be followed up as soon as possible.  Feel free to call the clinic should you have any questions or concerns. The clinic phone number is [phone].  Please show the CHEMO ALERT CARD at check-in to the Emergency Department and triage nurse.   

## 2017-06-11 ENCOUNTER — Ambulatory Visit: Payer: Medicare Other | Admitting: Physical Therapy

## 2017-06-11 DIAGNOSIS — R2681 Unsteadiness on feet: Secondary | ICD-10-CM | POA: Diagnosis not present

## 2017-06-11 DIAGNOSIS — R293 Abnormal posture: Secondary | ICD-10-CM

## 2017-06-11 DIAGNOSIS — M25612 Stiffness of left shoulder, not elsewhere classified: Secondary | ICD-10-CM | POA: Diagnosis not present

## 2017-06-11 DIAGNOSIS — Z483 Aftercare following surgery for neoplasm: Secondary | ICD-10-CM

## 2017-06-11 NOTE — Patient Instructions (Signed)
Lie on your back with your left shoulder just at the very edge of the bed.  Then do the following:   Horizontal Abduction / Adduction    Straighten both arms in front of body at chest level. Pull arms out from midline far enough to feel a stretch at left shoulder. Then bring arms forward and together. Do 10 repetitions.  Then take arms apart to feel a stretch and hold the stretch for 30 seconds, letting gravity pull the left arm toward the floor to increase the stretch.  Do 1-2 times a day.  Copyright  V[employer]. All rights reserved.

## 2017-06-11 NOTE — Therapy (Signed)
[place], [place], [address] Phone: [phone]   Fax:  [phone]  Physical Therapy Treatment  Patient Details  Name: Penny Huynh MRN: [id number] Date of Birth: 1933/10/11 Referring Provider: [name]   Encounter Date: 06/11/2017  PT End of Session - 06/11/17 1021    Visit Number  17    Number of Visits  19    Date for PT Re-Evaluation  06/18/17    PT Start Time  0933    PT Stop Time  1017    PT Time Calculation (min)  44 min    Activity Tolerance  Patient tolerated treatment well    Behavior During Therapy  [facility] for tasks assessed/performed       Past Medical History:  Diagnosis Date  . Arthritis    "maybe a little in my left knee" (02/23/2017)  . Asthmatic bronchitis   . Cancer of left breast ([place])   . Coronary atherosclerosis    Minor nonobstructive CAD at cardiac catheterization 2009  . Dyslipidemia   . Femoral bruit 7/08   With normal ABIs  . Hypertension   . Lower extremity edema    Chronic  . Pneumonia ~ 1950   in 8th grade  . Type II diabetes mellitus ([place])     Past Surgical History:  Procedure Laterality Date  . APPENDECTOMY    . BREAST BIOPSY Left 02/2017  . CARDIAC CATHETERIZATION     "long time ago" (02/23/2017)  . MASTECTOMY COMPLETE / SIMPLE W/ SENTINEL NODE BIOPSY Left 02/23/2017   TOTAL MATECTOMY;  LEFT AXILLARY SENTINEL LYMPH NODE BIOPSY ERAS PATHWAY  . MASTECTOMY WITH RADIOACTIVE SEED GUIDED EXCISION AND AXILLARY SENTINEL LYMPH NODE BIOPSY Left 02/23/2017   Procedure: LEFT TOTAL MATECTOMY;  LEFT AXILLARY SENTINEL LYMPH NODE BIOPSY ERAS PATHWAY;  Surgeon: Excell Seltzer, MD;  Location: [place];  Service: General;  Laterality: Left;  . PILONIDAL CYST EXCISION    . PORTA CATH INSERTION  02/23/2017  . PORTACATH PLACEMENT N/A 02/23/2017   Procedure: INSERTION PORT-A-CATH;  Surgeon: Excell Seltzer, MD;  Location: [place];  Service: General;   Laterality: N/A;  . SHOULDER ARTHROSCOPY W/ ROTATOR CUFF REPAIR Right     There were no vitals filed for this visit.  Subjective Assessment - 06/11/17 0935    Subjective  Doing fine and feeling good today. Saw [name] yesterday and he wasn't able to get but a little bit of fluid off.  This morning it feels to her like less fluid than she's ever had.  Got her compression glove on Tuesday and it feels good.  Fingers only go to [place] joint.    Pertinent History  Patient underwent a left mastectomy and sentinel node biopsy (6 negative nodes) on 02/23/17. She will begin chemotherapy 03/31/17 as long as drain is removed today. She has 2 different breast cancers. Both are ER/PR positive but one is HER2 negative and one is HER2 positive. History of right shoulder scope 20 years ago and has diabetes and hypertension, both of which are well controlled.    Patient Stated Goals  See if my arm is ok    Currently in Pain?  No/denies         [facility] PT Assessment - 06/11/17 0001      AROM   Left Shoulder Flexion  139 Degrees prior to stretching;     Left Shoulder ABduction  134 Degrees prior to stretching  [place] [facility] Adult PT Treatment/Exercise - 06/11/17 0001      Shoulder Exercises: Supine   Horizontal ABduction  AROM;Both;10 reps also one long 30 second hold      Manual Therapy   Soft tissue mobilization  around left mastectomy scar in all directions    Myofascial Release  crosshands across left mastectomy in vertical and diagonal; unidirectional pull at left lateral pect area; crosshands across anterior left axilla; manual release across left axilla with left shoulder in end range flexion and abductioin    Passive ROM  in supine to left shoulder for er, flexion, and abduction      Prosthetics   Edema                PT Education - 06/11/17 1021    Education provided  Yes    Education Details  supine horizontal abduction shoulder stretch    Person(s) Educated   Patient    Methods  Explanation;Verbal cues;Handout    Comprehension  Verbalized understanding;Returned demonstration          PT Long Term Goals - 06/08/17 1725      Additional Long Term Goals   Additional Long Term Goals  Yes      PT LONG TERM GOAL #6   Title  Left shoulder abduction to at least 160 degrees    Time  2    Period  [facility] Goals - 02/08/17 2024      Patient will be able to verbalize understanding of pertinent lymphedema risk reduction practices relevant to her diagnosis specifically related to skin care.   Status  Achieved      Patient will be able to return demonstrate and/or verbalize understanding of the post-op home exercise program related to regaining shoulder range of motion.   Status  Achieved      Patient will be able to verbalize understanding of the importance of attending the postoperative After Breast Cancer Class for further lymphedema risk reduction education and therapeutic exercise.   Status  Achieved           Plan - 06/11/17 1022    Clinical Impression Statement  At end of session, pt. reported her balance was off again today, despite good testing at last session. She does have a cane she can and will use if needed.  Today she articulated that she definitely notices functional improvement in terms of her left arm when she cooks, having greater ease with reaching and doing that. She reports that the compression sleeve makes her arm feel better, and her new glove is comfortable, though the fingers only extend to PIP joints. She showed a five degree increase in left shoulder active abduction today.    Rehab Potential  Excellent    PT Frequency  2x / week    PT Duration  4 weeks    PT Treatment/Interventions  ADLs/Self Care Home Management;Therapeutic exercise;Patient/family education;Orthotic Fit/Training;Manual techniques;Passive range of motion;Manual lymph drainage;Scar mobilization;DME Instruction    PT  Next Visit Plan  Continue manual work on left chest and axilla soft tissue tightness and immobility.    PT Home Exercise Plan  focus on stretches to try to release cording; supine scapular series  Pt. returned to her previous exercise program at her church as of 05/17/17; horizontal abduction stretch in supine    Consulted and Agree with Plan of Care  Patient       Patient will  benefit from skilled therapeutic intervention in order to improve the following deficits and impairments:  Postural dysfunction, Decreased knowledge of precautions, Decreased knowledge of use of DME, Impaired UE functional use, Decreased range of motion, Decreased strength, Increased fascial restricitons, Decreased scar mobility  Visit Diagnosis: Aftercare following surgery for neoplasm  Stiffness of left shoulder, not elsewhere classified  Abnormal posture     Problem List Patient Active Problem List   Diagnosis Date Noted  . Port-A-Cath in place 04/14/2017  . Stage II breast cancer, left ([place]) 02/23/2017  . Carcinoma of upper-outer quadrant of left breast in female, estrogen receptor positive ([place]) 02/10/2017  . Carcinoma of upper-inner quadrant of left breast in female, estrogen receptor positive ([place]) 02/10/2017  . Chest pain 07/18/2015  . Hypokalemia 07/18/2015  . Vertigo 07/18/2015  . LBBB (left bundle branch block) 05/28/2009  . AODM 12/21/2007  . Essential hypertension, benign 12/21/2007  . DIASTOLIC DYSFUNCTION 33/82/5053  . FEMORAL BRUIT, RIGHT 12/21/2007  . DYSPNEA 12/21/2007    Aahil Fredin 06/11/2017, 10:26 AM  [place], [place], [address] Phone: [phone]   Fax:  [phone]  Name: Penny Huynh MRN: [id number] Date of Birth: 15-Jan-1934  Serafina Royals, PT 06/11/17 10:33 AM

## 2017-06-15 ENCOUNTER — Ambulatory Visit: Payer: Medicare Other

## 2017-06-15 DIAGNOSIS — R2681 Unsteadiness on feet: Secondary | ICD-10-CM | POA: Diagnosis not present

## 2017-06-15 DIAGNOSIS — R293 Abnormal posture: Secondary | ICD-10-CM

## 2017-06-15 DIAGNOSIS — Z483 Aftercare following surgery for neoplasm: Secondary | ICD-10-CM

## 2017-06-15 DIAGNOSIS — M25612 Stiffness of left shoulder, not elsewhere classified: Secondary | ICD-10-CM

## 2017-06-15 NOTE — Therapy (Signed)
[place], [place], [address] Phone: [phone]   Fax:  [phone]  Physical Therapy Treatment  Patient Details  Name: Penny Huynh MRN: [id number] Date of Birth: Jun 21, 1933 Referring Provider: [name]   Encounter Date: 06/15/2017  PT End of Session - 06/15/17 0933    Visit Number  18    Number of Visits  19    Date for PT Re-Evaluation  06/18/17    PT Start Time  0851    PT Stop Time  0933    PT Time Calculation (min)  42 min    Activity Tolerance  Patient tolerated treatment well    Behavior During Therapy  [facility] for tasks assessed/performed       Past Medical History:  Diagnosis Date  . Arthritis    "maybe a little in my left knee" (02/23/2017)  . Asthmatic bronchitis   . Cancer of left breast ([place])   . Coronary atherosclerosis    Minor nonobstructive CAD at cardiac catheterization 2009  . Dyslipidemia   . Femoral bruit 7/08   With normal ABIs  . Hypertension   . Lower extremity edema    Chronic  . Pneumonia ~ 1950   in 8th grade  . Type II diabetes mellitus ([place])     Past Surgical History:  Procedure Laterality Date  . APPENDECTOMY    . BREAST BIOPSY Left 02/2017  . CARDIAC CATHETERIZATION     "long time ago" (02/23/2017)  . MASTECTOMY COMPLETE / SIMPLE W/ SENTINEL NODE BIOPSY Left 02/23/2017   TOTAL MATECTOMY;  LEFT AXILLARY SENTINEL LYMPH NODE BIOPSY ERAS PATHWAY  . MASTECTOMY WITH RADIOACTIVE SEED GUIDED EXCISION AND AXILLARY SENTINEL LYMPH NODE BIOPSY Left 02/23/2017   Procedure: LEFT TOTAL MATECTOMY;  LEFT AXILLARY SENTINEL LYMPH NODE BIOPSY ERAS PATHWAY;  Surgeon: Excell Seltzer, MD;  Location: [place];  Service: General;  Laterality: Left;  . PILONIDAL CYST EXCISION    . PORTA CATH INSERTION  02/23/2017  . PORTACATH PLACEMENT N/A 02/23/2017   Procedure: INSERTION PORT-A-CATH;  Surgeon: Excell Seltzer, MD;  Location: [place];  Service: General;   Laterality: N/A;  . SHOULDER ARTHROSCOPY W/ ROTATOR CUFF REPAIR Right     There were no vitals filed for this visit.  Subjective Assessment - 06/15/17 0853    Subjective  Nothing really new since I was here last except I've been wearing the compression sleeve and glove and other than the fingers being a little too short, it's comfortable. I have noticed that my fingers swell a little past the end of the glove though so I want to make sure I tell [facility] that.    Pertinent History  Patient underwent a left mastectomy and sentinel node biopsy (6 negative nodes) on 02/23/17. She will begin chemotherapy 03/31/17 as long as drain is removed today. She has 2 different breast cancers. Both are ER/PR positive but one is HER2 negative and one is HER2 positive. History of right shoulder scope 20 years ago and has diabetes and hypertension, both of which are well controlled.    Patient Stated Goals  See if my arm is ok    Currently in Pain?  No/denies                       [facility] Adult PT Treatment/Exercise - 06/15/17 0001      Manual Therapy   Soft tissue mobilization  around left mastectomy scar in all directions  Myofascial Release  crosshands across left mastectomy in vertical and diagonal; unidirectional pull at left lateral pect area; crosshands across anterior left axilla; manual release across left axilla with left shoulder in end range flexion and abductioin    Manual Lymphatic Drainage (MLD)  Briefly to Lt lateral trunk: Short neck, 5 diaphragmatic breaths, Lt inguinal nodes, and Lt axillo-inguinal anastomosis focusing at area of swelling     Passive ROM  in supine to left shoulder for er, flexion, and abduction                  PT Long Term Goals - 06/08/17 1725      Additional Long Term Goals   Additional Long Term Goals  Yes      PT LONG TERM GOAL #6   Title  Left shoulder abduction to at least 160 degrees    Time  2    Period  [facility] Goals - 02/08/17 2024      Patient will be able to verbalize understanding of pertinent lymphedema risk reduction practices relevant to her diagnosis specifically related to skin care.   Status  Achieved      Patient will be able to return demonstrate and/or verbalize understanding of the post-op home exercise program related to regaining shoulder range of motion.   Status  Achieved      Patient will be able to verbalize understanding of the importance of attending the postoperative After Breast Cancer Class for further lymphedema risk reduction education and therapeutic exercise.   Status  Achieved           Plan - 06/15/17 0934    Clinical Impression Statement  Pt reports balance still feels off but does better with her cane. She tolerated all manual therapy well and reports Lt shoulder and chest feeling looser at end of session. She plans to go to [facility] to show them her glove and see ifthey can remeasure so the other one they order can be corrected if needed.     Rehab Potential  Excellent    PT Frequency  2x / week    PT Duration  4 weeks    PT Treatment/Interventions  ADLs/Self Care Home Management;Therapeutic exercise;Patient/family education;Orthotic Fit/Training;Manual techniques;Passive range of motion;Manual lymph drainage;Scar mobilization;DME Instruction    PT Next Visit Plan  Renewal or D/C next visit. Remeasure for goal assess. Continue manual work on left chest and axilla soft tissue tightness and immobility.    Consulted and Agree with Plan of Care  Patient       Patient will benefit from skilled therapeutic intervention in order to improve the following deficits and impairments:  Postural dysfunction, Decreased knowledge of precautions, Decreased knowledge of use of DME, Impaired UE functional use, Decreased range of motion, Decreased strength, Increased fascial restricitons, Decreased scar mobility  Visit Diagnosis: Aftercare following  surgery for neoplasm  Stiffness of left shoulder, not elsewhere classified  Abnormal posture     Problem List Patient Active Problem List   Diagnosis Date Noted  . Port-A-Cath in place 04/14/2017  . Stage II breast cancer, left ([place]) 02/23/2017  . Carcinoma of upper-outer quadrant of left breast in female, estrogen receptor positive ([place]) 02/10/2017  . Carcinoma of upper-inner quadrant of left breast in female, estrogen receptor positive ([place]) 02/10/2017  . Chest pain 07/18/2015  . Hypokalemia 07/18/2015  . Vertigo 07/18/2015  . LBBB (left bundle branch block)  05/28/2009  . AODM 12/21/2007  . Essential hypertension, benign 12/21/2007  . DIASTOLIC DYSFUNCTION 22/58/3462  . FEMORAL BRUIT, RIGHT 12/21/2007  . DYSPNEA 12/21/2007    Otelia Limes, PTA 06/15/2017, 9:37 AM  [place], [place], [address] Phone: [phone]   Fax:  [phone]  Name: Penny Huynh MRN: [id number] Date of Birth: [birth year]

## 2017-06-16 ENCOUNTER — Inpatient Hospital Stay: Payer: Medicare Other

## 2017-06-16 VITALS — BP 157/66 | HR 67 | Temp 97.9°F | Resp 18

## 2017-06-16 DIAGNOSIS — Z9012 Acquired absence of left breast and nipple: Secondary | ICD-10-CM | POA: Diagnosis not present

## 2017-06-16 DIAGNOSIS — Z170 Estrogen receptor positive status [ER+]: Principal | ICD-10-CM

## 2017-06-16 DIAGNOSIS — C50212 Malignant neoplasm of upper-inner quadrant of left female breast: Secondary | ICD-10-CM | POA: Diagnosis not present

## 2017-06-16 DIAGNOSIS — Z95828 Presence of other vascular implants and grafts: Secondary | ICD-10-CM

## 2017-06-16 DIAGNOSIS — C50412 Malignant neoplasm of upper-outer quadrant of left female breast: Secondary | ICD-10-CM | POA: Diagnosis not present

## 2017-06-16 DIAGNOSIS — R300 Dysuria: Secondary | ICD-10-CM | POA: Diagnosis not present

## 2017-06-16 DIAGNOSIS — Z5111 Encounter for antineoplastic chemotherapy: Secondary | ICD-10-CM | POA: Diagnosis not present

## 2017-06-16 LAB — CBC WITH DIFFERENTIAL (CANCER CENTER ONLY)
BASOS ABS: 0 10*3/uL (ref 0.0–0.1)
BASOS PCT: 1 %
EOS PCT: 3 %
Eosinophils Absolute: 0.1 10*3/uL (ref 0.0–0.5)
HCT: 29.3 % — ABNORMAL LOW (ref 34.8–46.6)
Hemoglobin: 9.9 g/dL — ABNORMAL LOW (ref 11.6–15.9)
LYMPHS PCT: 25 %
Lymphs Abs: 1 10*3/uL (ref 0.9–3.3)
MCH: 31.5 pg (ref 25.1–34.0)
MCHC: 33.8 g/dL (ref 31.5–36.0)
MCV: 93.3 fL (ref 79.5–101.0)
MONO ABS: 0.4 10*3/uL (ref 0.1–0.9)
Monocytes Relative: 10 %
NEUTROS ABS: 2.3 10*3/uL (ref 1.5–6.5)
Neutrophils Relative %: 61 %
PLATELETS: 316 10*3/uL (ref 145–400)
RBC: 3.15 MIL/uL — AB (ref 3.70–5.45)
RDW: 15.8 % — AB (ref 11.2–14.5)
WBC: 3.8 10*3/uL — AB (ref 3.9–10.3)

## 2017-06-16 LAB — CMP (CANCER CENTER ONLY)
ALT: 15 U/L (ref 0–55)
AST: 15 U/L (ref 5–34)
Albumin: 3.6 g/dL (ref 3.5–5.0)
Alkaline Phosphatase: 50 U/L (ref 40–150)
Anion gap: 6 (ref 3–11)
BUN: 12 mg/dL (ref 7–26)
CO2: 28 mmol/L (ref 22–29)
Calcium: 9.2 mg/dL (ref 8.4–10.4)
Chloride: 95 mmol/L — ABNORMAL LOW (ref 98–109)
Creatinine: 0.77 mg/dL (ref 0.60–1.10)
GFR, Est AFR Am: >60 mL/min (ref >60)
GFR, Estimated: >60 mL/min (ref >60)
Glucose, Bld: 133 mg/dL (ref 70–140)
Potassium: 3.5 mmol/L (ref 3.5–5.1)
Sodium: 129 mmol/L — ABNORMAL LOW (ref 136–145)
Total Bilirubin: 0.5 mg/dL (ref 0.2–1.2)
Total Protein: 6.4 g/dL (ref 6.4–8.3)

## 2017-06-16 MED ORDER — ACETAMINOPHEN 325 MG PO TABS
650.0000 mg | ORAL_TABLET | Freq: Once | ORAL | Status: AC
  Administered 2017-06-16: 650 mg via ORAL

## 2017-06-16 MED ORDER — SODIUM CHLORIDE 0.9% FLUSH
10.0000 mL | INTRAVENOUS | Status: DC | PRN
  Administered 2017-06-16: 10 mL
  Filled 2017-06-16: qty 10

## 2017-06-16 MED ORDER — SODIUM CHLORIDE 0.9 % IV SOLN
20.0000 mg | Freq: Once | INTRAVENOUS | Status: AC
  Administered 2017-06-16: 20 mg via INTRAVENOUS
  Filled 2017-06-16: qty 2

## 2017-06-16 MED ORDER — FAMOTIDINE IN NACL 20-0.9 MG/50ML-% IV SOLN
INTRAVENOUS | Status: AC
  Filled 2017-06-16: qty 50

## 2017-06-16 MED ORDER — SODIUM CHLORIDE 0.9 % IV SOLN
60.0000 mg/m2 | Freq: Once | INTRAVENOUS | Status: AC
  Administered 2017-06-16: 108 mg via INTRAVENOUS
  Filled 2017-06-16: qty 18

## 2017-06-16 MED ORDER — SODIUM CHLORIDE 0.9 % IV SOLN
Freq: Once | INTRAVENOUS | Status: AC
  Administered 2017-06-16: 14:00:00 via INTRAVENOUS

## 2017-06-16 MED ORDER — FAMOTIDINE IN NACL 20-0.9 MG/50ML-% IV SOLN
20.0000 mg | Freq: Once | INTRAVENOUS | Status: AC
  Administered 2017-06-16: 20 mg via INTRAVENOUS

## 2017-06-16 MED ORDER — TRASTUZUMAB CHEMO 150 MG IV SOLR
150.0000 mg | Freq: Once | INTRAVENOUS | Status: AC
  Administered 2017-06-16: 150 mg via INTRAVENOUS
  Filled 2017-06-16: qty 7.14

## 2017-06-16 MED ORDER — ACETAMINOPHEN 325 MG PO TABS
ORAL_TABLET | ORAL | Status: AC
  Filled 2017-06-16: qty 2

## 2017-06-16 MED ORDER — HEPARIN SOD (PORK) LOCK FLUSH 100 UNIT/ML IV SOLN
500.0000 [IU] | Freq: Once | INTRAVENOUS | Status: AC | PRN
  Administered 2017-06-16: 500 [IU]
  Filled 2017-06-16: qty 5

## 2017-06-16 MED ORDER — DIPHENHYDRAMINE HCL 50 MG/ML IJ SOLN
50.0000 mg | Freq: Once | INTRAMUSCULAR | Status: AC
  Administered 2017-06-16: 50 mg via INTRAVENOUS

## 2017-06-16 MED ORDER — DIPHENHYDRAMINE HCL 50 MG/ML IJ SOLN
INTRAMUSCULAR | Status: AC
  Filled 2017-06-16: qty 1

## 2017-06-16 NOTE — Patient Instructions (Signed)
[facility] Discharge Instructions for Patients Receiving Chemotherapy  Today you received the following chemotherapy agents Herceptin, Taxol  To help prevent nausea and vomiting after your treatment, we encourage you to take your nausea medication as directed  If you develop nausea and vomiting that is not controlled by your nausea medication, call the clinic.   BELOW ARE SYMPTOMS THAT SHOULD BE REPORTED IMMEDIATELY:  *FEVER GREATER THAN 100.5 F  *CHILLS WITH OR WITHOUT FEVER  NAUSEA AND VOMITING THAT IS NOT CONTROLLED WITH YOUR NAUSEA MEDICATION  *UNUSUAL SHORTNESS OF BREATH  *UNUSUAL BRUISING OR BLEEDING  TENDERNESS IN MOUTH AND THROAT WITH OR WITHOUT PRESENCE OF ULCERS  *URINARY PROBLEMS  *BOWEL PROBLEMS  UNUSUAL RASH Items with * indicate a potential emergency and should be followed up as soon as possible.  Feel free to call the clinic should you have any questions or concerns. The clinic phone number is [phone].  Please show the CHEMO ALERT CARD at check-in to the Emergency Department and triage nurse.   

## 2017-06-18 ENCOUNTER — Ambulatory Visit: Payer: Medicare Other | Admitting: Physical Therapy

## 2017-06-18 DIAGNOSIS — M25612 Stiffness of left shoulder, not elsewhere classified: Secondary | ICD-10-CM

## 2017-06-18 DIAGNOSIS — Z483 Aftercare following surgery for neoplasm: Secondary | ICD-10-CM | POA: Diagnosis not present

## 2017-06-18 DIAGNOSIS — R293 Abnormal posture: Secondary | ICD-10-CM

## 2017-06-18 DIAGNOSIS — R2681 Unsteadiness on feet: Secondary | ICD-10-CM | POA: Diagnosis not present

## 2017-06-18 NOTE — Therapy (Addendum)
[place], [place], [address] Phone: [phone]   Fax:  [phone]  Physical Therapy Treatment  Patient Details  Name: Penny Huynh MRN: [id number] Date of Birth: 1933/08/01 Referring Provider: [name]   Encounter Date: 06/18/2017  PT End of Session - 06/18/17 1015    Visit Number  19    Number of Visits  27    Date for PT Re-Evaluation  07/23/17    PT Start Time  0930    PT Stop Time  1017    PT Time Calculation (min)  47 min    Activity Tolerance  Patient tolerated treatment well    Behavior During Therapy  [facility] for tasks assessed/performed       Past Medical History:  Diagnosis Date  . Arthritis    "maybe a little in my left knee" (02/23/2017)  . Asthmatic bronchitis   . Cancer of left breast ([place])   . Coronary atherosclerosis    Minor nonobstructive CAD at cardiac catheterization 2009  . Dyslipidemia   . Femoral bruit 7/08   With normal ABIs  . Hypertension   . Lower extremity edema    Chronic  . Pneumonia ~ 1950   in 8th grade  . Type II diabetes mellitus ([place])     Past Surgical History:  Procedure Laterality Date  . APPENDECTOMY    . BREAST BIOPSY Left 02/2017  . CARDIAC CATHETERIZATION     "long time ago" (02/23/2017)  . MASTECTOMY COMPLETE / SIMPLE W/ SENTINEL NODE BIOPSY Left 02/23/2017   TOTAL MATECTOMY;  LEFT AXILLARY SENTINEL LYMPH NODE BIOPSY ERAS PATHWAY  . MASTECTOMY WITH RADIOACTIVE SEED GUIDED EXCISION AND AXILLARY SENTINEL LYMPH NODE BIOPSY Left 02/23/2017   Procedure: LEFT TOTAL MATECTOMY;  LEFT AXILLARY SENTINEL LYMPH NODE BIOPSY ERAS PATHWAY;  Surgeon: Excell Seltzer, MD;  Location: [place];  Service: General;  Laterality: Left;  . PILONIDAL CYST EXCISION    . PORTA CATH INSERTION  02/23/2017  . PORTACATH PLACEMENT N/A 02/23/2017   Procedure: INSERTION PORT-A-CATH;  Surgeon: Excell Seltzer, MD;  Location: [place];  Service: General;   Laterality: N/A;  . SHOULDER ARTHROSCOPY W/ ROTATOR CUFF REPAIR Right     There were no vitals filed for this visit.  Subjective Assessment - 06/18/17 0931    Subjective  Talked to [facility] about her glove; they've ordered a custom fit that will have fingers that are longer.  It will take 2-3 weeks to come in. "I feel like my arm is doing better, like it's loosened up some."    Pertinent History  Patient underwent a left mastectomy and sentinel node biopsy (6 negative nodes) on 02/23/17. She will begin chemotherapy 03/31/17 as long as drain is removed today. She has 2 different breast cancers. Both are ER/PR positive but one is HER2 negative and one is HER2 positive. History of right shoulder scope 20 years ago and has diabetes and hypertension, both of which are well controlled.    Patient Stated Goals  See if my arm is ok    Currently in Pain?  No/denies         [facility] PT Assessment - 06/18/17 0001      AROM   Left Shoulder ABduction  146 Degrees prior to stretching                   [facility] Adult PT Treatment/Exercise - 06/18/17 0001      Manual Therapy  Soft tissue mobilization  around left mastectomy scar in all directions    Myofascial Release  crosshands across left mastectomy in vertical and diagonal; unidirectional pull at left lateral pect area; crosshands across anterior left axilla; manual release across left axilla with left shoulder in end range abductioin    Passive ROM  in supine to left shoulder for er, flexion, and abduction, prolonged stretches                  PT Long Term Goals - 06/18/17 1019      PT LONG TERM GOAL #1   Title  Patient will be independent in her home exercise program for shoulder ROM.    Status  Achieved      PT LONG TERM GOAL #2   Title  Patient will demonstrate shoulder flexion AROM to be >/= 140 degrees for increased ease reaching overhead.    Baseline  65 degrees; 131 degrees - 04/13/17, 04/19/17- 130; 141 on  05/17/17    Status  Achieved      PT LONG TERM GOAL #3   Title  Patient will demonstrate shoulder abduction AROM to be >/= 140 degrees for increased ease reaching overhead.    Baseline  52 degrees; 129 degrees - 04/13/17, 04/19/17- 155    Status  Achieved      PT LONG TERM GOAL #4   Title  Patient will score </= 10 on the DASH indicating increased shoulder function.    Baseline  54.55, 04/19/17- 18.18; 9.09 on 06/04/17 with patient answering 10 of 11 questions    Status  Achieved      PT LONG TERM GOAL #5   Title  Patient will verbalize she understands lymphedema risk reduction.    Baseline  04/19/17- re educated pt in risk reduction handout; gave handout on 05/17/17    Status  Achieved      PT LONG TERM GOAL #6   Title  Left shoulder abduction to at least 160 degrees    Baseline  146 on 06/18/17    Status  [facility] Goals - 02/08/17 2024      Patient will be able to verbalize understanding of pertinent lymphedema risk reduction practices relevant to her diagnosis specifically related to skin care.   Status  Achieved      Patient will be able to return demonstrate and/or verbalize understanding of the post-op home exercise program related to regaining shoulder range of motion.   Status  Achieved      Patient will be able to verbalize understanding of the importance of attending the postoperative After Breast Cancer Class for further lymphedema risk reduction education and therapeutic exercise.   Status  Achieved           Plan - 06/18/17 1016    Clinical Impression Statement  Pt.'s left shoulder active abduction has improved, but newer goal has not yet been met.  She tolerates manual vigorous stretches well.    Rehab Potential  Excellent    PT Frequency  2x / week    PT Duration  4 weeks    PT Treatment/Interventions  ADLs/Self Care Home Management;Therapeutic exercise;Patient/family education;Orthotic Fit/Training;Manual techniques;Passive range of  motion;Manual lymph drainage;Scar mobilization;DME Instruction    PT Next Visit Plan  Renewal done this visit. Do 10th visit progress note next visit. Continue manual work on left chest and axilla soft tissue tightness and immobility.    PT Home Exercise Plan  focus  on stretches to try to release cording; supine scapular series  Pt. returned to her previous exercise program at her church as of 05/17/17; horizontal abduction stretch in supine    Consulted and Agree with Plan of Care  Patient       Patient will benefit from skilled therapeutic intervention in order to improve the following deficits and impairments:  Postural dysfunction, Decreased knowledge of precautions, Decreased knowledge of use of DME, Impaired UE functional use, Decreased range of motion, Decreased strength, Increased fascial restricitons, Decreased scar mobility  Visit Diagnosis: Aftercare following surgery for neoplasm - Plan: PT plan of care cert/re-cert  Stiffness of left shoulder, not elsewhere classified - Plan: PT plan of care cert/re-cert  Abnormal posture - Plan: PT plan of care cert/re-cert     Problem List Patient Active Problem List   Diagnosis Date Noted  . Port-A-Cath in place 04/14/2017  . Stage II breast cancer, left ([place]) 02/23/2017  . Carcinoma of upper-outer quadrant of left breast in female, estrogen receptor positive ([place]) 02/10/2017  . Carcinoma of upper-inner quadrant of left breast in female, estrogen receptor positive ([place]) 02/10/2017  . Chest pain 07/18/2015  . Hypokalemia 07/18/2015  . Vertigo 07/18/2015  . LBBB (left bundle branch block) 05/28/2009  . AODM 12/21/2007  . Essential hypertension, benign 12/21/2007  . DIASTOLIC DYSFUNCTION 00/34/9611  . FEMORAL BRUIT, RIGHT 12/21/2007  . DYSPNEA 12/21/2007    Valetta Mulroy 06/18/2017, 10:27 AM  [place], [place], [address] Phone: [phone]   Fax:   [phone]  Name: TORREY HORSEMAN MRN: [id number] Date of Birth: Oct 06, 1933  Serafina Royals, PT 06/18/17 10:27 AM

## 2017-06-22 ENCOUNTER — Encounter: Payer: Medicare Other | Admitting: Physical Therapy

## 2017-06-23 ENCOUNTER — Inpatient Hospital Stay (HOSPITAL_BASED_OUTPATIENT_CLINIC_OR_DEPARTMENT_OTHER): Payer: Medicare Other | Admitting: Nurse Practitioner

## 2017-06-23 ENCOUNTER — Inpatient Hospital Stay: Payer: Medicare Other

## 2017-06-23 ENCOUNTER — Encounter: Payer: Self-pay | Admitting: Nurse Practitioner

## 2017-06-23 VITALS — BP 172/69 | HR 74 | Temp 98.6°F | Resp 18 | Ht 64.5 in | Wt 155.1 lb

## 2017-06-23 DIAGNOSIS — Z9012 Acquired absence of left breast and nipple: Secondary | ICD-10-CM

## 2017-06-23 DIAGNOSIS — T451X5A Adverse effect of antineoplastic and immunosuppressive drugs, initial encounter: Secondary | ICD-10-CM

## 2017-06-23 DIAGNOSIS — Z170 Estrogen receptor positive status [ER+]: Principal | ICD-10-CM

## 2017-06-23 DIAGNOSIS — R8281 Pyuria: Secondary | ICD-10-CM

## 2017-06-23 DIAGNOSIS — C50412 Malignant neoplasm of upper-outer quadrant of left female breast: Secondary | ICD-10-CM

## 2017-06-23 DIAGNOSIS — C50212 Malignant neoplasm of upper-inner quadrant of left female breast: Secondary | ICD-10-CM

## 2017-06-23 DIAGNOSIS — Z79899 Other long term (current) drug therapy: Secondary | ICD-10-CM | POA: Diagnosis not present

## 2017-06-23 DIAGNOSIS — G620 Drug-induced polyneuropathy: Secondary | ICD-10-CM

## 2017-06-23 DIAGNOSIS — E871 Hypo-osmolality and hyponatremia: Secondary | ICD-10-CM | POA: Diagnosis not present

## 2017-06-23 DIAGNOSIS — Z5111 Encounter for antineoplastic chemotherapy: Secondary | ICD-10-CM | POA: Diagnosis not present

## 2017-06-23 DIAGNOSIS — R300 Dysuria: Secondary | ICD-10-CM

## 2017-06-23 DIAGNOSIS — I890 Lymphedema, not elsewhere classified: Secondary | ICD-10-CM

## 2017-06-23 DIAGNOSIS — Z95828 Presence of other vascular implants and grafts: Secondary | ICD-10-CM

## 2017-06-23 LAB — CMP (CANCER CENTER ONLY)
ALBUMIN: 3.6 g/dL (ref 3.5–5.0)
ALT: 17 U/L (ref 0–55)
ANION GAP: 8 (ref 3–11)
AST: 18 U/L (ref 5–34)
Alkaline Phosphatase: 52 U/L (ref 40–150)
BILIRUBIN TOTAL: 0.5 mg/dL (ref 0.2–1.2)
BUN: 11 mg/dL (ref 7–26)
CHLORIDE: 97 mmol/L — AB (ref 98–109)
CO2: 26 mmol/L (ref 22–29)
Calcium: 9.3 mg/dL (ref 8.4–10.4)
Creatinine: 0.69 mg/dL (ref 0.60–1.10)
GFR, Est AFR Am: >60 mL/min (ref >60)
GLUCOSE: 75 mg/dL (ref 70–140)
POTASSIUM: 4 mmol/L (ref 3.5–5.1)
Sodium: 131 mmol/L — ABNORMAL LOW (ref 136–145)
TOTAL PROTEIN: 6.5 g/dL (ref 6.4–8.3)

## 2017-06-23 LAB — URINALYSIS, COMPLETE (UACMP) WITH MICROSCOPIC
BACTERIA UA: NONE SEEN
BILIRUBIN URINE: NEGATIVE
Glucose, UA: NEGATIVE mg/dL
HGB URINE DIPSTICK: NEGATIVE
KETONES UR: NEGATIVE mg/dL
NITRITE: NEGATIVE
PROTEIN: NEGATIVE mg/dL
Specific Gravity, Urine: 1.009 (ref 1.005–1.030)
pH: 8 (ref 5.0–8.0)

## 2017-06-23 LAB — CBC WITH DIFFERENTIAL (CANCER CENTER ONLY)
BASOS PCT: 2 %
Basophils Absolute: 0.1 10*3/uL (ref 0.0–0.1)
EOS PCT: 3 %
Eosinophils Absolute: 0.1 10*3/uL (ref 0.0–0.5)
HEMATOCRIT: 29.7 % — AB (ref 34.8–46.6)
Hemoglobin: 10 g/dL — ABNORMAL LOW (ref 11.6–15.9)
Lymphocytes Relative: 34 %
Lymphs Abs: 1.2 10*3/uL (ref 0.9–3.3)
MCH: 31.1 pg (ref 25.1–34.0)
MCHC: 33.5 g/dL (ref 31.5–36.0)
MCV: 92.9 fL (ref 79.5–101.0)
MONO ABS: 0.4 10*3/uL (ref 0.1–0.9)
MONOS PCT: 11 %
NEUTROS ABS: 1.9 10*3/uL (ref 1.5–6.5)
Neutrophils Relative %: 50 %
PLATELETS: 297 10*3/uL (ref 145–400)
RBC: 3.2 MIL/uL — ABNORMAL LOW (ref 3.70–5.45)
RDW: 16.2 % — AB (ref 11.2–14.5)
WBC Count: 3.6 10*3/uL — ABNORMAL LOW (ref 3.9–10.3)

## 2017-06-23 MED ORDER — DIPHENHYDRAMINE HCL 50 MG/ML IJ SOLN
50.0000 mg | Freq: Once | INTRAMUSCULAR | Status: AC
  Administered 2017-06-23: 50 mg via INTRAVENOUS

## 2017-06-23 MED ORDER — DIPHENHYDRAMINE HCL 50 MG/ML IJ SOLN
INTRAMUSCULAR | Status: AC
  Filled 2017-06-23: qty 1

## 2017-06-23 MED ORDER — SODIUM CHLORIDE 0.9% FLUSH
10.0000 mL | INTRAVENOUS | Status: DC | PRN
  Administered 2017-06-23: 10 mL
  Filled 2017-06-23: qty 10

## 2017-06-23 MED ORDER — HEPARIN SOD (PORK) LOCK FLUSH 100 UNIT/ML IV SOLN
500.0000 [IU] | Freq: Once | INTRAVENOUS | Status: AC | PRN
  Administered 2017-06-23: 500 [IU]
  Filled 2017-06-23: qty 5

## 2017-06-23 MED ORDER — SODIUM CHLORIDE 0.9 % IV SOLN
Freq: Once | INTRAVENOUS | Status: AC
  Administered 2017-06-23: 13:00:00 via INTRAVENOUS

## 2017-06-23 MED ORDER — ACETAMINOPHEN 325 MG PO TABS
650.0000 mg | ORAL_TABLET | Freq: Once | ORAL | Status: AC
  Administered 2017-06-23: 650 mg via ORAL

## 2017-06-23 MED ORDER — ACETAMINOPHEN 325 MG PO TABS
ORAL_TABLET | ORAL | Status: AC
  Filled 2017-06-23: qty 2

## 2017-06-23 MED ORDER — TRASTUZUMAB CHEMO 150 MG IV SOLR: 2.0000 mg/kg | Freq: Once | INTRAVENOUS | Status: DC

## 2017-06-23 MED ORDER — CIPROFLOXACIN HCL 250 MG PO TABS: 250.0000 mg | ORAL_TABLET | Freq: Two times a day (BID) | ORAL | 0 refills | Status: DC

## 2017-06-23 MED ORDER — TRASTUZUMAB CHEMO 150 MG IV SOLR
450.0000 mg | Freq: Once | INTRAVENOUS | Status: AC
  Administered 2017-06-23: 450 mg via INTRAVENOUS
  Filled 2017-06-23: qty 21.43

## 2017-06-23 NOTE — Patient Instructions (Addendum)
Anastrozole tablets What is this medicine? ANASTROZOLE (an AS troe zole) is used to treat breast cancer in women who have gone through menopause. Some types of breast cancer depend on estrogen to grow, and this medicine can stop tumor growth by blocking estrogen production. This medicine may be used for other purposes; ask your health care provider or pharmacist if you have questions. COMMON BRAND NAME(S): Arimidex What should I tell my health care provider before I take this medicine? They need to know if you have any of these conditions: -liver disease -an unusual or allergic reaction to anastrozole, other medicines, foods, dyes, or preservatives -pregnant or trying to get pregnant -breast-feeding How should I use this medicine? Take this medicine by mouth with a glass of water. Follow the directions on the prescription label. You can take this medicine with or without food. Take your doses at regular intervals. Do not take your medicine more often than directed. Do not stop taking except on the advice of your doctor or health care professional. Talk to your pediatrician regarding the use of this medicine in children. Special care may be needed. Overdosage: If you think you have taken too much of this medicine contact a poison control center or emergency room at once. NOTE: This medicine is only for you. Do not share this medicine with others. What if I miss a dose? If you miss a dose, take it as soon as you can. If it is almost time for your next dose, take only that dose. Do not take double or extra doses. What may interact with this medicine? Do not take this medicine with any of the following medications: -female hormones, like estrogens or progestins and birth control pills This medicine may also interact with the following medications: -tamoxifen This list may not describe all possible interactions. Give your health care provider a list of all the medicines, herbs, non-prescription  drugs, or dietary supplements you use. Also tell them if you smoke, drink alcohol, or use illegal drugs. Some items may interact with your medicine. What should I watch for while using this medicine? Visit your doctor or health care professional for regular checks on your progress. Let your doctor or health care professional know about any unusual vaginal bleeding. Do not treat yourself for diarrhea, nausea, vomiting or other side effects. Ask your doctor or health care professional for advice. What side effects may I notice from receiving this medicine? Side effects that you should report to your doctor or health care professional as soon as possible: -allergic reactions like skin rash, itching or hives, swelling of the face, lips, or tongue -any new or unusual symptoms -breathing problems -chest pain -leg pain or swelling -vomiting Side effects that usually do not require medical attention (report to your doctor or health care professional if they continue or are bothersome): -back or bone pain -cough, or throat infection -diarrhea or constipation -dizziness -headache -hot flashes -loss of appetite -nausea -sweating -weakness and tiredness -weight gain This list may not describe all possible side effects. Call your doctor for medical advice about side effects. You may report side effects to FDA at 1-800-FDA-1088. Where should I keep my medicine? Keep out of the reach of children. Store at room temperature between 20 and 25 degrees C (68 and 77 degrees F). Throw away any unused medicine after the expiration date. NOTE: This sheet is a summary. It may not cover all possible information. If you have questions about this medicine, talk to your doctor, pharmacist,   or health care provider.  2018 [employer]vier/Gold Standard (2007-04-01 16:31:52)   Ado-Trastuzumab Emtansine for injection (alternative to herceptin)  What is this medicine? ADO-TRASTUZUMAB EMTANSINE (ADD oh traz TOO zuh mab em  TAN zine) is a monoclonal antibody combined with chemotherapy. It is used to treat breast cancer. This medicine may be used for other purposes; ask your health care provider or pharmacist if you have questions. COMMON BRAND NAME(S): Kadcyla What should I tell my health care provider before I take this medicine? They need to know if you have any of these conditions: -heart disease -heart failure -infection (especially a virus infection such as chickenpox, cold sores, or herpes) -liver disease -lung or breathing disease, like asthma -an unusual or allergic reaction to ado-trastuzumab emtansine, other medications, foods, dyes, or preservatives -pregnant or trying to get pregnant -breast-feeding How should I use this medicine? This medicine is for infusion into a vein. It is given by a health care professional in a hospital or clinic setting. Talk to your pediatrician regarding the use of this medicine in children. Special care may be needed. Overdosage: If you think you have taken too much of this medicine contact a poison control center or emergency room at once. NOTE: This medicine is only for you. Do not share this medicine with others. What if I miss a dose? It is important not to miss your dose. Call your doctor or health care professional if you are unable to keep an appointment. What may interact with this medicine? This medicine may also interact with the following medications: -atazanavir -boceprevir -clarithromycin -delavirdine -indinavir -dalfopristin; quinupristin -isoniazid, INH -itraconazole -ketoconazole -nefazodone -nelfinavir -ritonavir -telaprevir -telithromycin -tipranavir -voriconazole This list may not describe all possible interactions. Give your health care provider a list of all the medicines, herbs, non-prescription drugs, or dietary supplements you use. Also tell them if you smoke, drink alcohol, or use illegal drugs. Some items may interact with your  medicine. What should I watch for while using this medicine? Visit your doctor for checks on your progress. This drug may make you feel generally unwell. This is not uncommon, as chemotherapy can affect healthy cells as well as cancer cells. Report any side effects. Continue your course of treatment even though you feel ill unless your doctor tells you to stop. You may need blood work done while you are taking this medicine. Call your doctor or health care professional for advice if you get a fever, chills or sore throat, or other symptoms of a cold or flu. Do not treat yourself. This drug decreases your body's ability to fight infections. Try to avoid being around people who are sick. Be careful brushing and flossing your teeth or using a toothpick because you may get an infection or bleed more easily. If you have any dental work done, tell your dentist you are receiving this medicine. Avoid taking products that contain aspirin, acetaminophen, ibuprofen, naproxen, or ketoprofen unless instructed by your doctor. These medicines may hide a fever. Do not become pregnant while taking this medicine or for 7 months after stopping it, men with female partners should use contraception during treatment and for 4 months after the last dose. Women should inform their doctor if they wish to become pregnant or think they might be pregnant. There is a potential for serious side effects to an unborn child. Do not breast-feed an infant while taking this medicine or for 7 months after the last dose. Men who have a partner who is pregnant or who is  capable of becoming pregnant should use a condom during sexual activity while taking this medicine and for 4 months after stopping it. Men should inform their doctors if they wish to father a child. This medicine may lower sperm counts. Talk to your health care professional or pharmacist for more information. What side effects may I notice from receiving this medicine? Side  effects that you should report to your doctor or health care professional as soon as possible: -allergic reactions like skin rash, itching or hives, swelling of the face, lips, or tongue -breathing problems -chest pain or palpitations -fever or chills, sore throat -general ill feeling or flu-like symptoms -light-colored stools -nausea, vomiting -pain, tingling, numbness in the hands or feet -signs and symptoms of bleeding such as bloody or black, tarry stools; red or dark-brown urine; spitting up blood or brown material that looks like coffee grounds; red spots on the skin; unusual bruising or bleeding from the eye, gums, or nose -swelling of the legs or ankles -yellowing of the eyes or skin Side effects that usually do not require medical attention (report to your doctor or health care professional if they continue or are bothersome): -changes in taste -constipation -dizziness -headache -joint pain -muscle pain -trouble sleeping -unusually weak or tired This list may not describe all possible side effects. Call your doctor for medical advice about side effects. You may report side effects to FDA at 1-800-FDA-1088. Where should I keep my medicine? This drug is given in a hospital or clinic and will not be stored at home. NOTE: This sheet is a summary. It may not cover all possible information. If you have questions about this medicine, talk to your doctor, pharmacist, or health care provider.  2018 [employer]/Gold Standard (2015-03-11 12:11:06)

## 2017-06-23 NOTE — Patient Instructions (Signed)
[facility] Discharge Instructions for Patients Receiving Chemotherapy  Today you received the following chemotherapy agents Herceptin  To help prevent nausea and vomiting after your treatment, we encourage you to take your nausea medication as directed   If you develop nausea and vomiting that is not controlled by your nausea medication, call the clinic.   BELOW ARE SYMPTOMS THAT SHOULD BE REPORTED IMMEDIATELY:  *FEVER GREATER THAN 100.5 F  *CHILLS WITH OR WITHOUT FEVER  NAUSEA AND VOMITING THAT IS NOT CONTROLLED WITH YOUR NAUSEA MEDICATION  *UNUSUAL SHORTNESS OF BREATH  *UNUSUAL BRUISING OR BLEEDING  TENDERNESS IN MOUTH AND THROAT WITH OR WITHOUT PRESENCE OF ULCERS  *URINARY PROBLEMS  *BOWEL PROBLEMS  UNUSUAL RASH Items with * indicate a potential emergency and should be followed up as soon as possible.  Feel free to call the clinic should you have any questions or concerns. The clinic phone number is [phone].  Please show the CHEMO ALERT CARD at check-in to the Emergency Department and triage nurse.   

## 2017-06-23 NOTE — Progress Notes (Addendum)
[place]  [phone] [phone]  Clinic Follow up Note   Patient Care Team: Manon Hilding, MD as PCP - General (Cardiology) Truitt Merle, MD as Consulting Physician (Hematology) Excell Seltzer, MD as Consulting Physician (General Surgery) Alla Feeling, NP as Nurse Practitioner (Nurse Practitioner) [name], MD as Attending Physician (Cardiology) 06/23/2017  SUMMARY OF ONCOLOGIC HISTORY: Oncology History   Cancer Staging Carcinoma of upper-inner quadrant of left breast in female, estrogen receptor positive ([place]) Staging form: Breast, AJCC 8th Edition - Clinical stage from 01/18/2017: Stage IB (cT2, cN0, cM0, G3, ER: Positive, PR: Positive, HER2: Positive) - Signed by Truitt Merle, MD on 02/10/2017 - Pathologic stage from 02/23/2017: Stage IB (pT3, pN0, cM0, G3, ER: Positive, PR: Positive, HER2: Positive) - Signed by Alla Feeling, NP on 03/16/2017  Carcinoma of upper-outer quadrant of left breast in female, estrogen receptor positive ([place]) Staging form: Breast, AJCC 8th Edition - Clinical stage from 01/18/2017: Stage IB (cT2, cN0, cM0, G2, ER: Positive, PR: Positive, HER2: Negative) - Unsigned - Pathologic stage from 02/23/2017: Stage IB (pT2, pN0(sn), cM0, G3, ER: Positive, PR: Positive, HER2: Negative) - Signed by Alla Feeling, NP on 03/16/2017       Carcinoma of upper-outer quadrant of left breast in female, estrogen receptor positive ([place])   01/13/2017 Mammogram    FINDINGS: In the left breast, a spiculated mass lies in the upper outer quadrant. In the medial posterior left breast, there is a larger lobulated mass. These masses correspond to the palpable abnormalities. No other discrete left breast masses.  In the right breast, there are no discrete masses. There are no areas of architectural distortion.  In both breasts there are scattered calcifications, rounded punctate, with a few other larger coarse dystrophic  calcifications, without significant change from the previous screening mammogram, which is dated 12/04/2009.  Mammographic images were processed with CAD.  On physical exam, there is a firm palpable mass in the upper outer left breast and another firm mass in the medial left breast.       01/13/2017 Breast US    Targeted ultrasound is performed, showing an irregular hypoechoic shadowing mass in the left breast at the 2:30 o'clock position, 5 cm the nipple, measuring 3.3 x 2.8 x 2.6 cm. In the 10:30 o'clock position of the left breast, 5 cm the nipple, there is irregular hypoechoic mass with somewhat more lobulated margins, corresponding to the lobulated mass seen mammographically. This measures 4.8 x 2.9 x 4.8 cm. Both masses show internal vascularity on color Doppler analysis.  Sonographic evaluation of the left axilla shows several nodes with thickened cortices. Status cortex measured is 5 mm. None of these lymph nodes appear enlarged but overall size criteria.  IMPRESSION: 1. Two masses in the left breast, 1 at the 2:30 o'clock position in the other at the 10:30 o'clock position, both highly suspicious for breast carcinoma. 2. Several abnormal left axillary lymph nodes with thickened cortices. 3. No evidence of right breast malignancy.       01/18/2017 Pathology Results    Diagnosis 1. Breast, left, needle core biopsy, 2:30 o'clock - INVASIVE MAMMARY CARCINOMA - MAMMARY CARCINOMA IN-SITU - SEE COMMENT 2. Lymph node, needle/core biopsy, left axilla - NO CARCINOMA IDENTIFIED - SEE COMMENT 3. Breast, left, needle core biopsy, 10:30 o'clock - INVASIVE DUCTAL CARCINOMA - SEE COMMENT  1. PROGNOSTIC INDICATORS Results: IMMUNOHISTOCHEMICAL AND MORPHOMETRIC ANALYSIS PERFORMED MANUALLY Estrogen Receptor: 100%, POSITIVE, STRONG STAINING INTENSITY Progesterone Receptor: 60%, POSITIVE,  STRONG STAINING INTENSITY Proliferation Marker Ki67: 60%  1. FLUORESCENCE IN-SITU  HYBRIDIZATION Results: HER2 - NEGATIVE RATIO OF HER2/CEP17 SIGNALS 1.28 AVERAGE HER2 COPY NUMBER PER CELL 2.75  3. PROGNOSTIC INDICATORS Results: IMMUNOHISTOCHEMICAL AND MORPHOMETRIC ANALYSIS PERFORMED MANUALLY Estrogen Receptor: 100%, POSITIVE, STRONG STAINING INTENSITY Progesterone Receptor: 50%, POSITIVE, STRONG STAINING INTENSITY Proliferation Marker Ki67: 70%  3. FLUORESCENCE IN-SITU HYBRIDIZATION Results: HER2 - **POSITIVE** RATIO OF HER2/CEP17 SIGNALS 2.80 AVERAGE HER2 COPY NUMBER PER CELL 6.15  Microscopic Comment 1. The biopsy material shows an infiltrative proliferation of cells with large vesicular nuclei with inconspicuous nucleoli, arranged linearly and in small clusters. Based on the biopsy, the carcinoma appears Nottingham grade 2 of 3 and measures 0.9 cm in greatest linear extent. E-cadherin and prognostic markers (ER/PR/ki-67/HER2-FISH)are pending and will be reported in an addendum. Dr. Lyndon Code reviewed the case and agrees with the above diagnosis.      02/10/2017 Initial Diagnosis    Carcinoma of upper-outer quadrant of left breast in female, estrogen receptor positive ([place])      02/19/2017 Imaging    Bone Scan whole Body 02/19/17 IMPRESSION: Negative for evidence of bony metastatic disease.      02/19/2017 Imaging    CT CAP W Contrast 02/19/17 IMPRESSION: 1. Small right middle lobe pulmonary nodules up to 0.8 cm, nonspecific. Non-contrast chest CT at 3-6 months is recommended. If the nodules are stable at time of repeat CT, then future CT at 18-24 months (from today's scan) is considered optional for low-risk patients, but is recommended for high-risk patients. This recommendation follows the consensus statement: Guidelines for Management of Incidental Pulmonary Nodules Detected on CT Images: From the Fleischner Society 2017; Radiology 2017; 284:228-243. 2. Subcentimeter hepatic lesions likely cysts but incompletely characterized due to small size. Attention  on follow-up imaging recommended. 3. 2.1 cm fusiform right common iliac artery aneurysm. Continued surveillance recommended. 4. Aortic Atherosclerosis (ICD10-I70.0) and Emphysema (ICD10-J43.9).      02/23/2017 Pathology Results    Diagnosis 1. Breast, simple mastectomy, Left Total - INVASIVE DUCTAL CARCINOMA, MULTIFOCAL, NOTTINGHAM GRADE 3/3 (5.3 CM, 3.5 CM) - INVASIVE CARCINOMA INVOLVES THE DERMIS - DUCTAL CARCINOMA IN SITU, INTERMEDIATE GRADE - HYALINIZED FIBROADENOMA - MARGINS UNINVOLVED BY CARCINOMA - NO CARCINOMA IDENTIFIED IN TWO LYMPH NODES (0/2) - SEE ONCOLOGY TABLE AND COMMENT BELOW 2. Lymph node, sentinel, biopsy, Left Axillary - NO CARCINOMA IDENTIFIED IN ONE LYMPH NODE (0/1) 3. Lymph node, sentinel, biopsy - NO CARCINOMA IDENTIFIED IN ONE LYMPH NODE (0/1) 4. Lymph node, sentinel, biopsy - NO CARCINOMA IDENTIFIED IN ONE LYMPH NODE (0/1) 5. Lymph node, sentinel, biopsy - NO CARCINOMA IDENTIFIED IN ONE LYMPH NODE (0/1) Microscopic Comment 1. BREAST, INVASIVE TUMOR Procedure: Simple mastectomy Laterality: Left Tumor Size: 5. 3 cm, 3.5 cm Histologic Type: Invasive carcinoma of no special type (ductal, not otherwise specified) Grade: Nottingham Grade 3 Tubular Differentiation: 3 Nuclear Pleomorphism: 3 Mitotic Count: 2 Ductal Carcinoma in Situ (DCIS): Present Extent of Tumor: Skin: Invasive carcinoma directly invades into the dermis or epidermis without skin ulceration Margins: Invasive carcinoma, distance from closest margin: 1.5 cm (posterior) DCIS, distance from closest margin: > 1 cm Regional Lymph Nodes: Number of Lymph Nodes Examined: 6 Number of Sentinel Lymph Nodes Examined: 4 Lymph Nodes with Macrometastases: 0 Lymph Nodes with Micrometastases: 0 Lymph Nodes with Isolated Tumor Cells: 0 Treatment effect: No known presurgical therapy Breast Prognostic Profile: See Also ([id number]) Estrogen Receptor: Positive (100%, strong); Positive (100%,  strong) Progesterone Receptor: Positive (50%, strong); Positive (50%, strong) Her2: Positive (Ratio 2.80);  Negative (Ratio 1.28) Ki-67: 70%; 60% Best tumor block for sendout testing: 1E Pathologic Stage Classification (pTNM, AJCC 8th Edition): Primary Tumor: mpT3 Regional Lymph Nodes: pN0 COMMENT: The two invasive carcinomas in the breast have slightly different morphologies. The larger lesion has a papillary and micropapillary pattern admixed with typical ductal carcinoma while the smaller lesion is more typical of a ductal lesion with linear arrays (E-cadherin performed on biopsy). There are foci within the larger lesion that are concerning for lymphovascular space invasion.       04/07/2017 -  Chemotherapy    weekly taxol and herceptin x12 weeks then herceptin q3 weeks for total 1 year      CURRENT THERAPY:  weekly taxol and Herceptin x12 weeks beginning 04/07/17; taxol dose-reduced for worsening peripheral neuropathy; followed by maintenance Herceptin for total 1 year   INTERVAL HISTORY: Ms. Sigal returns with her husband for follow up and cycle 11 (of 12) weekly taxol. She has low energy, but continues activities as usual. 1 week ago she developed urinary frequency, dysuria, and urge incontinence on standing. She noticed 1 episode of hematuria and a few other episodes of pink-tinged urine. She has treated with increasing water intake. Denies fever, chills, back, or pelvic pain.  She reports her peripheral neuropathy is worse in her feet, causing balance alteration without fall, and hands leading to difficulty buttoning blouse.  She uses cryotherapy to hands and feet during infusions without much benefit. [name] previously dose-reduced taxol for last 2 cycles. No significant improvement.   REVIEW OF SYSTEMS:   Constitutional: Denies fevers, chills or abnormal weight loss (+) fatigue Eyes: Denies blurriness of vision Ears, nose, mouth, throat, and face: Denies mucositis or sore  throat Respiratory: Denies cough, dyspnea or wheezes Cardiovascular: Denies palpitation, chest discomfort or lower extremity swelling Gastrointestinal:  Denies nausea, vomiting, constipation, diarrhea, heartburn or change in bowel habits GU: (+) 1 week history of dysuria, frequency, urge incontinence and one episode of hematuria Skin: Denies abnormal skin rashes Lymphatics: (+) Left upper extremity lymphedema, wears sleeve and glove Neurological:Denies numbness, tingling or new weaknesses (+) increased neuropathy to hands and feet with balance alteration and difficulty with hands Behavioral/Psych: Mood is stable, no new changes  All other systems were reviewed with the patient and are negative.  MEDICAL HISTORY:  Past Medical History:  Diagnosis Date  . Arthritis    "maybe a little in my left knee" (02/23/2017)  . Asthmatic bronchitis   . Cancer of left breast ([place])   . Coronary atherosclerosis    Minor nonobstructive CAD at cardiac catheterization 2009  . Dyslipidemia   . Femoral bruit 7/08   With normal ABIs  . Hypertension   . Lower extremity edema    Chronic  . Pneumonia ~ 1950   in 8th grade  . Type II diabetes mellitus ([place])     SURGICAL HISTORY: Past Surgical History:  Procedure Laterality Date  . APPENDECTOMY    . BREAST BIOPSY Left 02/2017  . CARDIAC CATHETERIZATION     "long time ago" (02/23/2017)  . MASTECTOMY COMPLETE / SIMPLE W/ SENTINEL NODE BIOPSY Left 02/23/2017   TOTAL MATECTOMY;  LEFT AXILLARY SENTINEL LYMPH NODE BIOPSY ERAS PATHWAY  . MASTECTOMY WITH RADIOACTIVE SEED GUIDED EXCISION AND AXILLARY SENTINEL LYMPH NODE BIOPSY Left 02/23/2017   Procedure: LEFT TOTAL MATECTOMY;  LEFT AXILLARY SENTINEL LYMPH NODE BIOPSY ERAS PATHWAY;  Surgeon: Excell Seltzer, MD;  Location: [facility];  Service: General;  Laterality: Left;  . PILONIDAL CYST EXCISION    .  PORTA CATH INSERTION  02/23/2017  . PORTACATH PLACEMENT N/A 02/23/2017   Procedure: INSERTION PORT-A-CATH;   Surgeon: Excell Seltzer, MD;  Location: [facility];  Service: General;  Laterality: N/A;  . SHOULDER ARTHROSCOPY W/ ROTATOR CUFF REPAIR Right     I have reviewed the social history and family history with the patient and they are unchanged from previous note.  ALLERGIES:  has No Known Allergies.  MEDICATIONS:  Current Outpatient Medications  Medication Sig Dispense Refill  . aspirin 81 MG EC tablet Take 81 mg by mouth at bedtime.     . chlorthalidone (HYGROTON) 25 MG tablet Take 25 mg by mouth daily.     . Coenzyme Q10 (COQ10) 100 MG CAPS Take 100 mg by mouth daily.      Marland Kitchen latanoprost (XALATAN) 0.005 % ophthalmic solution Place 1 drop into both eyes at bedtime.    Marland Kitchen losartan (COZAAR) 50 MG tablet Take 50 mg by mouth daily.    . metFORMIN (GLUCOPHAGE) 500 MG tablet Take 1,000 mg by mouth daily with breakfast.     . potassium chloride SA (K-DUR,KLOR-CON) 20 MEQ tablet Take 1 tablet (20 mEq total) by mouth daily. 30 tablet 0  . vitamin B-12 (CYANOCOBALAMIN) 1000 MCG tablet Take 1,000 mcg by mouth daily.    . ciprofloxacin (CIPRO) 250 MG tablet Take 1 tablet (250 mg total) by mouth 2 (two) times daily. 6 tablet 0  . lidocaine-prilocaine (EMLA) cream Apply to affected area once 30 g 3  . ondansetron (ZOFRAN) 8 MG tablet Take 1 tablet (8 mg total) by mouth 2 (two) times daily as needed (Nausea or vomiting). 30 tablet 1  . prochlorperazine (COMPAZINE) 10 MG tablet Take 1 tablet (10 mg total) by mouth every 6 (six) hours as needed (Nausea or vomiting). 30 tablet 1  . traMADol (ULTRAM) 50 MG tablet Take 1 tablet (50 mg total) by mouth every 6 (six) hours as needed (mild pain). 15 tablet 0   No current facility-administered medications for this visit.    Facility-Administered Medications Ordered in Other Visits  Medication Dose Route Frequency Provider Last Rate Last Dose  . sodium chloride flush (NS) 0.9 % injection 10 mL  10 mL Intracatheter PRN Truitt Merle, MD   10 mL at 06/23/17 1445     PHYSICAL EXAMINATION: ECOG PERFORMANCE STATUS: 2 - Symptomatic, <50% confined to bed  Vitals:   06/23/17 1147  BP: (!) 172/69  Pulse: 74  Resp: 18  Temp: 98.6 F (37 C)  SpO2: 98%   Filed Weights   06/23/17 1147  Weight: 155 lb 1.6 oz (70.4 kg)    GENERAL:alert, no distress and comfortable SKIN: no rashes or significant lesions EYES: normal, Conjunctiva are pink and non-injected, sclera clear OROPHARYNX:no exudate, no erythema and lips, buccal mucosa, and tongue normal  LYMPH:  no palpable cervical or supraclavicular  LUNGS: clear to auscultation with normal breathing effort HEART: regular rate & rhythm and no murmurs and no lower extremity edema ABDOMEN:abdomen soft, non-tender and normal bowel sounds. No suprapubic tenderness or CVAT Musculoskeletal:no cyanosis of digits and no clubbing  NEURO: alert & oriented x 3 with fluent speech, no focal motor deficits.  Moderately decreased peripheral vibratory sense bilaterally per tuning fork exam  Breasts: (+) s/p left mastectomy, surgical incision is well healed. No abnormalities along the incision or to chest wall. No palpable mass in right breast or either axilla that I could appreciate.  PAC without erythema   LABORATORY DATA:  I have reviewed  the data as listed CBC Latest Ref Rng & Units 06/23/2017 06/16/2017 06/09/2017  WBC 3.9 - 10.3 K/uL 3.6(L) 3.8(L) 3.1(L)  Hemoglobin 11.6 - 15.9 g/dL 10.0(L) 9.9(L) 9.4(L)  Hematocrit 34.8 - 46.6 % 29.7(L) 29.3(L) 28.1(L)  Platelets 145 - 400 K/uL 297 316 341     CMP Latest Ref Rng & Units 06/23/2017 06/16/2017 06/09/2017  Glucose 70 - 140 mg/dL 75 133 97  BUN 7 - 26 mg/dL 11 12 8   Creatinine 0.60 - 1.10 mg/dL 0.69 0.77 0.71  Sodium 136 - 145 mmol/L 131(L) 129(L) 129(L)  Potassium 3.5 - 5.1 mmol/L 4.0 3.5 3.7  Chloride 98 - 109 mmol/L 97(L) 95(L) 97(L)  CO2 22 - 29 mmol/L 26 28 28   Calcium 8.4 - 10.4 mg/dL 9.3 9.2 9.2  Total Protein 6.4 - 8.3 g/dL 6.5 6.4 6.2(L)  Total Bilirubin  0.2 - 1.2 mg/dL 0.5 0.5 0.3  Alkaline Phos 40 - 150 U/L 52 50 52  AST 5 - 34 U/L 18 15 17   ALT 0 - 55 U/L 17 15 14    PATHOLOGY  LEFT SIMPLE MASTECTOMY WITH SNLB 02/23/17 per [name] Diagnosis 1. Breast, simple mastectomy, Left Total - INVASIVE DUCTAL CARCINOMA, MULTIFOCAL, NOTTINGHAM GRADE 3/3 (5.3 CM, 3.5 CM) - INVASIVE CARCINOMA INVOLVES THE DERMIS - DUCTAL CARCINOMA IN SITU, INTERMEDIATE GRADE - HYALINIZED FIBROADENOMA - MARGINS UNINVOLVED BY CARCINOMA - NO CARCINOMA IDENTIFIED IN TWO LYMPH NODES (0/2) - SEE ONCOLOGY TABLE AND COMMENT BELOW 2. Lymph node, sentinel, biopsy, Left Axillary - NO CARCINOMA IDENTIFIED IN ONE LYMPH NODE (0/1) 3. Lymph node, sentinel, biopsy - NO CARCINOMA IDENTIFIED IN ONE LYMPH NODE (0/1) 4. Lymph node, sentinel, biopsy - NO CARCINOMA IDENTIFIED IN ONE LYMPH NODE (0/1) 5. Lymph node, sentinel, biopsy - NO CARCINOMA IDENTIFIED IN ONE LYMPH NODE (0/1) Microscopic Comment 1. BREAST, INVASIVE TUMOR Procedure: Simple mastectomy Laterality: Left Tumor Size: 5. 3 cm, 3.5 cm Histologic Type: Invasive carcinoma of no special type (ductal, not otherwise specified) Grade: Nottingham Grade 3 Tubular Differentiation: 3 Nuclear Pleomorphism: 3 Mitotic Count: 2 Ductal Carcinoma in Situ (DCIS): Present Extent of Tumor: Skin: Invasive carcinoma directly invades into the dermis or epidermis without skin ulceration Margins: Invasive carcinoma, distance from closest margin: 1.5 cm (posterior) DCIS, distance from closest margin: > 1 cm Regional Lymph Nodes: Number of Lymph Nodes Examined: 6 Number of Sentinel Lymph Nodes Examined: 4 Lymph Nodes with Macrometastases: 0 Lymph Nodes with Micrometastases: 0 Lymph Nodes with Isolated Tumor Cells: 0 Treatment effect: No known presurgical therapy Breast Prognostic Profile: See Also ([id number]) Estrogen Receptor: Positive (100%, strong); Positive (100%, strong) Progesterone Receptor: Positive (50%,  strong); Positive (50%, strong) Her2: Positive (Ratio 2.80); Negative (Ratio 1.28) Ki-67: 70%; 60% Best tumor block for sendout testing: 1E Pathologic Stage Classification (pTNM, AJCC 8th Edition): Primary Tumor: mpT3 Regional Lymph Nodes: pN0 COMMENT: The two invasive carcinomas in the breast have slightly different morphologies. The larger lesion has a papillary and micropapillary pattern admixed with typical ductal carcinoma while the smaller lesion is more typical of a ductal lesion with linear arrays (E-cadherin performed on biopsy). There are foci within the larger lesion that are concerning for lymphovascular space invasion.   02/17/17 ECHO: Study Conclusions - Left ventricle: The cavity size was normal. Wall thickness was increased in a pattern of mild LVH. Systolic function was normal. The estimated ejection fraction was in the range of 55% to 60%. Normal GLS at -20%. Wall motion was normal; there were no regional wall motion abnormalities. Doppler  parameters are consistent with abnormal left ventricular relaxation (grade 1 diastolic dysfunction). The E/e&' ratio is between 8-15, suggesting indeterminate LV filling pressure. - Aortic valve: Sclerosis without stenosis. - Aorta: Mildly dilated aorta. Aortic root dimension: 39 mm (ED). Ascending aortic diameter: 38 mm (S). - Left atrium: The atrium was normal in size. - Inferior vena cava: The vessel was normal in size. The respirophasic diameter changes were in the normal range (>= 50%), consistent with normal central venous pressure. Impressions: - Compared to a prior study in 2013, the LVEF is slightly lower but normal at 55-60%. There is mild LVH, normal GLS at -20%, grade 1 DD and indeterminate LV filling pressure. The aorta is mildly dilated at 3.8-3.9 cm.     RADIOGRAPHIC STUDIES: I have personally reviewed the radiological images as listed and agreed with the findings in the report. No  results found.   ASSESSMENT & PLAN: Shawnika Pepin is an 84 year old with past medical history positive for HTN, DM, CAD now with newly diagnosed left breast cancer  1.Multifocal invasive ductal carcinoma of the left breast of female;carcinoma of the upper-outer quadrant of left breast and female, ER/PR positive, HER-2 negative pT2N0M0, G2, Stage 1B; IDC of the upper-inner quadrant left breast, ER+/PR+/HER2+, pT3N0M0 G3, stage IB 2. HTN, CAD, DM 3. Skin rash 4. Hyponatremia 5. Peripheral neuropathy, G1; secondary to taxol and DM  Ms. Poehler appears stable.  She completed 10 cycles of weeklyTaxol and Herceptin.  She tolerates treatment moderately well except worsening peripheral neuropathy.  She has developed gait imbalance and difficulty using hands simple tasks.  Since your visit with Dr. Morey Hummingbird who recommends to discontinue Taxol and continue with Herceptin every 3 weeks.  Will discuss with PT and ask they incorporate balance exercises with she is therefore lymphedema.  She will begin B complex vitamin.  If she has worsening pain or increased symptoms will start Neurontin or Cymbalta.  We recommend she use a cane as needed for ambulation and use caution when walking at home at night and especially in the dark.   [name] discussed with her about upcoming plan of care.  Given T3, grade 3 disease on surgical path, she might be eligible to receive adjuvant radiation to the left chest wall/axilla to further reduce her risk of recurrence.  We will refer her to rad onc today.  She has ER/PR positive disease and is a candidate for adjuvant endocrine therapy with AI.  Dr. Morey Hummingbird discussed anastrozole and common side effects.  I gave her printed materials to read today.  Likely will begin in a few weeks after she recovers more from chemotherapy.  [name] also discussed the increased survival benefit of Kadcyla versus Herceptin.  Will submit for insurance approval and if approved plan to start in 3 weeks.   Plan to continue for a total of 1 year, same as Herceptin.  The patient agrees.  Labs reviewed, CMP and CBC are adequate for treatment.  Potassium has remained normal, she can discontinue oral potassium.  Sodium is improved. UA reveals pyuria.  Given her symptoms and UA will treat with Cipro 1 tab twice daily x3 days.  I reviewed side effects and prescribed to her pharmacy today.  Urine culture is pending.  Proceed with Herceptin only today, increased dose to reflect every 3 week dosing.  Return in 3 weeks for follow-up and next cycle Herceptin (Kadcyla if insurance approves).   PLAN -UA and culture for dysuria and hematuria, will start empiric abx -Discontinue taxol  for worsening peripheral neuropathy -continue herceptin, increase dose today and continue q3 weeks -Continue PT for lymphedema; send message to therapists to incorporate balance exercises for neuropathy  -Start B complex vitamin for neuropathy, will consider neurontin or cymbalta if neuropathy does not improve with B vitamin and stopping taxol  -Printed information on AI and Kadcyla; will submit kadcyla for insurance approval. If approved, begin in 3 weeks -Referral to RT to discuss adjuvant radiation    Orders Placed This Encounter  Procedures  . Urine Culture    Standing Status:   Future    Number of Occurrences:   1    Standing Expiration Date:   06/23/2018  . Urinalysis, Complete w Microscopic    Standing Status:   Future    Number of Occurrences:   1    Standing Expiration Date:   06/24/2018  . Ambulatory referral to Radiation Oncology    Referral Priority:   Routine    Referral Type:   Consultation    Referral Reason:   Specialty Services Required    Requested Specialty:   Radiation Oncology    Number of Visits Requested:   1   All questions were answered. The patient knows to call the clinic with any problems, questions or concerns. No barriers to learning was detected. I spent 20 minutes counseling the patient face  to face. The total time spent in the appointment was 35 minutes and more than 50% was on counseling and review of test results     Alla Feeling, NP 06/23/17   Addendum  I have seen the patient, examined her. I agree with the assessment and and plan and have edited the notes.   Mrs Blakley is overall doing well, but her peripheral neuropathy has slightly gotten worse, with mild hand function and balance issue.  I recommended to cancel her last 2 weeks of Taxol.  We will continue Herceptin, and starting maintenance therapy 35m/kg from today. We also discussed the role of T-DM1. Although she did not have neoadjuvant chemo, given her T3 disease, and adjuvant chemo with Taxol alone, I do think she would benefit more from T-DM1 than herceptin as maintenance therapy.  Potential benefits and side effects were discussed with her, she agrees to proceed if her insurance approves.  I will also refer her to radiation oncology to discuss adjuvant radiation, giving her T3 high risk disease.  Given her strong ER and PR positive disease, I plan to start her on antiestrogen therapy anastrozole on her next visit.  Benefit and potential side effects discussed with her, she is interested.  Will see her back in 3 weeks.  YTruitt Merle 06/23/2017

## 2017-06-23 NOTE — Progress Notes (Signed)
Pt has an echo scheduled for June 6th 2019.

## 2017-06-24 ENCOUNTER — Telehealth: Payer: Self-pay | Admitting: Hematology

## 2017-06-24 NOTE — Telephone Encounter (Signed)
Appointments scheduled letter/calendar mailed to patient per 5/22 los/ 5/23 sch msg

## 2017-06-25 ENCOUNTER — Ambulatory Visit: Payer: Medicare Other | Admitting: Physical Therapy

## 2017-06-25 ENCOUNTER — Telehealth: Payer: Self-pay

## 2017-06-25 DIAGNOSIS — R293 Abnormal posture: Secondary | ICD-10-CM

## 2017-06-25 DIAGNOSIS — R2681 Unsteadiness on feet: Secondary | ICD-10-CM

## 2017-06-25 DIAGNOSIS — M25612 Stiffness of left shoulder, not elsewhere classified: Secondary | ICD-10-CM | POA: Diagnosis not present

## 2017-06-25 DIAGNOSIS — Z483 Aftercare following surgery for neoplasm: Secondary | ICD-10-CM | POA: Diagnosis not present

## 2017-06-25 LAB — URINE CULTURE: Culture: 40000 — AB

## 2017-06-25 NOTE — Therapy (Addendum)
Progress Note Reporting Period 04/30/17 to 06/25/17  See note below for Objective Data and Assessment of Progress/Goals.       [place], [place], [address] Phone: [phone]   Fax:  [phone]  Physical Therapy Treatment  Patient Details  Name: Penny Huynh MRN: [id number] Date of Birth: 06/16/1933 Referring Provider: [name]   Encounter Date: 06/25/2017  PT End of Session - 06/25/17 1224    Visit Number  20    Number of Visits  27    Date for PT Re-Evaluation  07/23/17    PT Start Time  0929    PT Stop Time  1020    PT Time Calculation (min)  51 min    Activity Tolerance  Patient tolerated treatment well    Behavior During Therapy  [facility] for tasks assessed/performed       Past Medical History:  Diagnosis Date  . Arthritis    "maybe a little in my left knee" (02/23/2017)  . Asthmatic bronchitis   . Cancer of left breast ([place])   . Coronary atherosclerosis    Minor nonobstructive CAD at cardiac catheterization 2009  . Dyslipidemia   . Femoral bruit 7/08   With normal ABIs  . Hypertension   . Lower extremity edema    Chronic  . Pneumonia ~ 1950   in 8th grade  . Type II diabetes mellitus ([place])     Past Surgical History:  Procedure Laterality Date  . APPENDECTOMY    . BREAST BIOPSY Left 02/2017  . CARDIAC CATHETERIZATION     "long time ago" (02/23/2017)  . MASTECTOMY COMPLETE / SIMPLE W/ SENTINEL NODE BIOPSY Left 02/23/2017   TOTAL MATECTOMY;  LEFT AXILLARY SENTINEL LYMPH NODE BIOPSY ERAS PATHWAY  . MASTECTOMY WITH RADIOACTIVE SEED GUIDED EXCISION AND AXILLARY SENTINEL LYMPH NODE BIOPSY Left 02/23/2017   Procedure: LEFT TOTAL MATECTOMY;  LEFT AXILLARY SENTINEL LYMPH NODE BIOPSY ERAS PATHWAY;  Surgeon: Excell Seltzer, MD;  Location: [facility];  Service: General;  Laterality: Left;  . PILONIDAL CYST EXCISION    . PORTA CATH INSERTION  02/23/2017  . PORTACATH  PLACEMENT N/A 02/23/2017   Procedure: INSERTION PORT-A-CATH;  Surgeon: Excell Seltzer, MD;  Location: [facility];  Service: General;  Laterality: N/A;  . SHOULDER ARTHROSCOPY W/ ROTATOR CUFF REPAIR Right     There were no vitals filed for this visit.  Subjective Assessment - 06/25/17 0933    Subjective  "They told me to use the cane. They want to make sure I don't fall." "They're not doing any more chemo because of the neuropathy. My fingers are starting to act up too." Went to [employer] retreat earlier this week and really enjoyed it.    Pertinent History  Patient underwent a left mastectomy and sentinel node biopsy (6 negative nodes) on 02/23/17. She will begin chemotherapy 03/31/17 as long as drain is removed today. She has 2 different breast cancers. Both are ER/PR positive but one is HER2 negative and one is HER2 positive. History of right shoulder scope 20 years ago and has diabetes and hypertension, both of which are well controlled.    Currently in Pain?  Yes    Pain Score  8     Pain Location  Foot    Pain Orientation  Right;Left    Aggravating Factors   chemo    Pain Relieving Factors  nothing  [place] Adult PT Treatment/Exercise - 06/25/17 0001      Neuro Re-ed    Neuro Re-ed Details   standing with back in corner: tandem stance x 30 seconds each with each foot forward; single leg stance on each foot x 2 holds; standing on Airex foam pad with feet close together, eyes open first, then eyes closed      Knee/Hip Exercises: Standing   Hip Abduction  AROM;Right;Left;10 reps;Knee straight without hand support, but in front of elevated mat, for bala    Hip Extension  AROM;Right;Left;10 reps;Knee straight in front of elevated mat but no UE support, for balance    Other Standing Knee Exercises  side step with mini squat x 20 feet each direction    Other Standing Knee Exercises  braiding x 20 feet each direction      Manual Therapy   Myofascial Release   unidirectional pull at left pect insertion area, crosshands across left axilla left pect tendons are tight and sore today    Manual Lymphatic Drainage (MLD)  Briefly to Lt lateral trunk: Short neck, 5 diaphragmatic breaths, Lt inguinal nodes, and Lt axillo-inguinal anastomosis focusing at area of swelling; also right axila and anterior interaxillary anastomosis and left upper arm to elbow.    Passive ROM  in supine to left shoulder for er, flexion, and abduction, prolonged stretches             PT Education - 06/25/17 1224    Education provided  Yes    Education Details  tandem stance and single leg stance, standing with back to a corner, for balance    Person(s) Educated  Patient    Methods  Explanation;Verbal cues;Handout    Comprehension  Verbalized understanding;Returned demonstration          PT Long Term Goals - 06/25/17 1227      Additional Long Term Goals   Additional Long Term Goals  Yes      PT LONG TERM GOAL #7   Title  Pt. will be independent with home exercise for balance.    Status  New      Breast Clinic Goals - 02/08/17 2024      Patient will be able to verbalize understanding of pertinent lymphedema risk reduction practices relevant to her diagnosis specifically related to skin care.   Status  Achieved      Patient will be able to return demonstrate and/or verbalize understanding of the post-op home exercise program related to regaining shoulder range of motion.   Status  Achieved      Patient will be able to verbalize understanding of the importance of attending the postoperative After Breast Cancer Class for further lymphedema risk reduction education and therapeutic exercise.   Status  Achieved           Plan - 06/25/17 1225    Clinical Impression Statement  Added a number of balance exercises to her session today, and added two to her home exercise program, per request of NP and the patient. She came in using her cane today.     Rehab  Potential  Excellent    PT Frequency  2x / week    PT Duration  4 weeks    PT Treatment/Interventions  ADLs/Self Care Home Management;Therapeutic exercise;Patient/family education;Orthotic Fit/Training;Manual techniques;Passive range of motion;Manual lymph drainage;Scar mobilization;DME Instruction    PT Next Visit Plan  Continue to include balance exercises; consider another balance assessment (besides Berg, on which she did well), such as  DGI. Continue manual work on left chest.     PT Home Exercise Plan  focus on stretches to try to release cording; supine scapular series  Pt. returned to her previous exercise program at her church as of 05/17/17; horizontal abduction stretch in supine; add balance exercises    Consulted and Agree with Plan of Care  Patient       Patient will benefit from skilled therapeutic intervention in order to improve the following deficits and impairments:  Postural dysfunction, Decreased knowledge of precautions, Decreased knowledge of use of DME, Impaired UE functional use, Decreased range of motion, Decreased strength, Increased fascial restricitons, Decreased scar mobility  Visit Diagnosis: Aftercare following surgery for neoplasm  Stiffness of left shoulder, not elsewhere classified  Abnormal posture  Unsteadiness on feet     Problem List Patient Active Problem List   Diagnosis Date Noted  . Port-A-Cath in place 04/14/2017  . Stage II breast cancer, left ([place]) 02/23/2017  . Carcinoma of upper-outer quadrant of left breast in female, estrogen receptor positive ([place]) 02/10/2017  . Carcinoma of upper-inner quadrant of left breast in female, estrogen receptor positive ([place]) 02/10/2017  . Chest pain 07/18/2015  . Hypokalemia 07/18/2015  . Vertigo 07/18/2015  . LBBB (left bundle branch block) 05/28/2009  . AODM 12/21/2007  . Essential hypertension, benign 12/21/2007  . DIASTOLIC DYSFUNCTION 27/80/0447  . FEMORAL BRUIT, RIGHT 12/21/2007  . DYSPNEA  12/21/2007    Geron Mulford 06/25/2017, 12:29 PM  [place], [place], [address] Phone: [phone]   Fax:  [phone]  Name: Penny Huynh MRN: [id number] Date of Birth: 11/13/1933  Serafina Royals, PT 06/25/17 12:30 PM

## 2017-06-25 NOTE — Telephone Encounter (Signed)
Patient started ABT yesterday. Is feeling some better today and bought cranberry juice. Encouraged fluids and encouraged patient to call center if symptoms do not subside or worsen.  Patient voiced understanding.

## 2017-06-25 NOTE — Patient Instructions (Signed)
Tandem Stance    Stand with your back in a corner so that you can catch yourself if you get off balance.  Right foot in front of left, heel touching toe both feet "straight ahead". Stand on Foot Triangle of Support with both feet. Balance in this position _30__ seconds. Do with left foot in front of right.  Copyright  VHI. All rights reserved.    POSITION: Single Leg Balance: Neck Rotated / Stance Side    Again, do this with your back in a corner to be able to catch yourself.  Stand on right leg. Hold _10-30__ seconds. _2__ reps _2__ times per day.  Do the same with left leg. http://ggbe.exer.us/19   Copyright  VHI. All rights reserved.

## 2017-06-30 ENCOUNTER — Other Ambulatory Visit: Payer: Medicare Other

## 2017-06-30 ENCOUNTER — Telehealth: Payer: Self-pay | Admitting: Radiation Oncology

## 2017-06-30 NOTE — Telephone Encounter (Signed)
I called the patient to offer her a sooner appointment. She will come tomorrow for consultation.

## 2017-06-30 NOTE — Progress Notes (Signed)
Location of Breast Cancer:Carcinoma of upper inner quadrant of left breast ER +   Did patient present with symptoms (if so, please note symptoms) or was this found on screening mammography?:   Mammogram 01/13/2017: left breast, spiculated mass lies in the upper outer quadrant.  In the medial posterior left breast there is a larger lobulated mass.  These masses correspond to the palpable abnormalities.  In both breast there are scattered calcifications, rounded punctate with a few other larger coarse dystrophic calcifications without significant change from the previous screening mammogram.  Breast US 01/13/2017: targeted ultrasound is performed, showing irregular hypoechoic shadowing mass in the left breast at the 2:30 o'clock position, 5 cm from the nipple, measuring 3.3 x 2.8 x 2.6 cm.  In the 10:30 o'clock position of the left breast, 5 cm from the nipple, there is irregular hypoechoic mass with somewhat more lobulated margins, corresponding to the lobulated mass seen mammographically.  This measures 4.8 x 2.9 x 4.8 cm.  Both masses show internal vascularity on color doppler analysis.  Whole body Bone Scan 02/19/2017: Negative for evidence of bony metastatic disease.  CT chest/abdomen/pelvis 02/19/2017: 1. Small right middle lobe pulmonary nodules up to 0.8 cm, nonspecific.   Histology per Pathology Report:   02/23/2017 Left Breast  01/18/2017   Receptor Status: ER(+ 100%), PR (+ 60%), Her2-neu (-), Ki-67(60%)   Past/Anticipated interventions by surgeon, if any: [name] 02/23/2017 Left total mastectomy, left axillary sentinel lymph node biopsy   Past/Anticipated interventions by medical oncology, if any: Chemotherapy  Dr. Bruce Donath Cira Rue 06/23/2017 PLAN -UA and culture for dysuria and hematuria, will start empiric abx -Discontinue taxol for worsening peripheral neuropathy -continue herceptin, increase dose today and continue q3 weeks -Continue PT for lymphedema; send message to  therapists to incorporate balance exercises for neuropathy  -Start B complex vitamin for neuropathy, will consider neurontin or cymbalta if neuropathy does not improve with B vitamin and stopping taxol  -Printed information on AI and Kadcyla; will submit kadcyla for insurance approval. If approved, begin in 3 weeks -Referral to RT to discuss adjuvant radiation  -Given her strong ER and PR positive disease, I plan to start her on antiestrogen therapy anastrozole on her next visit.  Benefit and potential side effects discussed with her, she is interested.  CURRENT THERAPY: weekly taxol and Herceptinx12 weeks beginning 04/07/17; taxol dose-reduced for worsening peripheral neuropathy; followed by maintenance Herceptin for total 1 year    Lymphedema issues, if any:  Left arm, she is currently doing PT and wears a compression sleeve.  Pain issues, if any:   BP (!) 182/83 (BP Location: Right Arm, Patient Position: Sitting, Cuff Size: Normal)   Pulse 78   Temp 98.6 F (37 C) (Oral)   Resp 18   Ht 5' 4"  (1.626 m)   Wt 157 lb 3.2 oz (71.3 kg)   SpO2 99%   BMI 26.98 kg/m    Wt Readings from Last 3 Encounters:  07/01/17 157 lb 3.2 oz (71.3 kg)  06/23/17 155 lb 1.6 oz (70.4 kg)  06/09/17 155 lb 9.6 oz (70.6 kg)   SAFETY ISSUES:  Prior radiation? No  Pacemaker/ICD? No  Possible current pregnancy? No  Is the patient on methotrexate? No  Current Complaints / other details:  She is having worsening peripheral neuropathy.  Worse in her feet, causing balance alteration without falls.  Uses a cane on occasion.       Cori Razor, RN 06/30/2017,4:43 PM

## 2017-07-01 ENCOUNTER — Ambulatory Visit
Admission: RE | Admit: 2017-07-01 | Discharge: 2017-07-01 | Disposition: A | Discharge status: Home / Self Care | Payer: Medicare Other | Source: Ambulatory Visit | Attending: Radiation Oncology | Admitting: Radiation Oncology

## 2017-07-01 ENCOUNTER — Encounter: Payer: Self-pay | Admitting: Radiation Oncology

## 2017-07-01 ENCOUNTER — Ambulatory Visit: Payer: Medicare Other

## 2017-07-01 ENCOUNTER — Other Ambulatory Visit: Payer: Self-pay

## 2017-07-01 VITALS — BP 182/83 | HR 78 | Temp 98.6°F | Resp 18 | Ht 64.0 in | Wt 157.2 lb

## 2017-07-01 DIAGNOSIS — E119 Type 2 diabetes mellitus without complications: Secondary | ICD-10-CM | POA: Insufficient documentation

## 2017-07-01 DIAGNOSIS — C50212 Malignant neoplasm of upper-inner quadrant of left female breast: Secondary | ICD-10-CM

## 2017-07-01 DIAGNOSIS — C50812 Malignant neoplasm of overlapping sites of left female breast: Secondary | ICD-10-CM | POA: Diagnosis not present

## 2017-07-01 DIAGNOSIS — J45909 Unspecified asthma, uncomplicated: Secondary | ICD-10-CM | POA: Diagnosis not present

## 2017-07-01 DIAGNOSIS — Z9221 Personal history of antineoplastic chemotherapy: Secondary | ICD-10-CM | POA: Diagnosis not present

## 2017-07-01 DIAGNOSIS — Z170 Estrogen receptor positive status [ER+]: Principal | ICD-10-CM

## 2017-07-01 DIAGNOSIS — I10 Essential (primary) hypertension: Secondary | ICD-10-CM | POA: Insufficient documentation

## 2017-07-01 DIAGNOSIS — C50412 Malignant neoplasm of upper-outer quadrant of left female breast: Secondary | ICD-10-CM

## 2017-07-01 DIAGNOSIS — Z87891 Personal history of nicotine dependence: Secondary | ICD-10-CM | POA: Diagnosis not present

## 2017-07-01 DIAGNOSIS — Z7984 Long term (current) use of oral hypoglycemic drugs: Secondary | ICD-10-CM | POA: Diagnosis not present

## 2017-07-01 DIAGNOSIS — Z809 Family history of malignant neoplasm, unspecified: Secondary | ICD-10-CM | POA: Insufficient documentation

## 2017-07-01 DIAGNOSIS — E785 Hyperlipidemia, unspecified: Secondary | ICD-10-CM | POA: Diagnosis not present

## 2017-07-01 DIAGNOSIS — Z7982 Long term (current) use of aspirin: Secondary | ICD-10-CM | POA: Diagnosis not present

## 2017-07-01 DIAGNOSIS — Z79899 Other long term (current) drug therapy: Secondary | ICD-10-CM | POA: Diagnosis not present

## 2017-07-01 DIAGNOSIS — I2510 Atherosclerotic heart disease of native coronary artery without angina pectoris: Secondary | ICD-10-CM | POA: Diagnosis not present

## 2017-07-01 DIAGNOSIS — Z9012 Acquired absence of left breast and nipple: Secondary | ICD-10-CM | POA: Diagnosis not present

## 2017-07-01 NOTE — Progress Notes (Signed)
Radiation Oncology         [phone] ________________________________  Name: Penny Huynh        MRN: [id number]  Date of Service: 07/01/2017 DOB: Jun 13, 1933  NU:UVOZDG, Silvestre Moment, MD  Sasser, Silvestre Moment, MD     REFERRING PHYSICIAN: Quintin Alto Silvestre Moment, MD   DIAGNOSIS: The primary encounter diagnosis was Carcinoma of upper-inner quadrant of left breast in female, estrogen receptor positive ([place]). A diagnosis of Carcinoma of upper-outer quadrant of left breast in female, estrogen receptor positive ([place]) was also pertinent to this visit.   HISTORY OF PRESENT ILLNESS: Penny Huynh is a 84 y.o. female seen at the request of [name] for a diagnosis of HER2 amplified left breast cancer. She was found originally to have two palpable masses in the left breast that she had noted for "some time." Her mammogram on 01/13/17 revealed a mass in the upper outer breast and in the medial breast, and diagnostic ultrasound revealed a 3.3 x 2.8 x 2.6 cm mass at 2:30, and at 10:30 there was a mass measuring 4.8 x 2.9 x 4.8 cm and the axilla revealed several nodes with thickened cortices, none were enlarged by size criteria. She proceeded with biopsy on 01/18/17 which revealed at 10:30 a grade 2-3 invasive carcinoma of the breast, ER/PR positive HER2 amplified, and , no cancer in her axillary node sampled. At 2:30, a grade 2-3 invasive ductal carcinoma that wasER/PR positive HER2 negative was noted. She underwent mastectomy with sentinel node biopsy on 02/23/17, and final pathology revealed a grade 3 invasive ductal carcinoma measuring 5.3 and 3.5 cm each involving the dermis and intermediate grade DCIS, and negative margins. She had a total of 6 nodes sampled that were negative. The tumors were both ER/PR positive, and HER2 was amplified in the 5.3 cm mass with an amplification of 2.8. She went on to complete chemotherapy and ended taxol after 10 cycles due to neuropathy. She continues on herceptin and comes today to  discuss options of adjuvant radiotherapy.   PREVIOUS RADIATION THERAPY: No   PAST MEDICAL HISTORY:  Past Medical History:  Diagnosis Date  . Arthritis    "maybe a little in my left knee" (02/23/2017)  . Asthmatic bronchitis   . Cancer of left breast ([place])   . Coronary atherosclerosis    Minor nonobstructive CAD at cardiac catheterization 2009  . Dyslipidemia   . Femoral bruit 7/08   With normal ABIs  . Hypertension   . Lower extremity edema    Chronic  . Pneumonia ~ 1950   in 8th grade  . Type II diabetes mellitus ([place])        PAST SURGICAL HISTORY: Past Surgical History:  Procedure Laterality Date  . APPENDECTOMY    . BREAST BIOPSY Left 02/2017  . CARDIAC CATHETERIZATION     "long time ago" (02/23/2017)  . MASTECTOMY COMPLETE / SIMPLE W/ SENTINEL NODE BIOPSY Left 02/23/2017   TOTAL MATECTOMY;  LEFT AXILLARY SENTINEL LYMPH NODE BIOPSY ERAS PATHWAY  . MASTECTOMY WITH RADIOACTIVE SEED GUIDED EXCISION AND AXILLARY SENTINEL LYMPH NODE BIOPSY Left 02/23/2017   Procedure: LEFT TOTAL MATECTOMY;  LEFT AXILLARY SENTINEL LYMPH NODE BIOPSY ERAS PATHWAY;  Surgeon: Excell Seltzer, MD;  Location: [facility];  Service: General;  Laterality: Left;  . PILONIDAL CYST EXCISION    . PORTA CATH INSERTION  02/23/2017  . PORTACATH PLACEMENT N/A 02/23/2017   Procedure: INSERTION PORT-A-CATH;  Surgeon: Excell Seltzer, MD;  Location: [facility];  Service: General;  Laterality: N/A;  . SHOULDER ARTHROSCOPY W/ ROTATOR CUFF REPAIR Right      FAMILY HISTORY:  Family History  Problem Relation Age of Onset  . Diabetes Mother   . Stroke Father   . Cancer Father        unknown type      SOCIAL HISTORY:  reports that she quit smoking about 19 years ago. Her smoking use included cigarettes. She started smoking about 63 years ago. She has a 92.00 pack-year smoking history. She has never used smokeless tobacco. She reports that she drinks about 1.2 oz of alcohol per week. She reports that she does not use  drugs. The patient is married and lives in [place].    ALLERGIES: Patient has no known allergies.   MEDICATIONS:  Current Outpatient Medications  Medication Sig Dispense Refill  . aspirin 81 MG EC tablet Take 81 mg by mouth at bedtime.     . chlorthalidone (HYGROTON) 25 MG tablet Take 25 mg by mouth daily.     . Coenzyme Q10 (COQ10) 100 MG CAPS Take 100 mg by mouth daily.      Marland Kitchen latanoprost (XALATAN) 0.005 % ophthalmic solution Place 1 drop into both eyes at bedtime.    Marland Kitchen losartan (COZAAR) 50 MG tablet Take 50 mg by mouth daily.    . metFORMIN (GLUCOPHAGE) 500 MG tablet Take 1,000 mg by mouth daily with breakfast.     . vitamin B-12 (CYANOCOBALAMIN) 1000 MCG tablet Take 1,000 mcg by mouth daily.    . traMADol (ULTRAM) 50 MG tablet Take 1 tablet (50 mg total) by mouth every 6 (six) hours as needed (mild pain). (Patient not taking: Reported on 07/01/2017) 15 tablet 0   No current facility-administered medications for this encounter.      REVIEW OF SYSTEMS: On review of systems, the patient reports that she is doing well overall. She denies any chest pain, shortness of breath, cough, fevers, chills, night sweats, unintended weight changes. She denies any bowel or bladder disturbances, and denies abdominal pain, nausea or vomiting. She denies any new musculoskeletal or joint aches or pains. A complete review of systems is obtained and is otherwise negative.     PHYSICAL EXAM:  Wt Readings from Last 3 Encounters:  07/01/17 157 lb 3.2 oz (71.3 kg)  06/23/17 155 lb 1.6 oz (70.4 kg)  06/09/17 155 lb 9.6 oz (70.6 kg)   Temp Readings from Last 3 Encounters:  07/01/17 98.6 F (37 C) (Oral)  06/23/17 98.6 F (37 C) (Oral)  06/16/17 97.9 F (36.6 C) (Oral)   BP Readings from Last 3 Encounters:  07/01/17 (!) 182/83  06/23/17 (!) 172/69  06/16/17 (!) 157/66   Pulse Readings from Last 3 Encounters:  07/01/17 78  06/23/17 74  06/16/17 67   Pain Assessment Pain Score: 0-No  pain/10  In general this is a well appearing African American female in no acute distress. She is alert and oriented x4 and appropriate throughout the examination. HEENT reveals that the patient is normocephalic, atraumatic. EOMs are intact.  Skin is intact without any evidence of gross lesions. Cardiopulmonary assessment is negative for acute distress and she exhibits normal effort. She has a well healed left chest wall mastectomy scar and mild edema of the chest wall and is wearing her LUE compression sleeve. No erythema is noted.  ECOG = 0  0 - Asymptomatic (Fully active, able to carry on all predisease activities without restriction)  1 - Symptomatic but completely ambulatory (Restricted in  physically strenuous activity but ambulatory and able to carry out work of a light or sedentary nature. For example, light housework, office work)  2 - Symptomatic, <50% in bed during the day (Ambulatory and capable of all self care but unable to carry out any work activities. Up and about more than 50% of waking hours)  3 - Symptomatic, >50% in bed, but not bedbound (Capable of only limited self-care, confined to bed or chair 50% or more of waking hours)  4 - Bedbound (Completely disabled. Cannot carry on any self-care. Totally confined to bed or chair)  5 - Death   Eustace Pen MM, Creech RH, Tormey DC, et al. [phone]). "Toxicity and response criteria of the [facility] Group". [place] Oncol. 5 (6): 649-55    LABORATORY DATA:  Lab Results  Component Value Date   WBC 3.6 (L) 06/23/2017   HGB 10.0 (L) 06/23/2017   HCT 29.7 (L) 06/23/2017   MCV 92.9 06/23/2017   PLT 297 06/23/2017   Lab Results  Component Value Date   NA 131 (L) 06/23/2017   K 4.0 06/23/2017   CL 97 (L) 06/23/2017   CO2 26 06/23/2017   Lab Results  Component Value Date   ALT 17 06/23/2017   AST 18 06/23/2017   ALKPHOS 52 06/23/2017   BILITOT 0.5 06/23/2017      RADIOGRAPHY: No results found.      IMPRESSION/PLAN: 1. Multifocal Stage IB, pT2N0M0 grade 3 ER/PR positive invasive ductal carcinoma, and Stage IB, pT3N0M0 grade 3 triple positive invasive ductal carcinoma of the left breast. Dr. Lisbeth Renshaw discusses the pathology findings and reviews the nature of multifocal, and triple positive breast disease. We discussed the risks, benefits, short, and long term effects of radiotherapy, and the patient is interested in proceeding. Dr. Lisbeth Renshaw discusses the delivery and logistics of radiotherapy and anticipates a course of 6 1/2 weeks of radiotherapy to the chest wall and regional nodes with deep inspiration breath hold technique. She will simulate in the next week to begin treatment in the next two weeks. Written consent is obtained and placed in the chart, a copy was provided to the patient.    The above documentation reflects my direct findings during this shared patient visit. Please see the separate note by Dr. Lisbeth Renshaw on this date for the remainder of the patient's plan of care.    Carola Rhine, PAC

## 2017-07-02 ENCOUNTER — Ambulatory Visit: Payer: Medicare Other | Admitting: Physical Therapy

## 2017-07-02 DIAGNOSIS — Z483 Aftercare following surgery for neoplasm: Secondary | ICD-10-CM

## 2017-07-02 DIAGNOSIS — R2681 Unsteadiness on feet: Secondary | ICD-10-CM | POA: Diagnosis not present

## 2017-07-02 DIAGNOSIS — R293 Abnormal posture: Secondary | ICD-10-CM | POA: Diagnosis not present

## 2017-07-02 DIAGNOSIS — M25612 Stiffness of left shoulder, not elsewhere classified: Secondary | ICD-10-CM

## 2017-07-02 NOTE — Therapy (Signed)
[place], [place], [address] Phone: [phone]   Fax:  [phone]  Physical Therapy Treatment  Patient Details  Name: Penny Huynh MRN: [id number] Date of Birth: 06/21/1933 Referring Provider: [name]   Encounter Date: 07/02/2017  PT End of Session - 07/02/17 1116    Visit Number  21 KX    Number of Visits  27    Date for PT Re-Evaluation  07/23/17    PT Start Time  1021    PT Stop Time  1105    PT Time Calculation (min)  44 min    Activity Tolerance  Patient tolerated treatment well    Behavior During Therapy  [facility] for tasks assessed/performed       Past Medical History:  Diagnosis Date  . Arthritis    "maybe a little in my left knee" (02/23/2017)  . Asthmatic bronchitis   . Cancer of left breast ([place])   . Coronary atherosclerosis    Minor nonobstructive CAD at cardiac catheterization 2009  . Dyslipidemia   . Femoral bruit 7/08   With normal ABIs  . Hypertension   . Lower extremity edema    Chronic  . Pneumonia ~ 1950   in 8th grade  . Type II diabetes mellitus ([place])     Past Surgical History:  Procedure Laterality Date  . APPENDECTOMY    . BREAST BIOPSY Left 02/2017  . CARDIAC CATHETERIZATION     "long time ago" (02/23/2017)  . MASTECTOMY COMPLETE / SIMPLE W/ SENTINEL NODE BIOPSY Left 02/23/2017   TOTAL MATECTOMY;  LEFT AXILLARY SENTINEL LYMPH NODE BIOPSY ERAS PATHWAY  . MASTECTOMY WITH RADIOACTIVE SEED GUIDED EXCISION AND AXILLARY SENTINEL LYMPH NODE BIOPSY Left 02/23/2017   Procedure: LEFT TOTAL MATECTOMY;  LEFT AXILLARY SENTINEL LYMPH NODE BIOPSY ERAS PATHWAY;  Surgeon: Excell Seltzer, MD;  Location: [place];  Service: General;  Laterality: Left;  . PILONIDAL CYST EXCISION    . PORTA CATH INSERTION  02/23/2017  . PORTACATH PLACEMENT N/A 02/23/2017   Procedure: INSERTION PORT-A-CATH;  Surgeon: Excell Seltzer, MD;  Location: [place];  Service: General;   Laterality: N/A;  . SHOULDER ARTHROSCOPY W/ ROTATOR CUFF REPAIR Right     There were no vitals filed for this visit.  Subjective Assessment - 07/02/17 1024    Subjective  "I'm not doing too well with this one foot in front of the other. Is practicing the new exercises." I saw [name] Tuesday and he did draw off some more fluid.  He was surprised because he didn't get any the last time. I have an appointment next week for simulation for radiation.    Pertinent History  Patient underwent a left mastectomy and sentinel node biopsy (6 negative nodes) on 02/23/17. She will begin chemotherapy 03/31/17 as long as drain is removed today. She has 2 different breast cancers. Both are ER/PR positive but one is HER2 negative and one is HER2 positive. History of right shoulder scope 20 years ago and has diabetes and hypertension, both of which are well controlled.    Patient Stated Goals  See if my arm is ok    Currently in Pain?  No/denies                       [facility] Adult PT Treatment/Exercise - 07/02/17 0001      Neuro Re-ed    Neuro Re-ed Details   standing on mini-tramp: small bounces, weight shift  side to side and forward-back; tandem stance; standing with eyes closed.  BOSU ball on treadmill for patient to use handrails, and had her stand on ball and just balance, eyes open.      Manual Therapy   Soft tissue mobilization  around left mastectomy scar in all directions    Myofascial Release  unidirectional pull at left pectoralis lateral aspect; crosshands across left anterior axilla    Passive ROM  in supine to left shoulder for er, flexion, and abduction, prolonged stretches                  PT Long Term Goals - 06/25/17 1227      Additional Long Term Goals   Additional Long Term Goals  Yes      PT LONG TERM GOAL #7   Title  Pt. will be independent with home exercise for balance.    Status  New      Breast Clinic Goals - 02/08/17 2024      Patient will be  able to verbalize understanding of pertinent lymphedema risk reduction practices relevant to her diagnosis specifically related to skin care.   Status  Achieved      Patient will be able to return demonstrate and/or verbalize understanding of the post-op home exercise program related to regaining shoulder range of motion.   Status  Achieved      Patient will be able to verbalize understanding of the importance of attending the postoperative After Breast Cancer Class for further lymphedema risk reduction education and therapeutic exercise.   Status  Achieved           Plan - 07/02/17 1116    Clinical Impression Statement  Pt. feels challenged by balance exercises that she was given for HEP and those done in clinic. Also still with left chest tightness. She is wondering about continuing therapy during radiation treatment.    Rehab Potential  Excellent    PT Frequency  2x / week    PT Duration  4 weeks    PT Treatment/Interventions  ADLs/Self Care Home Management;Therapeutic exercise;Patient/family education;Orthotic Fit/Training;Manual techniques;Passive range of motion;Manual lymph drainage;Scar mobilization;DME Instruction    PT Next Visit Plan  Continue to include balance exercises; consider another balance assessment (besides Berg, on which she did well), such as DGI. Continue manual work on left chest.     PT Home Exercise Plan  focus on stretches to try to release cording; supine scapular series  Pt. returned to her previous exercise program at her church as of 05/17/17; horizontal abduction stretch in supine; add balance exercises    Consulted and Agree with Plan of Care  Patient       Patient will benefit from skilled therapeutic intervention in order to improve the following deficits and impairments:  Postural dysfunction, Decreased knowledge of precautions, Decreased knowledge of use of DME, Impaired UE functional use, Decreased range of motion, Decreased strength, Increased fascial  restricitons, Decreased scar mobility  Visit Diagnosis: Aftercare following surgery for neoplasm  Stiffness of left shoulder, not elsewhere classified     Problem List Patient Active Problem List   Diagnosis Date Noted  . Port-A-Cath in place 04/14/2017  . Stage II breast cancer, left ([place]) 02/23/2017  . Carcinoma of upper-outer quadrant of left breast in female, estrogen receptor positive ([place]) 02/10/2017  . Carcinoma of upper-inner quadrant of left breast in female, estrogen receptor positive ([place]) 02/10/2017  . Chest pain 07/18/2015  . Hypokalemia 07/18/2015  . Vertigo 07/18/2015  .  LBBB (left bundle branch block) 05/28/2009  . AODM 12/21/2007  . Essential hypertension, benign 12/21/2007  . DIASTOLIC DYSFUNCTION 28/00/3491  . FEMORAL BRUIT, RIGHT 12/21/2007  . DYSPNEA 12/21/2007    SALISBURY,DONNA 07/02/2017, 12:12 PM  [place], [place], [address] Phone: [phone]   Fax:  [phone]  Name: DEEPA BARTHEL MRN: [id number] Date of Birth: Dec 02, 1933  Serafina Royals, PT 07/02/17 12:12 PM

## 2017-07-05 ENCOUNTER — Ambulatory Visit: Payer: Medicare Other | Admitting: Cardiovascular Disease

## 2017-07-06 ENCOUNTER — Ambulatory Visit: Payer: Medicare Other

## 2017-07-06 ENCOUNTER — Ambulatory Visit: Payer: Medicare Other | Admitting: Radiation Oncology

## 2017-07-06 ENCOUNTER — Ambulatory Visit: Payer: Medicare Other | Attending: General Surgery | Admitting: Physical Therapy

## 2017-07-06 DIAGNOSIS — R293 Abnormal posture: Secondary | ICD-10-CM | POA: Diagnosis not present

## 2017-07-06 DIAGNOSIS — Z483 Aftercare following surgery for neoplasm: Secondary | ICD-10-CM | POA: Diagnosis not present

## 2017-07-06 DIAGNOSIS — M25612 Stiffness of left shoulder, not elsewhere classified: Secondary | ICD-10-CM | POA: Diagnosis not present

## 2017-07-06 DIAGNOSIS — R2681 Unsteadiness on feet: Secondary | ICD-10-CM | POA: Diagnosis not present

## 2017-07-06 NOTE — Therapy (Addendum)
[place], [place], [address] Phone: [phone]   Fax:  [phone]  Physical Therapy Treatment  Patient Details  Name: Penny Huynh MRN: [id number] Date of Birth: 11/08/1933 Referring Provider: [name]   Encounter Date: 07/06/2017  PT End of Session - 07/06/17 1357    Visit Number  73 KX    Number of Visits  27    Date for PT Re-Evaluation  07/23/17    PT Start Time  1304    PT Stop Time  1349    PT Time Calculation (min)  45 min    Activity Tolerance  Patient tolerated treatment well    Behavior During Therapy  [facility] for tasks assessed/performed       Past Medical History:  Diagnosis Date  . Arthritis    "maybe a little in my left knee" (02/23/2017)  . Asthmatic bronchitis   . Cancer of left breast ([place])   . Coronary atherosclerosis    Minor nonobstructive CAD at cardiac catheterization 2009  . Dyslipidemia   . Femoral bruit 7/08   With normal ABIs  . Hypertension   . Lower extremity edema    Chronic  . Pneumonia ~ 1950   in 8th grade  . Type II diabetes mellitus ([place])     Past Surgical History:  Procedure Laterality Date  . APPENDECTOMY    . BREAST BIOPSY Left 02/2017  . CARDIAC CATHETERIZATION     "long time ago" (02/23/2017)  . MASTECTOMY COMPLETE / SIMPLE W/ SENTINEL NODE BIOPSY Left 02/23/2017   TOTAL MATECTOMY;  LEFT AXILLARY SENTINEL LYMPH NODE BIOPSY ERAS PATHWAY  . MASTECTOMY WITH RADIOACTIVE SEED GUIDED EXCISION AND AXILLARY SENTINEL LYMPH NODE BIOPSY Left 02/23/2017   Procedure: LEFT TOTAL MATECTOMY;  LEFT AXILLARY SENTINEL LYMPH NODE BIOPSY ERAS PATHWAY;  Surgeon: Excell Seltzer, MD;  Location: [facility];  Service: General;  Laterality: Left;  . PILONIDAL CYST EXCISION    . PORTA CATH INSERTION  02/23/2017  . PORTACATH PLACEMENT N/A 02/23/2017   Procedure: INSERTION PORT-A-CATH;  Surgeon: Excell Seltzer, MD;  Location: [facility];  Service: General;   Laterality: N/A;  . SHOULDER ARTHROSCOPY W/ ROTATOR CUFF REPAIR Right     There were no vitals filed for this visit.  Subjective Assessment - 07/06/17 1305    Subjective  "I've got a simulation scheduled for Thursday."    Pertinent History  Patient underwent a left mastectomy and sentinel node biopsy (6 negative nodes) on 02/23/17. She will begin chemotherapy 03/31/17 as long as drain is removed today. She has 2 different breast cancers. Both are ER/PR positive but one is HER2 negative and one is HER2 positive. History of right shoulder scope 20 years ago and has diabetes and hypertension, both of which are well controlled.    Currently in Pain?  No/denies                       [facility] Adult PT Treatment/Exercise - 07/06/17 0001      Neuro Re-ed    Neuro Re-ed Details   stand on BOSU ball (on treadmill to use handrails: balance in place, weight shift side to side, mini-squats, with hands on railings as needed; standing on foam pad in corner: tandem stance, feet close together with eyes open and with eyes closed, stepping in place      Manual Therapy   Soft tissue mobilization  to cording which is visible and palpable at left  axilla today; around left mastectomy site with movement in all directions    Myofascial Release  crosshands across left axilla; crosshands from left upper arm to abdomen in diagonal; left UE myofascial pulling    Passive ROM  in supine to left shoulder for er, flexion, and abduction, prolonged stretches; also into position for radiation      Prosthetics   Edema                     PT Long Term Goals - 06/25/17 1227      Additional Long Term Goals   Additional Long Term Goals  Yes      PT LONG TERM GOAL #7   Title  Pt. will be independent with home exercise for balance.    Status  New      Breast Clinic Goals - 02/08/17 2024      Patient will be able to verbalize understanding of pertinent lymphedema risk reduction practices relevant  to her diagnosis specifically related to skin care.   Status  Achieved      Patient will be able to return demonstrate and/or verbalize understanding of the post-op home exercise program related to regaining shoulder range of motion.   Status  Achieved      Patient will be able to verbalize understanding of the importance of attending the postoperative After Breast Cancer Class for further lymphedema risk reduction education and therapeutic exercise.   Status  Achieved           Plan - 07/06/17 1357    Clinical Impression Statement  Pt.'s cording at left axilla was again visible and palpable today--it hadn't been recently. Focused to some degree on this today; direct pressure on the cord was painful, so more stretching was done indirectly, and seemed to loosen it some. Also continued other manual work as well as balance today.    Rehab Potential  Excellent    PT Frequency  2x / week    PT Duration  4 weeks    PT Treatment/Interventions  ADLs/Self Care Home Management;Therapeutic exercise;Patient/family education;Orthotic Fit/Training;Manual techniques;Passive range of motion;Manual lymph drainage;Scar mobilization;DME Instruction    PT Next Visit Plan  Check left shoulder abduction and check goals. Add exercises standing on cushion to HEP (pt. says she has a cushion at home to use). Continue to include balance exercises; consider another balance assessment (besides Berg, on which she did well), such as DGI. Continue manual work on left chest.     PT Home Exercise Plan  focus on stretches to try to release cording; supine scapular series  Pt. returned to her previous exercise program at her church as of 05/17/17; horizontal abduction stretch in supine; add balance exercises    Consulted and Agree with Plan of Care  Patient       Patient will benefit from skilled therapeutic intervention in order to improve the following deficits and impairments:  Postural dysfunction, Decreased knowledge of  precautions, Decreased knowledge of use of DME, Impaired UE functional use, Decreased range of motion, Decreased strength, Increased fascial restricitons, Decreased scar mobility  Visit Diagnosis: Aftercare following surgery for neoplasm  Stiffness of left shoulder, not elsewhere classified  Abnormal posture  Unsteadiness on feet     Problem List Patient Active Problem List   Diagnosis Date Noted  . Port-A-Cath in place 04/14/2017  . Stage II breast cancer, left ([place]) 02/23/2017  . Carcinoma of upper-outer quadrant of left breast in female, estrogen receptor positive ([place])  02/10/2017  . Carcinoma of upper-inner quadrant of left breast in female, estrogen receptor positive ([place]) 02/10/2017  . Chest pain 07/18/2015  . Hypokalemia 07/18/2015  . Vertigo 07/18/2015  . LBBB (left bundle branch block) 05/28/2009  . AODM 12/21/2007  . Essential hypertension, benign 12/21/2007  . DIASTOLIC DYSFUNCTION 70/17/7939  . FEMORAL BRUIT, RIGHT 12/21/2007  . DYSPNEA 12/21/2007    Lou Irigoyen 07/06/2017, 2:04 PM  [place], [place], [address] Phone: [phone]   Fax:  [phone]  Name: Penny Huynh MRN: [id number] Date of Birth: 01/21/1934  Serafina Royals, PT 07/06/17 2:04 PM

## 2017-07-08 ENCOUNTER — Other Ambulatory Visit: Payer: Self-pay

## 2017-07-08 ENCOUNTER — Ambulatory Visit (INDEPENDENT_AMBULATORY_CARE_PROVIDER_SITE_OTHER): Payer: Medicare Other

## 2017-07-08 ENCOUNTER — Ambulatory Visit
Admission: RE | Admit: 2017-07-08 | Discharge: 2017-07-08 | Disposition: A | Discharge status: Home / Self Care | Payer: Medicare Other | Source: Ambulatory Visit | Attending: Radiation Oncology | Admitting: Radiation Oncology

## 2017-07-08 DIAGNOSIS — Z170 Estrogen receptor positive status [ER+]: Secondary | ICD-10-CM | POA: Insufficient documentation

## 2017-07-08 DIAGNOSIS — I447 Left bundle-branch block, unspecified: Secondary | ICD-10-CM | POA: Diagnosis not present

## 2017-07-08 DIAGNOSIS — I519 Heart disease, unspecified: Secondary | ICD-10-CM | POA: Diagnosis not present

## 2017-07-08 DIAGNOSIS — Z510 Encounter for antineoplastic radiation therapy: Secondary | ICD-10-CM | POA: Insufficient documentation

## 2017-07-08 DIAGNOSIS — C50212 Malignant neoplasm of upper-inner quadrant of left female breast: Secondary | ICD-10-CM | POA: Diagnosis not present

## 2017-07-08 DIAGNOSIS — Z5111 Encounter for antineoplastic chemotherapy: Secondary | ICD-10-CM | POA: Diagnosis not present

## 2017-07-08 DIAGNOSIS — D493 Neoplasm of unspecified behavior of breast: Secondary | ICD-10-CM

## 2017-07-09 ENCOUNTER — Ambulatory Visit (INDEPENDENT_AMBULATORY_CARE_PROVIDER_SITE_OTHER): Payer: Medicare Other | Admitting: Cardiovascular Disease

## 2017-07-09 ENCOUNTER — Ambulatory Visit: Payer: Medicare Other | Admitting: Physical Therapy

## 2017-07-09 ENCOUNTER — Encounter: Payer: Self-pay | Admitting: Cardiovascular Disease

## 2017-07-09 VITALS — BP 178/77 | HR 67 | Ht 64.5 in | Wt 155.8 lb

## 2017-07-09 DIAGNOSIS — Z483 Aftercare following surgery for neoplasm: Secondary | ICD-10-CM

## 2017-07-09 DIAGNOSIS — Z170 Estrogen receptor positive status [ER+]: Secondary | ICD-10-CM

## 2017-07-09 DIAGNOSIS — R293 Abnormal posture: Secondary | ICD-10-CM

## 2017-07-09 DIAGNOSIS — M25612 Stiffness of left shoulder, not elsewhere classified: Secondary | ICD-10-CM | POA: Diagnosis not present

## 2017-07-09 DIAGNOSIS — C50412 Malignant neoplasm of upper-outer quadrant of left female breast: Secondary | ICD-10-CM | POA: Diagnosis not present

## 2017-07-09 DIAGNOSIS — I10 Essential (primary) hypertension: Secondary | ICD-10-CM

## 2017-07-09 DIAGNOSIS — I2510 Atherosclerotic heart disease of native coronary artery without angina pectoris: Secondary | ICD-10-CM

## 2017-07-09 DIAGNOSIS — R2681 Unsteadiness on feet: Secondary | ICD-10-CM | POA: Diagnosis not present

## 2017-07-09 DIAGNOSIS — I519 Heart disease, unspecified: Secondary | ICD-10-CM | POA: Diagnosis not present

## 2017-07-09 NOTE — Patient Instructions (Signed)
Medication Instructions:  Your physician recommends that you continue on your current medications as directed. Please refer to the Current Medication list given to you today.  Labwork: NONE  Testing/Procedures: NONE  Follow-Up: Your physician wants you to follow-up in: [place]. [place]. You will receive a reminder letter in the mail two months in advance. If you don't receive a letter, please call our office to schedule the follow-up appointment.  Any Other Special Instructions Will Be Listed Below (If Applicable). Your physician has requested that you regularly monitor and record your blood pressure readings at home FOR THE NEXT 2-3 WEEKS. Please use the same machine at the same time of day to check your readings and record them to bring to your follow-up visit.   If you need a refill on your cardiac medications before your next appointment, please call your pharmacy.

## 2017-07-09 NOTE — Progress Notes (Signed)
SUBJECTIVE: The patient presents for annual follow-up. She has nonobstructive coronary artery disease, hypertension, and diastolic dysfunction. She underwent a normal nuclear stress test on 08/09/15 and demonstrated a hypertensive response to exercise.  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 She had also been taking an [place] class and a math class.  She had to stop doing so temporarily due to a diagnosis of left-sided breast cancer and eventual management.  Echocardiogram performed on 07/08/2017 demonstrated normal left ventricular systolic function, LVEF 50 to 41%, grade 1 diastolic dysfunction, high ventricular filling pressures, mild focal basal septal hypertrophy, and mild mitral regurgitation.  This was a slight decline in LVEF when compared to the echocardiogram performed in January at which time it was 55 to 60%.  However, it is still normal.  Since her last visit with me, she was diagnosed with left breast cancer via mammography on 01/13/2017.  She underwent left total mastectomy in January 2019.  She was treated with Taxol and Herceptin.  She is scheduled to undergo radiation therapy beginning June 18.  She is no longer on Taxol as it led to peripheral neuropathy.  She receives Herceptin.  She denies chest pain and palpitations as well as lightheadedness, dizziness, and syncope.  She does have some exertional dyspnea.  She denies leg swelling.  Her left arm is in a sleeve for lymphedema.  She has been walking on a track.  She checks her blood pressure at home and it generally runs in the 124/68 range.    Review of Systems: As per "subjective", otherwise negative.  No Known Allergies  Current Outpatient Medications  Medication Sig Dispense Refill  . aspirin 81 MG EC tablet Take 81 mg by mouth at bedtime.     . chlorthalidone (HYGROTON) 25 MG tablet Take 25 mg by mouth daily.     . Coenzyme Q10 (COQ10) 100  MG CAPS Take 100 mg by mouth daily.      Marland Kitchen latanoprost (XALATAN) 0.005 % ophthalmic solution Place 1 drop into both eyes at bedtime.    Marland Kitchen losartan (COZAAR) 50 MG tablet Take 50 mg by mouth daily.    . metFORMIN (GLUCOPHAGE) 500 MG tablet Take 1,000 mg by mouth daily with breakfast.     . traMADol (ULTRAM) 50 MG tablet Take 1 tablet (50 mg total) by mouth every 6 (six) hours as needed (mild pain). 15 tablet 0  . vitamin B-12 (CYANOCOBALAMIN) 1000 MCG tablet Take 1,000 mcg by mouth daily.     No current facility-administered medications for this visit.     Past Medical History:  Diagnosis Date  . Arthritis    "maybe a little in my left knee" (02/23/2017)  . Asthmatic bronchitis   . Cancer of left breast ([place])   . Coronary atherosclerosis    Minor nonobstructive CAD at cardiac catheterization 2009  . Dyslipidemia   . Femoral bruit 7/08   With normal ABIs  . Hypertension   . Lower extremity edema    Chronic  . Pneumonia ~ 1950   in 8th grade  . Type II diabetes mellitus ([place])     Past Surgical History:  Procedure Laterality Date  . APPENDECTOMY    . BREAST BIOPSY Left 02/2017  . CARDIAC CATHETERIZATION     "long time ago" (02/23/2017)  . MASTECTOMY COMPLETE / SIMPLE W/ SENTINEL NODE BIOPSY Left 02/23/2017   TOTAL MATECTOMY;  LEFT AXILLARY SENTINEL LYMPH NODE BIOPSY ERAS  PATHWAY  . MASTECTOMY WITH RADIOACTIVE SEED GUIDED EXCISION AND AXILLARY SENTINEL LYMPH NODE BIOPSY Left 02/23/2017   Procedure: LEFT TOTAL MATECTOMY;  LEFT AXILLARY SENTINEL LYMPH NODE BIOPSY ERAS PATHWAY;  Surgeon: Excell Seltzer, MD;  Location: [facility];  Service: General;  Laterality: Left;  . PILONIDAL CYST EXCISION    . PORTA CATH INSERTION  02/23/2017  . PORTACATH PLACEMENT N/A 02/23/2017   Procedure: INSERTION PORT-A-CATH;  Surgeon: Excell Seltzer, MD;  Location: [facility];  Service: General;  Laterality: N/A;  . SHOULDER ARTHROSCOPY W/ ROTATOR CUFF REPAIR Right     Social History   Socioeconomic  History  . Marital status: Married    Spouse name: Not on file  . Number of children: 2  . Years of education: Not on file  . Highest education level: Not on file  Occupational History  . Occupation: Counselling psychologist for [employer]    Comment: only works during tax season but not currently   . Occupation: in Dentist   . Occupation: retired Mining engineer of education     Comment: [place]  . Financial resource strain: Not on file  . Food insecurity:    Worry: Not on file    Inability: Not on file  . Transportation needs:    Medical: Not on file    Non-medical: Not on file  Tobacco Use  . Smoking status: Former Smoker    Packs/day: 2.00    Years: 46.00    Pack years: 92.00    Types: Cigarettes    Start date: 11/09/1953    Last attempt to quit: 2000    Years since quitting: 19.4  . Smokeless tobacco: Never Used  Substance and Sexual Activity  . Alcohol use: Yes    Alcohol/week: 1.2 oz    Types: 2 Glasses of wine per week  . Drug use: No  . Sexual activity: Not on file  Lifestyle  . Physical activity:    Days per week: Not on file    Minutes per session: Not on file  . Stress: Not on file  Relationships  . Social connections:    Talks on phone: Not on file    Gets together: Not on file    Attends religious service: Not on file    Active member of club or organization: Not on file    Attends meetings of clubs or organizations: Not on file    Relationship status: Not on file  . Intimate partner violence:    Fear of current or ex partner: Not on file    Emotionally abused: Not on file    Physically abused: Not on file    Forced sexual activity: Not on file  Other Topics Concern  . Not on file  Social History Narrative   Lives in [place] with her husband.   Has 2 grown children.     Vitals:   07/09/17 0809  BP: (!) 178/77  Pulse: 67  SpO2: 98%  Weight: 155 lb 12.8 oz (70.7 kg)  Height: 5' 4.5" (1.638 m)    Wt Readings from Last 3 Encounters:  07/09/17 155 lb  12.8 oz (70.7 kg)  07/01/17 157 lb 3.2 oz (71.3 kg)  06/23/17 155 lb 1.6 oz (70.4 kg)     PHYSICAL EXAM General: NAD HEENT: Normal. Neck: No JVD, no thyromegaly. Lungs: Clear to auscultation bilaterally with normal respiratory effort. CV: Regular rate and rhythm, normal S1/S2, no S3/S4, no murmur.  Left arm and sleeve.  No  lower extremity edema. Abdomen: Soft, nontender, no distention.  Neurologic: Alert and oriented.  Psych: Normal affect. Skin: Normal. Musculoskeletal: No gross deformities.    ECG: Most recent ECG reviewed.   Labs: Lab Results  Component Value Date/Time   K 4.0 06/23/2017 10:04 AM   BUN 11 06/23/2017 10:04 AM   CREATININE 0.69 06/23/2017 10:04 AM   ALT 17 06/23/2017 10:04 AM   HGB 10.0 (L) 06/23/2017 10:04 AM     Lipids: No results found for: LDLCALC, LDLDIRECT, CHOL, TRIG, HDL     ASSESSMENT AND PLAN:  1. Nonobstructive CAD: Symptomatically stable. Normal nuclear stress test on 08/09/15.  Currently on aspirin.  2. Essential HTN: Elevated on current doses of chlorthalidone and losartan.  However, he has been normal at home. I have asked the patient to check blood pressure readings 4-5 times per week, at different times throughout the day, in order to get a better approximation of mean BP values. These results will be provided to me at the end of that period so that I can determine if antihypertensive medication titration is indicated. If blood pressure remains elevated, I would consider the addition of carvedilol for reasons mentioned in #4.  3. Diastolic dysfunction: No evidence of volume overload.  Blood pressure is elevated.  See discussion and #2.  4.  Left-sided breast cancer status post treatment with Taxol and Herceptin: Cardiac function will need continued monitoring.  While LVEF has slightly declined to 50 to 55% (55 to 60% in January 2019), it is still within the realm of normalcy.  If blood pressure remains elevated, I would consider the  addition of carvedilol. I will continue to monitor cardiac function.    Disposition: Follow up 6 months   Kate Sable, M.D., F.A.C.C.

## 2017-07-09 NOTE — Progress Notes (Signed)
  Radiation Oncology         [phone]) [phone] ________________________________  Name: SHARYL PANCHAL MRN: [id number]  Date: 07/08/2017  DOB: 1933/12/02  Optical Surface Tracking Plan:  Since intensity modulated radiotherapy (IMRT) and 3D conformal radiation treatment methods are predicated on accurate and precise positioning for treatment, intrafraction motion monitoring is medically necessary to ensure accurate and safe treatment delivery.  The ability to quantify intrafraction motion without excessive ionizing radiation dose can only be performed with optical surface tracking. Accordingly, surface imaging offers the opportunity to obtain 3D measurements of patient position throughout IMRT and 3D treatments without excessive radiation exposure.  I am ordering optical surface tracking for this patient's upcoming course of radiotherapy. ________________________________  Kyung Rudd, MD 07/09/2017 9:24 PM    Reference:   Ursula Alert, J, et al. Surface imaging-based analysis of intrafraction motion for breast radiotherapy patients.Journal of [place], n. 6, nov. 2014. ISSN [id number].   Available at: <http://www.jacmp.org/index.php/jacmp/article/view/4957>.

## 2017-07-09 NOTE — Progress Notes (Signed)
  Radiation Oncology         [phone] ________________________________  Name: Penny Huynh MRN: [id number]  Date: 07/08/2017  DOB: 02-07-1933  Diagnosis DIAGNOSIS:     ICD-10-CM   1. Carcinoma of upper-inner quadrant of left breast in female, estrogen receptor positive ([place]) C50.212    Z17.0      SIMULATION AND TREATMENT PLANNING NOTE  The patient presented for simulation prior to beginning her course of radiation treatment for her diagnosis of left-sided breast cancer. The patient was placed in a supine position on a breast board. A customized vac-lock bag was also constructed and this complex treatment device will be used on a daily basis during her treatment. In this fashion, a CT scan was obtained through the chest area and an isocenter was placed near the chest wall at the upper aspect of the right chest. A breath-hold technique has also been evaluated to determine if this significantly improves the spatial relationship between the target region and the heart. Based on this analysis, a breath-hold technique has been ordered for the patient's treatment.  The patient will be planned to receive a course of radiation initially to a dose of 50.4 gray. This will consist of a 4 field technique targeting the left chest wall as well as the supraclavicular region. Therefore 2 customized medial and lateral tangent fields have been created targeting the chest wall, and also 2 additional customized fields have been designed to treat the supraclavicular region both with a left supraclavicular field and a left posterior axillary boost field. A forward planning/reduced field technique will also be evaluated to determine if this significantly improves the dose homogeneity of the overall plan. Therefore, additional customized blocks/fields may be necessary.  This initial treatment will be accomplished at 1.8 gray per fraction.   The initial plan will consist of a 3-D conformal technique. The target  volume/scar, heart and lungs have been contoured and dose volume histograms of each of these structures will be evaluated as part of the 3-D conformal treatment planning process.   It is anticipated that the patient will then receive a 10 gray boost to the surgical scar. This will be accomplished at 2 gray per fraction. The final anticipated total dose therefore will correspond to 60.4 gray.   Special treatment procedure was performed today due to the extra time and effort required by myself to plan and prepare this patient for deep inspiration breath hold technique.  I have determined cardiac sparing to be of benefit to this patient to prevent long term cardiac damage due to radiation of the heart.  Bellows were placed on the patient's abdomen. To facilitate cardiac sparing, the patient was coached by the radiation therapists on breath hold techniques and breathing practice was performed. Practice waveforms were obtained. The patient was then scanned while maintaining breath hold in the treatment position.  This image was then transferred over to the imaging specialist. The imaging specialist then created a fusion of the free breathing and breath hold scans using the chest wall as the stable structure. I personally reviewed the fusion in axial, coronal and sagittal image planes.  Excellent cardiac sparing was obtained.  I felt the patient is an appropriate candidate for breath hold and the patient will be treated as such.  The image fusion was then reviewed with the patient to reinforce the necessity of reproducible breath hold.    _______________________________   Jodelle Gross, MD, PhD

## 2017-07-09 NOTE — Therapy (Signed)
[place], [place], [address] Phone: [phone]   Fax:  [phone]  Physical Therapy Treatment  Patient Details  Name: Penny Huynh MRN: [id number] Date of Birth: 08-Jan-1934 Referring Provider: [name]   Encounter Date: 07/09/2017  PT End of Session - 07/09/17 1208    Visit Number  63 KX    Number of Visits  27    Date for PT Re-Evaluation  07/23/17    PT Start Time  1020    PT Stop Time  1103    PT Time Calculation (min)  43 min    Activity Tolerance  Patient tolerated treatment well    Behavior During Therapy  [facility] for tasks assessed/performed       Past Medical History:  Diagnosis Date  . Arthritis    "maybe a little in my left knee" (02/23/2017)  . Asthmatic bronchitis   . Cancer of left breast ([place])   . Coronary atherosclerosis    Minor nonobstructive CAD at cardiac catheterization 2009  . Dyslipidemia   . Femoral bruit 7/08   With normal ABIs  . Hypertension   . Lower extremity edema    Chronic  . Pneumonia ~ 1950   in 8th grade  . Type II diabetes mellitus ([place])     Past Surgical History:  Procedure Laterality Date  . APPENDECTOMY    . BREAST BIOPSY Left 02/2017  . CARDIAC CATHETERIZATION     "long time ago" (02/23/2017)  . MASTECTOMY COMPLETE / SIMPLE W/ SENTINEL NODE BIOPSY Left 02/23/2017   TOTAL MATECTOMY;  LEFT AXILLARY SENTINEL LYMPH NODE BIOPSY ERAS PATHWAY  . MASTECTOMY WITH RADIOACTIVE SEED GUIDED EXCISION AND AXILLARY SENTINEL LYMPH NODE BIOPSY Left 02/23/2017   Procedure: LEFT TOTAL MATECTOMY;  LEFT AXILLARY SENTINEL LYMPH NODE BIOPSY ERAS PATHWAY;  Surgeon: Excell Seltzer, MD;  Location: [facility];  Service: General;  Laterality: Left;  . PILONIDAL CYST EXCISION    . PORTA CATH INSERTION  02/23/2017  . PORTACATH PLACEMENT N/A 02/23/2017   Procedure: INSERTION PORT-A-CATH;  Surgeon: Excell Seltzer, MD;  Location: [facility];  Service: General;   Laterality: N/A;  . SHOULDER ARTHROSCOPY W/ ROTATOR CUFF REPAIR Right     There were no vitals filed for this visit.  Subjective Assessment - 07/09/17 1022    Subjective  Had her simulation yesterday and that went fine. She will start treatment on 07/20/17.    Pertinent History  Patient underwent a left mastectomy and sentinel node biopsy (6 negative nodes) on 02/23/17. She will begin chemotherapy 03/31/17 as long as drain is removed today. She has 2 different breast cancers. Both are ER/PR positive but one is HER2 negative and one is HER2 positive. History of right shoulder scope 20 years ago and has diabetes and hypertension, both of which are well controlled.    Patient Stated Goals  See if my arm is ok    Currently in Pain?  No/denies                       [facility] Adult PT Treatment/Exercise - 07/09/17 0001      High Level Balance   High Level Balance Activities  Head turns up, down, right, left, straight x 20 feet x 8    High Level Balance Comments  Also standing on foam pad: eyes closed, eyes closed head turns. Standing on firm surface: active hip abduction x 5 reps each side  Manual Therapy   Soft tissue mobilization  to cording which is visible and palpable at left axilla today; around left mastectomy site with movement in all directions    Myofascial Release  crosshands across left axilla; left UE myofascial pulling; crosshands from left upper arm to mid-abdomen in diagonal across incision area             PT Education - 07/09/17 1104    Education provided  Yes    Education Details  balance exercises: standing on firm surface, active hip abduction; standing on foam pad, eyes closed and eyes closed with head turns    Person(s) Educated  Patient    Methods  Explanation;Verbal cues;Handout    Comprehension  Verbalized understanding;Returned demonstration          PT Long Term Goals - 06/25/17 1227      Additional Long Term Goals   Additional Long  Term Goals  Yes      PT LONG TERM GOAL #7   Title  Pt. will be independent with home exercise for balance.    Status  New      Breast Clinic Goals - 02/08/17 2024      Patient will be able to verbalize understanding of pertinent lymphedema risk reduction practices relevant to her diagnosis specifically related to skin care.   Status  Achieved      Patient will be able to return demonstrate and/or verbalize understanding of the post-op home exercise program related to regaining shoulder range of motion.   Status  Achieved      Patient will be able to verbalize understanding of the importance of attending the postoperative After Breast Cancer Class for further lymphedema risk reduction education and therapeutic exercise.   Status  Achieved           Plan - 07/09/17 1208    Clinical Impression Statement  Pt. looks steadier with her gait today. More HEP given for balance. Cording again visible and palpable--and tender-- at left axilla.    Rehab Potential  Excellent    PT Frequency  2x / week    PT Duration  4 weeks    PT Treatment/Interventions  ADLs/Self Care Home Management;Therapeutic exercise;Patient/family education;Orthotic Fit/Training;Manual techniques;Passive range of motion;Manual lymph drainage;Scar mobilization;DME Instruction    PT Next Visit Plan  Check left shoulder abduction and check goals. Continue to include balance exercises; consider another balance assessment (besides Berg, on which she did well), such as DGI. Continue manual work on left chest.     PT Home Exercise Plan  focus on stretches to try to release cording; supine scapular series  Pt. returned to her previous exercise program at her church as of 05/17/17; horizontal abduction stretch in supine; add balance exercises    Consulted and Agree with Plan of Care  Patient       Patient will benefit from skilled therapeutic intervention in order to improve the following deficits and impairments:  Postural  dysfunction, Decreased knowledge of precautions, Decreased knowledge of use of DME, Impaired UE functional use, Decreased range of motion, Decreased strength, Increased fascial restricitons, Decreased scar mobility  Visit Diagnosis: Aftercare following surgery for neoplasm  Stiffness of left shoulder, not elsewhere classified  Abnormal posture  Unsteadiness on feet     Problem List Patient Active Problem List   Diagnosis Date Noted  . Port-A-Cath in place 04/14/2017  . Stage II breast cancer, left ([place]) 02/23/2017  . Carcinoma of upper-outer quadrant of left breast in female,  estrogen receptor positive ([place]) 02/10/2017  . Carcinoma of upper-inner quadrant of left breast in female, estrogen receptor positive ([place]) 02/10/2017  . Chest pain 07/18/2015  . Hypokalemia 07/18/2015  . Vertigo 07/18/2015  . LBBB (left bundle branch block) 05/28/2009  . AODM 12/21/2007  . Essential hypertension, benign 12/21/2007  . DIASTOLIC DYSFUNCTION 40/99/2780  . FEMORAL BRUIT, RIGHT 12/21/2007  . DYSPNEA 12/21/2007    SALISBURY,DONNA 07/09/2017, 12:10 PM  [place], [place], [address] Phone: [phone]   Fax:  [phone]  Name: ANNALYSSA THUNE MRN: [id number] Date of Birth: 02-21-1933  Serafina Royals, PT 07/09/17 12:10 PM

## 2017-07-09 NOTE — Patient Instructions (Signed)
Balance: Leg Abduction, Eyes Open - Unilateral (Varied Surfaces)    Stand on left leg, eyes open. Reach other leg out to side, foot close to floor. Maintain balance and constant speed. Repeat __5__ times per set. Do __1-2__ sets per session. Do __5__ sessions per week. Repeat on compliant surface: foam.  Copyright  VHI. All rights reserved.    Balance: Neck Rotation, Eyes Closed - Bilateral (Varied Surfaces)    Stand, feet shoulder width, close eyes. Balance and turn head and neck from side to side. Repeat __5__ times per set. Do _2___ sets per session. Do __5__ sessions per week. Repeat on compliant surface: foam.  Copyright  VHI. All rights reserved.    Copyright  VHI. All rights reserved.  Balance: Eyes Closed - Bilateral (Varied Surfaces)    Stand on foam cushion, feet shoulder width apart, close eyes. Maintain balance __10__ seconds. Repeat __5__ times per set. Do __1__ sets per session. Do __5__ sessions per week.   Copyright  VHI. All rights reserved.

## 2017-07-13 ENCOUNTER — Ambulatory Visit: Payer: Medicare Other

## 2017-07-13 DIAGNOSIS — R293 Abnormal posture: Secondary | ICD-10-CM

## 2017-07-13 DIAGNOSIS — Z483 Aftercare following surgery for neoplasm: Secondary | ICD-10-CM

## 2017-07-13 DIAGNOSIS — R2681 Unsteadiness on feet: Secondary | ICD-10-CM

## 2017-07-13 DIAGNOSIS — M25612 Stiffness of left shoulder, not elsewhere classified: Secondary | ICD-10-CM

## 2017-07-13 NOTE — Patient Instructions (Signed)
Cancer Rehab [phone] HIP: Flexion Standing    Holding onto counter lift leg straight in front keeping knee straight. Slow and controlled! _10__ reps per set, _2-3__ sets per day. Then repeat with other leg.   Hip Extension (Standing)    Stand with support at counter. Squeeze pelvic floor and hold so as not to twist hips and don't lean forward.  Move right leg backward with straight knee. Slow and controlled! Repeat _10__ times. Do _2-3__ times a day. Repeat with other leg.  Hip Abduction (Standing)    Stand with support. Squeeze pelvic floor and hold. Lift right leg out to side, keeping toe forward.  Repeat _10__ times. Do _2-3__ times a day. Repeat with other leg.     

## 2017-07-13 NOTE — Progress Notes (Signed)
Penny Huynh  [phone] [phone]  Clinic Follow up Note   Patient Care Team: Sasser, Silvestre Moment, MD as PCP - General (Cardiology) [name], MD as PCP - Cardiology (Cardiology) Truitt Merle, MD as Consulting Physician (Hematology) Excell Seltzer, MD as Consulting Physician (General Surgery) Alla Feeling, NP as Nurse Practitioner (Nurse Practitioner) [name], MD as Attending Physician (Cardiology)   Date of Service:  07/14/2017  SUMMARY OF ONCOLOGIC HISTORY: Cancer Staging Carcinoma of upper-inner quadrant of left breast in female, estrogen receptor positive ([place]) Staging form: Breast, AJCC 8th Edition - Clinical stage from 01/18/2017: Stage IB (cT2, cN0, cM0, G3, ER: Positive, PR: Positive, HER2: Positive) - Signed by Truitt Merle, MD on 02/10/2017 - Pathologic stage from 02/23/2017: Stage IB (pT3, pN0, cM0, G3, ER: Positive, PR: Positive, HER2: Positive) - Signed by Alla Feeling, NP on 03/16/2017  Carcinoma of upper-outer quadrant of left breast in female, estrogen receptor positive ([place]) Staging form: Breast, AJCC 8th Edition - Clinical stage from 01/18/2017: Stage IB (cT2, cN0, cM0, G2, ER: Positive, PR: Positive, HER2: Negative) - Unsigned - Pathologic stage from 02/23/2017: Stage IB (pT2, pN0(sn), cM0, G3, ER: Positive, PR: Positive, HER2: Negative) - Signed by Alla Feeling, NP on 03/16/2017  Oncology History   Cancer Staging Carcinoma of upper-inner quadrant of left breast in female, estrogen receptor positive ([place]) Staging form: Breast, AJCC 8th Edition - Clinical stage from 01/18/2017: Stage IB (cT2, cN0, cM0, G3, ER: Positive, PR: Positive, HER2: Positive) - Signed by Truitt Merle, MD on 02/10/2017 - Pathologic stage from 02/23/2017: Stage IB (pT3, pN0, cM0, G3, ER: Positive, PR: Positive, HER2: Positive) - Signed by Alla Feeling, NP on 03/16/2017  Carcinoma of upper-outer quadrant of left breast in female, estrogen  receptor positive ([place]) Staging form: Breast, AJCC 8th Edition - Clinical stage from 01/18/2017: Stage IB (cT2, cN0, cM0, G2, ER: Positive, PR: Positive, HER2: Negative) - Unsigned - Pathologic stage from 02/23/2017: Stage IB (pT2, pN0(sn), cM0, G3, ER: Positive, PR: Positive, HER2: Negative) - Signed by Alla Feeling, NP on 03/16/2017       Carcinoma of upper-outer quadrant of left breast in female, estrogen receptor positive ([place])   01/13/2017 Mammogram    FINDINGS: In the left breast, a spiculated mass lies in the upper outer quadrant. In the medial posterior left breast, there is a larger lobulated mass. These masses correspond to the palpable abnormalities. No other discrete left breast masses.  In the right breast, there are no discrete masses. There are no areas of architectural distortion.  In both breasts there are scattered calcifications, rounded punctate, with a few other larger coarse dystrophic calcifications, without significant change from the previous screening mammogram, which is dated 12/04/2009.  Mammographic images were processed with CAD.  On physical exam, there is a firm palpable mass in the upper outer left breast and another firm mass in the medial left breast.       01/13/2017 Breast US    Targeted ultrasound is performed, showing an irregular hypoechoic shadowing mass in the left breast at the 2:30 o'clock position, 5 cm the nipple, measuring 3.3 x 2.8 x 2.6 cm. In the 10:30 o'clock position of the left breast, 5 cm the nipple, there is irregular hypoechoic mass with somewhat more lobulated margins, corresponding to the lobulated mass seen mammographically. This measures 4.8 x 2.9 x 4.8 cm. Both masses show internal vascularity on color Doppler analysis.  Sonographic evaluation of the left  axilla shows several nodes with thickened cortices. Status cortex measured is 5 mm. None of these lymph nodes appear enlarged but overall size  criteria.  IMPRESSION: 1. Two masses in the left breast, 1 at the 2:30 o'clock position in the other at the 10:30 o'clock position, both highly suspicious for breast carcinoma. 2. Several abnormal left axillary lymph nodes with thickened cortices. 3. No evidence of right breast malignancy.       01/18/2017 Pathology Results    Diagnosis 1. Breast, left, needle core biopsy, 2:30 o'clock - INVASIVE MAMMARY CARCINOMA - MAMMARY CARCINOMA IN-SITU - SEE COMMENT 2. Lymph node, needle/core biopsy, left axilla - NO CARCINOMA IDENTIFIED - SEE COMMENT 3. Breast, left, needle core biopsy, 10:30 o'clock - INVASIVE DUCTAL CARCINOMA - SEE COMMENT  1. PROGNOSTIC INDICATORS Results: IMMUNOHISTOCHEMICAL AND MORPHOMETRIC ANALYSIS PERFORMED MANUALLY Estrogen Receptor: 100%, POSITIVE, STRONG STAINING INTENSITY Progesterone Receptor: 60%, POSITIVE, STRONG STAINING INTENSITY Proliferation Marker Ki67: 60%  1. FLUORESCENCE IN-SITU HYBRIDIZATION Results: HER2 - NEGATIVE RATIO OF HER2/CEP17 SIGNALS 1.28 AVERAGE HER2 COPY NUMBER PER CELL 2.75  3. PROGNOSTIC INDICATORS Results: IMMUNOHISTOCHEMICAL AND MORPHOMETRIC ANALYSIS PERFORMED MANUALLY Estrogen Receptor: 100%, POSITIVE, STRONG STAINING INTENSITY Progesterone Receptor: 50%, POSITIVE, STRONG STAINING INTENSITY Proliferation Marker Ki67: 70%  3. FLUORESCENCE IN-SITU HYBRIDIZATION Results: HER2 - **POSITIVE** RATIO OF HER2/CEP17 SIGNALS 2.80 AVERAGE HER2 COPY NUMBER PER CELL 6.15  Microscopic Comment 1. The biopsy material shows an infiltrative proliferation of cells with large vesicular nuclei with inconspicuous nucleoli, arranged linearly and in small clusters. Based on the biopsy, the carcinoma appears Nottingham grade 2 of 3 and measures 0.9 cm in greatest linear extent. E-cadherin and prognostic markers (ER/PR/ki-67/HER2-FISH)are pending and will be reported in an addendum. Dr. Lyndon Code reviewed the case and agrees with the above  diagnosis.      02/10/2017 Initial Diagnosis    Carcinoma of upper-outer quadrant of left breast in female, estrogen receptor positive ([place])      02/19/2017 Imaging    Bone Scan whole Body 02/19/17 IMPRESSION: Negative for evidence of bony metastatic disease.      02/19/2017 Imaging    CT CAP W Contrast 02/19/17 IMPRESSION: 1. Small right middle lobe pulmonary nodules up to 0.8 cm, nonspecific. Non-contrast chest CT at 3-6 months is recommended. If the nodules are stable at time of repeat CT, then future CT at 18-24 months (from today's scan) is considered optional for low-risk patients, but is recommended for high-risk patients. This recommendation follows the consensus statement: Guidelines for Management of Incidental Pulmonary Nodules Detected on CT Images: From the Fleischner Society 2017; Radiology 2017; 284:228-243. 2. Subcentimeter hepatic lesions likely cysts but incompletely characterized due to small size. Attention on follow-up imaging recommended. 3. 2.1 cm fusiform right common iliac artery aneurysm. Continued surveillance recommended. 4. Aortic Atherosclerosis (ICD10-I70.0) and Emphysema (ICD10-J43.9).      02/23/2017 Pathology Results    Diagnosis 1. Breast, simple mastectomy, Left Total - INVASIVE DUCTAL CARCINOMA, MULTIFOCAL, NOTTINGHAM GRADE 3/3 (5.3 CM, 3.5 CM) - INVASIVE CARCINOMA INVOLVES THE DERMIS - DUCTAL CARCINOMA IN SITU, INTERMEDIATE GRADE - HYALINIZED FIBROADENOMA - MARGINS UNINVOLVED BY CARCINOMA - NO CARCINOMA IDENTIFIED IN TWO LYMPH NODES (0/2) - SEE ONCOLOGY TABLE AND COMMENT BELOW 2. Lymph node, sentinel, biopsy, Left Axillary - NO CARCINOMA IDENTIFIED IN ONE LYMPH NODE (0/1) 3. Lymph node, sentinel, biopsy - NO CARCINOMA IDENTIFIED IN ONE LYMPH NODE (0/1) 4. Lymph node, sentinel, biopsy - NO CARCINOMA IDENTIFIED IN ONE LYMPH NODE (0/1) 5. Lymph node, sentinel, biopsy - NO CARCINOMA IDENTIFIED IN  ONE LYMPH NODE (0/1) Microscopic Comment 1.  BREAST, INVASIVE TUMOR Procedure: Simple mastectomy Laterality: Left Tumor Size: 5. 3 cm, 3.5 cm Histologic Type: Invasive carcinoma of no special type (ductal, not otherwise specified) Grade: Nottingham Grade 3 Tubular Differentiation: 3 Nuclear Pleomorphism: 3 Mitotic Count: 2 Ductal Carcinoma in Situ (DCIS): Present Extent of Tumor: Skin: Invasive carcinoma directly invades into the dermis or epidermis without skin ulceration Margins: Invasive carcinoma, distance from closest margin: 1.5 cm (posterior) DCIS, distance from closest margin: > 1 cm Regional Lymph Nodes: Number of Lymph Nodes Examined: 6 Number of Sentinel Lymph Nodes Examined: 4 Lymph Nodes with Macrometastases: 0 Lymph Nodes with Micrometastases: 0 Lymph Nodes with Isolated Tumor Cells: 0 Treatment effect: No known presurgical therapy Breast Prognostic Profile: See Also ([id number]) Estrogen Receptor: Positive (100%, strong); Positive (100%, strong) Progesterone Receptor: Positive (50%, strong); Positive (50%, strong) Her2: Positive (Ratio 2.80); Negative (Ratio 1.28) Ki-67: 70%; 60% Best tumor block for sendout testing: 1E Pathologic Stage Classification (pTNM, AJCC 8th Edition): Primary Tumor: mpT3 Regional Lymph Nodes: pN0 COMMENT: The two invasive carcinomas in the breast have slightly different morphologies. The larger lesion has a papillary and micropapillary pattern admixed with typical ductal carcinoma while the smaller lesion is more typical of a ductal lesion with linear arrays (E-cadherin performed on biopsy). There are foci within the larger lesion that are concerning for lymphovascular space invasion.       04/07/2017 -  Chemotherapy    weekly taxol and herceptin x12 weeks then herceptin q3 weeks for total 1 year       07/13/2017 -  Chemotherapy    The patient had ado-trastuzumab emtansine (KADCYLA) 260 mg in sodium chloride 0.9 % 250 mL chemo infusion, 3.6 mg/kg = 260 mg, Intravenous, Once, 1 of  5 cycles Administration: 260 mg (07/14/2017)  for chemotherapy treatment.      HISTORY OF PRESENTING ILLNESS:  Penny Huynh 84 y.o. female is here because of the diagnosed left breast cancer, she presents with her husband.  The patient self palpated a left breast mass around 6 months ago, she thought it was calcium deposit as her mother had a history of this.  There was no pain at first but has become intermittently tender which prompted her to seek medical attention with her PCP.  She reports the palpable mass nearly doubled in that time period. No associated breast swelling or nipple discharge.  She reports a decreased appetite lately, denies fatigue or weight loss.  Diagnostic imaging shows an irregular hypoechoic mass in the left breast at the 2:30 o'clock position, 5 cm from the nipple, measuring 3.3 x 2.8 x 2.6 cm.  In the 10:30 o'clock position of the left breast, 5 cm from the nipple, there is irregular hypoechoic mass measuring 4.8 x 2.9 x 4.8 cm.  Ultrasound of the left axilla shows cortical thickening but no enlarged lymph nodes.  Left breast mass at 2:30 o'clock position is positive for invasive mammary carcinoma with mammary carcinoma in situ, ER/PR positive, HER-2 negative; left breast mass at 10:30 o'clock position is positive for invasive ductal carcinoma, ER/PR positive, HER-2 positive.  The biopsied lymph node was negative for carcinoma.  She was then referred to [name] for surgical consult.  Today she reports feeling well with intermittent productive nagging cough with clear phlegm; no wheezing or dyspnea.  Has occasional burning in hands and feet but not affecting function, she attributes to long-term diabetes.  Denies breast or general pain, as well.  Denies  palpitations, chest pain, or lower extremity edema.  In the past she has history of knee pain, hypertension, hyperlipidemia, CAD, and DM.  In the 1960s she was told she had an enlarged heart but was never told she had a  diagnosis of heart failure, no MI or stroke.  She is followed by cardiologist Dr. Bronson Ing. Last echo in 2013 with EF 60-65% and grade I diastolic dysfunction.   She is retired from [employer] but has been recently enrolled in college courses to further her education. She previously exercised 6 days per week but decreased exercise over the last 6 months. She also works at [employer] during tax season. Lives at home with her spouse. She is independent of all ADLs, drives. She drinks alcohol occasionally; former smoker who quit in 2001, previously smoked 2 PPD x46 years.   GYN HISTORY  Menarchal: age [age] LMP: age 89 Contraceptive: none HRT: none GP: G2P0, has 2 grown (foster) children  CURRENT THERAPY:  weekly taxol and Herceptin x12 weeks beginning 04/07/17 then maintenance Kadcyla for total 1 year. Pending adjuvant radiation and anastrozole   INTERVAL HISTORY:  Penny Huynh returns for follow up and weekly Taxol and Herceptin. She presents to the clinic today by herself. She is doing well overall. She notes that her neuropathy has improved to her bilateral fingers. She denies pain. She still has neuropathy to her bilateral feet.   She is scheduled to start Radiation on 07/20/2017.    On review of systems, she reports left hand swelling, mildly improved, improving neuropathy. She is wearing a compression sleeve and glove today. She will follow up with her surgeon, [name] tomorrow. She is active and goes to church and exercises as well. she denies lack of appetite, and any other symptoms. Pertinent positives are listed and detailed within the above HPI.    REVIEW OF SYSTEMS:   Constitutional: Denies fatigue, fevers, chills or abnormal weight loss (+) good appetite Eyes: Denies blurriness of vision Ears, nose, mouth, throat, and face: Denies mucositis or sore throat (+) voice hoarseness Respiratory: Denies cough, dyspnea or wheezes Cardiovascular: Denies  palpitation, chest discomfort or lower extremity swelling Gastrointestinal:  Denies nausea, vomiting, diarrhea, heartburn  Skin: Denies rashes Lymphatics: Denies new lymphadenopathy or easy bruising Neurological: Denies new weaknesses  (+) peripheral neuropathy in her hands and feet, mildly improved.  Behavioral/Psych: Mood is stable, no new changes  Breast: (+) s/p left mastectomy  All other systems were reviewed with the patient and are negative.  MEDICAL HISTORY:  Past Medical History:  Diagnosis Date  . Arthritis    "maybe a little in my left knee" (02/23/2017)  . Asthmatic bronchitis   . Cancer of left breast ([place])   . Coronary atherosclerosis    Minor nonobstructive CAD at cardiac catheterization 2009  . Dyslipidemia   . Femoral bruit 7/08   With normal ABIs  . Hypertension   . Lower extremity edema    Chronic  . Pneumonia ~ 1950   in 8th grade  . Type II diabetes mellitus ([place])     SURGICAL HISTORY: Past Surgical History:  Procedure Laterality Date  . APPENDECTOMY    . BREAST BIOPSY Left 02/2017  . CARDIAC CATHETERIZATION     "long time ago" (02/23/2017)  . MASTECTOMY COMPLETE / SIMPLE W/ SENTINEL NODE BIOPSY Left 02/23/2017   TOTAL MATECTOMY;  LEFT AXILLARY SENTINEL LYMPH NODE BIOPSY ERAS PATHWAY  . MASTECTOMY WITH RADIOACTIVE SEED GUIDED EXCISION AND AXILLARY SENTINEL LYMPH NODE  BIOPSY Left 02/23/2017   Procedure: LEFT TOTAL MATECTOMY;  LEFT AXILLARY SENTINEL LYMPH NODE BIOPSY ERAS PATHWAY;  Surgeon: Excell Seltzer, MD;  Location: [facility];  Service: General;  Laterality: Left;  . PILONIDAL CYST EXCISION    . PORTA CATH INSERTION  02/23/2017  . PORTACATH PLACEMENT N/A 02/23/2017   Procedure: INSERTION PORT-A-CATH;  Surgeon: Excell Seltzer, MD;  Location: [facility];  Service: General;  Laterality: N/A;  . SHOULDER ARTHROSCOPY W/ ROTATOR CUFF REPAIR Right     I have reviewed the social history and family history with the patient and they are unchanged from previous  note.  ALLERGIES:  has No Known Allergies.  MEDICATIONS:  Current Outpatient Medications  Medication Sig Dispense Refill  . aspirin 81 MG EC tablet Take 81 mg by mouth at bedtime.     . chlorthalidone (HYGROTON) 25 MG tablet Take 25 mg by mouth daily.     . Coenzyme Q10 (COQ10) 100 MG CAPS Take 100 mg by mouth daily.      Marland Kitchen latanoprost (XALATAN) 0.005 % ophthalmic solution Place 1 drop into both eyes at bedtime.    Marland Kitchen losartan (COZAAR) 50 MG tablet Take 50 mg by mouth daily.    . metFORMIN (GLUCOPHAGE) 500 MG tablet Take 1,000 mg by mouth daily with breakfast.     . traMADol (ULTRAM) 50 MG tablet Take 1 tablet (50 mg total) by mouth every 6 (six) hours as needed (mild pain). (Patient not taking: Reported on 07/14/2017) 15 tablet 0  . vitamin B-12 (CYANOCOBALAMIN) 1000 MCG tablet Take 1,000 mcg by mouth daily.     No current facility-administered medications for this visit.     PHYSICAL EXAMINATION:  ECOG PERFORMANCE STATUS: 1 - Symptomatic but completely ambulatory  Vitals:   07/14/17 1124  BP: (!) 198/79  Pulse: 70  Resp: 17  Temp: 98.3 F (36.8 C)  SpO2: 100%   Filed Weights   07/14/17 1124  Weight: 156 lb 4.8 oz (70.9 kg)    GENERAL:alert, no distress and comfortable SKIN: skin color, texture, turgor are normal   EYES: normal, Conjunctiva are pink and non-injected, sclera clear OROPHARYNX:no exudate, no erythema and lips, buccal mucosa, and tongue normal  NECK: supple, thyroid normal size, non-tender, without nodularity LYMPH:  no palpable cervical, supraclavicular, or axillary lymphadenopathy  LUNGS: clear to auscultation bilaterally with normal breathing effort HEART: regular rate & rhythm and no murmurs and no lower extremity edema ABDOMEN:abdomen soft, non-tender and normal bowel sounds Musculoskeletal:no cyanosis of digits and no clubbing  NEURO: alert & oriented x 3 with fluent speech, no focal motor/sensory deficits BREASTS: s/p left mastectomy, surgical  incision healing well PAC without erythema   LABORATORY DATA:  I have reviewed the data as listed CBC Latest Ref Rng & Units 07/14/2017 06/23/2017 06/16/2017  WBC 3.9 - 10.3 K/uL 3.3(L) 3.6(L) 3.8(L)  Hemoglobin 11.6 - 15.9 g/dL 10.8(L) 10.0(L) 9.9(L)  Hematocrit 34.8 - 46.6 % 32.9(L) 29.7(L) 29.3(L)  Platelets 145 - 400 K/uL 233 297 316     CMP Latest Ref Rng & Units 07/14/2017 06/23/2017 06/16/2017  Glucose 70 - 140 mg/dL 85 75 133  BUN 7 - 26 mg/dL _0 Creatinine 0.60 - 1.10 mg/dL 0.71 0.69 0.77  Sodium 136 - 145 mmol/L 130(L) 131(L) 129(L)  Potassium 3.5 - 5.1 mmol/L 3.6 4.0 3.5  Chloride 98 - 109 mmol/L 94(L) 97(L) 95(L)  CO2 22 - 29 mmol/L _1 Calcium 8.4 - 10.4 mg/dL 9.5 9.3 9.2  Total Protein 6.4 - 8.3 g/dL 6.6 6.5 6.4  Total Bilirubin 0.2 - 1.2 mg/dL 0.7 0.5 0.5  Alkaline Phos 40 - 150 U/L 53 52 50  AST 5 - 34 U/L _0 ALT 0 - 55 U/L _1 ANC 1.4K today   PATHOLOGY  LEFT SIMPLE MASTECTOMY WITH SNLB 02/23/17 per [name] Diagnosis 1. Breast, simple mastectomy, Left Total - INVASIVE DUCTAL CARCINOMA, MULTIFOCAL, NOTTINGHAM GRADE 3/3 (5.3 CM, 3.5 CM) - INVASIVE CARCINOMA INVOLVES THE DERMIS - DUCTAL CARCINOMA IN SITU, INTERMEDIATE GRADE - HYALINIZED FIBROADENOMA - MARGINS UNINVOLVED BY CARCINOMA - NO CARCINOMA IDENTIFIED IN TWO LYMPH NODES (0/2) - SEE ONCOLOGY TABLE AND COMMENT BELOW 2. Lymph node, sentinel, biopsy, Left Axillary - NO CARCINOMA IDENTIFIED IN ONE LYMPH NODE (0/1) 3. Lymph node, sentinel, biopsy - NO CARCINOMA IDENTIFIED IN ONE LYMPH NODE (0/1) 4. Lymph node, sentinel, biopsy - NO CARCINOMA IDENTIFIED IN ONE LYMPH NODE (0/1) 5. Lymph node, sentinel, biopsy - NO CARCINOMA IDENTIFIED IN ONE LYMPH NODE (0/1) Microscopic Comment 1. BREAST, INVASIVE TUMOR Procedure: Simple mastectomy Laterality: Left Tumor Size: 5. 3 cm, 3.5 cm Histologic Type: Invasive carcinoma of no special type (ductal, not otherwise specified) Grade:  Nottingham Grade 3 Tubular Differentiation: 3 Nuclear Pleomorphism: 3 Mitotic Count: 2 Ductal Carcinoma in Situ (DCIS): Present Extent of Tumor: Skin: Invasive carcinoma directly invades into the dermis or epidermis without skin ulceration Margins: Invasive carcinoma, distance from closest margin: 1.5 cm (posterior) DCIS, distance from closest margin: > 1 cm Regional Lymph Nodes: Number of Lymph Nodes Examined: 6 Number of Sentinel Lymph Nodes Examined: 4 Lymph Nodes with Macrometastases: 0 Lymph Nodes with Micrometastases: 0 Lymph Nodes with Isolated Tumor Cells: 0 Treatment effect: No known presurgical therapy Breast Prognostic Profile: See Also ([id number]) Estrogen Receptor: Positive (100%, strong); Positive (100%, strong) Progesterone Receptor: Positive (50%, strong); Positive (50%, strong) Her2: Positive (Ratio 2.80); Negative (Ratio 1.28) Ki-67: 70%; 60% Best tumor block for sendout testing: 1E Pathologic Stage Classification (pTNM, AJCC 8th Edition): Primary Tumor: mpT3 Regional Lymph Nodes: pN0 COMMENT: The two invasive carcinomas in the breast have slightly different morphologies. The larger lesion has a papillary and micropapillary pattern admixed with typical ductal carcinoma while the smaller lesion is more typical of a ductal lesion with linear arrays (E-cadherin performed on biopsy). There are foci within the larger lesion that are concerning for lymphovascular space invasion.   02/17/17 ECHO: Study Conclusions - Left ventricle: The cavity size was normal. Wall thickness was   increased in a pattern of mild LVH. Systolic function was normal.   The estimated ejection fraction was in the range of 55% to 60%.   Normal GLS at -20%. Wall motion was normal; there were no   regional wall motion abnormalities. Doppler parameters are   consistent with abnormal left ventricular relaxation (grade 1   diastolic dysfunction). The E/e&' ratio is between 8-15,   suggesting  indeterminate LV filling pressure. - Aortic valve: Sclerosis without stenosis. - Aorta: Mildly dilated aorta. Aortic root dimension: 39 mm (ED).   Ascending aortic diameter: 38 mm (S). - Left atrium: The atrium was normal in size. - Inferior vena cava: The vessel was normal in size. The   respirophasic diameter changes were in the normal range (>= 50%),   consistent with normal central venous pressure. Impressions: - Compared to a prior study in 2013, the LVEF is slightly lower but   normal at 55-60%. There is mild LVH, normal GLS at -20%,  grade 1   DD and indeterminate LV filling pressure. The aorta is mildly   dilated at 3.8-3.9 cm.   RADIOGRAPHIC STUDIES: I have personally reviewed the radiological images as listed and agreed with the findings in the report. No results found.    Bone Scan whole Body 02/19/17 IMPRESSION: Negative for evidence of bony metastatic disease.   CT CAP W Contrast 02/19/17 IMPRESSION: 1. Small right middle lobe pulmonary nodules up to 0.8 cm, nonspecific. Non-contrast chest CT at 3-6 months is recommended. If the nodules are stable at time of repeat CT, then future CT at 18-24 months (from today's scan) is considered optional for low-risk patients, but is recommended for high-risk patients. This recommendation follows the consensus statement: Guidelines for Management of Incidental Pulmonary Nodules Detected on CT Images: From the Fleischner Society 2017; Radiology 2017; 284:228-243. 2. Subcentimeter hepatic lesions likely cysts but incompletely characterized due to small size. Attention on follow-up imaging recommended. 3. 2.1 cm fusiform right common iliac artery aneurysm. Continued surveillance recommended. 4. Aortic Atherosclerosis (ICD10-I70.0) and Emphysema (ICD10-J43.9).   ASSESSMENT & PLAN: Penny Huynh is an 84 y.o. female with past medical history positive for HTN, DM, CAD now with newly diagnosed left breast cancer  1.  Multifocal  invasive ductal carcinoma of the left breast of female; carcinoma of the upper-outer quadrant of left breast and female, ER/PR positive, HER-2 negative pT2N0M0, G2, Stage 1B; IDC of the upper-inner quadrant left breast, ER+/PR+/HER2+, pT3N0M0 G3, stage IB -Due to her HER2 positive disease, obtained a CT CAP and bone scan to complete staging work up on 02/19/17 which was negative for distant  metastasis.   -We previously reviewed her pathology results in detail, she had multifocal invasive ductal carcinoma, G3, the upper inner quadrant focus spanning 5.3 cm, ER+/PR+/HER2+; the upper outer quadrant focus spanned 3.5 cm, ER+/PR+/HER2- node-negative.  -We previously discussed the high risk of recurrence due to HER-2 positive T3 disease, I recommended her to consider adjuvant chemotherapy.  Due to her advanced age, will recommend weekly Taxol and Herceptin, followed by Herceptin maintenance therapy for a total of 1 year. She began this regimen on 04/07/17. She has been tolerating treatment well overall.  He has completed Taxol therapy. -based on recent adjuvant T-DM1 data, I recommend her to switch Herceptin maintenance to Kadcyla maintenance, to complete 1 year therapy.  She agrees. -She will start adjuvant radiation soon. -I discussed with the patient regarding anastrozole use today and that we will discuss this further after she completes radiation.   -She is due to start radiation therapy with Dr. Lisbeth Renshaw on 07/20/2017.  -F/u as scheduled in 3 weeks with Lacie.   2. HTN, CAD, DM -continue chlorthalidone and losartan for BP, metformin for DM -We previously reviewed DM can worsen while on chemotherapy especially with steroid administration; will monitor closely.  -I previously recommended she eat well and be active within limitations of post-op recovery.  -She is taking her losartan and hygroton for her HTN.  -Due to the patient elevated blood pressure readings, I advised the patient to call her PCP, Dr. [name]. She agrees to call her PCP today.   -I advised the patient to maintain follow up with her PCP regarding her HTN.  3.  Hyponatremia -Na 126 on 04/14/17, does usually run low in low 130's.  Not on SSRI.  On chlorthalidone diuretic for HTN. Lacie recommended she hold this medication and increase salt intake.  She will call PCP to see if adjustment needs to  be made in losartan while off diuretic. Encouraged to monitor her BP. She is asymptomatic; will monitor closely.  -Lacie held chlorthalidone on 05/26/17  5. Peripheral neuropathy, G1, secondary to taxol and DM -She has progressive peripheral neuropathy to her feet, increasing in a "stocking" distribution, related to DM and worsened on chemotherapy. -She will use cryotherapy to her feet during taxol infusion, and it was recommended she take B complex vitamin for nerve health. -Taxol dose reduced on 05/26/17.  -Her neuropathy is worse on her feet than her hands. I previously advised her to ice her hands and start icing her feet during chemo. -Taxol dose further reduced with Cycle 9 on 06/09/17    PLAN: -Lab reviewed, adequate for treatment.  Will proceed first cycle Kadcyla today and continue every 3 weeks.   -F/u as scheduled in 3 weeks with Lacie.  -She will start radiation on July 20, 2017     All questions were answered. The patient knows to call the clinic with any problems, questions or concerns. No barriers to learning was detected.  I spent 20 minutes counseling the patient face to face. The total time spent in the appointment was 25 minutes and more than 50% was on counseling and review of test results  I, Soijett Blue am acting as scribe for Dr. Truitt Merle.  I have reviewed the above documentation for accuracy and completeness, and I agree with the above.    Truitt Merle, MD 07/14/2017

## 2017-07-13 NOTE — Therapy (Signed)
[place], [place], [address] Phone: [phone]   Fax:  [phone]  Physical Therapy Treatment  Patient Details  Name: Penny Huynh MRN: [id number] Date of Birth: [birth year] Referring Provider: [name]   Encounter Date: 07/13/2017  PT End of Session - 07/13/17 1339    Visit Number  64 KX    Number of Visits  27    Date for PT Re-Evaluation  07/23/17    PT Start Time  9163    PT Stop Time  1347    PT Time Calculation (min)  42 min    Activity Tolerance  Patient tolerated treatment well    Behavior During Therapy  [facility] for tasks assessed/performed       Past Medical History:  Diagnosis Date  . Arthritis    "maybe a little in my left knee" (02/23/2017)  . Asthmatic bronchitis   . Cancer of left breast ([place])   . Coronary atherosclerosis    Minor nonobstructive CAD at cardiac catheterization 2009  . Dyslipidemia   . Femoral bruit 7/08   With normal ABIs  . Hypertension   . Lower extremity edema    Chronic  . Pneumonia ~ 1950   in 8th grade  . Type II diabetes mellitus ([place])     Past Surgical History:  Procedure Laterality Date  . APPENDECTOMY    . BREAST BIOPSY Left 02/2017  . CARDIAC CATHETERIZATION     "long time ago" (02/23/2017)  . MASTECTOMY COMPLETE / SIMPLE W/ SENTINEL NODE BIOPSY Left 02/23/2017   TOTAL MATECTOMY;  LEFT AXILLARY SENTINEL LYMPH NODE BIOPSY ERAS PATHWAY  . MASTECTOMY WITH RADIOACTIVE SEED GUIDED EXCISION AND AXILLARY SENTINEL LYMPH NODE BIOPSY Left 02/23/2017   Procedure: LEFT TOTAL MATECTOMY;  LEFT AXILLARY SENTINEL LYMPH NODE BIOPSY ERAS PATHWAY;  Surgeon: Excell Seltzer, MD;  Location: [facility];  Service: General;  Laterality: Left;  . PILONIDAL CYST EXCISION    . PORTA CATH INSERTION  02/23/2017  . PORTACATH PLACEMENT N/A 02/23/2017   Procedure: INSERTION PORT-A-CATH;  Surgeon: Excell Seltzer, MD;  Location: [facility];  Service: General;   Laterality: N/A;  . SHOULDER ARTHROSCOPY W/ ROTATOR CUFF REPAIR Right     There were no vitals filed for this visit.  Subjective Assessment - 07/13/17 1308    Subjective  I can tell my shoulder is doing better. I've been doing the exercises with the bands and dowel. My balance is also improving. I've had a few spots of tendernes right below my mastectomy incision the past few days and I've felt a little more swollen there as well past few days. I see Dr.Hoxworth tomorrow and plan to address it with him.     Pertinent History  Patient underwent a left mastectomy and sentinel node biopsy (6 negative nodes) on 02/23/17. She will begin chemotherapy 03/31/17 as long as drain is removed today. She has 2 different breast cancers. Both are ER/PR positive but one is HER2 negative and one is HER2 positive. History of right shoulder scope 20 years ago and has diabetes and hypertension, both of which are well controlled.    Patient Stated Goals  See if my arm is ok    Currently in Pain?  No/denies                       [facility] Adult PT Treatment/Exercise - 07/13/17 0001      High Level Balance   High Level  Balance Comments  In hall with fingertips along wall: braiding bil 2 times each; heel-toe 4 times with VCs for slow, controlled pace; then slow, high knee marching 2 times with cuing for slower pacing. SBA-CGA required throughout, especially with braiding      Knee/Hip Exercises: Standing   Hip Flexion  Stengthening;Right;Left;10 reps;Knee straight fingertips on back of bike; keeping swinging leg off ground    Hip Abduction  Stengthening;Right;Left    Hip Extension  Stengthening;Right;Left;10 reps    Extension Limitations  VCs and ddemonstration for core engaged to decrease lumbar compensation (extension)      Shoulder Exercises: Pulleys   ABduction  2 minutes    ABduction Limitations  Pt returned therapist demonstration with VCs to relax Lt shoulder      Shoulder Exercises: Therapy  Ball   Flexion  Both;10 reps Forward lean into end of stretch      Manual Therapy   Soft tissue mobilization  to cording which is visible and palpable at left axilla today; around left mastectomy site with movement in all directions    Myofascial Release  crosshands across left axilla; left UE myofascial pulling; crosshands from left upper arm to mid-abdomen in diagonal across incision area    Manual Lymphatic Drainage (MLD)  Briefly to Lt lateral trunk: Short neck, 5 diaphragmatic breaths, Lt inguinal nodes, and Lt axillo-inguinal anastomosis focusing at area of swellin    Passive ROM  in supine to left shoulder for er, flexion, and abduction, prolonged stretches; also into position for radiation             PT Education - 07/13/17 1324    Education provided  Yes    Education Details  Hip 3 way with keeping swinging leg off ground for SLS on opposite LE with fingertips on counter    Person(s) Educated  Patient    Methods  Explanation;Demonstration;Handout    Comprehension  Verbalized understanding;Returned demonstration;Need further instruction          PT Long Term Goals - 07/13/17 1319      PT LONG TERM GOAL #1   Title  Patient will be independent in her home exercise program for shoulder ROM.    Status  Achieved      PT LONG TERM GOAL #2   Title  Patient will demonstrate shoulder flexion AROM to be >/= 140 degrees for increased ease reaching overhead.    Baseline  65 degrees; 131 degrees - 04/13/17, 04/19/17- 130; 141 on 05/17/17; 148 degrees-07/13/17    Status  Achieved      PT LONG TERM GOAL #3   Title  Patient will demonstrate shoulder abduction AROM to be >/= 140 degrees for increased ease reaching overhead.    Baseline  52 degrees; 129 degrees - 04/13/17, 04/19/17- 155; 156 degrees-07/13/17    Status  Achieved      PT LONG TERM GOAL #4   Title  Patient will score </= 10 on the DASH indicating increased shoulder function.    Baseline  54.55, 04/19/17- 18.18; 9.09 on  06/04/17 with patient answering 10 of 11 questions    Status  Achieved      PT LONG TERM GOAL #5   Title  Patient will verbalize she understands lymphedema risk reduction.    Baseline  04/19/17- re educated pt in risk reduction handout; gave handout on 05/17/17    Status  Achieved      PT LONG TERM GOAL #6   Title  Left shoulder abduction  to at least 160 degrees    Baseline  146 on 06/18/17; 156 degrees-07/13/17    Status  Partially Met      PT LONG TERM GOAL #7   Title  Pt. will be independent with home exercise for balance.    Status  On-going      Breast Clinic Goals - 02/08/17 2024      Patient will be able to verbalize understanding of pertinent lymphedema risk reduction practices relevant to her diagnosis specifically related to skin care.   Status  Achieved      Patient will be able to return demonstrate and/or verbalize understanding of the post-op home exercise program related to regaining shoulder range of motion.   Status  Achieved      Patient will be able to verbalize understanding of the importance of attending the postoperative After Breast Cancer Class for further lymphedema risk reduction education and therapeutic exercise.   Status  Achieved           Plan - 07/13/17 1348    Clinical Impression Statement  Pt continues to seem to improve with her gait and reporting the same. Cording less palpable today and pt did not have any tenderness today either. Progressed her HEP to include standing hip 3 way raises which she tolerated wel though did require VCs fo each for correct technique and to decrease compensations.     Rehab Potential  Excellent    PT Frequency  2x / week    PT Duration  4 weeks    PT Treatment/Interventions  ADLs/Self Care Home Management;Therapeutic exercise;Patient/family education;Orthotic Fit/Training;Manual techniques;Passive range of motion;Manual lymph drainage;Scar mobilization;DME Instruction    PT Next Visit Plan  Continue to include balance  exercises; consider another balance assessment (besides Berg, on which she did well), such as DGI. Continue manual work on left chest.     Consulted and Agree with Plan of Care  Patient       Patient will benefit from skilled therapeutic intervention in order to improve the following deficits and impairments:  Postural dysfunction, Decreased knowledge of precautions, Decreased knowledge of use of DME, Impaired UE functional use, Decreased range of motion, Decreased strength, Increased fascial restricitons, Decreased scar mobility  Visit Diagnosis: Aftercare following surgery for neoplasm  Stiffness of left shoulder, not elsewhere classified  Abnormal posture  Unsteadiness on feet     Problem List Patient Active Problem List   Diagnosis Date Noted  . Port-A-Cath in place 04/14/2017  . Stage II breast cancer, left ([place]) 02/23/2017  . Carcinoma of upper-outer quadrant of left breast in female, estrogen receptor positive ([place]) 02/10/2017  . Carcinoma of upper-inner quadrant of left breast in female, estrogen receptor positive ([place]) 02/10/2017  . Chest pain 07/18/2015  . Hypokalemia 07/18/2015  . Vertigo 07/18/2015  . LBBB (left bundle branch block) 05/28/2009  . AODM 12/21/2007  . Essential hypertension, benign 12/21/2007  . DIASTOLIC DYSFUNCTION 68/01/7516  . FEMORAL BRUIT, RIGHT 12/21/2007  . DYSPNEA 12/21/2007    Otelia Limes, PTA 07/13/2017, 1:52 PM  [place], [place], [address] Phone: [phone]   Fax:  [phone]  Name: Penny Huynh MRN: [id number] Date of Birth: 19-Sep-1933

## 2017-07-14 ENCOUNTER — Inpatient Hospital Stay: Payer: Medicare Other | Attending: Nurse Practitioner

## 2017-07-14 ENCOUNTER — Inpatient Hospital Stay: Payer: Medicare Other

## 2017-07-14 ENCOUNTER — Telehealth: Payer: Self-pay

## 2017-07-14 ENCOUNTER — Encounter: Payer: Self-pay | Admitting: Hematology

## 2017-07-14 ENCOUNTER — Inpatient Hospital Stay (HOSPITAL_BASED_OUTPATIENT_CLINIC_OR_DEPARTMENT_OTHER): Payer: Medicare Other | Admitting: Hematology

## 2017-07-14 ENCOUNTER — Telehealth: Payer: Self-pay | Admitting: Hematology

## 2017-07-14 VITALS — BP 173/72 | HR 64 | Temp 98.1°F | Resp 16

## 2017-07-14 VITALS — BP 198/79 | HR 70 | Temp 98.3°F | Resp 17 | Ht 64.5 in | Wt 156.3 lb

## 2017-07-14 DIAGNOSIS — Z170 Estrogen receptor positive status [ER+]: Secondary | ICD-10-CM | POA: Diagnosis not present

## 2017-07-14 DIAGNOSIS — I2510 Atherosclerotic heart disease of native coronary artery without angina pectoris: Secondary | ICD-10-CM | POA: Diagnosis not present

## 2017-07-14 DIAGNOSIS — Z87891 Personal history of nicotine dependence: Secondary | ICD-10-CM | POA: Diagnosis not present

## 2017-07-14 DIAGNOSIS — C50212 Malignant neoplasm of upper-inner quadrant of left female breast: Secondary | ICD-10-CM | POA: Insufficient documentation

## 2017-07-14 DIAGNOSIS — E871 Hypo-osmolality and hyponatremia: Secondary | ICD-10-CM | POA: Diagnosis not present

## 2017-07-14 DIAGNOSIS — Z79899 Other long term (current) drug therapy: Secondary | ICD-10-CM | POA: Diagnosis not present

## 2017-07-14 DIAGNOSIS — I10 Essential (primary) hypertension: Secondary | ICD-10-CM | POA: Diagnosis not present

## 2017-07-14 DIAGNOSIS — Z7982 Long term (current) use of aspirin: Secondary | ICD-10-CM | POA: Insufficient documentation

## 2017-07-14 DIAGNOSIS — E119 Type 2 diabetes mellitus without complications: Secondary | ICD-10-CM | POA: Insufficient documentation

## 2017-07-14 DIAGNOSIS — Z794 Long term (current) use of insulin: Secondary | ICD-10-CM | POA: Insufficient documentation

## 2017-07-14 DIAGNOSIS — C50412 Malignant neoplasm of upper-outer quadrant of left female breast: Secondary | ICD-10-CM

## 2017-07-14 DIAGNOSIS — E785 Hyperlipidemia, unspecified: Secondary | ICD-10-CM | POA: Insufficient documentation

## 2017-07-14 DIAGNOSIS — G629 Polyneuropathy, unspecified: Secondary | ICD-10-CM | POA: Diagnosis not present

## 2017-07-14 DIAGNOSIS — Z5111 Encounter for antineoplastic chemotherapy: Secondary | ICD-10-CM | POA: Diagnosis not present

## 2017-07-14 DIAGNOSIS — Z510 Encounter for antineoplastic radiation therapy: Secondary | ICD-10-CM | POA: Diagnosis not present

## 2017-07-14 DIAGNOSIS — Z95828 Presence of other vascular implants and grafts: Secondary | ICD-10-CM

## 2017-07-14 LAB — CMP (CANCER CENTER ONLY)
ALBUMIN: 3.8 g/dL (ref 3.5–5.0)
ALK PHOS: 53 U/L (ref 40–150)
ALT: 9 U/L (ref 0–55)
AST: 18 U/L (ref 5–34)
Anion gap: 7 (ref 3–11)
BILIRUBIN TOTAL: 0.7 mg/dL (ref 0.2–1.2)
BUN: 10 mg/dL (ref 7–26)
CALCIUM: 9.5 mg/dL (ref 8.4–10.4)
CO2: 29 mmol/L (ref 22–29)
Chloride: 94 mmol/L — ABNORMAL LOW (ref 98–109)
Creatinine: 0.71 mg/dL (ref 0.60–1.10)
GFR, Est AFR Am: >60 mL/min (ref >60)
GFR, Estimated: >60 mL/min (ref >60)
GLUCOSE: 85 mg/dL (ref 70–140)
Potassium: 3.6 mmol/L (ref 3.5–5.1)
Sodium: 130 mmol/L — ABNORMAL LOW (ref 136–145)
TOTAL PROTEIN: 6.6 g/dL (ref 6.4–8.3)

## 2017-07-14 LAB — CBC WITH DIFFERENTIAL (CANCER CENTER ONLY)
BASOS ABS: 0 10*3/uL (ref 0.0–0.1)
BASOS PCT: 1 %
Eosinophils Absolute: 0.1 10*3/uL (ref 0.0–0.5)
Eosinophils Relative: 4 %
HEMATOCRIT: 32.9 % — AB (ref 34.8–46.6)
HEMOGLOBIN: 10.8 g/dL — AB (ref 11.6–15.9)
Lymphocytes Relative: 32 %
Lymphs Abs: 1.1 10*3/uL (ref 0.9–3.3)
MCH: 30.5 pg (ref 25.1–34.0)
MCHC: 32.8 g/dL (ref 31.5–36.0)
MCV: 93 fL (ref 79.5–101.0)
MONOS PCT: 10 %
Monocytes Absolute: 0.3 10*3/uL (ref 0.1–0.9)
NEUTROS ABS: 1.7 10*3/uL (ref 1.5–6.5)
NEUTROS PCT: 53 %
Platelet Count: 233 10*3/uL (ref 145–400)
RBC: 3.53 MIL/uL — ABNORMAL LOW (ref 3.70–5.45)
RDW: 15 % — ABNORMAL HIGH (ref 11.2–14.5)
WBC Count: 3.3 10*3/uL — ABNORMAL LOW (ref 3.9–10.3)

## 2017-07-14 MED ORDER — ACETAMINOPHEN 325 MG PO TABS
ORAL_TABLET | ORAL | Status: AC
  Filled 2017-07-14: qty 2

## 2017-07-14 MED ORDER — SODIUM CHLORIDE 0.9 % IV SOLN
Freq: Once | INTRAVENOUS | Status: AC
  Administered 2017-07-14: 14:00:00 via INTRAVENOUS

## 2017-07-14 MED ORDER — DIPHENHYDRAMINE HCL 25 MG PO CAPS
ORAL_CAPSULE | ORAL | Status: AC
  Filled 2017-07-14: qty 2

## 2017-07-14 MED ORDER — SODIUM CHLORIDE 0.9% FLUSH
10.0000 mL | INTRAVENOUS | Status: DC | PRN
  Administered 2017-07-14: 10 mL
  Filled 2017-07-14: qty 10

## 2017-07-14 MED ORDER — ADO-TRASTUZUMAB EMTANSINE CHEMO INJECTION 160 MG
3.6000 mg/kg | Freq: Once | INTRAVENOUS | Status: AC
  Administered 2017-07-14: 260 mg via INTRAVENOUS
  Filled 2017-07-14: qty 8

## 2017-07-14 MED ORDER — DIPHENHYDRAMINE HCL 25 MG PO CAPS
50.0000 mg | ORAL_CAPSULE | Freq: Once | ORAL | Status: AC
  Administered 2017-07-14: 50 mg via ORAL

## 2017-07-14 MED ORDER — HEPARIN SOD (PORK) LOCK FLUSH 100 UNIT/ML IV SOLN
500.0000 [IU] | Freq: Once | INTRAVENOUS | Status: AC | PRN
  Administered 2017-07-14: 500 [IU]
  Filled 2017-07-14: qty 5

## 2017-07-14 MED ORDER — ACETAMINOPHEN 325 MG PO TABS
650.0000 mg | ORAL_TABLET | Freq: Once | ORAL | Status: AC
  Administered 2017-07-14: 650 mg via ORAL

## 2017-07-14 NOTE — Patient Instructions (Signed)
Implanted Port Home Guide An implanted port is a type of central line that is placed under the skin. Central lines are used to provide IV access when treatment or nutrition needs to be given through a person's veins. Implanted ports are used for long-term IV access. An implanted port may be placed because:  You need IV medicine that would be irritating to the small veins in your hands or arms.  You need long-term IV medicines, such as antibiotics.  You need IV nutrition for a long period.  You need frequent blood draws for lab tests.  You need dialysis.  Implanted ports are usually placed in the chest area, but they can also be placed in the upper arm, the abdomen, or the leg. An implanted port has two main parts:  Reservoir. The reservoir is round and will appear as a small, raised area under your skin. The reservoir is the part where a needle is inserted to give medicines or draw blood.  Catheter. The catheter is a thin, flexible tube that extends from the reservoir. The catheter is placed into a large vein. Medicine that is inserted into the reservoir goes into the catheter and then into the vein.  How will I care for my incision site? Do not get the incision site wet. Bathe or shower as directed by your health care provider. How is my port accessed? Special steps must be taken to access the port:  Before the port is accessed, a numbing cream can be placed on the skin. This helps numb the skin over the port site.  Your health care provider uses a sterile technique to access the port. ? Your health care provider must put on a mask and sterile gloves. ? The skin over your port is cleaned carefully with an antiseptic and allowed to dry. ? The port is gently pinched between sterile gloves, and a needle is inserted into the port.  Only "non-coring" port needles should be used to access the port. Once the port is accessed, a blood return should be checked. This helps ensure that the port  is in the vein and is not clogged.  If your port needs to remain accessed for a constant infusion, a clear (transparent) bandage will be placed over the needle site. The bandage and needle will need to be changed every week, or as directed by your health care provider.  Keep the bandage covering the needle clean and dry. Do not get it wet. Follow your health care provider's instructions on how to take a shower or bath while the port is accessed.  If your port does not need to stay accessed, no bandage is needed over the port.  What is flushing? Flushing helps keep the port from getting clogged. Follow your health care provider's instructions on how and when to flush the port. Ports are usually flushed with saline solution or a medicine called heparin. The need for flushing will depend on how the port is used.  If the port is used for intermittent medicines or blood draws, the port will need to be flushed: ? After medicines have been given. ? After blood has been drawn. ? As part of routine maintenance.  If a constant infusion is running, the port may not need to be flushed.  How long will my port stay implanted? The port can stay in for as long as your health care provider thinks it is needed. When it is time for the port to come out, surgery will be   done to remove it. The procedure is similar to the one performed when the port was put in. When should I seek immediate medical care? When you have an implanted port, you should seek immediate medical care if:  You notice a bad smell coming from the incision site.  You have swelling, redness, or drainage at the incision site.  You have more swelling or pain at the port site or the surrounding area.  You have a fever that is not controlled with medicine.  This information is not intended to replace advice given to you by your health care provider. Make sure you discuss any questions you have with your health care provider. Document  Released: 01/19/2005 Document Revised: 06/27/2015 Document Reviewed: 09/26/2012 Elsevier Interactive Patient Education  2017 Elsevier Inc.  

## 2017-07-14 NOTE — Patient Instructions (Addendum)
[facility] Discharge Instructions for Patients Receiving Chemotherapy  Today you received the following chemotherapy agents: Kadcyla.  To help prevent nausea and vomiting after your treatment, we encourage you to take your nausea medication as directed.   If you develop nausea and vomiting that is not controlled by your nausea medication, call the clinic.   BELOW ARE SYMPTOMS THAT SHOULD BE REPORTED IMMEDIATELY:  *FEVER GREATER THAN 100.5 F  *CHILLS WITH OR WITHOUT FEVER  NAUSEA AND VOMITING THAT IS NOT CONTROLLED WITH YOUR NAUSEA MEDICATION  *UNUSUAL SHORTNESS OF BREATH  *UNUSUAL BRUISING OR BLEEDING  TENDERNESS IN MOUTH AND THROAT WITH OR WITHOUT PRESENCE OF ULCERS  *URINARY PROBLEMS  *BOWEL PROBLEMS  UNUSUAL RASH Items with * indicate a potential emergency and should be followed up as soon as possible.  Feel free to call the clinic should you have any questions or concerns. The clinic phone number is [phone].  Please show the CHEMO ALERT CARD at check-in to the Emergency Department and triage nurse. Ado-Trastuzumab Emtansine for injection What is this medicine? ADO-TRASTUZUMAB EMTANSINE (ADD oh traz TOO zuh mab em TAN zine) is a monoclonal antibody combined with chemotherapy. It is used to treat breast cancer. This medicine may be used for other purposes; ask your health care provider or pharmacist if you have questions. COMMON BRAND NAME(S): Kadcyla What should I tell my health care provider before I take this medicine? They need to know if you have any of these conditions: -heart disease -heart failure -infection (especially a virus infection such as chickenpox, cold sores, or herpes) -liver disease -lung or breathing disease, like asthma -an unusual or allergic reaction to ado-trastuzumab emtansine, other medications, foods, dyes, or preservatives -pregnant or trying to get pregnant -breast-feeding How should I use this medicine? This  medicine is for infusion into a vein. It is given by a health care professional in a hospital or clinic setting. Talk to your pediatrician regarding the use of this medicine in children. Special care may be needed. Overdosage: If you think you have taken too much of this medicine contact a poison control center or emergency room at once. NOTE: This medicine is only for you. Do not share this medicine with others. What if I miss a dose? It is important not to miss your dose. Call your doctor or health care professional if you are unable to keep an appointment. What may interact with this medicine? This medicine may also interact with the following medications: -atazanavir -boceprevir -clarithromycin -delavirdine -indinavir -dalfopristin; quinupristin -isoniazid, INH -itraconazole -ketoconazole -nefazodone -nelfinavir -ritonavir -telaprevir -telithromycin -tipranavir -voriconazole This list may not describe all possible interactions. Give your health care provider a list of all the medicines, herbs, non-prescription drugs, or dietary supplements you use. Also tell them if you smoke, drink alcohol, or use illegal drugs. Some items may interact with your medicine. What should I watch for while using this medicine? Visit your doctor for checks on your progress. This drug may make you feel generally unwell. This is not uncommon, as chemotherapy can affect healthy cells as well as cancer cells. Report any side effects. Continue your course of treatment even though you feel ill unless your doctor tells you to stop. You may need blood work done while you are taking this medicine. Call your doctor or health care professional for advice if you get a fever, chills or sore throat, or other symptoms of a cold or flu. Do not treat yourself. This drug decreases your body's ability   to fight infections. Try to avoid being around people who are sick. Be careful brushing and flossing your teeth or using a  toothpick because you may get an infection or bleed more easily. If you have any dental work done, tell your dentist you are receiving this medicine. Avoid taking products that contain aspirin, acetaminophen, ibuprofen, naproxen, or ketoprofen unless instructed by your doctor. These medicines may hide a fever. Do not become pregnant while taking this medicine or for 7 months after stopping it, men with female partners should use contraception during treatment and for 4 months after the last dose. Women should inform their doctor if they wish to become pregnant or think they might be pregnant. There is a potential for serious side effects to an unborn child. Do not breast-feed an infant while taking this medicine or for 7 months after the last dose. Men who have a partner who is pregnant or who is capable of becoming pregnant should use a condom during sexual activity while taking this medicine and for 4 months after stopping it. Men should inform their doctors if they wish to father a child. This medicine may lower sperm counts. Talk to your health care professional or pharmacist for more information. What side effects may I notice from receiving this medicine? Side effects that you should report to your doctor or health care professional as soon as possible: -allergic reactions like skin rash, itching or hives, swelling of the face, lips, or tongue -breathing problems -chest pain or palpitations -fever or chills, sore throat -general ill feeling or flu-like symptoms -light-colored stools -nausea, vomiting -pain, tingling, numbness in the hands or feet -signs and symptoms of bleeding such as bloody or black, tarry stools; red or dark-brown urine; spitting up blood or brown material that looks like coffee grounds; red spots on the skin; unusual bruising or bleeding from the eye, gums, or nose -swelling of the legs or ankles -yellowing of the eyes or skin Side effects that usually do not require  medical attention (report to your doctor or health care professional if they continue or are bothersome): -changes in taste -constipation -dizziness -headache -joint pain -muscle pain -trouble sleeping -unusually weak or tired This list may not describe all possible side effects. Call your doctor for medical advice about side effects. You may report side effects to FDA at 1-800-FDA-1088. Where should I keep my medicine? This drug is given in a hospital or clinic and will not be stored at home. NOTE: This sheet is a summary. It may not cover all possible information. If you have questions about this medicine, talk to your doctor, pharmacist, or health care provider.  2018 Elsevier/Gold Standard (2015-03-11 12:11:06)  

## 2017-07-14 NOTE — Telephone Encounter (Signed)
No LOS 6/12

## 2017-07-14 NOTE — Telephone Encounter (Signed)
Per [name] okay to treat with BP 189/72

## 2017-07-15 ENCOUNTER — Other Ambulatory Visit: Payer: Self-pay | Admitting: Hematology

## 2017-07-15 ENCOUNTER — Telehealth: Payer: Self-pay

## 2017-07-15 MED ORDER — AMLODIPINE BESYLATE 5 MG PO TABS: 5.0000 mg | ORAL_TABLET | Freq: Every day | ORAL | 0 refills | Status: DC

## 2017-07-15 NOTE — Telephone Encounter (Signed)
Patient had called stating her PCP is out of the office all week, requested [name] call in BP med, per [name] she has sent to her pharmacy a script for Amlodipine 5 mg for patient to start.  LVM with above information.

## 2017-07-16 ENCOUNTER — Ambulatory Visit: Payer: Medicare Other | Admitting: Physical Therapy

## 2017-07-16 DIAGNOSIS — M25612 Stiffness of left shoulder, not elsewhere classified: Secondary | ICD-10-CM | POA: Diagnosis not present

## 2017-07-16 DIAGNOSIS — Z483 Aftercare following surgery for neoplasm: Secondary | ICD-10-CM | POA: Diagnosis not present

## 2017-07-16 DIAGNOSIS — R293 Abnormal posture: Secondary | ICD-10-CM

## 2017-07-16 DIAGNOSIS — R2681 Unsteadiness on feet: Secondary | ICD-10-CM

## 2017-07-16 NOTE — Therapy (Signed)
[place], [place], [address] Phone: [phone]   Fax:  [phone]  Physical Therapy Treatment  Patient Details  Name: Penny Huynh MRN: [id number] Date of Birth: 02/13/1933 Referring Provider: [name]   Encounter Date: 07/16/2017  PT End of Session - 07/16/17 1238    Visit Number  25 kx    Number of Visits  27    Date for PT Re-Evaluation  07/23/17    PT Start Time  0832    PT Stop Time  0923    PT Time Calculation (min)  51 min    Activity Tolerance  Patient tolerated treatment well    Behavior During Therapy  [facility] for tasks assessed/performed       Past Medical History:  Diagnosis Date  . Arthritis    "maybe a little in my left knee" (02/23/2017)  . Asthmatic bronchitis   . Cancer of left breast ([place])   . Coronary atherosclerosis    Minor nonobstructive CAD at cardiac catheterization 2009  . Dyslipidemia   . Femoral bruit 7/08   With normal ABIs  . Hypertension   . Lower extremity edema    Chronic  . Pneumonia ~ 1950   in 8th grade  . Type II diabetes mellitus ([place])     Past Surgical History:  Procedure Laterality Date  . APPENDECTOMY    . BREAST BIOPSY Left 02/2017  . CARDIAC CATHETERIZATION     "long time ago" (02/23/2017)  . MASTECTOMY COMPLETE / SIMPLE W/ SENTINEL NODE BIOPSY Left 02/23/2017   TOTAL MATECTOMY;  LEFT AXILLARY SENTINEL LYMPH NODE BIOPSY ERAS PATHWAY  . MASTECTOMY WITH RADIOACTIVE SEED GUIDED EXCISION AND AXILLARY SENTINEL LYMPH NODE BIOPSY Left 02/23/2017   Procedure: LEFT TOTAL MATECTOMY;  LEFT AXILLARY SENTINEL LYMPH NODE BIOPSY ERAS PATHWAY;  Surgeon: Excell Seltzer, MD;  Location: [place];  Service: General;  Laterality: Left;  . PILONIDAL CYST EXCISION    . PORTA CATH INSERTION  02/23/2017  . PORTACATH PLACEMENT N/A 02/23/2017   Procedure: INSERTION PORT-A-CATH;  Surgeon: Excell Seltzer, MD;  Location: [place];  Service: General;   Laterality: N/A;  . SHOULDER ARTHROSCOPY W/ ROTATOR CUFF REPAIR Right     There were no vitals filed for this visit.  Subjective Assessment - 07/16/17 0835    Subjective  My blood pressure's up. They added a medication. I think the balance exercise is helping. Some of those are difficult. Got about 33 ccs drained off the left flank yesterday.    Pertinent History  Patient underwent a left mastectomy and sentinel node biopsy (6 negative nodes) on 02/23/17. She will begin chemotherapy 03/31/17 as long as drain is removed today. She has 2 different breast cancers. Both are ER/PR positive but one is HER2 negative and one is HER2 positive. History of right shoulder scope 20 years ago and has diabetes and hypertension, both of which are well controlled.    Patient Stated Goals  See if my arm is ok    Currently in Pain?  No/denies                       [facility] Adult PT Treatment/Exercise - 07/16/17 0001      Knee/Hip Exercises: Standing   Hip Flexion  AROM;Right;Left;10 reps;Knee straight fingers on back of bike, keep foot off floor    Hip Abduction  Right;Left;10 reps;AROM fingertips on back of bike; keeping foot off floor    Hip  Extension  Right;Left;10 reps;AROM fingertips on back of bike, keep foot off floor; cueing      Shoulder Exercises: Standing   ABduction  AAROM;Left;5 reps using finger ladder      Shoulder Exercises: Pulleys   Flexion  2 minutes    Flexion Limitations  cueing for technique    ABduction  2 minutes    ABduction Limitations  cueing for technique      Shoulder Exercises: Therapy Ball   Flexion  Both;10 reps Forward lean into end of stretch; reviewed instruction today      Manual Therapy   Soft tissue mobilization  to cording at left axilla; movement of mastectomy incision tissue area in all directions    Myofascial Release  crosshands across left mastectomy area    Passive ROM  in supine to left shoulder into er, abduction, and flexion              PT Education - 07/16/17 1234    Education provided  Yes    Education Details  reviewed 3 way hip SLR in standing keeping foot from touching floor between reps    Person(s) Educated  Patient    Methods  Explanation;Verbal cues    Comprehension  Verbalized understanding;Returned demonstration          PT Long Term Goals - 07/13/17 1319      PT LONG TERM GOAL #1   Title  Patient will be independent in her home exercise program for shoulder ROM.    Status  Achieved      PT LONG TERM GOAL #2   Title  Patient will demonstrate shoulder flexion AROM to be >/= 140 degrees for increased ease reaching overhead.    Baseline  65 degrees; 131 degrees - 04/13/17, 04/19/17- 130; 141 on 05/17/17; 148 degrees-07/13/17    Status  Achieved      PT LONG TERM GOAL #3   Title  Patient will demonstrate shoulder abduction AROM to be >/= 140 degrees for increased ease reaching overhead.    Baseline  52 degrees; 129 degrees - 04/13/17, 04/19/17- 155; 156 degrees-07/13/17    Status  Achieved      PT LONG TERM GOAL #4   Title  Patient will score </= 10 on the DASH indicating increased shoulder function.    Baseline  54.55, 04/19/17- 18.18; 9.09 on 06/04/17 with patient answering 10 of 11 questions    Status  Achieved      PT LONG TERM GOAL #5   Title  Patient will verbalize she understands lymphedema risk reduction.    Baseline  04/19/17- re educated pt in risk reduction handout; gave handout on 05/17/17    Status  Achieved      PT LONG TERM GOAL #6   Title  Left shoulder abduction to at least 160 degrees    Baseline  146 on 06/18/17; 156 degrees-07/13/17    Status  Partially Met      PT LONG TERM GOAL #7   Title  Pt. will be independent with home exercise for balance.    Status  On-going      Breast Clinic Goals - 02/08/17 2024      Patient will be able to verbalize understanding of pertinent lymphedema risk reduction practices relevant to her diagnosis specifically related to skin care.    Status  Achieved      Patient will be able to return demonstrate and/or verbalize understanding of the post-op home exercise program related to regaining shoulder  range of motion.   Status  Achieved      Patient will be able to verbalize understanding of the importance of attending the postoperative After Breast Cancer Class for further lymphedema risk reduction education and therapeutic exercise.   Status  Achieved           Plan - 07/16/17 1239    Clinical Impression Statement  Pt. continues to do well with both balance exercises and shoulder ROM.    Rehab Potential  Excellent    PT Frequency  2x / week    PT Duration  4 weeks    PT Treatment/Interventions  ADLs/Self Care Home Management;Therapeutic exercise;Patient/family education;Orthotic Fit/Training;Manual techniques;Passive range of motion;Manual lymph drainage;Scar mobilization;DME Instruction    PT Next Visit Plan  Continue to include balance exercises; consider another balance assessment (besides Berg, on which she did well), such as DGI. Continue manual work on left chest.     PT Home Exercise Plan  focus on stretches to try to release cording; supine scapular series  Pt. returned to her previous exercise program at her church as of 05/17/17; horizontal abduction stretch in supine; standing leg raises, other balance exercises    Consulted and Agree with Plan of Care  Patient       Patient will benefit from skilled therapeutic intervention in order to improve the following deficits and impairments:  Postural dysfunction, Decreased knowledge of precautions, Decreased knowledge of use of DME, Impaired UE functional use, Decreased range of motion, Decreased strength, Increased fascial restricitons, Decreased scar mobility  Visit Diagnosis: Aftercare following surgery for neoplasm  Stiffness of left shoulder, not elsewhere classified  Abnormal posture  Unsteadiness on feet     Problem List Patient Active Problem List    Diagnosis Date Noted  . Port-A-Cath in place 04/14/2017  . Stage II breast cancer, left ([place]) 02/23/2017  . Carcinoma of upper-outer quadrant of left breast in female, estrogen receptor positive ([place]) 02/10/2017  . Carcinoma of upper-inner quadrant of left breast in female, estrogen receptor positive ([place]) 02/10/2017  . Chest pain 07/18/2015  . Hypokalemia 07/18/2015  . Vertigo 07/18/2015  . LBBB (left bundle branch block) 05/28/2009  . AODM 12/21/2007  . Essential hypertension, benign 12/21/2007  . DIASTOLIC DYSFUNCTION 44/96/7591  . FEMORAL BRUIT, RIGHT 12/21/2007  . DYSPNEA 12/21/2007    SALISBURY,DONNA 07/16/2017, 12:43 PM  [place], [place], [address] Phone: [phone]   Fax:  [phone]  Name: RENIAH COTTINGHAM MRN: [id number] Date of Birth: 1933/09/10  Serafina Royals, PT 07/16/17 12:43 PM

## 2017-07-19 ENCOUNTER — Ambulatory Visit: Payer: Medicare Other | Admitting: Physical Therapy

## 2017-07-19 ENCOUNTER — Ambulatory Visit
Admission: RE | Admit: 2017-07-19 | Discharge: 2017-07-19 | Disposition: A | Discharge status: Home / Self Care | Payer: Medicare Other | Source: Ambulatory Visit | Attending: Radiation Oncology | Admitting: Radiation Oncology

## 2017-07-19 DIAGNOSIS — Z170 Estrogen receptor positive status [ER+]: Secondary | ICD-10-CM | POA: Diagnosis not present

## 2017-07-19 DIAGNOSIS — Z483 Aftercare following surgery for neoplasm: Secondary | ICD-10-CM | POA: Diagnosis not present

## 2017-07-19 DIAGNOSIS — R2681 Unsteadiness on feet: Secondary | ICD-10-CM

## 2017-07-19 DIAGNOSIS — M25612 Stiffness of left shoulder, not elsewhere classified: Secondary | ICD-10-CM | POA: Diagnosis not present

## 2017-07-19 DIAGNOSIS — R293 Abnormal posture: Secondary | ICD-10-CM | POA: Diagnosis not present

## 2017-07-19 DIAGNOSIS — Z510 Encounter for antineoplastic radiation therapy: Secondary | ICD-10-CM | POA: Diagnosis not present

## 2017-07-19 DIAGNOSIS — C50212 Malignant neoplasm of upper-inner quadrant of left female breast: Secondary | ICD-10-CM | POA: Diagnosis not present

## 2017-07-19 NOTE — Therapy (Signed)
[place], [place], [address] Phone: [phone]   Fax:  [phone]  Physical Therapy Treatment  Patient Details  Name: Penny Huynh MRN: [id number] Date of Birth: 11-Feb-1933 Referring Provider: [name]   Encounter Date: 07/19/2017  PT End of Session - 07/19/17 1656    Visit Number  26 kx    Number of Visits  27    Date for PT Re-Evaluation  07/23/17    PT Start Time  3710    PT Stop Time  1602    PT Time Calculation (min)  46 min    Activity Tolerance  Patient tolerated treatment well    Behavior During Therapy  [facility] for tasks assessed/performed       Past Medical History:  Diagnosis Date  . Arthritis    "maybe a little in my left knee" (02/23/2017)  . Asthmatic bronchitis   . Cancer of left breast ([place])   . Coronary atherosclerosis    Minor nonobstructive CAD at cardiac catheterization 2009  . Dyslipidemia   . Femoral bruit 7/08   With normal ABIs  . Hypertension   . Lower extremity edema    Chronic  . Pneumonia ~ 1950   in 8th grade  . Type II diabetes mellitus ([place])     Past Surgical History:  Procedure Laterality Date  . APPENDECTOMY    . BREAST BIOPSY Left 02/2017  . CARDIAC CATHETERIZATION     "long time ago" (02/23/2017)  . MASTECTOMY COMPLETE / SIMPLE W/ SENTINEL NODE BIOPSY Left 02/23/2017   TOTAL MATECTOMY;  LEFT AXILLARY SENTINEL LYMPH NODE BIOPSY ERAS PATHWAY  . MASTECTOMY WITH RADIOACTIVE SEED GUIDED EXCISION AND AXILLARY SENTINEL LYMPH NODE BIOPSY Left 02/23/2017   Procedure: LEFT TOTAL MATECTOMY;  LEFT AXILLARY SENTINEL LYMPH NODE BIOPSY ERAS PATHWAY;  Surgeon: Excell Seltzer, MD;  Location: [place];  Service: General;  Laterality: Left;  . PILONIDAL CYST EXCISION    . PORTA CATH INSERTION  02/23/2017  . PORTACATH PLACEMENT N/A 02/23/2017   Procedure: INSERTION PORT-A-CATH;  Surgeon: Excell Seltzer, MD;  Location: [place];  Service: General;   Laterality: N/A;  . SHOULDER ARTHROSCOPY W/ ROTATOR CUFF REPAIR Right     There were no vitals filed for this visit.  Subjective Assessment - 07/19/17 1518    Subjective  "My blood pressure's a little better (on a new medication). Had radiation simulation today." I'm a little tired today.     Pertinent History  Patient underwent a left mastectomy and sentinel node biopsy (6 negative nodes) on 02/23/17. She will begin chemotherapy 03/31/17 as long as drain is removed today. She has 2 different breast cancers. Both are ER/PR positive but one is HER2 negative and one is HER2 positive. History of right shoulder scope 20 years ago and has diabetes and hypertension, both of which are well controlled.    Patient Stated Goals  See if my arm is ok    Currently in Pain?  No/denies         [facility] PT Assessment - 07/19/17 0001      Balance   Balance Assessed  Yes      Standardized Balance Assessment   Standardized Balance Assessment  Dynamic Gait Index      Dynamic Gait Index   Level Surface  Normal    Change in Gait Speed  Normal    Gait with Horizontal Head Turns  Mild Impairment    Gait with Vertical Head Turns  Mild Impairment    Gait and Pivot Turn  Normal    Step Over Obstacle  Normal    Step Around Obstacles  Normal    Steps  Mild Impairment    Total Score  21                   [facility] Adult PT Treatment/Exercise - 07/19/17 0001      Manual Therapy   Soft tissue mobilization  to cording at left axilla with arm in abduction and flexion; movement of mastectomy incision tissue area in all directions    Myofascial Release  crosshands across left mastectomy area in vertical and diagonal; across left axilla    Passive ROM  in supine to left shoulder into er, abduction, and flexion                  PT Long Term Goals - 07/13/17 1319      PT LONG TERM GOAL #1   Title  Patient will be independent in her home exercise program for shoulder ROM.    Status  Achieved       PT LONG TERM GOAL #2   Title  Patient will demonstrate shoulder flexion AROM to be >/= 140 degrees for increased ease reaching overhead.    Baseline  65 degrees; 131 degrees - 04/13/17, 04/19/17- 130; 141 on 05/17/17; 148 degrees-07/13/17    Status  Achieved      PT LONG TERM GOAL #3   Title  Patient will demonstrate shoulder abduction AROM to be >/= 140 degrees for increased ease reaching overhead.    Baseline  52 degrees; 129 degrees - 04/13/17, 04/19/17- 155; 156 degrees-07/13/17    Status  Achieved      PT LONG TERM GOAL #4   Title  Patient will score </= 10 on the DASH indicating increased shoulder function.    Baseline  54.55, 04/19/17- 18.18; 9.09 on 06/04/17 with patient answering 10 of 11 questions    Status  Achieved      PT LONG TERM GOAL #5   Title  Patient will verbalize she understands lymphedema risk reduction.    Baseline  04/19/17- re educated pt in risk reduction handout; gave handout on 05/17/17    Status  Achieved      PT LONG TERM GOAL #6   Title  Left shoulder abduction to at least 160 degrees    Baseline  146 on 06/18/17; 156 degrees-07/13/17    Status  Partially Met      PT LONG TERM GOAL #7   Title  Pt. will be independent with home exercise for balance.    Status  On-going      Breast Clinic Goals - 02/08/17 2024      Patient will be able to verbalize understanding of pertinent lymphedema risk reduction practices relevant to her diagnosis specifically related to skin care.   Status  Achieved      Patient will be able to return demonstrate and/or verbalize understanding of the post-op home exercise program related to regaining shoulder range of motion.   Status  Achieved      Patient will be able to verbalize understanding of the importance of attending the postoperative After Breast Cancer Class for further lymphedema risk reduction education and therapeutic exercise.   Status  Achieved           Plan - 07/19/17 1656    Clinical Impression Statement   Pt. was accompanied by her niece, who  is visiting this week. She did well on the Dynamic Gait Index today, scoring 21 of 24 points. She feels like she is just up and down some with her balance, some days better than others, and for no apparent reason. Left chest mastectomy incision area felt very tight today, so some focus on loosening that.  Just a small amount of cording palpated and seen posterior to the larger tight tendons in her axilla.    Rehab Potential  Excellent    PT Frequency  2x / week    PT Duration  4 weeks    PT Treatment/Interventions  ADLs/Self Care Home Management;Therapeutic exercise;Patient/family education;Orthotic Fit/Training;Manual techniques;Passive range of motion;Manual lymph drainage;Scar mobilization;DME Instruction    PT Next Visit Plan  Will need renewal or DC next time: discuss with patient. Continue to include balance exercises such as walking with head turns. Continue manual work on left chest.     PT Home Exercise Plan  focus on stretches to try to release cording; supine scapular series  Pt. returned to her previous exercise program at her church as of 05/17/17; horizontal abduction stretch in supine; standing leg raises, other balance exercises    Consulted and Agree with Plan of Care  Patient       Patient will benefit from skilled therapeutic intervention in order to improve the following deficits and impairments:  Postural dysfunction, Decreased knowledge of precautions, Decreased knowledge of use of DME, Impaired UE functional use, Decreased range of motion, Decreased strength, Increased fascial restricitons, Decreased scar mobility  Visit Diagnosis: Aftercare following surgery for neoplasm  Stiffness of left shoulder, not elsewhere classified  Abnormal posture  Unsteadiness on feet     Problem List Patient Active Problem List   Diagnosis Date Noted  . Port-A-Cath in place 04/14/2017  . Stage II breast cancer, left ([place]) 02/23/2017  . Carcinoma  of upper-outer quadrant of left breast in female, estrogen receptor positive ([place]) 02/10/2017  . Carcinoma of upper-inner quadrant of left breast in female, estrogen receptor positive ([place]) 02/10/2017  . Chest pain 07/18/2015  . Hypokalemia 07/18/2015  . Vertigo 07/18/2015  . LBBB (left bundle branch block) 05/28/2009  . AODM 12/21/2007  . Essential hypertension, benign 12/21/2007  . DIASTOLIC DYSFUNCTION 98/72/1587  . FEMORAL BRUIT, RIGHT 12/21/2007  . DYSPNEA 12/21/2007    Penny Huynh 07/19/2017, 5:01 PM  [place], [place], [address] Phone: [phone]   Fax:  [phone]  Name: Penny Huynh MRN: [id number] Date of Birth: Nov 21, 1933  Serafina Royals, PT 07/19/17 5:01 PM

## 2017-07-20 ENCOUNTER — Ambulatory Visit
Admission: RE | Admit: 2017-07-20 | Discharge: 2017-07-20 | Disposition: A | Discharge status: Home / Self Care | Payer: Medicare Other | Source: Ambulatory Visit | Attending: Radiation Oncology | Admitting: Radiation Oncology

## 2017-07-20 DIAGNOSIS — Z170 Estrogen receptor positive status [ER+]: Secondary | ICD-10-CM | POA: Diagnosis not present

## 2017-07-20 DIAGNOSIS — C50212 Malignant neoplasm of upper-inner quadrant of left female breast: Secondary | ICD-10-CM | POA: Diagnosis not present

## 2017-07-20 DIAGNOSIS — C50412 Malignant neoplasm of upper-outer quadrant of left female breast: Secondary | ICD-10-CM

## 2017-07-20 DIAGNOSIS — Z510 Encounter for antineoplastic radiation therapy: Secondary | ICD-10-CM | POA: Diagnosis not present

## 2017-07-20 MED ORDER — RADIAPLEXRX EX GEL
Freq: Once | CUTANEOUS | Status: AC
  Administered 2017-07-20: 14:00:00 via TOPICAL

## 2017-07-20 NOTE — Progress Notes (Signed)
Pt here for patient teaching.  Pt given Radiation and You booklet and Radiaplex gel.  Reviewed areas of pertinence such as fatigue, hair loss, skin changes, breast tenderness, breast swelling and taste changes . Pt able to give teach back of to pat skin and use unscented/gentle soap,apply Radiaplex bid and avoid applying anything to skin within 4 hours of treatment. Pt demonstrated understanding and verbalizes understanding of information given and will contact nursing with any questions or concerns.     Loma Sousa, RN BSN

## 2017-07-21 ENCOUNTER — Ambulatory Visit
Admission: RE | Admit: 2017-07-21 | Discharge: 2017-07-21 | Disposition: A | Discharge status: Home / Self Care | Payer: Medicare Other | Source: Ambulatory Visit | Attending: Radiation Oncology | Admitting: Radiation Oncology

## 2017-07-21 DIAGNOSIS — C50212 Malignant neoplasm of upper-inner quadrant of left female breast: Secondary | ICD-10-CM | POA: Diagnosis not present

## 2017-07-21 DIAGNOSIS — Z510 Encounter for antineoplastic radiation therapy: Secondary | ICD-10-CM | POA: Diagnosis not present

## 2017-07-21 DIAGNOSIS — Z170 Estrogen receptor positive status [ER+]: Secondary | ICD-10-CM | POA: Diagnosis not present

## 2017-07-22 ENCOUNTER — Ambulatory Visit
Admission: RE | Admit: 2017-07-22 | Discharge: 2017-07-22 | Disposition: A | Discharge status: Home / Self Care | Payer: Medicare Other | Source: Ambulatory Visit | Attending: Radiation Oncology | Admitting: Radiation Oncology

## 2017-07-22 DIAGNOSIS — C50212 Malignant neoplasm of upper-inner quadrant of left female breast: Secondary | ICD-10-CM | POA: Diagnosis not present

## 2017-07-22 DIAGNOSIS — Z170 Estrogen receptor positive status [ER+]: Secondary | ICD-10-CM | POA: Diagnosis not present

## 2017-07-22 DIAGNOSIS — Z510 Encounter for antineoplastic radiation therapy: Secondary | ICD-10-CM | POA: Diagnosis not present

## 2017-07-23 ENCOUNTER — Ambulatory Visit: Payer: Medicare Other | Admitting: Physical Therapy

## 2017-07-23 ENCOUNTER — Ambulatory Visit
Admission: RE | Admit: 2017-07-23 | Discharge: 2017-07-23 | Disposition: A | Discharge status: Home / Self Care | Payer: Medicare Other | Source: Ambulatory Visit | Attending: Radiation Oncology | Admitting: Radiation Oncology

## 2017-07-23 DIAGNOSIS — M25612 Stiffness of left shoulder, not elsewhere classified: Secondary | ICD-10-CM | POA: Diagnosis not present

## 2017-07-23 DIAGNOSIS — Z483 Aftercare following surgery for neoplasm: Secondary | ICD-10-CM | POA: Diagnosis not present

## 2017-07-23 DIAGNOSIS — C50212 Malignant neoplasm of upper-inner quadrant of left female breast: Secondary | ICD-10-CM | POA: Diagnosis not present

## 2017-07-23 DIAGNOSIS — R293 Abnormal posture: Secondary | ICD-10-CM | POA: Diagnosis not present

## 2017-07-23 DIAGNOSIS — R2681 Unsteadiness on feet: Secondary | ICD-10-CM | POA: Diagnosis not present

## 2017-07-23 DIAGNOSIS — Z170 Estrogen receptor positive status [ER+]: Secondary | ICD-10-CM | POA: Diagnosis not present

## 2017-07-23 DIAGNOSIS — Z510 Encounter for antineoplastic radiation therapy: Secondary | ICD-10-CM | POA: Diagnosis not present

## 2017-07-23 NOTE — Therapy (Signed)
[place], [place], [address] Phone: [phone]   Fax:  [phone]  Physical Therapy Treatment  Patient Details  Name: Penny Huynh MRN: [id number] Date of Birth: [birth year] Referring Provider: [name]   Encounter Date: 07/23/2017  PT End of Session - 07/23/17 1229    Visit Number  27 kx    Number of Visits  31    Date for PT Re-Evaluation  08/06/17    PT Start Time  1024    PT Stop Time  1110    PT Time Calculation (min)  46 min    Activity Tolerance  Patient tolerated treatment well    Behavior During Therapy  [facility] for tasks assessed/performed       Past Medical History:  Diagnosis Date  . Arthritis    "maybe a little in my left knee" (02/23/2017)  . Asthmatic bronchitis   . Cancer of left breast ([place])   . Coronary atherosclerosis    Minor nonobstructive CAD at cardiac catheterization 2009  . Dyslipidemia   . Femoral bruit 7/08   With normal ABIs  . Hypertension   . Lower extremity edema    Chronic  . Pneumonia ~ 1950   in 8th grade  . Type II diabetes mellitus ([place])     Past Surgical History:  Procedure Laterality Date  . APPENDECTOMY    . BREAST BIOPSY Left 02/2017  . CARDIAC CATHETERIZATION     "long time ago" (02/23/2017)  . MASTECTOMY COMPLETE / SIMPLE W/ SENTINEL NODE BIOPSY Left 02/23/2017   TOTAL MATECTOMY;  LEFT AXILLARY SENTINEL LYMPH NODE BIOPSY ERAS PATHWAY  . MASTECTOMY WITH RADIOACTIVE SEED GUIDED EXCISION AND AXILLARY SENTINEL LYMPH NODE BIOPSY Left 02/23/2017   Procedure: LEFT TOTAL MATECTOMY;  LEFT AXILLARY SENTINEL LYMPH NODE BIOPSY ERAS PATHWAY;  Surgeon: Excell Seltzer, MD;  Location: [place];  Service: General;  Laterality: Left;  . PILONIDAL CYST EXCISION    . PORTA CATH INSERTION  02/23/2017  . PORTACATH PLACEMENT N/A 02/23/2017   Procedure: INSERTION PORT-A-CATH;  Surgeon: Excell Seltzer, MD;  Location: [place];  Service: General;   Laterality: N/A;  . SHOULDER ARTHROSCOPY W/ ROTATOR CUFF REPAIR Right     There were no vitals filed for this visit.  Subjective Assessment - 07/23/17 1027    Subjective  Blood pressure continues to do better. Balance has been better the last couple days--I don't think I've thought about using my cane.    Pertinent History  Patient underwent a left mastectomy and sentinel node biopsy (6 negative nodes) on 02/23/17. She will begin chemotherapy 03/31/17 as long as drain is removed today. She has 2 different breast cancers. Both are ER/PR positive but one is HER2 negative and one is HER2 positive. History of right shoulder scope 20 years ago and has diabetes and hypertension, both of which are well controlled.    Patient Stated Goals  See if my arm is ok    Currently in Pain?  No/denies         [facility] PT Assessment - 07/23/17 0001      AROM   Left Shoulder ABduction  130 Degrees prior to stretching                   [facility] Adult PT Treatment/Exercise - 07/23/17 0001      Manual Therapy   Soft tissue mobilization  to cording at left axilla with arm in abduction; movement of mastectomy incision tissue  area in all directions    Myofascial Release  crosshands across left mastectomy area in vertical and diagonal; across left axilla; left UE myofascial pulling with movement into abduction    Passive ROM  in supine to left shoulder into er, abduction, and flexion                  PT Long Term Goals - 07/23/17 1028      PT LONG TERM GOAL #1   Title  Patient will be independent in her home exercise program for shoulder ROM.    Status  Achieved      PT LONG TERM GOAL #2   Title  Patient will demonstrate shoulder flexion AROM to be >/= 140 degrees for increased ease reaching overhead.    Status  Achieved      PT LONG TERM GOAL #3   Title  Patient will demonstrate shoulder abduction AROM to be >/= 140 degrees for increased ease reaching overhead.    Status  Achieved       PT LONG TERM GOAL #4   Title  Patient will score </= 10 on the DASH indicating increased shoulder function.    Status  Achieved      PT LONG TERM GOAL #5   Title  Patient will verbalize she understands lymphedema risk reduction.    Status  Achieved      PT LONG TERM GOAL #6   Title  Left shoulder abduction to at least 160 degrees    Status  Partially Met      PT LONG TERM GOAL #7   Title  Pt. will be independent with home exercise for balance.    Status  Achieved      Breast Clinic Goals - 02/08/17 2024      Patient will be able to verbalize understanding of pertinent lymphedema risk reduction practices relevant to her diagnosis specifically related to skin care.   Status  Achieved      Patient will be able to return demonstrate and/or verbalize understanding of the post-op home exercise program related to regaining shoulder range of motion.   Status  Achieved      Patient will be able to verbalize understanding of the importance of attending the postoperative After Breast Cancer Class for further lymphedema risk reduction education and therapeutic exercise.   Status  Achieved           Plan - 07/23/17 1231    Clinical Impression Statement  Pt. has met all but one goal. She is still limited with left chest soft tissue tightness, has a significantly tight pect down to her incision, and has some cording at axilla and also noted cording at elbow again today at end range of abduction.    Rehab Potential  Excellent    PT Frequency  2x / week    PT Duration  2 weeks    PT Treatment/Interventions  ADLs/Self Care Home Management;Therapeutic exercise;Patient/family education;Orthotic Fit/Training;Manual techniques;Passive range of motion;Manual lymph drainage;Scar mobilization;DME Instruction    PT Next Visit Plan  Renewal done today for four visits/two weeks. Continue focus on manual stretching at left shoulder/chest/axilla and also on balance exercise.    PT Home Exercise Plan   focus on stretches to try to release cording; supine scapular series  Pt. returned to her previous exercise program at her church as of 05/17/17; horizontal abduction stretch in supine; standing leg raises, other balance exercises    Consulted and Agree with Plan of Care  Patient       Patient will benefit from skilled therapeutic intervention in order to improve the following deficits and impairments:  Postural dysfunction, Decreased knowledge of precautions, Decreased knowledge of use of DME, Impaired UE functional use, Decreased range of motion, Decreased strength, Increased fascial restricitons, Decreased scar mobility  Visit Diagnosis: Aftercare following surgery for neoplasm - Plan: PT plan of care cert/re-cert  Stiffness of left shoulder, not elsewhere classified - Plan: PT plan of care cert/re-cert  Abnormal posture - Plan: PT plan of care cert/re-cert  Unsteadiness on feet - Plan: PT plan of care cert/re-cert     Problem List Patient Active Problem List   Diagnosis Date Noted  . Port-A-Cath in place 04/14/2017  . Stage II breast cancer, left ([place]) 02/23/2017  . Carcinoma of upper-outer quadrant of left breast in female, estrogen receptor positive ([place]) 02/10/2017  . Carcinoma of upper-inner quadrant of left breast in female, estrogen receptor positive ([place]) 02/10/2017  . Chest pain 07/18/2015  . Hypokalemia 07/18/2015  . Vertigo 07/18/2015  . LBBB (left bundle branch block) 05/28/2009  . AODM 12/21/2007  . Essential hypertension, benign 12/21/2007  . DIASTOLIC DYSFUNCTION 84/16/6063  . FEMORAL BRUIT, RIGHT 12/21/2007  . DYSPNEA 12/21/2007    Hernan Turnage 07/23/2017, 12:36 PM  [place], [place], [address] Phone: [phone]   Fax:  [phone]  Name: LEAHANN LEMPKE MRN: [id number] Date of Birth: 08-09-1933  Serafina Royals, PT 07/23/17 12:36 PM

## 2017-07-26 ENCOUNTER — Ambulatory Visit
Admission: RE | Admit: 2017-07-26 | Discharge: 2017-07-26 | Disposition: A | Discharge status: Home / Self Care | Payer: Medicare Other | Source: Ambulatory Visit | Attending: Radiation Oncology | Admitting: Radiation Oncology

## 2017-07-26 DIAGNOSIS — C50212 Malignant neoplasm of upper-inner quadrant of left female breast: Secondary | ICD-10-CM | POA: Diagnosis not present

## 2017-07-26 DIAGNOSIS — Z170 Estrogen receptor positive status [ER+]: Secondary | ICD-10-CM | POA: Diagnosis not present

## 2017-07-26 DIAGNOSIS — Z510 Encounter for antineoplastic radiation therapy: Secondary | ICD-10-CM | POA: Diagnosis not present

## 2017-07-27 ENCOUNTER — Ambulatory Visit
Admission: RE | Admit: 2017-07-27 | Discharge: 2017-07-27 | Disposition: A | Discharge status: Home / Self Care | Payer: Medicare Other | Source: Ambulatory Visit | Attending: Radiation Oncology | Admitting: Radiation Oncology

## 2017-07-27 ENCOUNTER — Ambulatory Visit: Payer: Medicare Other

## 2017-07-27 DIAGNOSIS — R293 Abnormal posture: Secondary | ICD-10-CM | POA: Diagnosis not present

## 2017-07-27 DIAGNOSIS — Z483 Aftercare following surgery for neoplasm: Secondary | ICD-10-CM

## 2017-07-27 DIAGNOSIS — Z510 Encounter for antineoplastic radiation therapy: Secondary | ICD-10-CM | POA: Diagnosis not present

## 2017-07-27 DIAGNOSIS — M25612 Stiffness of left shoulder, not elsewhere classified: Secondary | ICD-10-CM

## 2017-07-27 DIAGNOSIS — Z170 Estrogen receptor positive status [ER+]: Secondary | ICD-10-CM | POA: Diagnosis not present

## 2017-07-27 DIAGNOSIS — R2681 Unsteadiness on feet: Secondary | ICD-10-CM | POA: Diagnosis not present

## 2017-07-27 DIAGNOSIS — C50212 Malignant neoplasm of upper-inner quadrant of left female breast: Secondary | ICD-10-CM | POA: Diagnosis not present

## 2017-07-27 NOTE — Therapy (Signed)
[place], [place], [address] Phone: [phone]   Fax:  [phone]  Physical Therapy Treatment  Patient Details  Name: Penny Huynh MRN: [id number] Date of Birth: Sep 18, 1933 Referring Provider: [name]   Encounter Date: 07/27/2017  PT End of Session - 07/27/17 1051    Visit Number  70 KX    Number of Visits  31    Date for PT Re-Evaluation  08/06/17    PT Start Time  1001    PT Stop Time  1049    PT Time Calculation (min)  48 min    Activity Tolerance  Patient tolerated treatment well    Behavior During Therapy  [facility] for tasks assessed/performed       Past Medical History:  Diagnosis Date  . Arthritis    "maybe a little in my left knee" (02/23/2017)  . Asthmatic bronchitis   . Cancer of left breast ([place])   . Coronary atherosclerosis    Minor nonobstructive CAD at cardiac catheterization 2009  . Dyslipidemia   . Femoral bruit 7/08   With normal ABIs  . Hypertension   . Lower extremity edema    Chronic  . Pneumonia ~ 1950   in 8th grade  . Type II diabetes mellitus ([place])     Past Surgical History:  Procedure Laterality Date  . APPENDECTOMY    . BREAST BIOPSY Left 02/2017  . CARDIAC CATHETERIZATION     "long time ago" (02/23/2017)  . MASTECTOMY COMPLETE / SIMPLE W/ SENTINEL NODE BIOPSY Left 02/23/2017   TOTAL MATECTOMY;  LEFT AXILLARY SENTINEL LYMPH NODE BIOPSY ERAS PATHWAY  . MASTECTOMY WITH RADIOACTIVE SEED GUIDED EXCISION AND AXILLARY SENTINEL LYMPH NODE BIOPSY Left 02/23/2017   Procedure: LEFT TOTAL MATECTOMY;  LEFT AXILLARY SENTINEL LYMPH NODE BIOPSY ERAS PATHWAY;  Surgeon: Excell Seltzer, MD;  Location: [place];  Service: General;  Laterality: Left;  . PILONIDAL CYST EXCISION    . PORTA CATH INSERTION  02/23/2017  . PORTACATH PLACEMENT N/A 02/23/2017   Procedure: INSERTION PORT-A-CATH;  Surgeon: Excell Seltzer, MD;  Location: [place];  Service: General;   Laterality: N/A;  . SHOULDER ARTHROSCOPY W/ ROTATOR CUFF REPAIR Right     There were no vitals filed for this visit.  Subjective Assessment - 07/27/17 1005    Subjective  My BP is doing much better since starting Amlodopine. My balance continues to improve. My niece is visiting from [place] and she can tell an improvement as well. Also even though it's definitely still tight, my Lt shoulder is trying to loosen up the more I use it.     Pertinent History  Patient underwent a left mastectomy and sentinel node biopsy (6 negative nodes) on 02/23/17. She will begin chemotherapy 03/31/17 as long as drain is removed today. She has 2 different breast cancers. Both are ER/PR positive but one is HER2 negative and one is HER2 positive. History of right shoulder scope 20 years ago and has diabetes and hypertension, both of which are well controlled.    Patient Stated Goals  See if my arm is ok    Currently in Pain?  No/denies                       [facility] Adult PT Treatment/Exercise - 07/27/17 0001      Manual Therapy   Soft tissue mobilization  to cording at left axilla with arm in abduction; movement of mastectomy incision tissue  area in all directions    Myofascial Release  crosshands across left mastectomy area in vertical and diagonal; across left axilla; left UE myofascial pulling with movement into abduction    Passive ROM  in supine to left shoulder into er, abduction, and flexion                  PT Long Term Goals - 07/23/17 1028      PT LONG TERM GOAL #1   Title  Patient will be independent in her home exercise program for shoulder ROM.    Status  Achieved      PT LONG TERM GOAL #2   Title  Patient will demonstrate shoulder flexion AROM to be >/= 140 degrees for increased ease reaching overhead.    Status  Achieved      PT LONG TERM GOAL #3   Title  Patient will demonstrate shoulder abduction AROM to be >/= 140 degrees for increased ease reaching overhead.     Status  Achieved      PT LONG TERM GOAL #4   Title  Patient will score </= 10 on the DASH indicating increased shoulder function.    Status  Achieved      PT LONG TERM GOAL #5   Title  Patient will verbalize she understands lymphedema risk reduction.    Status  Achieved      PT LONG TERM GOAL #6   Title  Left shoulder abduction to at least 160 degrees    Status  Partially Met      PT LONG TERM GOAL #7   Title  Pt. will be independent with home exercise for balance.    Status  Achieved      Breast Clinic Goals - 02/08/17 2024      Patient will be able to verbalize understanding of pertinent lymphedema risk reduction practices relevant to her diagnosis specifically related to skin care.   Status  Achieved      Patient will be able to return demonstrate and/or verbalize understanding of the post-op home exercise program related to regaining shoulder range of motion.   Status  Achieved      Patient will be able to verbalize understanding of the importance of attending the postoperative After Breast Cancer Class for further lymphedema risk reduction education and therapeutic exercise.   Status  Achieved           Plan - 07/27/17 1052    Clinical Impression Statement  Focused on manual treatment of pts tightness at her Lt pectoralis from incision into axilla. Also at cording at anterior elbow but this was much improved from when this therapist saw her last week. Pt reports being able to get into radiation position without any difficulty now. She has 3 more visits to cont focus on end ROM and decreasing tightness at [employer].     Rehab Potential  Excellent    PT Frequency  2x / week    PT Duration  2 weeks    PT Treatment/Interventions  ADLs/Self Care Home Management;Therapeutic exercise;Patient/family education;Orthotic Fit/Training;Manual techniques;Passive range of motion;Manual lymph drainage;Scar mobilization;DME Instruction    PT Next Visit Plan  Continue focus on manual  stretching at left shoulder/chest/axilla and also on balance exercise.    Consulted and Agree with Plan of Care  Patient       Patient will benefit from skilled therapeutic intervention in order to improve the following deficits and impairments:  Postural dysfunction, Decreased knowledge of precautions,  Decreased knowledge of use of DME, Impaired UE functional use, Decreased range of motion, Decreased strength, Increased fascial restricitons, Decreased scar mobility  Visit Diagnosis: Aftercare following surgery for neoplasm  Stiffness of left shoulder, not elsewhere classified  Abnormal posture     Problem List Patient Active Problem List   Diagnosis Date Noted  . Port-A-Cath in place 04/14/2017  . Stage II breast cancer, left ([place]) 02/23/2017  . Carcinoma of upper-outer quadrant of left breast in female, estrogen receptor positive ([place]) 02/10/2017  . Carcinoma of upper-inner quadrant of left breast in female, estrogen receptor positive ([place]) 02/10/2017  . Chest pain 07/18/2015  . Hypokalemia 07/18/2015  . Vertigo 07/18/2015  . LBBB (left bundle branch block) 05/28/2009  . AODM 12/21/2007  . Essential hypertension, benign 12/21/2007  . DIASTOLIC DYSFUNCTION 76/81/1572  . FEMORAL BRUIT, RIGHT 12/21/2007  . DYSPNEA 12/21/2007    Otelia Limes, PTA 07/27/2017, 10:57 AM  [place], [place], [address] Phone: [phone]   Fax:  [phone]  Name: Penny Huynh MRN: [id number] Date of Birth: 1933/04/13

## 2017-07-28 ENCOUNTER — Ambulatory Visit
Admission: RE | Admit: 2017-07-28 | Discharge: 2017-07-28 | Disposition: A | Discharge status: Home / Self Care | Payer: Medicare Other | Source: Ambulatory Visit | Attending: Radiation Oncology | Admitting: Radiation Oncology

## 2017-07-28 DIAGNOSIS — Z170 Estrogen receptor positive status [ER+]: Secondary | ICD-10-CM | POA: Diagnosis not present

## 2017-07-28 DIAGNOSIS — Z510 Encounter for antineoplastic radiation therapy: Secondary | ICD-10-CM | POA: Diagnosis not present

## 2017-07-28 DIAGNOSIS — C50212 Malignant neoplasm of upper-inner quadrant of left female breast: Secondary | ICD-10-CM | POA: Diagnosis not present

## 2017-07-29 ENCOUNTER — Ambulatory Visit
Admission: RE | Admit: 2017-07-29 | Discharge: 2017-07-29 | Disposition: A | Discharge status: Home / Self Care | Payer: Medicare Other | Source: Ambulatory Visit | Attending: Radiation Oncology | Admitting: Radiation Oncology

## 2017-07-29 DIAGNOSIS — Z170 Estrogen receptor positive status [ER+]: Secondary | ICD-10-CM | POA: Diagnosis not present

## 2017-07-29 DIAGNOSIS — Z510 Encounter for antineoplastic radiation therapy: Secondary | ICD-10-CM | POA: Diagnosis not present

## 2017-07-29 DIAGNOSIS — C50212 Malignant neoplasm of upper-inner quadrant of left female breast: Secondary | ICD-10-CM | POA: Diagnosis not present

## 2017-07-30 ENCOUNTER — Ambulatory Visit
Admission: RE | Admit: 2017-07-30 | Discharge: 2017-07-30 | Disposition: A | Discharge status: Home / Self Care | Payer: Medicare Other | Source: Ambulatory Visit | Attending: Radiation Oncology | Admitting: Radiation Oncology

## 2017-07-30 ENCOUNTER — Telehealth: Payer: Self-pay

## 2017-07-30 ENCOUNTER — Ambulatory Visit: Payer: Medicare Other | Admitting: Physical Therapy

## 2017-07-30 DIAGNOSIS — Z170 Estrogen receptor positive status [ER+]: Secondary | ICD-10-CM | POA: Diagnosis not present

## 2017-07-30 DIAGNOSIS — C50212 Malignant neoplasm of upper-inner quadrant of left female breast: Secondary | ICD-10-CM | POA: Diagnosis not present

## 2017-07-30 DIAGNOSIS — Z510 Encounter for antineoplastic radiation therapy: Secondary | ICD-10-CM | POA: Diagnosis not present

## 2017-07-30 DIAGNOSIS — M25612 Stiffness of left shoulder, not elsewhere classified: Secondary | ICD-10-CM

## 2017-07-30 DIAGNOSIS — Z483 Aftercare following surgery for neoplasm: Secondary | ICD-10-CM | POA: Diagnosis not present

## 2017-07-30 DIAGNOSIS — R293 Abnormal posture: Secondary | ICD-10-CM

## 2017-07-30 DIAGNOSIS — R2681 Unsteadiness on feet: Secondary | ICD-10-CM | POA: Diagnosis not present

## 2017-07-30 NOTE — Telephone Encounter (Signed)
Called in new prescription to [facility] in [place] for Neurontin 100 mg take one tab at bedtime x 2 weeks then increase to 2 tabs at bedtime.  Called patient to let her know about new medication for neuropathy.

## 2017-07-30 NOTE — Therapy (Signed)
[place], [place], [address] Phone: [phone]   Fax:  [phone]  Physical Therapy Treatment  Patient Details  Name: Penny Huynh MRN: [id number] Date of Birth: 10-08-1933 Referring Provider: [name]   Encounter Date: 07/30/2017  PT End of Session - 07/30/17 1236    Visit Number  2 KX    Number of Visits  31    Date for PT Re-Evaluation  08/06/17    PT Start Time  4196    PT Stop Time  1102    PT Time Calculation (min)  47 min    Activity Tolerance  Patient tolerated treatment well    Behavior During Therapy  [facility] for tasks assessed/performed       Past Medical History:  Diagnosis Date  . Arthritis    "maybe a little in my left knee" (02/23/2017)  . Asthmatic bronchitis   . Cancer of left breast ([place])   . Coronary atherosclerosis    Minor nonobstructive CAD at cardiac catheterization 2009  . Dyslipidemia   . Femoral bruit 7/08   With normal ABIs  . Hypertension   . Lower extremity edema    Chronic  . Pneumonia ~ 1950   in 8th grade  . Type II diabetes mellitus ([place])     Past Surgical History:  Procedure Laterality Date  . APPENDECTOMY    . BREAST BIOPSY Left 02/2017  . CARDIAC CATHETERIZATION     "long time ago" (02/23/2017)  . MASTECTOMY COMPLETE / SIMPLE W/ SENTINEL NODE BIOPSY Left 02/23/2017   TOTAL MATECTOMY;  LEFT AXILLARY SENTINEL LYMPH NODE BIOPSY ERAS PATHWAY  . MASTECTOMY WITH RADIOACTIVE SEED GUIDED EXCISION AND AXILLARY SENTINEL LYMPH NODE BIOPSY Left 02/23/2017   Procedure: LEFT TOTAL MATECTOMY;  LEFT AXILLARY SENTINEL LYMPH NODE BIOPSY ERAS PATHWAY;  Surgeon: Excell Seltzer, MD;  Location: [place];  Service: General;  Laterality: Left;  . PILONIDAL CYST EXCISION    . PORTA CATH INSERTION  02/23/2017  . PORTACATH PLACEMENT N/A 02/23/2017   Procedure: INSERTION PORT-A-CATH;  Surgeon: Excell Seltzer, MD;  Location: [place];  Service: General;   Laterality: N/A;  . SHOULDER ARTHROSCOPY W/ ROTATOR CUFF REPAIR Right     There were no vitals filed for this visit.  Subjective Assessment - 07/30/17 1018    Subjective  My neuropathy is terrible. The fatigue has hit from the radiation too. I'm on the 9th treatment--I didn't expect it to get this way so soon. Using cane today because her balance is off. Doesn't feel she can do balance exercises today due to foot pain.    Pertinent History  Patient underwent a left mastectomy and sentinel node biopsy (6 negative nodes) on 02/23/17. She will begin chemotherapy 03/31/17 as long as drain is removed today. She has 2 different breast cancers. Both are ER/PR positive but one is HER2 negative and one is HER2 positive. History of right shoulder scope 20 years ago and has diabetes and hypertension, both of which are well controlled.    Patient Stated Goals  See if my arm is ok    Currently in Pain?  Yes    Pain Score  8     Pain Location  Foot    Pain Orientation  Right;Left    Pain Descriptors / Indicators  Burning    Aggravating Factors   unsure    Pain Relieving Factors  Tramadol may help         [facility] PT  Assessment - 07/30/17 0001      AROM   Left Shoulder ABduction  152 Degrees after stretching                   [facility] Adult PT Treatment/Exercise - 07/30/17 0001      Manual Therapy   Soft tissue mobilization  to incision area at left chest in all directions    Myofascial Release  across left axilla area in various directions and with various hand holds    Passive ROM  in supine to left shoulder into er, abduction, and flexion                  PT Long Term Goals - 07/23/17 1028      PT LONG TERM GOAL #1   Title  Patient will be independent in her home exercise program for shoulder ROM.    Status  Achieved      PT LONG TERM GOAL #2   Title  Patient will demonstrate shoulder flexion AROM to be >/= 140 degrees for increased ease reaching overhead.    Status   Achieved      PT LONG TERM GOAL #3   Title  Patient will demonstrate shoulder abduction AROM to be >/= 140 degrees for increased ease reaching overhead.    Status  Achieved      PT LONG TERM GOAL #4   Title  Patient will score </= 10 on the DASH indicating increased shoulder function.    Status  Achieved      PT LONG TERM GOAL #5   Title  Patient will verbalize she understands lymphedema risk reduction.    Status  Achieved      PT LONG TERM GOAL #6   Title  Left shoulder abduction to at least 160 degrees    Status  Partially Met      PT LONG TERM GOAL #7   Title  Pt. will be independent with home exercise for balance.    Status  Achieved      Breast Clinic Goals - 02/08/17 2024      Patient will be able to verbalize understanding of pertinent lymphedema risk reduction practices relevant to her diagnosis specifically related to skin care.   Status  Achieved      Patient will be able to return demonstrate and/or verbalize understanding of the post-op home exercise program related to regaining shoulder range of motion.   Status  Achieved      Patient will be able to verbalize understanding of the importance of attending the postoperative After Breast Cancer Class for further lymphedema risk reduction education and therapeutic exercise.   Status  Achieved           Plan - 07/30/17 1237    Clinical Impression Statement  Patient had been feeling like her balance was doing better, but today felt like it was worse again. She complained of worsening of her neuropathy and fatigue from radiation, at treatment 9 orf 33. She didn't feel she could work on balance today because of her foot pain. Focused on manual work to chest/shoulder areas and she tolerated stretching well. Still hasn't met last goal for left shoulder ROM.    Rehab Potential  Excellent    PT Frequency  2x / week    PT Duration  2 weeks    PT Treatment/Interventions  ADLs/Self Care Home Management;Therapeutic  exercise;Patient/family education;Orthotic Fit/Training;Manual techniques;Passive range of motion;Manual lymph drainage;Scar mobilization;DME Instruction  PT Next Visit Plan  Continue focus on manual stretching at left shoulder/chest/axilla and also on balance exercise.    PT Home Exercise Plan  focus on stretches to try to release cording; supine scapular series  Pt. returned to her previous exercise program at her church as of 05/17/17; horizontal abduction stretch in supine; standing leg raises, other balance exercises    Consulted and Agree with Plan of Care  Patient       Patient will benefit from skilled therapeutic intervention in order to improve the following deficits and impairments:  Postural dysfunction, Decreased knowledge of precautions, Decreased knowledge of use of DME, Impaired UE functional use, Decreased range of motion, Decreased strength, Increased fascial restricitons, Decreased scar mobility  Visit Diagnosis: Aftercare following surgery for neoplasm  Stiffness of left shoulder, not elsewhere classified  Abnormal posture     Problem List Patient Active Problem List   Diagnosis Date Noted  . Port-A-Cath in place 04/14/2017  . Stage II breast cancer, left ([place]) 02/23/2017  . Carcinoma of upper-outer quadrant of left breast in female, estrogen receptor positive ([place]) 02/10/2017  . Carcinoma of upper-inner quadrant of left breast in female, estrogen receptor positive ([place]) 02/10/2017  . Chest pain 07/18/2015  . Hypokalemia 07/18/2015  . Vertigo 07/18/2015  . LBBB (left bundle branch block) 05/28/2009  . AODM 12/21/2007  . Essential hypertension, benign 12/21/2007  . DIASTOLIC DYSFUNCTION 16/38/4665  . FEMORAL BRUIT, RIGHT 12/21/2007  . DYSPNEA 12/21/2007    Myan Suit 07/30/2017, 12:40 PM  [place], [place], [address] Phone: [phone]   Fax:  [phone]  Name: Penny Huynh MRN: [id number] Date of Birth: July 23, 1933  Serafina Royals, PT 07/30/17 12:40 PM

## 2017-08-02 ENCOUNTER — Ambulatory Visit
Admission: RE | Admit: 2017-08-02 | Discharge: 2017-08-02 | Disposition: A | Discharge status: Home / Self Care | Payer: Medicare Other | Source: Ambulatory Visit | Attending: Radiation Oncology | Admitting: Radiation Oncology

## 2017-08-02 DIAGNOSIS — Z170 Estrogen receptor positive status [ER+]: Secondary | ICD-10-CM | POA: Diagnosis not present

## 2017-08-02 DIAGNOSIS — C50212 Malignant neoplasm of upper-inner quadrant of left female breast: Secondary | ICD-10-CM | POA: Insufficient documentation

## 2017-08-02 DIAGNOSIS — Z510 Encounter for antineoplastic radiation therapy: Secondary | ICD-10-CM | POA: Diagnosis not present

## 2017-08-03 ENCOUNTER — Ambulatory Visit: Payer: Medicare Other | Attending: General Surgery | Admitting: Physical Therapy

## 2017-08-03 ENCOUNTER — Ambulatory Visit
Admission: RE | Admit: 2017-08-03 | Discharge: 2017-08-03 | Disposition: A | Discharge status: Home / Self Care | Payer: Medicare Other | Source: Ambulatory Visit | Attending: Radiation Oncology | Admitting: Radiation Oncology

## 2017-08-03 DIAGNOSIS — R2681 Unsteadiness on feet: Secondary | ICD-10-CM | POA: Diagnosis not present

## 2017-08-03 DIAGNOSIS — M25612 Stiffness of left shoulder, not elsewhere classified: Secondary | ICD-10-CM | POA: Diagnosis not present

## 2017-08-03 DIAGNOSIS — Z510 Encounter for antineoplastic radiation therapy: Secondary | ICD-10-CM | POA: Diagnosis not present

## 2017-08-03 DIAGNOSIS — Z483 Aftercare following surgery for neoplasm: Secondary | ICD-10-CM | POA: Diagnosis not present

## 2017-08-03 DIAGNOSIS — Z170 Estrogen receptor positive status [ER+]: Secondary | ICD-10-CM | POA: Insufficient documentation

## 2017-08-03 DIAGNOSIS — R209 Unspecified disturbances of skin sensation: Secondary | ICD-10-CM | POA: Insufficient documentation

## 2017-08-03 DIAGNOSIS — C50912 Malignant neoplasm of unspecified site of left female breast: Secondary | ICD-10-CM | POA: Insufficient documentation

## 2017-08-03 DIAGNOSIS — C50212 Malignant neoplasm of upper-inner quadrant of left female breast: Secondary | ICD-10-CM | POA: Diagnosis not present

## 2017-08-03 DIAGNOSIS — R293 Abnormal posture: Secondary | ICD-10-CM | POA: Diagnosis not present

## 2017-08-03 NOTE — Therapy (Signed)
Progress Note Reporting Period 07/02/17 to 08/03/17  See note below for Objective Data and Assessment of Progress/Goals.      [place], [place], [address] Phone: [phone]   Fax:  [phone]  Physical Therapy Treatment  Patient Details  Name: Penny Huynh MRN: [id number] Date of Birth: [birth year] Referring Provider: [name]   Encounter Date: 08/03/2017  PT End of Session - 08/03/17 1729    Visit Number  79 KX    Number of Visits  38    Date for PT Re-Evaluation  09/10/17    PT Start Time  1347    PT Stop Time  1435    PT Time Calculation (min)  48 min    Activity Tolerance  Patient tolerated treatment well    Behavior During Therapy  [facility] for tasks assessed/performed       Past Medical History:  Diagnosis Date  . Arthritis    "maybe a little in my left knee" (02/23/2017)  . Asthmatic bronchitis   . Cancer of left breast ([place])   . Coronary atherosclerosis    Minor nonobstructive CAD at cardiac catheterization 2009  . Dyslipidemia   . Femoral bruit 7/08   With normal ABIs  . Hypertension   . Lower extremity edema    Chronic  . Pneumonia ~ 1950   in 8th grade  . Type II diabetes mellitus ([place])     Past Surgical History:  Procedure Laterality Date  . APPENDECTOMY    . BREAST BIOPSY Left 02/2017  . CARDIAC CATHETERIZATION     "long time ago" (02/23/2017)  . MASTECTOMY COMPLETE / SIMPLE W/ SENTINEL NODE BIOPSY Left 02/23/2017   TOTAL MATECTOMY;  LEFT AXILLARY SENTINEL LYMPH NODE BIOPSY ERAS PATHWAY  . MASTECTOMY WITH RADIOACTIVE SEED GUIDED EXCISION AND AXILLARY SENTINEL LYMPH NODE BIOPSY Left 02/23/2017   Procedure: LEFT TOTAL MATECTOMY;  LEFT AXILLARY SENTINEL LYMPH NODE BIOPSY ERAS PATHWAY;  Surgeon: Excell Seltzer, MD;  Location: [facility];  Service: General;  Laterality: Left;  . PILONIDAL CYST EXCISION    . PORTA CATH INSERTION  02/23/2017  . PORTACATH  PLACEMENT N/A 02/23/2017   Procedure: INSERTION PORT-A-CATH;  Surgeon: Excell Seltzer, MD;  Location: [facility];  Service: General;  Laterality: N/A;  . SHOULDER ARTHROSCOPY W/ ROTATOR CUFF REPAIR Right     There were no vitals filed for this visit.  Subjective Assessment - 08/03/17 1349    Subjective  Haven't been able to work on the balance exercises, my feet have been hurting so bad. Had radiation this morning.    Pertinent History  Patient underwent a left mastectomy and sentinel node biopsy (6 negative nodes) on 02/23/17. She will begin chemotherapy 03/31/17 as long as drain is removed today. She has 2 different breast cancers. Both are ER/PR positive but one is HER2 negative and one is HER2 positive. History of right shoulder scope 20 years ago and has diabetes and hypertension, both of which are well controlled.    Patient Stated Goals  See if my arm is ok    Currently in Pain?  Yes    Pain Score  9     Pain Location  Foot    Pain Orientation  Right;Left    Pain Descriptors / Indicators  Burning    Aggravating Factors   unsure    Pain Relieving Factors  Tramadol may help, but she has run out  [facility] PT Assessment - 08/03/17 0001      AROM   Left Shoulder ABduction  153 Degrees after stretching                   [facility] Adult PT Treatment/Exercise - 08/03/17 0001      Manual Therapy   Soft tissue mobilization  to incision area at left chest in all directions tender today just superior to incision line    Myofascial Release  across left axilla area in various directions and with various hand holds    Passive ROM  in supine to left shoulder into er, abduction (prolonged stretch), and flexion                  PT Long Term Goals - 08/03/17 1735      PT LONG TERM GOAL #1   Title  Patient will be independent in her home exercise program for shoulder ROM.    Status  Achieved      PT LONG TERM GOAL #2   Title  Patient will demonstrate shoulder flexion  AROM to be >/= 140 degrees for increased ease reaching overhead.    Status  Achieved      PT LONG TERM GOAL #3   Title  Patient will demonstrate shoulder abduction AROM to be >/= 140 degrees for increased ease reaching overhead.    Status  Achieved      PT LONG TERM GOAL #4   Title  Patient will score </= 10 on the DASH indicating increased shoulder function.    Status  Achieved      PT LONG TERM GOAL #5   Title  Patient will verbalize she understands lymphedema risk reduction.    Status  Achieved      Additional Long Term Goals   Additional Long Term Goals  Yes      PT LONG TERM GOAL #6   Title  Left shoulder abduction to at least 160 degrees    Baseline  146 on 06/18/17; 156 degrees-07/13/17    Status  On-going      PT LONG TERM GOAL #7   Title  Pt. will be independent with home exercise for balance.    Baseline  On 08/03/17, she is unable to do these exercises due to foot pain from worsening neuropathy.    Status  Deferred      PT LONG TERM GOAL #8   Title  Pt. will report at least 25% decrease in foot pain with ability to do self-management for this    Baseline  9/10 on 08/03/17    Time  [facility] Goals - 02/08/17 2024      Patient will be able to verbalize understanding of pertinent lymphedema risk reduction practices relevant to her diagnosis specifically related to skin care.   Status  Achieved      Patient will be able to return demonstrate and/or verbalize understanding of the post-op home exercise program related to regaining shoulder range of motion.   Status  Achieved      Patient will be able to verbalize understanding of the importance of attending the postoperative After Breast Cancer Class for further lymphedema risk reduction education and therapeutic exercise.   Status  Achieved           Plan - 08/03/17 1730    Clinical Impression Statement  Pt. came in  again today reporting her foot neuropathy pain was  really bothering her and worsening. She is using the cane. We were unable to work on balance because her feet are so uncomfortable. Focused again on soft tissue mobilization, P/ROM of left chest/shoulder area. She still has significant pect tightness and has not quite met her left shoulder abduction revised goal.    Rehab Potential  Excellent    PT Frequency  2x / week    PT Duration  4 weeks if patient chooses to continue this long    PT Treatment/Interventions  ADLs/Self Care Home Management;Therapeutic exercise;Patient/family education;Orthotic Fit/Training;Manual techniques;Passive range of motion;Manual lymph drainage;Scar mobilization;DME Instruction;Electrical Stimulation;Moist Heat;Contrast Bath    PT Next Visit Plan  If okayed by MD, try electrical stimulation or other techniques for her foot neuropathy. Resume balance exercises once her feet are less painful. Continue left shoulder ROM workl    PT Home Exercise Plan  focus on stretches to try to release cording; supine scapular series  Pt. returned to her previous exercise program at her church as of 05/17/17; horizontal abduction stretch in supine; standing leg raises, other balance exercises    Consulted and Agree with Plan of Care  Patient       Patient will benefit from skilled therapeutic intervention in order to improve the following deficits and impairments:  Postural dysfunction, Decreased knowledge of precautions, Decreased knowledge of use of DME, Impaired UE functional use, Decreased range of motion, Decreased strength, Increased fascial restricitons, Decreased scar mobility  Visit Diagnosis: Aftercare following surgery for neoplasm - Plan: PT plan of care cert/re-cert  Stiffness of left shoulder, not elsewhere classified - Plan: PT plan of care cert/re-cert  Abnormal posture - Plan: PT plan of care cert/re-cert  Unsteadiness on feet - Plan: PT plan of care cert/re-cert  Unspecified disturbances of skin sensation - Plan: PT  plan of care cert/re-cert     Problem List Patient Active Problem List   Diagnosis Date Noted  . Port-A-Cath in place 04/14/2017  . Stage II breast cancer, left ([place]) 02/23/2017  . Carcinoma of upper-outer quadrant of left breast in female, estrogen receptor positive ([place]) 02/10/2017  . Carcinoma of upper-inner quadrant of left breast in female, estrogen receptor positive ([place]) 02/10/2017  . Chest pain 07/18/2015  . Hypokalemia 07/18/2015  . Vertigo 07/18/2015  . LBBB (left bundle branch block) 05/28/2009  . AODM 12/21/2007  . Essential hypertension, benign 12/21/2007  . DIASTOLIC DYSFUNCTION 55/37/4827  . FEMORAL BRUIT, RIGHT 12/21/2007  . DYSPNEA 12/21/2007    SALISBURY,DONNA 08/03/2017, 5:40 PM  [place], [place], [address] Phone: [phone]   Fax:  [phone]  Name: RUTHANNA MACCHIA MRN: [id number] Date of Birth: 07/25/1933  Serafina Royals, PT 08/03/17 5:41 PM

## 2017-08-04 ENCOUNTER — Inpatient Hospital Stay: Payer: Medicare Other

## 2017-08-04 ENCOUNTER — Inpatient Hospital Stay: Payer: Medicare Other | Attending: Nurse Practitioner

## 2017-08-04 ENCOUNTER — Ambulatory Visit
Admission: RE | Admit: 2017-08-04 | Discharge: 2017-08-04 | Disposition: A | Discharge status: Home / Self Care | Payer: Medicare Other | Source: Ambulatory Visit | Attending: Radiation Oncology | Admitting: Radiation Oncology

## 2017-08-04 ENCOUNTER — Telehealth: Payer: Self-pay | Admitting: Hematology

## 2017-08-04 ENCOUNTER — Inpatient Hospital Stay (HOSPITAL_BASED_OUTPATIENT_CLINIC_OR_DEPARTMENT_OTHER): Payer: Medicare Other | Admitting: Nurse Practitioner

## 2017-08-04 ENCOUNTER — Encounter: Payer: Self-pay | Admitting: Nurse Practitioner

## 2017-08-04 VITALS — BP 142/66 | HR 79 | Temp 98.0°F | Resp 19 | Ht 64.5 in | Wt 153.3 lb

## 2017-08-04 DIAGNOSIS — E119 Type 2 diabetes mellitus without complications: Secondary | ICD-10-CM | POA: Insufficient documentation

## 2017-08-04 DIAGNOSIS — Z170 Estrogen receptor positive status [ER+]: Secondary | ICD-10-CM | POA: Insufficient documentation

## 2017-08-04 DIAGNOSIS — I2510 Atherosclerotic heart disease of native coronary artery without angina pectoris: Secondary | ICD-10-CM | POA: Insufficient documentation

## 2017-08-04 DIAGNOSIS — Z5111 Encounter for antineoplastic chemotherapy: Secondary | ICD-10-CM | POA: Insufficient documentation

## 2017-08-04 DIAGNOSIS — T451X5A Adverse effect of antineoplastic and immunosuppressive drugs, initial encounter: Secondary | ICD-10-CM

## 2017-08-04 DIAGNOSIS — C50412 Malignant neoplasm of upper-outer quadrant of left female breast: Secondary | ICD-10-CM

## 2017-08-04 DIAGNOSIS — Z79899 Other long term (current) drug therapy: Secondary | ICD-10-CM | POA: Insufficient documentation

## 2017-08-04 DIAGNOSIS — I10 Essential (primary) hypertension: Secondary | ICD-10-CM | POA: Diagnosis not present

## 2017-08-04 DIAGNOSIS — Z95828 Presence of other vascular implants and grafts: Secondary | ICD-10-CM

## 2017-08-04 DIAGNOSIS — E876 Hypokalemia: Secondary | ICD-10-CM | POA: Diagnosis not present

## 2017-08-04 DIAGNOSIS — E871 Hypo-osmolality and hyponatremia: Secondary | ICD-10-CM | POA: Insufficient documentation

## 2017-08-04 DIAGNOSIS — C50212 Malignant neoplasm of upper-inner quadrant of left female breast: Secondary | ICD-10-CM | POA: Diagnosis not present

## 2017-08-04 DIAGNOSIS — G629 Polyneuropathy, unspecified: Secondary | ICD-10-CM | POA: Diagnosis not present

## 2017-08-04 DIAGNOSIS — Z510 Encounter for antineoplastic radiation therapy: Secondary | ICD-10-CM | POA: Diagnosis not present

## 2017-08-04 DIAGNOSIS — G620 Drug-induced polyneuropathy: Secondary | ICD-10-CM

## 2017-08-04 LAB — CMP (CANCER CENTER ONLY)
ALT: 24 U/L (ref 0–44)
AST: 23 U/L (ref 15–41)
Albumin: 3.9 g/dL (ref 3.5–5.0)
Alkaline Phosphatase: 60 U/L (ref 38–126)
Anion gap: 7 (ref 5–15)
BUN: 10 mg/dL (ref 8–23)
CHLORIDE: 90 mmol/L — AB (ref 98–111)
CO2: 30 mmol/L (ref 22–32)
Calcium: 10 mg/dL (ref 8.9–10.3)
Creatinine: 0.72 mg/dL (ref 0.44–1.00)
Glucose, Bld: 64 mg/dL — ABNORMAL LOW (ref 70–99)
POTASSIUM: 3.4 mmol/L — AB (ref 3.5–5.1)
SODIUM: 127 mmol/L — AB (ref 135–145)
Total Bilirubin: 0.6 mg/dL (ref 0.3–1.2)
Total Protein: 6.9 g/dL (ref 6.5–8.1)

## 2017-08-04 LAB — CBC WITH DIFFERENTIAL (CANCER CENTER ONLY)
Basophils Absolute: 0.1 10*3/uL (ref 0.0–0.1)
Basophils Relative: 2 %
EOS ABS: 0.1 10*3/uL (ref 0.0–0.5)
Eosinophils Relative: 4 %
HEMATOCRIT: 33.9 % — AB (ref 34.8–46.6)
HEMOGLOBIN: 11 g/dL — AB (ref 11.6–15.9)
LYMPHS ABS: 1 10*3/uL (ref 0.9–3.3)
LYMPHS PCT: 37 %
MCH: 29.5 pg (ref 25.1–34.0)
MCHC: 32.4 g/dL (ref 31.5–36.0)
MCV: 90.9 fL (ref 79.5–101.0)
Monocytes Absolute: 0.5 10*3/uL (ref 0.1–0.9)
Monocytes Relative: 18 %
NEUTROS PCT: 39 %
Neutro Abs: 1 10*3/uL — ABNORMAL LOW (ref 1.5–6.5)
PLATELETS: 284 10*3/uL (ref 145–400)
RBC: 3.73 MIL/uL (ref 3.70–5.45)
RDW: 13.3 % (ref 11.2–14.5)
WBC: 2.5 10*3/uL — AB (ref 3.9–10.3)

## 2017-08-04 MED ORDER — SODIUM CHLORIDE 0.9% FLUSH
10.0000 mL | INTRAVENOUS | Status: DC | PRN
  Administered 2017-08-04: 10 mL
  Filled 2017-08-04: qty 10

## 2017-08-04 MED ORDER — DIPHENHYDRAMINE HCL 25 MG PO CAPS
ORAL_CAPSULE | ORAL | Status: AC
  Filled 2017-08-04: qty 2

## 2017-08-04 MED ORDER — DIPHENHYDRAMINE HCL 25 MG PO CAPS
50.0000 mg | ORAL_CAPSULE | Freq: Once | ORAL | Status: AC
  Administered 2017-08-04: 50 mg via ORAL

## 2017-08-04 MED ORDER — ACETAMINOPHEN 325 MG PO TABS
650.0000 mg | ORAL_TABLET | Freq: Once | ORAL | Status: AC
  Administered 2017-08-04: 650 mg via ORAL

## 2017-08-04 MED ORDER — SODIUM CHLORIDE 0.9 % IV SOLN
3.6000 mg/kg | Freq: Once | INTRAVENOUS | Status: AC
  Administered 2017-08-04: 260 mg via INTRAVENOUS
  Filled 2017-08-04: qty 8

## 2017-08-04 MED ORDER — HEPARIN SOD (PORK) LOCK FLUSH 100 UNIT/ML IV SOLN
500.0000 [IU] | Freq: Once | INTRAVENOUS | Status: AC | PRN
  Administered 2017-08-04: 500 [IU]
  Filled 2017-08-04: qty 5

## 2017-08-04 MED ORDER — ACETAMINOPHEN 325 MG PO TABS
ORAL_TABLET | ORAL | Status: AC
  Filled 2017-08-04: qty 2

## 2017-08-04 MED ORDER — SODIUM CHLORIDE 0.9 % IV SOLN
Freq: Once | INTRAVENOUS | Status: AC
  Administered 2017-08-04: 15:00:00 via INTRAVENOUS

## 2017-08-04 NOTE — Progress Notes (Signed)
Per Cira Rue, NP, it's OK to treat with an ANC of 1.0. today

## 2017-08-04 NOTE — Telephone Encounter (Signed)
Appointments scheduled patenet to pick up new copy of schedule at next visit per 7/3 los

## 2017-08-04 NOTE — Patient Instructions (Signed)
[facility] Discharge Instructions for Patients Receiving Chemotherapy  Today you received the following chemotherapy agents Kadcycla.  To help prevent nausea and vomiting after your treatment, we encourage you to take your nausea medication as prescribed.   If you develop nausea and vomiting that is not controlled by your nausea medication, call the clinic.   BELOW ARE SYMPTOMS THAT SHOULD BE REPORTED IMMEDIATELY:  *FEVER GREATER THAN 100.5 F  *CHILLS WITH OR WITHOUT FEVER  NAUSEA AND VOMITING THAT IS NOT CONTROLLED WITH YOUR NAUSEA MEDICATION  *UNUSUAL SHORTNESS OF BREATH  *UNUSUAL BRUISING OR BLEEDING  TENDERNESS IN MOUTH AND THROAT WITH OR WITHOUT PRESENCE OF ULCERS  *URINARY PROBLEMS  *BOWEL PROBLEMS  UNUSUAL RASH Items with * indicate a potential emergency and should be followed up as soon as possible.  Feel free to call the clinic should you have any questions or concerns. The clinic phone number is [phone].  Please show the [facility] at check-in to the Emergency Department and triage nurse.

## 2017-08-04 NOTE — Progress Notes (Signed)
[place]  [phone] Fax:([phone]) [phone]  Clinic Follow up Note   Patient Care Team: Sasser, Silvestre Moment, MD as PCP - General (Cardiology) [name], MD as PCP - Cardiology (Cardiology) Truitt Merle, MD as Consulting Physician (Hematology) Excell Seltzer, MD as Consulting Physician (General Surgery) Alla Feeling, NP as Nurse Practitioner (Nurse Practitioner) [name], MD as Attending Physician (Cardiology) 08/04/2017  SUMMARY OF ONCOLOGIC HISTORY: Oncology History   Cancer Staging Carcinoma of upper-inner quadrant of left breast in female, estrogen receptor positive ([place]) Staging form: Breast, AJCC 8th Edition - Clinical stage from 01/18/2017: Stage IB (cT2, cN0, cM0, G3, ER: Positive, PR: Positive, HER2: Positive) - Signed by Truitt Merle, MD on 02/10/2017 - Pathologic stage from 02/23/2017: Stage IB (pT3, pN0, cM0, G3, ER: Positive, PR: Positive, HER2: Positive) - Signed by Alla Feeling, NP on 03/16/2017  Carcinoma of upper-outer quadrant of left breast in female, estrogen receptor positive ([place]) Staging form: Breast, AJCC 8th Edition - Clinical stage from 01/18/2017: Stage IB (cT2, cN0, cM0, G2, ER: Positive, PR: Positive, HER2: Negative) - Unsigned - Pathologic stage from 02/23/2017: Stage IB (pT2, pN0(sn), cM0, G3, ER: Positive, PR: Positive, HER2: Negative) - Signed by Alla Feeling, NP on 03/16/2017       Carcinoma of upper-outer quadrant of left breast in female, estrogen receptor positive ([place])   01/13/2017 Mammogram    FINDINGS: In the left breast, a spiculated mass lies in the upper outer quadrant. In the medial posterior left breast, there is a larger lobulated mass. These masses correspond to the palpable abnormalities. No other discrete left breast masses.  In the right breast, there are no discrete masses. There are no areas of architectural distortion.  In both breasts there are scattered calcifications,  rounded punctate, with a few other larger coarse dystrophic calcifications, without significant change from the previous screening mammogram, which is dated 12/04/2009.  Mammographic images were processed with CAD.  On physical exam, there is a firm palpable mass in the upper outer left breast and another firm mass in the medial left breast.       01/13/2017 Breast US    Targeted ultrasound is performed, showing an irregular hypoechoic shadowing mass in the left breast at the 2:30 o'clock position, 5 cm the nipple, measuring 3.3 x 2.8 x 2.6 cm. In the 10:30 o'clock position of the left breast, 5 cm the nipple, there is irregular hypoechoic mass with somewhat more lobulated margins, corresponding to the lobulated mass seen mammographically. This measures 4.8 x 2.9 x 4.8 cm. Both masses show internal vascularity on color Doppler analysis.  Sonographic evaluation of the left axilla shows several nodes with thickened cortices. Status cortex measured is 5 mm. None of these lymph nodes appear enlarged but overall size criteria.  IMPRESSION: 1. Two masses in the left breast, 1 at the 2:30 o'clock position in the other at the 10:30 o'clock position, both highly suspicious for breast carcinoma. 2. Several abnormal left axillary lymph nodes with thickened cortices. 3. No evidence of right breast malignancy.       01/18/2017 Pathology Results    Diagnosis 1. Breast, left, needle core biopsy, 2:30 o'clock - INVASIVE MAMMARY CARCINOMA - MAMMARY CARCINOMA IN-SITU - SEE COMMENT 2. Lymph node, needle/core biopsy, left axilla - NO CARCINOMA IDENTIFIED - SEE COMMENT 3. Breast, left, needle core biopsy, 10:30 o'clock - INVASIVE DUCTAL CARCINOMA - SEE COMMENT  1. PROGNOSTIC INDICATORS Results: IMMUNOHISTOCHEMICAL AND MORPHOMETRIC ANALYSIS PERFORMED MANUALLY Estrogen Receptor:  100%, POSITIVE, STRONG STAINING INTENSITY Progesterone Receptor: 60%, POSITIVE, STRONG STAINING  INTENSITY Proliferation Marker Ki67: 60%  1. FLUORESCENCE IN-SITU HYBRIDIZATION Results: HER2 - NEGATIVE RATIO OF HER2/CEP17 SIGNALS 1.28 AVERAGE HER2 COPY NUMBER PER CELL 2.75  3. PROGNOSTIC INDICATORS Results: IMMUNOHISTOCHEMICAL AND MORPHOMETRIC ANALYSIS PERFORMED MANUALLY Estrogen Receptor: 100%, POSITIVE, STRONG STAINING INTENSITY Progesterone Receptor: 50%, POSITIVE, STRONG STAINING INTENSITY Proliferation Marker Ki67: 70%  3. FLUORESCENCE IN-SITU HYBRIDIZATION Results: HER2 - **POSITIVE** RATIO OF HER2/CEP17 SIGNALS 2.80 AVERAGE HER2 COPY NUMBER PER CELL 6.15  Microscopic Comment 1. The biopsy material shows an infiltrative proliferation of cells with large vesicular nuclei with inconspicuous nucleoli, arranged linearly and in small clusters. Based on the biopsy, the carcinoma appears Nottingham grade 2 of 3 and measures 0.9 cm in greatest linear extent. E-cadherin and prognostic markers (ER/PR/ki-67/HER2-FISH)are pending and will be reported in an addendum. Dr. Lyndon Code reviewed the case and agrees with the above diagnosis.      02/10/2017 Initial Diagnosis    Carcinoma of upper-outer quadrant of left breast in female, estrogen receptor positive ([place])      02/19/2017 Imaging    Bone Scan whole Body 02/19/17 IMPRESSION: Negative for evidence of bony metastatic disease.      02/19/2017 Imaging    CT CAP W Contrast 02/19/17 IMPRESSION: 1. Small right middle lobe pulmonary nodules up to 0.8 cm, nonspecific. Non-contrast chest CT at 3-6 months is recommended. If the nodules are stable at time of repeat CT, then future CT at 18-24 months (from today's scan) is considered optional for low-risk patients, but is recommended for high-risk patients. This recommendation follows the consensus statement: Guidelines for Management of Incidental Pulmonary Nodules Detected on CT Images: From the Fleischner Society 2017; Radiology 2017; 284:228-243. 2. Subcentimeter hepatic lesions  likely cysts but incompletely characterized due to small size. Attention on follow-up imaging recommended. 3. 2.1 cm fusiform right common iliac artery aneurysm. Continued surveillance recommended. 4. Aortic Atherosclerosis (ICD10-I70.0) and Emphysema (ICD10-J43.9).      02/23/2017 Pathology Results    Diagnosis 1. Breast, simple mastectomy, Left Total - INVASIVE DUCTAL CARCINOMA, MULTIFOCAL, NOTTINGHAM GRADE 3/3 (5.3 CM, 3.5 CM) - INVASIVE CARCINOMA INVOLVES THE DERMIS - DUCTAL CARCINOMA IN SITU, INTERMEDIATE GRADE - HYALINIZED FIBROADENOMA - MARGINS UNINVOLVED BY CARCINOMA - NO CARCINOMA IDENTIFIED IN TWO LYMPH NODES (0/2) - SEE ONCOLOGY TABLE AND COMMENT BELOW 2. Lymph node, sentinel, biopsy, Left Axillary - NO CARCINOMA IDENTIFIED IN ONE LYMPH NODE (0/1) 3. Lymph node, sentinel, biopsy - NO CARCINOMA IDENTIFIED IN ONE LYMPH NODE (0/1) 4. Lymph node, sentinel, biopsy - NO CARCINOMA IDENTIFIED IN ONE LYMPH NODE (0/1) 5. Lymph node, sentinel, biopsy - NO CARCINOMA IDENTIFIED IN ONE LYMPH NODE (0/1) Microscopic Comment 1. BREAST, INVASIVE TUMOR Procedure: Simple mastectomy Laterality: Left Tumor Size: 5. 3 cm, 3.5 cm Histologic Type: Invasive carcinoma of no special type (ductal, not otherwise specified) Grade: Nottingham Grade 3 Tubular Differentiation: 3 Nuclear Pleomorphism: 3 Mitotic Count: 2 Ductal Carcinoma in Situ (DCIS): Present Extent of Tumor: Skin: Invasive carcinoma directly invades into the dermis or epidermis without skin ulceration Margins: Invasive carcinoma, distance from closest margin: 1.5 cm (posterior) DCIS, distance from closest margin: > 1 cm Regional Lymph Nodes: Number of Lymph Nodes Examined: 6 Number of Sentinel Lymph Nodes Examined: 4 Lymph Nodes with Macrometastases: 0 Lymph Nodes with Micrometastases: 0 Lymph Nodes with Isolated Tumor Cells: 0 Treatment effect: No known presurgical therapy Breast Prognostic Profile: See Also  ([id number]) Estrogen Receptor: Positive (100%, strong); Positive (100%, strong) Progesterone Receptor: Positive (  50%, strong); Positive (50%, strong) Her2: Positive (Ratio 2.80); Negative (Ratio 1.28) Ki-67: 70%; 60% Best tumor block for sendout testing: 1E Pathologic Stage Classification (pTNM, AJCC 8th Edition): Primary Tumor: mpT3 Regional Lymph Nodes: pN0 COMMENT: The two invasive carcinomas in the breast have slightly different morphologies. The larger lesion has a papillary and micropapillary pattern admixed with typical ductal carcinoma while the smaller lesion is more typical of a ductal lesion with linear arrays (E-cadherin performed on biopsy). There are foci within the larger lesion that are concerning for lymphovascular space invasion.       04/07/2017 -  Chemotherapy    weekly taxol and herceptin x12 weeks then herceptin q3 weeks for total 1 year       07/13/2017 -  Chemotherapy    The patient had ado-trastuzumab emtansine (KADCYLA) 260 mg in sodium chloride 0.9 % 250 mL chemo infusion, 3.6 mg/kg = 260 mg, Intravenous, Once, 2 of 5 cycles Administration: 260 mg (07/14/2017)  for chemotherapy treatment.      CURRENT THERAPY:  weekly taxol and Herceptin x12 weeks beginning 04/07/17 then maintenance Kadcyla for total 1 year. Adjuvant radiation per Dr. Lisbeth Renshaw started 07/20/17; pending adjuvant anastrozole after radiation    INTERVAL HISTORY: Ms. Sliva returns for follow up and cycle 2 Kadcyla as scheduled. She began radiation per Dr. Lisbeth Renshaw on 6/18. She feels tired, but well overall. Has burning in her feet, began gabapentin a few days ago. Uses a cane for altered balance. Has less neuropathy in her fingers, takes more time and effort to button bra, clothes etc. She will finish PT for lymphedema soon. Has occasional constipation, managed with stool softener and prunes.   REVIEW OF SYSTEMS:   Constitutional: Denies fevers, chills or abnormal weight loss (+) fatigue  Eyes: Denies  blurriness of vision Ears, nose, mouth, throat, and face: Denies mucositis or sore throat Respiratory: Denies cough, dyspnea or wheezes Cardiovascular: Denies palpitation, chest discomfort or lower extremity swelling Gastrointestinal:  Denies nausea, vomiting, diarrhea, heartburn or change in bowel habits (+) constipation  Skin: Denies abnormal skin rashes Lymphatics: Denies new lymphadenopathy or easy bruising Neurological:Denies new weaknesses (+) neuropathy feet > fingers  Behavioral/Psych: Mood is stable, no new changes  All other systems were reviewed with the patient and are negative.  MEDICAL HISTORY:  Past Medical History:  Diagnosis Date  . Arthritis    "maybe a little in my left knee" (02/23/2017)  . Asthmatic bronchitis   . Cancer of left breast ([place])   . Coronary atherosclerosis    Minor nonobstructive CAD at cardiac catheterization 2009  . Dyslipidemia   . Femoral bruit 7/08   With normal ABIs  . Hypertension   . Lower extremity edema    Chronic  . Pneumonia ~ 1950   in 8th grade  . Type II diabetes mellitus ([place])     SURGICAL HISTORY: Past Surgical History:  Procedure Laterality Date  . APPENDECTOMY    . BREAST BIOPSY Left 02/2017  . CARDIAC CATHETERIZATION     "long time ago" (02/23/2017)  . MASTECTOMY COMPLETE / SIMPLE W/ SENTINEL NODE BIOPSY Left 02/23/2017   TOTAL MATECTOMY;  LEFT AXILLARY SENTINEL LYMPH NODE BIOPSY ERAS PATHWAY  . MASTECTOMY WITH RADIOACTIVE SEED GUIDED EXCISION AND AXILLARY SENTINEL LYMPH NODE BIOPSY Left 02/23/2017   Procedure: LEFT TOTAL MATECTOMY;  LEFT AXILLARY SENTINEL LYMPH NODE BIOPSY ERAS PATHWAY;  Surgeon: Excell Seltzer, MD;  Location: [facility];  Service: General;  Laterality: Left;  . PILONIDAL CYST EXCISION    .  PORTA CATH INSERTION  02/23/2017  . PORTACATH PLACEMENT N/A 02/23/2017   Procedure: INSERTION PORT-A-CATH;  Surgeon: Excell Seltzer, MD;  Location: [facility];  Service: General;  Laterality: N/A;  . SHOULDER  ARTHROSCOPY W/ ROTATOR CUFF REPAIR Right     I have reviewed the social history and family history with the patient and they are unchanged from previous note.  ALLERGIES:  has No Known Allergies.  MEDICATIONS:  Current Outpatient Medications  Medication Sig Dispense Refill  . amLODipine (NORVASC) 5 MG tablet Take 1 tablet (5 mg total) by mouth daily. 30 tablet 0  . aspirin 81 MG EC tablet Take 81 mg by mouth at bedtime.     . chlorthalidone (HYGROTON) 25 MG tablet Take 25 mg by mouth daily.     . Coenzyme Q10 (COQ10) 100 MG CAPS Take 100 mg by mouth daily.      Marland Kitchen gabapentin (NEURONTIN) 100 MG capsule TAKE 1 CAPSULE BY MOUTH AT BEDTIME FOR 14 DAYS THEN TAKE 2 CAPSULES ONCE DAILY AT BEDTIME  1  . latanoprost (XALATAN) 0.005 % ophthalmic solution Place 1 drop into both eyes at bedtime.    Marland Kitchen losartan (COZAAR) 50 MG tablet Take 50 mg by mouth daily.    . metFORMIN (GLUCOPHAGE) 500 MG tablet Take 1,000 mg by mouth daily with breakfast.     . vitamin B-12 (CYANOCOBALAMIN) 1000 MCG tablet Take 1,000 mcg by mouth daily.    . traMADol (ULTRAM) 50 MG tablet Take 1 tablet (50 mg total) by mouth every 6 (six) hours as needed (mild pain). (Patient not taking: Reported on 08/04/2017) 15 tablet 0   No current facility-administered medications for this visit.    Facility-Administered Medications Ordered in Other Visits  Medication Dose Route Frequency Provider Last Rate Last Dose  . 0.9 %  sodium chloride infusion   Intravenous Once Truitt Merle, MD      . acetaminophen (TYLENOL) tablet 650 mg  650 mg Oral Once Truitt Merle, MD      . ado-trastuzumab emtansine [facility]) 260 mg in sodium chloride 0.9 % 250 mL chemo infusion  3.6 mg/kg (Treatment Plan Recorded) Intravenous Once Truitt Merle, MD      . diphenhydrAMINE (BENADRYL) capsule 50 mg  50 mg Oral Once Truitt Merle, MD      . heparin lock flush 100 unit/mL  500 Units Intracatheter Once PRN Truitt Merle, MD      . sodium chloride flush (NS) 0.9 % injection 10 mL  10  mL Intracatheter PRN Truitt Merle, MD        PHYSICAL EXAMINATION: ECOG PERFORMANCE STATUS: 1-2   Vitals:   08/04/17 1335  BP: (!) 142/66  Pulse: 79  Resp: 19  Temp: 98 F (36.7 C)  SpO2: 100%   Filed Weights   08/04/17 1335  Weight: 153 lb 4.8 oz (69.5 kg)    GENERAL:alert, no distress and comfortable SKIN:  no rashes or significant lesions EYES: normal, Conjunctiva are pink and non-injected, sclera clear OROPHARYNX:no thrush or ulcers   LYMPH:  no palpable cervical, supraclavicular, or axillary lymphadenopathy LUNGS: clear to auscultation with normal breathing effort HEART: regular rate & rhythm and no murmurs and no lower extremity edema ABDOMEN:abdomen soft, non-tender and normal bowel sounds Musculoskeletal:no cyanosis of digits and no clubbing  NEURO: alert & oriented x 3 with fluent speech, no focal motor deficits. Mild - moderately decreased peripheral vibratory sense per tuning fork exam   BREAST: inspection shows left mastectomy with mild hyperpigmentation to left  axilla PAC without erythema   LABORATORY DATA:  I have reviewed the data as listed CBC Latest Ref Rng & Units 08/04/2017 07/14/2017 06/23/2017  WBC 3.9 - 10.3 K/uL 2.5(L) 3.3(L) 3.6(L)  Hemoglobin 11.6 - 15.9 g/dL 11.0(L) 10.8(L) 10.0(L)  Hematocrit 34.8 - 46.6 % 33.9(L) 32.9(L) 29.7(L)  Platelets 145 - 400 K/uL 284 233 297     CMP Latest Ref Rng & Units 08/04/2017 07/14/2017 06/23/2017  Glucose 70 - 99 mg/dL 64(L) 85 75  BUN 8 - 23 mg/dL _0 Creatinine 0.44 - 1.00 mg/dL 0.72 0.71 0.69  Sodium 135 - 145 mmol/L 127(L) 130(L) 131(L)  Potassium 3.5 - 5.1 mmol/L 3.4(L) 3.6 4.0  Chloride 98 - 111 mmol/L 90(L) 94(L) 97(L)  CO2 22 - 32 mmol/L _1 Calcium 8.9 - 10.3 mg/dL 10.0 9.5 9.3  Total Protein 6.5 - 8.1 g/dL 6.9 6.6 6.5  Total Bilirubin 0.3 - 1.2 mg/dL 0.6 0.7 0.5  Alkaline Phos 38 - 126 U/L 60 53 52  AST 15 - 41 U/L _2 ALT 0 - 44 U/L _3 RADIOGRAPHIC STUDIES: I have  personally reviewed the radiological images as listed and agreed with the findings in the report. No results found.   ASSESSMENT & PLAN: Penny Huynh is an 84 y.o. female with past medical history positive for HTN, DM, CAD now with newly diagnosed left breast cancer  1.Multifocal invasive ductal carcinoma of the left breast of female;carcinoma of the upper-outer quadrant of left breast and female, ER/PR positive, HER-2 negative pT2N0M0, G2, Stage 1B; IDC of the upper-inner quadrant left breast, ER+/PR+/HER2+, pT3N0M0 G3, stage IB -Penny Huynh appears stable.  She completed cycle 1 Kadcyla on 6/12, she tolerated very well.  Echo is up-to-date.  -Labs reviewed, she is neutropenic with ANC 1.0.  CMP is stable overall.  Labs adequate for treatment.  Proceed with cycle 2 Kadcyla today, continue every 3 weeks -We will see her in 3 weeks, she will be nearing the end of radiation.  We will discuss further adjuvant treatment with AI at that time.  2. HTN, CAD, DM -BP improved today, on amlodipine, chlorthalidone, and losartan. -BG 64 today, she reports she ate a few hours before labs.  She is asymptomatic.  On metformin twice daily.  She monitors blood sugar at home which ranges in the 120s.  I encouraged her to keep a close eye on blood sugar and call PCP for possible medication adjustment.  3. Hyponatremia  -Appears chronic, stable, likely related to diuretic.  Na 127 today.  Monitoring closely  4. Peripheral neuropathy, G2, secondary to taxol and DM -She began gabapentin 100 mg once daily few days ago, per instructions she plans to increase to 200 mg qHS after 2 weeks.  I recommend a B complex vitamin for nerve health.  She uses a cane. PT mentioned possible intervention for peripheral neuropathy, which she is still considering.    PLAN -Labs reviewed, proceed with cycle 2 Kadcyla (OK to treat with ANC 1.0) -f/u in 3 weeks with cycle 3 Kadcyla, will discuss AI at that time -Trial of B  complex vitamin for nerve health  All questions were answered. The patient knows to call the clinic with any problems, questions or concerns. No barriers to learning was detected.     Alla Feeling, NP 08/04/17

## 2017-08-06 ENCOUNTER — Ambulatory Visit: Payer: Medicare Other | Admitting: Physical Therapy

## 2017-08-06 ENCOUNTER — Ambulatory Visit
Admission: RE | Admit: 2017-08-06 | Discharge: 2017-08-06 | Disposition: A | Discharge status: Home / Self Care | Payer: Medicare Other | Source: Ambulatory Visit | Attending: Radiation Oncology | Admitting: Radiation Oncology

## 2017-08-06 ENCOUNTER — Encounter: Payer: Self-pay | Admitting: Physical Therapy

## 2017-08-06 DIAGNOSIS — R209 Unspecified disturbances of skin sensation: Secondary | ICD-10-CM | POA: Diagnosis not present

## 2017-08-06 DIAGNOSIS — M25612 Stiffness of left shoulder, not elsewhere classified: Secondary | ICD-10-CM

## 2017-08-06 DIAGNOSIS — Z510 Encounter for antineoplastic radiation therapy: Secondary | ICD-10-CM | POA: Diagnosis not present

## 2017-08-06 DIAGNOSIS — R293 Abnormal posture: Secondary | ICD-10-CM | POA: Diagnosis not present

## 2017-08-06 DIAGNOSIS — Z483 Aftercare following surgery for neoplasm: Secondary | ICD-10-CM

## 2017-08-06 DIAGNOSIS — Z170 Estrogen receptor positive status [ER+]: Secondary | ICD-10-CM | POA: Diagnosis not present

## 2017-08-06 DIAGNOSIS — R2681 Unsteadiness on feet: Secondary | ICD-10-CM | POA: Diagnosis not present

## 2017-08-06 DIAGNOSIS — C50212 Malignant neoplasm of upper-inner quadrant of left female breast: Secondary | ICD-10-CM | POA: Diagnosis not present

## 2017-08-06 DIAGNOSIS — C50912 Malignant neoplasm of unspecified site of left female breast: Secondary | ICD-10-CM | POA: Diagnosis not present

## 2017-08-06 NOTE — Therapy (Signed)
[place], [place], [address] Phone: [phone]   Fax:  [phone]  Physical Therapy Treatment  Patient Details  Name: Penny Huynh MRN: [id number] Date of Birth: [birth year] Referring Provider: [name]   Encounter Date: 08/06/2017  PT End of Session - 08/06/17 0930    Visit Number  31 [id number]    Number of Visits  38    Date for PT Re-Evaluation  09/10/17    PT Start Time  2446    PT Stop Time  0930    PT Time Calculation (min)  43 min    Activity Tolerance  Patient tolerated treatment well    Behavior During Therapy  [facility] for tasks assessed/performed       Past Medical History:  Diagnosis Date  . Arthritis    "maybe a little in my left knee" (02/23/2017)  . Asthmatic bronchitis   . Cancer of left breast ([place])   . Coronary atherosclerosis    Minor nonobstructive CAD at cardiac catheterization 2009  . Dyslipidemia   . Femoral bruit 7/08   With normal ABIs  . Hypertension   . Lower extremity edema    Chronic  . Pneumonia ~ 1950   in 8th grade  . Type II diabetes mellitus ([place])     Past Surgical History:  Procedure Laterality Date  . APPENDECTOMY    . BREAST BIOPSY Left 02/2017  . CARDIAC CATHETERIZATION     "long time ago" (02/23/2017)  . MASTECTOMY COMPLETE / SIMPLE W/ SENTINEL NODE BIOPSY Left 02/23/2017   TOTAL MATECTOMY;  LEFT AXILLARY SENTINEL LYMPH NODE BIOPSY ERAS PATHWAY  . MASTECTOMY WITH RADIOACTIVE SEED GUIDED EXCISION AND AXILLARY SENTINEL LYMPH NODE BIOPSY Left 02/23/2017   Procedure: LEFT TOTAL MATECTOMY;  LEFT AXILLARY SENTINEL LYMPH NODE BIOPSY ERAS PATHWAY;  Surgeon: Excell Seltzer, MD;  Location: [place];  Service: General;  Laterality: Left;  . PILONIDAL CYST EXCISION    . PORTA CATH INSERTION  02/23/2017  . PORTACATH PLACEMENT N/A 02/23/2017   Procedure: INSERTION PORT-A-CATH;  Surgeon: Excell Seltzer, MD;  Location: [place];  Service: General;   Laterality: N/A;  . SHOULDER ARTHROSCOPY W/ ROTATOR CUFF REPAIR Right     There were no vitals filed for this visit.  Subjective Assessment - 08/06/17 0848    Subjective  I started the gabapentin but I can not tell a difference yet. I have not been able to do any work on my walking or balance because my feet are so painful.     Pertinent History  Patient underwent a left mastectomy and sentinel node biopsy (6 negative nodes) on 02/23/17. She will begin chemotherapy 03/31/17 as long as drain is removed today. She has 2 different breast cancers. Both are ER/PR positive but one is HER2 negative and one is HER2 positive. History of right shoulder scope 20 years ago and has diabetes and hypertension, both of which are well controlled.    Patient Stated Goals  See if my arm is ok    Currently in Pain?  Yes    Pain Score  8     Pain Location  Foot    Pain Orientation  Right;Left    Pain Descriptors / Indicators  Burning    Pain Frequency  Constant                       [facility] Adult PT Treatment/Exercise - 08/06/17 0001  Manual Therapy   Soft tissue mobilization  to incision area at left chest in all directions tender today just superior to incision line    Myofascial Release  across left axilla area in various directions and with various hand holds    Passive ROM  in supine to left shoulder into er, abduction (prolonged stretch), and flexion                  PT Long Term Goals - 08/03/17 1735      PT LONG TERM GOAL #1   Title  Patient will be independent in her home exercise program for shoulder ROM.    Status  Achieved      PT LONG TERM GOAL #2   Title  Patient will demonstrate shoulder flexion AROM to be >/= 140 degrees for increased ease reaching overhead.    Status  Achieved      PT LONG TERM GOAL #3   Title  Patient will demonstrate shoulder abduction AROM to be >/= 140 degrees for increased ease reaching overhead.    Status  Achieved      PT LONG  TERM GOAL #4   Title  Patient will score </= 10 on the DASH indicating increased shoulder function.    Status  Achieved      PT LONG TERM GOAL #5   Title  Patient will verbalize she understands lymphedema risk reduction.    Status  Achieved      Additional Long Term Goals   Additional Long Term Goals  Yes      PT LONG TERM GOAL #6   Title  Left shoulder abduction to at least 160 degrees    Baseline  146 on 06/18/17; 156 degrees-07/13/17    Status  On-going      PT LONG TERM GOAL #7   Title  Pt. will be independent with home exercise for balance.    Baseline  On 08/03/17, she is unable to do these exercises due to foot pain from worsening neuropathy.    Status  Deferred      PT LONG TERM GOAL #8   Title  Pt. will report at least 25% decrease in foot pain with ability to do self-management for this    Baseline  9/10 on 08/03/17    Time  [facility] Goals - 02/08/17 2024      Patient will be able to verbalize understanding of pertinent lymphedema risk reduction practices relevant to her diagnosis specifically related to skin care.   Status  Achieved      Patient will be able to return demonstrate and/or verbalize understanding of the post-op home exercise program related to regaining shoulder range of motion.   Status  Achieved      Patient will be able to verbalize understanding of the importance of attending the postoperative After Breast Cancer Class for further lymphedema risk reduction education and therapeutic exercise.   Status  Achieved           Plan - 08/06/17 0930    Clinical Impression Statement  Pt is still having difficulty with her neuropathy. Unable to work on balance due to pain. She will be increasing her dose of neurontin in another week which may help. Focused today on left shoulder ROM and tightness in left pec. Pt still has considerable tightness in left axilla and a tight band in left  axilla which limits motion. She  reports if she forgets to stretch at home she can tell she gets much more tight since beginning radiation.     Rehab Potential  Excellent    PT Frequency  2x / week    PT Duration  4 weeks    PT Treatment/Interventions  ADLs/Self Care Home Management;Therapeutic exercise;Patient/family education;Orthotic Fit/Training;Manual techniques;Passive range of motion;Manual lymph drainage;Scar mobilization;DME Instruction;Electrical Stimulation;Moist Heat;Contrast Bath    PT Next Visit Plan  If okayed by MD, try electrical stimulation or other techniques for her foot neuropathy. Resume balance exercises once her feet are less painful. Continue left shoulder ROM workl    PT Home Exercise Plan  focus on stretches to try to release cording; supine scapular series  Pt. returned to her previous exercise program at her church as of 05/17/17; horizontal abduction stretch in supine; standing leg raises, other balance exercises    Consulted and Agree with Plan of Care  Patient       Patient will benefit from skilled therapeutic intervention in order to improve the following deficits and impairments:  Postural dysfunction, Decreased knowledge of precautions, Decreased knowledge of use of DME, Impaired UE functional use, Decreased range of motion, Decreased strength, Increased fascial restricitons, Decreased scar mobility  Visit Diagnosis: Aftercare following surgery for neoplasm  Stiffness of left shoulder, not elsewhere classified  Abnormal posture     Problem List Patient Active Problem List   Diagnosis Date Noted  . Port-A-Cath in place 04/14/2017  . Stage II breast cancer, left ([place]) 02/23/2017  . Carcinoma of upper-outer quadrant of left breast in female, estrogen receptor positive ([place]) 02/10/2017  . Carcinoma of upper-inner quadrant of left breast in female, estrogen receptor positive ([place]) 02/10/2017  . Chest pain 07/18/2015  . Hypokalemia 07/18/2015  . Vertigo 07/18/2015  . LBBB (left bundle  branch block) 05/28/2009  . AODM 12/21/2007  . Essential hypertension, benign 12/21/2007  . DIASTOLIC DYSFUNCTION 79/04/8331  . FEMORAL BRUIT, RIGHT 12/21/2007  . DYSPNEA 12/21/2007    Allyson Sabal [facility] 08/06/2017, 9:36 AM  [place], [place], [address] Phone: [phone]   Fax:  [phone]  Name: Penny Huynh MRN: [id number] Date of Birth: 09-24-1933  Manus Gunning, PT 08/06/17 9:36 AM

## 2017-08-09 ENCOUNTER — Ambulatory Visit
Admission: RE | Admit: 2017-08-09 | Discharge: 2017-08-09 | Disposition: A | Discharge status: Home / Self Care | Payer: Medicare Other | Source: Ambulatory Visit | Attending: Radiation Oncology | Admitting: Radiation Oncology

## 2017-08-09 DIAGNOSIS — C50212 Malignant neoplasm of upper-inner quadrant of left female breast: Secondary | ICD-10-CM | POA: Diagnosis not present

## 2017-08-09 DIAGNOSIS — Z510 Encounter for antineoplastic radiation therapy: Secondary | ICD-10-CM | POA: Diagnosis not present

## 2017-08-09 DIAGNOSIS — Z170 Estrogen receptor positive status [ER+]: Secondary | ICD-10-CM | POA: Diagnosis not present

## 2017-08-10 ENCOUNTER — Ambulatory Visit: Payer: Medicare Other

## 2017-08-10 ENCOUNTER — Ambulatory Visit
Admission: RE | Admit: 2017-08-10 | Discharge: 2017-08-10 | Disposition: A | Discharge status: Home / Self Care | Payer: Medicare Other | Source: Ambulatory Visit | Attending: Radiation Oncology | Admitting: Radiation Oncology

## 2017-08-10 DIAGNOSIS — Z483 Aftercare following surgery for neoplasm: Secondary | ICD-10-CM

## 2017-08-10 DIAGNOSIS — R293 Abnormal posture: Secondary | ICD-10-CM

## 2017-08-10 DIAGNOSIS — Z170 Estrogen receptor positive status [ER+]: Secondary | ICD-10-CM | POA: Diagnosis not present

## 2017-08-10 DIAGNOSIS — C50912 Malignant neoplasm of unspecified site of left female breast: Secondary | ICD-10-CM | POA: Diagnosis not present

## 2017-08-10 DIAGNOSIS — M25612 Stiffness of left shoulder, not elsewhere classified: Secondary | ICD-10-CM

## 2017-08-10 DIAGNOSIS — R2681 Unsteadiness on feet: Secondary | ICD-10-CM | POA: Diagnosis not present

## 2017-08-10 DIAGNOSIS — R209 Unspecified disturbances of skin sensation: Secondary | ICD-10-CM | POA: Diagnosis not present

## 2017-08-10 DIAGNOSIS — Z510 Encounter for antineoplastic radiation therapy: Secondary | ICD-10-CM | POA: Diagnosis not present

## 2017-08-10 DIAGNOSIS — C50212 Malignant neoplasm of upper-inner quadrant of left female breast: Secondary | ICD-10-CM | POA: Diagnosis not present

## 2017-08-10 NOTE — Therapy (Signed)
[place], [place], [address] Phone: [phone]   Fax:  [phone]  Physical Therapy Treatment  Patient Details  Name: Penny Huynh MRN: [id number] Date of Birth: 17-Aug-1933 Referring Provider: [name]   Encounter Date: 08/10/2017  PT End of Session - 08/10/17 1235    Visit Number  56 [place]    Number of Visits  38    Date for PT Re-Evaluation  09/10/17    PT Start Time  1120 Pt arrived late due to radiation being behind    PT Stop Time  1202    PT Time Calculation (min)  42 min    Activity Tolerance  Patient tolerated treatment well    Behavior During Therapy  [facility] for tasks assessed/performed       Past Medical History:  Diagnosis Date  . Arthritis    "maybe a little in my left knee" (02/23/2017)  . Asthmatic bronchitis   . Cancer of left breast ([place])   . Coronary atherosclerosis    Minor nonobstructive CAD at cardiac catheterization 2009  . Dyslipidemia   . Femoral bruit 7/08   With normal ABIs  . Hypertension   . Lower extremity edema    Chronic  . Pneumonia ~ 1950   in 8th grade  . Type II diabetes mellitus ([place])     Past Surgical History:  Procedure Laterality Date  . APPENDECTOMY    . BREAST BIOPSY Left 02/2017  . CARDIAC CATHETERIZATION     "long time ago" (02/23/2017)  . MASTECTOMY COMPLETE / SIMPLE W/ SENTINEL NODE BIOPSY Left 02/23/2017   TOTAL MATECTOMY;  LEFT AXILLARY SENTINEL LYMPH NODE BIOPSY ERAS PATHWAY  . MASTECTOMY WITH RADIOACTIVE SEED GUIDED EXCISION AND AXILLARY SENTINEL LYMPH NODE BIOPSY Left 02/23/2017   Procedure: LEFT TOTAL MATECTOMY;  LEFT AXILLARY SENTINEL LYMPH NODE BIOPSY ERAS PATHWAY;  Surgeon: Excell Seltzer, MD;  Location: [place];  Service: General;  Laterality: Left;  . PILONIDAL CYST EXCISION    . PORTA CATH INSERTION  02/23/2017  . PORTACATH PLACEMENT N/A 02/23/2017   Procedure: INSERTION PORT-A-CATH;  Surgeon: Excell Seltzer,  MD;  Location: [place];  Service: General;  Laterality: N/A;  . SHOULDER ARTHROSCOPY W/ ROTATOR CUFF REPAIR Right     There were no vitals filed for this visit.  Subjective Assessment - 08/10/17 1124    Subjective  I still can't tell a difference with the gabapentin, my feet are killing me. I can up my dose again this Friday. They said it would take awhile to kick in. My Lt shoulder is doing pretty fair and I'm able to get into position for radiation pretty good. Starting to have some color change but no tenderness yet from radiation.     Pertinent History  Patient underwent a left mastectomy and sentinel node biopsy (6 negative nodes) on 02/23/17. She will begin chemotherapy 03/31/17 as long as drain is removed today. She has 2 different breast cancers. Both are ER/PR positive but one is HER2 negative and one is HER2 positive. History of right shoulder scope 20 years ago and has diabetes and hypertension, both of which are well controlled.    Patient Stated Goals  See if my arm is ok    Currently in Pain?  Yes    Pain Score  9     Pain Location  Foot    Pain Orientation  Right;Left    Pain Descriptors / Indicators  Burning;Sharp sharp pain is intermitten  Pain Frequency  Constant    Aggravating Factors   just always hurts    Pain Relieving Factors  nothing yet buthoping the gabapentin will start helping                       [facility] Adult PT Treatment/Exercise - 08/10/17 0001      Manual Therapy   Soft tissue mobilization  to incision area at left chest in all directions tender inferior to incision    Myofascial Release  across left axilla area in various directions and with various hand holds    Manual Lymphatic Drainage (MLD)  Briefly to Lt lateral trunk: Short neck, 5 diaphragmatic breaths, Lt inguinal nodes, and Lt axillo-inguinal anastomosis focusing at area of swelling inferior and lateral to incision    Passive ROM  in supine to left shoulder into er, abduction  (prolonged stretch), and flexion, an dD2 all to tpst tolerance                  PT Long Term Goals - 08/03/17 1735      PT LONG TERM GOAL #1   Title  Patient will be independent in her home exercise program for shoulder ROM.    Status  Achieved      PT LONG TERM GOAL #2   Title  Patient will demonstrate shoulder flexion AROM to be >/= 140 degrees for increased ease reaching overhead.    Status  Achieved      PT LONG TERM GOAL #3   Title  Patient will demonstrate shoulder abduction AROM to be >/= 140 degrees for increased ease reaching overhead.    Status  Achieved      PT LONG TERM GOAL #4   Title  Patient will score </= 10 on the DASH indicating increased shoulder function.    Status  Achieved      PT LONG TERM GOAL #5   Title  Patient will verbalize she understands lymphedema risk reduction.    Status  Achieved      Additional Long Term Goals   Additional Long Term Goals  Yes      PT LONG TERM GOAL #6   Title  Left shoulder abduction to at least 160 degrees    Baseline  146 on 06/18/17; 156 degrees-07/13/17    Status  On-going      PT LONG TERM GOAL #7   Title  Pt. will be independent with home exercise for balance.    Baseline  On 08/03/17, she is unable to do these exercises due to foot pain from worsening neuropathy.    Status  Deferred      PT LONG TERM GOAL #8   Title  Pt. will report at least 25% decrease in foot pain with ability to do self-management for this    Baseline  9/10 on 08/03/17    Time  [facility] Goals - 02/08/17 2024      Patient will be able to verbalize understanding of pertinent lymphedema risk reduction practices relevant to her diagnosis specifically related to skin care.   Status  Achieved      Patient will be able to return demonstrate and/or verbalize understanding of the post-op home exercise program related to regaining shoulder range of motion.   Status  Achieved      Patient will  be able to verbalize  understanding of the importance of attending the postoperative After Breast Cancer Class for further lymphedema risk reduction education and therapeutic exercise.   Status  Achieved           Plan - 08/10/17 1245    Clinical Impression Statement  Pt continues with difficulty with her neuropathy reporting her doctor said it would take awhile for the gabapentin to "kick in". So continued with manual therapy today focusing on limited end ROM due to facsial/tissue tightness. Pt also had noticeable increased swelling at lateral trunk inferior to incision so resumed manual lymph drainage and by end of session her swelling was visibly improved.     Rehab Potential  Excellent    PT Frequency  2x / week    PT Duration  4 weeks    PT Treatment/Interventions  ADLs/Self Care Home Management;Therapeutic exercise;Patient/family education;Orthotic Fit/Training;Manual techniques;Passive range of motion;Manual lymph drainage;Scar mobilization;DME Instruction;Electrical Stimulation;Moist Heat;Contrast Bath    PT Next Visit Plan  Try electrical stimulation or other techniques for her foot neuropathy. Resume balance exercises once her feet are less painful. Continue left shoulder ROM work    Oncologist with Plan of Care  Patient       Patient will benefit from skilled therapeutic intervention in order to improve the following deficits and impairments:  Postural dysfunction, Decreased knowledge of precautions, Decreased knowledge of use of DME, Impaired UE functional use, Decreased range of motion, Decreased strength, Increased fascial restricitons, Decreased scar mobility  Visit Diagnosis: Aftercare following surgery for neoplasm  Stiffness of left shoulder, not elsewhere classified  Abnormal posture     Problem List Patient Active Problem List   Diagnosis Date Noted  . Port-A-Cath in place 04/14/2017  . Stage II breast cancer, left ([place]) 02/23/2017  . Carcinoma of  upper-outer quadrant of left breast in female, estrogen receptor positive ([place]) 02/10/2017  . Carcinoma of upper-inner quadrant of left breast in female, estrogen receptor positive ([place]) 02/10/2017  . Chest pain 07/18/2015  . Hypokalemia 07/18/2015  . Vertigo 07/18/2015  . LBBB (left bundle branch block) 05/28/2009  . AODM 12/21/2007  . Essential hypertension, benign 12/21/2007  . DIASTOLIC DYSFUNCTION 88/32/5498  . FEMORAL BRUIT, RIGHT 12/21/2007  . DYSPNEA 12/21/2007    Otelia Limes, PTA 08/10/2017, 12:49 PM  [place], [place], [address] Phone: [phone]   Fax:  [phone]  Name: Penny Huynh MRN: [id number] Date of Birth: 1933/04/11

## 2017-08-11 ENCOUNTER — Ambulatory Visit
Admission: RE | Admit: 2017-08-11 | Discharge: 2017-08-11 | Disposition: A | Discharge status: Home / Self Care | Payer: Medicare Other | Source: Ambulatory Visit | Attending: Radiation Oncology | Admitting: Radiation Oncology

## 2017-08-11 DIAGNOSIS — Z510 Encounter for antineoplastic radiation therapy: Secondary | ICD-10-CM | POA: Diagnosis not present

## 2017-08-11 DIAGNOSIS — Z170 Estrogen receptor positive status [ER+]: Secondary | ICD-10-CM | POA: Diagnosis not present

## 2017-08-11 DIAGNOSIS — C50212 Malignant neoplasm of upper-inner quadrant of left female breast: Secondary | ICD-10-CM | POA: Diagnosis not present

## 2017-08-12 ENCOUNTER — Ambulatory Visit
Admission: RE | Admit: 2017-08-12 | Discharge: 2017-08-12 | Disposition: A | Discharge status: Home / Self Care | Payer: Medicare Other | Source: Ambulatory Visit | Attending: Radiation Oncology | Admitting: Radiation Oncology

## 2017-08-12 DIAGNOSIS — C50212 Malignant neoplasm of upper-inner quadrant of left female breast: Secondary | ICD-10-CM | POA: Diagnosis not present

## 2017-08-12 DIAGNOSIS — Z510 Encounter for antineoplastic radiation therapy: Secondary | ICD-10-CM | POA: Diagnosis not present

## 2017-08-12 DIAGNOSIS — Z170 Estrogen receptor positive status [ER+]: Secondary | ICD-10-CM | POA: Diagnosis not present

## 2017-08-13 ENCOUNTER — Ambulatory Visit
Admission: RE | Admit: 2017-08-13 | Discharge: 2017-08-13 | Disposition: A | Discharge status: Home / Self Care | Payer: Medicare Other | Source: Ambulatory Visit | Attending: Radiation Oncology | Admitting: Radiation Oncology

## 2017-08-13 ENCOUNTER — Ambulatory Visit: Payer: Medicare Other | Admitting: Physical Therapy

## 2017-08-13 ENCOUNTER — Encounter: Payer: Self-pay | Admitting: Physical Therapy

## 2017-08-13 DIAGNOSIS — Z510 Encounter for antineoplastic radiation therapy: Secondary | ICD-10-CM | POA: Diagnosis not present

## 2017-08-13 DIAGNOSIS — M25612 Stiffness of left shoulder, not elsewhere classified: Secondary | ICD-10-CM

## 2017-08-13 DIAGNOSIS — R2681 Unsteadiness on feet: Secondary | ICD-10-CM

## 2017-08-13 DIAGNOSIS — C50212 Malignant neoplasm of upper-inner quadrant of left female breast: Secondary | ICD-10-CM | POA: Diagnosis not present

## 2017-08-13 DIAGNOSIS — Z483 Aftercare following surgery for neoplasm: Secondary | ICD-10-CM

## 2017-08-13 DIAGNOSIS — R209 Unspecified disturbances of skin sensation: Secondary | ICD-10-CM | POA: Diagnosis not present

## 2017-08-13 DIAGNOSIS — C50912 Malignant neoplasm of unspecified site of left female breast: Secondary | ICD-10-CM | POA: Diagnosis not present

## 2017-08-13 DIAGNOSIS — R293 Abnormal posture: Secondary | ICD-10-CM | POA: Diagnosis not present

## 2017-08-13 DIAGNOSIS — Z170 Estrogen receptor positive status [ER+]: Secondary | ICD-10-CM | POA: Diagnosis not present

## 2017-08-13 NOTE — Patient Instructions (Signed)
  PNF Strengthening: Resisted   Standing with resistive band around each hand, bring right arm up and away, thumb back. Repeat _10___ times per set. Do _2___ sets per session. Do _1-2___ sessions per day.      Resisted Horizontal Abduction: Bilateral   Sit or stand, tubing in both hands, arms out in front. Keeping arms straight, pinch shoulder blades together and stretch arms out. Repeat _10___ times per set. Do 2____ sets per session. Do _1-2___ sessions per day.    Shoulder Retraction / External Rotation With Band    With band held in front, keep elbows at side while rotating hands apart and pulling shoulder blades back. Hold _2___ seconds. Repeat __10__ times. Do _1-2___ sessions per day.  Copyright  VHI. All rights reserved.

## 2017-08-13 NOTE — Therapy (Signed)
[place], [place], [address] Phone: [phone]   Fax:  [phone]  Physical Therapy Treatment  Patient Details  Name: Penny Huynh MRN: [id number] Date of Birth: 1933/11/21 Referring Provider: [name]   Encounter Date: 08/13/2017  PT End of Session - 08/13/17 0802    Visit Number  33    Number of Visits  38    Date for PT Re-Evaluation  09/10/17    PT Start Time  0802    PT Stop Time  0840    PT Time Calculation (min)  38 min    Activity Tolerance  Patient tolerated treatment well    Behavior During Therapy  [facility] for tasks assessed/performed       Past Medical History:  Diagnosis Date  . Arthritis    "maybe a little in my left knee" (02/23/2017)  . Asthmatic bronchitis   . Cancer of left breast ([place])   . Coronary atherosclerosis    Minor nonobstructive CAD at cardiac catheterization 2009  . Dyslipidemia   . Femoral bruit 7/08   With normal ABIs  . Hypertension   . Lower extremity edema    Chronic  . Pneumonia ~ 1950   in 8th grade  . Type II diabetes mellitus ([place])     Past Surgical History:  Procedure Laterality Date  . APPENDECTOMY    . BREAST BIOPSY Left 02/2017  . CARDIAC CATHETERIZATION     "long time ago" (02/23/2017)  . MASTECTOMY COMPLETE / SIMPLE W/ SENTINEL NODE BIOPSY Left 02/23/2017   TOTAL MATECTOMY;  LEFT AXILLARY SENTINEL LYMPH NODE BIOPSY ERAS PATHWAY  . MASTECTOMY WITH RADIOACTIVE SEED GUIDED EXCISION AND AXILLARY SENTINEL LYMPH NODE BIOPSY Left 02/23/2017   Procedure: LEFT TOTAL MATECTOMY;  LEFT AXILLARY SENTINEL LYMPH NODE BIOPSY ERAS PATHWAY;  Surgeon: Excell Seltzer, MD;  Location: [place];  Service: General;  Laterality: Left;  . PILONIDAL CYST EXCISION    . PORTA CATH INSERTION  02/23/2017  . PORTACATH PLACEMENT N/A 02/23/2017   Procedure: INSERTION PORT-A-CATH;  Surgeon: Excell Seltzer, MD;  Location: [place];  Service: General;   Laterality: N/A;  . SHOULDER ARTHROSCOPY W/ ROTATOR CUFF REPAIR Right     There were no vitals filed for this visit.  Subjective Assessment - 08/13/17 0804    Subjective  Feet still hurting. A pain in her LT breast area, same as she has been having. Plans to speak to MD about it.    Pertinent History  Patient underwent a left mastectomy and sentinel node biopsy (6 negative nodes) on 02/23/17. She will begin chemotherapy 03/31/17 as long as drain is removed today. She has 2 different breast cancers. Both are ER/PR positive but one is HER2 negative and one is HER2 positive. History of right shoulder scope 20 years ago and has diabetes and hypertension, both of which are well controlled.    Currently in Pain?  Yes Continues to have neuopathy in bil feet    Pain Score  5     Pain Location  Breast    Aggravating Factors   Constant feet    Pain Relieving Factors  Nothing         [facility] PT Assessment - 08/13/17 0001      AROM   Left Shoulder Flexion  144 Degrees    Left Shoulder ABduction  155 Degrees                   [facility] Adult  PT Treatment/Exercise - 08/13/17 0001      Shoulder Exercises: Seated   Horizontal ABduction  Strengthening;Both;10 reps;Theraband    Theraband Level (Shoulder Horizontal ABduction)  Level 2 (Red)    External Rotation  Strengthening;Left;10 reps;Theraband    Theraband Level (Shoulder External Rotation)  Level 2 (Red)    Diagonals  Strengthening;Right;Left;10 reps;Theraband    Theraband Level (Shoulder Diagonals)  Level 2 (Red)      Shoulder Exercises: Standing   Flexion  -- Finger ladder flex/abd added 1# 10X each      Manual Therapy   Soft tissue mobilization  to incision area at left chest in all directions tender inferior to incision    Passive ROM  in supine to left shoulder into er, abduction (prolonged stretch), and flexion, an dD2 all to tpst tolerance             PT Education - 08/13/17 0830    Education provided  Yes     Education Details  red band scap exercises    Person(s) Educated  Patient    Methods  Explanation;Demonstration;Tactile cues;Verbal cues;Handout    Comprehension  Returned demonstration;Verbalized understanding          PT Long Term Goals - 08/13/17 0816      PT LONG TERM GOAL #1   Title  +      Breast Clinic Goals - 02/08/17 2024      Patient will be able to verbalize understanding of pertinent lymphedema risk reduction practices relevant to her diagnosis specifically related to skin care.   Status  Achieved      Patient will be able to return demonstrate and/or verbalize understanding of the post-op home exercise program related to regaining shoulder range of motion.   Status  Achieved      Patient will be able to verbalize understanding of the importance of attending the postoperative After Breast Cancer Class for further lymphedema risk reduction education and therapeutic exercise.   Status  Achieved           Plan - 08/13/17 0802    Clinical Impression Statement  Held balance exercises today due to foot pain. PROM excellent, continued to work on soft tissue mobility in her LT chest and axilla area. Added some light wrist weights for Lt shoulder and scapular strength today. Pt tolerated all exercises without pain. Some verbal cuing needed to relax the Lt upper trap with scapular exercises.     Rehab Potential  Excellent    PT Frequency  2x / week    PT Duration  4 weeks    PT Treatment/Interventions  ADLs/Self Care Home Management;Therapeutic exercise;Patient/family education;Orthotic Fit/Training;Manual techniques;Passive range of motion;Manual lymph drainage;Scar mobilization;DME Instruction;Electrical Stimulation;Moist Heat;Contrast Bath    PT Next Visit Plan  Try electrical stimulation or other techniques for her foot neuropathy. Resume balance exercises once her feet are less painful. Continue left shoulder ROM work    PT Home Exercise Plan  focus on stretches to try  to release cording; supine scapular series  Pt. returned to her previous exercise program at her church as of 05/17/17; horizontal abduction stretch in supine; standing leg raises, other balance exercises    Consulted and Agree with Plan of Care  Patient       Patient will benefit from skilled therapeutic intervention in order to improve the following deficits and impairments:  Postural dysfunction, Decreased knowledge of precautions, Decreased knowledge of use of DME, Impaired UE functional use, Decreased range of motion, Decreased  strength, Increased fascial restricitons, Decreased scar mobility  Visit Diagnosis: Aftercare following surgery for neoplasm  Stiffness of left shoulder, not elsewhere classified  Abnormal posture  Unsteadiness on feet  Unspecified disturbances of skin sensation  Malignant neoplasm of left breast in female, estrogen receptor positive, unspecified site of breast [facility])     Problem List Patient Active Problem List   Diagnosis Date Noted  . Port-A-Cath in place 04/14/2017  . Stage II breast cancer, left ([place]) 02/23/2017  . Carcinoma of upper-outer quadrant of left breast in female, estrogen receptor positive ([place]) 02/10/2017  . Carcinoma of upper-inner quadrant of left breast in female, estrogen receptor positive ([place]) 02/10/2017  . Chest pain 07/18/2015  . Hypokalemia 07/18/2015  . Vertigo 07/18/2015  . LBBB (left bundle branch block) 05/28/2009  . AODM 12/21/2007  . Essential hypertension, benign 12/21/2007  . DIASTOLIC DYSFUNCTION 21/51/5826  . FEMORAL BRUIT, RIGHT 12/21/2007  . DYSPNEA 12/21/2007    COCHRAN,JENNIFER, PTA 08/13/2017, 8:39 AM  [place], [place], [address] Phone: [phone]   Fax:  [phone]  Name: Penny Huynh MRN: [id number] Date of Birth: November 08, 1933

## 2017-08-16 ENCOUNTER — Ambulatory Visit
Admission: RE | Admit: 2017-08-16 | Discharge: 2017-08-16 | Disposition: A | Discharge status: Home / Self Care | Payer: Medicare Other | Source: Ambulatory Visit | Attending: Radiation Oncology | Admitting: Radiation Oncology

## 2017-08-16 DIAGNOSIS — Z170 Estrogen receptor positive status [ER+]: Secondary | ICD-10-CM | POA: Diagnosis not present

## 2017-08-16 DIAGNOSIS — C50212 Malignant neoplasm of upper-inner quadrant of left female breast: Secondary | ICD-10-CM | POA: Diagnosis not present

## 2017-08-16 DIAGNOSIS — Z510 Encounter for antineoplastic radiation therapy: Secondary | ICD-10-CM | POA: Diagnosis not present

## 2017-08-16 DIAGNOSIS — C50412 Malignant neoplasm of upper-outer quadrant of left female breast: Secondary | ICD-10-CM

## 2017-08-16 MED ORDER — RADIAPLEXRX EX GEL
Freq: Once | CUTANEOUS | Status: AC
  Administered 2017-08-16: 15:00:00 via TOPICAL

## 2017-08-17 ENCOUNTER — Ambulatory Visit: Payer: Medicare Other

## 2017-08-17 ENCOUNTER — Ambulatory Visit
Admission: RE | Admit: 2017-08-17 | Discharge: 2017-08-17 | Disposition: A | Discharge status: Home / Self Care | Payer: Medicare Other | Source: Ambulatory Visit | Attending: Radiation Oncology | Admitting: Radiation Oncology

## 2017-08-17 DIAGNOSIS — C50912 Malignant neoplasm of unspecified site of left female breast: Secondary | ICD-10-CM | POA: Diagnosis not present

## 2017-08-17 DIAGNOSIS — M25612 Stiffness of left shoulder, not elsewhere classified: Secondary | ICD-10-CM | POA: Diagnosis not present

## 2017-08-17 DIAGNOSIS — R293 Abnormal posture: Secondary | ICD-10-CM | POA: Diagnosis not present

## 2017-08-17 DIAGNOSIS — R209 Unspecified disturbances of skin sensation: Secondary | ICD-10-CM

## 2017-08-17 DIAGNOSIS — Z510 Encounter for antineoplastic radiation therapy: Secondary | ICD-10-CM | POA: Diagnosis not present

## 2017-08-17 DIAGNOSIS — Z483 Aftercare following surgery for neoplasm: Secondary | ICD-10-CM

## 2017-08-17 DIAGNOSIS — R2681 Unsteadiness on feet: Secondary | ICD-10-CM

## 2017-08-17 DIAGNOSIS — C50212 Malignant neoplasm of upper-inner quadrant of left female breast: Secondary | ICD-10-CM | POA: Diagnosis not present

## 2017-08-17 DIAGNOSIS — Z170 Estrogen receptor positive status [ER+]: Secondary | ICD-10-CM

## 2017-08-17 NOTE — Therapy (Signed)
[place], [place], [address] Phone: [phone]   Fax:  [phone]  Physical Therapy Treatment  Patient Details  Name: Penny Huynh MRN: [id number] Date of Birth: September 21, 1933 Referring Provider: [name]   Encounter Date: 08/17/2017  PT End of Session - 08/17/17 1502    Visit Number  [place]    Number of Visits  38    Date for PT Re-Evaluation  09/10/17    PT Start Time  1433    PT Stop Time  1519    PT Time Calculation (min)  46 min    Activity Tolerance  Patient tolerated treatment well    Behavior During Therapy  [facility] for tasks assessed/performed       Past Medical History:  Diagnosis Date  . Arthritis    "maybe a little in my left knee" (02/23/2017)  . Asthmatic bronchitis   . Cancer of left breast ([place])   . Coronary atherosclerosis    Minor nonobstructive CAD at cardiac catheterization 2009  . Dyslipidemia   . Femoral bruit 7/08   With normal ABIs  . Hypertension   . Lower extremity edema    Chronic  . Pneumonia ~ 1950   in 8th grade  . Type II diabetes mellitus ([place])     Past Surgical History:  Procedure Laterality Date  . APPENDECTOMY    . BREAST BIOPSY Left 02/2017  . CARDIAC CATHETERIZATION     "long time ago" (02/23/2017)  . MASTECTOMY COMPLETE / SIMPLE W/ SENTINEL NODE BIOPSY Left 02/23/2017   TOTAL MATECTOMY;  LEFT AXILLARY SENTINEL LYMPH NODE BIOPSY ERAS PATHWAY  . MASTECTOMY WITH RADIOACTIVE SEED GUIDED EXCISION AND AXILLARY SENTINEL LYMPH NODE BIOPSY Left 02/23/2017   Procedure: LEFT TOTAL MATECTOMY;  LEFT AXILLARY SENTINEL LYMPH NODE BIOPSY ERAS PATHWAY;  Surgeon: Excell Seltzer, MD;  Location: [place];  Service: General;  Laterality: Left;  . PILONIDAL CYST EXCISION    . PORTA CATH INSERTION  02/23/2017  . PORTACATH PLACEMENT N/A 02/23/2017   Procedure: INSERTION PORT-A-CATH;  Surgeon: Excell Seltzer, MD;  Location: [place];  Service: General;   Laterality: N/A;  . SHOULDER ARTHROSCOPY W/ ROTATOR CUFF REPAIR Right     There were no vitals filed for this visit.  Subjective Assessment - 08/17/17 1445    Subjective  My feet are still hurting except now I am starting to feel it running up the back of my legs too. The exercises she gave me last week are going well though. My Lt shoulder is steadily improving,     Pertinent History  Patient underwent a left mastectomy and sentinel node biopsy (6 negative nodes) on 02/23/17. She will begin chemotherapy 03/31/17 as long as drain is removed today. She has 2 different breast cancers. Both are ER/PR positive but one is HER2 negative and one is HER2 positive. History of right shoulder scope 20 years ago and has diabetes and hypertension, both of which are well controlled.    Patient Stated Goals  See if my arm is ok    Currently in Pain?  Yes    Pain Score  9     Pain Location  Foot    Pain Orientation  Right;Left    Pain Descriptors / Indicators  Burning;Sharp sharp pain is intermittent    Pain Type  Neuropathic pain    Pain Onset  More than a month ago    Aggravating Factors   constant in feet and  seems to be getting worse    Pain Relieving Factors  nothing                       [facility] Adult PT Treatment/Exercise - 08/17/17 0001      Shoulder Exercises: Supine   Horizontal ABduction  Strengthening;Both;10 reps;Theraband    Theraband Level (Shoulder Horizontal ABduction)  Level 3 (Green)    External Rotation  Strengthening;Both;10 reps;Theraband    Theraband Level (Shoulder External Rotation)  Level 3 (Green)    Flexion  Strengthening;Both;5 reps;Theraband Narrow and Wide Grip, 5 times each    Theraband Level (Shoulder Flexion)  Level 3 (Green)    Diagonals  Strengthening;Left;10 reps;Theraband    Theraband Level (Shoulder Diagonals)  Level 3 (Green)      Modalities   Modalities  Librarian, academic Action  IFC    Electrical Stimulation Parameters  80-150 Hz x5 mins first with 1 elctrode on plantar surface of feet and lateral ankles but no sensation in feet so switched to x 15 mins (Rt output 24, Lt 33) elctrodes at lateral ankles and mid gastroc    Electrical Stimulation Goals  Other (comment) CIPN      Manual Therapy   Myofascial Release  To anterior elbow where pt c/o tightness, and feeling cording    Passive ROM  in supine to left shoulder into er, abduction (prolonged stretch), and flexion, and D2 all to pts tolerance                  PT Long Term Goals - 08/13/17 0816      PT LONG TERM GOAL #1   Title  +      Breast Clinic Goals - 02/08/17 2024      Patient will be able to verbalize understanding of pertinent lymphedema risk reduction practices relevant to her diagnosis specifically related to skin care.   Status  Achieved      Patient will be able to return demonstrate and/or verbalize understanding of the post-op home exercise program related to regaining shoulder range of motion.   Status  Achieved      Patient will be able to verbalize understanding of the importance of attending the postoperative After Breast Cancer Class for further lymphedema risk reduction education and therapeutic exercise.   Status  Achieved           Plan - 08/17/17 1502    Clinical Impression Statement  Proressed pt today to include green theraband with supine scapular series. Also began eletrical stimulation to bil LE's (5 mins on feet but no sensation, then 15 mins on lower legs during exercises and manual therapy). Pt reports tolerating this well and will let us know if she had any change in her symptoms.      Rehab Potential  Excellent    PT Frequency  2x / week    PT Duration  4 weeks    PT Treatment/Interventions  ADLs/Self Care Home Management;Therapeutic exercise;Patient/family education;Orthotic Fit/Training;Manual techniques;Passive  range of motion;Manual lymph drainage;Scar mobilization;DME Instruction;Electrical Stimulation;Moist Heat;Contrast Bath    PT Next Visit Plan  Assess electrical stimulation or try other techniques for her foot neuropathy. Resume balance exercises once her feet are less painful. Continue left shoulder ROM work    Oncologist with Plan of Care  Patient       Patient  will benefit from skilled therapeutic intervention in order to improve the following deficits and impairments:  Postural dysfunction, Decreased knowledge of precautions, Decreased knowledge of use of DME, Impaired UE functional use, Decreased range of motion, Decreased strength, Increased fascial restricitons, Decreased scar mobility  Visit Diagnosis: Aftercare following surgery for neoplasm  Stiffness of left shoulder, not elsewhere classified  Abnormal posture  Unsteadiness on feet  Unspecified disturbances of skin sensation  Malignant neoplasm of left breast in female, estrogen receptor positive, unspecified site of breast [facility])     Problem List Patient Active Problem List   Diagnosis Date Noted  . Port-A-Cath in place 04/14/2017  . Stage II breast cancer, left ([place]) 02/23/2017  . Carcinoma of upper-outer quadrant of left breast in female, estrogen receptor positive ([place]) 02/10/2017  . Carcinoma of upper-inner quadrant of left breast in female, estrogen receptor positive ([place]) 02/10/2017  . Chest pain 07/18/2015  . Hypokalemia 07/18/2015  . Vertigo 07/18/2015  . LBBB (left bundle branch block) 05/28/2009  . AODM 12/21/2007  . Essential hypertension, benign 12/21/2007  . DIASTOLIC DYSFUNCTION 55/02/5866  . FEMORAL BRUIT, RIGHT 12/21/2007  . DYSPNEA 12/21/2007    Otelia Limes, PTA 08/17/2017, 3:21 PM  [place], [place], [address] Phone: [phone]   Fax:  [phone]  Name: NAIJA TROOST MRN:  [id number] Date of Birth: 1933-08-18

## 2017-08-18 ENCOUNTER — Ambulatory Visit
Admission: RE | Admit: 2017-08-18 | Discharge: 2017-08-18 | Disposition: A | Discharge status: Home / Self Care | Payer: Medicare Other | Source: Ambulatory Visit | Attending: Radiation Oncology | Admitting: Radiation Oncology

## 2017-08-18 ENCOUNTER — Telehealth: Payer: Self-pay

## 2017-08-18 DIAGNOSIS — Z170 Estrogen receptor positive status [ER+]: Secondary | ICD-10-CM | POA: Diagnosis not present

## 2017-08-18 DIAGNOSIS — C50212 Malignant neoplasm of upper-inner quadrant of left female breast: Secondary | ICD-10-CM | POA: Diagnosis not present

## 2017-08-18 DIAGNOSIS — Z510 Encounter for antineoplastic radiation therapy: Secondary | ICD-10-CM | POA: Diagnosis not present

## 2017-08-18 NOTE — Telephone Encounter (Signed)
Patient left voice message for return RN call, called back no answer, left voice message for patient to call back with details about what is going on.  She mentioned something about her medication and neuropathy.

## 2017-08-19 ENCOUNTER — Ambulatory Visit
Admission: RE | Admit: 2017-08-19 | Discharge: 2017-08-19 | Disposition: A | Discharge status: Home / Self Care | Payer: Medicare Other | Source: Ambulatory Visit | Attending: Radiation Oncology | Admitting: Radiation Oncology

## 2017-08-19 DIAGNOSIS — C50212 Malignant neoplasm of upper-inner quadrant of left female breast: Secondary | ICD-10-CM | POA: Diagnosis not present

## 2017-08-19 DIAGNOSIS — Z170 Estrogen receptor positive status [ER+]: Secondary | ICD-10-CM | POA: Diagnosis not present

## 2017-08-19 DIAGNOSIS — Z510 Encounter for antineoplastic radiation therapy: Secondary | ICD-10-CM | POA: Diagnosis not present

## 2017-08-20 ENCOUNTER — Ambulatory Visit: Payer: Medicare Other | Admitting: Physical Therapy

## 2017-08-20 ENCOUNTER — Encounter: Payer: Self-pay | Admitting: Physical Therapy

## 2017-08-20 ENCOUNTER — Ambulatory Visit
Admission: RE | Admit: 2017-08-20 | Discharge: 2017-08-20 | Disposition: A | Discharge status: Home / Self Care | Payer: Medicare Other | Source: Ambulatory Visit | Attending: Radiation Oncology | Admitting: Radiation Oncology

## 2017-08-20 DIAGNOSIS — R2681 Unsteadiness on feet: Secondary | ICD-10-CM

## 2017-08-20 DIAGNOSIS — Z170 Estrogen receptor positive status [ER+]: Secondary | ICD-10-CM

## 2017-08-20 DIAGNOSIS — Z510 Encounter for antineoplastic radiation therapy: Secondary | ICD-10-CM | POA: Diagnosis not present

## 2017-08-20 DIAGNOSIS — Z483 Aftercare following surgery for neoplasm: Secondary | ICD-10-CM

## 2017-08-20 DIAGNOSIS — R209 Unspecified disturbances of skin sensation: Secondary | ICD-10-CM | POA: Diagnosis not present

## 2017-08-20 DIAGNOSIS — C50912 Malignant neoplasm of unspecified site of left female breast: Secondary | ICD-10-CM | POA: Diagnosis not present

## 2017-08-20 DIAGNOSIS — M25612 Stiffness of left shoulder, not elsewhere classified: Secondary | ICD-10-CM | POA: Diagnosis not present

## 2017-08-20 DIAGNOSIS — R293 Abnormal posture: Secondary | ICD-10-CM | POA: Diagnosis not present

## 2017-08-20 DIAGNOSIS — C50212 Malignant neoplasm of upper-inner quadrant of left female breast: Secondary | ICD-10-CM | POA: Diagnosis not present

## 2017-08-20 NOTE — Therapy (Signed)
[place], [place], [address] Phone: [phone]   Fax:  [phone]  Physical Therapy Treatment  Patient Details  Name: Penny Huynh MRN: [id number] Date of Birth: Feb 19, 1933 Referring Provider: [name]   Encounter Date: 08/20/2017  PT End of Session - 08/20/17 0854    Visit Number  36    Number of Visits  38    Date for PT Re-Evaluation  09/10/17    PT Start Time  1324 Pt late    PT Stop Time  0925    PT Time Calculation (min)  31 min    Activity Tolerance  Patient tolerated treatment well    Behavior During Therapy  [facility] for tasks assessed/performed       Past Medical History:  Diagnosis Date  . Arthritis    "maybe a little in my left knee" (02/23/2017)  . Asthmatic bronchitis   . Cancer of left breast ([place])   . Coronary atherosclerosis    Minor nonobstructive CAD at cardiac catheterization 2009  . Dyslipidemia   . Femoral bruit 7/08   With normal ABIs  . Hypertension   . Lower extremity edema    Chronic  . Pneumonia ~ 1950   in 8th grade  . Type II diabetes mellitus ([place])     Past Surgical History:  Procedure Laterality Date  . APPENDECTOMY    . BREAST BIOPSY Left 02/2017  . CARDIAC CATHETERIZATION     "long time ago" (02/23/2017)  . MASTECTOMY COMPLETE / SIMPLE W/ SENTINEL NODE BIOPSY Left 02/23/2017   TOTAL MATECTOMY;  LEFT AXILLARY SENTINEL LYMPH NODE BIOPSY ERAS PATHWAY  . MASTECTOMY WITH RADIOACTIVE SEED GUIDED EXCISION AND AXILLARY SENTINEL LYMPH NODE BIOPSY Left 02/23/2017   Procedure: LEFT TOTAL MATECTOMY;  LEFT AXILLARY SENTINEL LYMPH NODE BIOPSY ERAS PATHWAY;  Surgeon: Excell Seltzer, MD;  Location: [place];  Service: General;  Laterality: Left;  . PILONIDAL CYST EXCISION    . PORTA CATH INSERTION  02/23/2017  . PORTACATH PLACEMENT N/A 02/23/2017   Procedure: INSERTION PORT-A-CATH;  Surgeon: Excell Seltzer, MD;  Location: [place];  Service: General;   Laterality: N/A;  . SHOULDER ARTHROSCOPY W/ ROTATOR CUFF REPAIR Right     There were no vitals filed for this visit.  Subjective Assessment - 08/20/17 0900    Subjective  Feet and fingers are hurting and numb. She isplaying phine tag with MD. No shoulder pain reported. She is unsure if the estim was effective.    Pertinent History  Patient underwent a left mastectomy and sentinel node biopsy (6 negative nodes) on 02/23/17. She will begin chemotherapy 03/31/17 as long as drain is removed today. She has 2 different breast cancers. Both are ER/PR positive but one is HER2 negative and one is HER2 positive. History of right shoulder scope 20 years ago and has diabetes and hypertension, both of which are well controlled.    Currently in Pain?  Yes Feet and fingers are numb and tingling.     Pain Score  9     Pain Orientation  Right;Left    Pain Descriptors / Indicators  Burning         [facility] PT Assessment - 08/20/17 0001      AROM   Left Shoulder Flexion  148 Degrees    Left Shoulder ABduction  160 Degrees                   [facility] Adult PT Treatment/Exercise -  08/20/17 0001      Shoulder Exercises: Supine   Horizontal ABduction  Strengthening;Both;20 reps;Theraband Vc to ground scapula    Theraband Level (Shoulder Horizontal ABduction)  Level 3 (Green)    External Rotation  Strengthening;Both;20 reps;Theraband    Theraband Level (Shoulder External Rotation)  Level 3 (Green)    Flexion  Strengthening;Both;20 reps;Theraband    Diagonals  Strengthening;Left;20 reps;Theraband    Theraband Level (Shoulder Diagonals)  Level 3 (Green)                  PT Long Term Goals - 08/20/17 0911      PT LONG TERM GOAL #6   Title  Left shoulder abduction to at least 160 degrees    Baseline  146 on 06/18/17; 156 degrees-07/13/17    Time  2    Period  Weeks    Status  Achieved 160 active       PT LONG TERM GOAL #7   Title  Pt. will be independent with home exercise for balance.     Baseline  On 08/03/17, she is unable to do these exercises due to foot pain from worsening neuropathy.    Status  Deferred      PT LONG TERM GOAL #8   Title  Pt. will report at least 25% decrease in foot pain with ability to do self-management for this    Baseline  9/10 on 08/03/17    Time  4    Period  Weeks    Status  On-going      Breast Clinic Goals - 02/08/17 2024      Patient will be able to verbalize understanding of pertinent lymphedema risk reduction practices relevant to her diagnosis specifically related to skin care.   Status  Achieved      Patient will be able to return demonstrate and/or verbalize understanding of the post-op home exercise program related to regaining shoulder range of motion.   Status  Achieved      Patient will be able to verbalize understanding of the importance of attending the postoperative After Breast Cancer Class for further lymphedema risk reduction education and therapeutic exercise.   Status  Achieved           Plan - 08/20/17 0855    Clinical Impression Statement  Pt continues to report bilateral feet and now finger tingling, numbness and pain. She is currently playing phone tag with the MD to discuss. Pt was able to tolerate adding another set of her scapular stabiilization exercises demonstrating improved muscular endurance. She needed both verbal and tactile cuing to avoid scapular elevation. Pt's AROM improved and has met all goals for ROM. Pain in her feet limit her ability to exercise in standing.     Rehab Potential  Excellent    PT Frequency  2x / week    PT Duration  4 weeks    PT Treatment/Interventions  ADLs/Self Care Home Management;Therapeutic exercise;Patient/family education;Orthotic Fit/Training;Manual techniques;Passive range of motion;Manual lymph drainage;Scar mobilization;DME Instruction;Electrical Stimulation;Moist Heat;Contrast Bath    PT Next Visit Plan  Lt shoulder and scapular strength ( RTC). See if pt got in touch  with MD regarding her neuropathy.    Consulted and Agree with Plan of Care  Patient       Patient will benefit from skilled therapeutic intervention in order to improve the following deficits and impairments:  Postural dysfunction, Decreased knowledge of precautions, Decreased knowledge of use of DME, Impaired UE functional use, Decreased range of  motion, Decreased strength, Increased fascial restricitons, Decreased scar mobility  Visit Diagnosis: Aftercare following surgery for neoplasm  Stiffness of left shoulder, not elsewhere classified  Abnormal posture  Unsteadiness on feet  Unspecified disturbances of skin sensation  Malignant neoplasm of left breast in female, estrogen receptor positive, unspecified site of breast [facility])     Problem List Patient Active Problem List   Diagnosis Date Noted  . Port-A-Cath in place 04/14/2017  . Stage II breast cancer, left ([place]) 02/23/2017  . Carcinoma of upper-outer quadrant of left breast in female, estrogen receptor positive ([place]) 02/10/2017  . Carcinoma of upper-inner quadrant of left breast in female, estrogen receptor positive ([place]) 02/10/2017  . Chest pain 07/18/2015  . Hypokalemia 07/18/2015  . Vertigo 07/18/2015  . LBBB (left bundle branch block) 05/28/2009  . AODM 12/21/2007  . Essential hypertension, benign 12/21/2007  . DIASTOLIC DYSFUNCTION 67/61/9509  . FEMORAL BRUIT, RIGHT 12/21/2007  . DYSPNEA 12/21/2007    Anjolaoluwa Siguenza, PTA 08/20/2017, 9:28 AM  [place], [place], [address] Phone: [phone]   Fax:  [phone]  Name: Penny Huynh MRN: [id number] Date of Birth: 14-Aug-1933

## 2017-08-23 ENCOUNTER — Ambulatory Visit
Admission: RE | Admit: 2017-08-23 | Discharge: 2017-08-23 | Disposition: A | Discharge status: Home / Self Care | Payer: Medicare Other | Source: Ambulatory Visit | Attending: Radiation Oncology | Admitting: Radiation Oncology

## 2017-08-23 DIAGNOSIS — Z510 Encounter for antineoplastic radiation therapy: Secondary | ICD-10-CM | POA: Diagnosis not present

## 2017-08-23 DIAGNOSIS — C50212 Malignant neoplasm of upper-inner quadrant of left female breast: Secondary | ICD-10-CM | POA: Diagnosis not present

## 2017-08-23 DIAGNOSIS — Z170 Estrogen receptor positive status [ER+]: Secondary | ICD-10-CM | POA: Diagnosis not present

## 2017-08-24 ENCOUNTER — Ambulatory Visit
Admission: RE | Admit: 2017-08-24 | Discharge: 2017-08-24 | Disposition: A | Discharge status: Home / Self Care | Payer: Medicare Other | Source: Ambulatory Visit | Attending: Radiation Oncology | Admitting: Radiation Oncology

## 2017-08-24 ENCOUNTER — Ambulatory Visit: Payer: Medicare Other

## 2017-08-24 DIAGNOSIS — C50212 Malignant neoplasm of upper-inner quadrant of left female breast: Secondary | ICD-10-CM | POA: Diagnosis not present

## 2017-08-24 DIAGNOSIS — R209 Unspecified disturbances of skin sensation: Secondary | ICD-10-CM | POA: Diagnosis not present

## 2017-08-24 DIAGNOSIS — Z483 Aftercare following surgery for neoplasm: Secondary | ICD-10-CM

## 2017-08-24 DIAGNOSIS — Z170 Estrogen receptor positive status [ER+]: Secondary | ICD-10-CM

## 2017-08-24 DIAGNOSIS — C50912 Malignant neoplasm of unspecified site of left female breast: Secondary | ICD-10-CM | POA: Diagnosis not present

## 2017-08-24 DIAGNOSIS — R293 Abnormal posture: Secondary | ICD-10-CM | POA: Diagnosis not present

## 2017-08-24 DIAGNOSIS — R2681 Unsteadiness on feet: Secondary | ICD-10-CM | POA: Diagnosis not present

## 2017-08-24 DIAGNOSIS — M25612 Stiffness of left shoulder, not elsewhere classified: Secondary | ICD-10-CM

## 2017-08-24 DIAGNOSIS — Z510 Encounter for antineoplastic radiation therapy: Secondary | ICD-10-CM | POA: Diagnosis not present

## 2017-08-24 NOTE — Therapy (Addendum)
[place], [place], [address] Phone: [phone]   Fax:  [phone]  Physical Therapy Treatment  Patient Details  Name: Penny Huynh MRN: [id number] Date of Birth: 1933/11/01 Referring Provider: [name]   Encounter Date: 08/24/2017  PT End of Session - 08/24/17 0933    Visit Number  82 Add KX    Number of Visits  38    Date for PT Re-Evaluation  09/10/17    PT Start Time  0850    PT Stop Time  0937    PT Time Calculation (min)  47 min    Activity Tolerance  Patient tolerated treatment well;Treatment limited secondary to medical complications (Comment) Pain from CIPN is limiting pts activities    Behavior During Therapy  [facility] for tasks assessed/performed       Past Medical History:  Diagnosis Date  . Arthritis    "maybe a little in my left knee" (02/23/2017)  . Asthmatic bronchitis   . Cancer of left breast ([place])   . Coronary atherosclerosis    Minor nonobstructive CAD at cardiac catheterization 2009  . Dyslipidemia   . Femoral bruit 7/08   With normal ABIs  . Hypertension   . Lower extremity edema    Chronic  . Pneumonia ~ 1950   in 8th grade  . Type II diabetes mellitus ([place])     Past Surgical History:  Procedure Laterality Date  . APPENDECTOMY    . BREAST BIOPSY Left 02/2017  . CARDIAC CATHETERIZATION     "long time ago" (02/23/2017)  . MASTECTOMY COMPLETE / SIMPLE W/ SENTINEL NODE BIOPSY Left 02/23/2017   TOTAL MATECTOMY;  LEFT AXILLARY SENTINEL LYMPH NODE BIOPSY ERAS PATHWAY  . MASTECTOMY WITH RADIOACTIVE SEED GUIDED EXCISION AND AXILLARY SENTINEL LYMPH NODE BIOPSY Left 02/23/2017   Procedure: LEFT TOTAL MATECTOMY;  LEFT AXILLARY SENTINEL LYMPH NODE BIOPSY ERAS PATHWAY;  Surgeon: Excell Seltzer, MD;  Location: [facility];  Service: General;  Laterality: Left;  . PILONIDAL CYST EXCISION    . PORTA CATH INSERTION  02/23/2017  . PORTACATH PLACEMENT N/A 02/23/2017    Procedure: INSERTION PORT-A-CATH;  Surgeon: Excell Seltzer, MD;  Location: [facility];  Service: General;  Laterality: N/A;  . SHOULDER ARTHROSCOPY W/ ROTATOR CUFF REPAIR Right     There were no vitals filed for this visit.  Subjective Assessment - 08/24/17 0857    Subjective  I have an infusion tomorrow so I plan on talking to them about how the neuropathy has gotten worse moving up the back of my legs and I know they can get [name] for me if needed. I'm really having more trouble walking now due to the pain.     Pertinent History  Patient underwent a left mastectomy and sentinel node biopsy (6 negative nodes) on 02/23/17. She will begin chemotherapy 03/31/17 as long as drain is removed today. She has 2 different breast cancers. Both are ER/PR positive but one is HER2 negative and one is HER2 positive. History of right shoulder scope 20 years ago and has diabetes and hypertension, both of which are well controlled.    Patient Stated Goals  See if my arm is ok    Currently in Pain?  Yes    Pain Score  10-Worst pain ever    Pain Location  Foot    Pain Orientation  Right;Left    Pain Descriptors / Indicators  Burning    Pain Type  Neuropathic pain  Pain Onset  More than a month ago    Pain Frequency  Constant    Aggravating Factors   constant in feet and getting worse, also in hands    Pain Relieving Factors  nothing         [facility] PT Assessment - 08/24/17 0001      AROM   Left Shoulder Flexion  152 Degrees    Left Shoulder ABduction  151 Degrees Pt reports feeling tighter at axilla due to radiation                   [facility] Adult PT Treatment/Exercise - 08/24/17 0001      Knee/Hip Exercises: Aerobic   Stationary Bike  No resistance x5 mins (no shoes, washcloths on pedals)      Electrical Stimulation   Electrical Stimulation Location  Bil knees    Electrical Stimulation Action  IFC    Electrical Stimulation Parameters  80-150Hz  x20 mins (output started at 11, then went  up to 13 on Lt and 12 on Rt)    Electrical Stimulation Goals  Other (comment) CIPN      Manual Therapy   Myofascial Release  To Rt axilla at area of tightness    Passive ROM  in supine to left shoulder into er, abduction (prolonged stretch), and flexion, and D2 all to pts tolerance                  PT Long Term Goals - 08/20/17 0911      PT LONG TERM GOAL #6   Title  Left shoulder abduction to at least 160 degrees    Baseline  146 on 06/18/17; 156 degrees-07/13/17    Time  2    Period  Weeks    Status  Achieved 160 active       PT LONG TERM GOAL #7   Title  Pt. will be independent with home exercise for balance.    Baseline  On 08/03/17, she is unable to do these exercises due to foot pain from worsening neuropathy.    Status  Deferred      PT LONG TERM GOAL #8   Title  Pt. will report at least 25% decrease in foot pain with ability to do self-management for this    Baseline  9/10 on 08/03/17    Time  4    Period  Weeks    Status  On-going        Plan - 08/24/17 1243    Clinical Impression Statement  Pt continues to report worsening of CIPN symptoms in her LE's now up the back of her legs (was just feet) and hands getting worse (trouble with buttoning her shirt this morning and turning on lamp). She gets an infusion tomorrow and plans to talk with [name] (or staff) about worsening of symptoms. Unable to do standing activities with pt due to bil LE pain so tried recumbent bike today which pt actually tolerated well and when performing P/ROM to Lt shoulder had e stim on her bil LE's superior to bil knees today and pt reported could feel this to her feet. Also felt her feet pain might have been slightly better by end of session. Also mentioned to pt if she feels e stim is helping she can order TENS unit online.     Rehab Potential  Excellent    PT Frequency  2x / week    PT Duration  4 weeks    PT  Treatment/Interventions  ADLs/Self Care Home Management;Therapeutic  exercise;Patient/family education;Orthotic Fit/Training;Manual techniques;Passive range of motion;Manual lymph drainage;Scar mobilization;DME Instruction;Electrical Stimulation;Moist Heat;Contrast Bath    PT Next Visit Plan  Try instructing pt in supine LE HEP exercises since she can't tolerate standing strength/balance at this time?? See what MD said regarding her neuropathy. Pt will need a renewal next week, see if she feels PT is helping at this time with neuropathy symptoms or on hold vs D/C?? Also see if she is interested in pursuing a TENS unit.     Consulted and Agree with Plan of Care  Patient       Patient will benefit from skilled therapeutic intervention in order to improve the following deficits and impairments:  Postural dysfunction, Decreased knowledge of precautions, Decreased knowledge of use of DME, Impaired UE functional use, Decreased range of motion, Decreased strength, Increased fascial restricitons, Decreased scar mobility  Visit Diagnosis: Aftercare following surgery for neoplasm  Stiffness of left shoulder, not elsewhere classified  Abnormal posture  Unsteadiness on feet  Unspecified disturbances of skin sensation  Malignant neoplasm of left breast in female, estrogen receptor positive, unspecified site of breast [facility])     Problem List Patient Active Problem List   Diagnosis Date Noted  . Port-A-Cath in place 04/14/2017  . Stage II breast cancer, left ([place]) 02/23/2017  . Carcinoma of upper-outer quadrant of left breast in female, estrogen receptor positive ([place]) 02/10/2017  . Carcinoma of upper-inner quadrant of left breast in female, estrogen receptor positive ([place]) 02/10/2017  . Chest pain 07/18/2015  . Hypokalemia 07/18/2015  . Vertigo 07/18/2015  . LBBB (left bundle branch block) 05/28/2009  . AODM 12/21/2007  . Essential hypertension, benign 12/21/2007  . DIASTOLIC DYSFUNCTION 55/03/7140  . FEMORAL BRUIT, RIGHT 12/21/2007  . DYSPNEA 12/21/2007     Golda Acre 08/24/2017, 1:11 PM  [place], [place], [address] Phone: [phone]   Fax:  [phone]  Name: Penny Huynh MRN: [id number] Date of Birth: 1933/04/14

## 2017-08-24 NOTE — Progress Notes (Signed)
[name]  [phone] [phone]  Clinic Follow up Note   Patient Care Team: Sasser, Silvestre Moment, MD as PCP - General (Cardiology) [name], MD as PCP - Cardiology (Cardiology) Truitt Merle, MD as Consulting Physician (Hematology) Excell Seltzer, MD as Consulting Physician (General Surgery) Alla Feeling, NP as Nurse Practitioner (Nurse Practitioner) [name], MD as Attending Physician (Cardiology)   Date of Service:  08/25/2017  SUMMARY OF ONCOLOGIC HISTORY: Cancer Staging Carcinoma of upper-inner quadrant of left breast in female, estrogen receptor positive ([place]) Staging form: Breast, AJCC 8th Edition - Clinical stage from 01/18/2017: Stage IB (cT2, cN0, cM0, G3, ER: Positive, PR: Positive, HER2: Positive) - Signed by Truitt Merle, MD on 02/10/2017 - Pathologic stage from 02/23/2017: Stage IB (pT3, pN0, cM0, G3, ER: Positive, PR: Positive, HER2: Positive) - Signed by Alla Feeling, NP on 03/16/2017  Carcinoma of upper-outer quadrant of left breast in female, estrogen receptor positive ([place]) Staging form: Breast, AJCC 8th Edition - Clinical stage from 01/18/2017: Stage IB (cT2, cN0, cM0, G2, ER: Positive, PR: Positive, HER2: Negative) - Unsigned - Pathologic stage from 02/23/2017: Stage IB (pT2, pN0(sn), cM0, G3, ER: Positive, PR: Positive, HER2: Negative) - Signed by Alla Feeling, NP on 03/16/2017  Oncology History   Cancer Staging Carcinoma of upper-inner quadrant of left breast in female, estrogen receptor positive ([place]) Staging form: Breast, AJCC 8th Edition - Clinical stage from 01/18/2017: Stage IB (cT2, cN0, cM0, G3, ER: Positive, PR: Positive, HER2: Positive) - Signed by Truitt Merle, MD on 02/10/2017 - Pathologic stage from 02/23/2017: Stage IB (pT3, pN0, cM0, G3, ER: Positive, PR: Positive, HER2: Positive) - Signed by Alla Feeling, NP on 03/16/2017  Carcinoma of upper-outer quadrant of left breast in female, estrogen  receptor positive ([place]) Staging form: Breast, AJCC 8th Edition - Clinical stage from 01/18/2017: Stage IB (cT2, cN0, cM0, G2, ER: Positive, PR: Positive, HER2: Negative) - Unsigned - Pathologic stage from 02/23/2017: Stage IB (pT2, pN0(sn), cM0, G3, ER: Positive, PR: Positive, HER2: Negative) - Signed by Alla Feeling, NP on 03/16/2017       Carcinoma of upper-outer quadrant of left breast in female, estrogen receptor positive ([place])   01/13/2017 Mammogram    FINDINGS: In the left breast, a spiculated mass lies in the upper outer quadrant. In the medial posterior left breast, there is a larger lobulated mass. These masses correspond to the palpable abnormalities. No other discrete left breast masses.  In the right breast, there are no discrete masses. There are no areas of architectural distortion.  In both breasts there are scattered calcifications, rounded punctate, with a few other larger coarse dystrophic calcifications, without significant change from the previous screening mammogram, which is dated 12/04/2009.  Mammographic images were processed with CAD.  On physical exam, there is a firm palpable mass in the upper outer left breast and another firm mass in the medial left breast.       01/13/2017 Breast US    Targeted ultrasound is performed, showing an irregular hypoechoic shadowing mass in the left breast at the 2:30 o'clock position, 5 cm the nipple, measuring 3.3 x 2.8 x 2.6 cm. In the 10:30 o'clock position of the left breast, 5 cm the nipple, there is irregular hypoechoic mass with somewhat more lobulated margins, corresponding to the lobulated mass seen mammographically. This measures 4.8 x 2.9 x 4.8 cm. Both masses show internal vascularity on color Doppler analysis.  Sonographic evaluation of the left  axilla shows several nodes with thickened cortices. Status cortex measured is 5 mm. None of these lymph nodes appear enlarged but overall size  criteria.  IMPRESSION: 1. Two masses in the left breast, 1 at the 2:30 o'clock position in the other at the 10:30 o'clock position, both highly suspicious for breast carcinoma. 2. Several abnormal left axillary lymph nodes with thickened cortices. 3. No evidence of right breast malignancy.       01/18/2017 Pathology Results    Diagnosis 1. Breast, left, needle core biopsy, 2:30 o'clock - INVASIVE MAMMARY CARCINOMA - MAMMARY CARCINOMA IN-SITU - SEE COMMENT 2. Lymph node, needle/core biopsy, left axilla - NO CARCINOMA IDENTIFIED - SEE COMMENT 3. Breast, left, needle core biopsy, 10:30 o'clock - INVASIVE DUCTAL CARCINOMA - SEE COMMENT  1. PROGNOSTIC INDICATORS Results: IMMUNOHISTOCHEMICAL AND MORPHOMETRIC ANALYSIS PERFORMED MANUALLY Estrogen Receptor: 100%, POSITIVE, STRONG STAINING INTENSITY Progesterone Receptor: 60%, POSITIVE, STRONG STAINING INTENSITY Proliferation Marker Ki67: 60%  1. FLUORESCENCE IN-SITU HYBRIDIZATION Results: HER2 - NEGATIVE RATIO OF HER2/CEP17 SIGNALS 1.28 AVERAGE HER2 COPY NUMBER PER CELL 2.75  3. PROGNOSTIC INDICATORS Results: IMMUNOHISTOCHEMICAL AND MORPHOMETRIC ANALYSIS PERFORMED MANUALLY Estrogen Receptor: 100%, POSITIVE, STRONG STAINING INTENSITY Progesterone Receptor: 50%, POSITIVE, STRONG STAINING INTENSITY Proliferation Marker Ki67: 70%  3. FLUORESCENCE IN-SITU HYBRIDIZATION Results: HER2 - **POSITIVE** RATIO OF HER2/CEP17 SIGNALS 2.80 AVERAGE HER2 COPY NUMBER PER CELL 6.15  Microscopic Comment 1. The biopsy material shows an infiltrative proliferation of cells with large vesicular nuclei with inconspicuous nucleoli, arranged linearly and in small clusters. Based on the biopsy, the carcinoma appears Nottingham grade 2 of 3 and measures 0.9 cm in greatest linear extent. E-cadherin and prognostic markers (ER/PR/ki-67/HER2-FISH)are pending and will be reported in an addendum. Dr. Lyndon Code reviewed the case and agrees with the above  diagnosis.      02/10/2017 Initial Diagnosis    Carcinoma of upper-outer quadrant of left breast in female, estrogen receptor positive ([place])      02/19/2017 Imaging    Bone Scan whole Body 02/19/17 IMPRESSION: Negative for evidence of bony metastatic disease.      02/19/2017 Imaging    CT CAP W Contrast 02/19/17 IMPRESSION: 1. Small right middle lobe pulmonary nodules up to 0.8 cm, nonspecific. Non-contrast chest CT at 3-6 months is recommended. If the nodules are stable at time of repeat CT, then future CT at 18-24 months (from today's scan) is considered optional for low-risk patients, but is recommended for high-risk patients. This recommendation follows the consensus statement: Guidelines for Management of Incidental Pulmonary Nodules Detected on CT Images: From the Fleischner Society 2017; Radiology 2017; 284:228-243. 2. Subcentimeter hepatic lesions likely cysts but incompletely characterized due to small size. Attention on follow-up imaging recommended. 3. 2.1 cm fusiform right common iliac artery aneurysm. Continued surveillance recommended. 4. Aortic Atherosclerosis (ICD10-I70.0) and Emphysema (ICD10-J43.9).      02/23/2017 Pathology Results    Diagnosis 1. Breast, simple mastectomy, Left Total - INVASIVE DUCTAL CARCINOMA, MULTIFOCAL, NOTTINGHAM GRADE 3/3 (5.3 CM, 3.5 CM) - INVASIVE CARCINOMA INVOLVES THE DERMIS - DUCTAL CARCINOMA IN SITU, INTERMEDIATE GRADE - HYALINIZED FIBROADENOMA - MARGINS UNINVOLVED BY CARCINOMA - NO CARCINOMA IDENTIFIED IN TWO LYMPH NODES (0/2) - SEE ONCOLOGY TABLE AND COMMENT BELOW 2. Lymph node, sentinel, biopsy, Left Axillary - NO CARCINOMA IDENTIFIED IN ONE LYMPH NODE (0/1) 3. Lymph node, sentinel, biopsy - NO CARCINOMA IDENTIFIED IN ONE LYMPH NODE (0/1) 4. Lymph node, sentinel, biopsy - NO CARCINOMA IDENTIFIED IN ONE LYMPH NODE (0/1) 5. Lymph node, sentinel, biopsy - NO CARCINOMA IDENTIFIED IN  ONE LYMPH NODE (0/1) Microscopic Comment 1.  BREAST, INVASIVE TUMOR Procedure: Simple mastectomy Laterality: Left Tumor Size: 5. 3 cm, 3.5 cm Histologic Type: Invasive carcinoma of no special type (ductal, not otherwise specified) Grade: Nottingham Grade 3 Tubular Differentiation: 3 Nuclear Pleomorphism: 3 Mitotic Count: 2 Ductal Carcinoma in Situ (DCIS): Present Extent of Tumor: Skin: Invasive carcinoma directly invades into the dermis or epidermis without skin ulceration Margins: Invasive carcinoma, distance from closest margin: 1.5 cm (posterior) DCIS, distance from closest margin: > 1 cm Regional Lymph Nodes: Number of Lymph Nodes Examined: 6 Number of Sentinel Lymph Nodes Examined: 4 Lymph Nodes with Macrometastases: 0 Lymph Nodes with Micrometastases: 0 Lymph Nodes with Isolated Tumor Cells: 0 Treatment effect: No known presurgical therapy Breast Prognostic Profile: See Also ([id number]) Estrogen Receptor: Positive (100%, strong); Positive (100%, strong) Progesterone Receptor: Positive (50%, strong); Positive (50%, strong) Her2: Positive (Ratio 2.80); Negative (Ratio 1.28) Ki-67: 70%; 60% Best tumor block for sendout testing: 1E Pathologic Stage Classification (pTNM, AJCC 8th Edition): Primary Tumor: mpT3 Regional Lymph Nodes: pN0 COMMENT: The two invasive carcinomas in the breast have slightly different morphologies. The larger lesion has a papillary and micropapillary pattern admixed with typical ductal carcinoma while the smaller lesion is more typical of a ductal lesion with linear arrays (E-cadherin performed on biopsy). There are foci within the larger lesion that are concerning for lymphovascular space invasion.       04/07/2017 -  Chemotherapy    weekly taxol and herceptin x12 weeks, 04/07/17-06/23/17. Then herceptin q3 weeks for total 1 year which was switched to [place] on 07/14/17.      07/20/2017 -  Radiation Therapy    She started adjuvant radiation on 07/20/17 with Dr. Lisbeth Renshaw and plans to complete on  09/03/17.       08/24/2017 -  Chemotherapy    The patient had trastuzumab (HERCEPTIN) 525 mg in sodium chloride 0.9 % 250 mL chemo infusion, 8 mg/kg = 525 mg, Intravenous,  Once, 0 of 5 cycles  for chemotherapy treatment.      HISTORY OF PRESENTING ILLNESS:  Penny Huynh 84 y.o. female is here because of the diagnosed left breast cancer, she presents with her husband.  The patient self palpated a left breast mass around 6 months ago, she thought it was calcium deposit as her mother had a history of this.  There was no pain at first but has become intermittently tender which prompted her to seek medical attention with her PCP.  She reports the palpable mass nearly doubled in that time period. No associated breast swelling or nipple discharge.  She reports a decreased appetite lately, denies fatigue or weight loss.  Diagnostic imaging shows an irregular hypoechoic mass in the left breast at the 2:30 o'clock position, 5 cm from the nipple, measuring 3.3 x 2.8 x 2.6 cm.  In the 10:30 o'clock position of the left breast, 5 cm from the nipple, there is irregular hypoechoic mass measuring 4.8 x 2.9 x 4.8 cm.  Ultrasound of the left axilla shows cortical thickening but no enlarged lymph nodes.  Left breast mass at 2:30 o'clock position is positive for invasive mammary carcinoma with mammary carcinoma in situ, ER/PR positive, HER-2 negative; left breast mass at 10:30 o'clock position is positive for invasive ductal carcinoma, ER/PR positive, HER-2 positive.  The biopsied lymph node was negative for carcinoma.  She was then referred to [name] for surgical consult.  Today she reports feeling well with intermittent productive nagging cough with clear  phlegm; no wheezing or dyspnea.  Has occasional burning in hands and feet but not affecting function, she attributes to long-term diabetes.  Denies breast or general pain, as well.  Denies palpitations, chest pain, or lower extremity edema.  In the past she  has history of knee pain, hypertension, hyperlipidemia, CAD, and DM.  In the 1960s she was told she had an enlarged heart but was never told she had a diagnosis of heart failure, no MI or stroke.  She is followed by cardiologist Dr. Bronson Ing. Last echo in 2013 with EF 60-65% and grade I diastolic dysfunction.   She is retired from [employer] but has been recently enrolled in college courses to further her education. She previously exercised 6 days per week but decreased exercise over the last 6 months. She also works at [employer] during tax season. Lives at home with her spouse. She is independent of all ADLs, drives. She drinks alcohol occasionally; former smoker who quit in 2001, previously smoked 2 PPD x46 years.   GYN HISTORY  Menarchal: age [age] LMP: age 46 Contraceptive: none HRT: none GP: G2P0, has 2 grown (foster) children  CURRENT THERAPY:   -weekly taxol and herceptin x12 weeks, 04/07/17-06/23/17. Then herceptin q3 weeks for total 1 year which was switched to [place] on 07/14/17. Will switch back to maintenance Herceptin on September 02, 2017   INTERVAL HISTORY:  JAREE DWIGHT returns for follow up and Herceptin and radiation. Since I last saw her she was seen by NP Lacie and started radiation with Dr. Lisbeth Renshaw.  She presents to the clinic today accompanied by her husband. She notes her feet are in pain from neuropathy. She notes the gabapentin BID at night is not enough. She tried to call the clinic but could not get through. She has Mychart and is willing to send message through there. She notes a prior injury to her left knee and did not need treatment for it. It recently started to bother her which she thinks this is related to her neuropathy.  She notes radiation is going moderately well and has bumps that has surfaced 2 days ago. She notes some pain from radiation.  She notes she needs a refill of amlodipine because her PCP has not been reachable. Her BP has  increased while off. She notes her feet are swelling for the past 2 days bilaterally so she has not been able to wear closed shoes. She is on chlorthalidone. She denies SOB.  She notes lately she has to take time to think about what she will say. Her husband notes her memory is lessen but has been more tired. He has not noticed confusion from her.  She notes she has not been eating much lately, mostly fruit, fresh vegetables and fish. She does not eat much meats. She has been drinking Atkins Protein drinks.     REVIEW OF SYSTEMS:   Constitutional: Denies fatigue, fevers, chills or abnormal weight loss (+) Fatigue (+) weight loss  Eyes: Denies blurriness of vision Ears, nose, mouth, throat, and face: Denies mucositis or sore throat  Respiratory: Denies cough, dyspnea or wheezes Cardiovascular: Denies palpitation, chest discomfort (+)B/l  lower extremity swelling of feet Gastrointestinal:  Denies nausea, vomiting, diarrhea, heartburn  Skin: Denies rashes (+) diffuse bumps over left chest  Lymphatics: Denies new lymphadenopathy or easy bruising MSK: (+) left knee pain from worsening neuropathy  Neurological: Denies new weaknesses  (+) peripheral neuropathy in her hands and feet, worsening up her leg  Behavioral/Psych: Mood is stable, no new changes  All other systems were reviewed with the patient and are negative.  MEDICAL HISTORY:  Past Medical History:  Diagnosis Date  . Arthritis    "maybe a little in my left knee" (02/23/2017)  . Asthmatic bronchitis   . Cancer of left breast ([place])   . Coronary atherosclerosis    Minor nonobstructive CAD at cardiac catheterization 2009  . Dyslipidemia   . Femoral bruit 7/08   With normal ABIs  . Hypertension   . Lower extremity edema    Chronic  . Pneumonia ~ 1950   in 8th grade  . Type II diabetes mellitus ([place])     SURGICAL HISTORY: Past Surgical History:  Procedure Laterality Date  . APPENDECTOMY    . BREAST BIOPSY Left 02/2017  .  CARDIAC CATHETERIZATION     "long time ago" (02/23/2017)  . MASTECTOMY COMPLETE / SIMPLE W/ SENTINEL NODE BIOPSY Left 02/23/2017   TOTAL MATECTOMY;  LEFT AXILLARY SENTINEL LYMPH NODE BIOPSY ERAS PATHWAY  . MASTECTOMY WITH RADIOACTIVE SEED GUIDED EXCISION AND AXILLARY SENTINEL LYMPH NODE BIOPSY Left 02/23/2017   Procedure: LEFT TOTAL MATECTOMY;  LEFT AXILLARY SENTINEL LYMPH NODE BIOPSY ERAS PATHWAY;  Surgeon: Excell Seltzer, MD;  Location: [facility];  Service: General;  Laterality: Left;  . PILONIDAL CYST EXCISION    . PORTA CATH INSERTION  02/23/2017  . PORTACATH PLACEMENT N/A 02/23/2017   Procedure: INSERTION PORT-A-CATH;  Surgeon: Excell Seltzer, MD;  Location: [place] [facility];  Service: General;  Laterality: N/A;  . SHOULDER ARTHROSCOPY W/ ROTATOR CUFF REPAIR Right     I have reviewed the social history and family history with the patient and they are unchanged from previous note.  ALLERGIES:  has No Known Allergies.  MEDICATIONS:  Current Outpatient Medications  Medication Sig Dispense Refill  . amLODipine (NORVASC) 5 MG tablet Take 1 tablet (5 mg total) by mouth daily. 30 tablet 2  . aspirin 81 MG EC tablet Take 81 mg by mouth at bedtime.     . chlorthalidone (HYGROTON) 25 MG tablet Take 25 mg by mouth daily.     . Coenzyme Q10 (COQ10) 100 MG CAPS Take 100 mg by mouth daily.      Marland Kitchen latanoprost (XALATAN) 0.005 % ophthalmic solution Place 1 drop into both eyes at bedtime.    Marland Kitchen losartan (COZAAR) 50 MG tablet Take 50 mg by mouth daily.    . metFORMIN (GLUCOPHAGE) 500 MG tablet Take 1,000 mg by mouth daily with breakfast.     . traMADol (ULTRAM) 50 MG tablet Take 1 tablet (50 mg total) by mouth every 6 (six) hours as needed (mild pain). 15 tablet 0  . vitamin B-12 (CYANOCOBALAMIN) 1000 MCG tablet Take 1,000 mcg by mouth daily.    Marland Kitchen gabapentin (NEURONTIN) 300 MG capsule Take 1 capsule (300 mg total) by mouth 3 (three) times daily. 60 capsule 0  . potassium chloride SA (K-DUR,KLOR-CON) 20 MEQ  tablet Take 1 tablet (20 mEq total) by mouth 2 (two) times daily. 60 tablet 1   No current facility-administered medications for this visit.     PHYSICAL EXAMINATION:  ECOG PERFORMANCE STATUS: 3  Vitals:   08/25/17 1110  BP: (!) 173/84  Pulse: 74  Resp: 18  Temp: 97.6 F (36.4 C)  SpO2: 100%   Filed Weights   08/25/17 1110  Weight: 142 lb 11.2 oz (64.7 kg)    GENERAL:alert, no distress and comfortable, in a wheelchair  SKIN: skin color, texture, turgor  are normal  (+) mild bumps over left chest from radiation EYES: normal, Conjunctiva are pink and non-injected, sclera clear OROPHARYNX:no exudate, no erythema and lips, buccal mucosa, and tongue normal  NECK: supple, thyroid normal size, non-tender, without nodularity LYMPH:  no palpable cervical, supraclavicular, or axillary lymphadenopathy  LUNGS: clear to auscultation bilaterally with normal breathing effort HEART: regular rate & rhythm and no murmurs (+) B/l lower extremity edema of feet ABDOMEN:abdomen soft, non-tender and normal bowel sounds Musculoskeletal:no cyanosis of digits and no clubbing  NEURO: alert & oriented x 3 with fluent speech, no focal motor/sensory deficits BREASTS: s/p left mastectomy, surgical incision healing well PAC without erythema   LABORATORY DATA:  I have reviewed the data as listed CBC Latest Ref Rng & Units 08/25/2017 08/04/2017 07/14/2017  WBC 3.9 - 10.3 K/uL 2.5(L) 2.5(L) 3.3(L)  Hemoglobin 11.6 - 15.9 g/dL 11.6 11.0(L) 10.8(L)  Hematocrit 34.8 - 46.6 % 33.6(L) 33.9(L) 32.9(L)  Platelets 145 - 400 K/uL 224 284 233     CMP Latest Ref Rng & Units 08/25/2017 08/04/2017 07/14/2017  Glucose 70 - 99 mg/dL 96 64(L) 85  BUN 8 - 23 mg/dL '9 10 10  '$ Creatinine 0.44 - 1.00 mg/dL 0.72 0.72 0.71  Sodium 135 - 145 mmol/L 122(L) 127(L) 130(L)  Potassium 3.5 - 5.1 mmol/L 2.9(LL) 3.4(L) 3.6  Chloride 98 - 111 mmol/L 84(L) 90(L) 94(L)  CO2 22 - 32 mmol/L '27 30 29  '$ Calcium 8.9 - 10.3 mg/dL 10.0 10.0 9.5   Total Protein 6.5 - 8.1 g/dL 7.5 6.9 6.6  Total Bilirubin 0.3 - 1.2 mg/dL 0.9 0.6 0.7  Alkaline Phos 38 - 126 U/L 54 60 53  AST 15 - 41 U/L '30 23 18  '$ ALT 0 - 44 U/L '21 24 9   '$ ANC 1.6K today   PATHOLOGY  LEFT SIMPLE MASTECTOMY WITH SNLB 02/23/17 per [name] Diagnosis 1. Breast, simple mastectomy, Left Total - INVASIVE DUCTAL CARCINOMA, MULTIFOCAL, NOTTINGHAM GRADE 3/3 (5.3 CM, 3.5 CM) - INVASIVE CARCINOMA INVOLVES THE DERMIS - DUCTAL CARCINOMA IN SITU, INTERMEDIATE GRADE - HYALINIZED FIBROADENOMA - MARGINS UNINVOLVED BY CARCINOMA - NO CARCINOMA IDENTIFIED IN TWO LYMPH NODES (0/2) - SEE ONCOLOGY TABLE AND COMMENT BELOW 2. Lymph node, sentinel, biopsy, Left Axillary - NO CARCINOMA IDENTIFIED IN ONE LYMPH NODE (0/1) 3. Lymph node, sentinel, biopsy - NO CARCINOMA IDENTIFIED IN ONE LYMPH NODE (0/1) 4. Lymph node, sentinel, biopsy - NO CARCINOMA IDENTIFIED IN ONE LYMPH NODE (0/1) 5. Lymph node, sentinel, biopsy - NO CARCINOMA IDENTIFIED IN ONE LYMPH NODE (0/1) Microscopic Comment 1. BREAST, INVASIVE TUMOR Procedure: Simple mastectomy Laterality: Left Tumor Size: 5. 3 cm, 3.5 cm Histologic Type: Invasive carcinoma of no special type (ductal, not otherwise specified) Grade: Nottingham Grade 3 Tubular Differentiation: 3 Nuclear Pleomorphism: 3 Mitotic Count: 2 Ductal Carcinoma in Situ (DCIS): Present Extent of Tumor: Skin: Invasive carcinoma directly invades into the dermis or epidermis without skin ulceration Margins: Invasive carcinoma, distance from closest margin: 1.5 cm (posterior) DCIS, distance from closest margin: > 1 cm Regional Lymph Nodes: Number of Lymph Nodes Examined: 6 Number of Sentinel Lymph Nodes Examined: 4 Lymph Nodes with Macrometastases: 0 Lymph Nodes with Micrometastases: 0 Lymph Nodes with Isolated Tumor Cells: 0 Treatment effect: No known presurgical therapy Breast Prognostic Profile: See Also ([id number]) Estrogen Receptor: Positive (100%,  strong); Positive (100%, strong) Progesterone Receptor: Positive (50%, strong); Positive (50%, strong) Her2: Positive (Ratio 2.80); Negative (Ratio 1.28) Ki-67: 70%; 60% Best tumor block for sendout testing: 1E Pathologic Stage  Classification (pTNM, AJCC 8th Edition): Primary Tumor: mpT3 Regional Lymph Nodes: pN0 COMMENT: The two invasive carcinomas in the breast have slightly different morphologies. The larger lesion has a papillary and micropapillary pattern admixed with typical ductal carcinoma while the smaller lesion is more typical of a ductal lesion with linear arrays (E-cadherin performed on biopsy). There are foci within the larger lesion that are concerning for lymphovascular space invasion.   02/17/17 ECHO: Study Conclusions - Left ventricle: The cavity size was normal. Wall thickness was   increased in a pattern of mild LVH. Systolic function was normal.   The estimated ejection fraction was in the range of 55% to 60%.   Normal GLS at -20%. Wall motion was normal; there were no   regional wall motion abnormalities. Doppler parameters are   consistent with abnormal left ventricular relaxation (grade 1   diastolic dysfunction). The E/e&' ratio is between 8-15,   suggesting indeterminate LV filling pressure. - Aortic valve: Sclerosis without stenosis. - Aorta: Mildly dilated aorta. Aortic root dimension: 39 mm (ED).   Ascending aortic diameter: 38 mm (S). - Left atrium: The atrium was normal in size. - Inferior vena cava: The vessel was normal in size. The   respirophasic diameter changes were in the normal range (>= 50%),   consistent with normal central venous pressure. Impressions: - Compared to a prior study in 2013, the LVEF is slightly lower but   normal at 55-60%. There is mild LVH, normal GLS at -20%, grade 1   DD and indeterminate LV filling pressure. The aorta is mildly   dilated at 3.8-3.9 cm.   RADIOGRAPHIC STUDIES: I have personally reviewed the radiological  images as listed and agreed with the findings in the report. No results found.    Bone Scan whole Body 02/19/17 IMPRESSION: Negative for evidence of bony metastatic disease.   CT CAP W Contrast 02/19/17 IMPRESSION: 1. Small right middle lobe pulmonary nodules up to 0.8 cm, nonspecific. Non-contrast chest CT at 3-6 months is recommended. If the nodules are stable at time of repeat CT, then future CT at 18-24 months (from today's scan) is considered optional for low-risk patients, but is recommended for high-risk patients. This recommendation follows the consensus statement: Guidelines for Management of Incidental Pulmonary Nodules Detected on CT Images: From the Fleischner Society 2017; Radiology 2017; 284:228-243. 2. Subcentimeter hepatic lesions likely cysts but incompletely characterized due to small size. Attention on follow-up imaging recommended. 3. 2.1 cm fusiform right common iliac artery aneurysm. Continued surveillance recommended. 4. Aortic Atherosclerosis (ICD10-I70.0) and Emphysema (ICD10-J43.9).   ASSESSMENT & PLAN: Petrea Fredenburg is an 84 y.o. female with past medical history positive for HTN, DM, CAD now with newly diagnosed left breast cancer  1.  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   -We previously reviewed her pathology results in detail, she had multifocal invasive ductal carcinoma, G3, the upper inner quadrant focus spanning 5.3 cm, ER+/PR+/HER2+; the upper outer quadrant focus spanned 3.5 cm, ER+/PR+/HER2- node-negative.  -We previously discussed the high risk of recurrence due to HER-2  positive T3 disease, I previously recommended her to consider adjuvant chemotherapy. Due to her advanced  age, I recommended weekly Taxol and Herceptin for 12 weeks, followed by Herceptin maintenance therapy for a total of 1 year. She began this regimen on 04/07/17. She has been tolerating treatment well overall. She has completed Taxol therapy on 06/23/17.  -based on recent adjuvant T-DM1 data, I previously recommended her to switch Herceptin maintenance to Kadcyla maintenance, to complete 1 year therapy.  She agreed and switched on 07/14/17 and tolerated well.  -she now complains significant worsening of peripheral neuropathy, especially on her feet, and a bilateral lower extremity edema.  She has not been able to walk for long distance and came in a wheelchair today.  Lab reviewed, she had mild hyponatremia before, much worse today with sodium 122 today. -I am concerned that her worsening neuropathy and leg edema could be related to Kadcyla, I will switch back to Herceptin, starting next week.  Hold treatment today. -She started adjuvant radiation on 07/20/17 with Dr. Lisbeth Renshaw and plans to complete on 09/03/17. -Previously I briefly discussed with the patient the use of anastrozole. We will discuss this further after she completes radiation.   -F/u next week   2. HTN, CAD, DM -She is on amlodipine, chlorthalidone, and losartan and metformin BID for DM -We previously reviewed DM can worsen while on chemotherapy especially with steroid administration; will monitor closely.  -She will continue to monitor BG at home and see PCP for any needed medication adjustments. DM is well controlled now.  -Her 07/08/17 ECHO with EF of 55% -She ran out of Amlodipine recently  and cannot reach her PCP. Her BP increased to 173/84 today (08/25/17). I will refill her Amlodipine today.   -She has been experiencing b/l feet swelling the past few days. She is currently on chlorthalidone. I will speak with Dr. Bronson Ing about switching  her to lasix.    3. Hyponatremia, Hypokalemia  -She is not on SSRI.   -On chlorthalidone diuretic for HTN.  -Encouraged to monitor her BP. She is asymptomatic; will monitor closely.  -Na 122  and K at 2.9 today (08/25/17). I encouraged her to add salt in her diet and I will prescribe her oral potassium BID today.  -will give iv KCL 18mq today -she knows to go to ED if she has confusion or other neurological symptoms   4. 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 -I previously iced her hands and feet during chemo and taxol dose was reduced accordingly and has completed.  -She started Gabapentin 100 mg in 08/2017. -Her neuropathy has worsened, especially at night and has progressed up her legs.  -Today (08/25/17) I increase her Gabapentin '300mg'$  HS and she will titrate up once at night to 1 in the AM and PM and then 1 in the AM and 2 in the PM.  -She has been on B12 supplement, I recommend she switch to B complex.  -She ambulates with cane and walker and has had to stop PT for now.  -I filled her Handicap sticker today as well.   5. B/l leg edema  -She has developed significant bilateral lower extremity edema, not able to wear normal shoes -No calf tenderness, low possibility for DVT.  It is possible related to TDM1  PLAN: -I increased and filled her Gabapentin '300mg'$  HS and she will use during the day as needed also -call in oral potassium 217m to take BID. She  will receive iv KCL 19mq today  -I will contact her cardiologist about her diuretics -I will fill out a handicap sticker for her  -Due to the worsening neuropathy, peripheral edema, and hyponatremia, I will hold Kadcyla, and switch back to Herceptin maintenance therapy, starting next week. -Lab, flush and f/u with Herceptin in one week     All questions were answered.  The patient knows to call the clinic with any problems, questions or concerns. No barriers to learning was detected.  I spent 35 minutes counseling the patient face to face. The total time spent in the appointment was 40 minutes and more than 50% was on counseling and review of test results  I, AJoslyn Devon am acting as scribe for YTruitt Merle MD.   I have reviewed the above documentation for accuracy and completeness, and I agree with the above.     YTruitt Merle MD 08/25/2017

## 2017-08-25 ENCOUNTER — Inpatient Hospital Stay: Payer: Medicare Other

## 2017-08-25 ENCOUNTER — Inpatient Hospital Stay (HOSPITAL_BASED_OUTPATIENT_CLINIC_OR_DEPARTMENT_OTHER): Payer: Medicare Other | Admitting: Hematology

## 2017-08-25 ENCOUNTER — Encounter: Payer: Self-pay | Admitting: Hematology

## 2017-08-25 ENCOUNTER — Ambulatory Visit
Admission: RE | Admit: 2017-08-25 | Discharge: 2017-08-25 | Disposition: A | Discharge status: Home / Self Care | Payer: Medicare Other | Source: Ambulatory Visit | Attending: Radiation Oncology | Admitting: Radiation Oncology

## 2017-08-25 VITALS — BP 173/84 | HR 74 | Temp 97.6°F | Resp 18 | Ht 64.5 in | Wt 142.7 lb

## 2017-08-25 DIAGNOSIS — I2510 Atherosclerotic heart disease of native coronary artery without angina pectoris: Secondary | ICD-10-CM

## 2017-08-25 DIAGNOSIS — E871 Hypo-osmolality and hyponatremia: Secondary | ICD-10-CM | POA: Diagnosis not present

## 2017-08-25 DIAGNOSIS — E876 Hypokalemia: Secondary | ICD-10-CM | POA: Diagnosis not present

## 2017-08-25 DIAGNOSIS — Z170 Estrogen receptor positive status [ER+]: Secondary | ICD-10-CM

## 2017-08-25 DIAGNOSIS — C50412 Malignant neoplasm of upper-outer quadrant of left female breast: Secondary | ICD-10-CM | POA: Diagnosis not present

## 2017-08-25 DIAGNOSIS — I10 Essential (primary) hypertension: Secondary | ICD-10-CM | POA: Diagnosis not present

## 2017-08-25 DIAGNOSIS — C50212 Malignant neoplasm of upper-inner quadrant of left female breast: Secondary | ICD-10-CM

## 2017-08-25 DIAGNOSIS — Z95828 Presence of other vascular implants and grafts: Secondary | ICD-10-CM

## 2017-08-25 DIAGNOSIS — G629 Polyneuropathy, unspecified: Secondary | ICD-10-CM | POA: Diagnosis not present

## 2017-08-25 DIAGNOSIS — Z79899 Other long term (current) drug therapy: Secondary | ICD-10-CM | POA: Diagnosis not present

## 2017-08-25 DIAGNOSIS — Z510 Encounter for antineoplastic radiation therapy: Secondary | ICD-10-CM | POA: Diagnosis not present

## 2017-08-25 DIAGNOSIS — E119 Type 2 diabetes mellitus without complications: Secondary | ICD-10-CM

## 2017-08-25 DIAGNOSIS — Z5111 Encounter for antineoplastic chemotherapy: Secondary | ICD-10-CM | POA: Diagnosis not present

## 2017-08-25 LAB — CMP (CANCER CENTER ONLY)
ALK PHOS: 54 U/L (ref 38–126)
ALT: 21 U/L (ref 0–44)
AST: 30 U/L (ref 15–41)
Albumin: 4.1 g/dL (ref 3.5–5.0)
Anion gap: 11 (ref 5–15)
BILIRUBIN TOTAL: 0.9 mg/dL (ref 0.3–1.2)
BUN: 9 mg/dL (ref 8–23)
CALCIUM: 10 mg/dL (ref 8.9–10.3)
CO2: 27 mmol/L (ref 22–32)
Chloride: 84 mmol/L — ABNORMAL LOW (ref 98–111)
Creatinine: 0.72 mg/dL (ref 0.44–1.00)
GFR, Estimated: >60 mL/min (ref >60)
Glucose, Bld: 96 mg/dL (ref 70–99)
POTASSIUM: 2.9 mmol/L — AB (ref 3.5–5.1)
Sodium: 122 mmol/L — ABNORMAL LOW (ref 135–145)
TOTAL PROTEIN: 7.5 g/dL (ref 6.5–8.1)

## 2017-08-25 LAB — CBC WITH DIFFERENTIAL (CANCER CENTER ONLY)
Basophils Absolute: 0 10*3/uL (ref 0.0–0.1)
Basophils Relative: 1 %
EOS PCT: 0 %
Eosinophils Absolute: 0 10*3/uL (ref 0.0–0.5)
HCT: 33.6 % — ABNORMAL LOW (ref 34.8–46.6)
Hemoglobin: 11.6 g/dL (ref 11.6–15.9)
LYMPHS ABS: 0.4 10*3/uL — AB (ref 0.9–3.3)
Lymphocytes Relative: 16 %
MCH: 29.8 pg (ref 25.1–34.0)
MCHC: 34.5 g/dL (ref 31.5–36.0)
MCV: 86.4 fL (ref 79.5–101.0)
MONO ABS: 0.5 10*3/uL (ref 0.1–0.9)
Monocytes Relative: 18 %
Neutro Abs: 1.6 10*3/uL (ref 1.5–6.5)
Neutrophils Relative %: 65 %
PLATELETS: 224 10*3/uL (ref 145–400)
RBC: 3.89 MIL/uL (ref 3.70–5.45)
RDW: 13.2 % (ref 11.2–14.5)
WBC Count: 2.5 10*3/uL — ABNORMAL LOW (ref 3.9–10.3)

## 2017-08-25 MED ORDER — GABAPENTIN 300 MG PO CAPS: 300.0000 mg | ORAL_CAPSULE | Freq: Three times a day (TID) | ORAL | 0 refills | Status: DC

## 2017-08-25 MED ORDER — AMLODIPINE BESYLATE 5 MG PO TABS: 5.0000 mg | ORAL_TABLET | Freq: Every day | ORAL | 2 refills | Status: DC

## 2017-08-25 MED ORDER — HEPARIN SOD (PORK) LOCK FLUSH 100 UNIT/ML IV SOLN
500.0000 [IU] | Freq: Once | INTRAVENOUS | Status: AC | PRN
  Administered 2017-08-25: 500 [IU]
  Filled 2017-08-25: qty 5

## 2017-08-25 MED ORDER — POTASSIUM CHLORIDE 10 MEQ/100ML IV SOLN
10.0000 meq | INTRAVENOUS | Status: AC
  Administered 2017-08-25 (×3): 10 meq via INTRAVENOUS
  Filled 2017-08-25: qty 100

## 2017-08-25 MED ORDER — SODIUM CHLORIDE 0.9% FLUSH
10.0000 mL | INTRAVENOUS | Status: DC | PRN
  Administered 2017-08-25: 10 mL
  Filled 2017-08-25: qty 10

## 2017-08-25 MED ORDER — POTASSIUM CHLORIDE CRYS ER 20 MEQ PO TBCR: 20.0000 meq | EXTENDED_RELEASE_TABLET | Freq: Two times a day (BID) | ORAL | 1 refills | Status: DC

## 2017-08-25 MED ORDER — POTASSIUM CHLORIDE 10 MEQ/100ML IV SOLN
10.0000 meq | INTRAVENOUS | Status: DC
  Filled 2017-08-25: qty 100

## 2017-08-25 NOTE — Patient Instructions (Signed)

## 2017-08-26 ENCOUNTER — Ambulatory Visit
Admission: RE | Admit: 2017-08-26 | Discharge: 2017-08-26 | Disposition: A | Discharge status: Home / Self Care | Payer: Medicare Other | Source: Ambulatory Visit | Attending: Radiation Oncology | Admitting: Radiation Oncology

## 2017-08-26 DIAGNOSIS — C50212 Malignant neoplasm of upper-inner quadrant of left female breast: Secondary | ICD-10-CM | POA: Diagnosis not present

## 2017-08-26 DIAGNOSIS — Z510 Encounter for antineoplastic radiation therapy: Secondary | ICD-10-CM | POA: Diagnosis not present

## 2017-08-26 DIAGNOSIS — Z170 Estrogen receptor positive status [ER+]: Secondary | ICD-10-CM | POA: Diagnosis not present

## 2017-08-27 ENCOUNTER — Ambulatory Visit: Payer: Medicare Other | Admitting: Physical Therapy

## 2017-08-27 ENCOUNTER — Ambulatory Visit
Admission: RE | Admit: 2017-08-27 | Discharge: 2017-08-27 | Disposition: A | Discharge status: Home / Self Care | Payer: Medicare Other | Source: Ambulatory Visit | Attending: Radiation Oncology | Admitting: Radiation Oncology

## 2017-08-27 DIAGNOSIS — C50212 Malignant neoplasm of upper-inner quadrant of left female breast: Secondary | ICD-10-CM | POA: Diagnosis not present

## 2017-08-27 DIAGNOSIS — Z170 Estrogen receptor positive status [ER+]: Secondary | ICD-10-CM | POA: Diagnosis not present

## 2017-08-27 DIAGNOSIS — Z510 Encounter for antineoplastic radiation therapy: Secondary | ICD-10-CM | POA: Diagnosis not present

## 2017-08-30 ENCOUNTER — Ambulatory Visit
Admission: RE | Admit: 2017-08-30 | Discharge: 2017-08-30 | Disposition: A | Discharge status: Home / Self Care | Payer: Medicare Other | Source: Ambulatory Visit | Attending: Radiation Oncology | Admitting: Radiation Oncology

## 2017-08-30 DIAGNOSIS — C50212 Malignant neoplasm of upper-inner quadrant of left female breast: Secondary | ICD-10-CM | POA: Diagnosis not present

## 2017-08-30 DIAGNOSIS — Z170 Estrogen receptor positive status [ER+]: Secondary | ICD-10-CM | POA: Diagnosis not present

## 2017-08-30 DIAGNOSIS — Z510 Encounter for antineoplastic radiation therapy: Secondary | ICD-10-CM | POA: Diagnosis not present

## 2017-08-31 ENCOUNTER — Ambulatory Visit
Admission: RE | Admit: 2017-08-31 | Discharge: 2017-08-31 | Disposition: A | Discharge status: Home / Self Care | Payer: Medicare Other | Source: Ambulatory Visit | Attending: Radiation Oncology | Admitting: Radiation Oncology

## 2017-08-31 ENCOUNTER — Encounter: Payer: Self-pay | Admitting: Hematology

## 2017-08-31 DIAGNOSIS — C50212 Malignant neoplasm of upper-inner quadrant of left female breast: Secondary | ICD-10-CM | POA: Diagnosis not present

## 2017-08-31 DIAGNOSIS — Z170 Estrogen receptor positive status [ER+]: Secondary | ICD-10-CM | POA: Diagnosis not present

## 2017-08-31 DIAGNOSIS — Z510 Encounter for antineoplastic radiation therapy: Secondary | ICD-10-CM | POA: Diagnosis not present

## 2017-09-01 ENCOUNTER — Ambulatory Visit
Admission: RE | Admit: 2017-09-01 | Discharge: 2017-09-01 | Disposition: A | Discharge status: Home / Self Care | Payer: Medicare Other | Source: Ambulatory Visit | Attending: Radiation Oncology | Admitting: Radiation Oncology

## 2017-09-01 DIAGNOSIS — Z170 Estrogen receptor positive status [ER+]: Secondary | ICD-10-CM | POA: Diagnosis not present

## 2017-09-01 DIAGNOSIS — Z510 Encounter for antineoplastic radiation therapy: Secondary | ICD-10-CM | POA: Diagnosis not present

## 2017-09-01 DIAGNOSIS — C50212 Malignant neoplasm of upper-inner quadrant of left female breast: Secondary | ICD-10-CM | POA: Diagnosis not present

## 2017-09-02 ENCOUNTER — Ambulatory Visit
Admission: RE | Admit: 2017-09-02 | Discharge: 2017-09-02 | Disposition: A | Discharge status: Home / Self Care | Payer: Medicare Other | Source: Ambulatory Visit | Attending: Radiation Oncology | Admitting: Radiation Oncology

## 2017-09-02 ENCOUNTER — Encounter: Payer: Self-pay | Admitting: Nurse Practitioner

## 2017-09-02 ENCOUNTER — Inpatient Hospital Stay: Payer: Medicare Other

## 2017-09-02 ENCOUNTER — Telehealth: Payer: Self-pay | Admitting: Hematology

## 2017-09-02 ENCOUNTER — Inpatient Hospital Stay: Payer: Medicare Other | Attending: Nurse Practitioner | Admitting: Nurse Practitioner

## 2017-09-02 VITALS — BP 148/73 | HR 76 | Temp 97.7°F | Resp 17 | Ht 64.5 in | Wt 143.8 lb

## 2017-09-02 DIAGNOSIS — Z9012 Acquired absence of left breast and nipple: Secondary | ICD-10-CM | POA: Insufficient documentation

## 2017-09-02 DIAGNOSIS — G620 Drug-induced polyneuropathy: Secondary | ICD-10-CM

## 2017-09-02 DIAGNOSIS — E119 Type 2 diabetes mellitus without complications: Secondary | ICD-10-CM | POA: Insufficient documentation

## 2017-09-02 DIAGNOSIS — T300 Burn of unspecified body region, unspecified degree: Secondary | ICD-10-CM

## 2017-09-02 DIAGNOSIS — I10 Essential (primary) hypertension: Secondary | ICD-10-CM | POA: Insufficient documentation

## 2017-09-02 DIAGNOSIS — C50412 Malignant neoplasm of upper-outer quadrant of left female breast: Secondary | ICD-10-CM | POA: Diagnosis not present

## 2017-09-02 DIAGNOSIS — Z170 Estrogen receptor positive status [ER+]: Principal | ICD-10-CM

## 2017-09-02 DIAGNOSIS — Z5111 Encounter for antineoplastic chemotherapy: Secondary | ICD-10-CM | POA: Diagnosis not present

## 2017-09-02 DIAGNOSIS — G629 Polyneuropathy, unspecified: Secondary | ICD-10-CM | POA: Diagnosis not present

## 2017-09-02 DIAGNOSIS — E871 Hypo-osmolality and hyponatremia: Secondary | ICD-10-CM | POA: Diagnosis not present

## 2017-09-02 DIAGNOSIS — I2510 Atherosclerotic heart disease of native coronary artery without angina pectoris: Secondary | ICD-10-CM | POA: Insufficient documentation

## 2017-09-02 DIAGNOSIS — C50212 Malignant neoplasm of upper-inner quadrant of left female breast: Secondary | ICD-10-CM | POA: Insufficient documentation

## 2017-09-02 DIAGNOSIS — Z510 Encounter for antineoplastic radiation therapy: Secondary | ICD-10-CM | POA: Diagnosis not present

## 2017-09-02 DIAGNOSIS — E876 Hypokalemia: Secondary | ICD-10-CM | POA: Diagnosis not present

## 2017-09-02 DIAGNOSIS — Z95828 Presence of other vascular implants and grafts: Secondary | ICD-10-CM

## 2017-09-02 DIAGNOSIS — T451X5A Adverse effect of antineoplastic and immunosuppressive drugs, initial encounter: Secondary | ICD-10-CM

## 2017-09-02 DIAGNOSIS — Z79899 Other long term (current) drug therapy: Secondary | ICD-10-CM | POA: Insufficient documentation

## 2017-09-02 DIAGNOSIS — D493 Neoplasm of unspecified behavior of breast: Secondary | ICD-10-CM

## 2017-09-02 DIAGNOSIS — Z923 Personal history of irradiation: Secondary | ICD-10-CM | POA: Diagnosis not present

## 2017-09-02 LAB — CMP (CANCER CENTER ONLY)
ALT: 15 U/L (ref 0–44)
ANION GAP: 9 (ref 5–15)
AST: 19 U/L (ref 15–41)
Albumin: 3.8 g/dL (ref 3.5–5.0)
Alkaline Phosphatase: 51 U/L (ref 38–126)
BILIRUBIN TOTAL: 0.7 mg/dL (ref 0.3–1.2)
BUN: 7 mg/dL — AB (ref 8–23)
CHLORIDE: 91 mmol/L — AB (ref 98–111)
CO2: 28 mmol/L (ref 22–32)
Calcium: 9.8 mg/dL (ref 8.9–10.3)
Creatinine: 0.68 mg/dL (ref 0.44–1.00)
GFR, Est AFR Am: >60 mL/min (ref >60)
Glucose, Bld: 104 mg/dL — ABNORMAL HIGH (ref 70–99)
POTASSIUM: 3.3 mmol/L — AB (ref 3.5–5.1)
Sodium: 128 mmol/L — ABNORMAL LOW (ref 135–145)
TOTAL PROTEIN: 7.2 g/dL (ref 6.5–8.1)

## 2017-09-02 LAB — CBC WITH DIFFERENTIAL (CANCER CENTER ONLY)
BASOS ABS: 0 10*3/uL (ref 0.0–0.1)
Basophils Relative: 1 %
EOS PCT: 1 %
Eosinophils Absolute: 0 10*3/uL (ref 0.0–0.5)
HEMATOCRIT: 33.4 % — AB (ref 34.8–46.6)
Hemoglobin: 11.2 g/dL — ABNORMAL LOW (ref 11.6–15.9)
LYMPHS PCT: 13 %
Lymphs Abs: 0.3 10*3/uL — ABNORMAL LOW (ref 0.9–3.3)
MCH: 29.8 pg (ref 25.1–34.0)
MCHC: 33.7 g/dL (ref 31.5–36.0)
MCV: 88.7 fL (ref 79.5–101.0)
MONO ABS: 0.4 10*3/uL (ref 0.1–0.9)
MONOS PCT: 19 %
Neutro Abs: 1.5 10*3/uL (ref 1.5–6.5)
Neutrophils Relative %: 66 %
PLATELETS: 213 10*3/uL (ref 145–400)
RBC: 3.77 MIL/uL (ref 3.70–5.45)
RDW: 14.7 % — AB (ref 11.2–14.5)
WBC Count: 2.3 10*3/uL — ABNORMAL LOW (ref 3.9–10.3)

## 2017-09-02 MED ORDER — DIPHENHYDRAMINE HCL 25 MG PO CAPS
ORAL_CAPSULE | ORAL | Status: AC
  Filled 2017-09-02: qty 2

## 2017-09-02 MED ORDER — HYDROCODONE-ACETAMINOPHEN 5-325 MG PO TABS: 1.0000 | ORAL_TABLET | Freq: Four times a day (QID) | ORAL | 0 refills | Status: DC | PRN

## 2017-09-02 MED ORDER — SODIUM CHLORIDE 0.9% FLUSH
10.0000 mL | INTRAVENOUS | Status: DC | PRN
  Administered 2017-09-02: 10 mL
  Filled 2017-09-02: qty 10

## 2017-09-02 MED ORDER — ACETAMINOPHEN 325 MG PO TABS
650.0000 mg | ORAL_TABLET | Freq: Once | ORAL | Status: AC
  Administered 2017-09-02: 650 mg via ORAL

## 2017-09-02 MED ORDER — ACETAMINOPHEN 325 MG PO TABS
ORAL_TABLET | ORAL | Status: AC
  Filled 2017-09-02: qty 2

## 2017-09-02 MED ORDER — TRASTUZUMAB CHEMO 150 MG IV SOLR
8.0000 mg/kg | Freq: Once | INTRAVENOUS | Status: AC
  Administered 2017-09-02: 525 mg via INTRAVENOUS
  Filled 2017-09-02: qty 25

## 2017-09-02 MED ORDER — SODIUM CHLORIDE 0.9 % IV SOLN
Freq: Once | INTRAVENOUS | Status: AC
  Administered 2017-09-02: 11:00:00 via INTRAVENOUS
  Filled 2017-09-02: qty 250

## 2017-09-02 MED ORDER — DIPHENHYDRAMINE HCL 25 MG PO CAPS
50.0000 mg | ORAL_CAPSULE | Freq: Once | ORAL | Status: AC
  Administered 2017-09-02: 50 mg via ORAL

## 2017-09-02 MED ORDER — HEPARIN SOD (PORK) LOCK FLUSH 100 UNIT/ML IV SOLN
500.0000 [IU] | Freq: Once | INTRAVENOUS | Status: DC | PRN
  Filled 2017-09-02: qty 5

## 2017-09-02 MED ORDER — HEPARIN SOD (PORK) LOCK FLUSH 100 UNIT/ML IV SOLN
500.0000 [IU] | Freq: Once | INTRAVENOUS | Status: AC | PRN
Start: 2017-09-02 — End: 2017-09-02
  Administered 2017-09-02: 500 [IU]
  Filled 2017-09-02: qty 5

## 2017-09-02 NOTE — Progress Notes (Signed)
[place]  [phone] [phone]  Clinic Follow up Note   Patient Care Team: Sasser, Silvestre Moment, MD as PCP - General (Cardiology) [name], MD as PCP - Cardiology (Cardiology) Truitt Merle, MD as Consulting Physician (Hematology) Excell Seltzer, MD as Consulting Physician (General Surgery) Alla Feeling, NP as Nurse Practitioner (Nurse Practitioner) [name], MD as Attending Physician (Cardiology) 09/02/2017  SUMMARY OF ONCOLOGIC HISTORY: Oncology History   Cancer Staging Carcinoma of upper-inner quadrant of left breast in female, estrogen receptor positive ([place]) Staging form: Breast, AJCC 8th Edition - Clinical stage from 01/18/2017: Stage IB (cT2, cN0, cM0, G3, ER: Positive, PR: Positive, HER2: Positive) - Signed by Truitt Merle, MD on 02/10/2017 - Pathologic stage from 02/23/2017: Stage IB (pT3, pN0, cM0, G3, ER: Positive, PR: Positive, HER2: Positive) - Signed by Alla Feeling, NP on 03/16/2017  Carcinoma of upper-outer quadrant of left breast in female, estrogen receptor positive ([place]) Staging form: Breast, AJCC 8th Edition - Clinical stage from 01/18/2017: Stage IB (cT2, cN0, cM0, G2, ER: Positive, PR: Positive, HER2: Negative) - Unsigned - Pathologic stage from 02/23/2017: Stage IB (pT2, pN0(sn), cM0, G3, ER: Positive, PR: Positive, HER2: Negative) - Signed by Alla Feeling, NP on 03/16/2017       Carcinoma of upper-outer quadrant of left breast in female, estrogen receptor positive ([place])   01/13/2017 Mammogram    FINDINGS: In the left breast, a spiculated mass lies in the upper outer quadrant. In the medial posterior left breast, there is a larger lobulated mass. These masses correspond to the palpable abnormalities. No other discrete left breast masses.  In the right breast, there are no discrete masses. There are no areas of architectural distortion.  In both breasts there are scattered calcifications,  rounded punctate, with a few other larger coarse dystrophic calcifications, without significant change from the previous screening mammogram, which is dated 12/04/2009.  Mammographic images were processed with CAD.  On physical exam, there is a firm palpable mass in the upper outer left breast and another firm mass in the medial left breast.       01/13/2017 Breast US    Targeted ultrasound is performed, showing an irregular hypoechoic shadowing mass in the left breast at the 2:30 o'clock position, 5 cm the nipple, measuring 3.3 x 2.8 x 2.6 cm. In the 10:30 o'clock position of the left breast, 5 cm the nipple, there is irregular hypoechoic mass with somewhat more lobulated margins, corresponding to the lobulated mass seen mammographically. This measures 4.8 x 2.9 x 4.8 cm. Both masses show internal vascularity on color Doppler analysis.  Sonographic evaluation of the left axilla shows several nodes with thickened cortices. Status cortex measured is 5 mm. None of these lymph nodes appear enlarged but overall size criteria.  IMPRESSION: 1. Two masses in the left breast, 1 at the 2:30 o'clock position in the other at the 10:30 o'clock position, both highly suspicious for breast carcinoma. 2. Several abnormal left axillary lymph nodes with thickened cortices. 3. No evidence of right breast malignancy.       01/18/2017 Pathology Results    Diagnosis 1. Breast, left, needle core biopsy, 2:30 o'clock - INVASIVE MAMMARY CARCINOMA - MAMMARY CARCINOMA IN-SITU - SEE COMMENT 2. Lymph node, needle/core biopsy, left axilla - NO CARCINOMA IDENTIFIED - SEE COMMENT 3. Breast, left, needle core biopsy, 10:30 o'clock - INVASIVE DUCTAL CARCINOMA - SEE COMMENT  1. PROGNOSTIC INDICATORS Results: IMMUNOHISTOCHEMICAL AND MORPHOMETRIC ANALYSIS PERFORMED MANUALLY Estrogen Receptor:  100%, POSITIVE, STRONG STAINING INTENSITY Progesterone Receptor: 60%, POSITIVE, STRONG STAINING  INTENSITY Proliferation Marker Ki67: 60%  1. FLUORESCENCE IN-SITU HYBRIDIZATION Results: HER2 - NEGATIVE RATIO OF HER2/CEP17 SIGNALS 1.28 AVERAGE HER2 COPY NUMBER PER CELL 2.75  3. PROGNOSTIC INDICATORS Results: IMMUNOHISTOCHEMICAL AND MORPHOMETRIC ANALYSIS PERFORMED MANUALLY Estrogen Receptor: 100%, POSITIVE, STRONG STAINING INTENSITY Progesterone Receptor: 50%, POSITIVE, STRONG STAINING INTENSITY Proliferation Marker Ki67: 70%  3. FLUORESCENCE IN-SITU HYBRIDIZATION Results: HER2 - **POSITIVE** RATIO OF HER2/CEP17 SIGNALS 2.80 AVERAGE HER2 COPY NUMBER PER CELL 6.15  Microscopic Comment 1. The biopsy material shows an infiltrative proliferation of cells with large vesicular nuclei with inconspicuous nucleoli, arranged linearly and in small clusters. Based on the biopsy, the carcinoma appears Nottingham grade 2 of 3 and measures 0.9 cm in greatest linear extent. E-cadherin and prognostic markers (ER/PR/ki-67/HER2-FISH)are pending and will be reported in an addendum. Dr. Lyndon Code reviewed the case and agrees with the above diagnosis.      02/10/2017 Initial Diagnosis    Carcinoma of upper-outer quadrant of left breast in female, estrogen receptor positive ([place])      02/19/2017 Imaging    Bone Scan whole Body 02/19/17 IMPRESSION: Negative for evidence of bony metastatic disease.      02/19/2017 Imaging    CT CAP W Contrast 02/19/17 IMPRESSION: 1. Small right middle lobe pulmonary nodules up to 0.8 cm, nonspecific. Non-contrast chest CT at 3-6 months is recommended. If the nodules are stable at time of repeat CT, then future CT at 18-24 months (from today's scan) is considered optional for low-risk patients, but is recommended for high-risk patients. This recommendation follows the consensus statement: Guidelines for Management of Incidental Pulmonary Nodules Detected on CT Images: From the Fleischner Society 2017; Radiology 2017; 284:228-243. 2. Subcentimeter hepatic lesions  likely cysts but incompletely characterized due to small size. Attention on follow-up imaging recommended. 3. 2.1 cm fusiform right common iliac artery aneurysm. Continued surveillance recommended. 4. Aortic Atherosclerosis (ICD10-I70.0) and Emphysema (ICD10-J43.9).      02/23/2017 Pathology Results    Diagnosis 1. Breast, simple mastectomy, Left Total - INVASIVE DUCTAL CARCINOMA, MULTIFOCAL, NOTTINGHAM GRADE 3/3 (5.3 CM, 3.5 CM) - INVASIVE CARCINOMA INVOLVES THE DERMIS - DUCTAL CARCINOMA IN SITU, INTERMEDIATE GRADE - HYALINIZED FIBROADENOMA - MARGINS UNINVOLVED BY CARCINOMA - NO CARCINOMA IDENTIFIED IN TWO LYMPH NODES (0/2) - SEE ONCOLOGY TABLE AND COMMENT BELOW 2. Lymph node, sentinel, biopsy, Left Axillary - NO CARCINOMA IDENTIFIED IN ONE LYMPH NODE (0/1) 3. Lymph node, sentinel, biopsy - NO CARCINOMA IDENTIFIED IN ONE LYMPH NODE (0/1) 4. Lymph node, sentinel, biopsy - NO CARCINOMA IDENTIFIED IN ONE LYMPH NODE (0/1) 5. Lymph node, sentinel, biopsy - NO CARCINOMA IDENTIFIED IN ONE LYMPH NODE (0/1) Microscopic Comment 1. BREAST, INVASIVE TUMOR Procedure: Simple mastectomy Laterality: Left Tumor Size: 5. 3 cm, 3.5 cm Histologic Type: Invasive carcinoma of no special type (ductal, not otherwise specified) Grade: Nottingham Grade 3 Tubular Differentiation: 3 Nuclear Pleomorphism: 3 Mitotic Count: 2 Ductal Carcinoma in Situ (DCIS): Present Extent of Tumor: Skin: Invasive carcinoma directly invades into the dermis or epidermis without skin ulceration Margins: Invasive carcinoma, distance from closest margin: 1.5 cm (posterior) DCIS, distance from closest margin: > 1 cm Regional Lymph Nodes: Number of Lymph Nodes Examined: 6 Number of Sentinel Lymph Nodes Examined: 4 Lymph Nodes with Macrometastases: 0 Lymph Nodes with Micrometastases: 0 Lymph Nodes with Isolated Tumor Cells: 0 Treatment effect: No known presurgical therapy Breast Prognostic Profile: See Also  ([id number]) Estrogen Receptor: Positive (100%, strong); Positive (100%, strong) Progesterone Receptor: Positive (  50%, strong); Positive (50%, strong) Her2: Positive (Ratio 2.80); Negative (Ratio 1.28) Ki-67: 70%; 60% Best tumor block for sendout testing: 1E Pathologic Stage Classification (pTNM, AJCC 8th Edition): Primary Tumor: mpT3 Regional Lymph Nodes: pN0 COMMENT: The two invasive carcinomas in the breast have slightly different morphologies. The larger lesion has a papillary and micropapillary pattern admixed with typical ductal carcinoma while the smaller lesion is more typical of a ductal lesion with linear arrays (E-cadherin performed on biopsy). There are foci within the larger lesion that are concerning for lymphovascular space invasion.       04/07/2017 -  Chemotherapy    weekly taxol and herceptin x12 weeks, 04/07/17-06/23/17. Then herceptin q3 weeks for total 1 year which was switched to Deltana on 07/14/17.      07/20/2017 -  Radiation Therapy    She started adjuvant radiation on 07/20/17 with Dr. Lisbeth Renshaw and plans to complete on 09/03/17.       08/24/2017 -  Chemotherapy    The patient had trastuzumab (HERCEPTIN) 525 mg in sodium chloride 0.9 % 250 mL chemo infusion, 8 mg/kg = 525 mg, Intravenous,  Once, 1 of 5 cycles Administration: 525 mg (09/02/2017)  for chemotherapy treatment.      CURRENT THERAPY:   -weekly taxol and herceptin x12 weeks, 04/07/17-06/23/17. Then herceptin q3 weeks for total 1 year which was switched to Kadcyla on 07/14/17. Will switch back to maintenance Herceptin on September 02, 2017   INTERVAL HISTORY: Ms. Sawatzky returns for follow up and herceptin as scheduled. She was last seen by [name] on 08/25/17 and treatment was held for neuropathy and leg edema. Her treatment plan was changed from [facility] to herceptin. She continues to have mild pedal/ankle edema. She continues to struggle with neuropathy, constant numbness to fingers and toes, although worse in her  feet. There is pain and hypersensitivity. She has balance issues, uses a walker at home. She delayed further PT due to radiation treatments. [name] increased gabapentin to 300 mg TID last week, but she has been taking double that, 600 mg TID, without improvement. Her activity is limited and she is somewhat debilitated by this. She has difficulty buttoning her clothes, etc. Her morale is low because of this. She continues radiation, will complete 8/2. She has significant skin burn and pain which she rates 10/10 today. She wonders if she can get breast cancer in her right breast.   REVIEW OF SYSTEMS:   Constitutional: Denies fevers, chills or abnormal weight loss Ears, nose, mouth, throat, and face: Denies mucositis or sore throat Respiratory: Denies cough, dyspnea or wheezes Cardiovascular: Denies palpitation, chest discomfort or lower extremity swelling Gastrointestinal:  Denies nausea, vomiting, diarrhea, heartburn or change in bowel habits (+) constipation, managed with bran and stool softener  Skin: Denies abnormal skin rashes (+) radiation skin burn to left chest/axilla  Lymphatics: Denies new lymphadenopathy or easy bruising Neurological:Denies numbness, tingling or new weaknesses (+) neuropathy to feet > fingers, with pain, hypersensitivity and functional limitations  Behavioral/Psych: Mood is stable, no new changes (+) low morale  All other systems were reviewed with the patient and are negative.  MEDICAL HISTORY:  Past Medical History:  Diagnosis Date  . Arthritis    "maybe a little in my left knee" (02/23/2017)  . Asthmatic bronchitis   . Cancer of left breast ([place])   . Coronary atherosclerosis    Minor nonobstructive CAD at cardiac catheterization 2009  . Dyslipidemia   . Femoral bruit 7/08   With normal ABIs  .  Hypertension   . Lower extremity edema    Chronic  . Pneumonia ~ 1950   in 8th grade  . Type II diabetes mellitus ([place])     SURGICAL HISTORY: Past Surgical  History:  Procedure Laterality Date  . APPENDECTOMY    . BREAST BIOPSY Left 02/2017  . CARDIAC CATHETERIZATION     "long time ago" (02/23/2017)  . MASTECTOMY COMPLETE / SIMPLE W/ SENTINEL NODE BIOPSY Left 02/23/2017   TOTAL MATECTOMY;  LEFT AXILLARY SENTINEL LYMPH NODE BIOPSY ERAS PATHWAY  . MASTECTOMY WITH RADIOACTIVE SEED GUIDED EXCISION AND AXILLARY SENTINEL LYMPH NODE BIOPSY Left 02/23/2017   Procedure: LEFT TOTAL MATECTOMY;  LEFT AXILLARY SENTINEL LYMPH NODE BIOPSY ERAS PATHWAY;  Surgeon: Excell Seltzer, MD;  Location: [place];  Service: General;  Laterality: Left;  . PILONIDAL CYST EXCISION    . PORTA CATH INSERTION  02/23/2017  . PORTACATH PLACEMENT N/A 02/23/2017   Procedure: INSERTION PORT-A-CATH;  Surgeon: Excell Seltzer, MD;  Location: [place];  Service: General;  Laterality: N/A;  . SHOULDER ARTHROSCOPY W/ ROTATOR CUFF REPAIR Right     I have reviewed the social history and family history with the patient and they are unchanged from previous note.  ALLERGIES:  has No Known Allergies.  MEDICATIONS:  Current Outpatient Medications  Medication Sig Dispense Refill  . amLODipine (NORVASC) 5 MG tablet Take 1 tablet (5 mg total) by mouth daily. 30 tablet 2  . aspirin 81 MG EC tablet Take 81 mg by mouth at bedtime.     . chlorthalidone (HYGROTON) 25 MG tablet Take 25 mg by mouth daily.     . Coenzyme Q10 (COQ10) 100 MG CAPS Take 100 mg by mouth daily.      Marland Kitchen gabapentin (NEURONTIN) 300 MG capsule Take 1 capsule (300 mg total) by mouth 3 (three) times daily. 60 capsule 0  . latanoprost (XALATAN) 0.005 % ophthalmic solution Place 1 drop into both eyes at bedtime.    . lidocaine-prilocaine (EMLA) cream APPLY CREAM TOPICALLY TO AFFECTED AREA ONCE  3  . losartan (COZAAR) 50 MG tablet Take 50 mg by mouth daily.    . metFORMIN (GLUCOPHAGE) 500 MG tablet Take 1,000 mg by mouth daily with breakfast.     . potassium chloride SA (K-DUR,KLOR-CON) 20 MEQ tablet Take 1 tablet (20 mEq total) by  mouth 2 (two) times daily. 60 tablet 1  . vitamin B-12 (CYANOCOBALAMIN) 1000 MCG tablet Take 1,000 mcg by mouth daily.    Marland Kitchen HYDROcodone-acetaminophen (NORCO/VICODIN) 5-325 MG tablet Take 1 tablet by mouth every 6 (six) hours as needed for moderate pain. 20 tablet 0   No current facility-administered medications for this visit.    Facility-Administered Medications Ordered in Other Visits  Medication Dose Route Frequency Provider Last Rate Last Dose  . sodium chloride flush (NS) 0.9 % injection 10 mL  10 mL Intracatheter PRN Truitt Merle, MD   10 mL at 09/02/17 1442    PHYSICAL EXAMINATION: ECOG PERFORMANCE STATUS: 2 - Symptomatic, <50% confined to bed  Vitals:   09/02/17 1039  BP: (!) 148/73  Pulse: 76  Resp: 17  Temp: 97.7 F (36.5 C)  SpO2: 100%   Filed Weights   09/02/17 1039  Weight: 143 lb 12.8 oz (65.2 kg)    GENERAL:alert, no distress, presents in wheelchair  SKIN:  See image below  EYES: sclera clear OROPHARYNX:no thrush or ulcers  LYMPH:  no palpable cervical, supraclavicular, or axillary lymphadenopathy LUNGS: clear to auscultation with normal breathing effort HEART:  regular rate & rhythm and no murmurs; trace bilateral pedal edema ABDOMEN:abdomen soft, non-tender and normal bowel sounds Musculoskeletal:no cyanosis of digits and no clubbing  NEURO: alert & oriented x 3 with fluent speech. Moderate vibratory deficit to feet per tuning fork exam  Breast exam: left s/p mastectomy with scattered areas of moist desquamation superimposed over hyperpigmentation to left chest wall/axilla. No palpable mass in right breast or axilla that I could appreciate PAC without erythema        LABORATORY DATA:  I have reviewed the data as listed CBC Latest Ref Rng & Units 09/02/2017 08/25/2017 08/04/2017  WBC 3.9 - 10.3 K/uL 2.3(L) 2.5(L) 2.5(L)  Hemoglobin 11.6 - 15.9 g/dL 11.2(L) 11.6 11.0(L)  Hematocrit 34.8 - 46.6 % 33.4(L) 33.6(L) 33.9(L)  Platelets 145 - 400 K/uL 213 224 284      CMP Latest Ref Rng & Units 09/02/2017 08/25/2017 08/04/2017  Glucose 70 - 99 mg/dL 104(H) 96 64(L)  BUN 8 - 23 mg/dL 7(L) 9 10  Creatinine 0.44 - 1.00 mg/dL 0.68 0.72 0.72  Sodium 135 - 145 mmol/L 128(L) 122(L) 127(L)  Potassium 3.5 - 5.1 mmol/L 3.3(L) 2.9(LL) 3.4(L)  Chloride 98 - 111 mmol/L 91(L) 84(L) 90(L)  CO2 22 - 32 mmol/L _0 Calcium 8.9 - 10.3 mg/dL 9.8 10.0 10.0  Total Protein 6.5 - 8.1 g/dL 7.2 7.5 6.9  Total Bilirubin 0.3 - 1.2 mg/dL 0.7 0.9 0.6  Alkaline Phos 38 - 126 U/L 51 54 60  AST 15 - 41 U/L _1 ALT 0 - 44 U/L _2 PATHOLOGY  LEFT SIMPLE MASTECTOMY WITH SNLB 02/23/17 per [name] Diagnosis 1. Breast, simple mastectomy, Left Total - INVASIVE DUCTAL CARCINOMA, MULTIFOCAL, NOTTINGHAM GRADE 3/3 (5.3 CM, 3.5 CM) - INVASIVE CARCINOMA INVOLVES THE DERMIS - DUCTAL CARCINOMA IN SITU, INTERMEDIATE GRADE - HYALINIZED FIBROADENOMA - MARGINS UNINVOLVED BY CARCINOMA - NO CARCINOMA IDENTIFIED IN TWO LYMPH NODES (0/2) - SEE ONCOLOGY TABLE AND COMMENT BELOW 2. Lymph node, sentinel, biopsy, Left Axillary - NO CARCINOMA IDENTIFIED IN ONE LYMPH NODE (0/1) 3. Lymph node, sentinel, biopsy - NO CARCINOMA IDENTIFIED IN ONE LYMPH NODE (0/1) 4. Lymph node, sentinel, biopsy - NO CARCINOMA IDENTIFIED IN ONE LYMPH NODE (0/1) 5. Lymph node, sentinel, biopsy - NO CARCINOMA IDENTIFIED IN ONE LYMPH NODE (0/1) Microscopic Comment 1. BREAST, INVASIVE TUMOR Procedure: Simple mastectomy Laterality: Left Tumor Size: 5. 3 cm, 3.5 cm Histologic Type: Invasive carcinoma of no special type (ductal, not otherwise specified) Grade: Nottingham Grade 3 Tubular Differentiation: 3 Nuclear Pleomorphism: 3 Mitotic Count: 2 Ductal Carcinoma in Situ (DCIS): Present Extent of Tumor: Skin: Invasive carcinoma directly invades into the dermis or epidermis without skin ulceration Margins: Invasive carcinoma, distance from closest margin: 1.5 cm (posterior) DCIS, distance from  closest margin: > 1 cm Regional Lymph Nodes: Number of Lymph Nodes Examined: 6 Number of Sentinel Lymph Nodes Examined: 4 Lymph Nodes with Macrometastases: 0 Lymph Nodes with Micrometastases: 0 Lymph Nodes with Isolated Tumor Cells: 0 Treatment effect: No known presurgical therapy Breast Prognostic Profile: See Also ([id number]) Estrogen Receptor: Positive (100%, strong); Positive (100%, strong) Progesterone Receptor: Positive (50%, strong); Positive (50%, strong) Her2: Positive (Ratio 2.80); Negative (Ratio 1.28) Ki-67: 70%; 60% Best tumor block for sendout testing: 1E Pathologic Stage Classification (pTNM, AJCC 8th Edition): Primary Tumor: mpT3 Regional Lymph Nodes: pN0 COMMENT: The two invasive carcinomas in the breast have slightly different morphologies. The larger lesion has a papillary and micropapillary pattern admixed with  typical ductal carcinoma while the smaller lesion is more typical of a ductal lesion with linear arrays (E-cadherin performed on biopsy). There are foci within the larger lesion that are concerning for lymphovascular space invasion.   02/17/17 ECHO: Study Conclusions - Left ventricle: The cavity size was normal. Wall thickness was increased in a pattern of mild LVH. Systolic function was normal. The estimated ejection fraction was in the range of 55% to 60%. Normal GLS at -20%. Wall motion was normal; there were no regional wall motion abnormalities. Doppler parameters are consistent with abnormal left ventricular relaxation (grade 1 diastolic dysfunction). The E/e&' ratio is between 8-15, suggesting indeterminate LV filling pressure. - Aortic valve: Sclerosis without stenosis. - Aorta: Mildly dilated aorta. Aortic root dimension: 39 mm (ED). Ascending aortic diameter: 38 mm (S). - Left atrium: The atrium was normal in size. - Inferior vena cava: The vessel was normal in size. The respirophasic diameter changes were in the normal  range (>= 50%), consistent with normal central venous pressure. Impressions: - Compared to a prior study in 2013, the LVEF is slightly lower but normal at 55-60%. There is mild LVH, normal GLS at -20%, grade 1 DD and indeterminate LV filling pressure. The aorta is mildly dilated at 3.8-3.9 cm.    RADIOGRAPHIC STUDIES: I have personally reviewed the radiological images as listed and agreed with the findings in the report. No results found.   ASSESSMENT & PLAN: Sherrill Buikema is an 84 y.o. female with past medical history positive for HTN, DM, CAD now with newly diagnosed left breast cancer  1.Multifocal invasive ductal carcinoma of the left breast of female;carcinoma of the upper-outer quadrant of left breast and female, ER/PR positive, HER-2 negative pT2N0M0, G2, Stage 1B; IDC of the upper-inner quadrant left breast, ER+/PR+/HER2+, pT3N0M0 G3, stage IB -Ms. Lythgoe appears stable. She completed 2 cycles of Kadcyla, treatment was held and changed to herceptin for worsening neuropathy and leg edema. LE edema has improved, but neuropathy persists. Labs adequate for treatment. She is ok to proceed with herceptin today. She will continue q3 weeks. Echo is up to date. Due again 10/2017. -f/u in 3 weeks with next cycle -I discussed adjuvant AI therapy with anastrozole to reduce her risk of distant recurrence. I reviewed side effects including hot flash, skin and vaginal dryness, metabolic changes ( increased blood glucose, cholesterol, weight, etc.), slightly in increased risk of cardiovascular disease, cataracts, muscular and joint discomfort, osteopenia and osteoporosis, etc, with her in great detail. She is interested, but will think about it. I gave her printed material today.  -given her extensive radiation skin toxicity will revisit this topic in 3 weeks when she returns for follow up.   2. HTN, CAD, DM  3. Hyponatremia, hypokalemia  -Improved. K 3.3 today; I recommend she  increase oral K to 1 tab TID x3 days then resume BID dosing   4. Peripheral neuropathy, G2-3, secondary to taxol, DM, and T-DM1 -Treatment was held 7/24, kadcyla changed back to herceptin; however peripheral neuropathy has worsened. The patient appears somewhat debilitated, and she has evidence of sensory deficit.  -I recommend she restart PT for balance issues related to neuropathy, and take B complex vitamin  -She is taking gabapentin 600 mg TID without improvement. I discussed with Dr. Renda Rolls RN who can see her tomorrow for evaluation. I will defer medication/management to him.  -I will provide her a handicap placard   5. Bilateral leg edema -improving    6. Radiation toxicity  -She has  significant radiation burn to her left chest wall and axilla with severe pain. I prescribed hydrcodone-acet 5-325 mg 1 tab q6 hours PRN    PLAN -Labs reviewed, proceed with herceptin today, continue q3 weeks -Complete radiation 8/2 -Refer to Dr. Mickeal Skinner for peripheral neuropathy  -B complex vitamin  -Rx: hydrocodone-acetaminophen PRN  -Restart PT for balance issues  -Increase K to 1 tab TID x3 days then resume BID dosing   Orders Placed This Encounter  Procedures  . Ambulatory referral to Oncology    Referral Priority:   Routine    Referral Type:   Consultation    Referral Reason:   Specialty Services Required    Referred to Provider:   Ventura Sellers, MD    Number of Visits Requested:   1   All questions were answered. The patient knows to call the clinic with any problems, questions or concerns. No barriers to learning was detected. I spent 20 minutes counseling the patient face to face. The total time spent in the appointment was 25 minutes and more than 50% was on counseling and review of test results     Alla Feeling, NP 09/02/17

## 2017-09-02 NOTE — Telephone Encounter (Signed)
No los 8/1

## 2017-09-02 NOTE — Patient Instructions (Signed)

## 2017-09-02 NOTE — Patient Instructions (Signed)
Cancer Center Discharge Instructions for Patients Receiving Chemotherapy  Today you received the following chemotherapy agents: Trastuzumab (Herceptin).  To help prevent nausea and vomiting after your treatment, we encourage you to take your nausea medication as prescribed. If you develop nausea and vomiting that is not controlled by your nausea medication, call the clinic.   BELOW ARE SYMPTOMS THAT SHOULD BE REPORTED IMMEDIATELY:  *FEVER GREATER THAN 100.5 F  *CHILLS WITH OR WITHOUT FEVER  NAUSEA AND VOMITING THAT IS NOT CONTROLLED WITH YOUR NAUSEA MEDICATION  *UNUSUAL SHORTNESS OF BREATH  *UNUSUAL BRUISING OR BLEEDING  TENDERNESS IN MOUTH AND THROAT WITH OR WITHOUT PRESENCE OF ULCERS  *URINARY PROBLEMS  *BOWEL PROBLEMS  UNUSUAL RASH Items with * indicate a potential emergency and should be followed up as soon as possible.  Feel free to call the clinic should you have any questions or concerns. The clinic phone number is [phone].  Please show the CHEMO ALERT CARD at check-in to the Emergency Department and triage nurse.   

## 2017-09-03 ENCOUNTER — Encounter: Payer: Self-pay | Admitting: Radiation Oncology

## 2017-09-03 ENCOUNTER — Telehealth: Payer: Self-pay | Admitting: Internal Medicine

## 2017-09-03 ENCOUNTER — Encounter: Payer: Self-pay | Admitting: Internal Medicine

## 2017-09-03 ENCOUNTER — Encounter: Payer: Medicare Other | Admitting: Physical Therapy

## 2017-09-03 ENCOUNTER — Ambulatory Visit
Admission: RE | Admit: 2017-09-03 | Discharge: 2017-09-03 | Disposition: A | Discharge status: Home / Self Care | Payer: Medicare Other | Source: Ambulatory Visit | Attending: Radiation Oncology | Admitting: Radiation Oncology

## 2017-09-03 ENCOUNTER — Inpatient Hospital Stay (HOSPITAL_BASED_OUTPATIENT_CLINIC_OR_DEPARTMENT_OTHER): Payer: Medicare Other | Admitting: Internal Medicine

## 2017-09-03 VITALS — BP 144/78 | HR 81 | Temp 97.8°F | Resp 18 | Ht 64.5 in | Wt 148.0 lb

## 2017-09-03 DIAGNOSIS — E871 Hypo-osmolality and hyponatremia: Secondary | ICD-10-CM

## 2017-09-03 DIAGNOSIS — Z79899 Other long term (current) drug therapy: Secondary | ICD-10-CM | POA: Diagnosis not present

## 2017-09-03 DIAGNOSIS — Z5111 Encounter for antineoplastic chemotherapy: Secondary | ICD-10-CM | POA: Diagnosis not present

## 2017-09-03 DIAGNOSIS — E876 Hypokalemia: Secondary | ICD-10-CM | POA: Diagnosis not present

## 2017-09-03 DIAGNOSIS — G629 Polyneuropathy, unspecified: Secondary | ICD-10-CM

## 2017-09-03 DIAGNOSIS — G620 Drug-induced polyneuropathy: Secondary | ICD-10-CM

## 2017-09-03 DIAGNOSIS — C50212 Malignant neoplasm of upper-inner quadrant of left female breast: Secondary | ICD-10-CM

## 2017-09-03 DIAGNOSIS — Z510 Encounter for antineoplastic radiation therapy: Secondary | ICD-10-CM | POA: Diagnosis not present

## 2017-09-03 DIAGNOSIS — Z923 Personal history of irradiation: Secondary | ICD-10-CM | POA: Diagnosis not present

## 2017-09-03 DIAGNOSIS — C50412 Malignant neoplasm of upper-outer quadrant of left female breast: Secondary | ICD-10-CM

## 2017-09-03 DIAGNOSIS — Z170 Estrogen receptor positive status [ER+]: Secondary | ICD-10-CM

## 2017-09-03 DIAGNOSIS — T451X5A Adverse effect of antineoplastic and immunosuppressive drugs, initial encounter: Principal | ICD-10-CM

## 2017-09-03 MED ORDER — AMITRIPTYLINE HCL 50 MG PO TABS: 50.0000 mg | ORAL_TABLET | Freq: Every day | ORAL | 3 refills | Status: DC

## 2017-09-03 MED ORDER — GABAPENTIN 300 MG PO CAPS: 900.0000 mg | ORAL_CAPSULE | Freq: Three times a day (TID) | ORAL | 3 refills | Status: DC

## 2017-09-03 NOTE — Telephone Encounter (Signed)
Appointments scheduled AVS/Calendar printed per 8/2 los °

## 2017-09-03 NOTE — Progress Notes (Signed)
[facility] [address], [place] [address] [phone]   New Patient Evaluation  Date of Service: 09/03/17 Patient Name: Penny Huynh Patient MRN: [id number] Patient DOB: 30-Nov-1933 Provider: Ventura Sellers, MD  Identifying Statement:  Penny Huynh is a 84 y.o. female with Chemotherapy-induced peripheral neuropathy ([place]) [G62.0, T45.1X5A] who presents for initial consultation and evaluation regarding cancer associated neurologic deficits.    Referring Provider: Manon Hilding, MD [place], [place] [address]  Oncologic History: Oncology History   Cancer Staging Carcinoma of upper-inner quadrant of left breast in female, estrogen receptor positive ([place]) Staging form: Breast, AJCC 8th Edition - Clinical stage from 01/18/2017: Stage IB (cT2, cN0, cM0, G3, ER: Positive, PR: Positive, HER2: Positive) - Signed by Truitt Merle, MD on 02/10/2017 - Pathologic stage from 02/23/2017: Stage IB (pT3, pN0, cM0, G3, ER: Positive, PR: Positive, HER2: Positive) - Signed by Alla Feeling, NP on 03/16/2017  Carcinoma of upper-outer quadrant of left breast in female, estrogen receptor positive ([place]) Staging form: Breast, AJCC 8th Edition - Clinical stage from 01/18/2017: Stage IB (cT2, cN0, cM0, G2, ER: Positive, PR: Positive, HER2: Negative) - Unsigned - Pathologic stage from 02/23/2017: Stage IB (pT2, pN0(sn), cM0, G3, ER: Positive, PR: Positive, HER2: Negative) - Signed by Alla Feeling, NP on 03/16/2017       Carcinoma of upper-outer quadrant of left breast in female, estrogen receptor positive ([place])   01/13/2017 Mammogram    FINDINGS: In the left breast, a spiculated mass lies in the upper outer quadrant. In the medial posterior left breast, there is a larger lobulated mass. These masses correspond to the palpable abnormalities. No other discrete left breast masses.  In the right breast, there are no discrete masses. There are  no areas of architectural distortion.  In both breasts there are scattered calcifications, rounded punctate, with a few other larger coarse dystrophic calcifications, without significant change from the previous screening mammogram, which is dated 12/04/2009.  Mammographic images were processed with CAD.  On physical exam, there is a firm palpable mass in the upper outer left breast and another firm mass in the medial left breast.       01/13/2017 Breast US    Targeted ultrasound is performed, showing an irregular hypoechoic shadowing mass in the left breast at the 2:30 o'clock position, 5 cm the nipple, measuring 3.3 x 2.8 x 2.6 cm. In the 10:30 o'clock position of the left breast, 5 cm the nipple, there is irregular hypoechoic mass with somewhat more lobulated margins, corresponding to the lobulated mass seen mammographically. This measures 4.8 x 2.9 x 4.8 cm. Both masses show internal vascularity on color Doppler analysis.  Sonographic evaluation of the left axilla shows several nodes with thickened cortices. Status cortex measured is 5 mm. None of these lymph nodes appear enlarged but overall size criteria.  IMPRESSION: 1. Two masses in the left breast, 1 at the 2:30 o'clock position in the other at the 10:30 o'clock position, both highly suspicious for breast carcinoma. 2. Several abnormal left axillary lymph nodes with thickened cortices. 3. No evidence of right breast malignancy.       01/18/2017 Pathology Results    Diagnosis 1. Breast, left, needle core biopsy, 2:30 o'clock - INVASIVE MAMMARY CARCINOMA - MAMMARY CARCINOMA IN-SITU - SEE COMMENT 2. Lymph node, needle/core biopsy, left axilla - NO CARCINOMA IDENTIFIED - SEE COMMENT 3. Breast, left, needle core biopsy, 10:30 o'clock -  INVASIVE DUCTAL CARCINOMA - SEE COMMENT  1. PROGNOSTIC INDICATORS Results: IMMUNOHISTOCHEMICAL AND MORPHOMETRIC ANALYSIS PERFORMED MANUALLY Estrogen Receptor: 100%,  POSITIVE, STRONG STAINING INTENSITY Progesterone Receptor: 60%, POSITIVE, STRONG STAINING INTENSITY Proliferation Marker Ki67: 60%  1. FLUORESCENCE IN-SITU HYBRIDIZATION Results: HER2 - NEGATIVE RATIO OF HER2/CEP17 SIGNALS 1.28 AVERAGE HER2 COPY NUMBER PER CELL 2.75  3. PROGNOSTIC INDICATORS Results: IMMUNOHISTOCHEMICAL AND MORPHOMETRIC ANALYSIS PERFORMED MANUALLY Estrogen Receptor: 100%, POSITIVE, STRONG STAINING INTENSITY Progesterone Receptor: 50%, POSITIVE, STRONG STAINING INTENSITY Proliferation Marker Ki67: 70%  3. FLUORESCENCE IN-SITU HYBRIDIZATION Results: HER2 - **POSITIVE** RATIO OF HER2/CEP17 SIGNALS 2.80 AVERAGE HER2 COPY NUMBER PER CELL 6.15  Microscopic Comment 1. The biopsy material shows an infiltrative proliferation of cells with large vesicular nuclei with inconspicuous nucleoli, arranged linearly and in small clusters. Based on the biopsy, the carcinoma appears Nottingham grade 2 of 3 and measures 0.9 cm in greatest linear extent. E-cadherin and prognostic markers (ER/PR/ki-67/HER2-FISH)are pending and will be reported in an addendum. Dr. Lyndon Code reviewed the case and agrees with the above diagnosis.      02/10/2017 Initial Diagnosis    Carcinoma of upper-outer quadrant of left breast in female, estrogen receptor positive ([place])      02/19/2017 Imaging    Bone Scan whole Body 02/19/17 IMPRESSION: Negative for evidence of bony metastatic disease.      02/19/2017 Imaging    CT CAP W Contrast 02/19/17 IMPRESSION: 1. Small right middle lobe pulmonary nodules up to 0.8 cm, nonspecific. Non-contrast chest CT at 3-6 months is recommended. If the nodules are stable at time of repeat CT, then future CT at 18-24 months (from today's scan) is considered optional for low-risk patients, but is recommended for high-risk patients. This recommendation follows the consensus statement: Guidelines for Management of Incidental Pulmonary Nodules Detected on CT Images: From the  Fleischner Society 2017; Radiology 2017; 284:228-243. 2. Subcentimeter hepatic lesions likely cysts but incompletely characterized due to small size. Attention on follow-up imaging recommended. 3. 2.1 cm fusiform right common iliac artery aneurysm. Continued surveillance recommended. 4. Aortic Atherosclerosis (ICD10-I70.0) and Emphysema (ICD10-J43.9).      02/23/2017 Pathology Results    Diagnosis 1. Breast, simple mastectomy, Left Total - INVASIVE DUCTAL CARCINOMA, MULTIFOCAL, NOTTINGHAM GRADE 3/3 (5.3 CM, 3.5 CM) - INVASIVE CARCINOMA INVOLVES THE DERMIS - DUCTAL CARCINOMA IN SITU, INTERMEDIATE GRADE - HYALINIZED FIBROADENOMA - MARGINS UNINVOLVED BY CARCINOMA - NO CARCINOMA IDENTIFIED IN TWO LYMPH NODES (0/2) - SEE ONCOLOGY TABLE AND COMMENT BELOW 2. Lymph node, sentinel, biopsy, Left Axillary - NO CARCINOMA IDENTIFIED IN ONE LYMPH NODE (0/1) 3. Lymph node, sentinel, biopsy - NO CARCINOMA IDENTIFIED IN ONE LYMPH NODE (0/1) 4. Lymph node, sentinel, biopsy - NO CARCINOMA IDENTIFIED IN ONE LYMPH NODE (0/1) 5. Lymph node, sentinel, biopsy - NO CARCINOMA IDENTIFIED IN ONE LYMPH NODE (0/1) Microscopic Comment 1. BREAST, INVASIVE TUMOR Procedure: Simple mastectomy Laterality: Left Tumor Size: 5. 3 cm, 3.5 cm Histologic Type: Invasive carcinoma of no special type (ductal, not otherwise specified) Grade: Nottingham Grade 3 Tubular Differentiation: 3 Nuclear Pleomorphism: 3 Mitotic Count: 2 Ductal Carcinoma in Situ (DCIS): Present Extent of Tumor: Skin: Invasive carcinoma directly invades into the dermis or epidermis without skin ulceration Margins: Invasive carcinoma, distance from closest margin: 1.5 cm (posterior) DCIS, distance from closest margin: > 1 cm Regional Lymph Nodes: Number of Lymph Nodes Examined: 6 Number of Sentinel Lymph Nodes Examined: 4 Lymph Nodes with Macrometastases: 0 Lymph Nodes with Micrometastases: 0 Lymph Nodes with Isolated Tumor Cells: 0 Treatment  effect: No known  presurgical therapy Breast Prognostic Profile: See Also ([id number]) Estrogen Receptor: Positive (100%, strong); Positive (100%, strong) Progesterone Receptor: Positive (50%, strong); Positive (50%, strong) Her2: Positive (Ratio 2.80); Negative (Ratio 1.28) Ki-67: 70%; 60% Best tumor block for sendout testing: 1E Pathologic Stage Classification (pTNM, AJCC 8th Edition): Primary Tumor: mpT3 Regional Lymph Nodes: pN0 COMMENT: The two invasive carcinomas in the breast have slightly different morphologies. The larger lesion has a papillary and micropapillary pattern admixed with typical ductal carcinoma while the smaller lesion is more typical of a ductal lesion with linear arrays (E-cadherin performed on biopsy). There are foci within the larger lesion that are concerning for lymphovascular space invasion.       04/07/2017 -  Chemotherapy    weekly taxol and herceptin x12 weeks, 04/07/17-06/23/17. Then herceptin q3 weeks for total 1 year which was switched to [place] on 07/14/17.      07/20/2017 -  Radiation Therapy    She started adjuvant radiation on 07/20/17 with Dr. Lisbeth Renshaw and plans to complete on 09/03/17.       08/24/2017 -  Chemotherapy    The patient had trastuzumab (HERCEPTIN) 525 mg in sodium chloride 0.9 % 250 mL chemo infusion, 8 mg/kg = 525 mg, Intravenous,  Once, 1 of 5 cycles Administration: 525 mg (09/02/2017)  for chemotherapy treatment.        History of Present Illness: The patient's records from the referring physician were obtained and reviewed and the patient interviewed to confirm this HPI.  Penny Huynh presents today to discuss her neuropathy symptoms.  She describes painful burning affecting her feet and lower to mid-shins symmetrically.  This started sometime after taxol chemotherapy was started in March (continued through May) and has persisted or even progressed.  At this time she also describes feeling of numbness and tingling affecting her  finger tips on both hands.  Her walking is impaired because of poor sensation and balance, she is requiring a walker fora ambulation.  There is also noted swelling of both feet.  She does acknowledge a milder version of these symptoms, affecting the bottoms of her feet only, which were present prior to the onset of chemotherapy.  She has long-standing diagnosis of diabetes.  Presently she is receiving Herceptin and no further cytotoxic chemotherapy.  Taking Gabapentin 625m 3x day without efficacy.  Medications: Current Outpatient Medications on File Prior to Visit  Medication Sig Dispense Refill  . amLODipine (NORVASC) 5 MG tablet Take 1 tablet (5 mg total) by mouth daily. 30 tablet 2  . aspirin 81 MG EC tablet Take 81 mg by mouth at bedtime.     . chlorthalidone (HYGROTON) 25 MG tablet Take 25 mg by mouth daily.     . Coenzyme Q10 (COQ10) 100 MG CAPS Take 100 mg by mouth daily.      .Marland Kitchengabapentin (NEURONTIN) 300 MG capsule Take 1 capsule (300 mg total) by mouth 3 (three) times daily. 60 capsule 0  . HYDROcodone-acetaminophen (NORCO/VICODIN) 5-325 MG tablet Take 1 tablet by mouth every 6 (six) hours as needed for moderate pain. 20 tablet 0  . latanoprost (XALATAN) 0.005 % ophthalmic solution Place 1 drop into both eyes at bedtime.    . lidocaine-prilocaine (EMLA) cream APPLY CREAM TOPICALLY TO AFFECTED AREA ONCE  3  . losartan (COZAAR) 50 MG tablet Take 50 mg by mouth daily.    . metFORMIN (GLUCOPHAGE) 500 MG tablet Take 1,000 mg by mouth daily with breakfast.     . potassium chloride SA (K-DUR,KLOR-CON)  20 MEQ tablet Take 1 tablet (20 mEq total) by mouth 2 (two) times daily. 60 tablet 1  . vitamin B-12 (CYANOCOBALAMIN) 1000 MCG tablet Take 1,000 mcg by mouth daily.    . [DISCONTINUED] prochlorperazine (COMPAZINE) 10 MG tablet Take 1 tablet (10 mg total) by mouth every 6 (six) hours as needed (Nausea or vomiting). 30 tablet 1   No current facility-administered medications on file prior to visit.       Allergies: No Known Allergies Past Medical History:  Past Medical History:  Diagnosis Date  . Arthritis    "maybe a little in my left knee" (02/23/2017)  . Asthmatic bronchitis   . Cancer of left breast ([place])   . Coronary atherosclerosis    Minor nonobstructive CAD at cardiac catheterization 2009  . Dyslipidemia   . Femoral bruit 7/08   With normal ABIs  . Hypertension   . Lower extremity edema    Chronic  . Pneumonia ~ 1950   in 8th grade  . Type II diabetes mellitus ([place])    Past Surgical History:  Past Surgical History:  Procedure Laterality Date  . APPENDECTOMY    . BREAST BIOPSY Left 02/2017  . CARDIAC CATHETERIZATION     "long time ago" (02/23/2017)  . MASTECTOMY COMPLETE / SIMPLE W/ SENTINEL NODE BIOPSY Left 02/23/2017   TOTAL MATECTOMY;  LEFT AXILLARY SENTINEL LYMPH NODE BIOPSY ERAS PATHWAY  . MASTECTOMY WITH RADIOACTIVE SEED GUIDED EXCISION AND AXILLARY SENTINEL LYMPH NODE BIOPSY Left 02/23/2017   Procedure: LEFT TOTAL MATECTOMY;  LEFT AXILLARY SENTINEL LYMPH NODE BIOPSY ERAS PATHWAY;  Surgeon: Excell Seltzer, MD;  Location: [facility];  Service: General;  Laterality: Left;  . PILONIDAL CYST EXCISION    . PORTA CATH INSERTION  02/23/2017  . PORTACATH PLACEMENT N/A 02/23/2017   Procedure: INSERTION PORT-A-CATH;  Surgeon: Excell Seltzer, MD;  Location: [facility];  Service: General;  Laterality: N/A;  . SHOULDER ARTHROSCOPY W/ ROTATOR CUFF REPAIR Right    Social History:  Social History   Socioeconomic History  . Marital status: Married    Spouse name: Not on file  . Number of children: 2  . Years of education: Not on file  . Highest education level: Not on file  Occupational History  . Occupation: Counselling psychologist for [employer]    Comment: only works during tax season but not currently   . Occupation: in Dentist   . Occupation: retired Mining engineer of education     Comment: [employer]  . Financial resource strain: Not on file  . Food insecurity:    Worry:  Not on file    Inability: Not on file  . Transportation needs:    Medical: Not on file    Non-medical: Not on file  Tobacco Use  . Smoking status: Former Smoker    Packs/day: 2.00    Years: 46.00    Pack years: 92.00    Types: Cigarettes    Start date: 11/09/1953    Last attempt to quit: 2000    Years since quitting: 19.5  . Smokeless tobacco: Never Used  Substance and Sexual Activity  . Alcohol use: Yes    Alcohol/week: 1.2 oz    Types: 2 Glasses of wine per week  . Drug use: No  . Sexual activity: Not on file  Lifestyle  . Physical activity:    Days per week: Not on file    Minutes per session: Not on file  . Stress: Not on file  Relationships  . Social connections:    Talks on phone: Not on file    Gets together: Not on file    Attends religious service: Not on file    Active member of club or organization: Not on file    Attends meetings of clubs or organizations: Not on file    Relationship status: Not on file  . Intimate partner violence:    Fear of current or ex partner: Not on file    Emotionally abused: Not on file    Physically abused: Not on file    Forced sexual activity: Not on file  Other Topics Concern  . Not on file  Social History Narrative   Lives in [place] with her husband.   Has 2 grown children.   Family History:  Family History  Problem Relation Age of Onset  . Diabetes Mother   . Stroke Father   . Cancer Father        unknown type     Review of Systems: Constitutional: Denies fevers, chills or abnormal weight loss Eyes: Denies blurriness of vision Ears, nose, mouth, throat, and face: Denies mucositis or sore throat Respiratory: Denies cough, dyspnea or wheezes Cardiovascular: Denies palpitation, chest discomfort or lower extremity swelling Gastrointestinal:  Denies nausea, constipation, diarrhea GU: Denies dysuria or incontinence Skin: Denies abnormal skin rashes Neurological: Per HPI Musculoskeletal: +knee pain Behavioral/Psych:  Denies anxiety, disturbance in thought content, and mood instability   Physical Exam: Vitals:   09/03/17 1100  BP: (!) 144/78  Pulse: 81  Resp: 18  Temp: 97.8 F (36.6 C)  SpO2: 100%   KPS: 60. General: Alert, cooperative, pleasant, in no acute distress Head: Craniotomy scar noted, dry and intact. EENT: No conjunctival injection or scleral icterus. Oral mucosa moist Lungs: Resp effort normal Cardiac: Regular rate and rhythm Abdomen: Soft, non-distended abdomen Skin: No rashes cyanosis or petechiae. Extremities: No clubbing or edema  Neurologic Exam: Mental Status: Awake, alert, attentive to examiner. Oriented to self and environment. Language is fluent with intact comprehension.  Cranial Nerves: Visual acuity is grossly normal. Visual fields are full. Extra-ocular movements intact. No ptosis. Face is symmetric, tongue midline. Motor: Tone and bulk are normal. Power is full in both arms and legs. Reflexes are decreased bilaterally, no pathologic reflexes present. Intact finger to nose bilaterally Sensory: Impaired to light touch and temp lower legs and fingertips Gait: Deferred   Labs: I have reviewed the data as listed    Component Value Date/Time   NA 128 (L) 09/02/2017 0926   K 3.3 (L) 09/02/2017 0926   CL 91 (L) 09/02/2017 0926   CO2 28 09/02/2017 0926   GLUCOSE 104 (H) 09/02/2017 0926   BUN 7 (L) 09/02/2017 0926   CREATININE 0.68 09/02/2017 0926   CALCIUM 9.8 09/02/2017 0926   PROT 7.2 09/02/2017 0926   ALBUMIN 3.8 09/02/2017 0926   AST 19 09/02/2017 0926   ALT 15 09/02/2017 0926   ALKPHOS 51 09/02/2017 0926   BILITOT 0.7 09/02/2017 0926   GFRNONAA >60 09/02/2017 0926   GFRAA >60 09/02/2017 0926   Lab Results  Component Value Date   WBC 2.3 (L) 09/02/2017   NEUTROABS 1.5 09/02/2017   HGB 11.2 (L) 09/02/2017   HCT 33.4 (L) 09/02/2017   MCV 88.7 09/02/2017   PLT 213 09/02/2017      Assessment/Plan 1. Chemotherapy-induced peripheral neuropathy  [facility])  Penny Huynh has a symmetric distal polyneuropathy which is secondary to taxol exposure combined with likely pre-existing diabetic  neuropathy.  There is evidence of both small and large fiber involvement.  No significant motor impairment at this time is appreciated.  Inciting agent has been discontinued.  We explained to the patient at this time the goal is to manage symptoms.  Neurontin has been ineffective at 185m/day.  We explained that absorption of Neurontin is not linear with the dose and varies from person to person, with some individuals requiring 36079mdaily for efficacy.  We recommended increasing Gabapentin to 90054mID.  We also recommended adding a second neuropathic pain agent, Amytryptilline 61m48m.  This may help with her sleep which has been disrupted by symptoms.  Counseled on potential side effects of fatigue, dry mouth, constipation.  She should return to clinic in 3-4 weeks for further dose titration of the agents.  Also could consider capsaicin or lidocaine patches in future.   We spent ten additional minutes teaching regarding the natural history, biology, and historical experience in the treatment of neurologic complications of cancer. We also provided teaching sheets for the patient to take home as an additional resource.  We appreciate the opportunity to participate in the care of Penny BongAll questions were answered. The patient knows to call the clinic with any problems, questions or concerns. No barriers to learning were detected.  The total time spent in the encounter was 25 minutes and more than 50% was on counseling and review of test results   ZachVentura Sellers Medical Director of Neuro-Oncology [facility] WorthWeslMacungie02/19 11:10 AM

## 2017-09-08 NOTE — Progress Notes (Signed)
  Radiation Oncology         [phone]) [phone] ________________________________  Name: Penny Huynh MRN: [id number]  Date: 09/03/2017  DOB: [birth year]  End of Treatment Note  Diagnosis:   84 y.o. female with Multifocal Stage IB, pT2N0M0 grade 3 ER/PR positive invasive ductal carcinoma, and Stage IB, pT3N0M0 grade 3 triple positive invasive ductal carcinoma of the left breast   Indication for treatment:  Curative       Radiation treatment dates:   07/20/2017 - 09/03/2017  Site/dose:   The patient initially received a dose of 50.4 Gy in 28 fractions to the left chest wall and left supraclavicular region. This was delivered using a 3-D conformal, 4 field technique. The patient then received a boost to the mastectomy scar. This delivered an additional 10 Gy in 5 fractions using an en face electron field. The total dose was 60.4 Gy.  Narrative: The patient tolerated radiation treatment relatively well.   The patient had some expected skin irritation as she progressed during treatment. Moist desquamation was not present at the end of treatment.  Plan: The patient has completed radiation treatment. The patient will return to radiation oncology clinic for routine followup in one month. I advised the patient to call or return sooner if they have any questions or concerns related to their recovery or treatment. ________________________________  Jodelle Gross, MD, PhD  This document serves as a record of services personally performed by Kyung Rudd, MD. It was created on his behalf by Rae Lips, a trained medical scribe. The creation of this record is based on the scribe's personal observations and the provider's statements to them. This document has been checked and approved by the attending provider.

## 2017-09-15 ENCOUNTER — Other Ambulatory Visit: Payer: Medicare Other

## 2017-09-15 ENCOUNTER — Ambulatory Visit: Payer: Medicare Other | Admitting: Hematology

## 2017-09-15 ENCOUNTER — Ambulatory Visit: Payer: Medicare Other

## 2017-09-17 ENCOUNTER — Telehealth: Payer: Self-pay | Admitting: Hematology

## 2017-09-17 ENCOUNTER — Other Ambulatory Visit: Payer: Self-pay | Admitting: Hematology

## 2017-09-17 DIAGNOSIS — T300 Burn of unspecified body region, unspecified degree: Secondary | ICD-10-CM

## 2017-09-17 MED ORDER — HYDROCODONE-ACETAMINOPHEN 5-325 MG PO TABS: 1.0000 | ORAL_TABLET | Freq: Four times a day (QID) | ORAL | 0 refills | Status: DC | PRN

## 2017-09-17 NOTE — Telephone Encounter (Signed)
Called patient back per MD she will see her 8/22

## 2017-09-20 NOTE — Progress Notes (Signed)
[facility]  [phone] [phone]  Clinic Follow up Note   Patient Care Team: Sasser, Silvestre Moment, MD as PCP - General (Cardiology) Penny Commons, MD as PCP - Cardiology (Cardiology) Truitt Merle, MD as Consulting Physician (Hematology) Excell Seltzer, MD as Consulting Physician (General Surgery) Alla Feeling, NP as Nurse Practitioner (Nurse Practitioner) Penny Commons, MD as Attending Physician (Cardiology)   Date of Service:  09/23/2017  SUMMARY OF ONCOLOGIC HISTORY: Cancer Staging Carcinoma of upper-inner quadrant of left breast in female, estrogen receptor positive ([place]) Staging form: Breast, AJCC 8th Edition - Clinical stage from 01/18/2017: Stage IB (cT2, cN0, cM0, G3, ER: Positive, PR: Positive, HER2: Positive) - Signed by Truitt Merle, MD on 02/10/2017 - Pathologic stage from 02/23/2017: Stage IB (pT3, pN0, cM0, G3, ER: Positive, PR: Positive, HER2: Positive) - Signed by Alla Feeling, NP on 03/16/2017  Carcinoma of upper-outer quadrant of left breast in female, estrogen receptor positive ([place]) Staging form: Breast, AJCC 8th Edition - Clinical stage from 01/18/2017: Stage IB (cT2, cN0, cM0, G2, ER: Positive, PR: Positive, HER2: Negative) - Unsigned - Pathologic stage from 02/23/2017: Stage IB (pT2, pN0(sn), cM0, G3, ER: Positive, PR: Positive, HER2: Negative) - Signed by Alla Feeling, NP on 03/16/2017  Oncology History   Cancer Staging Carcinoma of upper-inner quadrant of left breast in female, estrogen receptor positive ([facility]) Staging form: Breast, AJCC 8th Edition - Clinical stage from 01/18/2017: Stage IB (cT2, cN0, cM0, G3, ER: Positive, PR: Positive, HER2: Positive) - Signed by Truitt Merle, MD on 02/10/2017 - Pathologic stage from 02/23/2017: Stage IB (pT3, pN0, cM0, G3, ER: Positive, PR: Positive, HER2: Positive) - Signed by Alla Feeling, NP on 03/16/2017  Carcinoma of upper-outer quadrant of left breast in female, estrogen  receptor positive ([facility]) Staging form: Breast, AJCC 8th Edition - Clinical stage from 01/18/2017: Stage IB (cT2, cN0, cM0, G2, ER: Positive, PR: Positive, HER2: Negative) - Unsigned - Pathologic stage from 02/23/2017: Stage IB (pT2, pN0(sn), cM0, G3, ER: Positive, PR: Positive, HER2: Negative) - Signed by Alla Feeling, NP on 03/16/2017       Carcinoma of upper-outer quadrant of left breast in female, estrogen receptor positive ([facility])   01/13/2017 Mammogram    FINDINGS: In the left breast, a spiculated mass lies in the upper outer quadrant. In the medial posterior left breast, there is a larger lobulated mass. These masses correspond to the palpable abnormalities. No other discrete left breast masses.  In the right breast, there are no discrete masses. There are no areas of architectural distortion.  In both breasts there are scattered calcifications, rounded punctate, with a few other larger coarse dystrophic calcifications, without significant change from the previous screening mammogram, which is dated 12/04/2009.  Mammographic images were processed with CAD.  On physical exam, there is a firm palpable mass in the upper outer left breast and another firm mass in the medial left breast.     01/13/2017 Breast US    Targeted ultrasound is performed, showing an irregular hypoechoic shadowing mass in the left breast at the 2:30 o'clock position, 5 cm the nipple, measuring 3.3 x 2.8 x 2.6 cm. In the 10:30 o'clock position of the left breast, 5 cm the nipple, there is irregular hypoechoic mass with somewhat more lobulated margins, corresponding to the lobulated mass seen mammographically. This measures 4.8 x 2.9 x 4.8 cm. Both masses show internal vascularity on color Doppler analysis.  Sonographic evaluation of the left axilla shows  several nodes with thickened cortices. Status cortex measured is 5 mm. None of these lymph nodes appear enlarged but overall size  criteria.  IMPRESSION: 1. Two masses in the left breast, 1 at the 2:30 o'clock position in the other at the 10:30 o'clock position, both highly suspicious for breast carcinoma. 2. Several abnormal left axillary lymph nodes with thickened cortices. 3. No evidence of right breast malignancy.     01/18/2017 Pathology Results    Diagnosis 1. Breast, left, needle core biopsy, 2:30 o'clock - INVASIVE MAMMARY CARCINOMA - MAMMARY CARCINOMA IN-SITU - SEE COMMENT 2. Lymph node, needle/core biopsy, left axilla - NO CARCINOMA IDENTIFIED - SEE COMMENT 3. Breast, left, needle core biopsy, 10:30 o'clock - INVASIVE DUCTAL CARCINOMA - SEE COMMENT  1. PROGNOSTIC INDICATORS Results: IMMUNOHISTOCHEMICAL AND MORPHOMETRIC ANALYSIS PERFORMED MANUALLY Estrogen Receptor: 100%, POSITIVE, STRONG STAINING INTENSITY Progesterone Receptor: 60%, POSITIVE, STRONG STAINING INTENSITY Proliferation Marker Ki67: 60%  1. FLUORESCENCE IN-SITU HYBRIDIZATION Results: HER2 - NEGATIVE RATIO OF HER2/CEP17 SIGNALS 1.28 AVERAGE HER2 COPY NUMBER PER CELL 2.75  3. PROGNOSTIC INDICATORS Results: IMMUNOHISTOCHEMICAL AND MORPHOMETRIC ANALYSIS PERFORMED MANUALLY Estrogen Receptor: 100%, POSITIVE, STRONG STAINING INTENSITY Progesterone Receptor: 50%, POSITIVE, STRONG STAINING INTENSITY Proliferation Marker Ki67: 70%  3. FLUORESCENCE IN-SITU HYBRIDIZATION Results: HER2 - **POSITIVE** RATIO OF HER2/CEP17 SIGNALS 2.80 AVERAGE HER2 COPY NUMBER PER CELL 6.15  Microscopic Comment 1. The biopsy material shows an infiltrative proliferation of cells with large vesicular nuclei with inconspicuous nucleoli, arranged linearly and in small clusters. Based on the biopsy, the carcinoma appears Nottingham grade 2 of 3 and measures 0.9 cm in greatest linear extent. E-cadherin and prognostic markers (ER/PR/ki-67/HER2-FISH)are pending and will be reported in an addendum. Dr. Lyndon Code reviewed the case and agrees with the above  diagnosis.    02/10/2017 Initial Diagnosis    Carcinoma of upper-outer quadrant of left breast in female, estrogen receptor positive ([facility])    02/19/2017 Imaging    Bone Scan whole Body 02/19/17 IMPRESSION: Negative for evidence of bony metastatic disease.    02/19/2017 Imaging    CT CAP W Contrast 02/19/17 IMPRESSION: 1. Small right middle lobe pulmonary nodules up to 0.8 cm, nonspecific. Non-contrast chest CT at 3-6 months is recommended. If the nodules are stable at time of repeat CT, then future CT at 18-24 months (from today's scan) is considered optional for low-risk patients, but is recommended for high-risk patients. This recommendation follows the consensus statement: Guidelines for Management of Incidental Pulmonary Nodules Detected on CT Images: From the Fleischner Society 2017; Radiology 2017; 284:228-243. 2. Subcentimeter hepatic lesions likely cysts but incompletely characterized due to small size. Attention on follow-up imaging recommended. 3. 2.1 cm fusiform right common iliac artery aneurysm. Continued surveillance recommended. 4. Aortic Atherosclerosis (ICD10-I70.0) and Emphysema (ICD10-J43.9).    02/23/2017 Pathology Results    Diagnosis 1. Breast, simple mastectomy, Left Total - INVASIVE DUCTAL CARCINOMA, MULTIFOCAL, NOTTINGHAM GRADE 3/3 (5.3 CM, 3.5 CM) - INVASIVE CARCINOMA INVOLVES THE DERMIS - DUCTAL CARCINOMA IN SITU, INTERMEDIATE GRADE - HYALINIZED FIBROADENOMA - MARGINS UNINVOLVED BY CARCINOMA - NO CARCINOMA IDENTIFIED IN TWO LYMPH NODES (0/2) - SEE ONCOLOGY TABLE AND COMMENT BELOW 2. Lymph node, sentinel, biopsy, Left Axillary - NO CARCINOMA IDENTIFIED IN ONE LYMPH NODE (0/1) 3. Lymph node, sentinel, biopsy - NO CARCINOMA IDENTIFIED IN ONE LYMPH NODE (0/1) 4. Lymph node, sentinel, biopsy - NO CARCINOMA IDENTIFIED IN ONE LYMPH NODE (0/1) 5. Lymph node, sentinel, biopsy - NO CARCINOMA IDENTIFIED IN ONE LYMPH NODE (0/1) Microscopic Comment 1. BREAST,  INVASIVE TUMOR Procedure:  Simple mastectomy Laterality: Left Tumor Size: 5. 3 cm, 3.5 cm Histologic Type: Invasive carcinoma of no special type (ductal, not otherwise specified) Grade: Nottingham Grade 3 Tubular Differentiation: 3 Nuclear Pleomorphism: 3 Mitotic Count: 2 Ductal Carcinoma in Situ (DCIS): Present Extent of Tumor: Skin: Invasive carcinoma directly invades into the dermis or epidermis without skin ulceration Margins: Invasive carcinoma, distance from closest margin: 1.5 cm (posterior) DCIS, distance from closest margin: > 1 cm Regional Lymph Nodes: Number of Lymph Nodes Examined: 6 Number of Sentinel Lymph Nodes Examined: 4 Lymph Nodes with Macrometastases: 0 Lymph Nodes with Micrometastases: 0 Lymph Nodes with Isolated Tumor Cells: 0 Treatment effect: No known presurgical therapy Breast Prognostic Profile: See Also ([id number]) Estrogen Receptor: Positive (100%, strong); Positive (100%, strong) Progesterone Receptor: Positive (50%, strong); Positive (50%, strong) Her2: Positive (Ratio 2.80); Negative (Ratio 1.28) Ki-67: 70%; 60% Best tumor block for sendout testing: 1E Pathologic Stage Classification (pTNM, AJCC 8th Edition): Primary Tumor: mpT3 Regional Lymph Nodes: pN0 COMMENT: The two invasive carcinomas in the breast have slightly different morphologies. The larger lesion has a papillary and micropapillary pattern admixed with typical ductal carcinoma while the smaller lesion is more typical of a ductal lesion with linear arrays (E-cadherin performed on biopsy). There are foci within the larger lesion that are concerning for lymphovascular space invasion.     04/07/2017 -  Chemotherapy    weekly taxol and herceptin x12 weeks, 04/07/17-06/23/17. Then herceptin q3 weeks for total 1 year which was switched to [place] on 07/14/17.    07/20/2017 - 09/03/2017 Radiation Therapy    She started adjuvant radiation on 07/20/17 with Dr. Lisbeth Renshaw and plans to complete on 09/03/17.      08/24/2017 -  Chemotherapy    maintenance Herceptin    HISTORY OF PRESENTING ILLNESS:  [name] 84 y.o. female is here because of the diagnosed left breast cancer, she presents with her husband.  The patient self palpated a left breast mass around 6 months ago, she thought it was calcium deposit as her mother had a history of this.  There was no pain at first but has become intermittently tender which prompted her to seek medical attention with her PCP.  She reports the palpable mass nearly doubled in that time period. No associated breast swelling or nipple discharge.  She reports a decreased appetite lately, denies fatigue or weight loss.  Diagnostic imaging shows an irregular hypoechoic mass in the left breast at the 2:30 o'clock position, 5 cm from the nipple, measuring 3.3 x 2.8 x 2.6 cm.  In the 10:30 o'clock position of the left breast, 5 cm from the nipple, there is irregular hypoechoic mass measuring 4.8 x 2.9 x 4.8 cm.  Ultrasound of the left axilla shows cortical thickening but no enlarged lymph nodes.  Left breast mass at 2:30 o'clock position is positive for invasive mammary carcinoma with mammary carcinoma in situ, ER/PR positive, HER-2 negative; left breast mass at 10:30 o'clock position is positive for invasive ductal carcinoma, ER/PR positive, HER-2 positive.  The biopsied lymph node was negative for carcinoma.  She was then referred to [name] for surgical consult.  Today she reports feeling well with intermittent productive nagging cough with clear phlegm; no wheezing or dyspnea.  Has occasional burning in hands and feet but not affecting function, she attributes to long-term diabetes.  Denies breast or general pain, as well.  Denies palpitations, chest pain, or lower extremity edema.  In the past she has history of knee pain, hypertension,  hyperlipidemia, CAD, and DM.  In the 1960s she was told she had an enlarged heart but was never told she had a diagnosis of heart  failure, no MI or stroke.  She is followed by cardiologist Dr. Bronson Ing. Last echo in 2013 with EF 60-65% and grade I diastolic dysfunction.   She is retired from [employer] but has been recently enrolled in college courses to further her education. She previously exercised 6 days per week but decreased exercise over the last 6 months. She also works at [employer] during tax season. Lives at home with her spouse. She is independent of all ADLs, drives. She drinks alcohol occasionally; former smoker who quit in 2001, previously smoked 2 PPD x46 years.   GYN HISTORY  Menarchal: age [age] LMP: age 63 Contraceptive: none HRT: none GP: G2P0, has 2 grown (foster) children  CURRENT THERAPY:   -weekly taxol and herceptin x12 weeks, 04/07/17-06/23/17. Then herceptin q3 weeks for total 1 year which was switched to [place] on 07/14/17, and switched back to maintenance Herceptin on September 02, 2017 due to worsening peripheral neuropathy.   INTERVAL HISTORY:  KAYDE ATKERSON returns for follow up She completed radiation with Dr. Lisbeth Renshaw on 09/03/2017. She is here with her husband. She is feeling better. Her neuropathy is improving. She ambulates with a cane. She still wakes up at night from the neuropathy. The tip of her fingers are numb, but she tries to carry out daily activities with minimal limitations.      REVIEW OF SYSTEMS:   Constitutional: Denies fatigue, fevers, chills or abnormal weight loss (+) Fatigue (+) weight loss  Eyes: Denies blurriness of vision Ears, nose, mouth, throat, and face: Denies mucositis or sore throat  Respiratory: Denies cough, dyspnea or wheezes Cardiovascular: Denies palpitation, chest discomfort (+)B/l  lower extremity swelling of feet Gastrointestinal:  Denies nausea, vomiting, diarrhea, heartburn  Skin: Denies rashes (+) diffuse bumps over left chest  Lymphatics: Denies new lymphadenopathy or easy bruising MSK: (+) left knee pain from worsening  neuropathy  Neurological: Denies new weaknesses  (+) peripheral neuropathy in her hands and feet, worsening up her leg, improving  Behavioral/Psych: Mood is stable, no new changes  All other systems were reviewed with the patient and are negative.  MEDICAL HISTORY:  Past Medical History:  Diagnosis Date  . Arthritis    "maybe a little in my left knee" (02/23/2017)  . Asthmatic bronchitis   . Cancer of left breast ([place])   . Coronary atherosclerosis    Minor nonobstructive CAD at cardiac catheterization 2009  . Dyslipidemia   . Femoral bruit 7/08   With normal ABIs  . Hypertension   . Lower extremity edema    Chronic  . Pneumonia ~ 1950   in 8th grade  . Type II diabetes mellitus ([place])     SURGICAL HISTORY: Past Surgical History:  Procedure Laterality Date  . APPENDECTOMY    . BREAST BIOPSY Left 02/2017  . CARDIAC CATHETERIZATION     "long time ago" (02/23/2017)  . MASTECTOMY COMPLETE / SIMPLE W/ SENTINEL NODE BIOPSY Left 02/23/2017   TOTAL MATECTOMY;  LEFT AXILLARY SENTINEL LYMPH NODE BIOPSY ERAS PATHWAY  . MASTECTOMY WITH RADIOACTIVE SEED GUIDED EXCISION AND AXILLARY SENTINEL LYMPH NODE BIOPSY Left 02/23/2017   Procedure: LEFT TOTAL MATECTOMY;  LEFT AXILLARY SENTINEL LYMPH NODE BIOPSY ERAS PATHWAY;  Surgeon: Excell Seltzer, MD;  Location: [facility];  Service: General;  Laterality: Left;  . PILONIDAL CYST EXCISION    .  PORTA CATH INSERTION  02/23/2017  . PORTACATH PLACEMENT N/A 02/23/2017   Procedure: INSERTION PORT-A-CATH;  Surgeon: Excell Seltzer, MD;  Location: [facility];  Service: General;  Laterality: N/A;  . SHOULDER ARTHROSCOPY W/ ROTATOR CUFF REPAIR Right     I have reviewed the social history and family history with the patient and they are unchanged from previous note.  ALLERGIES:  has No Known Allergies.  MEDICATIONS:  Current Outpatient Medications  Medication Sig Dispense Refill  . amitriptyline (ELAVIL) 50 MG tablet Take 1 tablet (50 mg total) by mouth at  bedtime. 30 tablet 3  . amLODipine (NORVASC) 5 MG tablet Take 1 tablet (5 mg total) by mouth daily. 30 tablet 2  . aspirin 81 MG EC tablet Take 81 mg by mouth at bedtime.     . chlorthalidone (HYGROTON) 25 MG tablet Take 25 mg by mouth daily.     . Coenzyme Q10 (COQ10) 100 MG CAPS Take 100 mg by mouth daily.      Marland Kitchen gabapentin (NEURONTIN) 300 MG capsule Take 3 capsules (900 mg total) by mouth 3 (three) times daily. 120 capsule 3  . HYDROcodone-acetaminophen (NORCO/VICODIN) 5-325 MG tablet Take 1 tablet by mouth every 6 (six) hours as needed for moderate pain. 40 tablet 0  . latanoprost (XALATAN) 0.005 % ophthalmic solution Place 1 drop into both eyes at bedtime.    . lidocaine-prilocaine (EMLA) cream APPLY CREAM TOPICALLY TO AFFECTED AREA ONCE  3  . losartan (COZAAR) 50 MG tablet Take 50 mg by mouth daily.    . metFORMIN (GLUCOPHAGE) 500 MG tablet Take 1,000 mg by mouth daily with breakfast.     . potassium chloride SA (K-DUR,KLOR-CON) 20 MEQ tablet Take 1 tablet (20 mEq total) by mouth 2 (two) times daily. 60 tablet 1  . vitamin B-12 (CYANOCOBALAMIN) 1000 MCG tablet Take 1,000 mcg by mouth daily.     No current facility-administered medications for this visit.     PHYSICAL EXAMINATION:  ECOG PERFORMANCE STATUS: 3  Vitals:   09/23/17 0932  BP: (!) 142/70  Pulse: 82  Resp: 18  Temp: 98.1 F (36.7 C)  SpO2: 98%   Filed Weights   09/23/17 0932  Weight: 150 lb 6.4 oz (68.2 kg)    GENERAL:alert, no distress and comfortable, in a wheelchair  SKIN: skin color, texture, turgor are normal  (+) mild bumps over left chest from radiation EYES: normal, Conjunctiva are pink and non-injected, sclera clear OROPHARYNX:no exudate, no erythema and lips, buccal mucosa, and tongue normal  NECK: supple, thyroid normal size, non-tender, without nodularity LYMPH:  no palpable cervical, supraclavicular, or axillary lymphadenopathy  LUNGS: clear to auscultation bilaterally with normal breathing  effort HEART: regular rate & rhythm and no murmurs (+) B/l lower extremity edema of feet ABDOMEN:abdomen soft, non-tender and normal bowel sounds Musculoskeletal:no cyanosis of digits and no clubbing  NEURO: alert & oriented x 3 with fluent speech, no focal motor/sensory deficits BREASTS: s/p left mastectomy, surgical incision healing well PAC without erythema   LABORATORY DATA:  I have reviewed the data as listed CBC Latest Ref Rng & Units 09/23/2017 09/02/2017 08/25/2017  WBC 3.9 - 10.3 K/uL 2.9(L) 2.3(L) 2.5(L)  Hemoglobin 11.6 - 15.9 g/dL 10.9(L) 11.2(L) 11.6  Hematocrit 34.8 - 46.6 % 33.1(L) 33.4(L) 33.6(L)  Platelets 145 - 400 K/uL 244 213 224     CMP Latest Ref Rng & Units 09/23/2017 09/02/2017 08/25/2017  Glucose 70 - 99 mg/dL 103(H) 104(H) 96  BUN 8 - 23 mg/dL  10 7(L) 9  Creatinine 0.44 - 1.00 mg/dL 0.73 0.68 0.72  Sodium 135 - 145 mmol/L 130(L) 128(L) 122(L)  Potassium 3.5 - 5.1 mmol/L 3.8 3.3(L) 2.9(LL)  Chloride 98 - 111 mmol/L 94(L) 91(L) 84(L)  CO2 22 - 32 mmol/L _0 Calcium 8.9 - 10.3 mg/dL 9.7 9.8 10.0  Total Protein 6.5 - 8.1 g/dL 7.1 7.2 7.5  Total Bilirubin 0.3 - 1.2 mg/dL 0.5 0.7 0.9  Alkaline Phos 38 - 126 U/L 63 51 54  AST 15 - 41 U/L _1 ALT 0 - 44 U/L _2 ANC 1.8K today   PATHOLOGY  LEFT SIMPLE MASTECTOMY WITH SNLB 02/23/17 per [name] Diagnosis 1. Breast, simple mastectomy, Left Total - INVASIVE DUCTAL CARCINOMA, MULTIFOCAL, NOTTINGHAM GRADE 3/3 (5.3 CM, 3.5 CM) - INVASIVE CARCINOMA INVOLVES THE DERMIS - DUCTAL CARCINOMA IN SITU, INTERMEDIATE GRADE - HYALINIZED FIBROADENOMA - MARGINS UNINVOLVED BY CARCINOMA - NO CARCINOMA IDENTIFIED IN TWO LYMPH NODES (0/2) - SEE ONCOLOGY TABLE AND COMMENT BELOW 2. Lymph node, sentinel, biopsy, Left Axillary - NO CARCINOMA IDENTIFIED IN ONE LYMPH NODE (0/1) 3. Lymph node, sentinel, biopsy - NO CARCINOMA IDENTIFIED IN ONE LYMPH NODE (0/1) 4. Lymph node, sentinel, biopsy - NO CARCINOMA  IDENTIFIED IN ONE LYMPH NODE (0/1) 5. Lymph node, sentinel, biopsy - NO CARCINOMA IDENTIFIED IN ONE LYMPH NODE (0/1) Microscopic Comment 1. BREAST, INVASIVE TUMOR Procedure: Simple mastectomy Laterality: Left Tumor Size: 5. 3 cm, 3.5 cm Histologic Type: Invasive carcinoma of no special type (ductal, not otherwise specified) Grade: Nottingham Grade 3 Tubular Differentiation: 3 Nuclear Pleomorphism: 3 Mitotic Count: 2 Ductal Carcinoma in Situ (DCIS): Present Extent of Tumor: Skin: Invasive carcinoma directly invades into the dermis or epidermis without skin ulceration Margins: Invasive carcinoma, distance from closest margin: 1.5 cm (posterior) DCIS, distance from closest margin: > 1 cm Regional Lymph Nodes: Number of Lymph Nodes Examined: 6 Number of Sentinel Lymph Nodes Examined: 4 Lymph Nodes with Macrometastases: 0 Lymph Nodes with Micrometastases: 0 Lymph Nodes with Isolated Tumor Cells: 0 Treatment effect: No known presurgical therapy Breast Prognostic Profile: See Also ([id number]) Estrogen Receptor: Positive (100%, strong); Positive (100%, strong) Progesterone Receptor: Positive (50%, strong); Positive (50%, strong) Her2: Positive (Ratio 2.80); Negative (Ratio 1.28) Ki-67: 70%; 60% Best tumor block for sendout testing: 1E Pathologic Stage Classification (pTNM, AJCC 8th Edition): Primary Tumor: mpT3 Regional Lymph Nodes: pN0 COMMENT: The two invasive carcinomas in the breast have slightly different morphologies. The larger lesion has a papillary and micropapillary pattern admixed with typical ductal carcinoma while the smaller lesion is more typical of a ductal lesion with linear arrays (E-cadherin performed on biopsy). There are foci within the larger lesion that are concerning for lymphovascular space invasion.   02/17/17 ECHO: Study Conclusions - Left ventricle: The cavity size was normal. Wall thickness was   increased in a pattern of mild LVH. Systolic function  was normal.   The estimated ejection fraction was in the range of 55% to 60%.   Normal GLS at -20%. Wall motion was normal; there were no   regional wall motion abnormalities. Doppler parameters are   consistent with abnormal left ventricular relaxation (grade 1   diastolic dysfunction). The E/e&' ratio is between 8-15,   suggesting indeterminate LV filling pressure. - Aortic valve: Sclerosis without stenosis. - Aorta: Mildly dilated aorta. Aortic root dimension: 39 mm (ED).   Ascending aortic diameter: 38 mm (S). - Left atrium: The atrium was normal in size. -  Inferior vena cava: The vessel was normal in size. The   respirophasic diameter changes were in the normal range (>= 50%),   consistent with normal central venous pressure. Impressions: - Compared to a prior study in 2013, the LVEF is slightly lower but   normal at 55-60%. There is mild LVH, normal GLS at -20%, grade 1   DD and indeterminate LV filling pressure. The aorta is mildly   dilated at 3.8-3.9 cm.   RADIOGRAPHIC STUDIES: I have personally reviewed the radiological images as listed and agreed with the findings in the report.   Bone Scan whole Body 02/19/17 IMPRESSION: Negative for evidence of bony metastatic disease.   CT CAP W Contrast 02/19/17 IMPRESSION: 1. Small right middle lobe pulmonary nodules up to 0.8 cm, nonspecific. Non-contrast chest CT at 3-6 months is recommended. If the nodules are stable at time of repeat CT, then future CT at 18-24 months (from today's scan) is considered optional for low-risk patients, but is recommended for high-risk patients. This recommendation follows the consensus statement: Guidelines for Management of Incidental Pulmonary Nodules Detected on CT Images: From the Fleischner Society 2017; Radiology 2017; 284:228-243. 2. Subcentimeter hepatic lesions likely cysts but incompletely characterized due to small size. Attention on follow-up imaging recommended. 3. 2.1 cm  fusiform right common iliac artery aneurysm. Continued surveillance recommended. 4. Aortic Atherosclerosis (ICD10-I70.0) and Emphysema (ICD10-J43.9).   ASSESSMENT & PLAN:  Roshanna Cimino is an 84 y.o. female with past medical history positive for HTN, DM, CAD now with newly diagnosed left breast cancer  1.  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   -We previously reviewed her pathology results in detail, she had multifocal invasive ductal carcinoma, G3, the upper inner quadrant focus spanning 5.3 cm, ER+/PR+/HER2+; the upper outer quadrant focus spanned 3.5 cm, ER+/PR+/HER2- node-negative.  -We previously discussed the high risk of recurrence due to HER-2 positive T3 disease, I previously recommended her to consider adjuvant chemotherapy. Due to her advanced age, I recommended weekly Taxol and Herceptin for 12 weeks, followed by Herceptin maintenance therapy for a total of 1 year. She began this regimen on 04/07/17. She tolerated treatment well overall. She has completed Taxol therapy on 06/23/17.  -based on recent adjuvant T-DM1 data, I previously recommended her to switch Herceptin maintenance to Kadcyla maintenance, to complete 1 year therapy.  She agreed and switched on 07/14/17.  Unfortunately she developed worsening peripheral neuropathy, which could be partially related to T-DM1, so I switched back to Herceptin on 09/02/2017 -She started adjuvant radiation on 07/20/17 with Dr. Lisbeth Renshaw and completed on 09/03/17, tolerated well  -Her peripheral neuropathy has improved recently, she is able to ambulate with a cane -I recommend her to  start adjuvant anastrazole.  -The potential benefit and side effects, which includes but not limited to, hot flash, skin and vaginal dryness, metabolic changes ( increased blood glucose, cholesterol, weight, etc.), slightly in increased risk of cardiovascular disease, cataracts, muscular and joint discomfort, osteopenia and osteoporosis, etc, were discussed with her in great details. She is interested, and will start in Sep  -Follow-up in 3 weeks with Lacie   2. HTN, CAD, DM -She  is on amlodipine, chlorthalidone, and losartan and metformin BID for DM -We previously reviewed DM can worsen while on chemotherapy especially with steroid administration; will monitor closely.  -She will continue to monitor BG at home and see PCP for any needed medication adjustments. DM is well controlled now.  -Her 07/08/17 ECHO with EF of 55% -Continue follow-up of his primary care physician, and cardiologist   3. Hyponatremia, Hypokalemia  -She is not on SSRI.   -On chlorthalidone diuretic for HTN.  -Encouraged to monitor her BP. She is asymptomatic; will monitor closely.  -Hyperkalemia has resolved, her sodium is still low overall stable.  4. 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 -I previously iced her hands and feet during chemo and taxol dose was reduced accordingly and has completed.  -She started Gabapentin 100 mg in 08/2017. -Her neuropathy has worsened, especially at night and has progressed up her legs.  -She has been on B12 supplement, I recommend she to switch to B complex.  -She ambulates with cane and walker and has had to stop PT for now.  -I filled her Handicap sticker previously. -We recently referred her to see neurologist Dr. Mickeal Skinner, she is currently on high-dose gabapentin and amitriptyline. Her neuropathy is improving.  -I advised  her to attend PT for her neuropathy, referral was made today   PLAN: - PT referral for peripheral neuropathy -Follow-up with Dr. Mickeal Skinner today -Lab reviewed, will proceed Herceptin today, and continue every 3 weeks. -Follow-up in 3 weeks -I will start her on Anastrozole in 1-2 weeks    All questions were answered. The patient knows to call the clinic with any problems, questions or concerns. No barriers to learning was detected.  I spent 25 minutes counseling the patient face to face. The total time spent in the appointment was 30 minutes and more than 50% was on counseling and review of test results  I, Noor Dweik am acting as scribe for Dr. Truitt Merle.  I have reviewed the above documentation for accuracy and completeness, and I agree with the above.   Truitt Merle, MD 09/23/2017

## 2017-09-23 ENCOUNTER — Inpatient Hospital Stay: Payer: Medicare Other

## 2017-09-23 ENCOUNTER — Encounter: Payer: Self-pay | Admitting: Internal Medicine

## 2017-09-23 ENCOUNTER — Inpatient Hospital Stay (HOSPITAL_BASED_OUTPATIENT_CLINIC_OR_DEPARTMENT_OTHER): Payer: Medicare Other | Admitting: Internal Medicine

## 2017-09-23 ENCOUNTER — Encounter: Payer: Self-pay | Admitting: Hematology

## 2017-09-23 ENCOUNTER — Inpatient Hospital Stay (HOSPITAL_BASED_OUTPATIENT_CLINIC_OR_DEPARTMENT_OTHER): Payer: Medicare Other | Admitting: Hematology

## 2017-09-23 ENCOUNTER — Other Ambulatory Visit: Payer: Self-pay

## 2017-09-23 VITALS — BP 142/70 | HR 82 | Temp 98.1°F | Resp 18 | Ht 64.5 in | Wt 150.4 lb

## 2017-09-23 DIAGNOSIS — C50412 Malignant neoplasm of upper-outer quadrant of left female breast: Secondary | ICD-10-CM

## 2017-09-23 DIAGNOSIS — C50212 Malignant neoplasm of upper-inner quadrant of left female breast: Secondary | ICD-10-CM

## 2017-09-23 DIAGNOSIS — Z9012 Acquired absence of left breast and nipple: Secondary | ICD-10-CM

## 2017-09-23 DIAGNOSIS — Z79899 Other long term (current) drug therapy: Secondary | ICD-10-CM | POA: Diagnosis not present

## 2017-09-23 DIAGNOSIS — E876 Hypokalemia: Secondary | ICD-10-CM

## 2017-09-23 DIAGNOSIS — G629 Polyneuropathy, unspecified: Secondary | ICD-10-CM | POA: Diagnosis not present

## 2017-09-23 DIAGNOSIS — E871 Hypo-osmolality and hyponatremia: Secondary | ICD-10-CM

## 2017-09-23 DIAGNOSIS — Z95828 Presence of other vascular implants and grafts: Secondary | ICD-10-CM

## 2017-09-23 DIAGNOSIS — Z923 Personal history of irradiation: Secondary | ICD-10-CM | POA: Diagnosis not present

## 2017-09-23 DIAGNOSIS — Z170 Estrogen receptor positive status [ER+]: Secondary | ICD-10-CM | POA: Diagnosis not present

## 2017-09-23 DIAGNOSIS — T451X5A Adverse effect of antineoplastic and immunosuppressive drugs, initial encounter: Principal | ICD-10-CM

## 2017-09-23 DIAGNOSIS — Z5111 Encounter for antineoplastic chemotherapy: Secondary | ICD-10-CM | POA: Diagnosis not present

## 2017-09-23 DIAGNOSIS — G620 Drug-induced polyneuropathy: Secondary | ICD-10-CM

## 2017-09-23 LAB — CBC WITH DIFFERENTIAL (CANCER CENTER ONLY)
BASOS ABS: 0 10*3/uL (ref 0.0–0.1)
BASOS PCT: 1 %
EOS ABS: 0.1 10*3/uL (ref 0.0–0.5)
EOS PCT: 4 %
HCT: 33.1 % — ABNORMAL LOW (ref 34.8–46.6)
Hemoglobin: 10.9 g/dL — ABNORMAL LOW (ref 11.6–15.9)
Lymphocytes Relative: 18 %
Lymphs Abs: 0.5 10*3/uL — ABNORMAL LOW (ref 0.9–3.3)
MCH: 29.1 pg (ref 25.1–34.0)
MCHC: 32.9 g/dL (ref 31.5–36.0)
MCV: 88.4 fL (ref 79.5–101.0)
Monocytes Absolute: 0.5 10*3/uL (ref 0.1–0.9)
Monocytes Relative: 16 %
Neutro Abs: 1.8 10*3/uL (ref 1.5–6.5)
Neutrophils Relative %: 61 %
PLATELETS: 244 10*3/uL (ref 145–400)
RBC: 3.75 MIL/uL (ref 3.70–5.45)
RDW: 14.3 % (ref 11.2–14.5)
WBC: 2.9 10*3/uL — AB (ref 3.9–10.3)

## 2017-09-23 LAB — CMP (CANCER CENTER ONLY)
ALT: 22 U/L (ref 0–44)
AST: 24 U/L (ref 15–41)
Albumin: 3.3 g/dL — ABNORMAL LOW (ref 3.5–5.0)
Alkaline Phosphatase: 63 U/L (ref 38–126)
Anion gap: 8 (ref 5–15)
BILIRUBIN TOTAL: 0.5 mg/dL (ref 0.3–1.2)
BUN: 10 mg/dL (ref 8–23)
CALCIUM: 9.7 mg/dL (ref 8.9–10.3)
CO2: 28 mmol/L (ref 22–32)
CREATININE: 0.73 mg/dL (ref 0.44–1.00)
Chloride: 94 mmol/L — ABNORMAL LOW (ref 98–111)
GFR, Est AFR Am: >60 mL/min (ref >60)
Glucose, Bld: 103 mg/dL — ABNORMAL HIGH (ref 70–99)
Potassium: 3.8 mmol/L (ref 3.5–5.1)
Sodium: 130 mmol/L — ABNORMAL LOW (ref 135–145)
Total Protein: 7.1 g/dL (ref 6.5–8.1)

## 2017-09-23 MED ORDER — ACETAMINOPHEN 325 MG PO TABS
ORAL_TABLET | ORAL | Status: AC
  Filled 2017-09-23: qty 2

## 2017-09-23 MED ORDER — ANASTROZOLE 1 MG PO TABS: 1.0000 mg | ORAL_TABLET | Freq: Every day | ORAL | 1 refills | Status: DC

## 2017-09-23 MED ORDER — TRASTUZUMAB CHEMO 150 MG IV SOLR
6.0000 mg/kg | Freq: Once | INTRAVENOUS | Status: AC
  Administered 2017-09-23: 378 mg via INTRAVENOUS
  Filled 2017-09-23: qty 18

## 2017-09-23 MED ORDER — SODIUM CHLORIDE 0.9% FLUSH
10.0000 mL | INTRAVENOUS | Status: DC | PRN
  Administered 2017-09-23: 10 mL
  Filled 2017-09-23: qty 10

## 2017-09-23 MED ORDER — DIPHENHYDRAMINE HCL 25 MG PO CAPS
ORAL_CAPSULE | ORAL | Status: AC
  Filled 2017-09-23: qty 2

## 2017-09-23 MED ORDER — DIPHENHYDRAMINE HCL 25 MG PO CAPS
50.0000 mg | ORAL_CAPSULE | Freq: Once | ORAL | Status: AC
  Administered 2017-09-23: 50 mg via ORAL

## 2017-09-23 MED ORDER — SODIUM CHLORIDE 0.9 % IV SOLN
Freq: Once | INTRAVENOUS | Status: AC
  Administered 2017-09-23: 11:00:00 via INTRAVENOUS
  Filled 2017-09-23: qty 250

## 2017-09-23 MED ORDER — ACETAMINOPHEN 325 MG PO TABS
650.0000 mg | ORAL_TABLET | Freq: Once | ORAL | Status: AC
  Administered 2017-09-23: 650 mg via ORAL

## 2017-09-23 MED ORDER — HEPARIN SOD (PORK) LOCK FLUSH 100 UNIT/ML IV SOLN
500.0000 [IU] | Freq: Once | INTRAVENOUS | Status: AC | PRN
  Administered 2017-09-23: 500 [IU]
  Filled 2017-09-23: qty 5

## 2017-09-23 NOTE — Progress Notes (Signed)
[facility] [place], [place] [address] [phone]   Interval Evaluation  Date of Service: 09/23/17 Patient Name: Penny Huynh Patient MRN: [id number] Patient DOB: [birth year] Provider: Ventura Sellers, MD  Identifying Statement:  Penny Huynh is a 84 y.o. female with Chemotherapy-induced peripheral neuropathy ([place]) [G62.0, T45.1X5A]   Oncologic History: Oncology History   Cancer Staging Carcinoma of upper-inner quadrant of left breast in female, estrogen receptor positive ([place]) Staging form: Breast, AJCC 8th Edition - Clinical stage from 01/18/2017: Stage IB (cT2, cN0, cM0, G3, ER: Positive, PR: Positive, HER2: Positive) - Signed by Truitt Merle, MD on 02/10/2017 - Pathologic stage from 02/23/2017: Stage IB (pT3, pN0, cM0, G3, ER: Positive, PR: Positive, HER2: Positive) - Signed by Alla Feeling, NP on 03/16/2017  Carcinoma of upper-outer quadrant of left breast in female, estrogen receptor positive ([place]) Staging form: Breast, AJCC 8th Edition - Clinical stage from 01/18/2017: Stage IB (cT2, cN0, cM0, G2, ER: Positive, PR: Positive, HER2: Negative) - Unsigned - Pathologic stage from 02/23/2017: Stage IB (pT2, pN0(sn), cM0, G3, ER: Positive, PR: Positive, HER2: Negative) - Signed by Alla Feeling, NP on 03/16/2017       Carcinoma of upper-outer quadrant of left breast in female, estrogen receptor positive ([place])   01/13/2017 Mammogram    FINDINGS: In the left breast, a spiculated mass lies in the upper outer quadrant. In the medial posterior left breast, there is a larger lobulated mass. These masses correspond to the palpable abnormalities. No other discrete left breast masses.  In the right breast, there are no discrete masses. There are no areas of architectural distortion.  In both breasts there are scattered calcifications, rounded punctate, with a few other larger coarse dystrophic calcifications, without  significant change from the previous screening mammogram, which is dated 12/04/2009.  Mammographic images were processed with CAD.  On physical exam, there is a firm palpable mass in the upper outer left breast and another firm mass in the medial left breast.     01/13/2017 Breast US    Targeted ultrasound is performed, showing an irregular hypoechoic shadowing mass in the left breast at the 2:30 o'clock position, 5 cm the nipple, measuring 3.3 x 2.8 x 2.6 cm. In the 10:30 o'clock position of the left breast, 5 cm the nipple, there is irregular hypoechoic mass with somewhat more lobulated margins, corresponding to the lobulated mass seen mammographically. This measures 4.8 x 2.9 x 4.8 cm. Both masses show internal vascularity on color Doppler analysis.  Sonographic evaluation of the left axilla shows several nodes with thickened cortices. Status cortex measured is 5 mm. None of these lymph nodes appear enlarged but overall size criteria.  IMPRESSION: 1. Two masses in the left breast, 1 at the 2:30 o'clock position in the other at the 10:30 o'clock position, both highly suspicious for breast carcinoma. 2. Several abnormal left axillary lymph nodes with thickened cortices. 3. No evidence of right breast malignancy.     01/18/2017 Pathology Results    Diagnosis 1. Breast, left, needle core biopsy, 2:30 o'clock - INVASIVE MAMMARY CARCINOMA - MAMMARY CARCINOMA IN-SITU - SEE COMMENT 2. Lymph node, needle/core biopsy, left axilla - NO CARCINOMA IDENTIFIED - SEE COMMENT 3. Breast, left, needle core biopsy, 10:30 o'clock - INVASIVE DUCTAL CARCINOMA - SEE COMMENT  1. PROGNOSTIC INDICATORS Results: IMMUNOHISTOCHEMICAL AND MORPHOMETRIC ANALYSIS PERFORMED MANUALLY Estrogen Receptor: 100%, POSITIVE, STRONG STAINING INTENSITY Progesterone Receptor: 60%, POSITIVE, STRONG STAINING INTENSITY Proliferation  Marker Ki67: 60%  1. FLUORESCENCE IN-SITU HYBRIDIZATION Results: HER2  - NEGATIVE RATIO OF HER2/CEP17 SIGNALS 1.28 AVERAGE HER2 COPY NUMBER PER CELL 2.75  3. PROGNOSTIC INDICATORS Results: IMMUNOHISTOCHEMICAL AND MORPHOMETRIC ANALYSIS PERFORMED MANUALLY Estrogen Receptor: 100%, POSITIVE, STRONG STAINING INTENSITY Progesterone Receptor: 50%, POSITIVE, STRONG STAINING INTENSITY Proliferation Marker Ki67: 70%  3. FLUORESCENCE IN-SITU HYBRIDIZATION Results: HER2 - **POSITIVE** RATIO OF HER2/CEP17 SIGNALS 2.80 AVERAGE HER2 COPY NUMBER PER CELL 6.15  Microscopic Comment 1. The biopsy material shows an infiltrative proliferation of cells with large vesicular nuclei with inconspicuous nucleoli, arranged linearly and in small clusters. Based on the biopsy, the carcinoma appears Nottingham grade 2 of 3 and measures 0.9 cm in greatest linear extent. E-cadherin and prognostic markers (ER/PR/ki-67/HER2-FISH)are pending and will be reported in an addendum. Dr. Lyndon Code reviewed the case and agrees with the above diagnosis.    02/10/2017 Initial Diagnosis    Carcinoma of upper-outer quadrant of left breast in female, estrogen receptor positive ([place])    02/19/2017 Imaging    Bone Scan whole Body 02/19/17 IMPRESSION: Negative for evidence of bony metastatic disease.    02/19/2017 Imaging    CT CAP W Contrast 02/19/17 IMPRESSION: 1. Small right middle lobe pulmonary nodules up to 0.8 cm, nonspecific. Non-contrast chest CT at 3-6 months is recommended. If the nodules are stable at time of repeat CT, then future CT at 18-24 months (from today's scan) is considered optional for low-risk patients, but is recommended for high-risk patients. This recommendation follows the consensus statement: Guidelines for Management of Incidental Pulmonary Nodules Detected on CT Images: From the Fleischner Society 2017; Radiology 2017; 284:228-243. 2. Subcentimeter hepatic lesions likely cysts but incompletely characterized due to small size. Attention on follow-up imaging recommended. 3.  2.1 cm fusiform right common iliac artery aneurysm. Continued surveillance recommended. 4. Aortic Atherosclerosis (ICD10-I70.0) and Emphysema (ICD10-J43.9).    02/23/2017 Pathology Results    Diagnosis 1. Breast, simple mastectomy, Left Total - INVASIVE DUCTAL CARCINOMA, MULTIFOCAL, NOTTINGHAM GRADE 3/3 (5.3 CM, 3.5 CM) - INVASIVE CARCINOMA INVOLVES THE DERMIS - DUCTAL CARCINOMA IN SITU, INTERMEDIATE GRADE - HYALINIZED FIBROADENOMA - MARGINS UNINVOLVED BY CARCINOMA - NO CARCINOMA IDENTIFIED IN TWO LYMPH NODES (0/2) - SEE ONCOLOGY TABLE AND COMMENT BELOW 2. Lymph node, sentinel, biopsy, Left Axillary - NO CARCINOMA IDENTIFIED IN ONE LYMPH NODE (0/1) 3. Lymph node, sentinel, biopsy - NO CARCINOMA IDENTIFIED IN ONE LYMPH NODE (0/1) 4. Lymph node, sentinel, biopsy - NO CARCINOMA IDENTIFIED IN ONE LYMPH NODE (0/1) 5. Lymph node, sentinel, biopsy - NO CARCINOMA IDENTIFIED IN ONE LYMPH NODE (0/1) Microscopic Comment 1. BREAST, INVASIVE TUMOR Procedure: Simple mastectomy Laterality: Left Tumor Size: 5. 3 cm, 3.5 cm Histologic Type: Invasive carcinoma of no special type (ductal, not otherwise specified) Grade: Nottingham Grade 3 Tubular Differentiation: 3 Nuclear Pleomorphism: 3 Mitotic Count: 2 Ductal Carcinoma in Situ (DCIS): Present Extent of Tumor: Skin: Invasive carcinoma directly invades into the dermis or epidermis without skin ulceration Margins: Invasive carcinoma, distance from closest margin: 1.5 cm (posterior) DCIS, distance from closest margin: > 1 cm Regional Lymph Nodes: Number of Lymph Nodes Examined: 6 Number of Sentinel Lymph Nodes Examined: 4 Lymph Nodes with Macrometastases: 0 Lymph Nodes with Micrometastases: 0 Lymph Nodes with Isolated Tumor Cells: 0 Treatment effect: No known presurgical therapy Breast Prognostic Profile: See Also ([id number]) Estrogen Receptor: Positive (100%, strong); Positive (100%, strong) Progesterone Receptor: Positive (50%,  strong); Positive (50%, strong) Her2: Positive (Ratio 2.80); Negative (Ratio 1.28) Ki-67: 70%; 60% Best tumor block for sendout testing:  1E Pathologic Stage Classification (pTNM, AJCC 8th Edition): Primary Tumor: mpT3 Regional Lymph Nodes: pN0 COMMENT: The two invasive carcinomas in the breast have slightly different morphologies. The larger lesion has a papillary and micropapillary pattern admixed with typical ductal carcinoma while the smaller lesion is more typical of a ductal lesion with linear arrays (E-cadherin performed on biopsy). There are foci within the larger lesion that are concerning for lymphovascular space invasion.     04/07/2017 -  Chemotherapy    weekly taxol and herceptin x12 weeks, 04/07/17-06/23/17. Then herceptin q3 weeks for total 1 year which was switched to Kadcyla on 07/14/17.    07/20/2017 - 09/03/2017 Radiation Therapy    She started adjuvant radiation on 07/20/17 with Dr. Lisbeth Renshaw and plans to complete on 09/03/17.     08/24/2017 -  Chemotherapy    maintenance Herceptin      Interval History:  Penny Huynh presents today for neuropathy follow up.  She does describe some improvement in the burning and pins/needles pain symptoms in her feet, although it is still present.  She says the numbness in her fingers has progressed somewhat.  Still feels somewhat off balance because "can't feel where my feet are".  Interested in resuming physical therapy.  H+P: presents today to discuss her neuropathy symptoms.  She describes painful burning affecting her feet and lower to mid-shins symmetrically.  This started sometime after taxol chemotherapy was started in March (continued through May) and has persisted or even progressed.  At this time she also describes feeling of numbness and tingling affecting her finger tips on both hands.  Her walking is impaired because of poor sensation and balance, she is requiring a walker fora ambulation.  There is also noted swelling of both feet.  She  does acknowledge a milder version of these symptoms, affecting the bottoms of her feet only, which were present prior to the onset of chemotherapy.  She has long-standing diagnosis of diabetes.  Presently she is receiving Herceptin and no further cytotoxic chemotherapy.  Taking Gabapentin 637m 3x day without efficacy.  Medications: Current Outpatient Medications on File Prior to Visit  Medication Sig Dispense Refill  . amitriptyline (ELAVIL) 50 MG tablet Take 1 tablet (50 mg total) by mouth at bedtime. 30 tablet 3  . amLODipine (NORVASC) 5 MG tablet Take 1 tablet (5 mg total) by mouth daily. 30 tablet 2  . aspirin 81 MG EC tablet Take 81 mg by mouth at bedtime.     . chlorthalidone (HYGROTON) 25 MG tablet Take 25 mg by mouth daily.     . Coenzyme Q10 (COQ10) 100 MG CAPS Take 100 mg by mouth daily.      .Marland Kitchengabapentin (NEURONTIN) 300 MG capsule Take 3 capsules (900 mg total) by mouth 3 (three) times daily. 120 capsule 3  . HYDROcodone-acetaminophen (NORCO/VICODIN) 5-325 MG tablet Take 1 tablet by mouth every 6 (six) hours as needed for moderate pain. 40 tablet 0  . latanoprost (XALATAN) 0.005 % ophthalmic solution Place 1 drop into both eyes at bedtime.    . lidocaine-prilocaine (EMLA) cream APPLY CREAM TOPICALLY TO AFFECTED AREA ONCE  3  . losartan (COZAAR) 50 MG tablet Take 50 mg by mouth daily.    . metFORMIN (GLUCOPHAGE) 500 MG tablet Take 1,000 mg by mouth daily with breakfast.     . potassium chloride SA (K-DUR,KLOR-CON) 20 MEQ tablet Take 1 tablet (20 mEq total) by mouth 2 (two) times daily. 60 tablet 1  . vitamin B-12 (CYANOCOBALAMIN) 1000  MCG tablet Take 1,000 mcg by mouth daily.    . [DISCONTINUED] prochlorperazine (COMPAZINE) 10 MG tablet Take 1 tablet (10 mg total) by mouth every 6 (six) hours as needed (Nausea or vomiting). 30 tablet 1   No current facility-administered medications on file prior to visit.     Allergies: No Known Allergies Past Medical History:  Past Medical  History:  Diagnosis Date  . Arthritis    "maybe a little in my left knee" (02/23/2017)  . Asthmatic bronchitis   . Cancer of left breast ([place])   . Coronary atherosclerosis    Minor nonobstructive CAD at cardiac catheterization 2009  . Dyslipidemia   . Femoral bruit 7/08   With normal ABIs  . Hypertension   . Lower extremity edema    Chronic  . Pneumonia ~ 1950   in 8th grade  . Type II diabetes mellitus ([place])    Past Surgical History:  Past Surgical History:  Procedure Laterality Date  . APPENDECTOMY    . BREAST BIOPSY Left 02/2017  . CARDIAC CATHETERIZATION     "long time ago" (02/23/2017)  . MASTECTOMY COMPLETE / SIMPLE W/ SENTINEL NODE BIOPSY Left 02/23/2017   TOTAL MATECTOMY;  LEFT AXILLARY SENTINEL LYMPH NODE BIOPSY ERAS PATHWAY  . MASTECTOMY WITH RADIOACTIVE SEED GUIDED EXCISION AND AXILLARY SENTINEL LYMPH NODE BIOPSY Left 02/23/2017   Procedure: LEFT TOTAL MATECTOMY;  LEFT AXILLARY SENTINEL LYMPH NODE BIOPSY ERAS PATHWAY;  Surgeon: Excell Seltzer, MD;  Location: [facility];  Service: General;  Laterality: Left;  . PILONIDAL CYST EXCISION    . PORTA CATH INSERTION  02/23/2017  . PORTACATH PLACEMENT N/A 02/23/2017   Procedure: INSERTION PORT-A-CATH;  Surgeon: Excell Seltzer, MD;  Location: [facility];  Service: General;  Laterality: N/A;  . SHOULDER ARTHROSCOPY W/ ROTATOR CUFF REPAIR Right    Social History:  Social History   Socioeconomic History  . Marital status: Married    Spouse name: Not on file  . Number of children: 2  . Years of education: Not on file  . Highest education level: Not on file  Occupational History  . Occupation: Counselling psychologist for [employer]    Comment: only works during tax season but not currently   . Occupation: in Dentist   . Occupation: retired Mining engineer of education     Comment: [place]  . Financial resource strain: Not on file  . Food insecurity:    Worry: Not on file    Inability: Not on file  . Transportation needs:     Medical: Not on file    Non-medical: Not on file  Tobacco Use  . Smoking status: Former Smoker    Packs/day: 2.00    Years: 46.00    Pack years: 92.00    Types: Cigarettes    Start date: 11/09/1953    Last attempt to quit: 2000    Years since quitting: 19.6  . Smokeless tobacco: Never Used  Substance and Sexual Activity  . Alcohol use: Yes    Alcohol/week: 2.0 standard drinks    Types: 2 Glasses of wine per week  . Drug use: No  . Sexual activity: Not on file  Lifestyle  . Physical activity:    Days per week: Not on file    Minutes per session: Not on file  . Stress: Not on file  Relationships  . Social connections:    Talks on phone: Not on file    Gets together: Not on file  Attends religious service: Not on file    Active member of club or organization: Not on file    Attends meetings of clubs or organizations: Not on file    Relationship status: Not on file  . Intimate partner violence:    Fear of current or ex partner: Not on file    Emotionally abused: Not on file    Physically abused: Not on file    Forced sexual activity: Not on file  Other Topics Concern  . Not on file  Social History Narrative   Lives in [place] with her husband.   Has 2 grown children.   Family History:  Family History  Problem Relation Age of Onset  . Diabetes Mother   . Stroke Father   . Cancer Father        unknown type     Review of Systems: Constitutional: Denies fevers, chills or abnormal weight loss Eyes: Denies blurriness of vision Ears, nose, mouth, throat, and face: Denies mucositis or sore throat Respiratory: Denies cough, dyspnea or wheezes Cardiovascular: Denies palpitation, chest discomfort or lower extremity swelling Gastrointestinal:  Denies nausea, constipation, diarrhea GU: Denies dysuria or incontinence Skin: Denies abnormal skin rashes Neurological: Per HPI Musculoskeletal: +knee pain Behavioral/Psych: Denies anxiety, disturbance in thought content, and mood  instability   Physical Exam:  09/23/17 09/03/17 09/02/17  BP 142/70 [nurse aware of bp] 144/78 [Natalie RN is aware] 148/73  Pulse Rate 82 81 76  Resp 18 18 17   Temp 98.1 F (36.7 C) 97.8 F (36.6 C) 97.7 F (36.5 C)  Temp Source Oral Oral Oral  SpO2 98 % 100 % 100 %  Weight 150 lb 6.4 oz (68.2 kg) 148 lb (67.1 kg) 143 lb 12.8 oz (65.2 kg)  Height 5' 4.5" (1.638 m) 5' 4.5" (1.638 m) 5' 4.5" (1.638 m)  Pain Score -- 10 --   KPS: 60. General: Alert, cooperative, pleasant, in no acute distress Head: Craniotomy scar noted, dry and intact. EENT: No conjunctival injection or scleral icterus. Oral mucosa moist Lungs: Resp effort normal Cardiac: Regular rate and rhythm Abdomen: Soft, non-distended abdomen Skin: No rashes cyanosis or petechiae. Extremities: No clubbing or edema  Neurologic Exam: Mental Status: Awake, alert, attentive to examiner. Oriented to self and environment. Language is fluent with intact comprehension.  Cranial Nerves: Visual acuity is grossly normal. Visual fields are full. Extra-ocular movements intact. No ptosis. Face is symmetric, tongue midline. Motor: Tone and bulk are normal. Power is full in both arms and legs. Reflexes are decreased bilaterally, no pathologic reflexes present. Intact finger to nose bilaterally Sensory: Impaired to light touch and temp lower legs and fingertips Gait: Deferred   Labs: I have reviewed the data as listed    Component Value Date/Time   NA 130 (L) 09/23/2017 0908   K 3.8 09/23/2017 0908   CL 94 (L) 09/23/2017 0908   CO2 28 09/23/2017 0908   GLUCOSE 103 (H) 09/23/2017 0908   BUN 10 09/23/2017 0908   CREATININE 0.73 09/23/2017 0908   CALCIUM 9.7 09/23/2017 0908   PROT 7.1 09/23/2017 0908   ALBUMIN 3.3 (L) 09/23/2017 0908   AST 24 09/23/2017 0908   ALT 22 09/23/2017 0908   ALKPHOS 63 09/23/2017 0908   BILITOT 0.5 09/23/2017 0908   GFRNONAA >60 09/23/2017 0908   GFRAA >60 09/23/2017 0908   Lab Results  Component Value  Date   WBC 2.9 (L) 09/23/2017   NEUTROABS 1.8 09/23/2017   HGB 10.9 (L) 09/23/2017  HCT 33.1 (L) 09/23/2017   MCV 88.4 09/23/2017   PLT 244 09/23/2017      Assessment/Plan 1. Chemotherapy-induced peripheral neuropathy [facility])  Ms. Waheed has a symmetric distal polyneuropathy which is secondary to taxol exposure combined with likely pre-existing diabetic neuropathy.  There is evidence of both small and large fiber involvement.  No significant motor impairment at this time is appreciated.  She is having some dry mouth with amytryptiline.  At this time she would prefer not to increase any of the medications further.    We strongly recommended initiating physical therapy for gait and ambulation rehabilitation.      We appreciate the opportunity to participate in the care of Penny Huynh.   All questions were answered. The patient knows to call the clinic with any problems, questions or concerns. No barriers to learning were detected.  The total time spent in the encounter was 25 minutes and more than 50% was on counseling and review of test results   Ventura Sellers, MD Medical Director of Neuro-Oncology [facility] [place] 09/23/17 10:32 AM

## 2017-09-23 NOTE — Patient Instructions (Signed)
Cancer Center Discharge Instructions for Patients Receiving Chemotherapy  Today you received the following chemotherapy agents herceptin   To help prevent nausea and vomiting after your treatment, we encourage you to take your nausea medication as directed   If you develop nausea and vomiting that is not controlled by your nausea medication, call the clinic.   BELOW ARE SYMPTOMS THAT SHOULD BE REPORTED IMMEDIATELY:  *FEVER GREATER THAN 100.5 F  *CHILLS WITH OR WITHOUT FEVER  NAUSEA AND VOMITING THAT IS NOT CONTROLLED WITH YOUR NAUSEA MEDICATION  *UNUSUAL SHORTNESS OF BREATH  *UNUSUAL BRUISING OR BLEEDING  TENDERNESS IN MOUTH AND THROAT WITH OR WITHOUT PRESENCE OF ULCERS  *URINARY PROBLEMS  *BOWEL PROBLEMS  UNUSUAL RASH Items with * indicate a potential emergency and should be followed up as soon as possible.  Feel free to call the clinic you have any questions or concerns. The clinic phone number is [phone].  

## 2017-09-24 ENCOUNTER — Telehealth: Payer: Self-pay | Admitting: Hematology

## 2017-09-24 NOTE — Telephone Encounter (Signed)
F/u scheduled per 8/22 los

## 2017-09-28 ENCOUNTER — Ambulatory Visit: Payer: Medicare Other | Attending: Hematology | Admitting: Physical Therapy

## 2017-09-28 ENCOUNTER — Encounter: Payer: Self-pay | Admitting: Physical Therapy

## 2017-09-28 DIAGNOSIS — R209 Unspecified disturbances of skin sensation: Secondary | ICD-10-CM | POA: Insufficient documentation

## 2017-09-28 DIAGNOSIS — R293 Abnormal posture: Secondary | ICD-10-CM | POA: Diagnosis not present

## 2017-09-28 DIAGNOSIS — Z483 Aftercare following surgery for neoplasm: Secondary | ICD-10-CM | POA: Insufficient documentation

## 2017-09-28 DIAGNOSIS — R2681 Unsteadiness on feet: Secondary | ICD-10-CM | POA: Diagnosis not present

## 2017-09-28 NOTE — Therapy (Addendum)
[place], [place], [address] Phone: [phone]   Fax:  [phone]  Physical Therapy Re-Evaluation   Patient Details  Name: Penny Huynh MRN: [id number] Date of Birth: 06/28/1933 Referring Provider: [name]    Encounter Date: 09/28/2017   Progress Note Reporting Period 08/06/2017 to 09/28/2017  See note below for Objective Data and Assessment of Progress/Goals.   Annia Friendly, [place] 12/21/17 2:57 PM     PT End of Session - 09/28/17 1454    Visit Number  38   kx   Number of Visits  54    Date for PT Re-Evaluation  11/28/17    PT Start Time  1350    PT Stop Time  1440    PT Time Calculation (min)  50 min    Activity Tolerance  Patient tolerated treatment well;Patient limited by pain    Behavior During Therapy  [facility] for tasks assessed/performed       Past Medical History:  Diagnosis Date  . Arthritis    "maybe a little in my left knee" (02/23/2017)  . Asthmatic bronchitis   . Cancer of left breast ([place])   . Coronary atherosclerosis    Minor nonobstructive CAD at cardiac catheterization 2009  . Dyslipidemia   . Femoral bruit 7/08   With normal ABIs  . Hypertension   . Lower extremity edema    Chronic  . Pneumonia ~ 1950   in 8th grade  . Type II diabetes mellitus ([place])     Past Surgical History:  Procedure Laterality Date  . APPENDECTOMY    . BREAST BIOPSY Left 02/2017  . CARDIAC CATHETERIZATION     "long time ago" (02/23/2017)  . MASTECTOMY COMPLETE / SIMPLE W/ SENTINEL NODE BIOPSY Left 02/23/2017   TOTAL MATECTOMY;  LEFT AXILLARY SENTINEL LYMPH NODE BIOPSY ERAS PATHWAY  . MASTECTOMY WITH RADIOACTIVE SEED GUIDED EXCISION AND AXILLARY SENTINEL LYMPH NODE BIOPSY Left 02/23/2017   Procedure: LEFT TOTAL MATECTOMY;  LEFT AXILLARY SENTINEL LYMPH NODE BIOPSY ERAS PATHWAY;  Surgeon: Excell Seltzer, MD;  Location: [facility];  Service: General;  Laterality: Left;  . PILONIDAL CYST  EXCISION    . PORTA CATH INSERTION  02/23/2017  . PORTACATH PLACEMENT N/A 02/23/2017   Procedure: INSERTION PORT-A-CATH;  Surgeon: Excell Seltzer, MD;  Location: [facility];  Service: General;  Laterality: N/A;  . SHOULDER ARTHROSCOPY W/ ROTATOR CUFF REPAIR Right     There were no vitals filed for this visit.  Subjective Assessment - 09/28/17 1354    Subjective   Pt returns to Physical therapy to work on her walking and her balance     Pertinent History  Patient underwent a left mastectomy and sentinel node biopsy (6 negative nodes) on 02/23/17. She will begin chemotherapy 03/31/17 as long as drain is removed today. She has 2 different breast cancers. Both are ER/PR positive but one is HER2 negative and one is HER2 positive. History of right shoulder scope 20 years ago and has diabetes and hypertension, both of which are well controlled. 09/28/2017: pt has completed chemo and radiation completed 09/03/2017.  She is still getting herceptin infusions every 3 weeks.  She continues to struggle with chemotherapy induced peripheral neuropathy in her hands and feet.  Her hands feel cold and she has pain and numbness in her feet     Patient Stated Goals  to improve her balance and walking     Currently in Pain?  Yes  Pain Score  10-Worst pain ever    Pain Location  Foot    Pain Orientation  Right;Left    Pain Descriptors / Indicators  Burning;Sharp;Pins and needles    Pain Type  Neuropathic pain    Pain Onset  More than a month ago    Pain Frequency  Constant    Pain Relieving Factors  can't say , she as a foot bath and foot massagers at home, and they may help a little     Effect of Pain on Daily Activities  unable to dress herself,  Can't do her grocery shopping          [facility] PT Assessment - 09/28/17 0001      Assessment   Medical Diagnosis  s/p left mastectomy and SLNB    Referring Provider  [name]     Onset Date/Surgical Date  02/23/17      Prior Function   Level of Independence  Needs  assistance with ADLs   due to CIPN     Observation/Other Assessments   Observations  Pt comes into clinic walking with a cane with supportive athletic shoes on both feet.     Skin Integrity  open area on chest from radiation moist desquamation. , pt has difference in skin color on chest with tightness visible in axilla  She has not been able to wear her breast prostheses due to open areas       Sit to Stand   Comments  13 sit to stands in 30 seconds       AROM   AROM Assessment Site  Other (comment)   right grip 30/25/35  left 25/20/25   Left Shoulder Flexion  150 Degrees    Left Shoulder ABduction  150 Degrees      Timed Up and Go Test   Normal TUG (seconds)  14.14   was 12 sec on Jun 08, 2017 used a cane during TUG today        LYMPHEDEMA/ONCOLOGY QUESTIONNAIRE - 09/28/17 1406      Right Upper Extremity Lymphedema   10 cm Proximal to Olecranon Process  26.5 cm    Olecranon Process  23.5 cm    10 cm Proximal to Ulnar Styloid Process  19.8 cm    Just Proximal to Ulnar Styloid Process  16 cm    Across Hand at [employer]  19 cm    At [place] of 2nd Digit  5.9 cm      Left Upper Extremity Lymphedema   10 cm Proximal to Olecranon Process  28 cm    Olecranon Process  23.2 cm    10 cm Proximal to Ulnar Styloid Process  20.1 cm    Just Proximal to Ulnar Styloid Process  16 cm    Across Hand at [employer]  19 cm    At [place] of 2nd Digit  6.2 cm                [facility] Adult PT Treatment/Exercise - 09/28/17 0001      Modalities   Modalities  Moist Heat      Moist Heat Therapy   Number Minutes Moist Heat  10 Minutes    Moist Heat Location  --   both feet      Manual Therapy   Soft tissue mobilization  to both feet to tolerance                PT Short Term Goals -  09/28/17 1518      PT SHORT TERM GOAL #1   Title  Pt will decrease TUG score to 13 sec indicating an improvment in functional balance with decrased risk for fall     Baseline  14.14     Time  4    Period  Weeks    Status  New        PT Long Term Goals - 09/28/17 1519      PT LONG TERM GOAL #1   Title  Pt will decrease TUG score to 12 indicating decreased risk for falls.     Baseline  14.14    Time  8    Period  Weeks    Status  New      PT LONG TERM GOAL #2   Title  Pt will be independent in  a home exercise program for balance improvment     Time  8    Period  Weeks    Status  New      PT LONG TERM GOAL #3   Title  Pt will report he feet pain is decreased to 7/10 at times     Baseline  10+/10    Time  8    Period  Weeks    Status  New      PT LONG TERM GOAL #4   Title  Pt will report she feels an improvment in her walking and balance and that she is more comfortable with walking during outings in her daily life     Time  8    Period  Weeks    Status  New            Plan - 09/28/17 1455    Clinical Impression Statement  Pt returns to PT for renewal after completing her radiation.  She continues to have severe neuropathy pain in feet and hands that is impairing her ability to walk and do ADL's  Pt lives a long way from our clinic and is interested in going for PT at [facility] since it is much closer to where she lives.  She has lost weight and shows a reduction in the circumference of her left arm although the upper arm still shows an increase over right arm Pt comes in wearing her sleeve and glove and is independent in putting it back on at end of session     Clinical Presentation  Stable    Clinical Decision Making  Low    Rehab Potential  Good    Clinical Impairments Affecting Rehab Potential  chemotherapy induced peripheral neuropathy, pt states she goes to a community exercise program     PT Frequency  2x / week    PT Duration  8 weeks    PT Treatment/Interventions  ADLs/Self Care Home Management;Neuromuscular re-education;Gait training;Electrical Stimulation;Contrast Bath;DME Instruction;Moist Heat;Functional mobility training;Therapeutic  activities;Therapeutic exercise;Balance training;Patient/family education;Manual techniques;Taping;Passive range of motion    PT Next Visit Plan  Do BERG . Assess if she got relief from moist heat and soft tissue work for pain managment. Teach home program for ankle strength and hip strength.   Facilitate pt getting treatment at [facility]     Consulted and Agree with Plan of Care  Patient       Patient will benefit from skilled therapeutic intervention in order to improve the following deficits and impairments:  Abnormal gait, Decreased knowledge of use of DME, Decreased skin integrity, Pain, Postural dysfunction, Decreased mobility, Decreased activity tolerance,  Decreased endurance, Difficulty walking, Decreased balance  Visit Diagnosis: Aftercare following surgery for neoplasm - Plan: PT plan of care cert/re-cert  Unsteadiness on feet - Plan: PT plan of care cert/re-cert  Unspecified disturbances of skin sensation - Plan: PT plan of care cert/re-cert  Abnormal posture - Plan: PT plan of care cert/re-cert     Problem List Patient Active Problem List   Diagnosis Date Noted  . Chemotherapy-induced peripheral neuropathy ([place]) 09/03/2017  . Port-A-Cath in place 04/14/2017  . Stage II breast cancer, left ([place]) 02/23/2017  . Carcinoma of upper-outer quadrant of left breast in female, estrogen receptor positive ([place]) 02/10/2017  . Carcinoma of upper-inner quadrant of left breast in female, estrogen receptor positive ([place]) 02/10/2017  . Chest pain 07/18/2015  . Hypokalemia 07/18/2015  . Vertigo 07/18/2015  . LBBB (left bundle branch block) 05/28/2009  . AODM 12/21/2007  . Essential hypertension, benign 12/21/2007  . DIASTOLIC DYSFUNCTION 92/02/69  . FEMORAL BRUIT, RIGHT 12/21/2007  . DYSPNEA 12/21/2007   Donato Heinz. Owens Shark PT  Norwood Levo 09/28/2017, 3:26 PM  [place], [place],  [address] Phone: [phone]   Fax:  [phone]  Name: Penny Huynh MRN: [id number] Date of Birth: 09-Oct-1933

## 2017-10-06 ENCOUNTER — Ambulatory Visit: Payer: Medicare Other

## 2017-10-06 ENCOUNTER — Ambulatory Visit: Payer: Medicare Other | Admitting: Nurse Practitioner

## 2017-10-06 ENCOUNTER — Other Ambulatory Visit: Payer: Medicare Other

## 2017-10-06 ENCOUNTER — Ambulatory Visit: Payer: Medicare Other | Attending: Hematology

## 2017-10-06 DIAGNOSIS — R209 Unspecified disturbances of skin sensation: Secondary | ICD-10-CM | POA: Insufficient documentation

## 2017-10-06 DIAGNOSIS — R2681 Unsteadiness on feet: Secondary | ICD-10-CM | POA: Diagnosis not present

## 2017-10-06 DIAGNOSIS — Z483 Aftercare following surgery for neoplasm: Secondary | ICD-10-CM | POA: Diagnosis not present

## 2017-10-06 DIAGNOSIS — R293 Abnormal posture: Secondary | ICD-10-CM | POA: Diagnosis not present

## 2017-10-06 NOTE — Therapy (Signed)
[place], [place], [address] Phone: [phone]   Fax:  [phone]  Physical Therapy Treatment  Patient Details  Name: Penny Huynh MRN: [id number] Date of Birth: 1933/12/06 Referring Provider: [name]    Encounter Date: 10/06/2017  PT End of Session - 10/06/17 1023    Visit Number  6   kx   Number of Visits  54    Date for PT Re-Evaluation  11/28/17    PT Start Time  0934    PT Stop Time  1020    PT Time Calculation (min)  46 min    Activity Tolerance  Patient tolerated treatment well       Past Medical History:  Diagnosis Date  . Arthritis    "maybe a little in my left knee" (02/23/2017)  . Asthmatic bronchitis   . Cancer of left breast ([place])   . Coronary atherosclerosis    Minor nonobstructive CAD at cardiac catheterization 2009  . Dyslipidemia   . Femoral bruit 7/08   With normal ABIs  . Hypertension   . Lower extremity edema    Chronic  . Pneumonia ~ 1950   in 8th grade  . Type II diabetes mellitus ([place])     Past Surgical History:  Procedure Laterality Date  . APPENDECTOMY    . BREAST BIOPSY Left 02/2017  . CARDIAC CATHETERIZATION     "long time ago" (02/23/2017)  . MASTECTOMY COMPLETE / SIMPLE W/ SENTINEL NODE BIOPSY Left 02/23/2017   TOTAL MATECTOMY;  LEFT AXILLARY SENTINEL LYMPH NODE BIOPSY ERAS PATHWAY  . MASTECTOMY WITH RADIOACTIVE SEED GUIDED EXCISION AND AXILLARY SENTINEL LYMPH NODE BIOPSY Left 02/23/2017   Procedure: LEFT TOTAL MATECTOMY;  LEFT AXILLARY SENTINEL LYMPH NODE BIOPSY ERAS PATHWAY;  Surgeon: Excell Seltzer, MD;  Location: [facility];  Service: General;  Laterality: Left;  . PILONIDAL CYST EXCISION    . PORTA CATH INSERTION  02/23/2017  . PORTACATH PLACEMENT N/A 02/23/2017   Procedure: INSERTION PORT-A-CATH;  Surgeon: Excell Seltzer, MD;  Location: [facility];  Service: General;  Laterality: N/A;  . SHOULDER ARTHROSCOPY W/ ROTATOR CUFF REPAIR Right      There were no vitals filed for this visit.  Subjective Assessment - 10/06/17 0936    Subjective  I think I would like to transfer to [name] as long as they can continue to address my issues like you guys. I think my neuropathy is a little better than it was a month or so ago but it is still very debilitating.     Pertinent History  Patient underwent a left mastectomy and sentinel node biopsy (6 negative nodes) on 02/23/17. She will begin chemotherapy 03/31/17 as long as drain is removed today. She has 2 different breast cancers. Both are ER/PR positive but one is HER2 negative and one is HER2 positive. History of right shoulder scope 20 years ago and has diabetes and hypertension, both of which are well controlled. 09/28/2017: pt has completed chemo and radiation completed 09/03/2017.  She is still getting herceptin infusions every 3 weeks.  She continues to struggle with chemotherapy induced peripheral neuropathy in her hands and feet.  Her hands feel cold and she has pain and numbness in her feet     Patient Stated Goals  to improve her balance and walking     Currently in Pain?  Yes    Pain Score  8     Pain Location  Foot    Pain  Orientation  Right;Left    Pain Descriptors / Indicators  Burning;Pins and needles    Pain Type  Neuropathic pain    Pain Onset  More than a month ago    Pain Frequency  Constant    Aggravating Factors   constant in hands and feet    Pain Relieving Factors  just always hurts         [facility] PT Assessment - 10/06/17 0001      Berg Balance Test   Sit to Stand  Able to stand without using hands and stabilize independently    Standing Unsupported  Able to stand safely 2 minutes    Sitting with Back Unsupported but Feet Supported on Floor or Stool  Able to sit safely and securely 2 minutes    Stand to Sit  Sits safely with minimal use of hands    Transfers  Able to transfer safely, minor use of hands    Standing Unsupported with Eyes Closed  Able to stand 10  seconds with supervision    Standing Ubsupported with Feet Together  Able to place feet together independently and stand 1 minute safely    From Standing, Reach Forward with Outstretched Arm  Can reach forward >12 cm safely (5")    From Standing Position, Pick up Object from Floor  Able to pick up shoe safely and easily    From Standing Position, Turn to Look Behind Over each Shoulder  Looks behind from both sides and weight shifts well    Turn 360 Degrees  Able to turn 360 degrees safely but slowly    Standing Unsupported, Alternately Place Feet on Step/Stool  Able to complete >2 steps/needs minimal assist    Standing Unsupported, One Foot in [employer] balance while stepping or standing    Standing on One Leg  Tries to lift leg/unable to hold 3 seconds but remains standing independently    Total Score  42                   [facility] Adult PT Treatment/Exercise - 10/06/17 0001      Knee/Hip Exercises: Aerobic   Stationary Bike  Level 1 x 4 minutes with 1 rest during      Knee/Hip Exercises: Standing   Heel Raises  Both;20 reps   at back of bike for +2 UE/fingertip support   Hip Extension  AROM;Right;Left;5 reps   fingertips on back of bike   Forward Step Up  Right;Left;10 reps;Hand Hold: 1;Step Height: 6"   3 sec holds on step   SLS  1 x 30 sec practice with fingertips at back of bike    Other Standing Knee Exercises  Bil tandem stance at back of bike with fingertip support intermittently x30 seconds each leg             PT Education - 10/06/17 1021    Education provided  Yes    Education Details  Balance exercises    Person(s) Educated  Patient    Methods  Explanation;Demonstration;Handout    Comprehension  Verbalized understanding;Returned demonstration       PT Short Term Goals - 09/28/17 1518      PT SHORT TERM GOAL #1   Title  Pt will decrease TUG score to 13 sec indicating an improvment in functional balance with decrased risk for fall     Baseline   14.14    Time  4    Period  Weeks  Status  New        PT Long Term Goals - 09/28/17 1519      PT LONG TERM GOAL #1   Title  Pt will decrease TUG score to 12 indicating decreased risk for falls.     Baseline  14.14    Time  8    Period  Weeks    Status  New      PT LONG TERM GOAL #2   Title  Pt will be independent in  a home exercise program for balance improvment     Time  8    Period  Weeks    Status  New      PT LONG TERM GOAL #3   Title  Pt will report he feet pain is decreased to 7/10 at times     Baseline  10+/10    Time  8    Period  Weeks    Status  New      PT LONG TERM GOAL #4   Title  Pt will report she feels an improvment in her walking and balance and that she is more comfortable with walking during outings in her daily life     Time  8    Period  Weeks    Status  New            Plan - 10/06/17 1024    Clinical Impression Statement  Pt has improved in her gait since this therapist last saw her. Retested BERG balance and it has decreased since last tested  was 55, today was 42. Progressed her HEP to include balance exercises today which she tolerated well though was challenged by. Pt would like to switch her care to [facility] as this will be closer to where she lives in [place] so our office contacted them today.  Encouraged her to resume HEP of hip 3 way in standing as she was doing before and she verbalized understanding this. Also educated pt on working exercises into her day as able.     Rehab Potential  Good    Clinical Impairments Affecting Rehab Potential  chemotherapy induced peripheral neuropathy, pt states she goes to a community exercise program     PT Frequency  2x / week    PT Duration  8 weeks    PT Treatment/Interventions  ADLs/Self Care Home Management;Neuromuscular re-education;Gait training;Electrical Stimulation;Contrast Bath;DME Instruction;Moist Heat;Functional mobility training;Therapeutic activities;Therapeutic exercise;Balance  training;Patient/family education;Manual techniques;Taping;Passive range of motion    PT Next Visit Plan  Review home program for hip strength and balance.  Cont higher level balance activities and LE strength.     Consulted and Agree with Plan of Care  Patient       Patient will benefit from skilled therapeutic intervention in order to improve the following deficits and impairments:  Abnormal gait, Decreased knowledge of use of DME, Decreased skin integrity, Pain, Postural dysfunction, Decreased mobility, Decreased activity tolerance, Decreased endurance, Difficulty walking, Decreased balance  Visit Diagnosis: Aftercare following surgery for neoplasm  Unsteadiness on feet  Unspecified disturbances of skin sensation  Abnormal posture     Problem List Patient Active Problem List   Diagnosis Date Noted  . Chemotherapy-induced peripheral neuropathy ([place]) 09/03/2017  . Port-A-Cath in place 04/14/2017  . Stage II breast cancer, left ([place]) 02/23/2017  . Carcinoma of upper-outer quadrant of left breast in female, estrogen receptor positive ([place]) 02/10/2017  . Carcinoma of upper-inner quadrant of left breast in female, estrogen receptor positive ([place])  02/10/2017  . Chest pain 07/18/2015  . Hypokalemia 07/18/2015  . Vertigo 07/18/2015  . LBBB (left bundle branch block) 05/28/2009  . AODM 12/21/2007  . Essential hypertension, benign 12/21/2007  . DIASTOLIC DYSFUNCTION 79/55/8316  . FEMORAL BRUIT, RIGHT 12/21/2007  . DYSPNEA 12/21/2007    Otelia Limes, PTA 10/06/2017, 11:31 AM  [place], [place], [address] Phone: [phone]   Fax:  [phone]  Name: GISSEL KEILMAN MRN: [id number] Date of Birth: [birth year]

## 2017-10-06 NOTE — Patient Instructions (Addendum)
Heel Raise: Bilateral (Standing)   Cancer Rehab [id number]    Stand near counter for fingertip support if needed. Rise on balls of feet. Repeat __10-20__ times per set. Do _1-2___ sets per session. Do __2__ sessions per day.  SINGLE LIMB STANCE    Stand at counter (corner if you have one) for minimal arm support. Raise leg. Hold _10-20__ seconds. Repeat with other leg. Once this becomes easier, for increased challenge, close eyes with fingertips on counter for safety. _3-5__ reps per set, _2-3__ sets per day.  Tandem Stance    Stand at counter (corner if you have one). Right foot in front of left, heel touching toe both feet "straight ahead". Stand on Foot Triangle of Support with both feet. Balance in this position _10-20__ seconds. Do with left foot in front of right. Once this becomes easier, for increased challenge, close eyes with fingertips on counter for safety.  Step-Up: Forward    Hold rail if at steps or fingertips on bottom shelf of cabinet when at counter. Step up forward keeping opposite foot off step. Keep pelvis level and back straight. Hold the single leg stand for 3 seconds, then come back down slowly.  Do _10__ times, do _1-2_ sets, on each leg, _1-2__ times per day.

## 2017-10-08 ENCOUNTER — Other Ambulatory Visit: Payer: Self-pay

## 2017-10-08 ENCOUNTER — Ambulatory Visit (HOSPITAL_COMMUNITY): Payer: Medicare Other | Attending: Hematology | Admitting: Physical Therapy

## 2017-10-08 DIAGNOSIS — Z483 Aftercare following surgery for neoplasm: Secondary | ICD-10-CM | POA: Diagnosis not present

## 2017-10-08 DIAGNOSIS — R209 Unspecified disturbances of skin sensation: Secondary | ICD-10-CM | POA: Diagnosis not present

## 2017-10-08 DIAGNOSIS — R2681 Unsteadiness on feet: Secondary | ICD-10-CM | POA: Insufficient documentation

## 2017-10-08 DIAGNOSIS — R293 Abnormal posture: Secondary | ICD-10-CM | POA: Insufficient documentation

## 2017-10-08 NOTE — Therapy (Signed)
[facility] [place], [place], [address] Phone: [phone]   Fax:  [phone]  Physical Therapy Treatment  Patient Details  Name: Penny Huynh MRN: [id number] Date of Birth: [birth year] Referring Provider: [name]    Encounter Date: 10/08/2017  PT End of Session - 10/08/17 1351    Visit Number  40   kx   Number of Visits  45    Date for PT Re-Evaluation  11/28/17    PT Start Time  1300    PT Stop Time  1345    PT Time Calculation (min)  45 min    Activity Tolerance  Patient tolerated treatment well    Behavior During Therapy  [facility] for tasks assessed/performed       Past Medical History:  Diagnosis Date  . Arthritis    "maybe a little in my left knee" (02/23/2017)  . Asthmatic bronchitis   . Cancer of left breast ([place])   . Coronary atherosclerosis    Minor nonobstructive CAD at cardiac catheterization 2009  . Dyslipidemia   . Femoral bruit 7/08   With normal ABIs  . Hypertension   . Lower extremity edema    Chronic  . Pneumonia ~ 1950   in 8th grade  . Type II diabetes mellitus ([place])     Past Surgical History:  Procedure Laterality Date  . APPENDECTOMY    . BREAST BIOPSY Left 02/2017  . CARDIAC CATHETERIZATION     "long time ago" (02/23/2017)  . MASTECTOMY COMPLETE / SIMPLE W/ SENTINEL NODE BIOPSY Left 02/23/2017   TOTAL MATECTOMY;  LEFT AXILLARY SENTINEL LYMPH NODE BIOPSY ERAS PATHWAY  . MASTECTOMY WITH RADIOACTIVE SEED GUIDED EXCISION AND AXILLARY SENTINEL LYMPH NODE BIOPSY Left 02/23/2017   Procedure: LEFT TOTAL MATECTOMY;  LEFT AXILLARY SENTINEL LYMPH NODE BIOPSY ERAS PATHWAY;  Surgeon: Excell Seltzer, MD;  Location: [facility];  Service: General;  Laterality: Left;  . PILONIDAL CYST EXCISION    . PORTA CATH INSERTION  02/23/2017  . PORTACATH PLACEMENT N/A 02/23/2017   Procedure: INSERTION PORT-A-CATH;  Surgeon: Excell Seltzer, MD;  Location: [facility];  Service: General;  Laterality: N/A;  .  SHOULDER ARTHROSCOPY W/ ROTATOR CUFF REPAIR Right     There were no vitals filed for this visit.  Subjective Assessment - 10/08/17 1258    Subjective  Ms. Cure states that she never had to use a cane prior to the chemo.  She did not  understand that there is nothing that can be done about her neuropathy.  She is continuing to do her arm exercises at home.       Pertinent History  Patient underwent a left mastectomy and sentinel node biopsy (6 negative nodes) on 02/23/17. She will begin chemotherapy 03/31/17 as long as drain is removed today. She has 2 different breast cancers. Both are ER/PR positive but one is HER2 negative and one is HER2 positive. History of right shoulder scope 20 years ago and has diabetes and hypertension, both of which are well controlled. 09/28/2017: pt has completed chemo and radiation completed 09/03/2017.  She is still getting herceptin infusions every 3 weeks.  She continues to struggle with chemotherapy induced peripheral neuropathy in her hands and feet.  Her hands feel cold and she has pain and numbness in her feet     Patient Stated Goals  to improve her balance and walking     Currently in Pain?  Yes    Pain Location  Foot  Pain Orientation  Right;Left    Pain Descriptors / Indicators  Burning;Sharp    Pain Type  Acute pain    Pain Onset  More than a month ago    Pain Frequency  Constant    Aggravating Factors   not sure     Pain Relieving Factors  not sure     Effect of Pain on Daily Activities  limits                             Balance Exercises - 10/08/17 1309      Balance Exercises: Standing   Tandem Stance  Eyes open;5 reps   with head turns    SLS  Eyes open;4 reps    Wall Bumps  Shoulder;Hip    Wall Bumps-Shoulders  Eyes opened;10 reps    Wall Bumps-Hips  Eyes opened;10 reps    Rockerboard  Lateral   2 minutes    Balance Master: Limits for Stability  1.24    Balance Master: Dynamic  2:00    Tandem Gait   Forward;Retro;Foam/compliant surface;1 rep    Sidestepping  Foam/compliant support;1 rep          PT Short Term Goals - 09/28/17 1518      PT SHORT TERM GOAL #1   Title  Pt will decrease TUG score to 13 sec indicating an improvment in functional balance with decrased risk for fall     Baseline  14.14    Time  4    Period  Weeks    Status  New        PT Long Term Goals - 09/28/17 1519      PT LONG TERM GOAL #1   Title  Pt will decrease TUG score to 12 indicating decreased risk for falls.     Baseline  14.14    Time  8    Period  Weeks    Status  New      PT LONG TERM GOAL #2   Title  Pt will be independent in  a home exercise program for balance improvment     Time  8    Period  Weeks    Status  New      PT LONG TERM GOAL #3   Title  Pt will report he feet pain is decreased to 7/10 at times     Baseline  10+/10    Time  8    Period  Weeks    Status  New      PT LONG TERM GOAL #4   Title  Pt will report she feels an improvment in her walking and balance and that she is more comfortable with walking during outings in her daily life     Time  8    Period  Weeks    Status  New            Plan - 10/08/17 1353    Clinical Impression Statement  PT educated that she had maxed out her medicare benefit.  Pt also thought that therapy could assist in decreasing the pain associated with neuropathy.  Pt educated that therapy could not assist in the pain.  Due to Berg balance decreasing we will see pt for a total of 6 visits to educate pt in higher balance activiites that she can be doing at home.  Recommended SLS with no hands at this time.     Rehab  Potential  Good    Clinical Impairments Affecting Rehab Potential  chemotherapy induced peripheral neuropathy, pt states she goes to a community exercise program     PT Frequency  2x / week    PT Duration  8 weeks    PT Treatment/Interventions  ADLs/Self Care Home Management;Neuromuscular re-education;Gait  training;Electrical Stimulation;Contrast Bath;DME Instruction;Moist Heat;Functional mobility training;Therapeutic activities;Therapeutic exercise;Balance training;Patient/family education;Manual techniques;Taping;Passive range of motion    PT Next Visit Plan  Review home program for hip strength and balance.  Cont higher level balance activities and LE strength.     Consulted and Agree with Plan of Care  Patient       Patient will benefit from skilled therapeutic intervention in order to improve the following deficits and impairments:  Abnormal gait, Decreased knowledge of use of DME, Decreased skin integrity, Pain, Postural dysfunction, Decreased mobility, Decreased activity tolerance, Decreased endurance, Difficulty walking, Decreased balance  Visit Diagnosis: Unsteadiness on feet  Unspecified disturbances of skin sensation     Problem List Patient Active Problem List   Diagnosis Date Noted  . Chemotherapy-induced peripheral neuropathy ([place]) 09/03/2017  . Port-A-Cath in place 04/14/2017  . Stage II breast cancer, left ([place]) 02/23/2017  . Carcinoma of upper-outer quadrant of left breast in female, estrogen receptor positive ([place]) 02/10/2017  . Carcinoma of upper-inner quadrant of left breast in female, estrogen receptor positive ([place]) 02/10/2017  . Chest pain 07/18/2015  . Hypokalemia 07/18/2015  . Vertigo 07/18/2015  . LBBB (left bundle branch block) 05/28/2009  . AODM 12/21/2007  . Essential hypertension, benign 12/21/2007  . DIASTOLIC DYSFUNCTION 71/29/2909  . FEMORAL BRUIT, RIGHT 12/21/2007  . DYSPNEA 12/21/2007    Rayetta Humphrey, PT CLT [phone] 10/08/2017, 1:56 PM  [facility] [address] [place], [place], [address] Phone: [phone]   Fax:  [phone]  Name: Penny Huynh MRN: [id number] Date of Birth: Feb 02, 1934

## 2017-10-11 ENCOUNTER — Ambulatory Visit: Payer: Medicare Other | Admitting: Rehabilitation

## 2017-10-11 ENCOUNTER — Ambulatory Visit (HOSPITAL_COMMUNITY): Payer: Medicare Other | Admitting: Physical Therapy

## 2017-10-11 DIAGNOSIS — R2681 Unsteadiness on feet: Secondary | ICD-10-CM

## 2017-10-11 DIAGNOSIS — R209 Unspecified disturbances of skin sensation: Secondary | ICD-10-CM | POA: Diagnosis not present

## 2017-10-11 DIAGNOSIS — R293 Abnormal posture: Secondary | ICD-10-CM | POA: Diagnosis not present

## 2017-10-11 DIAGNOSIS — Z483 Aftercare following surgery for neoplasm: Secondary | ICD-10-CM

## 2017-10-11 NOTE — Therapy (Signed)
Penny Huynh, [place], [address] Phone: [phone]   Fax:  [phone]  Physical Therapy Treatment  Patient Details  Name: Penny Huynh MRN: [id number] Date of Birth: Nov 26, 1933 Referring Provider: [name]    Encounter Date: 10/11/2017  PT End of Session - 10/11/17 1003    Visit Number  15   kx   Number of Visits  45    Date for PT Re-Evaluation  11/28/17    PT Start Time  0900    PT Stop Time  0945    PT Time Calculation (min)  45 min    Activity Tolerance  Patient tolerated treatment well    Behavior During Therapy  [facility] for tasks assessed/performed       Past Medical History:  Diagnosis Date  . Arthritis    "maybe a little in my left knee" (02/23/2017)  . Asthmatic bronchitis   . Cancer of left breast ([place])   . Coronary atherosclerosis    Minor nonobstructive CAD at cardiac catheterization 2009  . Dyslipidemia   . Femoral bruit 7/08   With normal ABIs  . Hypertension   . Lower extremity edema    Chronic  . Pneumonia ~ 1950   in 8th grade  . Type II diabetes mellitus ([place])     Past Surgical History:  Procedure Laterality Date  . APPENDECTOMY    . BREAST BIOPSY Left 02/2017  . CARDIAC CATHETERIZATION     "long time ago" (02/23/2017)  . MASTECTOMY COMPLETE / SIMPLE W/ SENTINEL NODE BIOPSY Left 02/23/2017   TOTAL MATECTOMY;  LEFT AXILLARY SENTINEL LYMPH NODE BIOPSY ERAS PATHWAY  . MASTECTOMY WITH RADIOACTIVE SEED GUIDED EXCISION AND AXILLARY SENTINEL LYMPH NODE BIOPSY Left 02/23/2017   Procedure: LEFT TOTAL MATECTOMY;  LEFT AXILLARY SENTINEL LYMPH NODE BIOPSY ERAS PATHWAY;  Surgeon: Excell Seltzer, MD;  Location: [facility];  Service: General;  Laterality: Left;  . PILONIDAL CYST EXCISION    . PORTA CATH INSERTION  02/23/2017  . PORTACATH PLACEMENT N/A 02/23/2017   Procedure: INSERTION PORT-A-CATH;  Surgeon: Excell Seltzer, MD;  Location: [facility];  Service: General;  Laterality: N/A;  .  SHOULDER ARTHROSCOPY W/ ROTATOR CUFF REPAIR Right     There were no vitals filed for this visit.  Subjective Assessment - 10/11/17 0948    Subjective  Pt states her legs are a little sore from last session.  Pt states the therapy here is more challenging on her balance.    Currently in Pain?  No/denies                       [facility] Adult PT Treatment/Exercise - 10/11/17 0001      Knee/Hip Exercises: Standing   Heel Raises  Both;20 reps    Hip Extension  Both;15 reps    SLS  5X max Rt: 12", Lt: 8"    Other Standing Knee Exercises  Bil tandem stance on foam with UE flexion using 1# bar          Balance Exercises - 10/11/17 0921      Balance Exercises: Standing   SLS with Vectors  Solid surface;5 reps;Limitations   one UE assist, 3" holds   Wall Bumps-Shoulders  Eyes opened;10 reps    Wall Bumps-Hips  Eyes opened;10 reps    Tandem Gait  2 reps   on blue line   Retro Gait  2 reps   on blue line  Sidestepping  Theraband;2 reps   RTB         PT Short Term Goals - 09/28/17 1518      PT SHORT TERM GOAL #1   Title  Pt will decrease TUG score to 13 sec indicating an improvment in functional balance with decrased risk for fall     Baseline  14.14    Time  4    Period  Weeks    Status  New        PT Long Term Goals - 09/28/17 1519      PT LONG TERM GOAL #1   Title  Pt will decrease TUG score to 12 indicating decreased risk for falls.     Baseline  14.14    Time  8    Period  Weeks    Status  New      PT LONG TERM GOAL #2   Title  Pt will be independent in  a home exercise program for balance improvment     Time  8    Period  Weeks    Status  New      PT LONG TERM GOAL #3   Title  Pt will report he feet pain is decreased to 7/10 at times     Baseline  10+/10    Time  8    Period  Weeks    Status  New      PT LONG TERM GOAL #4   Title  Pt will report she feels an improvment in her walking and balance and that she is more comfortable with  walking during outings in her daily life     Time  8    Period  Weeks    Status  New            Plan - 10/11/17 1004    Clinical Impression Statement  continued with focus on dynamic balance.  Tandem gait on solid surface most challengeing requiring min assist to re-establish balance when lost.  Added UE flexion at parallel bars with static tandem balance on foam also with noted challenge.  Good control with hip/shoulder wall bumps.  Noted fatigue at end of session.    Rehab Potential  Good    Clinical Impairments Affecting Rehab Potential  chemotherapy induced peripheral neuropathy, pt states she goes to a community exercise program     PT Frequency  2x / week    PT Duration  8 weeks    PT Treatment/Interventions  ADLs/Self Care Home Management;Neuromuscular re-education;Gait training;Electrical Stimulation;Contrast Bath;DME Instruction;Moist Heat;Functional mobility training;Therapeutic activities;Therapeutic exercise;Balance training;Patient/family education;Manual techniques;Taping;Passive range of motion    PT Next Visit Plan  Cont 4 more visits for higher level balance activities and LE strength.     Consulted and Agree with Plan of Care  Patient       Patient will benefit from skilled therapeutic intervention in order to improve the following deficits and impairments:  Abnormal gait, Decreased knowledge of use of DME, Decreased skin integrity, Pain, Postural dysfunction, Decreased mobility, Decreased activity tolerance, Decreased endurance, Difficulty walking, Decreased balance  Visit Diagnosis: Unsteadiness on feet  Unspecified disturbances of skin sensation  Abnormal posture  Aftercare following surgery for neoplasm     Problem List Patient Active Problem List   Diagnosis Date Noted  . Chemotherapy-induced peripheral neuropathy ([place]) 09/03/2017  . Port-A-Cath in place 04/14/2017  . Stage II breast cancer, left ([place]) 02/23/2017  . Carcinoma of upper-outer quadrant  of left breast in  female, estrogen receptor positive ([place]) 02/10/2017  . Carcinoma of upper-inner quadrant of left breast in female, estrogen receptor positive ([place]) 02/10/2017  . Chest pain 07/18/2015  . Hypokalemia 07/18/2015  . Vertigo 07/18/2015  . LBBB (left bundle branch block) 05/28/2009  . AODM 12/21/2007  . Essential hypertension, benign 12/21/2007  . DIASTOLIC DYSFUNCTION 16/11/9602  . FEMORAL BRUIT, RIGHT 12/21/2007  . DYSPNEA 12/21/2007   Teena Irani, PTA/CLT [phone]  Teena Irani 10/11/2017, 10:20 AM  [facility], [place], [address] Phone: [phone]   Fax:  [phone]  Name: NICKOLETTE ESPINOLA MRN: [id number] Date of Birth: [birth year]

## 2017-10-13 ENCOUNTER — Telehealth: Payer: Self-pay | Admitting: Nurse Practitioner

## 2017-10-13 ENCOUNTER — Inpatient Hospital Stay: Payer: Medicare Other | Attending: Nurse Practitioner

## 2017-10-13 ENCOUNTER — Inpatient Hospital Stay: Payer: Medicare Other

## 2017-10-13 ENCOUNTER — Inpatient Hospital Stay (HOSPITAL_BASED_OUTPATIENT_CLINIC_OR_DEPARTMENT_OTHER): Payer: Medicare Other | Admitting: Nurse Practitioner

## 2017-10-13 ENCOUNTER — Encounter: Payer: Self-pay | Admitting: Nurse Practitioner

## 2017-10-13 VITALS — BP 133/71 | HR 82 | Temp 98.2°F | Resp 18 | Ht 64.5 in | Wt 150.9 lb

## 2017-10-13 DIAGNOSIS — Z95828 Presence of other vascular implants and grafts: Secondary | ICD-10-CM

## 2017-10-13 DIAGNOSIS — C50412 Malignant neoplasm of upper-outer quadrant of left female breast: Secondary | ICD-10-CM

## 2017-10-13 DIAGNOSIS — C50212 Malignant neoplasm of upper-inner quadrant of left female breast: Secondary | ICD-10-CM | POA: Diagnosis not present

## 2017-10-13 DIAGNOSIS — Z9012 Acquired absence of left breast and nipple: Secondary | ICD-10-CM

## 2017-10-13 DIAGNOSIS — Z794 Long term (current) use of insulin: Secondary | ICD-10-CM | POA: Diagnosis not present

## 2017-10-13 DIAGNOSIS — K5900 Constipation, unspecified: Secondary | ICD-10-CM | POA: Insufficient documentation

## 2017-10-13 DIAGNOSIS — Z170 Estrogen receptor positive status [ER+]: Secondary | ICD-10-CM

## 2017-10-13 DIAGNOSIS — Z79899 Other long term (current) drug therapy: Secondary | ICD-10-CM | POA: Insufficient documentation

## 2017-10-13 DIAGNOSIS — Z923 Personal history of irradiation: Secondary | ICD-10-CM

## 2017-10-13 DIAGNOSIS — E876 Hypokalemia: Secondary | ICD-10-CM | POA: Diagnosis not present

## 2017-10-13 DIAGNOSIS — E871 Hypo-osmolality and hyponatremia: Secondary | ICD-10-CM | POA: Insufficient documentation

## 2017-10-13 DIAGNOSIS — Z5111 Encounter for antineoplastic chemotherapy: Secondary | ICD-10-CM | POA: Diagnosis not present

## 2017-10-13 DIAGNOSIS — E1142 Type 2 diabetes mellitus with diabetic polyneuropathy: Secondary | ICD-10-CM | POA: Insufficient documentation

## 2017-10-13 DIAGNOSIS — T451X5A Adverse effect of antineoplastic and immunosuppressive drugs, initial encounter: Secondary | ICD-10-CM

## 2017-10-13 DIAGNOSIS — I10 Essential (primary) hypertension: Secondary | ICD-10-CM

## 2017-10-13 DIAGNOSIS — G620 Drug-induced polyneuropathy: Secondary | ICD-10-CM

## 2017-10-13 LAB — CBC WITH DIFFERENTIAL (CANCER CENTER ONLY)
Basophils Absolute: 0 10*3/uL (ref 0.0–0.1)
Basophils Relative: 1 %
EOS PCT: 6 %
Eosinophils Absolute: 0.2 10*3/uL (ref 0.0–0.5)
HCT: 30.7 % — ABNORMAL LOW (ref 34.8–46.6)
Hemoglobin: 10 g/dL — ABNORMAL LOW (ref 11.6–15.9)
LYMPHS ABS: 0.8 10*3/uL — AB (ref 0.9–3.3)
Lymphocytes Relative: 23 %
MCH: 28.4 pg (ref 25.1–34.0)
MCHC: 32.5 g/dL (ref 31.5–36.0)
MCV: 87.4 fL (ref 79.5–101.0)
Monocytes Absolute: 0.5 10*3/uL (ref 0.1–0.9)
Monocytes Relative: 14 %
Neutro Abs: 1.9 10*3/uL (ref 1.5–6.5)
Neutrophils Relative %: 56 %
Platelet Count: 303 10*3/uL (ref 145–400)
RBC: 3.52 MIL/uL — AB (ref 3.70–5.45)
RDW: 14.5 % (ref 11.2–14.5)
WBC: 3.4 10*3/uL — AB (ref 3.9–10.3)

## 2017-10-13 LAB — CMP (CANCER CENTER ONLY)
ALBUMIN: 3.1 g/dL — AB (ref 3.5–5.0)
ALT: 20 U/L (ref 0–44)
AST: 26 U/L (ref 15–41)
Alkaline Phosphatase: 59 U/L (ref 38–126)
Anion gap: 10 (ref 5–15)
BUN: 10 mg/dL (ref 8–23)
CO2: 26 mmol/L (ref 22–32)
Calcium: 9.4 mg/dL (ref 8.9–10.3)
Chloride: 97 mmol/L — ABNORMAL LOW (ref 98–111)
Creatinine: 0.73 mg/dL (ref 0.44–1.00)
GFR, Est AFR Am: >60 mL/min (ref >60)
Glucose, Bld: 103 mg/dL — ABNORMAL HIGH (ref 70–99)
POTASSIUM: 3.9 mmol/L (ref 3.5–5.1)
Sodium: 133 mmol/L — ABNORMAL LOW (ref 135–145)
Total Bilirubin: 0.5 mg/dL (ref 0.3–1.2)
Total Protein: 6.7 g/dL (ref 6.5–8.1)

## 2017-10-13 MED ORDER — SODIUM CHLORIDE 0.9 % IV SOLN
Freq: Once | INTRAVENOUS | Status: AC
  Administered 2017-10-13: 15:00:00 via INTRAVENOUS
  Filled 2017-10-13: qty 250

## 2017-10-13 MED ORDER — SODIUM CHLORIDE 0.9% FLUSH
10.0000 mL | INTRAVENOUS | Status: DC | PRN
  Administered 2017-10-13: 10 mL
  Filled 2017-10-13: qty 10

## 2017-10-13 MED ORDER — DIPHENHYDRAMINE HCL 25 MG PO CAPS
ORAL_CAPSULE | ORAL | Status: AC
  Filled 2017-10-13: qty 2

## 2017-10-13 MED ORDER — ACETAMINOPHEN 325 MG PO TABS
ORAL_TABLET | ORAL | Status: AC
  Filled 2017-10-13: qty 2

## 2017-10-13 MED ORDER — TRASTUZUMAB CHEMO 150 MG IV SOLR
6.0000 mg/kg | Freq: Once | INTRAVENOUS | Status: AC
  Administered 2017-10-13: 378 mg via INTRAVENOUS
  Filled 2017-10-13: qty 18

## 2017-10-13 MED ORDER — HEPARIN SOD (PORK) LOCK FLUSH 100 UNIT/ML IV SOLN
500.0000 [IU] | Freq: Once | INTRAVENOUS | Status: AC | PRN
  Administered 2017-10-13: 500 [IU]
  Filled 2017-10-13: qty 5

## 2017-10-13 MED ORDER — ACETAMINOPHEN 325 MG PO TABS
650.0000 mg | ORAL_TABLET | Freq: Once | ORAL | Status: AC
  Administered 2017-10-13: 650 mg via ORAL

## 2017-10-13 MED ORDER — DIPHENHYDRAMINE HCL 25 MG PO CAPS
50.0000 mg | ORAL_CAPSULE | Freq: Once | ORAL | Status: AC
  Administered 2017-10-13: 50 mg via ORAL

## 2017-10-13 NOTE — Patient Instructions (Signed)
[facility] Discharge Instructions for Patients Receiving Chemotherapy  Today you received the following chemotherapy agents herceptin   To help prevent nausea and vomiting after your treatment, we encourage you to take your nausea medication as directed   If you develop nausea and vomiting that is not controlled by your nausea medication, call the clinic.   BELOW ARE SYMPTOMS THAT SHOULD BE REPORTED IMMEDIATELY:  *FEVER GREATER THAN 100.5 F  *CHILLS WITH OR WITHOUT FEVER  NAUSEA AND VOMITING THAT IS NOT CONTROLLED WITH YOUR NAUSEA MEDICATION  *UNUSUAL SHORTNESS OF BREATH  *UNUSUAL BRUISING OR BLEEDING  TENDERNESS IN MOUTH AND THROAT WITH OR WITHOUT PRESENCE OF ULCERS  *URINARY PROBLEMS  *BOWEL PROBLEMS  UNUSUAL RASH Items with * indicate a potential emergency and should be followed up as soon as possible.  Feel free to call the clinic you have any questions or concerns. The clinic phone number is [phone].  

## 2017-10-13 NOTE — Telephone Encounter (Signed)
Scheduled appt per 9/11 los - pt to get an updated schedule next visit.

## 2017-10-13 NOTE — Progress Notes (Signed)
[place]  [phone] [phone]  Clinic Follow up Note   Patient Care Team: Sasser, Silvestre Moment, MD as PCP - General (Cardiology) [name], MD as PCP - Cardiology (Cardiology) Truitt Merle, MD as Consulting Physician (Hematology) Excell Seltzer, MD as Consulting Physician (General Surgery) Alla Feeling, NP as Nurse Practitioner (Nurse Practitioner) [name], MD as Attending Physician (Cardiology) 10/13/2017  SUMMARY OF ONCOLOGIC HISTORY: Oncology History   Cancer Staging Carcinoma of upper-inner quadrant of left breast in female, estrogen receptor positive ([place]) Staging form: Breast, AJCC 8th Edition - Clinical stage from 01/18/2017: Stage IB (cT2, cN0, cM0, G3, ER: Positive, PR: Positive, HER2: Positive) - Signed by Truitt Merle, MD on 02/10/2017 - Pathologic stage from 02/23/2017: Stage IB (pT3, pN0, cM0, G3, ER: Positive, PR: Positive, HER2: Positive) - Signed by Alla Feeling, NP on 03/16/2017  Carcinoma of upper-outer quadrant of left breast in female, estrogen receptor positive ([place]) Staging form: Breast, AJCC 8th Edition - Clinical stage from 01/18/2017: Stage IB (cT2, cN0, cM0, G2, ER: Positive, PR: Positive, HER2: Negative) - Unsigned - Pathologic stage from 02/23/2017: Stage IB (pT2, pN0(sn), cM0, G3, ER: Positive, PR: Positive, HER2: Negative) - Signed by Alla Feeling, NP on 03/16/2017       Carcinoma of upper-outer quadrant of left breast in female, estrogen receptor positive ([place])   01/13/2017 Mammogram    FINDINGS: In the left breast, a spiculated mass lies in the upper outer quadrant. In the medial posterior left breast, there is a larger lobulated mass. These masses correspond to the palpable abnormalities. No other discrete left breast masses.  In the right breast, there are no discrete masses. There are no areas of architectural distortion.  In both breasts there are scattered calcifications,  rounded punctate, with a few other larger coarse dystrophic calcifications, without significant change from the previous screening mammogram, which is dated 12/04/2009.  Mammographic images were processed with CAD.  On physical exam, there is a firm palpable mass in the upper outer left breast and another firm mass in the medial left breast.     01/13/2017 Breast US    Targeted ultrasound is performed, showing an irregular hypoechoic shadowing mass in the left breast at the 2:30 o'clock position, 5 cm the nipple, measuring 3.3 x 2.8 x 2.6 cm. In the 10:30 o'clock position of the left breast, 5 cm the nipple, there is irregular hypoechoic mass with somewhat more lobulated margins, corresponding to the lobulated mass seen mammographically. This measures 4.8 x 2.9 x 4.8 cm. Both masses show internal vascularity on color Doppler analysis.  Sonographic evaluation of the left axilla shows several nodes with thickened cortices. Status cortex measured is 5 mm. None of these lymph nodes appear enlarged but overall size criteria.  IMPRESSION: 1. Two masses in the left breast, 1 at the 2:30 o'clock position in the other at the 10:30 o'clock position, both highly suspicious for breast carcinoma. 2. Several abnormal left axillary lymph nodes with thickened cortices. 3. No evidence of right breast malignancy.     01/18/2017 Pathology Results    Diagnosis 1. Breast, left, needle core biopsy, 2:30 o'clock - INVASIVE MAMMARY CARCINOMA - MAMMARY CARCINOMA IN-SITU - SEE COMMENT 2. Lymph node, needle/core biopsy, left axilla - NO CARCINOMA IDENTIFIED - SEE COMMENT 3. Breast, left, needle core biopsy, 10:30 o'clock - INVASIVE DUCTAL CARCINOMA - SEE COMMENT  1. PROGNOSTIC INDICATORS Results: IMMUNOHISTOCHEMICAL AND MORPHOMETRIC ANALYSIS PERFORMED MANUALLY Estrogen Receptor: 100%, POSITIVE, STRONG STAINING  INTENSITY Progesterone Receptor: 60%, POSITIVE, STRONG STAINING  INTENSITY Proliferation Marker Ki67: 60%  1. FLUORESCENCE IN-SITU HYBRIDIZATION Results: HER2 - NEGATIVE RATIO OF HER2/CEP17 SIGNALS 1.28 AVERAGE HER2 COPY NUMBER PER CELL 2.75  3. PROGNOSTIC INDICATORS Results: IMMUNOHISTOCHEMICAL AND MORPHOMETRIC ANALYSIS PERFORMED MANUALLY Estrogen Receptor: 100%, POSITIVE, STRONG STAINING INTENSITY Progesterone Receptor: 50%, POSITIVE, STRONG STAINING INTENSITY Proliferation Marker Ki67: 70%  3. FLUORESCENCE IN-SITU HYBRIDIZATION Results: HER2 - **POSITIVE** RATIO OF HER2/CEP17 SIGNALS 2.80 AVERAGE HER2 COPY NUMBER PER CELL 6.15  Microscopic Comment 1. The biopsy material shows an infiltrative proliferation of cells with large vesicular nuclei with inconspicuous nucleoli, arranged linearly and in small clusters. Based on the biopsy, the carcinoma appears Nottingham grade 2 of 3 and measures 0.9 cm in greatest linear extent. E-cadherin and prognostic markers (ER/PR/ki-67/HER2-FISH)are pending and will be reported in an addendum. Dr. Lyndon Code reviewed the case and agrees with the above diagnosis.    02/10/2017 Initial Diagnosis    Carcinoma of upper-outer quadrant of left breast in female, estrogen receptor positive ([name])    02/19/2017 Imaging    Bone Scan whole Body 02/19/17 IMPRESSION: Negative for evidence of bony metastatic disease.    02/19/2017 Imaging    CT CAP W Contrast 02/19/17 IMPRESSION: 1. Small right middle lobe pulmonary nodules up to 0.8 cm, nonspecific. Non-contrast chest CT at 3-6 months is recommended. If the nodules are stable at time of repeat CT, then future CT at 18-24 months (from today's scan) is considered optional for low-risk patients, but is recommended for high-risk patients. This recommendation follows the consensus statement: Guidelines for Management of Incidental Pulmonary Nodules Detected on CT Images: From the Fleischner Society 2017; Radiology 2017; 284:228-243. 2. Subcentimeter hepatic lesions likely cysts  but incompletely characterized due to small size. Attention on follow-up imaging recommended. 3. 2.1 cm fusiform right common iliac artery aneurysm. Continued surveillance recommended. 4. Aortic Atherosclerosis (ICD10-I70.0) and Emphysema (ICD10-J43.9).    02/23/2017 Pathology Results    Diagnosis 1. Breast, simple mastectomy, Left Total - INVASIVE DUCTAL CARCINOMA, MULTIFOCAL, NOTTINGHAM GRADE 3/3 (5.3 CM, 3.5 CM) - INVASIVE CARCINOMA INVOLVES THE DERMIS - DUCTAL CARCINOMA IN SITU, INTERMEDIATE GRADE - HYALINIZED FIBROADENOMA - MARGINS UNINVOLVED BY CARCINOMA - NO CARCINOMA IDENTIFIED IN TWO LYMPH NODES (0/2) - SEE ONCOLOGY TABLE AND COMMENT BELOW 2. Lymph node, sentinel, biopsy, Left Axillary - NO CARCINOMA IDENTIFIED IN ONE LYMPH NODE (0/1) 3. Lymph node, sentinel, biopsy - NO CARCINOMA IDENTIFIED IN ONE LYMPH NODE (0/1) 4. Lymph node, sentinel, biopsy - NO CARCINOMA IDENTIFIED IN ONE LYMPH NODE (0/1) 5. Lymph node, sentinel, biopsy - NO CARCINOMA IDENTIFIED IN ONE LYMPH NODE (0/1) Microscopic Comment 1. BREAST, INVASIVE TUMOR Procedure: Simple mastectomy Laterality: Left Tumor Size: 5. 3 cm, 3.5 cm Histologic Type: Invasive carcinoma of no special type (ductal, not otherwise specified) Grade: Nottingham Grade 3 Tubular Differentiation: 3 Nuclear Pleomorphism: 3 Mitotic Count: 2 Ductal Carcinoma in Situ (DCIS): Present Extent of Tumor: Skin: Invasive carcinoma directly invades into the dermis or epidermis without skin ulceration Margins: Invasive carcinoma, distance from closest margin: 1.5 cm (posterior) DCIS, distance from closest margin: > 1 cm Regional Lymph Nodes: Number of Lymph Nodes Examined: 6 Number of Sentinel Lymph Nodes Examined: 4 Lymph Nodes with Macrometastases: 0 Lymph Nodes with Micrometastases: 0 Lymph Nodes with Isolated Tumor Cells: 0 Treatment effect: No known presurgical therapy Breast Prognostic Profile: See Also ([id number]) Estrogen  Receptor: Positive (100%, strong); Positive (100%, strong) Progesterone Receptor: Positive (50%, strong); Positive (50%, strong) Her2: Positive (Ratio 2.80); Negative (Ratio 1.28)  Ki-67: 70%; 60% Best tumor block for sendout testing: 1E Pathologic Stage Classification (pTNM, AJCC 8th Edition): Primary Tumor: mpT3 Regional Lymph Nodes: pN0 COMMENT: The two invasive carcinomas in the breast have slightly different morphologies. The larger lesion has a papillary and micropapillary pattern admixed with typical ductal carcinoma while the smaller lesion is more typical of a ductal lesion with linear arrays (E-cadherin performed on biopsy). There are foci within the larger lesion that are concerning for lymphovascular space invasion.     04/07/2017 -  Chemotherapy    weekly taxol and herceptin x12 weeks, 04/07/17-06/23/17. Then herceptin q3 weeks for total 1 year which was switched to Kadcyla on 07/14/17.    07/20/2017 - 09/03/2017 Radiation Therapy    She started adjuvant radiation on 07/20/17 with Dr. Lisbeth Renshaw and plans to complete on 09/03/17.     08/24/2017 -  Chemotherapy    maintenance Herceptin    CURRENT THERAPY:   -weekly taxol and herceptin x12 weeks, 04/07/17-06/23/17. Then herceptin q3 weeks for total 1 year which was switched to Kadcyla on 07/14/17, and switched back to maintenance Herceptin on September 02, 2017 due to worsening peripheral neuropathy.  INTERVAL HISTORY: Ms. Sautter returns for follow up and herceptin as scheduled. She received last cycle on 09/23/17. She saw Dr. Mickeal Skinner that day and gabapentin was increased to 900 mg TID for peripheral neuropathy. She has increased pain in her feet today, R>L. She increased sensitivity to touch especially when putting on socks and shoes. Wearing close toed shoes helps. She continues to go to PT for balance issues, which she feels is helping. She continues to have difficulty with certain tasks, such as buttoning clothes. Overall she feels her neuropathy is not  any better. Otherwise she is tolerating herceptin well. She denies fatigue or decreased appetite. Manages constipation with stool softener and prunes. Last BM 3 days ago. Denies n/v/d. She has mild ankle edema, saw cardiologist recently who won't see her back for 1 year. Denies fever, chills, cough, chest pain, or dyspnea. She began anastrozole and took 2 doses then stopped because she was unsure what it was for and thought pharmacy made a mistake.    MEDICAL HISTORY:  Past Medical History:  Diagnosis Date  . Arthritis    "maybe a little in my left knee" (02/23/2017)  . Asthmatic bronchitis   . Cancer of left breast ([place])   . Coronary atherosclerosis    Minor nonobstructive CAD at cardiac catheterization 2009  . Dyslipidemia   . Femoral bruit 7/08   With normal ABIs  . Hypertension   . Lower extremity edema    Chronic  . Pneumonia ~ 1950   in 8th grade  . Type II diabetes mellitus ([place])     SURGICAL HISTORY: Past Surgical History:  Procedure Laterality Date  . APPENDECTOMY    . BREAST BIOPSY Left 02/2017  . CARDIAC CATHETERIZATION     "long time ago" (02/23/2017)  . MASTECTOMY COMPLETE / SIMPLE W/ SENTINEL NODE BIOPSY Left 02/23/2017   TOTAL MATECTOMY;  LEFT AXILLARY SENTINEL LYMPH NODE BIOPSY ERAS PATHWAY  . MASTECTOMY WITH RADIOACTIVE SEED GUIDED EXCISION AND AXILLARY SENTINEL LYMPH NODE BIOPSY Left 02/23/2017   Procedure: LEFT TOTAL MATECTOMY;  LEFT AXILLARY SENTINEL LYMPH NODE BIOPSY ERAS PATHWAY;  Surgeon: Excell Seltzer, MD;  Location: [facility];  Service: General;  Laterality: Left;  . PILONIDAL CYST EXCISION    . PORTA CATH INSERTION  02/23/2017  . PORTACATH PLACEMENT N/A 02/23/2017   Procedure: INSERTION PORT-A-CATH;  Surgeon:  Excell Seltzer, MD;  Location: [facility];  Service: General;  Laterality: N/A;  . SHOULDER ARTHROSCOPY W/ ROTATOR CUFF REPAIR Right     I have reviewed the social history and family history with the patient and they are unchanged from previous  note.  ALLERGIES:  has No Known Allergies.  MEDICATIONS:  Current Outpatient Medications  Medication Sig Dispense Refill  . amitriptyline (ELAVIL) 50 MG tablet Take 1 tablet (50 mg total) by mouth at bedtime. 30 tablet 3  . amLODipine (NORVASC) 5 MG tablet Take 1 tablet (5 mg total) by mouth daily. 30 tablet 2  . anastrozole (ARIMIDEX) 1 MG tablet Take 1 tablet (1 mg total) by mouth daily. 30 tablet 1  . aspirin 81 MG EC tablet Take 81 mg by mouth at bedtime.     . chlorthalidone (HYGROTON) 25 MG tablet Take 25 mg by mouth daily.     . Coenzyme Q10 (COQ10) 100 MG CAPS Take 100 mg by mouth daily.      Marland Kitchen gabapentin (NEURONTIN) 300 MG capsule Take 3 capsules (900 mg total) by mouth 3 (three) times daily. 120 capsule 3  . HYDROcodone-acetaminophen (NORCO/VICODIN) 5-325 MG tablet Take 1 tablet by mouth every 6 (six) hours as needed for moderate pain. 40 tablet 0  . latanoprost (XALATAN) 0.005 % ophthalmic solution Place 1 drop into both eyes at bedtime.    . lidocaine-prilocaine (EMLA) cream APPLY CREAM TOPICALLY TO AFFECTED AREA ONCE  3  . losartan (COZAAR) 50 MG tablet Take 50 mg by mouth daily.    . metFORMIN (GLUCOPHAGE) 500 MG tablet Take 1,000 mg by mouth daily with breakfast.     . potassium chloride SA (K-DUR,KLOR-CON) 20 MEQ tablet Take 1 tablet (20 mEq total) by mouth 2 (two) times daily. 60 tablet 1  . vitamin B-12 (CYANOCOBALAMIN) 1000 MCG tablet Take 1,000 mcg by mouth daily.     No current facility-administered medications for this visit.    Facility-Administered Medications Ordered in Other Visits  Medication Dose Route Frequency Provider Last Rate Last Dose  . heparin lock flush 100 unit/mL  500 Units Intracatheter Once PRN Truitt Merle, MD      . sodium chloride flush (NS) 0.9 % injection 10 mL  10 mL Intracatheter PRN Truitt Merle, MD      . trastuzumab (HERCEPTIN) 378 mg in sodium chloride 0.9 % 250 mL chemo infusion  6 mg/kg (Treatment Plan Recorded) Intravenous Once Truitt Merle,  MD 536 mL/hr at 10/13/17 1549 378 mg at 10/13/17 1549    PHYSICAL EXAMINATION: ECOG PERFORMANCE STATUS: 1 - Symptomatic but completely ambulatory  Vitals:   10/13/17 1322  BP: 133/71  Pulse: 82  Resp: 18  Temp: 98.2 F (36.8 C)  SpO2: 98%   Filed Weights   10/13/17 1322  Weight: 150 lb 14.4 oz (68.4 kg)    GENERAL:alert, no distress and comfortable SKIN: skin color, texture, turgor are normal, no rashes or significant lesions EYES: sclera clear OROPHARYNX:no thrush or ulcers  LYMPH:  no palpable cervical, supraclavicular, or axillary lymphadenopathy LUNGS: clear to auscultation with normal breathing effort HEART: regular rate & rhythm, trace bilateral ankle edema  ABDOMEN:abdomen soft, non-tender and normal bowel sounds Musculoskeletal:no cyanosis of digits and no clubbing  NEURO: alert & oriented x 3 with fluent speech, no focal motor deficits. Moderately decreased vibratory sense to fingertips and toes per tuning fork exam  Breast: left breast s/p mastectomy. Incision is well healed. No nodularity along the incision or chest wall. Hyperpigmentation  secondary to radiation   LABORATORY DATA:  I have reviewed the data as listed CBC Latest Ref Rng & Units 10/13/2017 09/23/2017 09/02/2017  WBC 3.9 - 10.3 K/uL 3.4(L) 2.9(L) 2.3(L)  Hemoglobin 11.6 - 15.9 g/dL 10.0(L) 10.9(L) 11.2(L)  Hematocrit 34.8 - 46.6 % 30.7(L) 33.1(L) 33.4(L)  Platelets 145 - 400 K/uL 303 244 213     CMP Latest Ref Rng & Units 10/13/2017 09/23/2017 09/02/2017  Glucose 70 - 99 mg/dL 103(H) 103(H) 104(H)  BUN 8 - 23 mg/dL 10 10 7(L)  Creatinine 0.44 - 1.00 mg/dL 0.73 0.73 0.68  Sodium 135 - 145 mmol/L 133(L) 130(L) 128(L)  Potassium 3.5 - 5.1 mmol/L 3.9 3.8 3.3(L)  Chloride 98 - 111 mmol/L 97(L) 94(L) 91(L)  CO2 22 - 32 mmol/L _0 Calcium 8.9 - 10.3 mg/dL 9.4 9.7 9.8  Total Protein 6.5 - 8.1 g/dL 6.7 7.1 7.2  Total Bilirubin 0.3 - 1.2 mg/dL 0.5 0.5 0.7  Alkaline Phos 38 - 126 U/L 59 63 51  AST 15 -  41 U/L _1 ALT 0 - 44 U/L _2 RADIOGRAPHIC STUDIES: I have personally reviewed the radiological images as listed and agreed with the findings in the report. No results found.   ASSESSMENT & PLAN: Penny Huynh is an 84 y.o. female with past medical history positive for HTN, DM, CAD now with newly diagnosed left breast cancer  1.Multifocal invasive ductal carcinoma of the left breast of female;carcinoma of the upper-outer quadrant of left breast and female, ER/PR positive, HER-2 negative pT2N0M0, G2, Stage 1B; IDC of the upper-inner quadrant left breast, ER+/PR+/HER2+, pT3N0M0 G3, stage IB 2. HTN, CAD, DM -07/08/17 echo with EF 55%, due for next echo and will arrange with cardiology  3. Hyponatremia, hypokalemia  4. Peripheral neuropathy, G2-3, secondary to taxol, DM and T-DM1  Penny Huynh appears stable. She completed another cycle of herceptin on 09/23/17. She is tolerating treatment well. She continues to report mild constipation and moderate neuropathy. We reviewed symptom management for constipation; she will continue stool softener daily and add miralax 1-2 times daily PRN if no BM in 2 or more days. For neuropathy, she continues high dose gabapentin, amitriptyline, and PT. I recommend if no improvement after few weeks on increased gabapentin (increaed on 8/22), she will call Dr. Mickeal Skinner sooner for f/u. She agrees.   She only took 2 doses Anastrozole then stopped, due to uncertainty as to why she was taking it. I reviewed indication and she agrees to restart. She did not notice any side effects after a couple doses. Will monitor closely.   Labs reviewed, CBC and CMP adequate to proceed with treatment. She will be due for an echo after this cycle. Will arrange with cardiology. She will return in 3 weeks with next cycle.    Orders Placed This Encounter  Procedures  . ECHOCARDIOGRAM COMPLETE    Standing Status:   Future    Standing Expiration Date:   01/13/2019     Order Specific Question:   Where should this test be performed    Answer:   [facility]    Order Specific Question:   Perflutren DEFINITY (image enhancing agent) should be administered unless hypersensitivity or allergy exist    Answer:   Administer Perflutren    Order Specific Question:   Other Comments    Answer:   routine echo on herceptin therapy   All questions were answered. The patient knows to call  the clinic with any problems, questions or concerns. No barriers to learning was detected. I spent 20 minutes counseling the patient face to face. The total time spent in the appointment was 25 minutes and more than 50% was on counseling and review of test results     Alla Feeling, NP 10/13/17

## 2017-10-14 ENCOUNTER — Ambulatory Visit (HOSPITAL_COMMUNITY): Payer: Medicare Other | Admitting: Physical Therapy

## 2017-10-14 ENCOUNTER — Encounter (HOSPITAL_COMMUNITY): Payer: Self-pay | Admitting: Physical Therapy

## 2017-10-14 DIAGNOSIS — R209 Unspecified disturbances of skin sensation: Secondary | ICD-10-CM

## 2017-10-14 DIAGNOSIS — R2681 Unsteadiness on feet: Secondary | ICD-10-CM | POA: Diagnosis not present

## 2017-10-14 DIAGNOSIS — Z483 Aftercare following surgery for neoplasm: Secondary | ICD-10-CM | POA: Diagnosis not present

## 2017-10-14 DIAGNOSIS — R293 Abnormal posture: Secondary | ICD-10-CM | POA: Diagnosis not present

## 2017-10-14 NOTE — Therapy (Signed)
[place], [place], [address] Phone: [phone]   Fax:  [phone]  Physical Therapy Treatment  Patient Details  Name: Penny Huynh MRN: [id number] Date of Birth: [birth year] Referring Provider: [name]    Encounter Date: 10/14/2017  PT End of Session - 10/14/17 1204    Visit Number  63   kx   Number of Visits  45    Date for PT Re-Evaluation  11/28/17    PT Start Time  1116    PT Stop Time  1200    PT Time Calculation (min)  44 min    Equipment Utilized During Treatment  Gait belt    Activity Tolerance  Patient tolerated treatment well    Behavior During Therapy  [facility] for tasks assessed/performed       Past Medical History:  Diagnosis Date  . Arthritis    "maybe a little in my left knee" (02/23/2017)  . Asthmatic bronchitis   . Cancer of left breast ([place])   . Coronary atherosclerosis    Minor nonobstructive CAD at cardiac catheterization 2009  . Dyslipidemia   . Femoral bruit 7/08   With normal ABIs  . Hypertension   . Lower extremity edema    Chronic  . Pneumonia ~ 1950   in 8th grade  . Type II diabetes mellitus ([place])     Past Surgical History:  Procedure Laterality Date  . APPENDECTOMY    . BREAST BIOPSY Left 02/2017  . CARDIAC CATHETERIZATION     "long time ago" (02/23/2017)  . MASTECTOMY COMPLETE / SIMPLE W/ SENTINEL NODE BIOPSY Left 02/23/2017   TOTAL MATECTOMY;  LEFT AXILLARY SENTINEL LYMPH NODE BIOPSY ERAS PATHWAY  . MASTECTOMY WITH RADIOACTIVE SEED GUIDED EXCISION AND AXILLARY SENTINEL LYMPH NODE BIOPSY Left 02/23/2017   Procedure: LEFT TOTAL MATECTOMY;  LEFT AXILLARY SENTINEL LYMPH NODE BIOPSY ERAS PATHWAY;  Surgeon: Excell Seltzer, MD;  Location: [place];  Service: General;  Laterality: Left;  . PILONIDAL CYST EXCISION    . PORTA CATH INSERTION  02/23/2017  . PORTACATH PLACEMENT N/A 02/23/2017   Procedure: INSERTION PORT-A-CATH;  Surgeon: Excell Seltzer, MD;  Location: [place];  Service: General;  Laterality: N/A;  . SHOULDER ARTHROSCOPY W/ ROTATOR CUFF REPAIR Right     There were no vitals filed for this visit.  Subjective Assessment - 10/14/17 1118    Subjective  Patient stated her feet are bothering her and she reported that she saw her physician yesterday. She stated she feels she has regressed with her foot pain.     Currently in Pain?  Yes    Pain Score  8     Pain Location  Foot    Pain Orientation  Right;Left    Pain Descriptors / Indicators  Burning    Pain Type  Acute pain                       [facility] Adult PT Treatment/Exercise - 10/14/17 0001      Knee/Hip Exercises: Standing   Heel Raises  Both;20 reps    Hip Extension  Both;15 reps    Other Standing Knee Exercises  Bil tandem stance on foam with UE flexion using 1# bar x 15 each leg forward           Balance Exercises - 10/14/17 1128      Balance Exercises: Standing   Standing Eyes Closed  Narrow base of support (BOS);Solid  surface;4 reps;30 secs    SLS with Vectors  Solid surface;5 reps;Limitations   One UE assist 3'' holds   Wall Bumps  Shoulder;Hip    Wall Bumps-Shoulders  Eyes opened;10 reps    Wall Bumps-Hips  Eyes opened;10 reps    Gait with Head Turns  Forward;2 reps;Other (comment)   Lateral head turns min Guard, 40 feet x 2   Step Over Hurdles / Cones  14 feet x 3 roundtrips stepping over 6'' hurdles        PT Education - 10/14/17 1203    Education provided  Yes    Education Details  Discussed purpose and technique of exercises throughout session.     Person(s) Educated  Patient    Methods  Explanation    Comprehension  Verbalized understanding       PT Short Term Goals - 09/28/17 1518      PT SHORT TERM GOAL #1   Title  Pt will decrease TUG score to 13 sec indicating an improvment in functional balance with decrased risk for fall     Baseline  14.14    Time  4    Period  Weeks    Status  New        PT Long Term Goals - 09/28/17 1519       PT LONG TERM GOAL #1   Title  Pt will decrease TUG score to 12 indicating decreased risk for falls.     Baseline  14.14    Time  8    Period  Weeks    Status  New      PT LONG TERM GOAL #2   Title  Pt will be independent in  a home exercise program for balance improvment     Time  8    Period  Weeks    Status  New      PT LONG TERM GOAL #3   Title  Pt will report he feet pain is decreased to 7/10 at times     Baseline  10+/10    Time  8    Period  Weeks    Status  New      PT LONG TERM GOAL #4   Title  Pt will report she feels an improvment in her walking and balance and that she is more comfortable with walking during outings in her daily life     Time  8    Period  Weeks    Status  New            Plan - 10/14/17 1205    Clinical Impression Statement  This session continued with progression of dynamic balance exercises. This session added stepping over hurdles, gait with head turns, and standing with NBOS with eyes closed. Patient reported that she feels her foot pain has increased and feels like in some ways she has regressed. Discussed with patient plans to add to HEP at next session and to discuss what equipment at home may help to work as a compliant surface.     Rehab Potential  Good    Clinical Impairments Affecting Rehab Potential  chemotherapy induced peripheral neuropathy, pt states she goes to a community exercise program     PT Frequency  2x / week    PT Duration  8 weeks    PT Treatment/Interventions  ADLs/Self Care Home Management;Neuromuscular re-education;Gait training;Electrical Stimulation;Contrast Bath;DME Instruction;Moist Heat;Functional mobility training;Therapeutic activities;Therapeutic exercise;Balance training;Patient/family education;Manual techniques;Taping;Passive range of motion  PT Next Visit Plan  Update HEP next session. Discuss possible compliant surface options for at home practice. Cont 4 more visits for higher level balance  activities and LE strength.     Consulted and Agree with Plan of Care  Patient       Patient will benefit from skilled therapeutic intervention in order to improve the following deficits and impairments:  Abnormal gait, Decreased knowledge of use of DME, Decreased skin integrity, Pain, Postural dysfunction, Decreased mobility, Decreased activity tolerance, Decreased endurance, Difficulty walking, Decreased balance  Visit Diagnosis: Unsteadiness on feet  Unspecified disturbances of skin sensation     Problem List Patient Active Problem List   Diagnosis Date Noted  . Chemotherapy-induced peripheral neuropathy ([place]) 09/03/2017  . Port-A-Cath in place 04/14/2017  . Stage II breast cancer, left ([place]) 02/23/2017  . Carcinoma of upper-outer quadrant of left breast in female, estrogen receptor positive ([place]) 02/10/2017  . Carcinoma of upper-inner quadrant of left breast in female, estrogen receptor positive ([place]) 02/10/2017  . Chest pain 07/18/2015  . Hypokalemia 07/18/2015  . Vertigo 07/18/2015  . LBBB (left bundle branch block) 05/28/2009  . AODM 12/21/2007  . Essential hypertension, benign 12/21/2007  . DIASTOLIC DYSFUNCTION 78/46/9629  . FEMORAL BRUIT, RIGHT 12/21/2007  . DYSPNEA 12/21/2007   Clarene Critchley PT, DPT 12:09 PM, 10/14/17 [place], [place], [address] Phone: [phone]   Fax:  [phone]  Name: Penny Huynh MRN: [id number] Date of Birth: 01-06-1934

## 2017-10-19 ENCOUNTER — Encounter (HOSPITAL_COMMUNITY): Payer: Self-pay

## 2017-10-19 ENCOUNTER — Other Ambulatory Visit: Payer: Self-pay | Admitting: Nurse Practitioner

## 2017-10-19 ENCOUNTER — Ambulatory Visit (HOSPITAL_COMMUNITY): Payer: Medicare Other

## 2017-10-19 DIAGNOSIS — R2681 Unsteadiness on feet: Secondary | ICD-10-CM

## 2017-10-19 DIAGNOSIS — R209 Unspecified disturbances of skin sensation: Secondary | ICD-10-CM | POA: Diagnosis not present

## 2017-10-19 DIAGNOSIS — Z170 Estrogen receptor positive status [ER+]: Principal | ICD-10-CM

## 2017-10-19 DIAGNOSIS — R293 Abnormal posture: Secondary | ICD-10-CM | POA: Diagnosis not present

## 2017-10-19 DIAGNOSIS — C50412 Malignant neoplasm of upper-outer quadrant of left female breast: Secondary | ICD-10-CM

## 2017-10-19 DIAGNOSIS — Z5111 Encounter for antineoplastic chemotherapy: Secondary | ICD-10-CM

## 2017-10-19 DIAGNOSIS — Z483 Aftercare following surgery for neoplasm: Secondary | ICD-10-CM | POA: Diagnosis not present

## 2017-10-19 NOTE — Therapy (Signed)
[place], [place], [address] Phone: [phone]   Fax:  [phone]  Physical Therapy Treatment  Patient Details  Name: Penny Huynh MRN: [id number] Date of Birth: 11-Aug-1933 Referring Provider: [name]    Encounter Date: 10/19/2017  PT End of Session - 10/19/17 1301    Visit Number  65   kx   Number of Visits  45    Date for PT Re-Evaluation  11/28/17    PT Start Time  1302    PT Stop Time  1344    PT Time Calculation (min)  42 min    Equipment Utilized During Treatment  Gait belt    Activity Tolerance  Patient tolerated treatment well    Behavior During Therapy  [facility] for tasks assessed/performed       Past Medical History:  Diagnosis Date  . Arthritis    "maybe a little in my left knee" (02/23/2017)  . Asthmatic bronchitis   . Cancer of left breast ([place])   . Coronary atherosclerosis    Minor nonobstructive CAD at cardiac catheterization 2009  . Dyslipidemia   . Femoral bruit 7/08   With normal ABIs  . Hypertension   . Lower extremity edema    Chronic  . Pneumonia ~ 1950   in 8th grade  . Type II diabetes mellitus ([place])     Past Surgical History:  Procedure Laterality Date  . APPENDECTOMY    . BREAST BIOPSY Left 02/2017  . CARDIAC CATHETERIZATION     "long time ago" (02/23/2017)  . MASTECTOMY COMPLETE / SIMPLE W/ SENTINEL NODE BIOPSY Left 02/23/2017   TOTAL MATECTOMY;  LEFT AXILLARY SENTINEL LYMPH NODE BIOPSY ERAS PATHWAY  . MASTECTOMY WITH RADIOACTIVE SEED GUIDED EXCISION AND AXILLARY SENTINEL LYMPH NODE BIOPSY Left 02/23/2017   Procedure: LEFT TOTAL MATECTOMY;  LEFT AXILLARY SENTINEL LYMPH NODE BIOPSY ERAS PATHWAY;  Surgeon: Excell Seltzer, MD;  Location: [place];  Service: General;  Laterality: Left;  . PILONIDAL CYST EXCISION    . PORTA CATH INSERTION  02/23/2017  . PORTACATH PLACEMENT N/A 02/23/2017   Procedure: INSERTION PORT-A-CATH;  Surgeon: Excell Seltzer, MD;  Location: [place];  Service: General;  Laterality: N/A;  . SHOULDER ARTHROSCOPY W/ ROTATOR CUFF REPAIR Right     There were no vitals filed for this visit.  Subjective Assessment - 10/19/17 1259    Subjective  Feet were pretty painful last night; not too bad now.     Pain Score  7     Pain Location  Foot    Pain Orientation  Right;Left    Pain Descriptors / Indicators  Burning                       [facility] Adult PT Treatment/Exercise - 10/19/17 0001      Knee/Hip Exercises: Standing   Heel Raises  Both;20 reps    Hip Extension  Both;15 reps    Other Standing Knee Exercises  Bil tandem stance on foam with UE flexion using 1# bar x 15 each leg forward           Balance Exercises - 10/19/17 1458      Balance Exercises: Standing   Standing Eyes Opened  Narrow base of support (BOS);2 reps;30 secs    Standing Eyes Closed  Narrow base of support (BOS);2 reps;30 secs    Tandem Stance  Eyes open;Eyes closed;Intermittent upper extremity support;2 reps;30 secs  SLS  Eyes open;Solid surface;Intermittent upper extremity support;2 reps;30 secs        PT Education - 10/19/17 1500    Education provided  Yes    Education Details  Advanced HEP, discussed purpose and technique of exercises throughout session.    Person(s) Educated  Patient    Methods  Explanation;Demonstration;Handout    Comprehension  Verbalized understanding       PT Short Term Goals - 09/28/17 1518      PT SHORT TERM GOAL #1   Title  Pt will decrease TUG score to 13 sec indicating an improvment in functional balance with decrased risk for fall     Baseline  14.14    Time  4    Period  Weeks    Status  New        PT Long Term Goals - 09/28/17 1519      PT LONG TERM GOAL #1   Title  Pt will decrease TUG score to 12 indicating decreased risk for falls.     Baseline  14.14    Time  8    Period  Weeks    Status  New      PT LONG TERM GOAL #2   Title  Pt will be independent in  a home exercise program  for balance improvment     Time  8    Period  Weeks    Status  New      PT LONG TERM GOAL #3   Title  Pt will report he feet pain is decreased to 7/10 at times     Baseline  10+/10    Time  8    Period  Weeks    Status  New      PT LONG TERM GOAL #4   Title  Pt will report she feels an improvment in her walking and balance and that she is more comfortable with walking during outings in her daily life     Time  8    Period  Weeks    Status  New            Plan - 10/19/17 1302    Clinical Impression Statement  Session focused on balance exercises and advancing HEP to include more balancing exercises in narrow base, semi-tandem, tandem and SLS with eyes open, eyes closed, and on solid surface as well as how to advance to faom compliant when appropriate for her skill level. Patient eager to learn more exercises to do at home to improve her balance.     Rehab Potential  Good    Clinical Impairments Affecting Rehab Potential  chemotherapy induced peripheral neuropathy, pt states she goes to a community exercise program     PT Frequency  2x / week    PT Duration  8 weeks    PT Treatment/Interventions  ADLs/Self Care Home Management;Neuromuscular re-education;Gait training;Electrical Stimulation;Contrast Bath;DME Instruction;Moist Heat;Functional mobility training;Therapeutic activities;Therapeutic exercise;Balance training;Patient/family education;Manual techniques;Taping;Passive range of motion    PT Next Visit Plan  Review HEP next session. Cont 2 more visits for higher level balance activities and LE strength.     PT Home Exercise Plan  10/19/2017 - balance - narrow base, semi-tandem, tandem and SLS on solid surface, as well as faom compliant when appropriate for her skill level.    Consulted and Agree with Plan of Care  Patient       Patient will benefit from skilled therapeutic intervention in order to improve the following  deficits and impairments:  Abnormal gait, Decreased  knowledge of use of DME, Decreased skin integrity, Pain, Postural dysfunction, Decreased mobility, Decreased activity tolerance, Decreased endurance, Difficulty walking, Decreased balance  Visit Diagnosis: Unsteadiness on feet  Unspecified disturbances of skin sensation  Abnormal posture     Problem List Patient Active Problem List   Diagnosis Date Noted  . Chemotherapy-induced peripheral neuropathy ([place]) 09/03/2017  . Port-A-Cath in place 04/14/2017  . Stage II breast cancer, left ([place]) 02/23/2017  . Carcinoma of upper-outer quadrant of left breast in female, estrogen receptor positive ([place]) 02/10/2017  . Carcinoma of upper-inner quadrant of left breast in female, estrogen receptor positive ([place]) 02/10/2017  . Chest pain 07/18/2015  . Hypokalemia 07/18/2015  . Vertigo 07/18/2015  . LBBB (left bundle branch block) 05/28/2009  . AODM 12/21/2007  . Essential hypertension, benign 12/21/2007  . DIASTOLIC DYSFUNCTION 00/93/8182  . FEMORAL BRUIT, RIGHT 12/21/2007  . DYSPNEA 12/21/2007    Floria Raveling. Hartnett-Rands, MS, PT Per [place] #[id number] 10/19/2017, 3:04 PM  [place] [address] [place], [place], [address] Phone: [phone]   Fax:  [phone]  Name: Penny Huynh MRN: [id number] Date of Birth: November 21, 1933

## 2017-10-19 NOTE — Patient Instructions (Signed)
Balance: Eyes Closed - Bilateral (Varied Surfaces)    Stand, feet shoulder width, close eyes. Maintain balance _30-60___ seconds. Repeat 1____ times per set. Do _1___ sets per session. Repeat on compliant surface: foam.  Copyright  VHI. All rights reserved.   Tandem Stance    Right foot in front of left, heel touching toe both feet "straight ahead". Stand on Foot Triangle of Support with both feet. Balance in this position _30-60__ seconds. Do with left foot in front of right. Add foam or couch cushion as able.   Copyright  VHI. All rights reserved.    Balance: Eyes Closed - Unilateral (Varied Surfaces)    Stand on left foot, close eyes. Maintain balance 20-30____ seconds. Repeat _1___ times per set. Do _1___ sets per session.  Repeat on compliant surface: foam or couch cushion..  Copyright  VHI. All rights reserved.   Walking Head Turn    Standing close to a wall, walk _20___ feet while turning head side to side. Touch wall if necessary to keep balance. Repeat _2___ times. Do _1___ sessions per day.  http://gt2.exer.us/536   Copyright  VHI. All rights reserved.

## 2017-10-21 ENCOUNTER — Encounter (HOSPITAL_COMMUNITY): Payer: Self-pay

## 2017-10-21 ENCOUNTER — Ambulatory Visit (HOSPITAL_COMMUNITY): Payer: Medicare Other

## 2017-10-21 DIAGNOSIS — R293 Abnormal posture: Secondary | ICD-10-CM | POA: Diagnosis not present

## 2017-10-21 DIAGNOSIS — R209 Unspecified disturbances of skin sensation: Secondary | ICD-10-CM | POA: Diagnosis not present

## 2017-10-21 DIAGNOSIS — Z483 Aftercare following surgery for neoplasm: Secondary | ICD-10-CM | POA: Diagnosis not present

## 2017-10-21 DIAGNOSIS — R2681 Unsteadiness on feet: Secondary | ICD-10-CM

## 2017-10-21 NOTE — Therapy (Signed)
[place], [place], [address] Phone: [phone]   Fax:  [phone]  Physical Therapy Treatment  Patient Details  Name: Penny Huynh MRN: [id number] Date of Birth: Jan 20, 1934 Referring Provider: [name]    Encounter Date: 10/21/2017  PT End of Session - 10/21/17 0956    Visit Number  51   kx   Number of Visits  45    Date for PT Re-Evaluation  11/28/17    PT Start Time  0952    PT Stop Time  1030    PT Time Calculation (min)  38 min    Equipment Utilized During Treatment  Gait belt    Activity Tolerance  Patient tolerated treatment well    Behavior During Therapy  [facility] for tasks assessed/performed       Past Medical History:  Diagnosis Date  . Arthritis    "maybe a little in my left knee" (02/23/2017)  . Asthmatic bronchitis   . Cancer of left breast ([place])   . Coronary atherosclerosis    Minor nonobstructive CAD at cardiac catheterization 2009  . Dyslipidemia   . Femoral bruit 7/08   With normal ABIs  . Hypertension   . Lower extremity edema    Chronic  . Pneumonia ~ 1950   in 8th grade  . Type II diabetes mellitus ([place])     Past Surgical History:  Procedure Laterality Date  . APPENDECTOMY    . BREAST BIOPSY Left 02/2017  . CARDIAC CATHETERIZATION     "long time ago" (02/23/2017)  . MASTECTOMY COMPLETE / SIMPLE W/ SENTINEL NODE BIOPSY Left 02/23/2017   TOTAL MATECTOMY;  LEFT AXILLARY SENTINEL LYMPH NODE BIOPSY ERAS PATHWAY  . MASTECTOMY WITH RADIOACTIVE SEED GUIDED EXCISION AND AXILLARY SENTINEL LYMPH NODE BIOPSY Left 02/23/2017   Procedure: LEFT TOTAL MATECTOMY;  LEFT AXILLARY SENTINEL LYMPH NODE BIOPSY ERAS PATHWAY;  Surgeon: Excell Seltzer, MD;  Location: [place];  Service: General;  Laterality: Left;  . PILONIDAL CYST EXCISION    . PORTA CATH INSERTION  02/23/2017  . PORTACATH PLACEMENT N/A 02/23/2017   Procedure: INSERTION PORT-A-CATH;  Surgeon: Excell Seltzer, MD;  Location: [place];  Service: General;  Laterality: N/A;  . SHOULDER ARTHROSCOPY W/ ROTATOR CUFF REPAIR Right     There were no vitals filed for this visit.  Subjective Assessment - 10/21/17 0955    Subjective  Was on her feet quite a bit yesterday and feet hurt a lot last night. 8/10 today. Calf muscles sore today from last treatment session. Had an appointment yesterday so wasn't able to perform HEP yet.     Currently in Pain?  Yes    Pain Score  8     Pain Location  Foot    Pain Orientation  Left;Right    Pain Descriptors / Indicators  Burning                       [facility] Adult PT Treatment/Exercise - 10/21/17 0001      Ambulation/Gait   Ambulation/Gait  --    Ambulation/Gait Assistance  --    Ambulation Distance (Feet)  --    Assistive device  --    Gait Comments  --      Knee/Hip Exercises: Standing   Heel Raises  Both;20 reps    Hip Extension  Both;15 reps    Other Standing Knee Exercises  Bil tandem stance on foam with UE flexion using  1# bar x 15 each leg forward     Other Standing Knee Exercises  calf stretch - slantboard 10 sec 5 reps          Balance Exercises - 10/21/17 1221      Balance Exercises: Standing   Gait with Head Turns  --   226 ft w/ head nods and turns       PT Education - 10/21/17 1222    Education Details  Reviewed HEP. Discussed purpose and technique of treatment throughout session.    Person(s) Educated  Patient    Methods  Explanation;Demonstration    Comprehension  Verbalized understanding       PT Short Term Goals - 09/28/17 1518      PT SHORT TERM GOAL #1   Title  Pt will decrease TUG score to 13 sec indicating an improvment in functional balance with decrased risk for fall     Baseline  14.14    Time  4    Period  Weeks    Status  New        PT Long Term Goals - 09/28/17 1519      PT LONG TERM GOAL #1   Title  Pt will decrease TUG score to 12 indicating decreased risk for falls.     Baseline  14.14    Time  8     Period  Weeks    Status  New      PT LONG TERM GOAL #2   Title  Pt will be independent in  a home exercise program for balance improvment     Time  8    Period  Weeks    Status  New      PT LONG TERM GOAL #3   Title  Pt will report he feet pain is decreased to 7/10 at times     Baseline  10+/10    Time  8    Period  Weeks    Status  New      PT LONG TERM GOAL #4   Title  Pt will report she feels an improvment in her walking and balance and that she is more comfortable with walking during outings in her daily life     Time  8    Period  Weeks    Status  New            Plan - 10/21/17 0956    Clinical Impression Statement  Session focused on balance exercises and reviewing HEP to include more balancing exercises with narrow base, like semi-tandem and tandem and SLS with eyes open, eyes closed and on solid surface as well as foam compliant surgace when appropriate for her skill level. Patient unable to perform HEP since last session but will 1-2 times before returning to ensure she does not have any questions. Patient verbalized she feels she would be able to continue with HEP independently to continue working on her balance and ambulation. Re-eval next session.     Rehab Potential  Good    Clinical Impairments Affecting Rehab Potential  chemotherapy induced peripheral neuropathy, pt states she goes to a community exercise program     PT Frequency  2x / week    PT Duration  8 weeks    PT Treatment/Interventions  ADLs/Self Care Home Management;Neuromuscular re-education;Gait training;Electrical Stimulation;Contrast Bath;DME Instruction;Moist Heat;Functional mobility training;Therapeutic activities;Therapeutic exercise;Balance training;Patient/family education;Manual techniques;Taping;Passive range of motion    PT Next Visit Plan  Re-eval next session. Review  HEP to ensure competency.     PT Home Exercise Plan  10/19/2017 - balance - narrow base, semi-tandem, tandem and SLS on solid  surface, as well as foam compliant when appropriate for her skill level.    Consulted and Agree with Plan of Care  Patient       Patient will benefit from skilled therapeutic intervention in order to improve the following deficits and impairments:  Abnormal gait, Decreased knowledge of use of DME, Decreased skin integrity, Pain, Postural dysfunction, Decreased mobility, Decreased activity tolerance, Decreased endurance, Difficulty walking, Decreased balance  Visit Diagnosis: Unsteadiness on feet  Unspecified disturbances of skin sensation  Abnormal posture     Problem List Patient Active Problem List   Diagnosis Date Noted  . Chemotherapy-induced peripheral neuropathy ([place]) 09/03/2017  . Port-A-Cath in place 04/14/2017  . Stage II breast cancer, left ([place]) 02/23/2017  . Carcinoma of upper-outer quadrant of left breast in female, estrogen receptor positive ([place]) 02/10/2017  . Carcinoma of upper-inner quadrant of left breast in female, estrogen receptor positive ([place]) 02/10/2017  . Chest pain 07/18/2015  . Hypokalemia 07/18/2015  . Vertigo 07/18/2015  . LBBB (left bundle branch block) 05/28/2009  . AODM 12/21/2007  . Essential hypertension, benign 12/21/2007  . DIASTOLIC DYSFUNCTION 37/05/8887  . FEMORAL BRUIT, RIGHT 12/21/2007  . DYSPNEA 12/21/2007    Floria Raveling. Hartnett-Rands, MS, PT Per [place] #[id number] 10/21/2017, 12:43 PM  [place] [address] [place], [place], [address] Phone: [phone]   Fax:  [phone]  Name: GAYL IVANOFF MRN: [id number] Date of Birth: 11/26/1933

## 2017-10-25 ENCOUNTER — Other Ambulatory Visit: Payer: Self-pay

## 2017-10-25 ENCOUNTER — Ambulatory Visit: Payer: Self-pay | Admitting: Radiation Oncology

## 2017-10-25 ENCOUNTER — Ambulatory Visit
Admission: RE | Admit: 2017-10-25 | Discharge: 2017-10-25 | Disposition: A | Discharge status: Home / Self Care | Payer: Medicare Other | Source: Ambulatory Visit | Attending: Radiation Oncology | Admitting: Radiation Oncology

## 2017-10-25 ENCOUNTER — Encounter: Payer: Self-pay | Admitting: Radiation Oncology

## 2017-10-25 VITALS — BP 158/88 | HR 86 | Temp 98.1°F | Resp 20 | Ht 64.5 in | Wt 153.0 lb

## 2017-10-25 DIAGNOSIS — C50912 Malignant neoplasm of unspecified site of left female breast: Secondary | ICD-10-CM | POA: Insufficient documentation

## 2017-10-25 DIAGNOSIS — Z7984 Long term (current) use of oral hypoglycemic drugs: Secondary | ICD-10-CM | POA: Insufficient documentation

## 2017-10-25 DIAGNOSIS — G620 Drug-induced polyneuropathy: Secondary | ICD-10-CM | POA: Diagnosis not present

## 2017-10-25 DIAGNOSIS — Z79899 Other long term (current) drug therapy: Secondary | ICD-10-CM | POA: Insufficient documentation

## 2017-10-25 DIAGNOSIS — C50412 Malignant neoplasm of upper-outer quadrant of left female breast: Secondary | ICD-10-CM

## 2017-10-25 DIAGNOSIS — Z9221 Personal history of antineoplastic chemotherapy: Secondary | ICD-10-CM | POA: Insufficient documentation

## 2017-10-25 DIAGNOSIS — Z170 Estrogen receptor positive status [ER+]: Secondary | ICD-10-CM | POA: Insufficient documentation

## 2017-10-25 DIAGNOSIS — Z7982 Long term (current) use of aspirin: Secondary | ICD-10-CM | POA: Insufficient documentation

## 2017-10-25 NOTE — Progress Notes (Signed)
Radiation Oncology         [phone] ________________________________  Name: Penny Huynh MRN: [id number]  Date of Service: 10/25/2017  DOB: Mar 26, 1933  Post Treatment Note  CC: Manon Hilding, MD  Sasser, Silvestre Moment, MD  Diagnosis:   Multifocal Stage IB, pT2N0M0 grade 3 ER/PR positive invasive ductal carcinoma, and Stage IB, pT3N0M0 grade 3 triple positive invasive ductal carcinoma of the left breast  Interval Since Last Radiation:  8 weeks   07/20/2017 - 09/03/2017: The patient initially received a dose of 50.4 Gy in 28 fractions to the left chest wall and left supraclavicular region. This was delivered using a 3-D conformal, 4 field technique. The patient then received a boost to the mastectomy scar. This delivered an additional 10 Gy in 5 fractions using an en face electron field. The total dose was 60.4 Gy.  Narrative:  The patient returns today for routine follow-up. During treatment she did very well with radiotherapy but did have desquamation towards the end of treatment.                            On review of systems, the patient states she's doing okay but still having a great deal of trouble with her neuropathy. She has started Neurontin with Dr. Mickeal Skinner and has not noticed improvement. She feels like her skin is healing well and she's been using cream she was given during treatment now. No other complaints are noted.  ALLERGIES:  has No Known Allergies.  Meds: Current Outpatient Medications  Medication Sig Dispense Refill  . amitriptyline (ELAVIL) 50 MG tablet Take 1 tablet (50 mg total) by mouth at bedtime. 30 tablet 3  . amLODipine (NORVASC) 5 MG tablet Take 1 tablet (5 mg total) by mouth daily. 30 tablet 2  . anastrozole (ARIMIDEX) 1 MG tablet Take 1 tablet (1 mg total) by mouth daily. 30 tablet 1  . aspirin 81 MG EC tablet Take 81 mg by mouth at bedtime.     . chlorthalidone (HYGROTON) 25 MG tablet Take 25 mg by mouth daily.     . Coenzyme Q10 (COQ10) 100 MG CAPS  Take 100 mg by mouth daily.      Marland Kitchen gabapentin (NEURONTIN) 300 MG capsule Take 3 capsules (900 mg total) by mouth 3 (three) times daily. 120 capsule 3  . latanoprost (XALATAN) 0.005 % ophthalmic solution Place 1 drop into both eyes at bedtime.    . lidocaine-prilocaine (EMLA) cream APPLY CREAM TOPICALLY TO AFFECTED AREA ONCE  3  . losartan (COZAAR) 50 MG tablet Take 50 mg by mouth daily.    . metFORMIN (GLUCOPHAGE) 500 MG tablet Take 1,000 mg by mouth daily with breakfast.     . potassium chloride SA (K-DUR,KLOR-CON) 20 MEQ tablet Take 1 tablet (20 mEq total) by mouth 2 (two) times daily. 60 tablet 1  . vitamin B-12 (CYANOCOBALAMIN) 1000 MCG tablet Take 1,000 mcg by mouth daily.    Marland Kitchen HYDROcodone-acetaminophen (NORCO/VICODIN) 5-325 MG tablet Take 1 tablet by mouth every 6 (six) hours as needed for moderate pain. (Patient not taking: Reported on 10/25/2017) 40 tablet 0   No current facility-administered medications for this encounter.     Physical Findings:  height is 5' 4.5" (1.638 m) and weight is 153 lb (69.4 kg). Her oral temperature is 98.1 F (36.7 C). Her blood pressure is 158/88 (abnormal) and her pulse is 86. Her respiration is 20 and oxygen saturation is 100%.  Pain Assessment Pain Score: 8 (Feet) Pain Frequency: Constant Pain Loc: Hand(Feet)/10 In general this is a well appearing African American female in no acute distress. She's alert and oriented x4 and appropriate throughout the examination. Cardiopulmonary assessment is negative for acute distress and she exhibits normal effort. The left chest wall is healing well but has hyperpigmentation along the majority of the left chest with mild hypopigmentation along the lateral aspect of her mastectomy scar line.  Lab Findings: Lab Results  Component Value Date   WBC 3.4 (L) 10/13/2017   HGB 10.0 (L) 10/13/2017   HCT 30.7 (L) 10/13/2017   MCV 87.4 10/13/2017   PLT 303 10/13/2017     Radiographic Findings: No results  found.  Impression/Plan: 1. Multifocal Stage IB, pT2N0M0 grade 3 ER/PR positive invasive ductal carcinoma, and Stage IB, pT3N0M0 grade 3 triple positive invasive ductal carcinoma of the left breast. The patient has been doing well since completion of radiotherapy. We discussed that we would be happy to continue to follow her as needed, but she will also continue to follow up with [name] in medical oncology. She was counseled on skin care as well as measures to avoid sun exposure to this area.  2. Chemotherapy induced peripheral neuropathy. The patient has been on Neurontin and recently saw Dr. Mickeal Skinner. We discussed that if her symptoms haven't improved she should contact Dr. Mickeal Skinner, however I did explain to her that it does take a good deal of time to see improvement of these types of symptoms. She is in agreement.    Carola Rhine, PAC

## 2017-10-25 NOTE — Addendum Note (Signed)
Encounter addended by: Malena Edman, RN on: 10/25/2017 10:30 AM  Actions taken: Charge Capture section accepted

## 2017-10-26 ENCOUNTER — Ambulatory Visit (HOSPITAL_COMMUNITY): Payer: Medicare Other | Admitting: Physical Therapy

## 2017-10-26 ENCOUNTER — Encounter (HOSPITAL_COMMUNITY): Payer: Self-pay | Admitting: Physical Therapy

## 2017-10-26 ENCOUNTER — Other Ambulatory Visit: Payer: Self-pay

## 2017-10-26 DIAGNOSIS — R209 Unspecified disturbances of skin sensation: Secondary | ICD-10-CM | POA: Diagnosis not present

## 2017-10-26 DIAGNOSIS — R293 Abnormal posture: Secondary | ICD-10-CM | POA: Diagnosis not present

## 2017-10-26 DIAGNOSIS — R2681 Unsteadiness on feet: Secondary | ICD-10-CM

## 2017-10-26 DIAGNOSIS — Z483 Aftercare following surgery for neoplasm: Secondary | ICD-10-CM | POA: Diagnosis not present

## 2017-10-26 NOTE — Therapy (Signed)
[place], [place], [address] Phone: [phone]   Fax:  [phone]  Physical Therapy Treatment  Patient Details  Name: Penny Huynh MRN: [id number] Date of Birth: [birth year] Referring Provider: [name]    Encounter Date: 10/26/2017   PHYSICAL THERAPY DISCHARGE SUMMARY  Visits from Start of Care: 11; Began with ROM then Lymphedema and lastly focused on balance   Current functional level related to goals / functional outcomes: See below    Remaining deficits: Balance due to neuropathy    Education / Equipment: HEP Plan: Patient agrees to discharge.  Patient goals were met. Patient is being discharged due to meeting the stated rehab goals.  ?????     PT End of Session - 10/26/17 0934    Visit Number  81   kx   Number of Visits  45    Date for PT Re-Evaluation  11/28/17    PT Start Time  0900    PT Stop Time  0934    PT Time Calculation (min)  34 min    Equipment Utilized During Treatment  Gait belt    Activity Tolerance  Patient tolerated treatment well    Behavior During Therapy  WFL for tasks assessed/performed       Past Medical History:  Diagnosis Date  . Arthritis    "maybe a little in my left knee" (02/23/2017)  . Asthmatic bronchitis   . Cancer of left breast ([place])   . Coronary atherosclerosis    Minor nonobstructive CAD at cardiac catheterization 2009  . Dyslipidemia   . Femoral bruit 7/08   With normal ABIs  . Hypertension   . Lower extremity edema    Chronic  . Pneumonia ~ 1950   in 8th grade  . Type II diabetes mellitus ([place])     Past Surgical History:  Procedure Laterality Date  . APPENDECTOMY    . BREAST BIOPSY Left 02/2017  . CARDIAC CATHETERIZATION     "long time ago" (02/23/2017)  . MASTECTOMY COMPLETE / SIMPLE W/ SENTINEL NODE BIOPSY Left 02/23/2017   TOTAL MATECTOMY;  LEFT AXILLARY SENTINEL LYMPH NODE BIOPSY ERAS PATHWAY  . MASTECTOMY WITH RADIOACTIVE SEED  GUIDED EXCISION AND AXILLARY SENTINEL LYMPH NODE BIOPSY Left 02/23/2017   Procedure: LEFT TOTAL MATECTOMY;  LEFT AXILLARY SENTINEL LYMPH NODE BIOPSY ERAS PATHWAY;  Surgeon: Excell Seltzer, MD;  Location: [facility];  Service: General;  Laterality: Left;  . PILONIDAL CYST EXCISION    . PORTA CATH INSERTION  02/23/2017  . PORTACATH PLACEMENT N/A 02/23/2017   Procedure: INSERTION PORT-A-CATH;  Surgeon: Excell Seltzer, MD;  Location: [facility];  Service: General;  Laterality: N/A;  . SHOULDER ARTHROSCOPY W/ ROTATOR CUFF REPAIR Right     There were no vitals filed for this visit.  Subjective Assessment - 10/26/17 0906    Subjective  Penny Huynh states that the neuropathy is the only thing that is bothering her.  She is doing her HEP about 3x a week at this time.      Pertinent History  Patient underwent a left mastectomy and sentinel node biopsy (6 negative nodes) on 02/23/17. She will begin chemotherapy 03/31/17 as long as drain is removed today. She has 2 different breast cancers. Both are ER/PR positive but one is HER2 negative and one is HER2 positive. History of right shoulder scope 20 years ago and has diabetes and hypertension, both of which are well controlled. 09/28/2017: pt has completed chemo and radiation  completed 09/03/2017.  She is still getting herceptin infusions every 3 weeks.  She continues to struggle with chemotherapy induced peripheral neuropathy in her hands and feet.  Her hands feel cold and she has pain and numbness in her feet     How long can you sit comfortably?  as long as she wants     How long can you stand comfortably?  able to stand for as long as she wants     How long can you walk comfortably?  ambulates with a cane for 30 mintues at a time.     Patient Stated Goals  to improve her balance and walking     Currently in Pain?  Yes    Pain Score  --   due to neuropathy fingers and feet are numb    Pain Location  Foot    Pain Orientation  Right;Left    Pain Descriptors /  Indicators  Numbness    Pain Onset  More than a month ago         [facility] PT Assessment - 10/26/17 0001      Assessment   Medical Diagnosis  s/p left mastectomy and SLNB    Referring Provider  [name]     Onset Date/Surgical Date  02/23/17    Hand Dominance  Right    Prior Therapy  Baseline assessment      Precautions   Precautions  Other (comment)    Precaution Comments  Left arm lymphedema risk      Restrictions   Weight Bearing Restrictions  No      Balance Screen   Has the patient fallen in the past 6 months  No    Has the patient had a decrease in activity level because of a fear of falling?   No    Is the patient reluctant to leave their home because of a fear of falling?   No      Home Film/video editor residence    Living Arrangements  Spouse/significant other    Available Help at Discharge  Family    Type of [place]  Two level      Prior Function   Level of Independence  Needs assistance with ADLs   due to CIPN   Vocation  Retired    Leisure  She is not currently exercising      Cognition   Overall Cognitive Status  Within Functional Limits for tasks assessed      Observation/Other Assessments   Observations  Pt comes into clinic walking with a cane with supportive athletic shoes on both feet.     Skin Integrity  open area on chest from radiation moist desquamation. , pt has difference in skin color on chest with tightness visible in axilla  She has not been able to wear her breast prostheses due to open areas     Quick DASH   9.09   10 of 11 questions answered     Functional Tests   Functional tests  Single leg stance      Single Leg Stance   Comments  RT:  15 seconds; LT: 4      Sit to Stand   Comments  11 sit to stands in 30 seconds       Posture/Postural Control   Posture/Postural Control  Postural limitations    Postural Limitations  Rounded Shoulders;Forward head      AROM  AROM Assessment Site   --   All are functional    Right Shoulder Extension  --    Right Shoulder Flexion  --    Right Shoulder ABduction  --    Right Shoulder Internal Rotation  --    Right Shoulder External Rotation  --    Left Shoulder Extension  --    Left Shoulder Flexion  --    Left Shoulder ABduction  --    Left Shoulder Internal Rotation  --    Left Shoulder External Rotation  --      Palpation   Palpation comment  cording observed and palpated in left axilla and left elbow with left shoulder in abduction      Balance   Balance Assessed  Yes      High Level Balance   High Level Balance Activities  Head turns   up, down, right, left, straight x 20 feet x 8   High Level Balance Comments  In hall with fingertips along wall: braiding bil 2 times each; heel-toe 4 times with VCs for slow, controlled pace; then slow, high knee marching 2 times with cuing for slower pacing. SBA-CGA required throughout, especially with braiding      Standardized Balance Assessment   Standardized Balance Assessment  Dynamic Gait Index      Berg Balance Test   Sit to Stand  Able to stand without using hands and stabilize independently    Standing Unsupported  Able to stand safely 2 minutes    Sitting with Back Unsupported but Feet Supported on Floor or Stool  Able to sit safely and securely 2 minutes    Stand to Sit  Sits safely with minimal use of hands    Transfers  Able to transfer safely, minor use of hands    Standing Unsupported with Eyes Closed  Able to stand 10 seconds safely    Standing Ubsupported with Feet Together  Able to place feet together independently and stand 1 minute safely    From Standing, Reach Forward with Outstretched Arm  Can reach confidently >25 cm (10")    From Standing Position, Pick up Object from Floor  Able to pick up shoe safely and easily    From Standing Position, Turn to Look Behind Over each Shoulder  Looks behind from both sides and weight shifts well    Turn 360 Degrees  Able to  turn 360 degrees safely in 4 seconds or less    Standing Unsupported, Alternately Place Feet on Step/Stool  Able to stand independently and safely and complete 8 steps in 20 seconds    Standing Unsupported, One Foot in Front  Able to plae foot ahead of the other independently and hold 30 seconds    Standing on One Leg  Able to lift leg independently and hold 5-10 seconds    Total Score  54      Dynamic Gait Index   Level Surface  Normal    Change in Gait Speed  Normal    Gait with Horizontal Head Turns  Normal    Gait with Vertical Head Turns  Mild Impairment    Gait and Pivot Turn  Normal    Step Over Obstacle  Normal    Step Around Obstacles  Normal    Steps  Mild Impairment    Total Score  22      Timed Up and Go Test   Normal TUG (seconds)  8.85   was 12 sec on  Jun 08, 2017              PT Education - 10/26/17 0933    Education provided  Yes    Education Details  The importance of walking 30 mintues a day and completing balance acitivities daily     Person(s) Educated  Patient    Methods  Explanation;Handout    Comprehension  Verbalized understanding       PT Short Term Goals - 10/26/17 0936      PT SHORT TERM GOAL #1   Title  Pt will decrease TUG score to 13 sec indicating an improvment in functional balance with decrased risk for fall     Baseline  14.14    Time  4    Period  Weeks    Status  Achieved        PT Long Term Goals - 10/26/17 0936      PT LONG TERM GOAL #1   Title  Pt will decrease TUG score to 12 indicating decreased risk for falls.     Baseline  14.14    Time  8    Period  Weeks    Status  Achieved      PT LONG TERM GOAL #2   Title  Pt will be independent in  a home exercise program for balance improvment     Time  8    Period  Weeks    Status  Achieved      PT LONG TERM GOAL #3   Title  Pt will report he feet pain is decreased to 7/10 at times     Baseline  10+/10    Time  8    Period  Weeks    Status  Achieved      PT LONG  TERM GOAL #4   Title  Pt will report she feels an improvment in her walking and balance and that she is more comfortable with walking during outings in her daily life     Time  8    Period  Weeks    Status  Achieved            Plan - 10/26/17 0934    Clinical Impression Statement  Pt continues to have decreased sensation in fingers and feet.  Explained to pt that nothing cures neuropathy you only learn coping mechanisms.  PT verbalized understanding.  PT scored signifcantly better on balance activities today.  Pt is ready to be discharged to a HEP.     Rehab Potential  Good    Clinical Impairments Affecting Rehab Potential  chemotherapy induced peripheral neuropathy, pt states she goes to a community exercise program     PT Frequency  2x / week    PT Duration  8 weeks    PT Treatment/Interventions  ADLs/Self Care Home Management;Neuromuscular re-education;Gait training;Electrical Stimulation;Contrast Bath;DME Instruction;Moist Heat;Functional mobility training;Therapeutic activities;Therapeutic exercise;Balance training;Patient/family education;Manual techniques;Taping;Passive range of motion    PT Next Visit Plan  Discharge.     PT Home Exercise Plan  10/19/2017 - balance - narrow base, semi-tandem, tandem and SLS on solid surface, as well as foam compliant when appropriate for her skill level.    Consulted and Agree with Plan of Care  Patient       Patient will benefit from skilled therapeutic intervention in order to improve the following deficits and impairments:  Abnormal gait, Decreased knowledge of use of DME, Decreased skin integrity, Pain, Postural dysfunction, Decreased mobility, Decreased activity tolerance, Decreased  endurance, Difficulty walking, Decreased balance  Visit Diagnosis: Unsteadiness on feet  Unspecified disturbances of skin sensation  Abnormal posture  Aftercare following surgery for neoplasm     Problem List Patient Active Problem List   Diagnosis  Date Noted  . Chemotherapy-induced peripheral neuropathy ([place]) 09/03/2017  . Port-A-Cath in place 04/14/2017  . Stage II breast cancer, left ([place]) 02/23/2017  . Carcinoma of upper-outer quadrant of left breast in female, estrogen receptor positive ([place]) 02/10/2017  . Carcinoma of upper-inner quadrant of left breast in female, estrogen receptor positive ([place]) 02/10/2017  . Chest pain 07/18/2015  . Hypokalemia 07/18/2015  . Vertigo 07/18/2015  . LBBB (left bundle branch block) 05/28/2009  . AODM 12/21/2007  . Essential hypertension, benign 12/21/2007  . DIASTOLIC DYSFUNCTION 00/86/7619  . FEMORAL BRUIT, RIGHT 12/21/2007  . DYSPNEA 12/21/2007   Rayetta Humphrey, PT CLT [phone] 10/26/2017, 9:37 AM  [facility] [address] [place], [place], [address] Phone: [phone]   Fax:  [phone]  Name: Penny Huynh MRN: [id number] Date of Birth: 1933-04-18

## 2017-10-27 ENCOUNTER — Ambulatory Visit: Payer: Medicare Other | Admitting: Nurse Practitioner

## 2017-10-27 ENCOUNTER — Other Ambulatory Visit: Payer: Medicare Other

## 2017-10-27 ENCOUNTER — Ambulatory Visit: Payer: Medicare Other

## 2017-10-27 ENCOUNTER — Ambulatory Visit (INDEPENDENT_AMBULATORY_CARE_PROVIDER_SITE_OTHER): Payer: Medicare Other

## 2017-10-27 ENCOUNTER — Other Ambulatory Visit: Payer: Self-pay

## 2017-10-27 DIAGNOSIS — Z170 Estrogen receptor positive status [ER+]: Secondary | ICD-10-CM | POA: Diagnosis not present

## 2017-10-27 DIAGNOSIS — C50412 Malignant neoplasm of upper-outer quadrant of left female breast: Secondary | ICD-10-CM | POA: Diagnosis not present

## 2017-10-27 DIAGNOSIS — Z5111 Encounter for antineoplastic chemotherapy: Secondary | ICD-10-CM

## 2017-10-28 ENCOUNTER — Encounter (HOSPITAL_COMMUNITY): Payer: Medicare Other | Admitting: Physical Therapy

## 2017-10-28 DIAGNOSIS — Z6826 Body mass index (BMI) 26.0-26.9, adult: Secondary | ICD-10-CM | POA: Diagnosis not present

## 2017-10-28 DIAGNOSIS — Z1389 Encounter for screening for other disorder: Secondary | ICD-10-CM | POA: Diagnosis not present

## 2017-10-28 DIAGNOSIS — R252 Cramp and spasm: Secondary | ICD-10-CM | POA: Diagnosis not present

## 2017-10-28 DIAGNOSIS — I10 Essential (primary) hypertension: Secondary | ICD-10-CM | POA: Diagnosis not present

## 2017-10-28 DIAGNOSIS — Z1331 Encounter for screening for depression: Secondary | ICD-10-CM | POA: Diagnosis not present

## 2017-10-28 DIAGNOSIS — Z23 Encounter for immunization: Secondary | ICD-10-CM | POA: Diagnosis not present

## 2017-10-28 DIAGNOSIS — E782 Mixed hyperlipidemia: Secondary | ICD-10-CM | POA: Diagnosis not present

## 2017-10-28 DIAGNOSIS — E1140 Type 2 diabetes mellitus with diabetic neuropathy, unspecified: Secondary | ICD-10-CM | POA: Diagnosis not present

## 2017-11-01 ENCOUNTER — Other Ambulatory Visit: Payer: Self-pay | Admitting: Internal Medicine

## 2017-11-02 NOTE — Progress Notes (Signed)
[facility]  [phone] [phone]  Clinic Follow up Note   Patient Care Team: Sasser, Silvestre Moment, MD as PCP - General (Cardiology) [name], MD as PCP - Cardiology (Cardiology) Truitt Merle, MD as Consulting Physician (Hematology) Excell Seltzer, MD as Consulting Physician (General Surgery) Alla Feeling, NP as Nurse Practitioner (Nurse Practitioner) [name], MD as Attending Physician (Cardiology) 11/03/2017  SUMMARY OF ONCOLOGIC HISTORY: Oncology History   Cancer Staging Carcinoma of upper-inner quadrant of left breast in female, estrogen receptor positive ([place]) Staging form: Breast, AJCC 8th Edition - Clinical stage from 01/18/2017: Stage IB (cT2, cN0, cM0, G3, ER: Positive, PR: Positive, HER2: Positive) - Signed by Truitt Merle, MD on 02/10/2017 - Pathologic stage from 02/23/2017: Stage IB (pT3, pN0, cM0, G3, ER: Positive, PR: Positive, HER2: Positive) - Signed by Alla Feeling, NP on 03/16/2017  Carcinoma of upper-outer quadrant of left breast in female, estrogen receptor positive ([place]) Staging form: Breast, AJCC 8th Edition - Clinical stage from 01/18/2017: Stage IB (cT2, cN0, cM0, G2, ER: Positive, PR: Positive, HER2: Negative) - Unsigned - Pathologic stage from 02/23/2017: Stage IB (pT2, pN0(sn), cM0, G3, ER: Positive, PR: Positive, HER2: Negative) - Signed by Alla Feeling, NP on 03/16/2017       Carcinoma of upper-outer quadrant of left breast in female, estrogen receptor positive ([place])   01/13/2017 Mammogram    FINDINGS: In the left breast, a spiculated mass lies in the upper outer quadrant. In the medial posterior left breast, there is a larger lobulated mass. These masses correspond to the palpable abnormalities. No other discrete left breast masses.  In the right breast, there are no discrete masses. There are no areas of architectural distortion.  In both breasts there are scattered calcifications,  rounded punctate, with a few other larger coarse dystrophic calcifications, without significant change from the previous screening mammogram, which is dated 12/04/2009.  Mammographic images were processed with CAD.  On physical exam, there is a firm palpable mass in the upper outer left breast and another firm mass in the medial left breast.     01/13/2017 Breast US    Targeted ultrasound is performed, showing an irregular hypoechoic shadowing mass in the left breast at the 2:30 o'clock position, 5 cm the nipple, measuring 3.3 x 2.8 x 2.6 cm. In the 10:30 o'clock position of the left breast, 5 cm the nipple, there is irregular hypoechoic mass with somewhat more lobulated margins, corresponding to the lobulated mass seen mammographically. This measures 4.8 x 2.9 x 4.8 cm. Both masses show internal vascularity on color Doppler analysis.  Sonographic evaluation of the left axilla shows several nodes with thickened cortices. Status cortex measured is 5 mm. None of these lymph nodes appear enlarged but overall size criteria.  IMPRESSION: 1. Two masses in the left breast, 1 at the 2:30 o'clock position in the other at the 10:30 o'clock position, both highly suspicious for breast carcinoma. 2. Several abnormal left axillary lymph nodes with thickened cortices. 3. No evidence of right breast malignancy.     01/18/2017 Pathology Results    Diagnosis 1. Breast, left, needle core biopsy, 2:30 o'clock - INVASIVE MAMMARY CARCINOMA - MAMMARY CARCINOMA IN-SITU - SEE COMMENT 2. Lymph node, needle/core biopsy, left axilla - NO CARCINOMA IDENTIFIED - SEE COMMENT 3. Breast, left, needle core biopsy, 10:30 o'clock - INVASIVE DUCTAL CARCINOMA - SEE COMMENT  1. PROGNOSTIC INDICATORS Results: IMMUNOHISTOCHEMICAL AND MORPHOMETRIC ANALYSIS PERFORMED MANUALLY Estrogen Receptor: 100%, POSITIVE, STRONG STAINING  INTENSITY Progesterone Receptor: 60%, POSITIVE, STRONG STAINING  INTENSITY Proliferation Marker Ki67: 60%  1. FLUORESCENCE IN-SITU HYBRIDIZATION Results: HER2 - NEGATIVE RATIO OF HER2/CEP17 SIGNALS 1.28 AVERAGE HER2 COPY NUMBER PER CELL 2.75  3. PROGNOSTIC INDICATORS Results: IMMUNOHISTOCHEMICAL AND MORPHOMETRIC ANALYSIS PERFORMED MANUALLY Estrogen Receptor: 100%, POSITIVE, STRONG STAINING INTENSITY Progesterone Receptor: 50%, POSITIVE, STRONG STAINING INTENSITY Proliferation Marker Ki67: 70%  3. FLUORESCENCE IN-SITU HYBRIDIZATION Results: HER2 - **POSITIVE** RATIO OF HER2/CEP17 SIGNALS 2.80 AVERAGE HER2 COPY NUMBER PER CELL 6.15  Microscopic Comment 1. The biopsy material shows an infiltrative proliferation of cells with large vesicular nuclei with inconspicuous nucleoli, arranged linearly and in small clusters. Based on the biopsy, the carcinoma appears Nottingham grade 2 of 3 and measures 0.9 cm in greatest linear extent. E-cadherin and prognostic markers (ER/PR/ki-67/HER2-FISH)are pending and will be reported in an addendum. Dr. Lyndon Code reviewed the case and agrees with the above diagnosis.    02/10/2017 Initial Diagnosis    Carcinoma of upper-outer quadrant of left breast in female, estrogen receptor positive ([name])    02/19/2017 Imaging    Bone Scan whole Body 02/19/17 IMPRESSION: Negative for evidence of bony metastatic disease.    02/19/2017 Imaging    CT CAP W Contrast 02/19/17 IMPRESSION: 1. Small right middle lobe pulmonary nodules up to 0.8 cm, nonspecific. Non-contrast chest CT at 3-6 months is recommended. If the nodules are stable at time of repeat CT, then future CT at 18-24 months (from today's scan) is considered optional for low-risk patients, but is recommended for high-risk patients. This recommendation follows the consensus statement: Guidelines for Management of Incidental Pulmonary Nodules Detected on CT Images: From the Fleischner Society 2017; Radiology 2017; 284:228-243. 2. Subcentimeter hepatic lesions likely cysts  but incompletely characterized due to small size. Attention on follow-up imaging recommended. 3. 2.1 cm fusiform right common iliac artery aneurysm. Continued surveillance recommended. 4. Aortic Atherosclerosis (ICD10-I70.0) and Emphysema (ICD10-J43.9).    02/23/2017 Pathology Results    Diagnosis 1. Breast, simple mastectomy, Left Total - INVASIVE DUCTAL CARCINOMA, MULTIFOCAL, NOTTINGHAM GRADE 3/3 (5.3 CM, 3.5 CM) - INVASIVE CARCINOMA INVOLVES THE DERMIS - DUCTAL CARCINOMA IN SITU, INTERMEDIATE GRADE - HYALINIZED FIBROADENOMA - MARGINS UNINVOLVED BY CARCINOMA - NO CARCINOMA IDENTIFIED IN TWO LYMPH NODES (0/2) - SEE ONCOLOGY TABLE AND COMMENT BELOW 2. Lymph node, sentinel, biopsy, Left Axillary - NO CARCINOMA IDENTIFIED IN ONE LYMPH NODE (0/1) 3. Lymph node, sentinel, biopsy - NO CARCINOMA IDENTIFIED IN ONE LYMPH NODE (0/1) 4. Lymph node, sentinel, biopsy - NO CARCINOMA IDENTIFIED IN ONE LYMPH NODE (0/1) 5. Lymph node, sentinel, biopsy - NO CARCINOMA IDENTIFIED IN ONE LYMPH NODE (0/1) Microscopic Comment 1. BREAST, INVASIVE TUMOR Procedure: Simple mastectomy Laterality: Left Tumor Size: 5. 3 cm, 3.5 cm Histologic Type: Invasive carcinoma of no special type (ductal, not otherwise specified) Grade: Nottingham Grade 3 Tubular Differentiation: 3 Nuclear Pleomorphism: 3 Mitotic Count: 2 Ductal Carcinoma in Situ (DCIS): Present Extent of Tumor: Skin: Invasive carcinoma directly invades into the dermis or epidermis without skin ulceration Margins: Invasive carcinoma, distance from closest margin: 1.5 cm (posterior) DCIS, distance from closest margin: > 1 cm Regional Lymph Nodes: Number of Lymph Nodes Examined: 6 Number of Sentinel Lymph Nodes Examined: 4 Lymph Nodes with Macrometastases: 0 Lymph Nodes with Micrometastases: 0 Lymph Nodes with Isolated Tumor Cells: 0 Treatment effect: No known presurgical therapy Breast Prognostic Profile: See Also ([id number]) Estrogen  Receptor: Positive (100%, strong); Positive (100%, strong) Progesterone Receptor: Positive (50%, strong); Positive (50%, strong) Her2: Positive (Ratio 2.80); Negative (Ratio 1.28)  Ki-67: 70%; 60% Best tumor block for sendout testing: 1E Pathologic Stage Classification (pTNM, AJCC 8th Edition): Primary Tumor: mpT3 Regional Lymph Nodes: pN0 COMMENT: The two invasive carcinomas in the breast have slightly different morphologies. The larger lesion has a papillary and micropapillary pattern admixed with typical ductal carcinoma while the smaller lesion is more typical of a ductal lesion with linear arrays (E-cadherin performed on biopsy). There are foci within the larger lesion that are concerning for lymphovascular space invasion.     04/07/2017 -  Chemotherapy    weekly taxol and herceptin x12 weeks, 04/07/17-06/23/17. Then herceptin q3 weeks for total 1 year which was switched to Kadcyla on 07/14/17.    07/20/2017 - 09/03/2017 Radiation Therapy    She started adjuvant radiation on 07/20/17 with Dr. Lisbeth Renshaw and plans to complete on 09/03/17.     08/24/2017 -  Chemotherapy    maintenance Herceptin    CURRENT THERAPY:  -weekly taxol and herceptin x12 weeks, 04/07/17-06/23/17. Then herceptin q3 weeks for total 1 year which was switched to Kadcyla on 07/14/17, and switchedback to maintenance Herceptin on September 02, 2017 due toworsening peripheral neuropathy.  INTERVAL HISTORY: Ms. Welden returns for follow up and herceptin as scheduled. She underwent repeat echo. She continues to report neuropathy in her hands and feet, difficulty with tasks, and sharp intermittent pains in her feet. She does not seem to be improving, but also not worse. She has difficulty buttoning her blouse and can't type. She does exercises at home she learned from PT for balance. She is on medication per Dr. Mickeal Skinner, has f/u end of November. She has intermittent fatigue. Normal appetite. Chronic constipation she manages with stool softener and  prunes. She had soreness to left low chest below mastectomy site. Wears LE sleeve. She takes anastrozole, gets occasionally "warm" without drenching sweats. Denies fever, chills, cough, chest pain, dyspnea, or leg edema.    MEDICAL HISTORY:  Past Medical History:  Diagnosis Date  . Arthritis    "maybe a little in my left knee" (02/23/2017)  . Asthmatic bronchitis   . Cancer of left breast ([place])   . Coronary atherosclerosis    Minor nonobstructive CAD at cardiac catheterization 2009  . Dyslipidemia   . Femoral bruit 7/08   With normal ABIs  . Hypertension   . Lower extremity edema    Chronic  . Pneumonia ~ 1950   in 8th grade  . Type II diabetes mellitus ([place])     SURGICAL HISTORY: Past Surgical History:  Procedure Laterality Date  . APPENDECTOMY    . BREAST BIOPSY Left 02/2017  . CARDIAC CATHETERIZATION     "long time ago" (02/23/2017)  . MASTECTOMY COMPLETE / SIMPLE W/ SENTINEL NODE BIOPSY Left 02/23/2017   TOTAL MATECTOMY;  LEFT AXILLARY SENTINEL LYMPH NODE BIOPSY ERAS PATHWAY  . MASTECTOMY WITH RADIOACTIVE SEED GUIDED EXCISION AND AXILLARY SENTINEL LYMPH NODE BIOPSY Left 02/23/2017   Procedure: LEFT TOTAL MATECTOMY;  LEFT AXILLARY SENTINEL LYMPH NODE BIOPSY ERAS PATHWAY;  Surgeon: Excell Seltzer, MD;  Location: [facility];  Service: General;  Laterality: Left;  . PILONIDAL CYST EXCISION    . PORTA CATH INSERTION  02/23/2017  . PORTACATH PLACEMENT N/A 02/23/2017   Procedure: INSERTION PORT-A-CATH;  Surgeon: Excell Seltzer, MD;  Location: [facility];  Service: General;  Laterality: N/A;  . SHOULDER ARTHROSCOPY W/ ROTATOR CUFF REPAIR Right     I have reviewed the social history and family history with the patient and they are unchanged from previous note.  ALLERGIES:  has No Known Allergies.  MEDICATIONS:  Current Outpatient Medications  Medication Sig Dispense Refill  . amitriptyline (ELAVIL) 50 MG tablet Take 1 tablet (50 mg total) by mouth at bedtime. 30 tablet 3  .  amLODipine (NORVASC) 5 MG tablet Take 1 tablet (5 mg total) by mouth daily. 30 tablet 2  . anastrozole (ARIMIDEX) 1 MG tablet Take 1 tablet (1 mg total) by mouth daily. 30 tablet 1  . aspirin 81 MG EC tablet Take 81 mg by mouth at bedtime.     . chlorthalidone (HYGROTON) 25 MG tablet Take 25 mg by mouth daily.     . Coenzyme Q10 (COQ10) 100 MG CAPS Take 100 mg by mouth daily.      Marland Kitchen gabapentin (NEURONTIN) 300 MG capsule Take 3 capsules (900 mg total) by mouth 3 (three) times daily. 120 capsule 3  . HYDROcodone-acetaminophen (NORCO/VICODIN) 5-325 MG tablet Take 1 tablet by mouth every 6 (six) hours as needed for moderate pain. 40 tablet 0  . latanoprost (XALATAN) 0.005 % ophthalmic solution Place 1 drop into both eyes at bedtime.    . lidocaine-prilocaine (EMLA) cream APPLY CREAM TOPICALLY TO AFFECTED AREA ONCE  3  . losartan (COZAAR) 50 MG tablet Take 50 mg by mouth daily.    . metFORMIN (GLUCOPHAGE) 500 MG tablet Take 1,000 mg by mouth daily with breakfast.     . potassium chloride SA (K-DUR,KLOR-CON) 20 MEQ tablet Take 1 tablet (20 mEq total) by mouth 2 (two) times daily. 60 tablet 1  . vitamin B-12 (CYANOCOBALAMIN) 1000 MCG tablet Take 1,000 mcg by mouth daily.     No current facility-administered medications for this visit.    Facility-Administered Medications Ordered in Other Visits  Medication Dose Route Frequency Provider Last Rate Last Dose  . sodium chloride flush (NS) 0.9 % injection 10 mL  10 mL Intracatheter PRN Truitt Merle, MD   10 mL at 11/03/17 1436    PHYSICAL EXAMINATION: ECOG PERFORMANCE STATUS: 2 - Symptomatic, <50% confined to bed  Vitals:   11/03/17 1143  BP: (!) 154/81  Pulse: 86  Resp: 18  Temp: 98.5 F (36.9 C)  SpO2: 100%   Filed Weights   11/03/17 1143  Weight: 157 lb 1.6 oz (71.3 kg)    GENERAL:alert, no distress and comfortable SKIN: no rashes or significant lesions EYES:  sclera clear OROPHARYNX:no thrush or ulcers  LYMPH:  no palpable cervical or  supraclavicular lymphadenopathy  LUNGS: clear to auscultation with normal breathing effort HEART: regular rate & rhythm, no lower extremity edema ABDOMEN: abdomen soft, non-tender and normal bowel sounds Musculoskeletal:no cyanosis of digits and no clubbing  NEURO: alert & oriented x 3 with fluent speech, no focal motor deficits. Moderately decreased peripheral vibratory sense per tuning fork exam Breast: s/p left mastectomy and radiation. Diffuse moderate hyperpigmentation to left anterolateral chest and mild to  upper back. Incision is closed, well healed. Mild axillary lymphedema. No palpable mass. Right breast exam deferred.  PAC without erythema    LABORATORY DATA:  I have reviewed the data as listed CBC Latest Ref Rng & Units 11/03/2017 10/13/2017 09/23/2017  WBC 3.9 - 10.3 K/uL 4.2 3.4(L) 2.9(L)  Hemoglobin 11.6 - 15.9 g/dL 10.9(L) 10.0(L) 10.9(L)  Hematocrit 34.8 - 46.6 % 33.9(L) 30.7(L) 33.1(L)  Platelets 145 - 400 K/uL 211 303 244     CMP Latest Ref Rng & Units 11/03/2017 10/13/2017 09/23/2017  Glucose 70 - 99 mg/dL 158(H) 103(H) 103(H)  BUN 8 - 23 mg/dL  _0 Creatinine 0.44 - 1.00 mg/dL 0.75 0.73 0.73  Sodium 135 - 145 mmol/L 131(L) 133(L) 130(L)  Potassium 3.5 - 5.1 mmol/L 3.7 3.9 3.8  Chloride 98 - 111 mmol/L 95(L) 97(L) 94(L)  CO2 22 - 32 mmol/L _1 Calcium 8.9 - 10.3 mg/dL 9.5 9.4 9.7  Total Protein 6.5 - 8.1 g/dL 6.7 6.7 7.1  Total Bilirubin 0.3 - 1.2 mg/dL 0.7 0.5 0.5  Alkaline Phos 38 - 126 U/L 65 59 63  AST 15 - 41 U/L _2 ALT 0 - 44 U/L _3 RADIOGRAPHIC STUDIES: I have personally reviewed the radiological images as listed and agreed with the findings in the report. No results found.   ASSESSMENT & PLAN: Penny Huynh is an74 y.o.female with past medical history positive for HTN, DM, CAD now with newly diagnosed left breast cancer  1.Multifocal invasive ductal carcinoma of the left breast of female;carcinoma of the upper-outer  quadrant of left breast and female, ER/PR positive, HER-2 negative pT2N0M0, G2, Stage 1B; IDC of the upper-inner quadrant left breast, ER+/PR+/HER2+, pT3N0M0 G3, stage IB 2. HTN, CAD, DM -07/08/17 echo with EF 55% -10/27/17 EF 55-60% 3. Hyponatremia, hypokalemia  4. Peripheral neuropathy, G2-3, secondary to taxol, DM and T-DM1  Ms. Amero appears stable. She completed another cycle of maintenance herceptin. She tolerates well. Her repeat echo is stable with EF 55-60%. She continues to f/u with cardiology. Her neuropathy is stable, not improving on current regimen with gabapentin and amitriptyline. I recommend she see Dr. Mickeal Skinner sooner, she will call. CBC and CMP reviewed. She will proceed with next cycle herceptin today, she continues anastrozole. She will return for f/u and next cycle in 3 weeks.   She wishes to stop taking oral potassium. K is 3.7 today. She can stop for now, but if she becomes hypokalemic in the future we would recommend she restart. I suggest she increase potassium rich foods in her diet. She understands.   All questions were answered. The patient knows to call the clinic with any problems, questions or concerns. No barriers to learning was detected.     Alla Feeling, NP 11/03/17

## 2017-11-03 ENCOUNTER — Inpatient Hospital Stay (HOSPITAL_BASED_OUTPATIENT_CLINIC_OR_DEPARTMENT_OTHER): Payer: Medicare Other | Admitting: Nurse Practitioner

## 2017-11-03 ENCOUNTER — Inpatient Hospital Stay: Payer: Medicare Other

## 2017-11-03 ENCOUNTER — Encounter: Payer: Self-pay | Admitting: Nurse Practitioner

## 2017-11-03 ENCOUNTER — Telehealth: Payer: Self-pay

## 2017-11-03 ENCOUNTER — Inpatient Hospital Stay: Payer: Medicare Other | Attending: Hematology

## 2017-11-03 VITALS — BP 154/81 | HR 86 | Temp 98.5°F | Resp 18 | Ht 64.5 in | Wt 157.1 lb

## 2017-11-03 DIAGNOSIS — J45909 Unspecified asthma, uncomplicated: Secondary | ICD-10-CM | POA: Diagnosis not present

## 2017-11-03 DIAGNOSIS — K5909 Other constipation: Secondary | ICD-10-CM | POA: Diagnosis not present

## 2017-11-03 DIAGNOSIS — E876 Hypokalemia: Secondary | ICD-10-CM | POA: Insufficient documentation

## 2017-11-03 DIAGNOSIS — I723 Aneurysm of iliac artery: Secondary | ICD-10-CM | POA: Insufficient documentation

## 2017-11-03 DIAGNOSIS — E785 Hyperlipidemia, unspecified: Secondary | ICD-10-CM | POA: Diagnosis not present

## 2017-11-03 DIAGNOSIS — Z170 Estrogen receptor positive status [ER+]: Secondary | ICD-10-CM | POA: Insufficient documentation

## 2017-11-03 DIAGNOSIS — E1142 Type 2 diabetes mellitus with diabetic polyneuropathy: Secondary | ICD-10-CM | POA: Diagnosis not present

## 2017-11-03 DIAGNOSIS — M1990 Unspecified osteoarthritis, unspecified site: Secondary | ICD-10-CM | POA: Diagnosis not present

## 2017-11-03 DIAGNOSIS — N951 Menopausal and female climacteric states: Secondary | ICD-10-CM | POA: Insufficient documentation

## 2017-11-03 DIAGNOSIS — I700 Atherosclerosis of aorta: Secondary | ICD-10-CM | POA: Diagnosis not present

## 2017-11-03 DIAGNOSIS — I10 Essential (primary) hypertension: Secondary | ICD-10-CM | POA: Diagnosis not present

## 2017-11-03 DIAGNOSIS — Z79899 Other long term (current) drug therapy: Secondary | ICD-10-CM

## 2017-11-03 DIAGNOSIS — C50412 Malignant neoplasm of upper-outer quadrant of left female breast: Secondary | ICD-10-CM | POA: Diagnosis not present

## 2017-11-03 DIAGNOSIS — T451X5A Adverse effect of antineoplastic and immunosuppressive drugs, initial encounter: Secondary | ICD-10-CM | POA: Diagnosis not present

## 2017-11-03 DIAGNOSIS — Z5111 Encounter for antineoplastic chemotherapy: Secondary | ICD-10-CM | POA: Diagnosis not present

## 2017-11-03 DIAGNOSIS — Z7982 Long term (current) use of aspirin: Secondary | ICD-10-CM | POA: Insufficient documentation

## 2017-11-03 DIAGNOSIS — E871 Hypo-osmolality and hyponatremia: Secondary | ICD-10-CM

## 2017-11-03 DIAGNOSIS — C50212 Malignant neoplasm of upper-inner quadrant of left female breast: Secondary | ICD-10-CM | POA: Insufficient documentation

## 2017-11-03 DIAGNOSIS — G620 Drug-induced polyneuropathy: Secondary | ICD-10-CM

## 2017-11-03 DIAGNOSIS — I2510 Atherosclerotic heart disease of native coronary artery without angina pectoris: Secondary | ICD-10-CM | POA: Insufficient documentation

## 2017-11-03 DIAGNOSIS — J439 Emphysema, unspecified: Secondary | ICD-10-CM | POA: Insufficient documentation

## 2017-11-03 DIAGNOSIS — Z794 Long term (current) use of insulin: Secondary | ICD-10-CM | POA: Insufficient documentation

## 2017-11-03 DIAGNOSIS — Z95828 Presence of other vascular implants and grafts: Secondary | ICD-10-CM

## 2017-11-03 LAB — CBC WITH DIFFERENTIAL (CANCER CENTER ONLY)
Basophils Absolute: 0 10*3/uL (ref 0.0–0.1)
Basophils Relative: 1 %
EOS ABS: 0.2 10*3/uL (ref 0.0–0.5)
Eosinophils Relative: 4 %
HEMATOCRIT: 33.9 % — AB (ref 34.8–46.6)
HEMOGLOBIN: 10.9 g/dL — AB (ref 11.6–15.9)
Lymphocytes Relative: 25 %
Lymphs Abs: 1 10*3/uL (ref 0.9–3.3)
MCH: 28.8 pg (ref 25.1–34.0)
MCHC: 32.2 g/dL (ref 31.5–36.0)
MCV: 89.4 fL (ref 79.5–101.0)
Monocytes Absolute: 0.5 10*3/uL (ref 0.1–0.9)
Monocytes Relative: 11 %
NEUTROS ABS: 2.5 10*3/uL (ref 1.5–6.5)
NEUTROS PCT: 59 %
Platelet Count: 211 10*3/uL (ref 145–400)
RBC: 3.79 MIL/uL (ref 3.70–5.45)
RDW: 15.1 % — AB (ref 11.2–14.5)
WBC Count: 4.2 10*3/uL (ref 3.9–10.3)

## 2017-11-03 LAB — CMP (CANCER CENTER ONLY)
ALK PHOS: 65 U/L (ref 38–126)
ALT: 17 U/L (ref 0–44)
ANION GAP: 6 (ref 5–15)
AST: 22 U/L (ref 15–41)
Albumin: 3.4 g/dL — ABNORMAL LOW (ref 3.5–5.0)
BILIRUBIN TOTAL: 0.7 mg/dL (ref 0.3–1.2)
BUN: 9 mg/dL (ref 8–23)
CALCIUM: 9.5 mg/dL (ref 8.9–10.3)
CO2: 30 mmol/L (ref 22–32)
Chloride: 95 mmol/L — ABNORMAL LOW (ref 98–111)
Creatinine: 0.75 mg/dL (ref 0.44–1.00)
Glucose, Bld: 158 mg/dL — ABNORMAL HIGH (ref 70–99)
Potassium: 3.7 mmol/L (ref 3.5–5.1)
Sodium: 131 mmol/L — ABNORMAL LOW (ref 135–145)
TOTAL PROTEIN: 6.7 g/dL (ref 6.5–8.1)

## 2017-11-03 MED ORDER — SODIUM CHLORIDE 0.9% FLUSH
10.0000 mL | INTRAVENOUS | Status: DC | PRN
  Administered 2017-11-03: 10 mL
  Filled 2017-11-03: qty 10

## 2017-11-03 MED ORDER — ACETAMINOPHEN 325 MG PO TABS
650.0000 mg | ORAL_TABLET | Freq: Once | ORAL | Status: AC
  Administered 2017-11-03: 650 mg via ORAL

## 2017-11-03 MED ORDER — DIPHENHYDRAMINE HCL 25 MG PO CAPS
50.0000 mg | ORAL_CAPSULE | Freq: Once | ORAL | Status: AC
  Administered 2017-11-03: 50 mg via ORAL

## 2017-11-03 MED ORDER — TRASTUZUMAB CHEMO 150 MG IV SOLR
450.0000 mg | Freq: Once | INTRAVENOUS | Status: AC
  Administered 2017-11-03: 450 mg via INTRAVENOUS
  Filled 2017-11-03: qty 21.43

## 2017-11-03 MED ORDER — DIPHENHYDRAMINE HCL 25 MG PO CAPS
ORAL_CAPSULE | ORAL | Status: AC
  Filled 2017-11-03: qty 2

## 2017-11-03 MED ORDER — SODIUM CHLORIDE 0.9 % IV SOLN
Freq: Once | INTRAVENOUS | Status: AC
  Administered 2017-11-03: 13:00:00 via INTRAVENOUS
  Filled 2017-11-03: qty 250

## 2017-11-03 MED ORDER — HEPARIN SOD (PORK) LOCK FLUSH 100 UNIT/ML IV SOLN
500.0000 [IU] | Freq: Once | INTRAVENOUS | Status: AC | PRN
  Administered 2017-11-03: 500 [IU]
  Filled 2017-11-03: qty 5

## 2017-11-03 MED ORDER — ACETAMINOPHEN 325 MG PO TABS
ORAL_TABLET | ORAL | Status: AC
  Filled 2017-11-03: qty 2

## 2017-11-03 NOTE — Telephone Encounter (Signed)
Printed avs and calender of upcoming per 10/2 los

## 2017-11-03 NOTE — Telephone Encounter (Signed)
Patient contacted the office to report that her symptoms r/t neuropathy have not improved with the medication changes and would like to follow up with Dr. Mickeal Skinner prior to the 11/22 scheduled appointment. After speaking with Dr. Mickeal Skinner a follow up appointment was scheduled for 10/9, patient aware and appreciative.

## 2017-11-03 NOTE — Patient Instructions (Signed)
[facility] Discharge Instructions for Patients Receiving Chemotherapy  Today you received the following chemotherapy agents: Trastuzumab (Herceptin).  To help prevent nausea and vomiting after your treatment, we encourage you to take your nausea medication as prescribed. If you develop nausea and vomiting that is not controlled by your nausea medication, call the clinic.   BELOW ARE SYMPTOMS THAT SHOULD BE REPORTED IMMEDIATELY:  *FEVER GREATER THAN 100.5 F  *CHILLS WITH OR WITHOUT FEVER  NAUSEA AND VOMITING THAT IS NOT CONTROLLED WITH YOUR NAUSEA MEDICATION  *UNUSUAL SHORTNESS OF BREATH  *UNUSUAL BRUISING OR BLEEDING  TENDERNESS IN MOUTH AND THROAT WITH OR WITHOUT PRESENCE OF ULCERS  *URINARY PROBLEMS  *BOWEL PROBLEMS  UNUSUAL RASH Items with * indicate a potential emergency and should be followed up as soon as possible.  Feel free to call the clinic should you have any questions or concerns. The clinic phone number is [phone].  Please show the CHEMO ALERT CARD at check-in to the Emergency Department and triage nurse.   

## 2017-11-03 NOTE — Progress Notes (Signed)
Spoke w/ [name], increase dose of Herceptin for weight gain. Orders updated.   Penny Huynh, PharmD, [facility] Phone: [phone] 11/03/2017

## 2017-11-04 ENCOUNTER — Other Ambulatory Visit: Payer: Self-pay | Admitting: Internal Medicine

## 2017-11-04 MED ORDER — GABAPENTIN 300 MG PO CAPS: 900.0000 mg | ORAL_CAPSULE | Freq: Three times a day (TID) | ORAL | 3 refills | Status: DC

## 2017-11-10 ENCOUNTER — Inpatient Hospital Stay (HOSPITAL_BASED_OUTPATIENT_CLINIC_OR_DEPARTMENT_OTHER): Payer: Medicare Other | Admitting: Internal Medicine

## 2017-11-10 ENCOUNTER — Telehealth: Payer: Self-pay | Admitting: Cardiovascular Disease

## 2017-11-10 VITALS — BP 156/79 | HR 87 | Temp 98.5°F | Resp 18 | Ht 64.5 in | Wt 160.8 lb

## 2017-11-10 DIAGNOSIS — G620 Drug-induced polyneuropathy: Secondary | ICD-10-CM

## 2017-11-10 DIAGNOSIS — E876 Hypokalemia: Secondary | ICD-10-CM

## 2017-11-10 DIAGNOSIS — Z79899 Other long term (current) drug therapy: Secondary | ICD-10-CM

## 2017-11-10 DIAGNOSIS — E871 Hypo-osmolality and hyponatremia: Secondary | ICD-10-CM

## 2017-11-10 DIAGNOSIS — C50412 Malignant neoplasm of upper-outer quadrant of left female breast: Secondary | ICD-10-CM | POA: Diagnosis not present

## 2017-11-10 DIAGNOSIS — C50212 Malignant neoplasm of upper-inner quadrant of left female breast: Secondary | ICD-10-CM

## 2017-11-10 DIAGNOSIS — Z170 Estrogen receptor positive status [ER+]: Secondary | ICD-10-CM | POA: Diagnosis not present

## 2017-11-10 DIAGNOSIS — T451X5A Adverse effect of antineoplastic and immunosuppressive drugs, initial encounter: Principal | ICD-10-CM

## 2017-11-10 DIAGNOSIS — Z5111 Encounter for antineoplastic chemotherapy: Secondary | ICD-10-CM | POA: Diagnosis not present

## 2017-11-10 MED ORDER — PREGABALIN 75 MG PO CAPS: 150.0000 mg | ORAL_CAPSULE | Freq: Two times a day (BID) | ORAL | 1 refills | Status: DC

## 2017-11-10 NOTE — Progress Notes (Signed)
[facility] [place],  [address] [phone]   Interval Evaluation  Date of Service: 11/10/17 Patient Name: Penny Huynh Patient MRN: [id number] Patient DOB: January 24, 1934 Provider: Ventura Sellers, MD  Identifying Statement:  Penny Huynh is a 84 y.o. female with Chemotherapy-induced peripheral neuropathy ([place]) [G62.0, T45.1X5A]   Oncologic History: Oncology History   Cancer Staging Carcinoma of upper-inner quadrant of left breast in female, estrogen receptor positive ([place]) Staging form: Breast, AJCC 8th Edition - Clinical stage from 01/18/2017: Stage IB (cT2, cN0, cM0, G3, ER: Positive, PR: Positive, HER2: Positive) - Signed by Truitt Merle, MD on 02/10/2017 - Pathologic stage from 02/23/2017: Stage IB (pT3, pN0, cM0, G3, ER: Positive, PR: Positive, HER2: Positive) - Signed by Alla Feeling, NP on 03/16/2017  Carcinoma of upper-outer quadrant of left breast in female, estrogen receptor positive ([place]) Staging form: Breast, AJCC 8th Edition - Clinical stage from 01/18/2017: Stage IB (cT2, cN0, cM0, G2, ER: Positive, PR: Positive, HER2: Negative) - Unsigned - Pathologic stage from 02/23/2017: Stage IB (pT2, pN0(sn), cM0, G3, ER: Positive, PR: Positive, HER2: Negative) - Signed by Alla Feeling, NP on 03/16/2017       Carcinoma of upper-outer quadrant of left breast in female, estrogen receptor positive ([place])   01/13/2017 Mammogram    FINDINGS: In the left breast, a spiculated mass lies in the upper outer quadrant. In the medial posterior left breast, there is a larger lobulated mass. These masses correspond to the palpable abnormalities. No other discrete left breast masses.  In the right breast, there are no discrete masses. There are no areas of architectural distortion.  In both breasts there are scattered calcifications, rounded punctate, with a few other larger coarse dystrophic calcifications, without  significant change from the previous screening mammogram, which is dated 12/04/2009.  Mammographic images were processed with CAD.  On physical exam, there is a firm palpable mass in the upper outer left breast and another firm mass in the medial left breast.     01/13/2017 Breast US    Targeted ultrasound is performed, showing an irregular hypoechoic shadowing mass in the left breast at the 2:30 o'clock position, 5 cm the nipple, measuring 3.3 x 2.8 x 2.6 cm. In the 10:30 o'clock position of the left breast, 5 cm the nipple, there is irregular hypoechoic mass with somewhat more lobulated margins, corresponding to the lobulated mass seen mammographically. This measures 4.8 x 2.9 x 4.8 cm. Both masses show internal vascularity on color Doppler analysis.  Sonographic evaluation of the left axilla shows several nodes with thickened cortices. Status cortex measured is 5 mm. None of these lymph nodes appear enlarged but overall size criteria.  IMPRESSION: 1. Two masses in the left breast, 1 at the 2:30 o'clock position in the other at the 10:30 o'clock position, both highly suspicious for breast carcinoma. 2. Several abnormal left axillary lymph nodes with thickened cortices. 3. No evidence of right breast malignancy.     01/18/2017 Pathology Results    Diagnosis 1. Breast, left, needle core biopsy, 2:30 o'clock - INVASIVE MAMMARY CARCINOMA - MAMMARY CARCINOMA IN-SITU - SEE COMMENT 2. Lymph node, needle/core biopsy, left axilla - NO CARCINOMA IDENTIFIED - SEE COMMENT 3. Breast, left, needle core biopsy, 10:30 o'clock - INVASIVE DUCTAL CARCINOMA - SEE COMMENT  1. PROGNOSTIC INDICATORS Results: IMMUNOHISTOCHEMICAL AND MORPHOMETRIC ANALYSIS PERFORMED MANUALLY Estrogen Receptor: 100%, POSITIVE, STRONG STAINING INTENSITY Progesterone Receptor: 60%, POSITIVE, STRONG STAINING INTENSITY Proliferation  Marker Ki67: 60%  1. FLUORESCENCE IN-SITU HYBRIDIZATION Results: HER2  - NEGATIVE RATIO OF HER2/CEP17 SIGNALS 1.28 AVERAGE HER2 COPY NUMBER PER CELL 2.75  3. PROGNOSTIC INDICATORS Results: IMMUNOHISTOCHEMICAL AND MORPHOMETRIC ANALYSIS PERFORMED MANUALLY Estrogen Receptor: 100%, POSITIVE, STRONG STAINING INTENSITY Progesterone Receptor: 50%, POSITIVE, STRONG STAINING INTENSITY Proliferation Marker Ki67: 70%  3. FLUORESCENCE IN-SITU HYBRIDIZATION Results: HER2 - **POSITIVE** RATIO OF HER2/CEP17 SIGNALS 2.80 AVERAGE HER2 COPY NUMBER PER CELL 6.15  Microscopic Comment 1. The biopsy material shows an infiltrative proliferation of cells with large vesicular nuclei with inconspicuous nucleoli, arranged linearly and in small clusters. Based on the biopsy, the carcinoma appears Nottingham grade 2 of 3 and measures 0.9 cm in greatest linear extent. E-cadherin and prognostic markers (ER/PR/ki-67/HER2-FISH)are pending and will be reported in an addendum. Dr. Lyndon Code reviewed the case and agrees with the above diagnosis.    02/10/2017 Initial Diagnosis    Carcinoma of upper-outer quadrant of left breast in female, estrogen receptor positive ([place])    02/19/2017 Imaging    Bone Scan whole Body 02/19/17 IMPRESSION: Negative for evidence of bony metastatic disease.    02/19/2017 Imaging    CT CAP W Contrast 02/19/17 IMPRESSION: 1. Small right middle lobe pulmonary nodules up to 0.8 cm, nonspecific. Non-contrast chest CT at 3-6 months is recommended. If the nodules are stable at time of repeat CT, then future CT at 18-24 months (from today's scan) is considered optional for low-risk patients, but is recommended for high-risk patients. This recommendation follows the consensus statement: Guidelines for Management of Incidental Pulmonary Nodules Detected on CT Images: From the Fleischner Society 2017; Radiology 2017; 284:228-243. 2. Subcentimeter hepatic lesions likely cysts but incompletely characterized due to small size. Attention on follow-up imaging recommended. 3.  2.1 cm fusiform right common iliac artery aneurysm. Continued surveillance recommended. 4. Aortic Atherosclerosis (ICD10-I70.0) and Emphysema (ICD10-J43.9).    02/23/2017 Pathology Results    Diagnosis 1. Breast, simple mastectomy, Left Total - INVASIVE DUCTAL CARCINOMA, MULTIFOCAL, NOTTINGHAM GRADE 3/3 (5.3 CM, 3.5 CM) - INVASIVE CARCINOMA INVOLVES THE DERMIS - DUCTAL CARCINOMA IN SITU, INTERMEDIATE GRADE - HYALINIZED FIBROADENOMA - MARGINS UNINVOLVED BY CARCINOMA - NO CARCINOMA IDENTIFIED IN TWO LYMPH NODES (0/2) - SEE ONCOLOGY TABLE AND COMMENT BELOW 2. Lymph node, sentinel, biopsy, Left Axillary - NO CARCINOMA IDENTIFIED IN ONE LYMPH NODE (0/1) 3. Lymph node, sentinel, biopsy - NO CARCINOMA IDENTIFIED IN ONE LYMPH NODE (0/1) 4. Lymph node, sentinel, biopsy - NO CARCINOMA IDENTIFIED IN ONE LYMPH NODE (0/1) 5. Lymph node, sentinel, biopsy - NO CARCINOMA IDENTIFIED IN ONE LYMPH NODE (0/1) Microscopic Comment 1. BREAST, INVASIVE TUMOR Procedure: Simple mastectomy Laterality: Left Tumor Size: 5. 3 cm, 3.5 cm Histologic Type: Invasive carcinoma of no special type (ductal, not otherwise specified) Grade: Nottingham Grade 3 Tubular Differentiation: 3 Nuclear Pleomorphism: 3 Mitotic Count: 2 Ductal Carcinoma in Situ (DCIS): Present Extent of Tumor: Skin: Invasive carcinoma directly invades into the dermis or epidermis without skin ulceration Margins: Invasive carcinoma, distance from closest margin: 1.5 cm (posterior) DCIS, distance from closest margin: > 1 cm Regional Lymph Nodes: Number of Lymph Nodes Examined: 6 Number of Sentinel Lymph Nodes Examined: 4 Lymph Nodes with Macrometastases: 0 Lymph Nodes with Micrometastases: 0 Lymph Nodes with Isolated Tumor Cells: 0 Treatment effect: No known presurgical therapy Breast Prognostic Profile: See Also ([id number]) Estrogen Receptor: Positive (100%, strong); Positive (100%, strong) Progesterone Receptor: Positive (50%,  strong); Positive (50%, strong) Her2: Positive (Ratio 2.80); Negative (Ratio 1.28) Ki-67: 70%; 60% Best tumor block for sendout testing:  1E Pathologic Stage Classification (pTNM, AJCC 8th Edition): Primary Tumor: mpT3 Regional Lymph Nodes: pN0 COMMENT: The two invasive carcinomas in the breast have slightly different morphologies. The larger lesion has a papillary and micropapillary pattern admixed with typical ductal carcinoma while the smaller lesion is more typical of a ductal lesion with linear arrays (E-cadherin performed on biopsy). There are foci within the larger lesion that are concerning for lymphovascular space invasion.     04/07/2017 -  Chemotherapy    weekly taxol and herceptin x12 weeks, 04/07/17-06/23/17. Then herceptin q3 weeks for total 1 year which was switched to Kadcyla on 07/14/17.    07/20/2017 - 09/03/2017 Radiation Therapy    She started adjuvant radiation on 07/20/17 with Dr. Lisbeth Renshaw and plans to complete on 09/03/17.     08/24/2017 -  Chemotherapy    maintenance Herceptin      Interval History:  Penny Huynh presents today for neuropathy follow up.  She does not describe any improvement in pain or numbness since our prior visit.  Still feels somewhat off balance because "can't feel where my feet are". Does complain of some dry mouth with the Elavil.  H+P: presents today to discuss her neuropathy symptoms.  She describes painful burning affecting her feet and lower to mid-shins symmetrically.  This started sometime after taxol chemotherapy was started in March (continued through May) and has persisted or even progressed.  At this time she also describes feeling of numbness and tingling affecting her finger tips on both hands.  Her walking is impaired because of poor sensation and balance, she is requiring a walker fora ambulation.  There is also noted swelling of both feet.  She does acknowledge a milder version of these symptoms, affecting the bottoms of her feet only, which  were present prior to the onset of chemotherapy.  She has long-standing diagnosis of diabetes.  Presently she is receiving Herceptin and no further cytotoxic chemotherapy.  Taking Gabapentin 674m 3x day without efficacy.  Medications: Current Outpatient Medications on File Prior to Visit  Medication Sig Dispense Refill  . amitriptyline (ELAVIL) 50 MG tablet Take 1 tablet (50 mg total) by mouth at bedtime. 30 tablet 3  . amLODipine (NORVASC) 5 MG tablet Take 1 tablet (5 mg total) by mouth daily. 30 tablet 2  . anastrozole (ARIMIDEX) 1 MG tablet Take 1 tablet (1 mg total) by mouth daily. 30 tablet 1  . aspirin 81 MG EC tablet Take 81 mg by mouth at bedtime.     . chlorthalidone (HYGROTON) 25 MG tablet Take 25 mg by mouth daily.     . Coenzyme Q10 (COQ10) 100 MG CAPS Take 100 mg by mouth daily.      .Marland Kitchengabapentin (NEURONTIN) 300 MG capsule Take 3 capsules (900 mg total) by mouth 3 (three) times daily. 120 capsule 3  . HYDROcodone-acetaminophen (NORCO/VICODIN) 5-325 MG tablet Take 1 tablet by mouth every 6 (six) hours as needed for moderate pain. 40 tablet 0  . latanoprost (XALATAN) 0.005 % ophthalmic solution Place 1 drop into both eyes at bedtime.    . lidocaine-prilocaine (EMLA) cream APPLY CREAM TOPICALLY TO AFFECTED AREA ONCE  3  . losartan (COZAAR) 50 MG tablet Take 50 mg by mouth daily.    . metFORMIN (GLUCOPHAGE) 500 MG tablet Take 1,000 mg by mouth daily with breakfast.     . potassium chloride SA (K-DUR,KLOR-CON) 20 MEQ tablet Take 1 tablet (20 mEq total) by mouth 2 (two) times daily. 60 tablet 1  .  vitamin B-12 (CYANOCOBALAMIN) 1000 MCG tablet Take 1,000 mcg by mouth daily.    . [DISCONTINUED] prochlorperazine (COMPAZINE) 10 MG tablet Take 1 tablet (10 mg total) by mouth every 6 (six) hours as needed (Nausea or vomiting). 30 tablet 1   No current facility-administered medications on file prior to visit.     Allergies: No Known Allergies Past Medical History:  Past Medical History:    Diagnosis Date  . Arthritis    "maybe a little in my left knee" (02/23/2017)  . Asthmatic bronchitis   . Cancer of left breast ([place])   . Coronary atherosclerosis    Minor nonobstructive CAD at cardiac catheterization 2009  . Dyslipidemia   . Femoral bruit 7/08   With normal ABIs  . Hypertension   . Lower extremity edema    Chronic  . Pneumonia ~ 1950   in 8th grade  . Type II diabetes mellitus ([place])    Past Surgical History:  Past Surgical History:  Procedure Laterality Date  . APPENDECTOMY    . BREAST BIOPSY Left 02/2017  . CARDIAC CATHETERIZATION     "long time ago" (02/23/2017)  . MASTECTOMY COMPLETE / SIMPLE W/ SENTINEL NODE BIOPSY Left 02/23/2017   TOTAL MATECTOMY;  LEFT AXILLARY SENTINEL LYMPH NODE BIOPSY ERAS PATHWAY  . MASTECTOMY WITH RADIOACTIVE SEED GUIDED EXCISION AND AXILLARY SENTINEL LYMPH NODE BIOPSY Left 02/23/2017   Procedure: LEFT TOTAL MATECTOMY;  LEFT AXILLARY SENTINEL LYMPH NODE BIOPSY ERAS PATHWAY;  Surgeon: Excell Seltzer, MD;  Location: [facility];  Service: General;  Laterality: Left;  . PILONIDAL CYST EXCISION    . PORTA CATH INSERTION  02/23/2017  . PORTACATH PLACEMENT N/A 02/23/2017   Procedure: INSERTION PORT-A-CATH;  Surgeon: Excell Seltzer, MD;  Location: [facility];  Service: General;  Laterality: N/A;  . SHOULDER ARTHROSCOPY W/ ROTATOR CUFF REPAIR Right    Social History:  Social History   Socioeconomic History  . Marital status: Married    Spouse name: Not on file  . Number of children: 2  . Years of education: Not on file  . Highest education level: Not on file  Occupational History  . Occupation: Counselling psychologist for [employer]    Comment: only works during tax season but not currently   . Occupation: in Dentist   . Occupation: retired Mining engineer of education     Comment: [employer]  . Financial resource strain: Not on file  . Food insecurity:    Worry: Not on file    Inability: Not on file  . Transportation needs:    Medical: Not on  file    Non-medical: Not on file  Tobacco Use  . Smoking status: Former Smoker    Packs/day: 2.00    Years: 46.00    Pack years: 92.00    Types: Cigarettes    Start date: 11/09/1953    Last attempt to quit: 2000    Years since quitting: 19.7  . Smokeless tobacco: Never Used  Substance and Sexual Activity  . Alcohol use: Yes    Alcohol/week: 2.0 standard drinks    Types: 2 Glasses of wine per week  . Drug use: No  . Sexual activity: Not on file  Lifestyle  . Physical activity:    Days per week: Not on file    Minutes per session: Not on file  . Stress: Not on file  Relationships  . Social connections:    Talks on phone: Not on file    Gets together:  Not on file    Attends religious service: Not on file    Active member of club or organization: Not on file    Attends meetings of clubs or organizations: Not on file    Relationship status: Not on file  . Intimate partner violence:    Fear of current or ex partner: Not on file    Emotionally abused: Not on file    Physically abused: Not on file    Forced sexual activity: Not on file  Other Topics Concern  . Not on file  Social History Narrative   Lives in [place] with her husband.   Has 2 grown children.   10-25-17 Unable to ask abuse questions husband with her today.   Family History:  Family History  Problem Relation Age of Onset  . Diabetes Mother   . Stroke Father   . Cancer Father        unknown type     Review of Systems: Constitutional: Denies fevers, chills or abnormal weight loss Eyes: Denies blurriness of vision Ears, nose, mouth, throat, and face: Denies mucositis or sore throat Respiratory: Denies cough, dyspnea or wheezes Cardiovascular: Denies palpitation, chest discomfort or lower extremity swelling Gastrointestinal:  Denies nausea, constipation, diarrhea GU: Denies dysuria or incontinence Skin: Denies abnormal skin rashes Neurological: Per HPI Musculoskeletal: +knee pain Behavioral/Psych: Denies  anxiety, disturbance in thought content, and mood instability   Physical Exam: Vitals:   11/10/17 1008  BP: (!) 156/79  Pulse: 87  Resp: 18  Temp: 98.5 F (36.9 C)  SpO2: 97%    KPS: 60. General: Alert, cooperative, pleasant, in no acute distress Head: Craniotomy scar noted, dry and intact. EENT: No conjunctival injection or scleral icterus. Oral mucosa moist Lungs: Resp effort normal Cardiac: Regular rate and rhythm Abdomen: Soft, non-distended abdomen Skin: No rashes cyanosis or petechiae. Extremities: No clubbing or edema  Neurologic Exam: Mental Status: Awake, alert, attentive to examiner. Oriented to self and environment. Language is fluent with intact comprehension.  Cranial Nerves: Visual acuity is grossly normal. Visual fields are full. Extra-ocular movements intact. No ptosis. Face is symmetric, tongue midline. Motor: Tone and bulk are normal. Power is full in both arms and legs. Reflexes are decreased bilaterally, no pathologic reflexes present. Intact finger to nose bilaterally Sensory: Impaired to light touch and temp lower legs and fingertips Gait: Deferred   Labs: I have reviewed the data as listed    Component Value Date/Time   NA 131 (L) 11/03/2017 1056   K 3.7 11/03/2017 1056   CL 95 (L) 11/03/2017 1056   CO2 30 11/03/2017 1056   GLUCOSE 158 (H) 11/03/2017 1056   BUN 9 11/03/2017 1056   CREATININE 0.75 11/03/2017 1056   CALCIUM 9.5 11/03/2017 1056   PROT 6.7 11/03/2017 1056   ALBUMIN 3.4 (L) 11/03/2017 1056   AST 22 11/03/2017 1056   ALT 17 11/03/2017 1056   ALKPHOS 65 11/03/2017 1056   BILITOT 0.7 11/03/2017 1056   GFRNONAA >60 11/03/2017 1056   GFRAA >60 11/03/2017 1056   Lab Results  Component Value Date   WBC 4.2 11/03/2017   NEUTROABS 2.5 11/03/2017   HGB 10.9 (L) 11/03/2017   HCT 33.9 (L) 11/03/2017   MCV 89.4 11/03/2017   PLT 211 11/03/2017      Assessment/Plan 1. Chemotherapy-induced peripheral neuropathy [facility])  Ms. Wescott  has a symmetric distal polyneuropathy which is secondary to taxol exposure combined with likely pre-existing diabetic neuropathy.  There is evidence of both small  and large fiber involvement.    At this time she is considered refractory having failed both high dose gabapentin and amytryptilline.  We recommended and will order Lyrica 75 BID for one week, then increase to 137m BID.  Once she obtains Lyrica she should discontinue Gabapentin due to redundant mechanism.  She can continue Elavil in the interim. We strongly recommended initiating physical therapy for gait and ambulation rehabilitation.      We appreciate the opportunity to participate in the care of MAnsel Bong  We ask that she return in 1-2 months for further evaluation.  All questions were answered. The patient knows to call the clinic with any problems, questions or concerns. No barriers to learning were detected.  The total time spent in the encounter was 25 minutes and more than 50% was on counseling and review of test results   ZVentura Sellers MD Medical Director of Neuro-Oncology [facility] [place] W[place]toine10/09/19 9:59 AM

## 2017-11-10 NOTE — Telephone Encounter (Signed)
Patient called requesting echo results.

## 2017-11-11 ENCOUNTER — Telehealth: Payer: Self-pay

## 2017-11-11 NOTE — Telephone Encounter (Signed)
Looks like we did not order this echo - was ordered by her oncologist Cira Rue, NP).  Attempted to reach patient - no answer on home or mobile number.

## 2017-11-11 NOTE — Telephone Encounter (Signed)
Spoke with patient concerning her upcoming appointments. Per 10/9 los Mailed a letter with a calender enclosed.

## 2017-11-11 NOTE — Telephone Encounter (Signed)
Patient returning call - explained to her that typically final results will go back to the ordering provider.  Suggested that she call her for the final results.  Patient verbalized understanding & stated that she will be seeing her next week anyway.

## 2017-11-15 ENCOUNTER — Other Ambulatory Visit: Payer: Self-pay | Admitting: Internal Medicine

## 2017-11-15 DIAGNOSIS — T451X5A Adverse effect of antineoplastic and immunosuppressive drugs, initial encounter: Principal | ICD-10-CM

## 2017-11-15 DIAGNOSIS — G620 Drug-induced polyneuropathy: Secondary | ICD-10-CM

## 2017-11-16 ENCOUNTER — Other Ambulatory Visit: Payer: Self-pay | Admitting: *Deleted

## 2017-11-16 ENCOUNTER — Encounter: Payer: Self-pay | Admitting: Internal Medicine

## 2017-11-16 DIAGNOSIS — T451X5A Adverse effect of antineoplastic and immunosuppressive drugs, initial encounter: Principal | ICD-10-CM

## 2017-11-16 DIAGNOSIS — G620 Drug-induced polyneuropathy: Secondary | ICD-10-CM

## 2017-11-16 NOTE — Progress Notes (Signed)
Order corrected based on needing in home physical therapy

## 2017-11-17 DIAGNOSIS — J45909 Unspecified asthma, uncomplicated: Secondary | ICD-10-CM | POA: Diagnosis not present

## 2017-11-17 DIAGNOSIS — I2510 Atherosclerotic heart disease of native coronary artery without angina pectoris: Secondary | ICD-10-CM | POA: Diagnosis not present

## 2017-11-17 DIAGNOSIS — I10 Essential (primary) hypertension: Secondary | ICD-10-CM | POA: Diagnosis not present

## 2017-11-17 DIAGNOSIS — G620 Drug-induced polyneuropathy: Secondary | ICD-10-CM | POA: Diagnosis not present

## 2017-11-17 DIAGNOSIS — T451X5D Adverse effect of antineoplastic and immunosuppressive drugs, subsequent encounter: Secondary | ICD-10-CM | POA: Diagnosis not present

## 2017-11-17 DIAGNOSIS — C50212 Malignant neoplasm of upper-inner quadrant of left female breast: Secondary | ICD-10-CM | POA: Diagnosis not present

## 2017-11-17 DIAGNOSIS — Z87891 Personal history of nicotine dependence: Secondary | ICD-10-CM | POA: Diagnosis not present

## 2017-11-17 DIAGNOSIS — Z7984 Long term (current) use of oral hypoglycemic drugs: Secondary | ICD-10-CM | POA: Diagnosis not present

## 2017-11-17 DIAGNOSIS — E119 Type 2 diabetes mellitus without complications: Secondary | ICD-10-CM | POA: Diagnosis not present

## 2017-11-17 DIAGNOSIS — Z7982 Long term (current) use of aspirin: Secondary | ICD-10-CM | POA: Diagnosis not present

## 2017-11-17 DIAGNOSIS — Z9012 Acquired absence of left breast and nipple: Secondary | ICD-10-CM | POA: Diagnosis not present

## 2017-11-22 NOTE — Progress Notes (Signed)
[place]  [phone] [phone]  Clinic Follow up Note   Patient Care Team: Sasser, Silvestre Moment, MD as PCP - General (Cardiology) [name], MD as PCP - Cardiology (Cardiology) Truitt Merle, MD as Consulting Physician (Hematology) Excell Seltzer, MD as Consulting Physician (General Surgery) Alla Feeling, NP as Nurse Practitioner (Nurse Practitioner) [name], MD as Attending Physician (Cardiology)   Date of Service:  11/24/2017  SUMMARY OF ONCOLOGIC HISTORY: Cancer Staging Carcinoma of upper-inner quadrant of left breast in female, estrogen receptor positive ([place]) Staging form: Breast, AJCC 8th Edition - Clinical stage from 01/18/2017: Stage IB (cT2, cN0, cM0, G3, ER: Positive, PR: Positive, HER2: Positive) - Signed by Truitt Merle, MD on 02/10/2017 - Pathologic stage from 02/23/2017: Stage IB (pT3, pN0, cM0, G3, ER: Positive, PR: Positive, HER2: Positive) - Signed by Alla Feeling, NP on 03/16/2017  Carcinoma of upper-outer quadrant of left breast in female, estrogen receptor positive ([place]) Staging form: Breast, AJCC 8th Edition - Clinical stage from 01/18/2017: Stage IB (cT2, cN0, cM0, G2, ER: Positive, PR: Positive, HER2: Negative) - Unsigned - Pathologic stage from 02/23/2017: Stage IB (pT2, pN0(sn), cM0, G3, ER: Positive, PR: Positive, HER2: Negative) - Signed by Alla Feeling, NP on 03/16/2017  Oncology History   Cancer Staging Carcinoma of upper-inner quadrant of left breast in female, estrogen receptor positive ([place]) Staging form: Breast, AJCC 8th Edition - Clinical stage from 01/18/2017: Stage IB (cT2, cN0, cM0, G3, ER: Positive, PR: Positive, HER2: Positive) - Signed by Truitt Merle, MD on 02/10/2017 - Pathologic stage from 02/23/2017: Stage IB (pT3, pN0, cM0, G3, ER: Positive, PR: Positive, HER2: Positive) - Signed by Alla Feeling, NP on 03/16/2017  Carcinoma of upper-outer quadrant of left breast in female, estrogen  receptor positive ([place]) Staging form: Breast, AJCC 8th Edition - Clinical stage from 01/18/2017: Stage IB (cT2, cN0, cM0, G2, ER: Positive, PR: Positive, HER2: Negative) - Unsigned - Pathologic stage from 02/23/2017: Stage IB (pT2, pN0(sn), cM0, G3, ER: Positive, PR: Positive, HER2: Negative) - Signed by Alla Feeling, NP on 03/16/2017       Carcinoma of upper-outer quadrant of left breast in female, estrogen receptor positive ([place])   01/13/2017 Mammogram    FINDINGS: In the left breast, a spiculated mass lies in the upper outer quadrant. In the medial posterior left breast, there is a larger lobulated mass. These masses correspond to the palpable abnormalities. No other discrete left breast masses.  In the right breast, there are no discrete masses. There are no areas of architectural distortion.  In both breasts there are scattered calcifications, rounded punctate, with a few other larger coarse dystrophic calcifications, without significant change from the previous screening mammogram, which is dated 12/04/2009.  Mammographic images were processed with CAD.  On physical exam, there is a firm palpable mass in the upper outer left breast and another firm mass in the medial left breast.     01/13/2017 Breast US    Targeted ultrasound is performed, showing an irregular hypoechoic shadowing mass in the left breast at the 2:30 o'clock position, 5 cm the nipple, measuring 3.3 x 2.8 x 2.6 cm. In the 10:30 o'clock position of the left breast, 5 cm the nipple, there is irregular hypoechoic mass with somewhat more lobulated margins, corresponding to the lobulated mass seen mammographically. This measures 4.8 x 2.9 x 4.8 cm. Both masses show internal vascularity on color Doppler analysis.  Sonographic evaluation of the left axilla shows  several nodes with thickened cortices. Status cortex measured is 5 mm. None of these lymph nodes appear enlarged but overall size  criteria.  IMPRESSION: 1. Two masses in the left breast, 1 at the 2:30 o'clock position in the other at the 10:30 o'clock position, both highly suspicious for breast carcinoma. 2. Several abnormal left axillary lymph nodes with thickened cortices. 3. No evidence of right breast malignancy.     01/18/2017 Pathology Results    Diagnosis 1. Breast, left, needle core biopsy, 2:30 o'clock - INVASIVE MAMMARY CARCINOMA - MAMMARY CARCINOMA IN-SITU - SEE COMMENT 2. Lymph node, needle/core biopsy, left axilla - NO CARCINOMA IDENTIFIED - SEE COMMENT 3. Breast, left, needle core biopsy, 10:30 o'clock - INVASIVE DUCTAL CARCINOMA - SEE COMMENT  1. PROGNOSTIC INDICATORS Results: IMMUNOHISTOCHEMICAL AND MORPHOMETRIC ANALYSIS PERFORMED MANUALLY Estrogen Receptor: 100%, POSITIVE, STRONG STAINING INTENSITY Progesterone Receptor: 60%, POSITIVE, STRONG STAINING INTENSITY Proliferation Marker Ki67: 60%  1. FLUORESCENCE IN-SITU HYBRIDIZATION Results: HER2 - NEGATIVE RATIO OF HER2/CEP17 SIGNALS 1.28 AVERAGE HER2 COPY NUMBER PER CELL 2.75  3. PROGNOSTIC INDICATORS Results: IMMUNOHISTOCHEMICAL AND MORPHOMETRIC ANALYSIS PERFORMED MANUALLY Estrogen Receptor: 100%, POSITIVE, STRONG STAINING INTENSITY Progesterone Receptor: 50%, POSITIVE, STRONG STAINING INTENSITY Proliferation Marker Ki67: 70%  3. FLUORESCENCE IN-SITU HYBRIDIZATION Results: HER2 - **POSITIVE** RATIO OF HER2/CEP17 SIGNALS 2.80 AVERAGE HER2 COPY NUMBER PER CELL 6.15  Microscopic Comment 1. The biopsy material shows an infiltrative proliferation of cells with large vesicular nuclei with inconspicuous nucleoli, arranged linearly and in small clusters. Based on the biopsy, the carcinoma appears Nottingham grade 2 of 3 and measures 0.9 cm in greatest linear extent. E-cadherin and prognostic markers (ER/PR/ki-67/HER2-FISH)are pending and will be reported in an addendum. Dr. Lyndon Code reviewed the case and agrees with the above  diagnosis.    02/10/2017 Initial Diagnosis    Carcinoma of upper-outer quadrant of left breast in female, estrogen receptor positive ([name])    02/19/2017 Imaging    Bone Scan whole Body 02/19/17 IMPRESSION: Negative for evidence of bony metastatic disease.    02/19/2017 Imaging    CT CAP W Contrast 02/19/17 IMPRESSION: 1. Small right middle lobe pulmonary nodules up to 0.8 cm, nonspecific. Non-contrast chest CT at 3-6 months is recommended. If the nodules are stable at time of repeat CT, then future CT at 18-24 months (from today's scan) is considered optional for low-risk patients, but is recommended for high-risk patients. This recommendation follows the consensus statement: Guidelines for Management of Incidental Pulmonary Nodules Detected on CT Images: From the Fleischner Society 2017; Radiology 2017; 284:228-243. 2. Subcentimeter hepatic lesions likely cysts but incompletely characterized due to small size. Attention on follow-up imaging recommended. 3. 2.1 cm fusiform right common iliac artery aneurysm. Continued surveillance recommended. 4. Aortic Atherosclerosis (ICD10-I70.0) and Emphysema (ICD10-J43.9).    02/23/2017 Pathology Results    Diagnosis 1. Breast, simple mastectomy, Left Total - INVASIVE DUCTAL CARCINOMA, MULTIFOCAL, NOTTINGHAM GRADE 3/3 (5.3 CM, 3.5 CM) - INVASIVE CARCINOMA INVOLVES THE DERMIS - DUCTAL CARCINOMA IN SITU, INTERMEDIATE GRADE - HYALINIZED FIBROADENOMA - MARGINS UNINVOLVED BY CARCINOMA - NO CARCINOMA IDENTIFIED IN TWO LYMPH NODES (0/2) - SEE ONCOLOGY TABLE AND COMMENT BELOW 2. Lymph node, sentinel, biopsy, Left Axillary - NO CARCINOMA IDENTIFIED IN ONE LYMPH NODE (0/1) 3. Lymph node, sentinel, biopsy - NO CARCINOMA IDENTIFIED IN ONE LYMPH NODE (0/1) 4. Lymph node, sentinel, biopsy - NO CARCINOMA IDENTIFIED IN ONE LYMPH NODE (0/1) 5. Lymph node, sentinel, biopsy - NO CARCINOMA IDENTIFIED IN ONE LYMPH NODE (0/1) Microscopic Comment 1. BREAST,  INVASIVE TUMOR Procedure:  Simple mastectomy Laterality: Left Tumor Size: 5. 3 cm, 3.5 cm Histologic Type: Invasive carcinoma of no special type (ductal, not otherwise specified) Grade: Nottingham Grade 3 Tubular Differentiation: 3 Nuclear Pleomorphism: 3 Mitotic Count: 2 Ductal Carcinoma in Situ (DCIS): Present Extent of Tumor: Skin: Invasive carcinoma directly invades into the dermis or epidermis without skin ulceration Margins: Invasive carcinoma, distance from closest margin: 1.5 cm (posterior) DCIS, distance from closest margin: > 1 cm Regional Lymph Nodes: Number of Lymph Nodes Examined: 6 Number of Sentinel Lymph Nodes Examined: 4 Lymph Nodes with Macrometastases: 0 Lymph Nodes with Micrometastases: 0 Lymph Nodes with Isolated Tumor Cells: 0 Treatment effect: No known presurgical therapy Breast Prognostic Profile: See Also ([id number]) Estrogen Receptor: Positive (100%, strong); Positive (100%, strong) Progesterone Receptor: Positive (50%, strong); Positive (50%, strong) Her2: Positive (Ratio 2.80); Negative (Ratio 1.28) Ki-67: 70%; 60% Best tumor block for sendout testing: 1E Pathologic Stage Classification (pTNM, AJCC 8th Edition): Primary Tumor: mpT3 Regional Lymph Nodes: pN0 COMMENT: The two invasive carcinomas in the breast have slightly different morphologies. The larger lesion has a papillary and micropapillary pattern admixed with typical ductal carcinoma while the smaller lesion is more typical of a ductal lesion with linear arrays (E-cadherin performed on biopsy). There are foci within the larger lesion that are concerning for lymphovascular space invasion.     04/07/2017 -  Chemotherapy    weekly taxol and herceptin x12 weeks, 04/07/17-06/23/17. Then herceptin q3 weeks for total 1 year which was switched to [place] on 07/14/17.    07/20/2017 - 09/03/2017 Radiation Therapy    She started adjuvant radiation on 07/20/17 with Dr. Lisbeth Renshaw and plans to complete on 09/03/17.      08/24/2017 -  Chemotherapy    maintenance Herceptin    HISTORY OF PRESENTING ILLNESS:  Penny Huynh 84 y.o. female is here because of the diagnosed left breast cancer, she presents with her husband.  The patient self palpated a left breast mass around 6 months ago, she thought it was calcium deposit as her mother had a history of this.  There was no pain at first but has become intermittently tender which prompted her to seek medical attention with her PCP.  She reports the palpable mass nearly doubled in that time period. No associated breast swelling or nipple discharge.  She reports a decreased appetite lately, denies fatigue or weight loss.  Diagnostic imaging shows an irregular hypoechoic mass in the left breast at the 2:30 o'clock position, 5 cm from the nipple, measuring 3.3 x 2.8 x 2.6 cm.  In the 10:30 o'clock position of the left breast, 5 cm from the nipple, there is irregular hypoechoic mass measuring 4.8 x 2.9 x 4.8 cm.  Ultrasound of the left axilla shows cortical thickening but no enlarged lymph nodes.  Left breast mass at 2:30 o'clock position is positive for invasive mammary carcinoma with mammary carcinoma in situ, ER/PR positive, HER-2 negative; left breast mass at 10:30 o'clock position is positive for invasive ductal carcinoma, ER/PR positive, HER-2 positive.  The biopsied lymph node was negative for carcinoma.  She was then referred to [name] for surgical consult.  Today she reports feeling well with intermittent productive nagging cough with clear phlegm; no wheezing or dyspnea.  Has occasional burning in hands and feet but not affecting function, she attributes to long-term diabetes.  Denies breast or general pain, as well.  Denies palpitations, chest pain, or lower extremity edema.  In the past she has history of knee pain, hypertension,  hyperlipidemia, CAD, and DM.  In the 1960s she was told she had an enlarged heart but was never told she had a diagnosis of heart  failure, no MI or stroke.  She is followed by cardiologist Dr. Bronson Ing. Last echo in 2013 with EF 60-65% and grade I diastolic dysfunction.   She is retired from [employer] but has been recently enrolled in college courses to further her education. She previously exercised 6 days per week but decreased exercise over the last 6 months. She also works at [employer] during tax season. Lives at home with her spouse. She is independent of all ADLs, drives. She drinks alcohol occasionally; former smoker who quit in 2001, previously smoked 2 PPD x46 years.   GYN HISTORY  Menarchal: age [age] LMP: age 5 Contraceptive: none HRT: none GP: G2P0, has 2 grown (foster) children  CURRENT THERAPY:   -weekly taxol and herceptin x12 weeks, 04/07/17-06/23/17. Then herceptin q3 weeks for total 1 year which was switched to [place] on 07/14/17, and switched back to maintenance Herceptin on September 02, 2017 due to worsening peripheral neuropathy.   INTERVAL HISTORY:  SIMRIN VEGH returns for follow up. She was last seen by me 2 months ago. She has been following up with PT, Dr Mickeal Skinner, and NP Regan Rakers in the interim. Today, she is here with her family member. She states that her neuropathy is not improving and she started Lyrica. She is doing well with home PT. She states that she has one PT session, and will have 3 more. She would like to have more. She denies recent falls and states that she is sleeping well. She is starting to bathe on her own, but sometimes has trouble getting in and out of the tub.   She is tolerating Anastrozole well with moderate hot flashes and mild mood swings. She denies joint pain. She noticed weight gain and relates that to diet.   REVIEW OF SYSTEMS:  Constitutional: Denies fatigue, fevers, chills or abnormal weight loss (+) weight gain (+) uses cane Eyes: Denies blurriness of vision Ears, nose, mouth, throat, and face: Denies mucositis or sore throat  Respiratory:  Denies cough, dyspnea or wheezes Cardiovascular: Denies palpitation, chest discomfort, no new or worsening LL edema Gastrointestinal:  Denies nausea, vomiting, diarrhea, heartburn  Skin: Denies rashes or lesions Lymphatics: Denies new lymphadenopathy or easy bruising MSK: (+) left knee pain from worsening neuropathy  Neurological: Denies new weaknesses  (+) peripheral neuropathy in her hands and feet, worsening up her leg Behavioral/Psych: Mood is stable, no new changes  All other systems were reviewed with the patient and are negative.  MEDICAL HISTORY:  Past Medical History:  Diagnosis Date  . Arthritis    "maybe a little in my left knee" (02/23/2017)  . Asthmatic bronchitis   . Cancer of left breast ([place])   . Coronary atherosclerosis    Minor nonobstructive CAD at cardiac catheterization 2009  . Dyslipidemia   . Femoral bruit 7/08   With normal ABIs  . Hypertension   . Lower extremity edema    Chronic  . Pneumonia ~ 1950   in 8th grade  . Type II diabetes mellitus ([place])     SURGICAL HISTORY: Past Surgical History:  Procedure Laterality Date  . APPENDECTOMY    . BREAST BIOPSY Left 02/2017  . CARDIAC CATHETERIZATION     "long time ago" (02/23/2017)  . MASTECTOMY COMPLETE / SIMPLE W/ SENTINEL NODE BIOPSY Left 02/23/2017   TOTAL MATECTOMY;  LEFT  AXILLARY SENTINEL LYMPH NODE BIOPSY ERAS PATHWAY  . MASTECTOMY WITH RADIOACTIVE SEED GUIDED EXCISION AND AXILLARY SENTINEL LYMPH NODE BIOPSY Left 02/23/2017   Procedure: LEFT TOTAL MATECTOMY;  LEFT AXILLARY SENTINEL LYMPH NODE BIOPSY ERAS PATHWAY;  Surgeon: Excell Seltzer, MD;  Location: [facility];  Service: General;  Laterality: Left;  . PILONIDAL CYST EXCISION    . PORTA CATH INSERTION  02/23/2017  . PORTACATH PLACEMENT N/A 02/23/2017   Procedure: INSERTION PORT-A-CATH;  Surgeon: Excell Seltzer, MD;  Location: [facility];  Service: General;  Laterality: N/A;  . SHOULDER ARTHROSCOPY W/ ROTATOR CUFF REPAIR Right     I have reviewed  the social history and family history with the patient and they are unchanged from previous note.  ALLERGIES:  has No Known Allergies.  MEDICATIONS:  Current Outpatient Medications  Medication Sig Dispense Refill  . amitriptyline (ELAVIL) 50 MG tablet Take 1 tablet (50 mg total) by mouth at bedtime. 30 tablet 3  . amLODipine (NORVASC) 5 MG tablet Take 1 tablet (5 mg total) by mouth daily. 30 tablet 2  . anastrozole (ARIMIDEX) 1 MG tablet Take 1 tablet (1 mg total) by mouth daily. 30 tablet 1  . aspirin 81 MG EC tablet Take 81 mg by mouth at bedtime.     . chlorthalidone (HYGROTON) 25 MG tablet Take 25 mg by mouth daily.     . Coenzyme Q10 (COQ10) 100 MG CAPS Take 100 mg by mouth daily.      Marland Kitchen HYDROcodone-acetaminophen (NORCO/VICODIN) 5-325 MG tablet Take 1 tablet by mouth every 6 (six) hours as needed for moderate pain. 40 tablet 0  . latanoprost (XALATAN) 0.005 % ophthalmic solution Place 1 drop into both eyes at bedtime.    . lidocaine-prilocaine (EMLA) cream APPLY CREAM TOPICALLY TO AFFECTED AREA ONCE  3  . losartan (COZAAR) 50 MG tablet Take 50 mg by mouth daily.    . metFORMIN (GLUCOPHAGE) 500 MG tablet Take 1,000 mg by mouth daily with breakfast.     . pregabalin (LYRICA) 75 MG capsule Take 2 capsules (150 mg total) by mouth 2 (two) times daily. For first week of treatment, take '75mg'$  two times daily 120 capsule 1  . vitamin B-12 (CYANOCOBALAMIN) 1000 MCG tablet Take 1,000 mcg by mouth daily.     No current facility-administered medications for this visit.     PHYSICAL EXAMINATION:  ECOG PERFORMANCE STATUS: 3  Vitals:   11/24/17 0943  BP: (!) 156/76  Pulse: 84  Resp: 17  Temp: (!) 97.4 F (36.3 C)  SpO2: 100%   Filed Weights   11/24/17 0943  Weight: 165 lb 9.6 oz (75.1 kg)    GENERAL:alert, no distress and comfortable, (+) uses cane SKIN: skin color, texture, turgor are normal  (+) mild bumps over left chest from radiation EYES: normal, Conjunctiva are pink and  non-injected, sclera clear OROPHARYNX:no exudate, no erythema and lips, buccal mucosa, and tongue normal  NECK: supple, thyroid normal size, non-tender, without nodularity LYMPH:  no palpable cervical, supraclavicular, or axillary lymphadenopathy  LUNGS: clear to auscultation bilaterally with normal breathing effort HEART: regular rate & rhythm and no murmurs (+) B/l lower extremity edema of feet ABDOMEN:abdomen soft, non-tender and normal bowel sounds Musculoskeletal:no cyanosis of digits and no clubbing  NEURO: alert & oriented x 3 with fluent speech, no focal motor/sensory deficits (+) right LL tender to touch  BREASTS: s/p left mastectomy, surgical incision healing well PAC without erythema (+) mild numbness at surgical site   LABORATORY DATA:  I have reviewed the data as listed CBC Latest Ref Rng & Units 11/24/2017 11/03/2017 10/13/2017  WBC 4.0 - 10.5 K/uL 4.3 4.2 3.4(L)  Hemoglobin 12.0 - 15.0 g/dL 10.9(L) 10.9(L) 10.0(L)  Hematocrit 36.0 - 46.0 % 34.5(L) 33.9(L) 30.7(L)  Platelets 150 - 400 K/uL 239 211 303     CMP Latest Ref Rng & Units 11/03/2017 10/13/2017 09/23/2017  Glucose 70 - 99 mg/dL 158(H) 103(H) 103(H)  BUN 8 - 23 mg/dL '9 10 10  '$ Creatinine 0.44 - 1.00 mg/dL 0.75 0.73 0.73  Sodium 135 - 145 mmol/L 131(L) 133(L) 130(L)  Potassium 3.5 - 5.1 mmol/L 3.7 3.9 3.8  Chloride 98 - 111 mmol/L 95(L) 97(L) 94(L)  CO2 22 - 32 mmol/L '30 26 28  '$ Calcium 8.9 - 10.3 mg/dL 9.5 9.4 9.7  Total Protein 6.5 - 8.1 g/dL 6.7 6.7 7.1  Total Bilirubin 0.3 - 1.2 mg/dL 0.7 0.5 0.5  Alkaline Phos 38 - 126 U/L 65 59 63  AST 15 - 41 U/L '22 26 24  '$ ALT 0 - 44 U/L '17 20 22    '$ PATHOLOGY  LEFT SIMPLE MASTECTOMY WITH SNLB 02/23/17 per [name] Diagnosis 1. Breast, simple mastectomy, Left Total - INVASIVE DUCTAL CARCINOMA, MULTIFOCAL, NOTTINGHAM GRADE 3/3 (5.3 CM, 3.5 CM) - INVASIVE CARCINOMA INVOLVES THE DERMIS - DUCTAL CARCINOMA IN SITU, INTERMEDIATE GRADE - HYALINIZED FIBROADENOMA - MARGINS  UNINVOLVED BY CARCINOMA - NO CARCINOMA IDENTIFIED IN TWO LYMPH NODES (0/2) - SEE ONCOLOGY TABLE AND COMMENT BELOW 2. Lymph node, sentinel, biopsy, Left Axillary - NO CARCINOMA IDENTIFIED IN ONE LYMPH NODE (0/1) 3. Lymph node, sentinel, biopsy - NO CARCINOMA IDENTIFIED IN ONE LYMPH NODE (0/1) 4. Lymph node, sentinel, biopsy - NO CARCINOMA IDENTIFIED IN ONE LYMPH NODE (0/1) 5. Lymph node, sentinel, biopsy - NO CARCINOMA IDENTIFIED IN ONE LYMPH NODE (0/1) Microscopic Comment 1. BREAST, INVASIVE TUMOR Procedure: Simple mastectomy Laterality: Left Tumor Size: 5. 3 cm, 3.5 cm Histologic Type: Invasive carcinoma of no special type (ductal, not otherwise specified) Grade: Nottingham Grade 3 Tubular Differentiation: 3 Nuclear Pleomorphism: 3 Mitotic Count: 2 Ductal Carcinoma in Situ (DCIS): Present Extent of Tumor: Skin: Invasive carcinoma directly invades into the dermis or epidermis without skin ulceration Margins: Invasive carcinoma, distance from closest margin: 1.5 cm (posterior) DCIS, distance from closest margin: > 1 cm Regional Lymph Nodes: Number of Lymph Nodes Examined: 6 Number of Sentinel Lymph Nodes Examined: 4 Lymph Nodes with Macrometastases: 0 Lymph Nodes with Micrometastases: 0 Lymph Nodes with Isolated Tumor Cells: 0 Treatment effect: No known presurgical therapy Breast Prognostic Profile: See Also ([id number]) Estrogen Receptor: Positive (100%, strong); Positive (100%, strong) Progesterone Receptor: Positive (50%, strong); Positive (50%, strong) Her2: Positive (Ratio 2.80); Negative (Ratio 1.28) Ki-67: 70%; 60% Best tumor block for sendout testing: 1E Pathologic Stage Classification (pTNM, AJCC 8th Edition): Primary Tumor: mpT3 Regional Lymph Nodes: pN0 COMMENT: The two invasive carcinomas in the breast have slightly different morphologies. The larger lesion has a papillary and micropapillary pattern admixed with typical ductal carcinoma while the smaller  lesion is more typical of a ductal lesion with linear arrays (E-cadherin performed on biopsy). There are foci within the larger lesion that are concerning for lymphovascular space invasion.   02/17/17 ECHO: Study Conclusions - Left ventricle: The cavity size was normal. Wall thickness was   increased in a pattern of mild LVH. Systolic function was normal.   The estimated ejection fraction was in the range of 55% to 60%.   Normal GLS at -20%. Wall motion  was normal; there were no   regional wall motion abnormalities. Doppler parameters are   consistent with abnormal left ventricular relaxation (grade 1   diastolic dysfunction). The E/e&' ratio is between 8-15,   suggesting indeterminate LV filling pressure. - Aortic valve: Sclerosis without stenosis. - Aorta: Mildly dilated aorta. Aortic root dimension: 39 mm (ED).   Ascending aortic diameter: 38 mm (S). - Left atrium: The atrium was normal in size. - Inferior vena cava: The vessel was normal in size. The   respirophasic diameter changes were in the normal range (>= 50%),   consistent with normal central venous pressure. Impressions: - Compared to a prior study in 2013, the LVEF is slightly lower but   normal at 55-60%. There is mild LVH, normal GLS at -20%, grade 1   DD and indeterminate LV filling pressure. The aorta is mildly   dilated at 3.8-3.9 cm.   RADIOGRAPHIC STUDIES: I have personally reviewed the radiological images as listed and agreed with the findings in the report.   Bone Scan whole Body 02/19/17 IMPRESSION: Negative for evidence of bony metastatic disease.   CT CAP W Contrast 02/19/17 IMPRESSION: 1. Small right middle lobe pulmonary nodules up to 0.8 cm, nonspecific. Non-contrast chest CT at 3-6 months is recommended. If the nodules are stable at time of repeat CT, then future CT at 18-24 months (from today's scan) is considered optional for low-risk patients, but is recommended for high-risk patients.  This recommendation follows the consensus statement: Guidelines for Management of Incidental Pulmonary Nodules Detected on CT Images: From the Fleischner Society 2017; Radiology 2017; 284:228-243. 2. Subcentimeter hepatic lesions likely cysts but incompletely characterized due to small size. Attention on follow-up imaging recommended. 3. 2.1 cm fusiform right common iliac artery aneurysm. Continued surveillance recommended. 4. Aortic Atherosclerosis (ICD10-I70.0) and Emphysema (ICD10-J43.9).   ASSESSMENT & PLAN:  Yanni Ruberg is an 84 y.o. female with past medical history positive for HTN, DM, CAD now with newly diagnosed left breast cancer  1.  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   -We previously reviewed her pathology results in detail, she had multifocal invasive ductal carcinoma, G3, the upper inner quadrant focus spanning 5.3 cm, ER+/PR+/HER2+; the upper outer quadrant focus spanned 3.5 cm, ER+/PR+/HER2- node-negative.  -We previously discussed the high risk of recurrence due to HER-2 positive T3 disease, I previously recommended her to consider adjuvant chemotherapy. Due to her advanced age, I recommended weekly Taxol and Herceptin for 12 weeks, followed by Herceptin maintenance therapy for a total of 1 year. She began this regimen on 04/07/17. She tolerated treatment well overall. She has completed Taxol therapy on 06/23/17.  -based on recent adjuvant T-DM1 data, I previously recommended her to switch Herceptin maintenance to Kadcyla maintenance, to complete 1 year therapy.  She agreed and switched on 07/14/17.   Unfortunately she developed worsening peripheral neuropathy, which could be partially related to T-DM1, so I switched back to Herceptin on 09/02/2017 -She started adjuvant radiation on 07/20/17 with Dr. Lisbeth Renshaw and completed on 09/03/17, tolerated well  -Her peripheral neuropathy has improved recently, she is able to ambulate with a cane. -I recommend her to  start adjuvant anastrazole.  -The potential benefit and side effects, which includes but not limited to, hot flash, skin and vaginal dryness, metabolic changes ( increased blood glucose, cholesterol, weight, etc.), slightly in increased risk of cardiovascular disease, cataracts, muscular and joint discomfort, osteopenia and osteoporosis, etc, were discussed with her in great details. She agreed.  -She states that her neuropathy is not improving and she is having trouble performing some daily activities. She is doing well with PT at home and would like to have more sessions. -She is tolerating Anastrozole well with moderate hot flashes and mild mood swings. Denies joint pain. I educated her about weight gain and increase in BG with Anastrozole, and advised her to monitor.  -She is clinically stable overall, lab reviewed, adequate for treatment, will proceed maintenance Herceptin today and continue every 3 weeks, until March 2020, repeat echo in Jan 2020  -Follow-up in 6 weeks  2. HTN, CAD, DM -She is on amlodipine, chlorthalidone, and losartan and metformin BID for DM -We previously reviewed DM can worsen while on chemotherapy especially with steroid administration; will monitor closely.  -She will continue to monitor BG at home and see PCP for any needed medication adjustments. DM is well controlled now.  -Her 07/08/17 ECHO with EF of 55% -Continue follow-up of his primary care physician, and cardiologist   3. Hyponatremia, Hypokalemia  -She is not on SSRI.   -On chlorthalidone diuretic for HTN.  -Encouraged to monitor her BP. She is asymptomatic;  will monitor closely.  -Hyperkalemia has resolved, her sodium is still low overall stable.  4. 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 -I previously iced her hands and feet during chemo and taxol dose was reduced accordingly and has completed.  -She started Gabapentin 100 mg in 08/2017. -Her neuropathy has worsened, especially at night and has progressed up her legs.  -She has been on B12 supplement, I recommend she to switch to B complex.  -She ambulates with cane and walker and has had to stop PT for now.  -I filled her Handicap sticker previously. -We recently referred her to see neurologist Dr. Mickeal Skinner, she is currently on high-dose gabapentin and amitriptyline. Her neuropathy is improving.  -I advised her to attend PT for her neuropathy, referral was made previously   PLAN: -Continue Anastrozole -continue herceptin every 3 weeks, treatment today  -F/u in 6 weeks with labs and flush -Continue herceptin today     All questions were answered. The patient knows to call the clinic with any problems, questions or concerns. No barriers to learning was detected.  I spent 25 minutes counseling the patient face to face. The total time spent in the appointment was 30 minutes and more than 50% was on counseling and review of test results  I, Noor Dweik am acting as scribe for Dr. Truitt Merle.  I have reviewed the above documentation for accuracy and completeness, and I agree with the above.   Truitt Merle, MD 11/24/2017

## 2017-11-23 DIAGNOSIS — G620 Drug-induced polyneuropathy: Secondary | ICD-10-CM | POA: Diagnosis not present

## 2017-11-23 DIAGNOSIS — I2510 Atherosclerotic heart disease of native coronary artery without angina pectoris: Secondary | ICD-10-CM | POA: Diagnosis not present

## 2017-11-23 DIAGNOSIS — T451X5D Adverse effect of antineoplastic and immunosuppressive drugs, subsequent encounter: Secondary | ICD-10-CM | POA: Diagnosis not present

## 2017-11-23 DIAGNOSIS — C50212 Malignant neoplasm of upper-inner quadrant of left female breast: Secondary | ICD-10-CM | POA: Diagnosis not present

## 2017-11-23 DIAGNOSIS — E119 Type 2 diabetes mellitus without complications: Secondary | ICD-10-CM | POA: Diagnosis not present

## 2017-11-23 DIAGNOSIS — I10 Essential (primary) hypertension: Secondary | ICD-10-CM | POA: Diagnosis not present

## 2017-11-24 ENCOUNTER — Inpatient Hospital Stay: Payer: Medicare Other

## 2017-11-24 ENCOUNTER — Inpatient Hospital Stay (HOSPITAL_BASED_OUTPATIENT_CLINIC_OR_DEPARTMENT_OTHER): Payer: Medicare Other | Admitting: Hematology

## 2017-11-24 ENCOUNTER — Encounter: Payer: Self-pay | Admitting: Hematology

## 2017-11-24 ENCOUNTER — Telehealth: Payer: Self-pay

## 2017-11-24 VITALS — BP 156/76 | HR 84 | Temp 97.4°F | Resp 17 | Ht 64.0 in | Wt 165.6 lb

## 2017-11-24 DIAGNOSIS — Z5111 Encounter for antineoplastic chemotherapy: Secondary | ICD-10-CM | POA: Diagnosis not present

## 2017-11-24 DIAGNOSIS — C50212 Malignant neoplasm of upper-inner quadrant of left female breast: Secondary | ICD-10-CM

## 2017-11-24 DIAGNOSIS — Z170 Estrogen receptor positive status [ER+]: Secondary | ICD-10-CM | POA: Diagnosis not present

## 2017-11-24 DIAGNOSIS — E871 Hypo-osmolality and hyponatremia: Secondary | ICD-10-CM | POA: Diagnosis not present

## 2017-11-24 DIAGNOSIS — Z95828 Presence of other vascular implants and grafts: Secondary | ICD-10-CM

## 2017-11-24 DIAGNOSIS — Z79899 Other long term (current) drug therapy: Secondary | ICD-10-CM | POA: Diagnosis not present

## 2017-11-24 DIAGNOSIS — C50412 Malignant neoplasm of upper-outer quadrant of left female breast: Secondary | ICD-10-CM

## 2017-11-24 DIAGNOSIS — E876 Hypokalemia: Secondary | ICD-10-CM | POA: Diagnosis not present

## 2017-11-24 DIAGNOSIS — N951 Menopausal and female climacteric states: Secondary | ICD-10-CM | POA: Diagnosis not present

## 2017-11-24 LAB — CMP (CANCER CENTER ONLY)
ALBUMIN: 3.5 g/dL (ref 3.5–5.0)
ALK PHOS: 69 U/L (ref 38–126)
ALT: 14 U/L (ref 0–44)
ANION GAP: 10 (ref 5–15)
AST: 21 U/L (ref 15–41)
BUN: 8 mg/dL (ref 8–23)
CO2: 26 mmol/L (ref 22–32)
Calcium: 9.6 mg/dL (ref 8.9–10.3)
Chloride: 97 mmol/L — ABNORMAL LOW (ref 98–111)
Creatinine: 0.74 mg/dL (ref 0.44–1.00)
GFR, Estimated: >60 mL/min (ref >60)
GLUCOSE: 136 mg/dL — AB (ref 70–99)
POTASSIUM: 3.9 mmol/L (ref 3.5–5.1)
SODIUM: 133 mmol/L — AB (ref 135–145)
Total Bilirubin: 0.6 mg/dL (ref 0.3–1.2)
Total Protein: 6.8 g/dL (ref 6.5–8.1)

## 2017-11-24 LAB — CBC WITH DIFFERENTIAL (CANCER CENTER ONLY)
Abs Immature Granulocytes: 0.01 10*3/uL (ref 0.00–0.07)
BASOS ABS: 0 10*3/uL (ref 0.0–0.1)
BASOS PCT: 1 %
EOS PCT: 3 %
Eosinophils Absolute: 0.1 10*3/uL (ref 0.0–0.5)
HCT: 34.5 % — ABNORMAL LOW (ref 36.0–46.0)
HEMOGLOBIN: 10.9 g/dL — AB (ref 12.0–15.0)
Immature Granulocytes: 0 %
LYMPHS PCT: 19 %
Lymphs Abs: 0.8 10*3/uL (ref 0.7–4.0)
MCH: 28.3 pg (ref 26.0–34.0)
MCHC: 31.6 g/dL (ref 30.0–36.0)
MCV: 89.6 fL (ref 80.0–100.0)
Monocytes Absolute: 0.4 10*3/uL (ref 0.1–1.0)
Monocytes Relative: 9 %
NRBC: 0 % (ref 0.0–0.2)
Neutro Abs: 2.9 10*3/uL (ref 1.7–7.7)
Neutrophils Relative %: 68 %
PLATELETS: 239 10*3/uL (ref 150–400)
RBC: 3.85 MIL/uL — ABNORMAL LOW (ref 3.87–5.11)
RDW: 14.6 % (ref 11.5–15.5)
WBC Count: 4.3 10*3/uL (ref 4.0–10.5)

## 2017-11-24 MED ORDER — HEPARIN SOD (PORK) LOCK FLUSH 100 UNIT/ML IV SOLN
500.0000 [IU] | Freq: Once | INTRAVENOUS | Status: AC | PRN
  Administered 2017-11-24: 500 [IU]
  Filled 2017-11-24: qty 5

## 2017-11-24 MED ORDER — SODIUM CHLORIDE 0.9 % IV SOLN
Freq: Once | INTRAVENOUS | Status: AC
  Administered 2017-11-24: 10:00:00 via INTRAVENOUS
  Filled 2017-11-24: qty 250

## 2017-11-24 MED ORDER — DIPHENHYDRAMINE HCL 25 MG PO CAPS
50.0000 mg | ORAL_CAPSULE | Freq: Once | ORAL | Status: AC
  Administered 2017-11-24: 50 mg via ORAL

## 2017-11-24 MED ORDER — SODIUM CHLORIDE 0.9% FLUSH
10.0000 mL | INTRAVENOUS | Status: DC | PRN
  Administered 2017-11-24: 10 mL
  Filled 2017-11-24: qty 10

## 2017-11-24 MED ORDER — ACETAMINOPHEN 325 MG PO TABS
ORAL_TABLET | ORAL | Status: AC
  Filled 2017-11-24: qty 2

## 2017-11-24 MED ORDER — TRASTUZUMAB CHEMO 150 MG IV SOLR
450.0000 mg | Freq: Once | INTRAVENOUS | Status: AC
  Administered 2017-11-24: 450 mg via INTRAVENOUS
  Filled 2017-11-24: qty 21.43

## 2017-11-24 MED ORDER — DIPHENHYDRAMINE HCL 25 MG PO CAPS
ORAL_CAPSULE | ORAL | Status: AC
  Filled 2017-11-24: qty 2

## 2017-11-24 MED ORDER — ACETAMINOPHEN 325 MG PO TABS
650.0000 mg | ORAL_TABLET | Freq: Once | ORAL | Status: AC
  Administered 2017-11-24: 650 mg via ORAL

## 2017-11-24 NOTE — Patient Instructions (Signed)
[facility] Discharge Instructions for Patients Receiving Chemotherapy  Today you received the following chemotherapy agents: Trastuzumab (Herceptin).  To help prevent nausea and vomiting after your treatment, we encourage you to take your nausea medication as prescribed. If you develop nausea and vomiting that is not controlled by your nausea medication, call the clinic.   BELOW ARE SYMPTOMS THAT SHOULD BE REPORTED IMMEDIATELY:  *FEVER GREATER THAN 100.5 F  *CHILLS WITH OR WITHOUT FEVER  NAUSEA AND VOMITING THAT IS NOT CONTROLLED WITH YOUR NAUSEA MEDICATION  *UNUSUAL SHORTNESS OF BREATH  *UNUSUAL BRUISING OR BLEEDING  TENDERNESS IN MOUTH AND THROAT WITH OR WITHOUT PRESENCE OF ULCERS  *URINARY PROBLEMS  *BOWEL PROBLEMS  UNUSUAL RASH Items with * indicate a potential emergency and should be followed up as soon as possible.  Feel free to call the clinic should you have any questions or concerns. The clinic phone number is [phone].  Please show the CHEMO ALERT CARD at check-in to the Emergency Department and triage nurse.   

## 2017-11-24 NOTE — Telephone Encounter (Signed)
Mailed completed form (combined Insurance co of [place]) to patient per her request.

## 2017-11-25 ENCOUNTER — Encounter: Payer: Self-pay | Admitting: Hematology

## 2017-11-26 DIAGNOSIS — I2510 Atherosclerotic heart disease of native coronary artery without angina pectoris: Secondary | ICD-10-CM | POA: Diagnosis not present

## 2017-11-26 DIAGNOSIS — E119 Type 2 diabetes mellitus without complications: Secondary | ICD-10-CM | POA: Diagnosis not present

## 2017-11-26 DIAGNOSIS — T451X5D Adverse effect of antineoplastic and immunosuppressive drugs, subsequent encounter: Secondary | ICD-10-CM | POA: Diagnosis not present

## 2017-11-26 DIAGNOSIS — C50212 Malignant neoplasm of upper-inner quadrant of left female breast: Secondary | ICD-10-CM | POA: Diagnosis not present

## 2017-11-26 DIAGNOSIS — I10 Essential (primary) hypertension: Secondary | ICD-10-CM | POA: Diagnosis not present

## 2017-11-26 DIAGNOSIS — G620 Drug-induced polyneuropathy: Secondary | ICD-10-CM | POA: Diagnosis not present

## 2017-11-30 DIAGNOSIS — C50212 Malignant neoplasm of upper-inner quadrant of left female breast: Secondary | ICD-10-CM | POA: Diagnosis not present

## 2017-11-30 DIAGNOSIS — I2510 Atherosclerotic heart disease of native coronary artery without angina pectoris: Secondary | ICD-10-CM | POA: Diagnosis not present

## 2017-11-30 DIAGNOSIS — T451X5D Adverse effect of antineoplastic and immunosuppressive drugs, subsequent encounter: Secondary | ICD-10-CM | POA: Diagnosis not present

## 2017-11-30 DIAGNOSIS — I10 Essential (primary) hypertension: Secondary | ICD-10-CM | POA: Diagnosis not present

## 2017-11-30 DIAGNOSIS — G620 Drug-induced polyneuropathy: Secondary | ICD-10-CM | POA: Diagnosis not present

## 2017-11-30 DIAGNOSIS — E119 Type 2 diabetes mellitus without complications: Secondary | ICD-10-CM | POA: Diagnosis not present

## 2017-12-02 DIAGNOSIS — T451X5D Adverse effect of antineoplastic and immunosuppressive drugs, subsequent encounter: Secondary | ICD-10-CM | POA: Diagnosis not present

## 2017-12-02 DIAGNOSIS — I10 Essential (primary) hypertension: Secondary | ICD-10-CM | POA: Diagnosis not present

## 2017-12-02 DIAGNOSIS — E119 Type 2 diabetes mellitus without complications: Secondary | ICD-10-CM | POA: Diagnosis not present

## 2017-12-02 DIAGNOSIS — C50212 Malignant neoplasm of upper-inner quadrant of left female breast: Secondary | ICD-10-CM | POA: Diagnosis not present

## 2017-12-02 DIAGNOSIS — G620 Drug-induced polyneuropathy: Secondary | ICD-10-CM | POA: Diagnosis not present

## 2017-12-02 DIAGNOSIS — I2510 Atherosclerotic heart disease of native coronary artery without angina pectoris: Secondary | ICD-10-CM | POA: Diagnosis not present

## 2017-12-08 ENCOUNTER — Inpatient Hospital Stay: Payer: Medicare Other | Attending: Hematology | Admitting: Internal Medicine

## 2017-12-08 VITALS — BP 177/80 | HR 82 | Temp 98.3°F | Resp 18 | Ht 64.0 in | Wt 168.4 lb

## 2017-12-08 DIAGNOSIS — J439 Emphysema, unspecified: Secondary | ICD-10-CM | POA: Diagnosis not present

## 2017-12-08 DIAGNOSIS — H401131 Primary open-angle glaucoma, bilateral, mild stage: Secondary | ICD-10-CM | POA: Diagnosis not present

## 2017-12-08 DIAGNOSIS — Z9221 Personal history of antineoplastic chemotherapy: Secondary | ICD-10-CM

## 2017-12-08 DIAGNOSIS — C50212 Malignant neoplasm of upper-inner quadrant of left female breast: Secondary | ICD-10-CM | POA: Insufficient documentation

## 2017-12-08 DIAGNOSIS — Z9012 Acquired absence of left breast and nipple: Secondary | ICD-10-CM | POA: Diagnosis not present

## 2017-12-08 DIAGNOSIS — I700 Atherosclerosis of aorta: Secondary | ICD-10-CM | POA: Insufficient documentation

## 2017-12-08 DIAGNOSIS — I723 Aneurysm of iliac artery: Secondary | ICD-10-CM | POA: Diagnosis not present

## 2017-12-08 DIAGNOSIS — E871 Hypo-osmolality and hyponatremia: Secondary | ICD-10-CM | POA: Diagnosis not present

## 2017-12-08 DIAGNOSIS — Z170 Estrogen receptor positive status [ER+]: Secondary | ICD-10-CM | POA: Diagnosis not present

## 2017-12-08 DIAGNOSIS — Z87891 Personal history of nicotine dependence: Secondary | ICD-10-CM | POA: Insufficient documentation

## 2017-12-08 DIAGNOSIS — G620 Drug-induced polyneuropathy: Secondary | ICD-10-CM | POA: Insufficient documentation

## 2017-12-08 DIAGNOSIS — Z794 Long term (current) use of insulin: Secondary | ICD-10-CM | POA: Insufficient documentation

## 2017-12-08 DIAGNOSIS — Z79899 Other long term (current) drug therapy: Secondary | ICD-10-CM | POA: Diagnosis not present

## 2017-12-08 DIAGNOSIS — J45909 Unspecified asthma, uncomplicated: Secondary | ICD-10-CM | POA: Insufficient documentation

## 2017-12-08 DIAGNOSIS — E876 Hypokalemia: Secondary | ICD-10-CM | POA: Diagnosis not present

## 2017-12-08 DIAGNOSIS — Z923 Personal history of irradiation: Secondary | ICD-10-CM

## 2017-12-08 DIAGNOSIS — E1142 Type 2 diabetes mellitus with diabetic polyneuropathy: Secondary | ICD-10-CM | POA: Insufficient documentation

## 2017-12-08 DIAGNOSIS — Z7982 Long term (current) use of aspirin: Secondary | ICD-10-CM | POA: Insufficient documentation

## 2017-12-08 DIAGNOSIS — M1990 Unspecified osteoarthritis, unspecified site: Secondary | ICD-10-CM | POA: Insufficient documentation

## 2017-12-08 DIAGNOSIS — C50412 Malignant neoplasm of upper-outer quadrant of left female breast: Secondary | ICD-10-CM | POA: Diagnosis not present

## 2017-12-08 DIAGNOSIS — I2510 Atherosclerotic heart disease of native coronary artery without angina pectoris: Secondary | ICD-10-CM | POA: Diagnosis not present

## 2017-12-08 DIAGNOSIS — E119 Type 2 diabetes mellitus without complications: Secondary | ICD-10-CM | POA: Diagnosis not present

## 2017-12-08 DIAGNOSIS — H25813 Combined forms of age-related cataract, bilateral: Secondary | ICD-10-CM | POA: Diagnosis not present

## 2017-12-08 DIAGNOSIS — E785 Hyperlipidemia, unspecified: Secondary | ICD-10-CM | POA: Diagnosis not present

## 2017-12-08 DIAGNOSIS — T451X5A Adverse effect of antineoplastic and immunosuppressive drugs, initial encounter: Secondary | ICD-10-CM

## 2017-12-08 DIAGNOSIS — Z5112 Encounter for antineoplastic immunotherapy: Secondary | ICD-10-CM | POA: Insufficient documentation

## 2017-12-08 DIAGNOSIS — I10 Essential (primary) hypertension: Secondary | ICD-10-CM | POA: Insufficient documentation

## 2017-12-08 MED ORDER — LIDOCAINE 3.5 % EX PTCH: 1.0000 | MEDICATED_PATCH | Freq: Every day | CUTANEOUS | 3 refills | Status: DC | PRN

## 2017-12-08 NOTE — Progress Notes (Signed)
[facility] [place], [place] [address] [phone]   Interval Evaluation  Date of Service: 12/08/17 Patient Name: Penny Huynh Patient MRN: [id number] Patient DOB: 07/29/1933 Provider: Ventura Sellers, MD  Identifying Statement:  Penny Huynh is a 84 y.o. female with Chemotherapy-induced peripheral neuropathy ([place]) [G62.0, T45.1X5A]   Oncologic History: Oncology History   Cancer Staging Carcinoma of upper-inner quadrant of left breast in female, estrogen receptor positive ([place]) Staging form: Breast, AJCC 8th Edition - Clinical stage from 01/18/2017: Stage IB (cT2, cN0, cM0, G3, ER: Positive, PR: Positive, HER2: Positive) - Signed by Truitt Merle, MD on 02/10/2017 - Pathologic stage from 02/23/2017: Stage IB (pT3, pN0, cM0, G3, ER: Positive, PR: Positive, HER2: Positive) - Signed by Alla Feeling, NP on 03/16/2017  Carcinoma of upper-outer quadrant of left breast in female, estrogen receptor positive ([place]) Staging form: Breast, AJCC 8th Edition - Clinical stage from 01/18/2017: Stage IB (cT2, cN0, cM0, G2, ER: Positive, PR: Positive, HER2: Negative) - Unsigned - Pathologic stage from 02/23/2017: Stage IB (pT2, pN0(sn), cM0, G3, ER: Positive, PR: Positive, HER2: Negative) - Signed by Alla Feeling, NP on 03/16/2017       Carcinoma of upper-outer quadrant of left breast in female, estrogen receptor positive ([place])   01/13/2017 Mammogram    FINDINGS: In the left breast, a spiculated mass lies in the upper outer quadrant. In the medial posterior left breast, there is a larger lobulated mass. These masses correspond to the palpable abnormalities. No other discrete left breast masses.  In the right breast, there are no discrete masses. There are no areas of architectural distortion.  In both breasts there are scattered calcifications, rounded punctate, with a few other larger coarse dystrophic calcifications, without  significant change from the previous screening mammogram, which is dated 12/04/2009.  Mammographic images were processed with CAD.  On physical exam, there is a firm palpable mass in the upper outer left breast and another firm mass in the medial left breast.     01/13/2017 Breast US    Targeted ultrasound is performed, showing an irregular hypoechoic shadowing mass in the left breast at the 2:30 o'clock position, 5 cm the nipple, measuring 3.3 x 2.8 x 2.6 cm. In the 10:30 o'clock position of the left breast, 5 cm the nipple, there is irregular hypoechoic mass with somewhat more lobulated margins, corresponding to the lobulated mass seen mammographically. This measures 4.8 x 2.9 x 4.8 cm. Both masses show internal vascularity on color Doppler analysis.  Sonographic evaluation of the left axilla shows several nodes with thickened cortices. Status cortex measured is 5 mm. None of these lymph nodes appear enlarged but overall size criteria.  IMPRESSION: 1. Two masses in the left breast, 1 at the 2:30 o'clock position in the other at the 10:30 o'clock position, both highly suspicious for breast carcinoma. 2. Several abnormal left axillary lymph nodes with thickened cortices. 3. No evidence of right breast malignancy.     01/18/2017 Pathology Results    Diagnosis 1. Breast, left, needle core biopsy, 2:30 o'clock - INVASIVE MAMMARY CARCINOMA - MAMMARY CARCINOMA IN-SITU - SEE COMMENT 2. Lymph node, needle/core biopsy, left axilla - NO CARCINOMA IDENTIFIED - SEE COMMENT 3. Breast, left, needle core biopsy, 10:30 o'clock - INVASIVE DUCTAL CARCINOMA - SEE COMMENT  1. PROGNOSTIC INDICATORS Results: IMMUNOHISTOCHEMICAL AND MORPHOMETRIC ANALYSIS PERFORMED MANUALLY Estrogen Receptor: 100%, POSITIVE, STRONG STAINING INTENSITY Progesterone Receptor: 60%, POSITIVE, STRONG STAINING INTENSITY Proliferation  Marker Ki67: 60%  1. FLUORESCENCE IN-SITU HYBRIDIZATION Results: HER2  - NEGATIVE RATIO OF HER2/CEP17 SIGNALS 1.28 AVERAGE HER2 COPY NUMBER PER CELL 2.75  3. PROGNOSTIC INDICATORS Results: IMMUNOHISTOCHEMICAL AND MORPHOMETRIC ANALYSIS PERFORMED MANUALLY Estrogen Receptor: 100%, POSITIVE, STRONG STAINING INTENSITY Progesterone Receptor: 50%, POSITIVE, STRONG STAINING INTENSITY Proliferation Marker Ki67: 70%  3. FLUORESCENCE IN-SITU HYBRIDIZATION Results: HER2 - **POSITIVE** RATIO OF HER2/CEP17 SIGNALS 2.80 AVERAGE HER2 COPY NUMBER PER CELL 6.15  Microscopic Comment 1. The biopsy material shows an infiltrative proliferation of cells with large vesicular nuclei with inconspicuous nucleoli, arranged linearly and in small clusters. Based on the biopsy, the carcinoma appears Nottingham grade 2 of 3 and measures 0.9 cm in greatest linear extent. E-cadherin and prognostic markers (ER/PR/ki-67/HER2-FISH)are pending and will be reported in an addendum. Dr. Lyndon Code reviewed the case and agrees with the above diagnosis.    02/10/2017 Initial Diagnosis    Carcinoma of upper-outer quadrant of left breast in female, estrogen receptor positive ([place])    02/19/2017 Imaging    Bone Scan whole Body 02/19/17 IMPRESSION: Negative for evidence of bony metastatic disease.    02/19/2017 Imaging    CT CAP W Contrast 02/19/17 IMPRESSION: 1. Small right middle lobe pulmonary nodules up to 0.8 cm, nonspecific. Non-contrast chest CT at 3-6 months is recommended. If the nodules are stable at time of repeat CT, then future CT at 18-24 months (from today's scan) is considered optional for low-risk patients, but is recommended for high-risk patients. This recommendation follows the consensus statement: Guidelines for Management of Incidental Pulmonary Nodules Detected on CT Images: From the Fleischner Society 2017; Radiology 2017; 284:228-243. 2. Subcentimeter hepatic lesions likely cysts but incompletely characterized due to small size. Attention on follow-up imaging recommended. 3.  2.1 cm fusiform right common iliac artery aneurysm. Continued surveillance recommended. 4. Aortic Atherosclerosis (ICD10-I70.0) and Emphysema (ICD10-J43.9).    02/23/2017 Pathology Results    Diagnosis 1. Breast, simple mastectomy, Left Total - INVASIVE DUCTAL CARCINOMA, MULTIFOCAL, NOTTINGHAM GRADE 3/3 (5.3 CM, 3.5 CM) - INVASIVE CARCINOMA INVOLVES THE DERMIS - DUCTAL CARCINOMA IN SITU, INTERMEDIATE GRADE - HYALINIZED FIBROADENOMA - MARGINS UNINVOLVED BY CARCINOMA - NO CARCINOMA IDENTIFIED IN TWO LYMPH NODES (0/2) - SEE ONCOLOGY TABLE AND COMMENT BELOW 2. Lymph node, sentinel, biopsy, Left Axillary - NO CARCINOMA IDENTIFIED IN ONE LYMPH NODE (0/1) 3. Lymph node, sentinel, biopsy - NO CARCINOMA IDENTIFIED IN ONE LYMPH NODE (0/1) 4. Lymph node, sentinel, biopsy - NO CARCINOMA IDENTIFIED IN ONE LYMPH NODE (0/1) 5. Lymph node, sentinel, biopsy - NO CARCINOMA IDENTIFIED IN ONE LYMPH NODE (0/1) Microscopic Comment 1. BREAST, INVASIVE TUMOR Procedure: Simple mastectomy Laterality: Left Tumor Size: 5. 3 cm, 3.5 cm Histologic Type: Invasive carcinoma of no special type (ductal, not otherwise specified) Grade: Nottingham Grade 3 Tubular Differentiation: 3 Nuclear Pleomorphism: 3 Mitotic Count: 2 Ductal Carcinoma in Situ (DCIS): Present Extent of Tumor: Skin: Invasive carcinoma directly invades into the dermis or epidermis without skin ulceration Margins: Invasive carcinoma, distance from closest margin: 1.5 cm (posterior) DCIS, distance from closest margin: > 1 cm Regional Lymph Nodes: Number of Lymph Nodes Examined: 6 Number of Sentinel Lymph Nodes Examined: 4 Lymph Nodes with Macrometastases: 0 Lymph Nodes with Micrometastases: 0 Lymph Nodes with Isolated Tumor Cells: 0 Treatment effect: No known presurgical therapy Breast Prognostic Profile: See Also ([id number]) Estrogen Receptor: Positive (100%, strong); Positive (100%, strong) Progesterone Receptor: Positive (50%,  strong); Positive (50%, strong) Her2: Positive (Ratio 2.80); Negative (Ratio 1.28) Ki-67: 70%; 60% Best tumor block for sendout testing:  1E Pathologic Stage Classification (pTNM, AJCC 8th Edition): Primary Tumor: mpT3 Regional Lymph Nodes: pN0 COMMENT: The two invasive carcinomas in the breast have slightly different morphologies. The larger lesion has a papillary and micropapillary pattern admixed with typical ductal carcinoma while the smaller lesion is more typical of a ductal lesion with linear arrays (E-cadherin performed on biopsy). There are foci within the larger lesion that are concerning for lymphovascular space invasion.     04/07/2017 -  Chemotherapy    weekly taxol and herceptin x12 weeks, 04/07/17-06/23/17. Then herceptin q3 weeks for total 1 year which was switched to Kadcyla on 07/14/17.    07/20/2017 - 09/03/2017 Radiation Therapy    She started adjuvant radiation on 07/20/17 with Dr. Lisbeth Renshaw and plans to complete on 09/03/17.     08/24/2017 -  Chemotherapy    maintenance Herceptin      Interval History:  MADINE SARR presents today for neuropathy follow up.  She unfortunately does not describe any improvement in symptoms since Lyrica titration was initiated.  Still feels somewhat off balance as well.  Continues to take Elavil and Lyrica.  There is no drug that has been prescribed that has helped her symptoms.  H+P: presents today to discuss her neuropathy symptoms.  She describes painful burning affecting her feet and lower to mid-shins symmetrically.  This started sometime after taxol chemotherapy was started in March (continued through May) and has persisted or even progressed.  At this time she also describes feeling of numbness and tingling affecting her finger tips on both hands.  Her walking is impaired because of poor sensation and balance, she is requiring a walker fora ambulation.  There is also noted swelling of both feet.  She does acknowledge a milder version of these  symptoms, affecting the bottoms of her feet only, which were present prior to the onset of chemotherapy.  She has long-standing diagnosis of diabetes.  Presently she is receiving Herceptin and no further cytotoxic chemotherapy.  Taking Gabapentin 688m 3x day without efficacy.  Medications: Current Outpatient Medications on File Prior to Visit  Medication Sig Dispense Refill  . amitriptyline (ELAVIL) 50 MG tablet Take 1 tablet (50 mg total) by mouth at bedtime. 30 tablet 3  . amLODipine (NORVASC) 5 MG tablet Take 1 tablet (5 mg total) by mouth daily. 30 tablet 2  . anastrozole (ARIMIDEX) 1 MG tablet Take 1 tablet (1 mg total) by mouth daily. 30 tablet 1  . aspirin 81 MG EC tablet Take 81 mg by mouth at bedtime.     . chlorthalidone (HYGROTON) 25 MG tablet Take 25 mg by mouth daily.     . Coenzyme Q10 (COQ10) 100 MG CAPS Take 100 mg by mouth daily.      .Marland KitchenHYDROcodone-acetaminophen (NORCO/VICODIN) 5-325 MG tablet Take 1 tablet by mouth every 6 (six) hours as needed for moderate pain. 40 tablet 0  . latanoprost (XALATAN) 0.005 % ophthalmic solution Place 1 drop into both eyes at bedtime.    . lidocaine-prilocaine (EMLA) cream APPLY CREAM TOPICALLY TO AFFECTED AREA ONCE  3  . losartan (COZAAR) 50 MG tablet Take 50 mg by mouth daily.    . metFORMIN (GLUCOPHAGE) 500 MG tablet Take 1,000 mg by mouth daily with breakfast.     . pregabalin (LYRICA) 75 MG capsule Take 2 capsules (150 mg total) by mouth 2 (two) times daily. For first week of treatment, take 76mtwo times daily 120 capsule 1  . vitamin B-12 (CYANOCOBALAMIN) 1000 MCG tablet  Take 1,000 mcg by mouth daily.    . [DISCONTINUED] prochlorperazine (COMPAZINE) 10 MG tablet Take 1 tablet (10 mg total) by mouth every 6 (six) hours as needed (Nausea or vomiting). 30 tablet 1   No current facility-administered medications on file prior to visit.     Allergies: No Known Allergies Past Medical History:  Past Medical History:  Diagnosis Date  .  Arthritis    "maybe a little in my left knee" (02/23/2017)  . Asthmatic bronchitis   . Cancer of left breast ([place])   . Coronary atherosclerosis    Minor nonobstructive CAD at cardiac catheterization 2009  . Dyslipidemia   . Femoral bruit 7/08   With normal ABIs  . Hypertension   . Lower extremity edema    Chronic  . Pneumonia ~ 1950   in 8th grade  . Type II diabetes mellitus ([place])    Past Surgical History:  Past Surgical History:  Procedure Laterality Date  . APPENDECTOMY    . BREAST BIOPSY Left 02/2017  . CARDIAC CATHETERIZATION     "long time ago" (02/23/2017)  . MASTECTOMY COMPLETE / SIMPLE W/ SENTINEL NODE BIOPSY Left 02/23/2017   TOTAL MATECTOMY;  LEFT AXILLARY SENTINEL LYMPH NODE BIOPSY ERAS PATHWAY  . MASTECTOMY WITH RADIOACTIVE SEED GUIDED EXCISION AND AXILLARY SENTINEL LYMPH NODE BIOPSY Left 02/23/2017   Procedure: LEFT TOTAL MATECTOMY;  LEFT AXILLARY SENTINEL LYMPH NODE BIOPSY ERAS PATHWAY;  Surgeon: Excell Seltzer, MD;  Location: [facility];  Service: General;  Laterality: Left;  . PILONIDAL CYST EXCISION    . PORTA CATH INSERTION  02/23/2017  . PORTACATH PLACEMENT N/A 02/23/2017   Procedure: INSERTION PORT-A-CATH;  Surgeon: Excell Seltzer, MD;  Location: [facility];  Service: General;  Laterality: N/A;  . SHOULDER ARTHROSCOPY W/ ROTATOR CUFF REPAIR Right    Social History:  Social History   Socioeconomic History  . Marital status: Married    Spouse name: Not on file  . Number of children: 2  . Years of education: Not on file  . Highest education level: Not on file  Occupational History  . Occupation: Counselling psychologist for [employer]    Comment: only works during tax season but not currently   . Occupation: in Dentist   . Occupation: retired Mining engineer of education     Comment: [place]  . Financial resource strain: Not on file  . Food insecurity:    Worry: Not on file    Inability: Not on file  . Transportation needs:    Medical: Not on file    Non-medical:  Not on file  Tobacco Use  . Smoking status: Former Smoker    Packs/day: 2.00    Years: 46.00    Pack years: 92.00    Types: Cigarettes    Start date: 11/09/1953    Last attempt to quit: 2000    Years since quitting: 19.8  . Smokeless tobacco: Never Used  Substance and Sexual Activity  . Alcohol use: Yes    Alcohol/week: 2.0 standard drinks    Types: 2 Glasses of wine per week  . Drug use: No  . Sexual activity: Not on file  Lifestyle  . Physical activity:    Days per week: Not on file    Minutes per session: Not on file  . Stress: Not on file  Relationships  . Social connections:    Talks on phone: Not on file    Gets together: Not on file    Attends  religious service: Not on file    Active member of club or organization: Not on file    Attends meetings of clubs or organizations: Not on file    Relationship status: Not on file  . Intimate partner violence:    Fear of current or ex partner: Not on file    Emotionally abused: Not on file    Physically abused: Not on file    Forced sexual activity: Not on file  Other Topics Concern  . Not on file  Social History Narrative   Lives in [place] with her husband.   Has 2 grown children.   10-25-17 Unable to ask abuse questions husband with her today.   Family History:  Family History  Problem Relation Age of Onset  . Diabetes Mother   . Stroke Father   . Cancer Father        unknown type     Review of Systems: Constitutional: Denies fevers, chills or abnormal weight loss Eyes: Denies blurriness of vision Ears, nose, mouth, throat, and face: Denies mucositis or sore throat Respiratory: Denies cough, dyspnea or wheezes Cardiovascular: Denies palpitation, chest discomfort or lower extremity swelling Gastrointestinal:  Denies nausea, constipation, diarrhea GU: Denies dysuria or incontinence Skin: Denies abnormal skin rashes Neurological: Per HPI Musculoskeletal: +knee pain Behavioral/Psych: Denies anxiety, disturbance in  thought content, and mood instability   Physical Exam: Vitals:   12/08/17 0912  BP: (!) 177/80  Pulse: 82  Resp: 18  Temp: 98.3 F (36.8 C)  SpO2: 100%   KPS: 60. General: Alert, cooperative, pleasant, in no acute distress Head: Craniotomy scar noted, dry and intact. EENT: No conjunctival injection or scleral icterus. Oral mucosa moist Lungs: Resp effort normal Cardiac: Regular rate and rhythm Abdomen: Soft, non-distended abdomen Skin: No rashes cyanosis or petechiae. Extremities: No clubbing or edema  Neurologic Exam: Mental Status: Awake, alert, attentive to examiner. Oriented to self and environment. Language is fluent with intact comprehension.  Cranial Nerves: Visual acuity is grossly normal. Visual fields are full. Extra-ocular movements intact. No ptosis. Face is symmetric, tongue midline. Motor: Tone and bulk are normal. Power is full in both arms and legs. Reflexes are decreased bilaterally, no pathologic reflexes present. Intact finger to nose bilaterally Sensory: Impaired to light touch and temp lower legs and fingertips Gait: Deferred   Labs: I have reviewed the data as listed    Component Value Date/Time   NA 133 (L) 11/24/2017 0858   K 3.9 11/24/2017 0858   CL 97 (L) 11/24/2017 0858   CO2 26 11/24/2017 0858   GLUCOSE 136 (H) 11/24/2017 0858   BUN 8 11/24/2017 0858   CREATININE 0.74 11/24/2017 0858   CALCIUM 9.6 11/24/2017 0858   PROT 6.8 11/24/2017 0858   ALBUMIN 3.5 11/24/2017 0858   AST 21 11/24/2017 0858   ALT 14 11/24/2017 0858   ALKPHOS 69 11/24/2017 0858   BILITOT 0.6 11/24/2017 0858   GFRNONAA >60 11/24/2017 0858   GFRAA >60 11/24/2017 0858   Lab Results  Component Value Date   WBC 4.3 11/24/2017   NEUTROABS 2.9 11/24/2017   HGB 10.9 (L) 11/24/2017   HCT 34.5 (L) 11/24/2017   MCV 89.6 11/24/2017   PLT 239 11/24/2017      Assessment/Plan 1. Chemotherapy-induced peripheral neuropathy [facility])  Ms. Gilday has a symmetric distal  polyneuropathy which is secondary to taxol exposure combined with likely pre-existing diabetic neuropathy.  There is evidence of both small and large fiber involvement.    At this  time she has failed multiple drugs including now Pregabalin.   We discussed trying topical Lidocaine patches for local relief, given significant pain affecting the feet in particular.    In the meantime she should continue Lyrica 163m BID and Elavil 516mHS.  If patches are effective we can discuss drawing down these meds.  We appreciate the opportunity to participate in the care of MaAnsel Bong We ask that she return in 1-2 months for further evaluation.  All questions were answered. The patient knows to call the clinic with any problems, questions or concerns. No barriers to learning were detected.  The total time spent in the encounter was 25 minutes and more than 50% was on counseling and review of test results   ZaVentura SellersMD Medical Director of Neuro-Oncology [facility]1/06/19 8:58 AM

## 2017-12-09 ENCOUNTER — Other Ambulatory Visit: Payer: Self-pay | Admitting: Hematology

## 2017-12-15 ENCOUNTER — Inpatient Hospital Stay: Payer: Medicare Other

## 2017-12-15 ENCOUNTER — Encounter: Payer: Self-pay | Admitting: Nurse Practitioner

## 2017-12-15 ENCOUNTER — Inpatient Hospital Stay (HOSPITAL_BASED_OUTPATIENT_CLINIC_OR_DEPARTMENT_OTHER): Payer: Medicare Other | Admitting: Nurse Practitioner

## 2017-12-15 VITALS — BP 178/80 | HR 80 | Temp 98.9°F | Resp 17 | Ht 64.0 in | Wt 167.0 lb

## 2017-12-15 DIAGNOSIS — Z170 Estrogen receptor positive status [ER+]: Secondary | ICD-10-CM | POA: Diagnosis not present

## 2017-12-15 DIAGNOSIS — C50412 Malignant neoplasm of upper-outer quadrant of left female breast: Secondary | ICD-10-CM | POA: Diagnosis not present

## 2017-12-15 DIAGNOSIS — Z9012 Acquired absence of left breast and nipple: Secondary | ICD-10-CM | POA: Diagnosis not present

## 2017-12-15 DIAGNOSIS — C50212 Malignant neoplasm of upper-inner quadrant of left female breast: Secondary | ICD-10-CM | POA: Diagnosis not present

## 2017-12-15 DIAGNOSIS — Z95828 Presence of other vascular implants and grafts: Secondary | ICD-10-CM

## 2017-12-15 DIAGNOSIS — E876 Hypokalemia: Secondary | ICD-10-CM | POA: Diagnosis not present

## 2017-12-15 DIAGNOSIS — Z923 Personal history of irradiation: Secondary | ICD-10-CM | POA: Diagnosis not present

## 2017-12-15 DIAGNOSIS — G620 Drug-induced polyneuropathy: Secondary | ICD-10-CM

## 2017-12-15 DIAGNOSIS — T451X5A Adverse effect of antineoplastic and immunosuppressive drugs, initial encounter: Secondary | ICD-10-CM | POA: Diagnosis not present

## 2017-12-15 DIAGNOSIS — Z9221 Personal history of antineoplastic chemotherapy: Secondary | ICD-10-CM

## 2017-12-15 DIAGNOSIS — E871 Hypo-osmolality and hyponatremia: Secondary | ICD-10-CM | POA: Diagnosis not present

## 2017-12-15 DIAGNOSIS — Z1231 Encounter for screening mammogram for malignant neoplasm of breast: Secondary | ICD-10-CM

## 2017-12-15 DIAGNOSIS — T451X5S Adverse effect of antineoplastic and immunosuppressive drugs, sequela: Secondary | ICD-10-CM

## 2017-12-15 DIAGNOSIS — Z5112 Encounter for antineoplastic immunotherapy: Secondary | ICD-10-CM | POA: Diagnosis not present

## 2017-12-15 DIAGNOSIS — Z79899 Other long term (current) drug therapy: Secondary | ICD-10-CM

## 2017-12-15 LAB — CMP (CANCER CENTER ONLY)
ALBUMIN: 3.7 g/dL (ref 3.5–5.0)
ALT: 13 U/L (ref 0–44)
ANION GAP: 7 (ref 5–15)
AST: 21 U/L (ref 15–41)
Alkaline Phosphatase: 71 U/L (ref 38–126)
BUN: 10 mg/dL (ref 8–23)
CO2: 28 mmol/L (ref 22–32)
Calcium: 9.5 mg/dL (ref 8.9–10.3)
Chloride: 96 mmol/L — ABNORMAL LOW (ref 98–111)
Creatinine: 0.76 mg/dL (ref 0.44–1.00)
GFR, Estimated: >60 mL/min (ref >60)
GLUCOSE: 74 mg/dL (ref 70–99)
Potassium: 3.8 mmol/L (ref 3.5–5.1)
SODIUM: 131 mmol/L — AB (ref 135–145)
Total Bilirubin: 0.6 mg/dL (ref 0.3–1.2)
Total Protein: 7 g/dL (ref 6.5–8.1)

## 2017-12-15 LAB — CBC WITH DIFFERENTIAL (CANCER CENTER ONLY)
Abs Immature Granulocytes: 0.01 10*3/uL (ref 0.00–0.07)
BASOS ABS: 0.1 10*3/uL (ref 0.0–0.1)
Basophils Relative: 2 %
EOS ABS: 0.1 10*3/uL (ref 0.0–0.5)
Eosinophils Relative: 3 %
HEMATOCRIT: 35.4 % — AB (ref 36.0–46.0)
Hemoglobin: 11.2 g/dL — ABNORMAL LOW (ref 12.0–15.0)
Immature Granulocytes: 0 %
LYMPHS ABS: 1 10*3/uL (ref 0.7–4.0)
Lymphocytes Relative: 31 %
MCH: 28.7 pg (ref 26.0–34.0)
MCHC: 31.6 g/dL (ref 30.0–36.0)
MCV: 90.8 fL (ref 80.0–100.0)
MONOS PCT: 15 %
Monocytes Absolute: 0.5 10*3/uL (ref 0.1–1.0)
NRBC: 0 % (ref 0.0–0.2)
Neutro Abs: 1.6 10*3/uL — ABNORMAL LOW (ref 1.7–7.7)
Neutrophils Relative %: 49 %
Platelet Count: 261 10*3/uL (ref 150–400)
RBC: 3.9 MIL/uL (ref 3.87–5.11)
RDW: 14.1 % (ref 11.5–15.5)
WBC Count: 3.2 10*3/uL — ABNORMAL LOW (ref 4.0–10.5)

## 2017-12-15 MED ORDER — TRASTUZUMAB CHEMO 150 MG IV SOLR
450.0000 mg | Freq: Once | INTRAVENOUS | Status: AC
  Administered 2017-12-15: 450 mg via INTRAVENOUS
  Filled 2017-12-15: qty 21.43

## 2017-12-15 MED ORDER — HEPARIN SOD (PORK) LOCK FLUSH 100 UNIT/ML IV SOLN
500.0000 [IU] | Freq: Once | INTRAVENOUS | Status: AC | PRN
  Administered 2017-12-15: 500 [IU]
  Filled 2017-12-15: qty 5

## 2017-12-15 MED ORDER — DIPHENHYDRAMINE HCL 25 MG PO CAPS
ORAL_CAPSULE | ORAL | Status: AC
  Filled 2017-12-15: qty 2

## 2017-12-15 MED ORDER — ACETAMINOPHEN 325 MG PO TABS
ORAL_TABLET | ORAL | Status: AC
  Filled 2017-12-15: qty 2

## 2017-12-15 MED ORDER — DIPHENHYDRAMINE HCL 25 MG PO CAPS
50.0000 mg | ORAL_CAPSULE | Freq: Once | ORAL | Status: AC
  Administered 2017-12-15: 50 mg via ORAL

## 2017-12-15 MED ORDER — ACETAMINOPHEN 325 MG PO TABS
650.0000 mg | ORAL_TABLET | Freq: Once | ORAL | Status: AC
  Administered 2017-12-15: 650 mg via ORAL

## 2017-12-15 MED ORDER — SODIUM CHLORIDE 0.9 % IV SOLN
Freq: Once | INTRAVENOUS | Status: AC
  Administered 2017-12-15: 13:00:00 via INTRAVENOUS
  Filled 2017-12-15: qty 250

## 2017-12-15 MED ORDER — SODIUM CHLORIDE 0.9% FLUSH
10.0000 mL | INTRAVENOUS | Status: DC | PRN
  Administered 2017-12-15: 10 mL
  Filled 2017-12-15: qty 10

## 2017-12-15 NOTE — Progress Notes (Signed)
[place]  [phone] [phone]  Clinic Follow up Note   Patient Care Team: Sasser, Silvestre Moment, MD as PCP - General (Cardiology) [name], MD as PCP - Cardiology (Cardiology) Truitt Merle, MD as Consulting Physician (Hematology) Excell Seltzer, MD as Consulting Physician (General Surgery) Alla Feeling, NP as Nurse Practitioner (Nurse Practitioner) [name], MD as Attending Physician (Cardiology) Date of Service: 12/16/17   SUMMARY OF ONCOLOGIC HISTORY: Oncology History   Cancer Staging Carcinoma of upper-inner quadrant of left breast in female, estrogen receptor positive ([place]) Staging form: Breast, AJCC 8th Edition - Clinical stage from 01/18/2017: Stage IB (cT2, cN0, cM0, G3, ER: Positive, PR: Positive, HER2: Positive) - Signed by Truitt Merle, MD on 02/10/2017 - Pathologic stage from 02/23/2017: Stage IB (pT3, pN0, cM0, G3, ER: Positive, PR: Positive, HER2: Positive) - Signed by Alla Feeling, NP on 03/16/2017  Carcinoma of upper-outer quadrant of left breast in female, estrogen receptor positive ([place]) Staging form: Breast, AJCC 8th Edition - Clinical stage from 01/18/2017: Stage IB (cT2, cN0, cM0, G2, ER: Positive, PR: Positive, HER2: Negative) - Unsigned - Pathologic stage from 02/23/2017: Stage IB (pT2, pN0(sn), cM0, G3, ER: Positive, PR: Positive, HER2: Negative) - Signed by Alla Feeling, NP on 03/16/2017       Carcinoma of upper-outer quadrant of left breast in female, estrogen receptor positive ([place])   01/13/2017 Mammogram    FINDINGS: In the left breast, a spiculated mass lies in the upper outer quadrant. In the medial posterior left breast, there is a larger lobulated mass. These masses correspond to the palpable abnormalities. No other discrete left breast masses.  In the right breast, there are no discrete masses. There are no areas of architectural distortion.  In both breasts there are scattered  calcifications, rounded punctate, with a few other larger coarse dystrophic calcifications, without significant change from the previous screening mammogram, which is dated 12/04/2009.  Mammographic images were processed with CAD.  On physical exam, there is a firm palpable mass in the upper outer left breast and another firm mass in the medial left breast.     01/13/2017 Breast US    Targeted ultrasound is performed, showing an irregular hypoechoic shadowing mass in the left breast at the 2:30 o'clock position, 5 cm the nipple, measuring 3.3 x 2.8 x 2.6 cm. In the 10:30 o'clock position of the left breast, 5 cm the nipple, there is irregular hypoechoic mass with somewhat more lobulated margins, corresponding to the lobulated mass seen mammographically. This measures 4.8 x 2.9 x 4.8 cm. Both masses show internal vascularity on color Doppler analysis.  Sonographic evaluation of the left axilla shows several nodes with thickened cortices. Status cortex measured is 5 mm. None of these lymph nodes appear enlarged but overall size criteria.  IMPRESSION: 1. Two masses in the left breast, 1 at the 2:30 o'clock position in the other at the 10:30 o'clock position, both highly suspicious for breast carcinoma. 2. Several abnormal left axillary lymph nodes with thickened cortices. 3. No evidence of right breast malignancy.     01/18/2017 Pathology Results    Diagnosis 1. Breast, left, needle core biopsy, 2:30 o'clock - INVASIVE MAMMARY CARCINOMA - MAMMARY CARCINOMA IN-SITU - SEE COMMENT 2. Lymph node, needle/core biopsy, left axilla - NO CARCINOMA IDENTIFIED - SEE COMMENT 3. Breast, left, needle core biopsy, 10:30 o'clock - INVASIVE DUCTAL CARCINOMA - SEE COMMENT  1. PROGNOSTIC INDICATORS Results: IMMUNOHISTOCHEMICAL AND MORPHOMETRIC ANALYSIS PERFORMED MANUALLY Estrogen Receptor:  100%, POSITIVE, STRONG STAINING INTENSITY Progesterone Receptor: 60%, POSITIVE, STRONG  STAINING INTENSITY Proliferation Marker Ki67: 60%  1. FLUORESCENCE IN-SITU HYBRIDIZATION Results: HER2 - NEGATIVE RATIO OF HER2/CEP17 SIGNALS 1.28 AVERAGE HER2 COPY NUMBER PER CELL 2.75  3. PROGNOSTIC INDICATORS Results: IMMUNOHISTOCHEMICAL AND MORPHOMETRIC ANALYSIS PERFORMED MANUALLY Estrogen Receptor: 100%, POSITIVE, STRONG STAINING INTENSITY Progesterone Receptor: 50%, POSITIVE, STRONG STAINING INTENSITY Proliferation Marker Ki67: 70%  3. FLUORESCENCE IN-SITU HYBRIDIZATION Results: HER2 - **POSITIVE** RATIO OF HER2/CEP17 SIGNALS 2.80 AVERAGE HER2 COPY NUMBER PER CELL 6.15  Microscopic Comment 1. The biopsy material shows an infiltrative proliferation of cells with large vesicular nuclei with inconspicuous nucleoli, arranged linearly and in small clusters. Based on the biopsy, the carcinoma appears Nottingham grade 2 of 3 and measures 0.9 cm in greatest linear extent. E-cadherin and prognostic markers (ER/PR/ki-67/HER2-FISH)are pending and will be reported in an addendum. Dr. Lyndon Code reviewed the case and agrees with the above diagnosis.    02/10/2017 Initial Diagnosis    Carcinoma of upper-outer quadrant of left breast in female, estrogen receptor positive ([place])    02/19/2017 Imaging    Bone Scan whole Body 02/19/17 IMPRESSION: Negative for evidence of bony metastatic disease.    02/19/2017 Imaging    CT CAP W Contrast 02/19/17 IMPRESSION: 1. Small right middle lobe pulmonary nodules up to 0.8 cm, nonspecific. Non-contrast chest CT at 3-6 months is recommended. If the nodules are stable at time of repeat CT, then future CT at 18-24 months (from today's scan) is considered optional for low-risk patients, but is recommended for high-risk patients. This recommendation follows the consensus statement: Guidelines for Management of Incidental Pulmonary Nodules Detected on CT Images: From the Fleischner Society 2017; Radiology 2017; 284:228-243. 2. Subcentimeter hepatic lesions  likely cysts but incompletely characterized due to small size. Attention on follow-up imaging recommended. 3. 2.1 cm fusiform right common iliac artery aneurysm. Continued surveillance recommended. 4. Aortic Atherosclerosis (ICD10-I70.0) and Emphysema (ICD10-J43.9).    02/23/2017 Pathology Results    Diagnosis 1. Breast, simple mastectomy, Left Total - INVASIVE DUCTAL CARCINOMA, MULTIFOCAL, NOTTINGHAM GRADE 3/3 (5.3 CM, 3.5 CM) - INVASIVE CARCINOMA INVOLVES THE DERMIS - DUCTAL CARCINOMA IN SITU, INTERMEDIATE GRADE - HYALINIZED FIBROADENOMA - MARGINS UNINVOLVED BY CARCINOMA - NO CARCINOMA IDENTIFIED IN TWO LYMPH NODES (0/2) - SEE ONCOLOGY TABLE AND COMMENT BELOW 2. Lymph node, sentinel, biopsy, Left Axillary - NO CARCINOMA IDENTIFIED IN ONE LYMPH NODE (0/1) 3. Lymph node, sentinel, biopsy - NO CARCINOMA IDENTIFIED IN ONE LYMPH NODE (0/1) 4. Lymph node, sentinel, biopsy - NO CARCINOMA IDENTIFIED IN ONE LYMPH NODE (0/1) 5. Lymph node, sentinel, biopsy - NO CARCINOMA IDENTIFIED IN ONE LYMPH NODE (0/1) Microscopic Comment 1. BREAST, INVASIVE TUMOR Procedure: Simple mastectomy Laterality: Left Tumor Size: 5. 3 cm, 3.5 cm Histologic Type: Invasive carcinoma of no special type (ductal, not otherwise specified) Grade: Nottingham Grade 3 Tubular Differentiation: 3 Nuclear Pleomorphism: 3 Mitotic Count: 2 Ductal Carcinoma in Situ (DCIS): Present Extent of Tumor: Skin: Invasive carcinoma directly invades into the dermis or epidermis without skin ulceration Margins: Invasive carcinoma, distance from closest margin: 1.5 cm (posterior) DCIS, distance from closest margin: > 1 cm Regional Lymph Nodes: Number of Lymph Nodes Examined: 6 Number of Sentinel Lymph Nodes Examined: 4 Lymph Nodes with Macrometastases: 0 Lymph Nodes with Micrometastases: 0 Lymph Nodes with Isolated Tumor Cells: 0 Treatment effect: No known presurgical therapy Breast Prognostic Profile: See Also  ([id number]) Estrogen Receptor: Positive (100%, strong); Positive (100%, strong) Progesterone Receptor: Positive (50%, strong); Positive (50%, strong) Her2: Positive (Ratio  2.80); Negative (Ratio 1.28) Ki-67: 70%; 60% Best tumor block for sendout testing: 1E Pathologic Stage Classification (pTNM, AJCC 8th Edition): Primary Tumor: mpT3 Regional Lymph Nodes: pN0 COMMENT: The two invasive carcinomas in the breast have slightly different morphologies. The larger lesion has a papillary and micropapillary pattern admixed with typical ductal carcinoma while the smaller lesion is more typical of a ductal lesion with linear arrays (E-cadherin performed on biopsy). There are foci within the larger lesion that are concerning for lymphovascular space invasion.     04/07/2017 -  Chemotherapy    weekly taxol and herceptin x12 weeks, 04/07/17-06/23/17. Then herceptin q3 weeks for total 1 year which was switched to Kadcyla on 07/14/17.    07/20/2017 - 09/03/2017 Radiation Therapy    She started adjuvant radiation on 07/20/17 with Dr. Lisbeth Renshaw and plans to complete on 09/03/17.     08/24/2017 -  Chemotherapy    maintenance Herceptin    CURRENT THERAPY:  -weekly taxol and herceptin x12 weeks, 04/07/17-06/23/17. Then herceptin q3 weeks for total 1 year which was switched to Kadcyla on 07/14/17, and switchedback to maintenance Herceptin on September 02, 2017 due toworsening peripheral neuropathy. -anastrozole daily   INTERVAL HISTORY: Ms. Madaris returns for follow up and next cycle as scheduled. She completed last herceptin on 11/24/17. She feels "fine" other than persistent neuropathy. She was seen by Dr. Mickeal Skinner since last f/u and thinks her neuropathy has improved slightly. She was not aware to begin lidocain patches but plans to pick up from the pharmacy today. She continues to struggle with certain functions, such as buttoning clothes. She has some difficulty with word-finding. Has mild hot flash daily, but manageable.  She continues anastrozole. She has gained some weight lately. Appetite is good. Energy level fluctuates. Manages constipation with diet and PRN stool softener. No n/v. Denies fever, chills, cough, chest pain, dyspnea, or changes in her breasts.    MEDICAL HISTORY:  Past Medical History:  Diagnosis Date  . Arthritis    "maybe a little in my left knee" (02/23/2017)  . Asthmatic bronchitis   . Cancer of left breast ([place])   . Coronary atherosclerosis    Minor nonobstructive CAD at cardiac catheterization 2009  . Dyslipidemia   . Femoral bruit 7/08   With normal ABIs  . Hypertension   . Lower extremity edema    Chronic  . Pneumonia ~ 1950   in 8th grade  . Type II diabetes mellitus ([place])     SURGICAL HISTORY: Past Surgical History:  Procedure Laterality Date  . APPENDECTOMY    . BREAST BIOPSY Left 02/2017  . CARDIAC CATHETERIZATION     "long time ago" (02/23/2017)  . MASTECTOMY COMPLETE / SIMPLE W/ SENTINEL NODE BIOPSY Left 02/23/2017   TOTAL MATECTOMY;  LEFT AXILLARY SENTINEL LYMPH NODE BIOPSY ERAS PATHWAY  . MASTECTOMY WITH RADIOACTIVE SEED GUIDED EXCISION AND AXILLARY SENTINEL LYMPH NODE BIOPSY Left 02/23/2017   Procedure: LEFT TOTAL MATECTOMY;  LEFT AXILLARY SENTINEL LYMPH NODE BIOPSY ERAS PATHWAY;  Surgeon: Excell Seltzer, MD;  Location: [facility];  Service: General;  Laterality: Left;  . PILONIDAL CYST EXCISION    . PORTA CATH INSERTION  02/23/2017  . PORTACATH PLACEMENT N/A 02/23/2017   Procedure: INSERTION PORT-A-CATH;  Surgeon: Excell Seltzer, MD;  Location: [place] [place];  Service: General;  Laterality: N/A;  . SHOULDER ARTHROSCOPY W/ ROTATOR CUFF REPAIR Right     I have reviewed the social history and family history with the patient and they are unchanged from previous note.  ALLERGIES:  has No Known Allergies.  MEDICATIONS:  Current Outpatient Medications  Medication Sig Dispense Refill  . amitriptyline (ELAVIL) 50 MG tablet Take 1 tablet (50 mg total) by mouth at  bedtime. 30 tablet 3  . amLODipine (NORVASC) 5 MG tablet TAKE 1 TABLET BY MOUTH ONCE DAILY 90 tablet 0  . anastrozole (ARIMIDEX) 1 MG tablet TAKE 1 TABLET BY MOUTH ONCE DAILY 30 tablet 1  . aspirin 81 MG EC tablet Take 81 mg by mouth at bedtime.     . chlorthalidone (HYGROTON) 25 MG tablet Take 25 mg by mouth daily.     . Coenzyme Q10 (COQ10) 100 MG CAPS Take 100 mg by mouth daily.      Marland Kitchen latanoprost (XALATAN) 0.005 % ophthalmic solution Place 1 drop into both eyes at bedtime.    . Lidocaine 3.5 % PTCH Apply 1 patch topically daily as needed. To each foot as needed for pain 60 patch 3  . lidocaine-prilocaine (EMLA) cream APPLY CREAM TOPICALLY TO AFFECTED AREA ONCE  3  . losartan (COZAAR) 50 MG tablet Take 50 mg by mouth daily.    . metFORMIN (GLUCOPHAGE) 500 MG tablet Take 1,000 mg by mouth daily with breakfast.     . pregabalin (LYRICA) 75 MG capsule Take 2 capsules (150 mg total) by mouth 2 (two) times daily. For first week of treatment, take 66m two times daily 120 capsule 1  . vitamin B-12 (CYANOCOBALAMIN) 1000 MCG tablet Take 1,000 mcg by mouth daily.     No current facility-administered medications for this visit.     PHYSICAL EXAMINATION: ECOG PERFORMANCE STATUS: 1 - Symptomatic but completely ambulatory  Vitals:   12/15/17 1132 12/15/17 1142  BP: (!) 182/84 (!) 178/80  Pulse:    Resp:    Temp:    SpO2:     Filed Weights   12/15/17 1130  Weight: 167 lb (75.8 kg)    GENERAL:alert, no distress and comfortable SKIN: no rashes or significant lesions EYES: sclera clear OROPHARYNX:no thrush or ulcers  LYMPH:  no palpable cervical, supraclavicular, or axillary lymphadenopathy  LUNGS: clear to auscultation with normal breathing effort HEART: regular rate & rhythm, no lower extremity edema ABDOMEN:abdomen soft, non-tender and normal bowel sounds Musculoskeletal:no cyanosis of digits and no clubbing  NEURO: alert & oriented x 3 with fluent speech, decreased peripheral  vibratory sense BREAST: s/p left mastectomy, incision is well healed without mass. Persistent diffuse moderate  hyperpigmentation to left chest wall. No palpable adenopathy or mass in right breast that I could appreciate  PAC without erythema   LABORATORY DATA:  I have reviewed the data as listed CBC Latest Ref Rng & Units 12/15/2017 11/24/2017 11/03/2017  WBC 4.0 - 10.5 K/uL 3.2(L) 4.3 4.2  Hemoglobin 12.0 - 15.0 g/dL 11.2(L) 10.9(L) 10.9(L)  Hematocrit 36.0 - 46.0 % 35.4(L) 34.5(L) 33.9(L)  Platelets 150 - 400 K/uL 261 239 211     CMP Latest Ref Rng & Units 12/15/2017 11/24/2017 11/03/2017  Glucose 70 - 99 mg/dL 74 136(H) 158(H)  BUN 8 - 23 mg/dL _0 Creatinine 0.44 - 1.00 mg/dL 0.76 0.74 0.75  Sodium 135 - 145 mmol/L 131(L) 133(L) 131(L)  Potassium 3.5 - 5.1 mmol/L 3.8 3.9 3.7  Chloride 98 - 111 mmol/L 96(L) 97(L) 95(L)  CO2 22 - 32 mmol/L _1 Calcium 8.9 - 10.3 mg/dL 9.5 9.6 9.5  Total Protein 6.5 - 8.1 g/dL 7.0 6.8 6.7  Total Bilirubin 0.3 - 1.2 mg/dL 0.6  0.6 0.7  Alkaline Phos 38 - 126 U/L 71 69 65  AST 15 - 41 U/L _0 ALT 0 - 44 U/L _1 RADIOGRAPHIC STUDIES: I have personally reviewed the radiological images as listed and agreed with the findings in the report. No results found.   ASSESSMENT & PLAN: Shadonna Benedick is an28 y.o.female with past medical history positive for HTN, DM, CAD now with newly diagnosed left breast cancer  1.Multifocal invasive ductal carcinoma of the left breast of female;carcinoma of the upper-outer quadrant of left breast and female, ER/PR positive, HER-2 negative pT2N0M0, G2, Stage 1B; IDC of the upper-inner quadrant left breast, ER+/PR+/HER2+, pT3N0M0 G3, stage IB 2. HTN, CAD, DM -07/08/17 echo with EF 55% -10/27/17 EF 55-60% 3. Hyponatremia, hypokalemia 4. Peripheral neuropathy, G2-3, secondary to taxol, DM and T-DM1  Ms. Hamblin appears stable. She completed another cycle of maintenance herceptin q3 weeks.  She continues to tolerate well, except persistent neuropathy. She is under the care of Dr. Mickeal Skinner for this, with ongoing medical therapy. She will soon begin lidocain patches to see if this helps. She continues anastrozole which she tolerates well with mild hot flash.   Her last echo in 10/2017 is WNL, EF 55-60%. Next echo due 02/2018. Anticipate to complete herceptin in March 2020. She will be due annual right mammogram in 01/2018, I ordered today.   PLAN: -Labs reviewed, proceed with next cycle herceptin, continue q3 weeks until March 2020 -Next echo 02/2018 -R mammo due 01/2018  -Continue f/u with Dr. Mickeal Skinner and cardiology  -continue treatment q3 weeks, f/u in 6 weeks   Orders Placed This Encounter  Procedures  . MM Digital Screening Unilat R    Standing Status:   Future    Standing Expiration Date:   12/16/2018    Order Specific Question:   Reason for Exam (SYMPTOM  OR DIAGNOSIS REQUIRED)    Answer:   h/o left breast cancer s/p chemo, mastectomy, and radiation currently on herceptin and AI    Order Specific Question:   Preferred imaging location?    Answer:   [facility]   All questions were answered. The patient knows to call the clinic with any problems, questions or concerns. No barriers to learning was detected.    Alla Feeling, NP 12/16/17

## 2017-12-15 NOTE — Patient Instructions (Signed)
[facility] Discharge Instructions for Patients Receiving Chemotherapy  Today you received the following chemotherapy agents Herceptin  To help prevent nausea and vomiting after your treatment, we encourage you to take your nausea medication as directed   If you develop nausea and vomiting that is not controlled by your nausea medication, call the clinic.   BELOW ARE SYMPTOMS THAT SHOULD BE REPORTED IMMEDIATELY:  *FEVER GREATER THAN 100.5 F  *CHILLS WITH OR WITHOUT FEVER  NAUSEA AND VOMITING THAT IS NOT CONTROLLED WITH YOUR NAUSEA MEDICATION  *UNUSUAL SHORTNESS OF BREATH  *UNUSUAL BRUISING OR BLEEDING  TENDERNESS IN MOUTH AND THROAT WITH OR WITHOUT PRESENCE OF ULCERS  *URINARY PROBLEMS  *BOWEL PROBLEMS  UNUSUAL RASH Items with * indicate a potential emergency and should be followed up as soon as possible.  Feel free to call the clinic should you have any questions or concerns. The clinic phone number is [phone].  Please show the CHEMO ALERT CARD at check-in to the Emergency Department and triage nurse.   

## 2017-12-15 NOTE — Patient Instructions (Signed)
Implanted Port Home Guide An implanted port is a type of central line that is placed under the skin. Central lines are used to provide IV access when treatment or nutrition needs to be given through a person's veins. Implanted ports are used for long-term IV access. An implanted port may be placed because:  You need IV medicine that would be irritating to the small veins in your hands or arms.  You need long-term IV medicines, such as antibiotics.  You need IV nutrition for a long period.  You need frequent blood draws for lab tests.  You need dialysis.  Implanted ports are usually placed in the chest area, but they can also be placed in the upper arm, the abdomen, or the leg. An implanted port has two main parts:  Reservoir. The reservoir is round and will appear as a small, raised area under your skin. The reservoir is the part where a needle is inserted to give medicines or draw blood.  Catheter. The catheter is a thin, flexible tube that extends from the reservoir. The catheter is placed into a large vein. Medicine that is inserted into the reservoir goes into the catheter and then into the vein.  How will I care for my incision site? Do not get the incision site wet. Bathe or shower as directed by your health care provider. How is my port accessed? Special steps must be taken to access the port:  Before the port is accessed, a numbing cream can be placed on the skin. This helps numb the skin over the port site.  Your health care provider uses a sterile technique to access the port. ? Your health care provider must put on a mask and sterile gloves. ? The skin over your port is cleaned carefully with an antiseptic and allowed to dry. ? The port is gently pinched between sterile gloves, and a needle is inserted into the port.  Only "non-coring" port needles should be used to access the port. Once the port is accessed, a blood return should be checked. This helps ensure that the port  is in the vein and is not clogged.  If your port needs to remain accessed for a constant infusion, a clear (transparent) bandage will be placed over the needle site. The bandage and needle will need to be changed every week, or as directed by your health care provider.  Keep the bandage covering the needle clean and dry. Do not get it wet. Follow your health care provider's instructions on how to take a shower or bath while the port is accessed.  If your port does not need to stay accessed, no bandage is needed over the port.  What is flushing? Flushing helps keep the port from getting clogged. Follow your health care provider's instructions on how and when to flush the port. Ports are usually flushed with saline solution or a medicine called heparin. The need for flushing will depend on how the port is used.  If the port is used for intermittent medicines or blood draws, the port will need to be flushed: ? After medicines have been given. ? After blood has been drawn. ? As part of routine maintenance.  If a constant infusion is running, the port may not need to be flushed.  How long will my port stay implanted? The port can stay in for as long as your health care provider thinks it is needed. When it is time for the port to come out, surgery will be   done to remove it. The procedure is similar to the one performed when the port was put in. When should I seek immediate medical care? When you have an implanted port, you should seek immediate medical care if:  You notice a bad smell coming from the incision site.  You have swelling, redness, or drainage at the incision site.  You have more swelling or pain at the port site or the surrounding area.  You have a fever that is not controlled with medicine.  This information is not intended to replace advice given to you by your health care provider. Make sure you discuss any questions you have with your health care provider. Document  Released: 01/19/2005 Document Revised: 06/27/2015 Document Reviewed: 09/26/2012 Elsevier Interactive Patient Education  2017 Elsevier Inc.  

## 2017-12-16 ENCOUNTER — Telehealth: Payer: Self-pay | Admitting: Nurse Practitioner

## 2017-12-16 ENCOUNTER — Encounter: Payer: Self-pay | Admitting: Nurse Practitioner

## 2017-12-16 NOTE — Telephone Encounter (Signed)
Spoke with patient and verified her appointments for Jan 27, 2018

## 2017-12-20 DIAGNOSIS — H25812 Combined forms of age-related cataract, left eye: Secondary | ICD-10-CM | POA: Diagnosis not present

## 2017-12-20 DIAGNOSIS — H25012 Cortical age-related cataract, left eye: Secondary | ICD-10-CM | POA: Diagnosis not present

## 2017-12-20 DIAGNOSIS — H21562 Pupillary abnormality, left eye: Secondary | ICD-10-CM | POA: Diagnosis not present

## 2017-12-20 DIAGNOSIS — H5703 Miosis: Secondary | ICD-10-CM | POA: Diagnosis not present

## 2017-12-22 ENCOUNTER — Other Ambulatory Visit: Payer: Self-pay

## 2017-12-22 ENCOUNTER — Telehealth: Payer: Self-pay

## 2017-12-22 MED ORDER — LIDOCAINE 3.5 % EX PTCH: 1.0000 | MEDICATED_PATCH | Freq: Every day | CUTANEOUS | 3 refills | Status: DC | PRN

## 2017-12-22 NOTE — Telephone Encounter (Signed)
Patient called and left a message stating that the pharmacy has not yet received the prescription for lidocaine patches. Followed up with [facility] and refaxed prescription. Spoke with pharmacist that stated the prescription cannot be filled because it is an OTC medication. Contacted the patient and left a message that the prescription cannot be filled but can be purchased OTC and to call back with any questions or concerns.

## 2017-12-24 ENCOUNTER — Inpatient Hospital Stay (HOSPITAL_BASED_OUTPATIENT_CLINIC_OR_DEPARTMENT_OTHER): Payer: Medicare Other | Admitting: Internal Medicine

## 2017-12-24 ENCOUNTER — Encounter: Payer: Self-pay | Admitting: Internal Medicine

## 2017-12-24 VITALS — BP 182/87 | HR 80 | Temp 98.1°F | Resp 18 | Ht 64.0 in | Wt 172.1 lb

## 2017-12-24 DIAGNOSIS — Z9012 Acquired absence of left breast and nipple: Secondary | ICD-10-CM

## 2017-12-24 DIAGNOSIS — E876 Hypokalemia: Secondary | ICD-10-CM | POA: Diagnosis not present

## 2017-12-24 DIAGNOSIS — Z9221 Personal history of antineoplastic chemotherapy: Secondary | ICD-10-CM

## 2017-12-24 DIAGNOSIS — Z923 Personal history of irradiation: Secondary | ICD-10-CM

## 2017-12-24 DIAGNOSIS — T451X5A Adverse effect of antineoplastic and immunosuppressive drugs, initial encounter: Principal | ICD-10-CM

## 2017-12-24 DIAGNOSIS — C50412 Malignant neoplasm of upper-outer quadrant of left female breast: Secondary | ICD-10-CM

## 2017-12-24 DIAGNOSIS — C50212 Malignant neoplasm of upper-inner quadrant of left female breast: Secondary | ICD-10-CM

## 2017-12-24 DIAGNOSIS — Z79899 Other long term (current) drug therapy: Secondary | ICD-10-CM

## 2017-12-24 DIAGNOSIS — Z5112 Encounter for antineoplastic immunotherapy: Secondary | ICD-10-CM | POA: Diagnosis not present

## 2017-12-24 DIAGNOSIS — E871 Hypo-osmolality and hyponatremia: Secondary | ICD-10-CM

## 2017-12-24 DIAGNOSIS — Z170 Estrogen receptor positive status [ER+]: Secondary | ICD-10-CM | POA: Diagnosis not present

## 2017-12-24 DIAGNOSIS — T451X5S Adverse effect of antineoplastic and immunosuppressive drugs, sequela: Secondary | ICD-10-CM

## 2017-12-24 DIAGNOSIS — G620 Drug-induced polyneuropathy: Secondary | ICD-10-CM

## 2017-12-24 NOTE — Progress Notes (Signed)
[facility] [place],  [address] [phone]   Interval Evaluation  Date of Service: 12/24/17 Patient Name: Penny Huynh Patient MRN: [id number] Patient DOB: 1934-01-08 Provider: Ventura Sellers, MD  Identifying Statement:  Penny Huynh is a 84 y.o. female with Chemotherapy-induced peripheral neuropathy ([place]) [G62.0, T45.1X5A]   Oncologic History: Oncology History   Cancer Staging Carcinoma of upper-inner quadrant of left breast in female, estrogen receptor positive ([place]) Staging form: Breast, AJCC 8th Edition - Clinical stage from 01/18/2017: Stage IB (cT2, cN0, cM0, G3, ER: Positive, PR: Positive, HER2: Positive) - Signed by Truitt Merle, MD on 02/10/2017 - Pathologic stage from 02/23/2017: Stage IB (pT3, pN0, cM0, G3, ER: Positive, PR: Positive, HER2: Positive) - Signed by Alla Feeling, NP on 03/16/2017  Carcinoma of upper-outer quadrant of left breast in female, estrogen receptor positive ([place]) Staging form: Breast, AJCC 8th Edition - Clinical stage from 01/18/2017: Stage IB (cT2, cN0, cM0, G2, ER: Positive, PR: Positive, HER2: Negative) - Unsigned - Pathologic stage from 02/23/2017: Stage IB (pT2, pN0(sn), cM0, G3, ER: Positive, PR: Positive, HER2: Negative) - Signed by Alla Feeling, NP on 03/16/2017       Carcinoma of upper-outer quadrant of left breast in female, estrogen receptor positive ([place])   01/13/2017 Mammogram    FINDINGS: In the left breast, a spiculated mass lies in the upper outer quadrant. In the medial posterior left breast, there is a larger lobulated mass. These masses correspond to the palpable abnormalities. No other discrete left breast masses.  In the right breast, there are no discrete masses. There are no areas of architectural distortion.  In both breasts there are scattered calcifications, rounded punctate, with a few other larger coarse dystrophic calcifications, without  significant change from the previous screening mammogram, which is dated 12/04/2009.  Mammographic images were processed with CAD.  On physical exam, there is a firm palpable mass in the upper outer left breast and another firm mass in the medial left breast.     01/13/2017 Breast US    Targeted ultrasound is performed, showing an irregular hypoechoic shadowing mass in the left breast at the 2:30 o'clock position, 5 cm the nipple, measuring 3.3 x 2.8 x 2.6 cm. In the 10:30 o'clock position of the left breast, 5 cm the nipple, there is irregular hypoechoic mass with somewhat more lobulated margins, corresponding to the lobulated mass seen mammographically. This measures 4.8 x 2.9 x 4.8 cm. Both masses show internal vascularity on color Doppler analysis.  Sonographic evaluation of the left axilla shows several nodes with thickened cortices. Status cortex measured is 5 mm. None of these lymph nodes appear enlarged but overall size criteria.  IMPRESSION: 1. Two masses in the left breast, 1 at the 2:30 o'clock position in the other at the 10:30 o'clock position, both highly suspicious for breast carcinoma. 2. Several abnormal left axillary lymph nodes with thickened cortices. 3. No evidence of right breast malignancy.     01/18/2017 Pathology Results    Diagnosis 1. Breast, left, needle core biopsy, 2:30 o'clock - INVASIVE MAMMARY CARCINOMA - MAMMARY CARCINOMA IN-SITU - SEE COMMENT 2. Lymph node, needle/core biopsy, left axilla - NO CARCINOMA IDENTIFIED - SEE COMMENT 3. Breast, left, needle core biopsy, 10:30 o'clock - INVASIVE DUCTAL CARCINOMA - SEE COMMENT  1. PROGNOSTIC INDICATORS Results: IMMUNOHISTOCHEMICAL AND MORPHOMETRIC ANALYSIS PERFORMED MANUALLY Estrogen Receptor: 100%, POSITIVE, STRONG STAINING INTENSITY Progesterone Receptor: 60%, POSITIVE, STRONG STAINING INTENSITY Proliferation  Marker Ki67: 60%  1. FLUORESCENCE IN-SITU HYBRIDIZATION Results: HER2  - NEGATIVE RATIO OF HER2/CEP17 SIGNALS 1.28 AVERAGE HER2 COPY NUMBER PER CELL 2.75  3. PROGNOSTIC INDICATORS Results: IMMUNOHISTOCHEMICAL AND MORPHOMETRIC ANALYSIS PERFORMED MANUALLY Estrogen Receptor: 100%, POSITIVE, STRONG STAINING INTENSITY Progesterone Receptor: 50%, POSITIVE, STRONG STAINING INTENSITY Proliferation Marker Ki67: 70%  3. FLUORESCENCE IN-SITU HYBRIDIZATION Results: HER2 - **POSITIVE** RATIO OF HER2/CEP17 SIGNALS 2.80 AVERAGE HER2 COPY NUMBER PER CELL 6.15  Microscopic Comment 1. The biopsy material shows an infiltrative proliferation of cells with large vesicular nuclei with inconspicuous nucleoli, arranged linearly and in small clusters. Based on the biopsy, the carcinoma appears Nottingham grade 2 of 3 and measures 0.9 cm in greatest linear extent. E-cadherin and prognostic markers (ER/PR/ki-67/HER2-FISH)are pending and will be reported in an addendum. Dr. Lyndon Code reviewed the case and agrees with the above diagnosis.    02/10/2017 Initial Diagnosis    Carcinoma of upper-outer quadrant of left breast in female, estrogen receptor positive ([place])    02/19/2017 Imaging    Bone Scan whole Body 02/19/17 IMPRESSION: Negative for evidence of bony metastatic disease.    02/19/2017 Imaging    CT CAP W Contrast 02/19/17 IMPRESSION: 1. Small right middle lobe pulmonary nodules up to 0.8 cm, nonspecific. Non-contrast chest CT at 3-6 months is recommended. If the nodules are stable at time of repeat CT, then future CT at 18-24 months (from today's scan) is considered optional for low-risk patients, but is recommended for high-risk patients. This recommendation follows the consensus statement: Guidelines for Management of Incidental Pulmonary Nodules Detected on CT Images: From the Fleischner Society 2017; Radiology 2017; 284:228-243. 2. Subcentimeter hepatic lesions likely cysts but incompletely characterized due to small size. Attention on follow-up imaging recommended. 3.  2.1 cm fusiform right common iliac artery aneurysm. Continued surveillance recommended. 4. Aortic Atherosclerosis (ICD10-I70.0) and Emphysema (ICD10-J43.9).    02/23/2017 Pathology Results    Diagnosis 1. Breast, simple mastectomy, Left Total - INVASIVE DUCTAL CARCINOMA, MULTIFOCAL, NOTTINGHAM GRADE 3/3 (5.3 CM, 3.5 CM) - INVASIVE CARCINOMA INVOLVES THE DERMIS - DUCTAL CARCINOMA IN SITU, INTERMEDIATE GRADE - HYALINIZED FIBROADENOMA - MARGINS UNINVOLVED BY CARCINOMA - NO CARCINOMA IDENTIFIED IN TWO LYMPH NODES (0/2) - SEE ONCOLOGY TABLE AND COMMENT BELOW 2. Lymph node, sentinel, biopsy, Left Axillary - NO CARCINOMA IDENTIFIED IN ONE LYMPH NODE (0/1) 3. Lymph node, sentinel, biopsy - NO CARCINOMA IDENTIFIED IN ONE LYMPH NODE (0/1) 4. Lymph node, sentinel, biopsy - NO CARCINOMA IDENTIFIED IN ONE LYMPH NODE (0/1) 5. Lymph node, sentinel, biopsy - NO CARCINOMA IDENTIFIED IN ONE LYMPH NODE (0/1) Microscopic Comment 1. BREAST, INVASIVE TUMOR Procedure: Simple mastectomy Laterality: Left Tumor Size: 5. 3 cm, 3.5 cm Histologic Type: Invasive carcinoma of no special type (ductal, not otherwise specified) Grade: Nottingham Grade 3 Tubular Differentiation: 3 Nuclear Pleomorphism: 3 Mitotic Count: 2 Ductal Carcinoma in Situ (DCIS): Present Extent of Tumor: Skin: Invasive carcinoma directly invades into the dermis or epidermis without skin ulceration Margins: Invasive carcinoma, distance from closest margin: 1.5 cm (posterior) DCIS, distance from closest margin: > 1 cm Regional Lymph Nodes: Number of Lymph Nodes Examined: 6 Number of Sentinel Lymph Nodes Examined: 4 Lymph Nodes with Macrometastases: 0 Lymph Nodes with Micrometastases: 0 Lymph Nodes with Isolated Tumor Cells: 0 Treatment effect: No known presurgical therapy Breast Prognostic Profile: See Also ([id number]) Estrogen Receptor: Positive (100%, strong); Positive (100%, strong) Progesterone Receptor: Positive (50%,  strong); Positive (50%, strong) Her2: Positive (Ratio 2.80); Negative (Ratio 1.28) Ki-67: 70%; 60% Best tumor block for sendout testing:  1E Pathologic Stage Classification (pTNM, AJCC 8th Edition): Primary Tumor: mpT3 Regional Lymph Nodes: pN0 COMMENT: The two invasive carcinomas in the breast have slightly different morphologies. The larger lesion has a papillary and micropapillary pattern admixed with typical ductal carcinoma while the smaller lesion is more typical of a ductal lesion with linear arrays (E-cadherin performed on biopsy). There are foci within the larger lesion that are concerning for lymphovascular space invasion.     04/07/2017 -  Chemotherapy    weekly taxol and herceptin x12 weeks, 04/07/17-06/23/17. Then herceptin q3 weeks for total 1 year which was switched to Kadcyla on 07/14/17.    07/20/2017 - 09/03/2017 Radiation Therapy    She started adjuvant radiation on 07/20/17 with Dr. Lisbeth Renshaw and plans to complete on 09/03/17.     08/24/2017 -  Chemotherapy    maintenance Herceptin      Interval History:  Penny Huynh presents today for neuropathy follow up.  She describes some interval improvement until several days ago, when pain in feet worsened after "tying shoes too tightly".  Still feels somewhat off balance as well.  Continues to take Elavil and Lyrica.  No issues with sleep  H+P: presents today to discuss her neuropathy symptoms.  She describes painful burning affecting her feet and lower to mid-shins symmetrically.  This started sometime after taxol chemotherapy was started in March (continued through May) and has persisted or even progressed.  At this time she also describes feeling of numbness and tingling affecting her finger tips on both hands.  Her walking is impaired because of poor sensation and balance, she is requiring a walker fora ambulation.  There is also noted swelling of both feet.  She does acknowledge a milder version of these symptoms, affecting the bottoms  of her feet only, which were present prior to the onset of chemotherapy.  She has long-standing diagnosis of diabetes.  Presently she is receiving Herceptin and no further cytotoxic chemotherapy.  Taking Gabapentin 662m 3x day without efficacy.  Medications: Current Outpatient Medications on File Prior to Visit  Medication Sig Dispense Refill  . amitriptyline (ELAVIL) 50 MG tablet Take 1 tablet (50 mg total) by mouth at bedtime. 30 tablet 3  . amLODipine (NORVASC) 5 MG tablet TAKE 1 TABLET BY MOUTH ONCE DAILY 90 tablet 0  . anastrozole (ARIMIDEX) 1 MG tablet TAKE 1 TABLET BY MOUTH ONCE DAILY 30 tablet 1  . aspirin 81 MG EC tablet Take 81 mg by mouth at bedtime.     . chlorthalidone (HYGROTON) 25 MG tablet Take 25 mg by mouth daily.     . Coenzyme Q10 (COQ10) 100 MG CAPS Take 100 mg by mouth daily.      .Marland Kitchenlatanoprost (XALATAN) 0.005 % ophthalmic solution Place 1 drop into both eyes at bedtime.    . lidocaine-prilocaine (EMLA) cream APPLY CREAM TOPICALLY TO AFFECTED AREA ONCE  3  . losartan (COZAAR) 50 MG tablet Take 50 mg by mouth daily.    . metFORMIN (GLUCOPHAGE) 500 MG tablet Take 1,000 mg by mouth daily with breakfast.     . pregabalin (LYRICA) 75 MG capsule Take 2 capsules (150 mg total) by mouth 2 (two) times daily. For first week of treatment, take 749mtwo times daily 120 capsule 1  . vitamin B-12 (CYANOCOBALAMIN) 1000 MCG tablet Take 1,000 mcg by mouth daily.    . Lidocaine 3.5 % PTCH Apply 1 patch topically daily as needed. To each foot as needed for pain (Patient not taking: Reported  on 12/24/2017) 60 patch 3  . [DISCONTINUED] prochlorperazine (COMPAZINE) 10 MG tablet Take 1 tablet (10 mg total) by mouth every 6 (six) hours as needed (Nausea or vomiting). 30 tablet 1   No current facility-administered medications on file prior to visit.     Allergies: No Known Allergies Past Medical History:  Past Medical History:  Diagnosis Date  . Arthritis    "maybe a little in my left knee"  (02/23/2017)  . Asthmatic bronchitis   . Cancer of left breast ([place])   . Coronary atherosclerosis    Minor nonobstructive CAD at cardiac catheterization 2009  . Dyslipidemia   . Femoral bruit 7/08   With normal ABIs  . Hypertension   . Lower extremity edema    Chronic  . Pneumonia ~ 1950   in 8th grade  . Type II diabetes mellitus ([place])    Past Surgical History:  Past Surgical History:  Procedure Laterality Date  . APPENDECTOMY    . BREAST BIOPSY Left 02/2017  . CARDIAC CATHETERIZATION     "long time ago" (02/23/2017)  . MASTECTOMY COMPLETE / SIMPLE W/ SENTINEL NODE BIOPSY Left 02/23/2017   TOTAL MATECTOMY;  LEFT AXILLARY SENTINEL LYMPH NODE BIOPSY ERAS PATHWAY  . MASTECTOMY WITH RADIOACTIVE SEED GUIDED EXCISION AND AXILLARY SENTINEL LYMPH NODE BIOPSY Left 02/23/2017   Procedure: LEFT TOTAL MATECTOMY;  LEFT AXILLARY SENTINEL LYMPH NODE BIOPSY ERAS PATHWAY;  Surgeon: Excell Seltzer, MD;  Location: [facility];  Service: General;  Laterality: Left;  . PILONIDAL CYST EXCISION    . PORTA CATH INSERTION  02/23/2017  . PORTACATH PLACEMENT N/A 02/23/2017   Procedure: INSERTION PORT-A-CATH;  Surgeon: Excell Seltzer, MD;  Location: [facility];  Service: General;  Laterality: N/A;  . SHOULDER ARTHROSCOPY W/ ROTATOR CUFF REPAIR Right    Social History:  Social History   Socioeconomic History  . Marital status: Married    Spouse name: Not on file  . Number of children: 2  . Years of education: Not on file  . Highest education level: Not on file  Occupational History  . Occupation: Counselling psychologist for [employer]    Comment: only works during tax season but not currently   . Occupation: in Dentist   . Occupation: retired Mining engineer of education     Comment: [employer]  . Financial resource strain: Not on file  . Food insecurity:    Worry: Not on file    Inability: Not on file  . Transportation needs:    Medical: Not on file    Non-medical: Not on file  Tobacco Use  . Smoking status:  Former Smoker    Packs/day: 2.00    Years: 46.00    Pack years: 92.00    Types: Cigarettes    Start date: 11/09/1953    Last attempt to quit: 2000    Years since quitting: 19.9  . Smokeless tobacco: Never Used  Substance and Sexual Activity  . Alcohol use: Yes    Alcohol/week: 2.0 standard drinks    Types: 2 Glasses of wine per week  . Drug use: No  . Sexual activity: Not on file  Lifestyle  . Physical activity:    Days per week: Not on file    Minutes per session: Not on file  . Stress: Not on file  Relationships  . Social connections:    Talks on phone: Not on file    Gets together: Not on file    Attends religious service: Not  on file    Active member of club or organization: Not on file    Attends meetings of clubs or organizations: Not on file    Relationship status: Not on file  . Intimate partner violence:    Fear of current or ex partner: Not on file    Emotionally abused: Not on file    Physically abused: Not on file    Forced sexual activity: Not on file  Other Topics Concern  . Not on file  Social History Narrative   Lives in [place] with her husband.   Has 2 grown children.   10-25-17 Unable to ask abuse questions husband with her today.   Family History:  Family History  Problem Relation Age of Onset  . Diabetes Mother   . Stroke Father   . Cancer Father        unknown type     Review of Systems: Constitutional: Denies fevers, chills or abnormal weight loss Eyes: Denies blurriness of vision Ears, nose, mouth, throat, and face: Denies mucositis or sore throat Respiratory: Denies cough, dyspnea or wheezes Cardiovascular: Denies palpitation, chest discomfort or lower extremity swelling Gastrointestinal:  Denies nausea, constipation, diarrhea GU: Denies dysuria or incontinence Skin: Denies abnormal skin rashes Neurological: Per HPI Musculoskeletal: +knee pain Behavioral/Psych: Denies anxiety, disturbance in thought content, and mood  instability   Physical Exam: Vitals:   12/24/17 1220  BP: (!) 182/87  Pulse: 80  Resp: 18  Temp: 98.1 F (36.7 C)  SpO2: 100%   KPS: 60. General: Alert, cooperative, pleasant, in no acute distress Head: Craniotomy scar noted, dry and intact. EENT: No conjunctival injection or scleral icterus. Oral mucosa moist Lungs: Resp effort normal Cardiac: Regular rate and rhythm Abdomen: Soft, non-distended abdomen Skin: No rashes cyanosis or petechiae. Extremities: No clubbing or edema  Neurologic Exam: Mental Status: Awake, alert, attentive to examiner. Oriented to self and environment. Language is fluent with intact comprehension.  Cranial Nerves: Visual acuity is grossly normal. Visual fields are full. Extra-ocular movements intact. No ptosis. Face is symmetric, tongue midline. Motor: Tone and bulk are normal. Power is full in both arms and legs. Reflexes are decreased bilaterally, no pathologic reflexes present. Intact finger to nose bilaterally Sensory: Impaired to light touch and temp lower legs and fingertips Gait: Deferred   Labs: I have reviewed the data as listed    Component Value Date/Time   NA 131 (L) 12/15/2017 1100   K 3.8 12/15/2017 1100   CL 96 (L) 12/15/2017 1100   CO2 28 12/15/2017 1100   GLUCOSE 74 12/15/2017 1100   BUN 10 12/15/2017 1100   CREATININE 0.76 12/15/2017 1100   CALCIUM 9.5 12/15/2017 1100   PROT 7.0 12/15/2017 1100   ALBUMIN 3.7 12/15/2017 1100   AST 21 12/15/2017 1100   ALT 13 12/15/2017 1100   ALKPHOS 71 12/15/2017 1100   BILITOT 0.6 12/15/2017 1100   GFRNONAA >60 12/15/2017 1100   GFRAA >60 12/15/2017 1100   Lab Results  Component Value Date   WBC 3.2 (L) 12/15/2017   NEUTROABS 1.6 (L) 12/15/2017   HGB 11.2 (L) 12/15/2017   HCT 35.4 (L) 12/15/2017   MCV 90.8 12/15/2017   PLT 261 12/15/2017      Assessment/Plan 1. Chemotherapy-induced peripheral neuropathy [facility])  Ms. Linzy has a symmetric distal polyneuropathy which is  secondary to taxol exposure combined with likely pre-existing diabetic neuropathy.  There is evidence of both small and large fiber involvement.    At this time she  has failed multiple neuropathic pain drugs.  She plans to obtain 3.5% Lidocaine patches today through her pharmacy.  In the meantime she should continue Lyrica 178m BID, but we have asked her to discontinue Elavil due to lack of efficacy.    Also to be considered are capsaicin patches and tramadol.  We appreciate the opportunity to participate in the care of MAnsel Bong  We ask that she return in 1-2 months for further evaluation.  All questions were answered. The patient knows to call the clinic with any problems, questions or concerns. No barriers to learning were detected.  The total time spent in the encounter was 25 minutes and more than 50% was on counseling and review of test results   ZVentura Sellers MD Medical Director of Neuro-Oncology [facility] [place]11/22/19 12:26 PM

## 2017-12-27 ENCOUNTER — Telehealth: Payer: Self-pay

## 2017-12-27 NOTE — Telephone Encounter (Signed)
Per 10/22 no los 

## 2018-01-04 DIAGNOSIS — H2511 Age-related nuclear cataract, right eye: Secondary | ICD-10-CM | POA: Diagnosis not present

## 2018-01-05 ENCOUNTER — Telehealth: Payer: Self-pay | Admitting: Nurse Practitioner

## 2018-01-05 ENCOUNTER — Inpatient Hospital Stay: Payer: Medicare Other

## 2018-01-05 ENCOUNTER — Inpatient Hospital Stay (HOSPITAL_BASED_OUTPATIENT_CLINIC_OR_DEPARTMENT_OTHER): Payer: Medicare Other | Admitting: Nurse Practitioner

## 2018-01-05 ENCOUNTER — Inpatient Hospital Stay: Payer: Medicare Other | Attending: Hematology

## 2018-01-05 ENCOUNTER — Encounter: Payer: Self-pay | Admitting: Nurse Practitioner

## 2018-01-05 VITALS — BP 175/79 | HR 73 | Temp 97.2°F | Resp 18 | Ht 64.0 in | Wt 169.1 lb

## 2018-01-05 DIAGNOSIS — Z9012 Acquired absence of left breast and nipple: Secondary | ICD-10-CM | POA: Diagnosis not present

## 2018-01-05 DIAGNOSIS — Z170 Estrogen receptor positive status [ER+]: Principal | ICD-10-CM

## 2018-01-05 DIAGNOSIS — Z79899 Other long term (current) drug therapy: Secondary | ICD-10-CM | POA: Insufficient documentation

## 2018-01-05 DIAGNOSIS — Z923 Personal history of irradiation: Secondary | ICD-10-CM | POA: Insufficient documentation

## 2018-01-05 DIAGNOSIS — Z9221 Personal history of antineoplastic chemotherapy: Secondary | ICD-10-CM | POA: Insufficient documentation

## 2018-01-05 DIAGNOSIS — T451X5S Adverse effect of antineoplastic and immunosuppressive drugs, sequela: Secondary | ICD-10-CM

## 2018-01-05 DIAGNOSIS — E876 Hypokalemia: Secondary | ICD-10-CM | POA: Diagnosis not present

## 2018-01-05 DIAGNOSIS — C50412 Malignant neoplasm of upper-outer quadrant of left female breast: Secondary | ICD-10-CM

## 2018-01-05 DIAGNOSIS — G620 Drug-induced polyneuropathy: Secondary | ICD-10-CM | POA: Insufficient documentation

## 2018-01-05 DIAGNOSIS — E871 Hypo-osmolality and hyponatremia: Secondary | ICD-10-CM | POA: Insufficient documentation

## 2018-01-05 DIAGNOSIS — C50212 Malignant neoplasm of upper-inner quadrant of left female breast: Secondary | ICD-10-CM

## 2018-01-05 DIAGNOSIS — Z5112 Encounter for antineoplastic immunotherapy: Secondary | ICD-10-CM | POA: Diagnosis not present

## 2018-01-05 DIAGNOSIS — T451X5A Adverse effect of antineoplastic and immunosuppressive drugs, initial encounter: Secondary | ICD-10-CM

## 2018-01-05 DIAGNOSIS — Z95828 Presence of other vascular implants and grafts: Secondary | ICD-10-CM

## 2018-01-05 LAB — CMP (CANCER CENTER ONLY)
ALBUMIN: 3.8 g/dL (ref 3.5–5.0)
ALT: 16 U/L (ref 0–44)
AST: 23 U/L (ref 15–41)
Alkaline Phosphatase: 71 U/L (ref 38–126)
Anion gap: 7 (ref 5–15)
BUN: 9 mg/dL (ref 8–23)
CO2: 28 mmol/L (ref 22–32)
CREATININE: 0.78 mg/dL (ref 0.44–1.00)
Calcium: 9.3 mg/dL (ref 8.9–10.3)
Chloride: 94 mmol/L — ABNORMAL LOW (ref 98–111)
GFR, Est AFR Am: >60 mL/min (ref >60)
GFR, Estimated: >60 mL/min (ref >60)
GLUCOSE: 132 mg/dL — AB (ref 70–99)
Potassium: 3.8 mmol/L (ref 3.5–5.1)
SODIUM: 129 mmol/L — AB (ref 135–145)
Total Bilirubin: 0.6 mg/dL (ref 0.3–1.2)
Total Protein: 6.9 g/dL (ref 6.5–8.1)

## 2018-01-05 LAB — CBC WITH DIFFERENTIAL (CANCER CENTER ONLY)
ABS IMMATURE GRANULOCYTES: 0.01 10*3/uL (ref 0.00–0.07)
BASOS PCT: 2 %
Basophils Absolute: 0 10*3/uL (ref 0.0–0.1)
Eosinophils Absolute: 0.1 10*3/uL (ref 0.0–0.5)
Eosinophils Relative: 2 %
HEMATOCRIT: 34.8 % — AB (ref 36.0–46.0)
Hemoglobin: 11.3 g/dL — ABNORMAL LOW (ref 12.0–15.0)
IMMATURE GRANULOCYTES: 0 %
LYMPHS ABS: 0.7 10*3/uL (ref 0.7–4.0)
LYMPHS PCT: 25 %
MCH: 28.8 pg (ref 26.0–34.0)
MCHC: 32.5 g/dL (ref 30.0–36.0)
MCV: 88.8 fL (ref 80.0–100.0)
MONOS PCT: 13 %
Monocytes Absolute: 0.4 10*3/uL (ref 0.1–1.0)
Neutro Abs: 1.6 10*3/uL — ABNORMAL LOW (ref 1.7–7.7)
Neutrophils Relative %: 58 %
PLATELETS: 238 10*3/uL (ref 150–400)
RBC: 3.92 MIL/uL (ref 3.87–5.11)
RDW: 13.4 % (ref 11.5–15.5)
WBC Count: 2.7 10*3/uL — ABNORMAL LOW (ref 4.0–10.5)
nRBC: 0 % (ref 0.0–0.2)

## 2018-01-05 MED ORDER — SODIUM CHLORIDE 0.9% FLUSH
10.0000 mL | INTRAVENOUS | Status: DC | PRN
  Administered 2018-01-05: 10 mL
  Filled 2018-01-05: qty 10

## 2018-01-05 MED ORDER — ACETAMINOPHEN 325 MG PO TABS
650.0000 mg | ORAL_TABLET | Freq: Once | ORAL | Status: AC
  Administered 2018-01-05: 650 mg via ORAL

## 2018-01-05 MED ORDER — SODIUM CHLORIDE 0.9 % IV SOLN
Freq: Once | INTRAVENOUS | Status: AC
  Administered 2018-01-05: 11:00:00 via INTRAVENOUS
  Filled 2018-01-05: qty 250

## 2018-01-05 MED ORDER — HEPARIN SOD (PORK) LOCK FLUSH 100 UNIT/ML IV SOLN
500.0000 [IU] | Freq: Once | INTRAVENOUS | Status: AC | PRN
  Administered 2018-01-05: 500 [IU]
  Filled 2018-01-05: qty 5

## 2018-01-05 MED ORDER — DIPHENHYDRAMINE HCL 25 MG PO CAPS
50.0000 mg | ORAL_CAPSULE | Freq: Once | ORAL | Status: AC
  Administered 2018-01-05: 50 mg via ORAL

## 2018-01-05 MED ORDER — TRASTUZUMAB CHEMO 150 MG IV SOLR
450.0000 mg | Freq: Once | INTRAVENOUS | Status: AC
  Administered 2018-01-05: 450 mg via INTRAVENOUS
  Filled 2018-01-05: qty 21.4

## 2018-01-05 NOTE — Telephone Encounter (Signed)
Called patient, no answer.  Printed and mailed calendar

## 2018-01-05 NOTE — Patient Instructions (Signed)
[facility] Discharge Instructions for Patients Receiving Chemotherapy  Today you received the following chemotherapy agents Herceptin  To help prevent nausea and vomiting after your treatment, we encourage you to take your nausea medication as directed   If you develop nausea and vomiting that is not controlled by your nausea medication, call the clinic.   BELOW ARE SYMPTOMS THAT SHOULD BE REPORTED IMMEDIATELY:  *FEVER GREATER THAN 100.5 F  *CHILLS WITH OR WITHOUT FEVER  NAUSEA AND VOMITING THAT IS NOT CONTROLLED WITH YOUR NAUSEA MEDICATION  *UNUSUAL SHORTNESS OF BREATH  *UNUSUAL BRUISING OR BLEEDING  TENDERNESS IN MOUTH AND THROAT WITH OR WITHOUT PRESENCE OF ULCERS  *URINARY PROBLEMS  *BOWEL PROBLEMS  UNUSUAL RASH Items with * indicate a potential emergency and should be followed up as soon as possible.  Feel free to call the clinic should you have any questions or concerns. The clinic phone number is [phone].  Please show the CHEMO ALERT CARD at check-in to the Emergency Department and triage nurse.   

## 2018-01-05 NOTE — Progress Notes (Signed)
Penny Huynh  [phone] Fax:([phone]) [phone]  Clinic Follow up Note   Patient Care Team: Sasser, Silvestre Moment, MD as PCP - General (Cardiology) Penny Commons, MD as PCP - Cardiology (Cardiology) Truitt Merle, MD as Consulting Physician (Hematology) Excell Seltzer, MD as Consulting Physician (General Surgery) Alla Feeling, NP as Nurse Practitioner (Nurse Practitioner) Penny Commons, MD as Attending Physician (Cardiology) 01/05/2018  SUMMARY OF ONCOLOGIC HISTORY: Oncology History   Cancer Staging Carcinoma of upper-inner quadrant of left breast in female, estrogen receptor positive ([place]) Staging form: Breast, AJCC 8th Edition - Clinical stage from 01/18/2017: Stage IB (cT2, cN0, cM0, G3, ER: Positive, PR: Positive, HER2: Positive) - Signed by Truitt Merle, MD on 02/10/2017 - Pathologic stage from 02/23/2017: Stage IB (pT3, pN0, cM0, G3, ER: Positive, PR: Positive, HER2: Positive) - Signed by Alla Feeling, NP on 03/16/2017  Carcinoma of upper-outer quadrant of left breast in female, estrogen receptor positive (Penny Huynh) Staging form: Breast, AJCC 8th Edition - Clinical stage from 01/18/2017: Stage IB (cT2, cN0, cM0, G2, ER: Positive, PR: Positive, HER2: Negative) - Unsigned - Pathologic stage from 02/23/2017: Stage IB (pT2, pN0(sn), cM0, G3, ER: Positive, PR: Positive, HER2: Negative) - Signed by Alla Feeling, NP on 03/16/2017       Carcinoma of upper-outer quadrant of left breast in female, estrogen receptor positive (Penny Huynh)   01/13/2017 Mammogram    FINDINGS: In the left breast, a spiculated mass lies in the upper outer quadrant. In the medial posterior left breast, there is a larger lobulated mass. These masses correspond to the palpable abnormalities. No other discrete left breast masses.  In the right breast, there are no discrete masses. There are no areas of architectural distortion.  In both breasts there are scattered calcifications,  rounded punctate, with a few other larger coarse dystrophic calcifications, without significant change from the previous screening mammogram, which is dated 12/04/2009.  Mammographic images were processed with CAD.  On physical exam, there is a firm palpable mass in the upper outer left breast and another firm mass in the medial left breast.     01/13/2017 Breast US    Targeted ultrasound is performed, showing an irregular hypoechoic shadowing mass in the left breast at the 2:30 o'clock position, 5 cm the nipple, measuring 3.3 x 2.8 x 2.6 cm. In the 10:30 o'clock position of the left breast, 5 cm the nipple, there is irregular hypoechoic mass with somewhat more lobulated margins, corresponding to the lobulated mass seen mammographically. This measures 4.8 x 2.9 x 4.8 cm. Both masses show internal vascularity on color Doppler analysis.  Sonographic evaluation of the left axilla shows several nodes with thickened cortices. Status cortex measured is 5 mm. None of these lymph nodes appear enlarged but overall size criteria.  IMPRESSION: 1. Two masses in the left breast, 1 at the 2:30 o'clock position in the other at the 10:30 o'clock position, both highly suspicious for breast carcinoma. 2. Several abnormal left axillary lymph nodes with thickened cortices. 3. No evidence of right breast malignancy.     01/18/2017 Pathology Results    Diagnosis 1. Breast, left, needle core biopsy, 2:30 o'clock - INVASIVE MAMMARY CARCINOMA - MAMMARY CARCINOMA IN-SITU - SEE COMMENT 2. Lymph node, needle/core biopsy, left axilla - NO CARCINOMA IDENTIFIED - SEE COMMENT 3. Breast, left, needle core biopsy, 10:30 o'clock - INVASIVE DUCTAL CARCINOMA - SEE COMMENT  1. PROGNOSTIC INDICATORS Results: IMMUNOHISTOCHEMICAL AND MORPHOMETRIC ANALYSIS PERFORMED MANUALLY Estrogen Receptor: 100%, POSITIVE, STRONG STAINING  INTENSITY Progesterone Receptor: 60%, POSITIVE, STRONG STAINING  INTENSITY Proliferation Marker Ki67: 60%  1. FLUORESCENCE IN-SITU HYBRIDIZATION Results: HER2 - NEGATIVE RATIO OF HER2/CEP17 SIGNALS 1.28 AVERAGE HER2 COPY NUMBER PER CELL 2.75  3. PROGNOSTIC INDICATORS Results: IMMUNOHISTOCHEMICAL AND MORPHOMETRIC ANALYSIS PERFORMED MANUALLY Estrogen Receptor: 100%, POSITIVE, STRONG STAINING INTENSITY Progesterone Receptor: 50%, POSITIVE, STRONG STAINING INTENSITY Proliferation Marker Ki67: 70%  3. FLUORESCENCE IN-SITU HYBRIDIZATION Results: HER2 - **POSITIVE** RATIO OF HER2/CEP17 SIGNALS 2.80 AVERAGE HER2 COPY NUMBER PER CELL 6.15  Microscopic Comment 1. The biopsy material shows an infiltrative proliferation of cells with large vesicular nuclei with inconspicuous nucleoli, arranged linearly and in small clusters. Based on the biopsy, the carcinoma appears Nottingham grade 2 of 3 and measures 0.9 cm in greatest linear extent. E-cadherin and prognostic markers (ER/PR/ki-67/HER2-FISH)are pending and will be reported in an addendum. Dr. Lyndon Code reviewed the case and agrees with the above diagnosis.    02/10/2017 Initial Diagnosis    Carcinoma of upper-outer quadrant of left breast in female, estrogen receptor positive (Penny Huynh)    02/19/2017 Imaging    Bone Scan whole Body 02/19/17 IMPRESSION: Negative for evidence of bony metastatic disease.    02/19/2017 Imaging    CT CAP W Contrast 02/19/17 IMPRESSION: 1. Small right middle lobe pulmonary nodules up to 0.8 cm, nonspecific. Non-contrast chest CT at 3-6 months is recommended. If the nodules are stable at time of repeat CT, then future CT at 18-24 months (from today's scan) is considered optional for low-risk patients, but is recommended for high-risk patients. This recommendation follows the consensus statement: Guidelines for Management of Incidental Pulmonary Nodules Detected on CT Images: From the Fleischner Society 2017; Radiology 2017; 284:228-243. 2. Subcentimeter hepatic lesions likely cysts  but incompletely characterized due to small size. Attention on follow-up imaging recommended. 3. 2.1 cm fusiform right common iliac artery aneurysm. Continued surveillance recommended. 4. Aortic Atherosclerosis (ICD10-I70.0) and Emphysema (ICD10-J43.9).    02/23/2017 Pathology Results    Diagnosis 1. Breast, simple mastectomy, Left Total - INVASIVE DUCTAL CARCINOMA, MULTIFOCAL, NOTTINGHAM GRADE 3/3 (5.3 CM, 3.5 CM) - INVASIVE CARCINOMA INVOLVES THE DERMIS - DUCTAL CARCINOMA IN SITU, INTERMEDIATE GRADE - HYALINIZED FIBROADENOMA - MARGINS UNINVOLVED BY CARCINOMA - NO CARCINOMA IDENTIFIED IN TWO LYMPH NODES (0/2) - SEE ONCOLOGY TABLE AND COMMENT BELOW 2. Lymph node, sentinel, biopsy, Left Axillary - NO CARCINOMA IDENTIFIED IN ONE LYMPH NODE (0/1) 3. Lymph node, sentinel, biopsy - NO CARCINOMA IDENTIFIED IN ONE LYMPH NODE (0/1) 4. Lymph node, sentinel, biopsy - NO CARCINOMA IDENTIFIED IN ONE LYMPH NODE (0/1) 5. Lymph node, sentinel, biopsy - NO CARCINOMA IDENTIFIED IN ONE LYMPH NODE (0/1) Microscopic Comment 1. BREAST, INVASIVE TUMOR Procedure: Simple mastectomy Laterality: Left Tumor Size: 5. 3 cm, 3.5 cm Histologic Type: Invasive carcinoma of no special type (ductal, not otherwise specified) Grade: Nottingham Grade 3 Tubular Differentiation: 3 Nuclear Pleomorphism: 3 Mitotic Count: 2 Ductal Carcinoma in Situ (DCIS): Present Extent of Tumor: Skin: Invasive carcinoma directly invades into the dermis or epidermis without skin ulceration Margins: Invasive carcinoma, distance from closest margin: 1.5 cm (posterior) DCIS, distance from closest margin: > 1 cm Regional Lymph Nodes: Number of Lymph Nodes Examined: 6 Number of Sentinel Lymph Nodes Examined: 4 Lymph Nodes with Macrometastases: 0 Lymph Nodes with Micrometastases: 0 Lymph Nodes with Isolated Tumor Cells: 0 Treatment effect: No known presurgical therapy Breast Prognostic Profile: See Also ([id number]) Estrogen  Receptor: Positive (100%, strong); Positive (100%, strong) Progesterone Receptor: Positive (50%, strong); Positive (50%, strong) Her2: Positive (Ratio 2.80); Negative (Ratio 1.28)  Ki-67: 70%; 60% Best tumor block for sendout testing: 1E Pathologic Stage Classification (pTNM, AJCC 8th Edition): Primary Tumor: mpT3 Regional Lymph Nodes: pN0 COMMENT: The two invasive carcinomas in the breast have slightly different morphologies. The larger lesion has a papillary and micropapillary pattern admixed with typical ductal carcinoma while the smaller lesion is more typical of a ductal lesion with linear arrays (E-cadherin performed on biopsy). There are foci within the larger lesion that are concerning for lymphovascular space invasion.     04/07/2017 -  Chemotherapy    weekly taxol and herceptin x12 weeks, 04/07/17-06/23/17. Then herceptin q3 weeks for total 1 year which was switched to Kadcyla on 07/14/17.    07/20/2017 - 09/03/2017 Radiation Therapy    She started adjuvant radiation on 07/20/17 with Dr. Lisbeth Renshaw and plans to complete on 09/03/17.     08/24/2017 -  Chemotherapy    maintenance Herceptin    CURRENT THERAPY:  -weekly taxol and herceptin x12 weeks, 04/07/17-06/23/17. Then herceptin q3 weeks for total 1 year which was switched to Kadcyla on 07/14/17, and switchedback to maintenance Herceptin on September 02, 2017 due toworsening peripheral neuropathy. -anastrozole daily    INTERVAL HISTORY: Ms. Jakes returns with her spouse for f/u and herceptin as scheduled. She feels well other than persistent peripheral neuropathy. She saw Dr. Mickeal Skinner recently, elavil was stopped. She began lidocaine patches, which she uses once daily. She felt these are helping the pain in her feet overall, but pain has been increased for past few days and feet have been swollen. Wears compression stockings intermittently. She does PT exercises at home. Hands are numb but not painful. She sometimes doesn't know if she's touching  things. Hot flashes are stable. Denies joint pain. Appetite is good. Chronic constipation is well managed. Denies n/v/d, fever, chills, cough, chest pain, dyspnea, or skin rash. She will see Dr. Bronson Ing this month, mammogram in January.    MEDICAL HISTORY:  Past Medical History:  Diagnosis Date  . Arthritis    "maybe a little in my left knee" (02/23/2017)  . Asthmatic bronchitis   . Cancer of left breast ([place])   . Coronary atherosclerosis    Minor nonobstructive CAD at cardiac catheterization 2009  . Dyslipidemia   . Femoral bruit 7/08   With normal ABIs  . Hypertension   . Lower extremity edema    Chronic  . Pneumonia ~ 1950   in 8th grade  . Type II diabetes mellitus ([place])     SURGICAL HISTORY: Past Surgical History:  Procedure Laterality Date  . APPENDECTOMY    . BREAST BIOPSY Left 02/2017  . CARDIAC CATHETERIZATION     "long time ago" (02/23/2017)  . MASTECTOMY COMPLETE / SIMPLE W/ SENTINEL NODE BIOPSY Left 02/23/2017   TOTAL MATECTOMY;  LEFT AXILLARY SENTINEL LYMPH NODE BIOPSY ERAS PATHWAY  . MASTECTOMY WITH RADIOACTIVE SEED GUIDED EXCISION AND AXILLARY SENTINEL LYMPH NODE BIOPSY Left 02/23/2017   Procedure: LEFT TOTAL MATECTOMY;  LEFT AXILLARY SENTINEL LYMPH NODE BIOPSY ERAS PATHWAY;  Surgeon: Excell Seltzer, MD;  Location: [facility];  Service: General;  Laterality: Left;  . PILONIDAL CYST EXCISION    . PORTA CATH INSERTION  02/23/2017  . PORTACATH PLACEMENT N/A 02/23/2017   Procedure: INSERTION PORT-A-CATH;  Surgeon: Excell Seltzer, MD;  Location: [facility];  Service: General;  Laterality: N/A;  . SHOULDER ARTHROSCOPY W/ ROTATOR CUFF REPAIR Right     I have reviewed the social history and family history with the patient and they are unchanged from previous  note.  ALLERGIES:  has No Known Allergies.  MEDICATIONS:  Current Outpatient Medications  Medication Sig Dispense Refill  . amLODipine (NORVASC) 5 MG tablet TAKE 1 TABLET BY MOUTH ONCE DAILY 90 tablet 0  .  anastrozole (ARIMIDEX) 1 MG tablet TAKE 1 TABLET BY MOUTH ONCE DAILY 30 tablet 1  . aspirin 81 MG EC tablet Take 81 mg by mouth at bedtime.     . chlorthalidone (HYGROTON) 25 MG tablet Take 25 mg by mouth daily.     . Coenzyme Q10 (COQ10) 100 MG CAPS Take 100 mg by mouth daily.      Marland Kitchen latanoprost (XALATAN) 0.005 % ophthalmic solution Place 1 drop into both eyes at bedtime.    . Lidocaine 3.5 % PTCH Apply 1 patch topically daily as needed. To each foot as needed for pain 60 patch 3  . lidocaine-prilocaine (EMLA) cream APPLY CREAM TOPICALLY TO AFFECTED AREA ONCE  3  . losartan (COZAAR) 50 MG tablet Take 50 mg by mouth daily.    . metFORMIN (GLUCOPHAGE) 500 MG tablet Take 1,000 mg by mouth daily with breakfast.     . pregabalin (LYRICA) 75 MG capsule Take 2 capsules (150 mg total) by mouth 2 (two) times daily. For first week of treatment, take '75mg'$  two times daily 120 capsule 1  . vitamin B-12 (CYANOCOBALAMIN) 1000 MCG tablet Take 1,000 mcg by mouth daily.     No current facility-administered medications for this visit.    Facility-Administered Medications Ordered in Other Visits  Medication Dose Route Frequency Provider Last Rate Last Dose  . sodium chloride flush (NS) 0.9 % injection 10 mL  10 mL Intracatheter PRN Truitt Merle, MD   10 mL at 01/05/18 1204    PHYSICAL EXAMINATION: ECOG PERFORMANCE STATUS: 1 - Symptomatic but completely ambulatory  Vitals:   01/05/18 0951  BP: (!) 175/79  Pulse: 73  Resp: 18  Temp: (!) 97.2 F (36.2 C)  SpO2: 99%   Filed Weights   01/05/18 0951  Weight: 169 lb 1.6 oz (76.7 kg)    GENERAL:alert, no distress and comfortable SKIN:  no rashes or significant lesions EYES:  sclera clear LUNGS: clear to auscultation, with normal breathing effort HEART: regular rate & rhythm, trace pedal edema ABDOMEN:abdomen soft, non-tender and normal bowel sounds NEURO: alert & oriented x 3 with fluent speech, no focal motor/sensory deficits Breast exam deferred PAC  without erythema    LABORATORY DATA:  I have reviewed the data as listed CBC Latest Ref Rng & Units 01/05/2018 12/15/2017 11/24/2017  WBC 4.0 - 10.5 K/uL 2.7(L) 3.2(L) 4.3  Hemoglobin 12.0 - 15.0 g/dL 11.3(L) 11.2(L) 10.9(L)  Hematocrit 36.0 - 46.0 % 34.8(L) 35.4(L) 34.5(L)  Platelets 150 - 400 K/uL 238 261 239     CMP Latest Ref Rng & Units 01/05/2018 12/15/2017 11/24/2017  Glucose 70 - 99 mg/dL 132(H) 74 136(H)  BUN 8 - 23 mg/dL '9 10 8  '$ Creatinine 0.44 - 1.00 mg/dL 0.78 0.76 0.74  Sodium 135 - 145 mmol/L 129(L) 131(L) 133(L)  Potassium 3.5 - 5.1 mmol/L 3.8 3.8 3.9  Chloride 98 - 111 mmol/L 94(L) 96(L) 97(L)  CO2 22 - 32 mmol/L '28 28 26  '$ Calcium 8.9 - 10.3 mg/dL 9.3 9.5 9.6  Total Protein 6.5 - 8.1 g/dL 6.9 7.0 6.8  Total Bilirubin 0.3 - 1.2 mg/dL 0.6 0.6 0.6  Alkaline Phos 38 - 126 U/L 71 71 69  AST 15 - 41 U/L '23 21 21  '$ ALT 0 - 44  U/L '16 13 14      '$ RADIOGRAPHIC STUDIES: I have personally reviewed the radiological images as listed and agreed with the findings in the report. No results found.   ASSESSMENT & PLAN: Penny Huynh is an67 y.o.female with past medical history positive for HTN, DM, CAD now with newly diagnosed left breast cancer  1.Multifocal invasive ductal carcinoma of the left breast of female;carcinoma of the upper-outer quadrant of left breast and female, ER/PR positive, HER-2 negative pT2N0M0, G2, Stage 1B; IDC of the upper-inner quadrant left breast, ER+/PR+/HER2+, pT3N0M0 G3, stage IB 2. HTN, CAD, DM -07/08/17 echo with EF 55% -10/27/17 EF 55-60% 3. Hyponatremia, hypokalemia 4. Peripheral neuropathy, G2-3, secondary to taxol, DM and T-DM1  Ms. Kratky appears stable. She completed another cycle of maintenance herceptin. She continues to tolerate well. She has persistent stable peripheral neuropathy, followed by Dr. Mickeal Skinner. She is due cardiology f/u and repeat echo this month. Annual screening mammogram for right breast is scheduled in January. She  continues adjuvant anastrozole, tolerating well with mild hot flashes. Labs reviewed, mild neutropenia and chronic hyponatremia, otherwise stable. Proceed with next cycle herceptin today, continue q3 weeks. Plan to complete in 04/2018. F/u in 6 weeks.   PLAN: -Labs reviewed, proceed with next cycle Herceptin today, q3 weeks -Cardiology f/u and echo this month -Screening R mammo 02/2018 -Lab and next herceptin in 3 weeks -F/u in 6 weeks   All questions were answered. The patient knows to call the clinic with any problems, questions or concerns. No barriers to learning was detected.     Alla Feeling, NP 01/05/18

## 2018-01-10 DIAGNOSIS — H21561 Pupillary abnormality, right eye: Secondary | ICD-10-CM | POA: Diagnosis not present

## 2018-01-10 DIAGNOSIS — H25811 Combined forms of age-related cataract, right eye: Secondary | ICD-10-CM | POA: Diagnosis not present

## 2018-01-10 DIAGNOSIS — H2511 Age-related nuclear cataract, right eye: Secondary | ICD-10-CM | POA: Diagnosis not present

## 2018-01-17 ENCOUNTER — Other Ambulatory Visit: Payer: Self-pay | Admitting: Internal Medicine

## 2018-01-18 ENCOUNTER — Ambulatory Visit (INDEPENDENT_AMBULATORY_CARE_PROVIDER_SITE_OTHER): Payer: Medicare Other | Admitting: Cardiovascular Disease

## 2018-01-18 ENCOUNTER — Other Ambulatory Visit: Payer: Self-pay | Admitting: Internal Medicine

## 2018-01-18 ENCOUNTER — Telehealth: Payer: Self-pay | Admitting: Cardiovascular Disease

## 2018-01-18 ENCOUNTER — Encounter: Payer: Self-pay | Admitting: Cardiovascular Disease

## 2018-01-18 VITALS — BP 160/88 | HR 73 | Ht 64.0 in | Wt 172.0 lb

## 2018-01-18 DIAGNOSIS — I2510 Atherosclerotic heart disease of native coronary artery without angina pectoris: Secondary | ICD-10-CM

## 2018-01-18 DIAGNOSIS — I4510 Unspecified right bundle-branch block: Secondary | ICD-10-CM

## 2018-01-18 DIAGNOSIS — I10 Essential (primary) hypertension: Secondary | ICD-10-CM | POA: Diagnosis not present

## 2018-01-18 DIAGNOSIS — I25118 Atherosclerotic heart disease of native coronary artery with other forms of angina pectoris: Secondary | ICD-10-CM

## 2018-01-18 DIAGNOSIS — C50412 Malignant neoplasm of upper-outer quadrant of left female breast: Secondary | ICD-10-CM

## 2018-01-18 DIAGNOSIS — Z170 Estrogen receptor positive status [ER+]: Secondary | ICD-10-CM

## 2018-01-18 DIAGNOSIS — Z09 Encounter for follow-up examination after completed treatment for conditions other than malignant neoplasm: Secondary | ICD-10-CM

## 2018-01-18 DIAGNOSIS — I519 Heart disease, unspecified: Secondary | ICD-10-CM

## 2018-01-18 MED ORDER — PREGABALIN 75 MG PO CAPS: 150.0000 mg | ORAL_CAPSULE | Freq: Two times a day (BID) | ORAL | 3 refills | Status: DC

## 2018-01-18 MED ORDER — LOSARTAN POTASSIUM 50 MG PO TABS: 50.0000 mg | ORAL_TABLET | Freq: Two times a day (BID) | ORAL | 2 refills | Status: DC

## 2018-01-18 NOTE — Patient Instructions (Addendum)
Medication Instructions:   Your physician has recommended you make the following change in your medication:   Increase losartan to 50 mg by mouth twice daily.  Continue all other medications the same.  Labwork:  NONE  Testing/Procedures: Your physician has requested that you have an echocardiogram. Echocardiography is a painless test that uses sound waves to create images of your heart. It provides your doctor with information about the size and shape of your heart and how well your heart's chambers and valves are working. This procedure takes approximately one hour. There are no restrictions for this procedure.  Follow-Up:  Your physician recommends that you schedule a follow-up appointment in: 6 months. You will receive a reminder letter in the mail in about 4 months reminding you to call and schedule your appointment. If you don't receive this letter, please contact our office.  Any Other Special Instructions Will Be Listed Below (If Applicable).  If you need a refill on your cardiac medications before your next appointment, please call your pharmacy.

## 2018-01-18 NOTE — Progress Notes (Signed)
SUBJECTIVE: The patient presents for annual follow-up. She has nonobstructive coronary artery disease, hypertension, and diastolic dysfunction.  She also has left-sided breast cancer and underwent left total mastectomy in January 2019 and was treated with Taxol and Herceptin.  She also underwent radiation therapy.    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 She had also been taking an [place] class and amath class.  She underwent a normal nuclear stress test on 08/09/15 and demonstrated a hypertensive response to exercise.  Echocardiogram 10/27/2017: Normal left ventricular systolic function and regional wall motion, LVEF 55 to 60%, with mild mitral and tricuspid regurgitation.  She is doing well and denies chest pain, palpitations, orthopnea, and shortness of breath.  She did not have any nausea or vomiting with chemotherapy and did not need to use pain medications.  She continues to use a left arm sleeve for lymphedema.  She checks her blood pressure at home and there have been several systolic readings in the 161 range.  ECG performed in the office today which I ordered and personally interpreted demonstrates normal sinus rhythm with a right bundle branch block.      Review of Systems: As per "subjective", otherwise negative.  No Known Allergies  Current Outpatient Medications  Medication Sig Dispense Refill  . amLODipine (NORVASC) 5 MG tablet TAKE 1 TABLET BY MOUTH ONCE DAILY 90 tablet 0  . anastrozole (ARIMIDEX) 1 MG tablet TAKE 1 TABLET BY MOUTH ONCE DAILY 30 tablet 1  . aspirin 81 MG EC tablet Take 81 mg by mouth at bedtime.     . chlorthalidone (HYGROTON) 25 MG tablet Take 25 mg by mouth daily.     . Coenzyme Q10 (COQ10) 100 MG CAPS Take 100 mg by mouth daily.      Marland Kitchen latanoprost (XALATAN) 0.005 % ophthalmic solution Place 1 drop into both eyes at bedtime.    . lidocaine-prilocaine (EMLA) cream  APPLY CREAM TOPICALLY TO AFFECTED AREA ONCE  3  . losartan (COZAAR) 50 MG tablet Take 50 mg by mouth daily.    . metFORMIN (GLUCOPHAGE) 500 MG tablet Take 1,000 mg by mouth daily with breakfast.     . pregabalin (LYRICA) 75 MG capsule Take 2 capsules (150 mg total) by mouth 2 (two) times daily. For first week of treatment, take 75mg  two times daily 120 capsule 3  . vitamin B-12 (CYANOCOBALAMIN) 1000 MCG tablet Take 1,000 mcg by mouth daily.     No current facility-administered medications for this visit.     Past Medical History:  Diagnosis Date  . Arthritis    "maybe a little in my left knee" (02/23/2017)  . Asthmatic bronchitis   . Cancer of left breast ([place])   . Coronary atherosclerosis    Minor nonobstructive CAD at cardiac catheterization 2009  . Dyslipidemia   . Femoral bruit 7/08   With normal ABIs  . Hypertension   . Lower extremity edema    Chronic  . Pneumonia ~ 1950   in 8th grade  . Type II diabetes mellitus ([place])     Past Surgical History:  Procedure Laterality Date  . APPENDECTOMY    . BREAST BIOPSY Left 02/2017  . CARDIAC CATHETERIZATION     "long time ago" (02/23/2017)  . MASTECTOMY COMPLETE / SIMPLE W/ SENTINEL NODE BIOPSY Left 02/23/2017   TOTAL MATECTOMY;  LEFT AXILLARY SENTINEL LYMPH NODE BIOPSY ERAS PATHWAY  . MASTECTOMY WITH RADIOACTIVE SEED GUIDED  EXCISION AND AXILLARY SENTINEL LYMPH NODE BIOPSY Left 02/23/2017   Procedure: LEFT TOTAL MATECTOMY;  LEFT AXILLARY SENTINEL LYMPH NODE BIOPSY ERAS PATHWAY;  Surgeon: Excell Seltzer, MD;  Location: [facility];  Service: General;  Laterality: Left;  . PILONIDAL CYST EXCISION    . PORTA CATH INSERTION  02/23/2017  . PORTACATH PLACEMENT N/A 02/23/2017   Procedure: INSERTION PORT-A-CATH;  Surgeon: Excell Seltzer, MD;  Location: [facility];  Service: General;  Laterality: N/A;  . SHOULDER ARTHROSCOPY W/ ROTATOR CUFF REPAIR Right     Social History   Socioeconomic History  . Marital status: Married    Spouse name:  Not on file  . Number of children: 2  . Years of education: Not on file  . Highest education level: Not on file  Occupational History  . Occupation: Counselling psychologist for [employer]    Comment: only works during tax season but not currently   . Occupation: in Dentist   . Occupation: retired Mining engineer of education     Comment: [employer]  . Financial resource strain: Not on file  . Food insecurity:    Worry: Not on file    Inability: Not on file  . Transportation needs:    Medical: Not on file    Non-medical: Not on file  Tobacco Use  . Smoking status: Former Smoker    Packs/day: 2.00    Years: 46.00    Pack years: 92.00    Types: Cigarettes    Start date: [birth year]/09/1951    Last attempt to quit: 2000    Years since quitting: 19.9  . Smokeless tobacco: Never Used  Substance and Sexual Activity  . Alcohol use: Yes    Alcohol/week: 2.0 standard drinks    Types: 2 Glasses of wine per week  . Drug use: No  . Sexual activity: Not on file  Lifestyle  . Physical activity:    Days per week: Not on file    Minutes per session: Not on file  . Stress: Not on file  Relationships  . Social connections:    Talks on phone: Not on file    Gets together: Not on file    Attends religious service: Not on file    Active member of club or organization: Not on file    Attends meetings of clubs or organizations: Not on file    Relationship status: Not on file  . Intimate partner violence:    Fear of current or ex partner: Not on file    Emotionally abused: Not on file    Physically abused: Not on file    Forced sexual activity: Not on file  Other Topics Concern  . Not on file  Social History Narrative   Lives in [place] with her husband.   Has 2 grown children.   10-25-17 Unable to ask abuse questions husband with her today.     Vitals:   01/18/18 1458  BP: (!) 160/88  Pulse: 73  SpO2: 97%  Weight: 172 lb (78 kg)  Height: 5\' 4"  (1.626 m)    Wt Readings from Last 3 Encounters:    01/18/18 172 lb (78 kg)  01/05/18 169 lb 1.6 oz (76.7 kg)  12/24/17 172 lb 1.6 oz (78.1 kg)     PHYSICAL EXAM General: NAD HEENT: Normal. Neck: No JVD, no thyromegaly. Lungs: Clear to auscultation bilaterally with normal respiratory effort. CV: Regular rate and rhythm, normal S1/S2, no S3/S4, no murmur. No pretibial or periankle edema.  No carotid bruit.   Abdomen: Soft, nontender, no distention.  Neurologic: Alert and oriented.  Psych: Normal affect. Skin: Normal. Musculoskeletal: No gross deformities.    ECG: Reviewed above under Subjective   Labs: Lab Results  Component Value Date/Time   K 3.8 01/05/2018 08:54 AM   BUN 9 01/05/2018 08:54 AM   CREATININE 0.78 01/05/2018 08:54 AM   ALT 16 01/05/2018 08:54 AM   HGB 11.3 (L) 01/05/2018 08:54 AM     Lipids: No results found for: LDLCALC, LDLDIRECT, CHOL, TRIG, HDL     ASSESSMENT AND PLAN: 1. Nonobstructive CAD: Symptomatically stable. Normal nuclear stress test on 08/09/15.  Currently on aspirin.  2. Essential HTN: Elevated on current doses of chlorthalidone, amlodipine, and losartan.    I will increase losartan to 50 mg twice daily.  3. Diastolic dysfunction: No evidence of volume overload.  Blood pressure is elevated.  Indeterminate diastolic function by most recent echocardiogram in September 2019.  4.  Left-sided breast cancer: Status post treatment with Taxol and Herceptin and radiation therapy.  Cardiac function will need continued monitoring.    LVEF 55 to 60% by echocardiogram in September 2019 as detailed above.  I will obtain a follow-up echocardiogram.     Disposition: Follow up 6 months   Kate Sable, M.D., F.A.C.C.

## 2018-01-18 NOTE — Telephone Encounter (Signed)
°  Precert needed for: 2 D Echo dx: post chemotherapy   Location: [facility]     Date: Feb 01, 2018

## 2018-01-24 ENCOUNTER — Other Ambulatory Visit: Payer: Self-pay | Admitting: Hematology

## 2018-01-27 ENCOUNTER — Inpatient Hospital Stay: Payer: Medicare Other

## 2018-01-27 VITALS — BP 142/76 | HR 71 | Temp 98.5°F | Resp 17 | Ht 64.0 in | Wt 167.0 lb

## 2018-01-27 DIAGNOSIS — Z170 Estrogen receptor positive status [ER+]: Principal | ICD-10-CM

## 2018-01-27 DIAGNOSIS — I10 Essential (primary) hypertension: Secondary | ICD-10-CM

## 2018-01-27 DIAGNOSIS — C50412 Malignant neoplasm of upper-outer quadrant of left female breast: Secondary | ICD-10-CM

## 2018-01-27 DIAGNOSIS — Z5112 Encounter for antineoplastic immunotherapy: Secondary | ICD-10-CM | POA: Diagnosis not present

## 2018-01-27 DIAGNOSIS — C50212 Malignant neoplasm of upper-inner quadrant of left female breast: Secondary | ICD-10-CM

## 2018-01-27 DIAGNOSIS — Z95828 Presence of other vascular implants and grafts: Secondary | ICD-10-CM

## 2018-01-27 DIAGNOSIS — E871 Hypo-osmolality and hyponatremia: Secondary | ICD-10-CM | POA: Diagnosis not present

## 2018-01-27 DIAGNOSIS — E876 Hypokalemia: Secondary | ICD-10-CM | POA: Diagnosis not present

## 2018-01-27 LAB — CBC WITH DIFFERENTIAL (CANCER CENTER ONLY)
Abs Immature Granulocytes: 0.01 10*3/uL (ref 0.00–0.07)
BASOS ABS: 0.1 10*3/uL (ref 0.0–0.1)
Basophils Relative: 2 %
Eosinophils Absolute: 0.1 10*3/uL (ref 0.0–0.5)
Eosinophils Relative: 2 %
HEMATOCRIT: 36 % (ref 36.0–46.0)
Hemoglobin: 11.5 g/dL — ABNORMAL LOW (ref 12.0–15.0)
IMMATURE GRANULOCYTES: 0 %
LYMPHS ABS: 1.3 10*3/uL (ref 0.7–4.0)
Lymphocytes Relative: 33 %
MCH: 28.8 pg (ref 26.0–34.0)
MCHC: 31.9 g/dL (ref 30.0–36.0)
MCV: 90 fL (ref 80.0–100.0)
Monocytes Absolute: 0.5 10*3/uL (ref 0.1–1.0)
Monocytes Relative: 14 %
Neutro Abs: 1.9 10*3/uL (ref 1.7–7.7)
Neutrophils Relative %: 49 %
Platelet Count: 266 10*3/uL (ref 150–400)
RBC: 4 MIL/uL (ref 3.87–5.11)
RDW: 13 % (ref 11.5–15.5)
WBC Count: 3.9 10*3/uL — ABNORMAL LOW (ref 4.0–10.5)
nRBC: 0 % (ref 0.0–0.2)

## 2018-01-27 LAB — CMP (CANCER CENTER ONLY)
ALK PHOS: 80 U/L (ref 38–126)
ALT: 16 U/L (ref 0–44)
AST: 20 U/L (ref 15–41)
Albumin: 3.8 g/dL (ref 3.5–5.0)
Anion gap: 8 (ref 5–15)
BUN: 11 mg/dL (ref 8–23)
CO2: 28 mmol/L (ref 22–32)
Calcium: 9.5 mg/dL (ref 8.9–10.3)
Chloride: 97 mmol/L — ABNORMAL LOW (ref 98–111)
Creatinine: 0.75 mg/dL (ref 0.44–1.00)
GFR, Est AFR Am: >60 mL/min (ref >60)
GFR, Estimated: >60 mL/min (ref >60)
Glucose, Bld: 66 mg/dL — ABNORMAL LOW (ref 70–99)
Potassium: 4 mmol/L (ref 3.5–5.1)
SODIUM: 133 mmol/L — AB (ref 135–145)
Total Bilirubin: 0.5 mg/dL (ref 0.3–1.2)
Total Protein: 6.9 g/dL (ref 6.5–8.1)

## 2018-01-27 MED ORDER — HEPARIN SOD (PORK) LOCK FLUSH 100 UNIT/ML IV SOLN
500.0000 [IU] | Freq: Once | INTRAVENOUS | Status: AC | PRN
  Administered 2018-01-27: 500 [IU]
  Filled 2018-01-27: qty 5

## 2018-01-27 MED ORDER — ACETAMINOPHEN 325 MG PO TABS
ORAL_TABLET | ORAL | Status: AC
  Filled 2018-01-27: qty 2

## 2018-01-27 MED ORDER — SODIUM CHLORIDE 0.9 % IV SOLN
Freq: Once | INTRAVENOUS | Status: AC
  Administered 2018-01-27: 14:00:00 via INTRAVENOUS
  Filled 2018-01-27: qty 250

## 2018-01-27 MED ORDER — DIPHENHYDRAMINE HCL 25 MG PO CAPS
50.0000 mg | ORAL_CAPSULE | Freq: Once | ORAL | Status: AC
  Administered 2018-01-27: 50 mg via ORAL

## 2018-01-27 MED ORDER — DIPHENHYDRAMINE HCL 25 MG PO CAPS
ORAL_CAPSULE | ORAL | Status: AC
  Filled 2018-01-27: qty 2

## 2018-01-27 MED ORDER — CLONIDINE HCL 0.1 MG PO TABS
ORAL_TABLET | ORAL | Status: AC
  Filled 2018-01-27: qty 1

## 2018-01-27 MED ORDER — ACETAMINOPHEN 325 MG PO TABS
650.0000 mg | ORAL_TABLET | Freq: Once | ORAL | Status: AC
  Administered 2018-01-27: 650 mg via ORAL

## 2018-01-27 MED ORDER — SODIUM CHLORIDE 0.9% FLUSH
10.0000 mL | INTRAVENOUS | Status: DC | PRN
  Administered 2018-01-27: 10 mL
  Filled 2018-01-27: qty 10

## 2018-01-27 MED ORDER — TRASTUZUMAB CHEMO 150 MG IV SOLR
450.0000 mg | Freq: Once | INTRAVENOUS | Status: AC
  Administered 2018-01-27: 450 mg via INTRAVENOUS
  Filled 2018-01-27: qty 21.43

## 2018-01-27 MED ORDER — CLONIDINE HCL 0.1 MG PO TABS
0.1000 mg | ORAL_TABLET | Freq: Once | ORAL | Status: AC
  Administered 2018-01-27: 0.1 mg via ORAL

## 2018-01-27 NOTE — Progress Notes (Signed)
[name] aware of pt's elevated BP. Pt reports forgetting to take her BP medications for the past 2 days. Denies any headaches, blurry vision, chest pain, or SOB. Received orders for 0.1 mg Clonidine PO and OK for pt to receive Herceptin today. Will recheck BP before pt leaves today. Pt and daughter aware of plan and verbalized understanding/agreement. Will continue to monitor  BP recheck was 142/76. [name] made aware.  Vitals:   01/27/18 1310 01/27/18 1510  BP: (!) 200/91 (!) 142/76  Pulse: 71   Resp: 17   Temp: 98.5 F (36.9 C)   TempSrc: Oral   SpO2: 100%   Weight: 167 lb (75.8 kg)   Height: 5\' 4"  (1.626 m)

## 2018-01-27 NOTE — Patient Instructions (Signed)
Cancer Center Discharge Instructions for Patients Receiving Chemotherapy  Today you received the following chemotherapy agents: Trastuzumab (Herceptin)  To help prevent nausea and vomiting after your treatment, we encourage you to take your nausea medication as directed.    If you develop nausea and vomiting that is not controlled by your nausea medication, call the clinic.   BELOW ARE SYMPTOMS THAT SHOULD BE REPORTED IMMEDIATELY:  *FEVER GREATER THAN 100.5 F  *CHILLS WITH OR WITHOUT FEVER  NAUSEA AND VOMITING THAT IS NOT CONTROLLED WITH YOUR NAUSEA MEDICATION  *UNUSUAL SHORTNESS OF BREATH  *UNUSUAL BRUISING OR BLEEDING  TENDERNESS IN MOUTH AND THROAT WITH OR WITHOUT PRESENCE OF ULCERS  *URINARY PROBLEMS  *BOWEL PROBLEMS  UNUSUAL RASH Items with * indicate a potential emergency and should be followed up as soon as possible.  Feel free to call the clinic should you have any questions or concerns. The clinic phone number is [phone].  Please show the CHEMO ALERT CARD at check-in to the Emergency Department and triage nurse.    

## 2018-02-01 ENCOUNTER — Other Ambulatory Visit: Payer: Self-pay

## 2018-02-01 ENCOUNTER — Ambulatory Visit (INDEPENDENT_AMBULATORY_CARE_PROVIDER_SITE_OTHER): Payer: Medicare Other

## 2018-02-01 DIAGNOSIS — I4510 Unspecified right bundle-branch block: Secondary | ICD-10-CM

## 2018-02-01 DIAGNOSIS — Z09 Encounter for follow-up examination after completed treatment for conditions other than malignant neoplasm: Secondary | ICD-10-CM | POA: Diagnosis not present

## 2018-02-01 DIAGNOSIS — I519 Heart disease, unspecified: Secondary | ICD-10-CM

## 2018-02-01 DIAGNOSIS — I25118 Atherosclerotic heart disease of native coronary artery with other forms of angina pectoris: Secondary | ICD-10-CM

## 2018-02-11 ENCOUNTER — Ambulatory Visit
Admission: RE | Admit: 2018-02-11 | Discharge: 2018-02-11 | Disposition: A | Discharge status: Home / Self Care | Payer: Medicare Other | Source: Ambulatory Visit | Attending: Nurse Practitioner | Admitting: Nurse Practitioner

## 2018-02-11 DIAGNOSIS — Z1231 Encounter for screening mammogram for malignant neoplasm of breast: Secondary | ICD-10-CM | POA: Diagnosis not present

## 2018-02-15 NOTE — Progress Notes (Signed)
[place]   [phone]) [phone] [phone]   Clinic Follow up Note   Patient Care Team: Sasser, Silvestre Moment, MD as PCP - General (Cardiology) [name], MD as PCP - Cardiology (Cardiology) Truitt Merle, MD as Consulting Physician (Hematology) Excell Seltzer, MD as Consulting Physician (General Surgery) Alla Feeling, NP as Nurse Practitioner (Nurse Practitioner) [name], MD as Attending Physician (Cardiology) 02/17/2018  CHIEF COMPLAINT: F/u on left breast cancer   SUMMARY OF ONCOLOGIC HISTORY: Oncology History   Cancer Staging Carcinoma of upper-inner quadrant of left breast in female, estrogen receptor positive ([place]) Staging form: Breast, AJCC 8th Edition - Clinical stage from 01/18/2017: Stage IB (cT2, cN0, cM0, G3, ER: Positive, PR: Positive, HER2: Positive) - Signed by Truitt Merle, MD on 02/10/2017 - Pathologic stage from 02/23/2017: Stage IB (pT3, pN0, cM0, G3, ER: Positive, PR: Positive, HER2: Positive) - Signed by Alla Feeling, NP on 03/16/2017  Carcinoma of upper-outer quadrant of left breast in female, estrogen receptor positive ([place]) Staging form: Breast, AJCC 8th Edition - Clinical stage from 01/18/2017: Stage IB (cT2, cN0, cM0, G2, ER: Positive, PR: Positive, HER2: Negative) - Unsigned - Pathologic stage from 02/23/2017: Stage IB (pT2, pN0(sn), cM0, G3, ER: Positive, PR: Positive, HER2: Negative) - Signed by Alla Feeling, NP on 03/16/2017       Carcinoma of upper-outer quadrant of left breast in female, estrogen receptor positive ([place])   01/13/2017 Mammogram    FINDINGS: In the left breast, a spiculated mass lies in the upper outer quadrant. In the medial posterior left breast, there is a larger lobulated mass. These masses correspond to the palpable abnormalities. No other discrete left breast masses.  In the right breast, there are no discrete masses. There are no areas of architectural distortion.  In both  breasts there are scattered calcifications, rounded punctate, with a few other larger coarse dystrophic calcifications, without significant change from the previous screening mammogram, which is dated 12/04/2009.  Mammographic images were processed with CAD.  On physical exam, there is a firm palpable mass in the upper outer left breast and another firm mass in the medial left breast.     01/13/2017 Breast US    Targeted ultrasound is performed, showing an irregular hypoechoic shadowing mass in the left breast at the 2:30 o'clock position, 5 cm the nipple, measuring 3.3 x 2.8 x 2.6 cm. In the 10:30 o'clock position of the left breast, 5 cm the nipple, there is irregular hypoechoic mass with somewhat more lobulated margins, corresponding to the lobulated mass seen mammographically. This measures 4.8 x 2.9 x 4.8 cm. Both masses show internal vascularity on color Doppler analysis.  Sonographic evaluation of the left axilla shows several nodes with thickened cortices. Status cortex measured is 5 mm. None of these lymph nodes appear enlarged but overall size criteria.  IMPRESSION: 1. Two masses in the left breast, 1 at the 2:30 o'clock position in the other at the 10:30 o'clock position, both highly suspicious for breast carcinoma. 2. Several abnormal left axillary lymph nodes with thickened cortices. 3. No evidence of right breast malignancy.     01/18/2017 Pathology Results    Diagnosis 1. Breast, left, needle core biopsy, 2:30 o'clock - INVASIVE MAMMARY CARCINOMA - MAMMARY CARCINOMA IN-SITU - SEE COMMENT 2. Lymph node, needle/core biopsy, left axilla - NO CARCINOMA IDENTIFIED - SEE COMMENT 3. Breast, left, needle core biopsy, 10:30 o'clock - INVASIVE DUCTAL CARCINOMA - SEE COMMENT  1. PROGNOSTIC INDICATORS Results: IMMUNOHISTOCHEMICAL  AND MORPHOMETRIC ANALYSIS PERFORMED MANUALLY Estrogen Receptor: 100%, POSITIVE, STRONG STAINING INTENSITY Progesterone Receptor:  60%, POSITIVE, STRONG STAINING INTENSITY Proliferation Marker Ki67: 60%  1. FLUORESCENCE IN-SITU HYBRIDIZATION Results: HER2 - NEGATIVE RATIO OF HER2/CEP17 SIGNALS 1.28 AVERAGE HER2 COPY NUMBER PER CELL 2.75  3. PROGNOSTIC INDICATORS Results: IMMUNOHISTOCHEMICAL AND MORPHOMETRIC ANALYSIS PERFORMED MANUALLY Estrogen Receptor: 100%, POSITIVE, STRONG STAINING INTENSITY Progesterone Receptor: 50%, POSITIVE, STRONG STAINING INTENSITY Proliferation Marker Ki67: 70%  3. FLUORESCENCE IN-SITU HYBRIDIZATION Results: HER2 - **POSITIVE** RATIO OF HER2/CEP17 SIGNALS 2.80 AVERAGE HER2 COPY NUMBER PER CELL 6.15  Microscopic Comment 1. The biopsy material shows an infiltrative proliferation of cells with large vesicular nuclei with inconspicuous nucleoli, arranged linearly and in small clusters. Based on the biopsy, the carcinoma appears Nottingham grade 2 of 3 and measures 0.9 cm in greatest linear extent. E-cadherin and prognostic markers (ER/PR/ki-67/HER2-FISH)are pending and will be reported in an addendum. Dr. Lyndon Code reviewed the case and agrees with the above diagnosis.    02/10/2017 Initial Diagnosis    Carcinoma of upper-outer quadrant of left breast in female, estrogen receptor positive ([place])    02/19/2017 Imaging    Bone Scan whole Body 02/19/17 IMPRESSION: Negative for evidence of bony metastatic disease.    02/19/2017 Imaging    CT CAP W Contrast 02/19/17 IMPRESSION: 1. Small right middle lobe pulmonary nodules up to 0.8 cm, nonspecific. Non-contrast chest CT at 3-6 months is recommended. If the nodules are stable at time of repeat CT, then future CT at 18-24 months (from today's scan) is considered optional for low-risk patients, but is recommended for high-risk patients. This recommendation follows the consensus statement: Guidelines for Management of Incidental Pulmonary Nodules Detected on CT Images: From the Fleischner Society 2017; Radiology 2017; 284:228-243. 2.  Subcentimeter hepatic lesions likely cysts but incompletely characterized due to small size. Attention on follow-up imaging recommended. 3. 2.1 cm fusiform right common iliac artery aneurysm. Continued surveillance recommended. 4. Aortic Atherosclerosis (ICD10-I70.0) and Emphysema (ICD10-J43.9).    02/23/2017 Pathology Results    Diagnosis 1. Breast, simple mastectomy, Left Total - INVASIVE DUCTAL CARCINOMA, MULTIFOCAL, NOTTINGHAM GRADE 3/3 (5.3 CM, 3.5 CM) - INVASIVE CARCINOMA INVOLVES THE DERMIS - DUCTAL CARCINOMA IN SITU, INTERMEDIATE GRADE - HYALINIZED FIBROADENOMA - MARGINS UNINVOLVED BY CARCINOMA - NO CARCINOMA IDENTIFIED IN TWO LYMPH NODES (0/2) - SEE ONCOLOGY TABLE AND COMMENT BELOW 2. Lymph node, sentinel, biopsy, Left Axillary - NO CARCINOMA IDENTIFIED IN ONE LYMPH NODE (0/1) 3. Lymph node, sentinel, biopsy - NO CARCINOMA IDENTIFIED IN ONE LYMPH NODE (0/1) 4. Lymph node, sentinel, biopsy - NO CARCINOMA IDENTIFIED IN ONE LYMPH NODE (0/1) 5. Lymph node, sentinel, biopsy - NO CARCINOMA IDENTIFIED IN ONE LYMPH NODE (0/1) Microscopic Comment 1. BREAST, INVASIVE TUMOR Procedure: Simple mastectomy Laterality: Left Tumor Size: 5. 3 cm, 3.5 cm Histologic Type: Invasive carcinoma of no special type (ductal, not otherwise specified) Grade: Nottingham Grade 3 Tubular Differentiation: 3 Nuclear Pleomorphism: 3 Mitotic Count: 2 Ductal Carcinoma in Situ (DCIS): Present Extent of Tumor: Skin: Invasive carcinoma directly invades into the dermis or epidermis without skin ulceration Margins: Invasive carcinoma, distance from closest margin: 1.5 cm (posterior) DCIS, distance from closest margin: > 1 cm Regional Lymph Nodes: Number of Lymph Nodes Examined: 6 Number of Sentinel Lymph Nodes Examined: 4 Lymph Nodes with Macrometastases: 0 Lymph Nodes with Micrometastases: 0 Lymph Nodes with Isolated Tumor Cells: 0 Treatment effect: No known presurgical therapy Breast Prognostic  Profile: See Also ([id number]) Estrogen Receptor: Positive (100%, strong); Positive (100%, strong) Progesterone Receptor: Positive (50%,  strong); Positive (50%, strong) Her2: Positive (Ratio 2.80); Negative (Ratio 1.28) Ki-67: 70%; 60% Best tumor block for sendout testing: 1E Pathologic Stage Classification (pTNM, AJCC 8th Edition): Primary Tumor: mpT3 Regional Lymph Nodes: pN0 COMMENT: The two invasive carcinomas in the breast have slightly different morphologies. The larger lesion has a papillary and micropapillary pattern admixed with typical ductal carcinoma while the smaller lesion is more typical of a ductal lesion with linear arrays (E-cadherin performed on biopsy). There are foci within the larger lesion that are concerning for lymphovascular space invasion.     04/07/2017 -  Chemotherapy    weekly taxol and herceptin x12 weeks, 04/07/17-06/23/17. Then herceptin q3 weeks for total 1 year which was switched to Kadcyla on 07/14/17.    07/20/2017 - 09/03/2017 Radiation Therapy    She started adjuvant radiation on 07/20/17 with Dr. Lisbeth Renshaw and plans to complete on 09/03/17.     08/24/2017 -  Chemotherapy    maintenance Herceptin      CURRENT THERAPY maintenance Herceptin and anastrazole   INTERVAL HISTORY: Penny Huynh is a 85 y.o. female who is here for follow-up. Today, she is here with her husband. She is doing well and uses a cane to ambulate. She feels like her feet are burning and hurt when she walks.She also drops small objects. Her activity is limited due to the neuropathy. She takes gabapentin. She would like to go back to PT for stretching exercises. She denies headaches, double vision, confusion, or hallucinations. She sometimes experiences slurred speech.   Pertinent positives and negatives of review of systems are listed and detailed within the above HPI.  REVIEW OF SYSTEMS:   Constitutional: Denies fevers, chills or abnormal weight loss (+) intermittent slurred  speech Eyes: Denies blurriness of vision Ears, nose, mouth, throat, and face: Denies mucositis or sore throat Respiratory: Denies cough, dyspnea or wheezes Cardiovascular: Denies palpitation, chest discomfort or lower extremity swelling Gastrointestinal:  Denies nausea, heartburn or change in bowel habits Skin: Denies abnormal skin rashes Lymphatics: Denies new lymphadenopathy or easy bruising Neurological:(+) burning pain in her feet (+) drops objects Behavioral/Psych: Mood is stable, no new changes  All other systems were reviewed with the patient and are negative.  MEDICAL HISTORY:  Past Medical History:  Diagnosis Date  . Arthritis    "maybe a little in my left knee" (02/23/2017)  . Asthmatic bronchitis   . Cancer of left breast ([place])   . Coronary atherosclerosis    Minor nonobstructive CAD at cardiac catheterization 2009  . Dyslipidemia   . Femoral bruit 7/08   With normal ABIs  . Hypertension   . Lower extremity edema    Chronic  . Pneumonia ~ 1950   in 8th grade  . Type II diabetes mellitus ([place])     SURGICAL HISTORY: Past Surgical History:  Procedure Laterality Date  . APPENDECTOMY    . BREAST BIOPSY Left 02/2017  . CARDIAC CATHETERIZATION     "long time ago" (02/23/2017)  . MASTECTOMY COMPLETE / SIMPLE W/ SENTINEL NODE BIOPSY Left 02/23/2017   TOTAL MATECTOMY;  LEFT AXILLARY SENTINEL LYMPH NODE BIOPSY ERAS PATHWAY  . MASTECTOMY WITH RADIOACTIVE SEED GUIDED EXCISION AND AXILLARY SENTINEL LYMPH NODE BIOPSY Left 02/23/2017   Procedure: LEFT TOTAL MATECTOMY;  LEFT AXILLARY SENTINEL LYMPH NODE BIOPSY ERAS PATHWAY;  Surgeon: Excell Seltzer, MD;  Location: [facility];  Service: General;  Laterality: Left;  . PILONIDAL CYST EXCISION    . PORTA CATH INSERTION  02/23/2017  . PORTACATH PLACEMENT N/A  02/23/2017   Procedure: INSERTION PORT-A-CATH;  Surgeon: Excell Seltzer, MD;  Location: [place];  Service: General;  Laterality: N/A;  . SHOULDER ARTHROSCOPY W/ ROTATOR CUFF  REPAIR Right     I have reviewed the social history and family history with the patient and they are unchanged from previous note.  ALLERGIES:  has No Known Allergies.  MEDICATIONS:  Current Outpatient Medications  Medication Sig Dispense Refill  . amLODipine (NORVASC) 5 MG tablet TAKE 1 TABLET BY MOUTH ONCE DAILY 90 tablet 0  . anastrozole (ARIMIDEX) 1 MG tablet Take 1 tablet (1 mg total) by mouth daily. 90 tablet 1  . aspirin 81 MG EC tablet Take 81 mg by mouth at bedtime.     . chlorthalidone (HYGROTON) 25 MG tablet Take 25 mg by mouth daily.     . Coenzyme Q10 (COQ10) 100 MG CAPS Take 100 mg by mouth daily.      . fluorometholone (FML) 0.1 % ophthalmic suspension Place 1 drop into both eyes every 4 (four) hours.    Marland Kitchen latanoprost (XALATAN) 0.005 % ophthalmic solution Place 1 drop into both eyes at bedtime.    . lidocaine-prilocaine (EMLA) cream APPLY CREAM TOPICALLY TO AFFECTED AREA ONCE  3  . losartan (COZAAR) 50 MG tablet Take 1 tablet (50 mg total) by mouth 2 (two) times daily. 180 tablet 2  . metFORMIN (GLUCOPHAGE) 500 MG tablet Take 1,000 mg by mouth daily with breakfast.     . pregabalin (LYRICA) 75 MG capsule Take 2 capsules (150 mg total) by mouth 2 (two) times daily. For first week of treatment, take 61m two times daily 120 capsule 3  . vitamin B-12 (CYANOCOBALAMIN) 1000 MCG tablet Take 1,000 mcg by mouth daily.     No current facility-administered medications for this visit.    Facility-Administered Medications Ordered in Other Visits  Medication Dose Route Frequency Provider Last Rate Last Dose  . sodium chloride flush (NS) 0.9 % injection 10 mL  10 mL Intracatheter PRN FTruitt Merle MD   10 mL at 02/17/18 1603    PHYSICAL EXAMINATION: ECOG PERFORMANCE STATUS: 2 - Symptomatic, <50% confined to bed  Vitals:   02/17/18 1305  BP: (!) 176/76  Pulse: 62  Resp: 17  Temp: 98.3 F (36.8 C)  SpO2: 100%   Filed Weights   02/17/18 1305  Weight: 170 lb 11.2 oz (77.4 kg)     GENERAL:alert, no distress and comfortable (+) uses cane SKIN: skin color, texture, turgor are normal, no rashes or significant lesions EYES: normal, Conjunctiva are pink and non-injected, sclera clear OROPHARYNX:no exudate, no erythema and lips, buccal mucosa, and tongue normal  NECK: supple, thyroid normal size, non-tender, without nodularity LYMPH:  no palpable lymphadenopathy in the cervical, axillary or inguinal LUNGS: clear to auscultation and percussion with normal breathing effort HEART: regular rate & rhythm and no murmurs and no lower extremity edema ABDOMEN:abdomen soft, non-tender and normal bowel sounds Musculoskeletal:no cyanosis of digits and no clubbing  NEURO: alert & oriented x 3 with fluent speech, no focal motor/sensory deficits Breast :(+) radiation related hyperpigmentation. No new masses or skin or nipple changes  LABORATORY DATA:  I have reviewed the data as listed CBC Latest Ref Rng & Units 02/17/2018 01/27/2018 01/05/2018  WBC 4.0 - 10.5 K/uL 3.2(L) 3.9(L) 2.7(L)  Hemoglobin 12.0 - 15.0 g/dL 11.5(L) 11.5(L) 11.3(L)  Hematocrit 36.0 - 46.0 % 35.1(L) 36.0 34.8(L)  Platelets 150 - 400 K/uL 214 266 238     CMP Latest Ref  Rng & Units 02/17/2018 01/27/2018 01/05/2018  Glucose 70 - 99 mg/dL 136(H) 66(L) 132(H)  BUN 8 - 23 mg/dL _0 Creatinine 0.44 - 1.00 mg/dL 0.70 0.75 0.78  Sodium 135 - 145 mmol/L 129(L) 133(L) 129(L)  Potassium 3.5 - 5.1 mmol/L 3.8 4.0 3.8  Chloride 98 - 111 mmol/L 93(L) 97(L) 94(L)  CO2 22 - 32 mmol/L _1 Calcium 8.9 - 10.3 mg/dL 9.2 9.5 9.3  Total Protein 6.5 - 8.1 g/dL 6.9 6.9 6.9  Total Bilirubin 0.3 - 1.2 mg/dL 0.9 0.5 0.6  Alkaline Phos 38 - 126 U/L 72 80 71  AST 15 - 41 U/L _2 ALT 0 - 44 U/L _3 RADIOGRAPHIC STUDIES: I have personally reviewed the radiological images as listed and agreed with the findings in the report. No results found.   ASSESSMENT & PLAN:  HARTLEY WYKE is a 85 y.o. female  with history of  1.Multifocal invasive ductal carcinoma of the left breast of female;carcinoma of the upper-outer quadrant of left breast and female, ER/PR positive, HER-2 negative pT2N0M0, G2, Stage 1B; IDC of the upper-inner quadrant left breast, ER+/PR+/HER2+, pT3N0M0 G3, stage IB -Diagnosed in 2018. Treated with surgery first, followed by adjuvant chemo, radiation, and hormonal therapy.  She developed a severe and peripheral neuropathy from adjuvant Taxol and stopped earlier.  She did try TDM1, and stopped due to worsening neuropathy.  She is currently on maintenance Herceptin and adjuvant anastrozole. Tolerating well. -02/01/2018 ECHO showed LVEF 50% -Plan to complete 1 year maintenance Herceptin in March 2020 -Labs reviewed, CBC showed Hg 11.5 WBC 3.2k. CMP pending -She is tolerating treatment well and is clinically stable.  Her main complaint is to her moderate neuropathy. -She would like to go back to PT for stretching exercises. I will refer. -f/u in 9 weeks, before her last dose Herceptin. -She is tolerating letrozole well, will continue for 5 years -She had a mammogram earlier this week, which was unremarkable.  We will continue breast cancer surveillance  2. 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 Mickeal Skinner -She did not feel Lyrica is helping her, she may try to decrease the dose and come off it  3. HTN, CAD, DM -f/u with PCP   Plan  -refer to PT -We will proceed Herceptin today and continue every 3 weeks for 3 more treatments. -f/u in 9 weeks with labs and flush -I refiled anastrozole   No problem-specific Assessment & Plan notes found for this encounter.   Orders Placed This Encounter  Procedures  . Ambulatory referral to Physical Therapy    Referral Priority:   Routine    Referral Type:   Physical Medicine    Referral Reason:   Specialty Services Required    Requested Specialty:   Physical Therapy    Number  of Visits Requested:   1   All questions were answered. The patient knows to call the clinic with any problems, questions or concerns. No barriers to learning was detected. I spent 20 minutes counseling the patient face to face. The total time spent in the appointment was 25 minutes and more than 50% was on counseling and review of test results  I, Noor Dweik am acting as scribe for Dr. Truitt Merle.  I have reviewed the above documentation for accuracy and completeness, and I agree with the above.     Truitt Merle, MD 02/17/2018

## 2018-02-17 ENCOUNTER — Inpatient Hospital Stay (HOSPITAL_BASED_OUTPATIENT_CLINIC_OR_DEPARTMENT_OTHER): Payer: Medicare Other | Admitting: Hematology

## 2018-02-17 ENCOUNTER — Inpatient Hospital Stay: Payer: Medicare Other

## 2018-02-17 ENCOUNTER — Telehealth: Payer: Self-pay

## 2018-02-17 ENCOUNTER — Encounter: Payer: Self-pay | Admitting: Hematology

## 2018-02-17 ENCOUNTER — Inpatient Hospital Stay: Payer: Medicare Other | Attending: Hematology

## 2018-02-17 VITALS — BP 176/76 | HR 62 | Temp 98.3°F | Resp 17 | Ht 64.0 in | Wt 170.7 lb

## 2018-02-17 DIAGNOSIS — I10 Essential (primary) hypertension: Secondary | ICD-10-CM | POA: Insufficient documentation

## 2018-02-17 DIAGNOSIS — Z5112 Encounter for antineoplastic immunotherapy: Secondary | ICD-10-CM | POA: Diagnosis not present

## 2018-02-17 DIAGNOSIS — Z170 Estrogen receptor positive status [ER+]: Secondary | ICD-10-CM | POA: Diagnosis not present

## 2018-02-17 DIAGNOSIS — I2510 Atherosclerotic heart disease of native coronary artery without angina pectoris: Secondary | ICD-10-CM | POA: Diagnosis not present

## 2018-02-17 DIAGNOSIS — C50412 Malignant neoplasm of upper-outer quadrant of left female breast: Secondary | ICD-10-CM | POA: Diagnosis not present

## 2018-02-17 DIAGNOSIS — G620 Drug-induced polyneuropathy: Secondary | ICD-10-CM

## 2018-02-17 DIAGNOSIS — T451X5A Adverse effect of antineoplastic and immunosuppressive drugs, initial encounter: Secondary | ICD-10-CM | POA: Insufficient documentation

## 2018-02-17 DIAGNOSIS — Z9221 Personal history of antineoplastic chemotherapy: Secondary | ICD-10-CM

## 2018-02-17 DIAGNOSIS — C50212 Malignant neoplasm of upper-inner quadrant of left female breast: Secondary | ICD-10-CM | POA: Diagnosis not present

## 2018-02-17 DIAGNOSIS — Z794 Long term (current) use of insulin: Secondary | ICD-10-CM | POA: Insufficient documentation

## 2018-02-17 DIAGNOSIS — E1142 Type 2 diabetes mellitus with diabetic polyneuropathy: Secondary | ICD-10-CM | POA: Diagnosis not present

## 2018-02-17 DIAGNOSIS — Z923 Personal history of irradiation: Secondary | ICD-10-CM

## 2018-02-17 DIAGNOSIS — Z95828 Presence of other vascular implants and grafts: Secondary | ICD-10-CM

## 2018-02-17 LAB — CMP (CANCER CENTER ONLY)
ALBUMIN: 3.7 g/dL (ref 3.5–5.0)
ALT: 15 U/L (ref 0–44)
AST: 21 U/L (ref 15–41)
Alkaline Phosphatase: 72 U/L (ref 38–126)
Anion gap: 7 (ref 5–15)
BUN: 9 mg/dL (ref 8–23)
CO2: 29 mmol/L (ref 22–32)
Calcium: 9.2 mg/dL (ref 8.9–10.3)
Chloride: 93 mmol/L — ABNORMAL LOW (ref 98–111)
Creatinine: 0.7 mg/dL (ref 0.44–1.00)
GFR, Est AFR Am: >60 mL/min (ref >60)
GFR, Estimated: >60 mL/min (ref >60)
GLUCOSE: 136 mg/dL — AB (ref 70–99)
Potassium: 3.8 mmol/L (ref 3.5–5.1)
Sodium: 129 mmol/L — ABNORMAL LOW (ref 135–145)
Total Bilirubin: 0.9 mg/dL (ref 0.3–1.2)
Total Protein: 6.9 g/dL (ref 6.5–8.1)

## 2018-02-17 LAB — CBC WITH DIFFERENTIAL (CANCER CENTER ONLY)
Abs Immature Granulocytes: 0 10*3/uL (ref 0.00–0.07)
Basophils Absolute: 0 10*3/uL (ref 0.0–0.1)
Basophils Relative: 1 %
Eosinophils Absolute: 0.1 10*3/uL (ref 0.0–0.5)
Eosinophils Relative: 3 %
HCT: 35.1 % — ABNORMAL LOW (ref 36.0–46.0)
Hemoglobin: 11.5 g/dL — ABNORMAL LOW (ref 12.0–15.0)
Immature Granulocytes: 0 %
Lymphocytes Relative: 32 %
Lymphs Abs: 1 10*3/uL (ref 0.7–4.0)
MCH: 28.5 pg (ref 26.0–34.0)
MCHC: 32.8 g/dL (ref 30.0–36.0)
MCV: 86.9 fL (ref 80.0–100.0)
Monocytes Absolute: 0.4 10*3/uL (ref 0.1–1.0)
Monocytes Relative: 12 %
NRBC: 0 % (ref 0.0–0.2)
Neutro Abs: 1.7 10*3/uL (ref 1.7–7.7)
Neutrophils Relative %: 52 %
Platelet Count: 214 10*3/uL (ref 150–400)
RBC: 4.04 MIL/uL (ref 3.87–5.11)
RDW: 12.9 % (ref 11.5–15.5)
WBC Count: 3.2 10*3/uL — ABNORMAL LOW (ref 4.0–10.5)

## 2018-02-17 MED ORDER — DIPHENHYDRAMINE HCL 25 MG PO CAPS
ORAL_CAPSULE | ORAL | Status: AC
  Filled 2018-02-17: qty 2

## 2018-02-17 MED ORDER — HEPARIN SOD (PORK) LOCK FLUSH 100 UNIT/ML IV SOLN
500.0000 [IU] | Freq: Once | INTRAVENOUS | Status: AC | PRN
  Administered 2018-02-17: 500 [IU]
  Filled 2018-02-17: qty 5

## 2018-02-17 MED ORDER — ACETAMINOPHEN 325 MG PO TABS
650.0000 mg | ORAL_TABLET | Freq: Once | ORAL | Status: AC
  Administered 2018-02-17: 650 mg via ORAL

## 2018-02-17 MED ORDER — SODIUM CHLORIDE 0.9% FLUSH
10.0000 mL | INTRAVENOUS | Status: DC | PRN
  Administered 2018-02-17: 10 mL
  Filled 2018-02-17: qty 10

## 2018-02-17 MED ORDER — ANASTROZOLE 1 MG PO TABS: 1.0000 mg | ORAL_TABLET | Freq: Every day | ORAL | 1 refills | Status: DC

## 2018-02-17 MED ORDER — ACETAMINOPHEN 325 MG PO TABS
ORAL_TABLET | ORAL | Status: AC
  Filled 2018-02-17: qty 2

## 2018-02-17 MED ORDER — TRASTUZUMAB CHEMO 150 MG IV SOLR
450.0000 mg | Freq: Once | INTRAVENOUS | Status: AC
  Administered 2018-02-17: 450 mg via INTRAVENOUS
  Filled 2018-02-17: qty 21.43

## 2018-02-17 MED ORDER — DIPHENHYDRAMINE HCL 25 MG PO CAPS
50.0000 mg | ORAL_CAPSULE | Freq: Once | ORAL | Status: AC
  Administered 2018-02-17: 50 mg via ORAL

## 2018-02-17 MED ORDER — SODIUM CHLORIDE 0.9 % IV SOLN
Freq: Once | INTRAVENOUS | Status: AC
  Administered 2018-02-17: 15:00:00 via INTRAVENOUS
  Filled 2018-02-17: qty 250

## 2018-02-17 NOTE — Telephone Encounter (Signed)
Printed avs and calender of upcoming appointment. Per 1/16 los 

## 2018-02-17 NOTE — Patient Instructions (Signed)
[facility] Discharge Instructions for Patients Receiving Chemotherapy  Today you received the following chemotherapy agents Herceptin  To help prevent nausea and vomiting after your treatment, we encourage you to take your nausea medication as directed   If you develop nausea and vomiting that is not controlled by your nausea medication, call the clinic.   BELOW ARE SYMPTOMS THAT SHOULD BE REPORTED IMMEDIATELY:  *FEVER GREATER THAN 100.5 F  *CHILLS WITH OR WITHOUT FEVER  NAUSEA AND VOMITING THAT IS NOT CONTROLLED WITH YOUR NAUSEA MEDICATION  *UNUSUAL SHORTNESS OF BREATH  *UNUSUAL BRUISING OR BLEEDING  TENDERNESS IN MOUTH AND THROAT WITH OR WITHOUT PRESENCE OF ULCERS  *URINARY PROBLEMS  *BOWEL PROBLEMS  UNUSUAL RASH Items with * indicate a potential emergency and should be followed up as soon as possible.  Feel free to call the clinic should you have any questions or concerns. The clinic phone number is [phone].  Please show the CHEMO ALERT CARD at check-in to the Emergency Department and triage nurse.   

## 2018-02-17 NOTE — Telephone Encounter (Signed)
error opening 

## 2018-02-24 ENCOUNTER — Other Ambulatory Visit: Payer: Self-pay

## 2018-02-24 ENCOUNTER — Encounter: Payer: Self-pay | Admitting: Physical Therapy

## 2018-02-24 ENCOUNTER — Ambulatory Visit: Payer: Medicare Other | Attending: Hematology | Admitting: Physical Therapy

## 2018-02-24 DIAGNOSIS — M25512 Pain in left shoulder: Secondary | ICD-10-CM | POA: Diagnosis not present

## 2018-02-24 DIAGNOSIS — M6281 Muscle weakness (generalized): Secondary | ICD-10-CM

## 2018-02-24 DIAGNOSIS — M25612 Stiffness of left shoulder, not elsewhere classified: Secondary | ICD-10-CM | POA: Insufficient documentation

## 2018-02-24 DIAGNOSIS — G8929 Other chronic pain: Secondary | ICD-10-CM

## 2018-02-24 DIAGNOSIS — R293 Abnormal posture: Secondary | ICD-10-CM | POA: Diagnosis not present

## 2018-02-24 NOTE — Therapy (Signed)
[place], [place], [address] Phone: [phone]   Fax:  [phone]  Physical Therapy Evaluation  Patient Details  Name: Penny Huynh MRN: [id number] Date of Birth: 1933-06-07 Referring Provider (PT): [name]    Encounter Date: 02/24/2018  PT End of Session - 02/24/18 0937    Visit Number  1    Number of Visits  9    Date for PT Re-Evaluation  03/24/18    Authorization Type  Medicare    PT Start Time  0848    PT Stop Time  0930    PT Time Calculation (min)  42 min    Activity Tolerance  Patient tolerated treatment well    Behavior During Therapy  [facility] for tasks assessed/performed       Past Medical History:  Diagnosis Date  . Arthritis    "maybe a little in my left knee" (02/23/2017)  . Asthmatic bronchitis   . Cancer of left breast ([place])   . Coronary atherosclerosis    Minor nonobstructive CAD at cardiac catheterization 2009  . Dyslipidemia   . Femoral bruit 7/08   With normal ABIs  . Hypertension   . Lower extremity edema    Chronic  . Pneumonia ~ 1950   in 8th grade  . Type II diabetes mellitus ([place])     Past Surgical History:  Procedure Laterality Date  . APPENDECTOMY    . BREAST BIOPSY Left 02/2017  . CARDIAC CATHETERIZATION     "long time ago" (02/23/2017)  . MASTECTOMY COMPLETE / SIMPLE W/ SENTINEL NODE BIOPSY Left 02/23/2017   TOTAL MATECTOMY;  LEFT AXILLARY SENTINEL LYMPH NODE BIOPSY ERAS PATHWAY  . MASTECTOMY WITH RADIOACTIVE SEED GUIDED EXCISION AND AXILLARY SENTINEL LYMPH NODE BIOPSY Left 02/23/2017   Procedure: LEFT TOTAL MATECTOMY;  LEFT AXILLARY SENTINEL LYMPH NODE BIOPSY ERAS PATHWAY;  Surgeon: Excell Seltzer, MD;  Location: [place];  Service: General;  Laterality: Left;  . PILONIDAL CYST EXCISION    . PORTA CATH INSERTION  02/23/2017  . PORTACATH PLACEMENT N/A 02/23/2017   Procedure: INSERTION PORT-A-CATH;  Surgeon: Excell Seltzer, MD;  Location: [place];   Service: General;  Laterality: N/A;  . SHOULDER ARTHROSCOPY W/ ROTATOR CUFF REPAIR Right     There were no vitals filed for this visit.   Subjective Assessment - 02/24/18 0850    Subjective  I was taking some exercises at the fitness center. I was doing mostly the machines. I think that may have something to do with my arm. I did not keep up the stretches.     Pertinent History  Patient underwent a left mastectomy and sentinel node biopsy (6 negative nodes) on 02/23/17. She will begin chemotherapy 03/31/17 as long as drain is removed today. She has 2 different breast cancers. Both are ER/PR positive but one is HER2 negative and one is HER2 positive. History of right shoulder scope 20 years ago and has diabetes and hypertension, both of which are well controlled. 09/28/2017: pt has completed chemo and radiation completed 09/03/2017.  She is still getting herceptin infusions every 3 weeks.  She continues to struggle with chemotherapy induced peripheral neuropathy in her hands and feet.  Her hands feel cold and she has pain and numbness in her feet     Currently in Pain?  Yes    Pain Score  8     Pain Location  Foot    Pain Orientation  Right;Left    Pain Descriptors /  Indicators  Burning    Pain Type  Neuropathic pain    Pain Onset  More than a month ago    Pain Frequency  Constant    Aggravating Factors   nothing    Pain Relieving Factors  nothing    Effect of Pain on Daily Activities  can only wear certain shoes due to pain, unable to drive motor home    Multiple Pain Sites  No         [facility] PT Assessment - 02/24/18 0001      Assessment   Medical Diagnosis  s/p left mastectomy and SLNB    Referring Provider (PT)  [name]     Onset Date/Surgical Date  02/23/17    Hand Dominance  Right    Prior Therapy  was seen at this clinic in 2019 for left shoulder ROM and decreased balance      Precautions   Precautions  Other (comment)    Precaution Comments  Left arm lymphedema        Restrictions   Weight Bearing Restrictions  No      Balance Screen   Has the patient fallen in the past 6 months  Yes    How many times?  1   slipped in bathroom while wearing house shoes   Has the patient had a decrease in activity level because of a fear of falling?   No    Is the patient reluctant to leave their home because of a fear of falling?   No      Home Film/video editor residence    Living Arrangements  Spouse/significant other    Available Help at Discharge  Family    Type of [place]  Two level      Prior Function   Level of Independence  Needs assistance with ADLs   has an aide 4 hours a day, 2 days a week   Vocation  Retired    Leisure  pt is currently going to the gym she began 2 weeks ago and works with a Clinical research associate, she has been twice, she has been using the machines for UE and LE      Cognition   Overall Cognitive Status  Within Functional Limits for tasks assessed      Observation/Other Assessments   Observations  Pt comes into clinic walking with a cane with supportive athletic shoes on both feet.     Skin Integrity  pt has healed mastectomy scar with fibrotic tissue and increased tightness, she has increased edema in left lateral trunk    Quick DASH   --      Functional Tests   Functional tests  --      Single Leg Stance   Comments  --      Sit to Stand   Comments  --      Posture/Postural Control   Posture/Postural Control  Postural limitations    Postural Limitations  Rounded Shoulders;Forward head      AROM   Right Shoulder Flexion  150 Degrees    Right Shoulder ABduction  173 Degrees    Right Shoulder Internal Rotation  49 Degrees    Right Shoulder External Rotation  83 Degrees    Left Shoulder Flexion  130 Degrees    Left Shoulder ABduction  90 Degrees    Left Shoulder Internal Rotation  17 Degrees    Left Shoulder External Rotation  65 Degrees      Palpation   Palpation comment  cording observed  and palpated in left axilla and left elbow with left shoulder in abduction       LYMPHEDEMA/ONCOLOGY QUESTIONNAIRE - 02/24/18 0914      Type   Cancer Type  Left breast      Surgeries   Mastectomy Date  02/23/17    Sentinel Lymph Node Biopsy Date  02/23/17    Number Lymph Nodes Removed  6      Treatment   Active Chemotherapy Treatment  No    Past Chemotherapy Treatment  Yes    Active Radiation Treatment  No    Past Radiation Treatment  Yes    Current Hormone Treatment  No    Past Hormone Therapy  No      What other symptoms do you have   Are you Having Heaviness or Tightness  Yes    Are you having Pain  Yes    Are you having pitting edema  No    Is it Hard or Difficult finding clothes that fit  No    Do you have infections  No    Is there Decreased scar mobility  Yes    Stemmer Sign  No      Lymphedema Assessments   Lymphedema Assessments  Upper extremities      Right Upper Extremity Lymphedema   15 cm Proximal to Olecranon Process  29.1 cm    10 cm Proximal to Olecranon Process  --    Olecranon Process  25 cm    10 cm Proximal to Ulnar Styloid Process  20.5 cm    Just Proximal to Ulnar Styloid Process  17.6 cm    Across Hand at [employer]  19.9 cm    At [place] of 2nd Digit  6.4 cm      Left Upper Extremity Lymphedema   15 cm Proximal to Olecranon Process  31.4 cm    10 cm Proximal to Olecranon Process  --    Olecranon Process  25 cm    10 cm Proximal to Ulnar Styloid Process  21.2 cm    Just Proximal to Ulnar Styloid Process  16.9 cm    Across Hand at [employer]  21 cm    At [place] of 2nd Digit  6.4 cm          Quick Dash - 02/24/18 0001    Open a tight or new jar  Moderate difficulty    Do heavy household chores (wash walls, wash floors)  Severe difficulty    Carry a shopping bag or briefcase  No difficulty    Wash your back  Mild difficulty    Use a knife to cut food  Mild difficulty    Recreational activities in which you take some force or  impact through your arm, shoulder, or hand (golf, hammering, tennis)  Moderate difficulty    During the past week, to what extent has your arm, shoulder or hand problem interfered with your normal social activities with family, friends, neighbors, or groups?  Not at all    During the past week, to what extent has your arm, shoulder or hand problem limited your work or other regular daily activities  Modererately    Arm, shoulder, or hand pain.  Severe    Tingling (pins and needles) in your arm, shoulder, or hand  Moderate    Difficulty Sleeping  No difficulty  DASH Score  36.36 %        Objective measurements completed on examination: See above findings.              PT Education - 02/24/18 [phone]    Education provided  Yes    Education Details  lifting too much can lead to increased swelling, post op breast ROM exercises    Person(s) Educated  Patient    Methods  Explanation;Handout    Comprehension  Verbalized understanding       PT Short Term Goals - 10/26/17 0936      PT SHORT TERM GOAL #1   Title  Pt will decrease TUG score to 13 sec indicating an improvment in functional balance with decrased risk for fall     Baseline  14.14    Time  4    Period  Weeks    Status  Achieved        PT Long Term Goals - 02/24/18 7616      PT LONG TERM GOAL #1   Title  Pt will demonstrate 140 degrees of left shoulder flexion to allow her to reach overhead    Baseline  130    Time  4    Period  Weeks    Status  New    Target Date  03/24/18      PT LONG TERM GOAL #2   Title  Pt will demonstrate 130 degrees of left shoulder abduction to allow her to reach out to the side    Baseline  90    Time  4    Period  Weeks    Status  New    Target Date  03/24/18      PT LONG TERM GOAL #3   Title  Pt will be indepedent in a home exercise program for continued strengthening and stretching    Time  4    Period  Weeks    Status  New    Target Date  03/24/18      PT LONG TERM  GOAL #4   Title  Pt will receive a new compression sleeve and glove for long term management of LUE lymphedema    Time  4    Period  Weeks    Status  New    Target Date  03/24/18      PT LONG TERM GOAL #5   Title  Pt will be able to get off the floor without assistance so she can return to her exercise class at the gym    Time  4    Period  Weeks    Status  New    Target Date  03/24/18             Plan - 02/24/18 0942    Clinical Impression Statement  Pt presents to PT with decreased left shoulder ROM and pain. She was seen at this clinic last year for decreased left shoulder ROM and balance. She reports she did not continue with the streches after discharge and lost her ROM. She has been trying to go to the gym but had increased pain in left shoulder after two visits. Educated pt that she needs to improve her ROM before adding strengthening exercises. Pt also demonstrates increased swelling in left upper arm and lateral trunk. She has had her compression garments for over 6 months and would benefit from a new sleeve and glove for long term management of lymphedema. She is eager to return  to the gym and work with a Clinical research associate. Educated pt to only work on her LE currently at gym until her left shoulder ROM is improved to decrease risk of worsening lymphedema or injuring LUE. She would also like to return to her exercise class which requires her to get off the floor so she would like to be sure she can do that independently before discharge. Pt would benefit from skilled PT serivces to increase left shoulder ROM and decrease pain, improve strength, assist pt with getting appropriate compression garments and ensure she can get off floor independently to return to her exercise class.     History and Personal Factors relevant to plan of care:  diabetes, neuropathy in hands and feet, hx of radiation    Clinical Presentation  Stable    Clinical Presentation due to:  pt has completed all treatment     Clinical Decision Making  Low    Rehab Potential  Good    Clinical Impairments Affecting Rehab Potential  CIPN, hx of radiation    PT Treatment/Interventions  ADLs/Self Care Home Management;Therapeutic exercise;Therapeutic activities;Patient/family education;Manual techniques;Passive range of motion;Scar mobilization;Manual lymph drainage;Compression bandaging;Taping    PT Next Visit Plan  give supine cane exercises, PROM to left shoulder, scar massage to L mastectomy scar, pulleys, ball, prior to discharge make sure pt can get off floor independently    PT Home Exercise Plan  post op breast exercises    Consulted and Agree with Plan of Care  Patient       Patient will benefit from skilled therapeutic intervention in order to improve the following deficits and impairments:  Increased fascial restricitons, Pain, Decreased scar mobility, Postural dysfunction, Decreased range of motion, Decreased strength, Impaired UE functional use, Decreased knowledge of precautions, Increased edema  Visit Diagnosis: Stiffness of left shoulder, not elsewhere classified  Chronic left shoulder pain  Abnormal posture  Muscle weakness (generalized)     Problem List Patient Active Problem List   Diagnosis Date Noted  . Chemotherapy-induced peripheral neuropathy ([place]) 09/03/2017  . Port-A-Cath in place 04/14/2017  . Stage II breast cancer, left ([place]) 02/23/2017  . Carcinoma of upper-outer quadrant of left breast in female, estrogen receptor positive ([place]) 02/10/2017  . Carcinoma of upper-inner quadrant of left breast in female, estrogen receptor positive ([place]) 02/10/2017  . Chest pain 07/18/2015  . Hypokalemia 07/18/2015  . Vertigo 07/18/2015  . LBBB (left bundle branch block) 05/28/2009  . AODM 12/21/2007  . Essential hypertension, benign 12/21/2007  . DIASTOLIC DYSFUNCTION 37/94/4461  . FEMORAL BRUIT, RIGHT 12/21/2007  . DYSPNEA 12/21/2007    Allyson Sabal [facility] 02/24/2018, 9:49 AM  [place], [place], [address] Phone: [phone]   Fax:  [phone]  Name: Penny Huynh MRN: [id number] Date of Birth: 02-04-1933  Manus Gunning, PT 02/24/18 9:50 AM

## 2018-02-28 ENCOUNTER — Encounter: Payer: Self-pay | Admitting: Physical Therapy

## 2018-02-28 ENCOUNTER — Other Ambulatory Visit: Payer: Self-pay

## 2018-02-28 ENCOUNTER — Ambulatory Visit: Payer: Medicare Other | Admitting: Physical Therapy

## 2018-02-28 DIAGNOSIS — M25612 Stiffness of left shoulder, not elsewhere classified: Secondary | ICD-10-CM

## 2018-02-28 DIAGNOSIS — R293 Abnormal posture: Secondary | ICD-10-CM | POA: Diagnosis not present

## 2018-02-28 DIAGNOSIS — M25512 Pain in left shoulder: Secondary | ICD-10-CM | POA: Diagnosis not present

## 2018-02-28 DIAGNOSIS — M6281 Muscle weakness (generalized): Secondary | ICD-10-CM | POA: Diagnosis not present

## 2018-02-28 DIAGNOSIS — G8929 Other chronic pain: Secondary | ICD-10-CM | POA: Diagnosis not present

## 2018-02-28 NOTE — Patient Instructions (Signed)
Shoulder: Flexion (Supine)    With hands shoulder width apart, slowly lower dowel to floor behind head. Do not let elbows bend. Keep back flat. Hold _5-15___ seconds. Repeat _10___ times. Do __2__ sessions per day. CAUTION: Stretch slowly and gently.  Copyright  VHI. All rights reserved.  Shoulder: Abduction (Supine)    With left arm flat on floor, hold dowel in palm. Slowly move arm up to side of head by pushing with opposite arm. Do not let elbow bend. Hold _5-15___ seconds. Repeat _10___ times. Do _2___ sessions per day. CAUTION: Stretch slowly and gently.  Copyright  VHI. All rights reserved.   

## 2018-02-28 NOTE — Therapy (Signed)
[place], [place], [address] Phone: [phone]   Fax:  [phone]  Physical Therapy Treatment  Patient Details  Name: Penny Huynh MRN: [id number] Date of Birth: 1933/05/14 Referring Provider (PT): [name]    Encounter Date: 02/28/2018  PT End of Session - 02/28/18 1028    Visit Number  2    Number of Visits  9    Date for PT Re-Evaluation  03/24/18    Authorization Type  Medicare    PT Start Time  0938    PT Stop Time  1020    PT Time Calculation (min)  42 min    Activity Tolerance  Patient tolerated treatment well    Behavior During Therapy  [facility] for tasks assessed/performed       Past Medical History:  Diagnosis Date  . Arthritis    "maybe a little in my left knee" (02/23/2017)  . Asthmatic bronchitis   . Cancer of left breast ([place])   . Coronary atherosclerosis    Minor nonobstructive CAD at cardiac catheterization 2009  . Dyslipidemia   . Femoral bruit 7/08   With normal ABIs  . Hypertension   . Lower extremity edema    Chronic  . Pneumonia ~ 1950   in 8th grade  . Type II diabetes mellitus ([place])     Past Surgical History:  Procedure Laterality Date  . APPENDECTOMY    . BREAST BIOPSY Left 02/2017  . CARDIAC CATHETERIZATION     "long time ago" (02/23/2017)  . MASTECTOMY COMPLETE / SIMPLE W/ SENTINEL NODE BIOPSY Left 02/23/2017   TOTAL MATECTOMY;  LEFT AXILLARY SENTINEL LYMPH NODE BIOPSY ERAS PATHWAY  . MASTECTOMY WITH RADIOACTIVE SEED GUIDED EXCISION AND AXILLARY SENTINEL LYMPH NODE BIOPSY Left 02/23/2017   Procedure: LEFT TOTAL MATECTOMY;  LEFT AXILLARY SENTINEL LYMPH NODE BIOPSY ERAS PATHWAY;  Surgeon: Excell Seltzer, MD;  Location: [place];  Service: General;  Laterality: Left;  . PILONIDAL CYST EXCISION    . PORTA CATH INSERTION  02/23/2017  . PORTACATH PLACEMENT N/A 02/23/2017   Procedure: INSERTION PORT-A-CATH;  Surgeon: Excell Seltzer, MD;  Location: [place];   Service: General;  Laterality: N/A;  . SHOULDER ARTHROSCOPY W/ ROTATOR CUFF REPAIR Right     There were no vitals filed for this visit.  Subjective Assessment - 02/28/18 0938    Subjective  My shoulder is a little tight this morning.     Pertinent History  Patient underwent a left mastectomy and sentinel node biopsy (6 negative nodes) on 02/23/17. She will begin chemotherapy 03/31/17 as long as drain is removed today. She has 2 different breast cancers. Both are ER/PR positive but one is HER2 negative and one is HER2 positive. History of right shoulder scope 20 years ago and has diabetes and hypertension, both of which are well controlled. 09/28/2017: pt has completed chemo and radiation completed 09/03/2017.  She is still getting herceptin infusions every 3 weeks.  She continues to struggle with chemotherapy induced peripheral neuropathy in her hands and feet.  Her hands feel cold and she has pain and numbness in her feet     Patient Stated Goals  get shoulder motion back    Currently in Pain?  No/denies    Pain Score  0-No pain                       [facility] Adult PT Treatment/Exercise - 02/28/18 0001  Shoulder Exercises: Supine   Flexion  AAROM;Both   return demonstrated 2 reps   Other Supine Exercises  AAROM abduction with cane pt returned therapist demo x 2      Shoulder Exercises: Pulleys   Flexion  2 minutes   pt returned therapist demo   ABduction  2 minutes   pt returned therapist demo     Shoulder Exercises: Therapy Ball   Flexion  10 reps;Both   with stretch at top   ABduction  Left;10 reps   with stretch at top     Manual Therapy   Manual Therapy  Passive ROM;Soft tissue mobilization    Soft tissue mobilization  gently to tight left pec but pt has a lot of tenderness in this area so stopped    Passive ROM  to left shoulder in direction of flexion, abduction, horizontal abduction and ER with prolonged holds to pt's tolerance                   PT Long Term Goals - 02/24/18 1224      PT LONG TERM GOAL #1   Title  Pt will demonstrate 140 degrees of left shoulder flexion to allow her to reach overhead    Baseline  130    Time  4    Period  Weeks    Status  New    Target Date  03/24/18      PT LONG TERM GOAL #2   Title  Pt will demonstrate 130 degrees of left shoulder abduction to allow her to reach out to the side    Baseline  90    Time  4    Period  Weeks    Status  New    Target Date  03/24/18      PT LONG TERM GOAL #3   Title  Pt will be indepedent in a home exercise program for continued strengthening and stretching    Time  4    Period  Weeks    Status  New    Target Date  03/24/18      PT LONG TERM GOAL #4   Title  Pt will receive a new compression sleeve and glove for long term management of LUE lymphedema    Time  4    Period  Weeks    Status  New    Target Date  03/24/18      PT LONG TERM GOAL #5   Title  Pt will be able to get off the floor without assistance so she can return to her exercise class at the gym    Time  4    Period  Weeks    Status  New    Target Date  03/24/18            Plan - 02/28/18 1028    Clinical Impression Statement  Began AAROM and PROM today. Pt tolerated PROM well but did have a lot of tenderness in left pec with gentle soft tissue mobilization so this was stopped. She demonstrated significant increase in PROM from start to end of treatment session.     Rehab Potential  Good    Clinical Impairments Affecting Rehab Potential  CIPN, hx of radiation    PT Frequency  2x / week    PT Duration  4 weeks    PT Treatment/Interventions  ADLs/Self Care Home Management;Therapeutic exercise;Therapeutic activities;Patient/family education;Manual techniques;Passive range of motion;Scar mobilization;Manual lymph drainage;Compression bandaging;Taping    PT  Next Visit Plan  assess indep with supine cane exercises, PROM to left shoulder, scar massage to L  mastectomy scar, pulleys, ball, prior to discharge make sure pt can get off floor independently    PT Home Exercise Plan  post op breast exercises    Consulted and Agree with Plan of Care  Patient       Patient will benefit from skilled therapeutic intervention in order to improve the following deficits and impairments:  Increased fascial restricitons, Pain, Decreased scar mobility, Postural dysfunction, Decreased range of motion, Decreased strength, Impaired UE functional use, Decreased knowledge of precautions, Increased edema  Visit Diagnosis: Stiffness of left shoulder, not elsewhere classified  Chronic left shoulder pain  Abnormal posture     Problem List Patient Active Problem List   Diagnosis Date Noted  . Chemotherapy-induced peripheral neuropathy ([place]) 09/03/2017  . Port-A-Cath in place 04/14/2017  . Stage II breast cancer, left ([place]) 02/23/2017  . Carcinoma of upper-outer quadrant of left breast in female, estrogen receptor positive ([place]) 02/10/2017  . Carcinoma of upper-inner quadrant of left breast in female, estrogen receptor positive ([place]) 02/10/2017  . Chest pain 07/18/2015  . Hypokalemia 07/18/2015  . Vertigo 07/18/2015  . LBBB (left bundle branch block) 05/28/2009  . AODM 12/21/2007  . Essential hypertension, benign 12/21/2007  . DIASTOLIC DYSFUNCTION 12/92/9090  . FEMORAL BRUIT, RIGHT 12/21/2007  . DYSPNEA 12/21/2007    Allyson Sabal [facility] 02/28/2018, 10:30 AM  [place], [place], [address] Phone: [phone]   Fax:  [phone]  Name: ROSSELYN MARTHA MRN: [id number] Date of Birth: 1933-09-14  Manus Gunning, PT 02/28/18 10:30 AM

## 2018-02-28 NOTE — Addendum Note (Signed)
Addended by: Manus Gunning L on: 02/28/2018 10:34 AM   Modules accepted: Orders

## 2018-03-03 ENCOUNTER — Encounter: Payer: Self-pay | Admitting: Physical Therapy

## 2018-03-03 ENCOUNTER — Ambulatory Visit: Payer: Medicare Other | Admitting: Physical Therapy

## 2018-03-03 DIAGNOSIS — M25612 Stiffness of left shoulder, not elsewhere classified: Secondary | ICD-10-CM | POA: Diagnosis not present

## 2018-03-03 DIAGNOSIS — R293 Abnormal posture: Secondary | ICD-10-CM | POA: Diagnosis not present

## 2018-03-03 DIAGNOSIS — G8929 Other chronic pain: Secondary | ICD-10-CM

## 2018-03-03 DIAGNOSIS — M25512 Pain in left shoulder: Secondary | ICD-10-CM

## 2018-03-03 DIAGNOSIS — M6281 Muscle weakness (generalized): Secondary | ICD-10-CM | POA: Diagnosis not present

## 2018-03-03 NOTE — Therapy (Signed)
[place], [place], [address] Phone: [phone]   Fax:  [phone]  Physical Therapy Treatment  Patient Details  Name: Penny Huynh MRN: [id number] Date of Birth: [birth year] Referring Provider (PT): [name]    Encounter Date: 03/03/2018  PT End of Session - 03/03/18 1136    Visit Number  3    Number of Visits  9    Date for PT Re-Evaluation  03/24/18    Authorization Type  Medicare    PT Start Time  1020    PT Stop Time  1107    PT Time Calculation (min)  47 min    Activity Tolerance  Patient tolerated treatment well    Behavior During Therapy  [facility] for tasks assessed/performed       Past Medical History:  Diagnosis Date  . Arthritis    "maybe a little in my left knee" (02/23/2017)  . Asthmatic bronchitis   . Cancer of left breast ([place])   . Coronary atherosclerosis    Minor nonobstructive CAD at cardiac catheterization 2009  . Dyslipidemia   . Femoral bruit 7/08   With normal ABIs  . Hypertension   . Lower extremity edema    Chronic  . Pneumonia ~ 1950   in 8th grade  . Type II diabetes mellitus ([place])     Past Surgical History:  Procedure Laterality Date  . APPENDECTOMY    . BREAST BIOPSY Left 02/2017  . CARDIAC CATHETERIZATION     "long time ago" (02/23/2017)  . MASTECTOMY COMPLETE / SIMPLE W/ SENTINEL NODE BIOPSY Left 02/23/2017   TOTAL MATECTOMY;  LEFT AXILLARY SENTINEL LYMPH NODE BIOPSY ERAS PATHWAY  . MASTECTOMY WITH RADIOACTIVE SEED GUIDED EXCISION AND AXILLARY SENTINEL LYMPH NODE BIOPSY Left 02/23/2017   Procedure: LEFT TOTAL MATECTOMY;  LEFT AXILLARY SENTINEL LYMPH NODE BIOPSY ERAS PATHWAY;  Surgeon: Excell Seltzer, MD;  Location: [place];  Service: General;  Laterality: Left;  . PILONIDAL CYST EXCISION    . PORTA CATH INSERTION  02/23/2017  . PORTACATH PLACEMENT N/A 02/23/2017   Procedure: INSERTION PORT-A-CATH;  Surgeon: Excell Seltzer, MD;  Location: [place];   Service: General;  Laterality: N/A;  . SHOULDER ARTHROSCOPY W/ ROTATOR CUFF REPAIR Right     There were no vitals filed for this visit.  Subjective Assessment - 03/03/18 1126    Subjective  pt reports she has some soreness in left shoulder especially when it is raised up to about 90 degrees     Pertinent History  Patient underwent a left mastectomy and sentinel node biopsy (6 negative nodes) on 02/23/17. She had chemotherapy beginning 03/31/17.  She had 2 different breast cancers. Both are ER/PR positive but one is HER2 negative and one is HER2 positive. History of right shoulder scope 20 years ago and has diabetes and hypertension, both of which are well controlled. 09/28/2017: pt has completed chemo and radiation completed 09/03/2017.  She is still getting herceptin infusions every 3 weeks.  She continues to struggle with chemotherapy induced peripheral neuropathy in her hands and feet.  Her hands feel cold and she has pain and numbness in her feet     Patient Stated Goals  get shoulder motion back and return to community fitness programs     Currently in Pain?  No/denies   just little soreness with shoulder at 90 degrees of elevation  [facility] Adult PT Treatment/Exercise - 03/03/18 0001      Self-Care   Self-Care  Other Self-Care Comments    Other Self-Care Comments   pt given information about [employer] program that possibly will be at [facility].  Her compression bras that she got in November are too tight.  Contacted Tina at [employer] to [place] and they will give her an extension so the compression bra is able to be worn.       Exercises   Exercises  Neck;Shoulder;Lumbar;Knee/Hip      Neck Exercises: Seated   Other Seated Exercise   neck ROM with 1-2 sec hold in each direction       Knee/Hip Exercises: Standing   Heel Raises  Right;Left;10 reps    Wall Squat  1 set;10 reps    Wall Squat Limitations  verbal cues to keep knees apart and squeeze glutes at the  top       Knee/Hip Exercises: Sidelying   Hip ABduction  Strengthening;Right;Left;10 reps      Shoulder Exercises: Seated   Other Seated Exercises  UE ranger in sitting: on floor with hand in diagonals and circular, on wall, but pt with pain with shoulder elevated       Shoulder Exercises: Standing   Row  Strengthening;Right;Left;10 reps;Theraband    Theraband Level (Shoulder Row)  Level 2 (Red)    Row Limitations  elbows straight      Manual Therapy   Manual Therapy  Myofascial release;Scapular mobilization;Manual Traction    Soft tissue mobilization  gently to left upper trap     Myofascial Release  to anterior chest scar where adherent to chest     Scapular Mobilization  inferior glide 3 x 30 sec holds     Passive ROM  to left shoulder in direction of flexion, abduction, horizontal abduction and ER with prolonged holds to pt's tolerance                  PT Long Term Goals - 02/24/18 4034      PT LONG TERM GOAL #1   Title  Pt will demonstrate 140 degrees of left shoulder flexion to allow her to reach overhead    Baseline  130    Time  4    Period  Weeks    Status  New    Target Date  03/24/18      PT LONG TERM GOAL #2   Title  Pt will demonstrate 130 degrees of left shoulder abduction to allow her to reach out to the side    Baseline  90    Time  4    Period  Weeks    Status  New    Target Date  03/24/18      PT LONG TERM GOAL #3   Title  Pt will be indepedent in a home exercise program for continued strengthening and stretching    Time  4    Period  Weeks    Status  New    Target Date  03/24/18      PT LONG TERM GOAL #4   Title  Pt will receive a new compression sleeve and glove for long term management of LUE lymphedema    Time  4    Period  Weeks    Status  New    Target Date  03/24/18      PT LONG TERM GOAL #5   Title  Pt will be able to get off  the floor without assistance so she can return to her exercise class at the gym    Time  4     Period  Weeks    Status  New    Target Date  03/24/18            Plan - 03/03/18 1136    Clinical Impression Statement   Pt appears to have general deconditioning as well as shoulder decreased ROM and pain ( possible shoulder impingment inflammation ???) she wants to return to exercise so gave her information about the [employer] program.  Also, her prarie compression bra is too tight with visible compression lines indented into trunk.  She will go and get an extension today to see if she will be able to wear her bra as none of her current bras fit.     Clinical Impairments Affecting Rehab Potential  CIPN, hx of radiation    PT Frequency  2x / week    PT Duration  4 weeks    PT Next Visit Plan  check to see if extension on prairie bra will fit. assess indep with supine cane exercises, PROM to left shoulder, scar massage to L mastectomy scar, pulleys, ball, add LE exercse (lunges)  and prior to discharge make sure pt can get off floor independently    PT Home Exercise Plan  post op breast exercises, [id number] ( wall squats, heel raises, sidelying hip abduciton )        Patient will benefit from skilled therapeutic intervention in order to improve the following deficits and impairments:  Increased fascial restricitons, Pain, Decreased scar mobility, Postural dysfunction, Decreased range of motion, Decreased strength, Impaired UE functional use, Decreased knowledge of precautions, Increased edema  Visit Diagnosis: Stiffness of left shoulder, not elsewhere classified  Chronic left shoulder pain  Abnormal posture  Muscle weakness (generalized)     Problem List Patient Active Problem List   Diagnosis Date Noted  . Chemotherapy-induced peripheral neuropathy ([place]) 09/03/2017  . Port-A-Cath in place 04/14/2017  . Stage II breast cancer, left ([place]) 02/23/2017  . Carcinoma of upper-outer quadrant of left breast in female, estrogen receptor positive ([place]) 02/10/2017  . Carcinoma of  upper-inner quadrant of left breast in female, estrogen receptor positive ([place]) 02/10/2017  . Chest pain 07/18/2015  . Hypokalemia 07/18/2015  . Vertigo 07/18/2015  . LBBB (left bundle branch block) 05/28/2009  . AODM 12/21/2007  . Essential hypertension, benign 12/21/2007  . DIASTOLIC DYSFUNCTION 40/98/1191  . FEMORAL BRUIT, RIGHT 12/21/2007  . DYSPNEA 12/21/2007   Donato Heinz. Owens Shark PT  Norwood Levo 03/03/2018, 11:44 AM  [place], [place], [address] Phone: [phone]   Fax:  [phone]  Name: CHARMIAN FORBIS MRN: [id number] Date of Birth: [birth year]

## 2018-03-07 ENCOUNTER — Ambulatory Visit: Payer: Medicare Other | Attending: Hematology

## 2018-03-07 ENCOUNTER — Telehealth: Payer: Self-pay | Admitting: *Deleted

## 2018-03-07 DIAGNOSIS — R293 Abnormal posture: Secondary | ICD-10-CM | POA: Diagnosis not present

## 2018-03-07 DIAGNOSIS — G8929 Other chronic pain: Secondary | ICD-10-CM

## 2018-03-07 DIAGNOSIS — M6281 Muscle weakness (generalized): Secondary | ICD-10-CM | POA: Diagnosis not present

## 2018-03-07 DIAGNOSIS — M25612 Stiffness of left shoulder, not elsewhere classified: Secondary | ICD-10-CM | POA: Diagnosis not present

## 2018-03-07 DIAGNOSIS — M25512 Pain in left shoulder: Secondary | ICD-10-CM | POA: Insufficient documentation

## 2018-03-07 NOTE — Telephone Encounter (Signed)
LM for patient on home phone to r/s 03/10/2018 appointment with Dr Mickeal Skinner to possibly 03/09/2018 or later in the morning on 03/10/2018 due to MD late arrival.  LM pending call back.

## 2018-03-07 NOTE — Therapy (Signed)
[place], [place], [address] Phone: [phone]   Fax:  [phone]  Physical Therapy Treatment  Patient Details  Name: Penny Huynh MRN: [id number] Date of Birth: [birth year] Referring Provider (PT): [name]    Encounter Date: 03/07/2018  PT End of Session - 03/07/18 1019    Visit Number  4    Number of Visits  9    Date for PT Re-Evaluation  03/24/18    Authorization Type  Medicare    PT Start Time  0935    PT Stop Time  1017    PT Time Calculation (min)  42 min    Activity Tolerance  Patient tolerated treatment well    Behavior During Therapy  [facility] for tasks assessed/performed       Past Medical History:  Diagnosis Date  . Arthritis    "maybe a little in my left knee" (02/23/2017)  . Asthmatic bronchitis   . Cancer of left breast ([place])   . Coronary atherosclerosis    Minor nonobstructive CAD at cardiac catheterization 2009  . Dyslipidemia   . Femoral bruit 7/08   With normal ABIs  . Hypertension   . Lower extremity edema    Chronic  . Pneumonia ~ 1950   in 8th grade  . Type II diabetes mellitus ([place])     Past Surgical History:  Procedure Laterality Date  . APPENDECTOMY    . BREAST BIOPSY Left 02/2017  . CARDIAC CATHETERIZATION     "long time ago" (02/23/2017)  . MASTECTOMY COMPLETE / SIMPLE W/ SENTINEL NODE BIOPSY Left 02/23/2017   TOTAL MATECTOMY;  LEFT AXILLARY SENTINEL LYMPH NODE BIOPSY ERAS PATHWAY  . MASTECTOMY WITH RADIOACTIVE SEED GUIDED EXCISION AND AXILLARY SENTINEL LYMPH NODE BIOPSY Left 02/23/2017   Procedure: LEFT TOTAL MATECTOMY;  LEFT AXILLARY SENTINEL LYMPH NODE BIOPSY ERAS PATHWAY;  Surgeon: Excell Seltzer, MD;  Location: [place];  Service: General;  Laterality: Left;  . PILONIDAL CYST EXCISION    . PORTA CATH INSERTION  02/23/2017  . PORTACATH PLACEMENT N/A 02/23/2017   Procedure: INSERTION PORT-A-CATH;  Surgeon: Excell Seltzer, MD;  Location: [place];   Service: General;  Laterality: N/A;  . SHOULDER ARTHROSCOPY W/ ROTATOR CUFF REPAIR Right     There were no vitals filed for this visit.  Subjective Assessment - 03/07/18 0939    Subjective  I can tell my Lt shoulder is doing better. My pain is definitely improving.     Pertinent History  Patient underwent a left mastectomy and sentinel node biopsy (6 negative nodes) on 02/23/17. She had chemotherapy beginning 03/31/17.  She had 2 different breast cancers. Both are ER/PR positive but one is HER2 negative and one is HER2 positive. History of right shoulder scope 20 years ago and has diabetes and hypertension, both of which are well controlled. 09/28/2017: pt has completed chemo and radiation completed 09/03/2017.  She is still getting herceptin infusions every 3 weeks.  She continues to struggle with chemotherapy induced peripheral neuropathy in her hands and feet.  Her hands feel cold and she has pain and numbness in her feet     Patient Stated Goals  get shoulder motion back and return to community fitness programs     Currently in Pain?  No/denies                       [facility] Adult PT Treatment/Exercise - 03/07/18 0001      Shoulder  Exercises: Supine   External Rotation  AAROM;Left;10 reps    Flexion  AAROM;Both;10 reps   with dowel   Flexion Limitations  VCs to decrease trunk ext at end of ROM    ABduction  AAROM;Left;10 reps    ABduction Limitations  within pain free ROM      Shoulder Exercises: Pulleys   Flexion  2 minutes    Flexion Limitations  Tactile cuing intially to decresae Lt scapula compensation, then pt able to return correct demo    ABduction  2 minutes    ABduction Limitations  VCs to decrease Lt scapular compensation and she did well with returning demo of this as well after cuing.      Shoulder Exercises: Therapy Ball   Flexion  10 reps;Both   Forward lean into end of stretch     Manual Therapy   Manual Therapy  Myofascial release;Scapular  mobilization;Manual Traction    Myofascial Release  to anterior chest scar where adherent to chest     Scapular Mobilization  inferior glide 3 x 30 sec holds     Passive ROM  to left shoulder in direction of flexion, abduction,and D2 with prolonged holds to pt's tolerance                  PT Long Term Goals - 02/24/18 8527      PT LONG TERM GOAL #1   Title  Pt will demonstrate 140 degrees of left shoulder flexion to allow her to reach overhead    Baseline  130    Time  4    Period  Weeks    Status  New    Target Date  03/24/18      PT LONG TERM GOAL #2   Title  Pt will demonstrate 130 degrees of left shoulder abduction to allow her to reach out to the side    Baseline  90    Time  4    Period  Weeks    Status  New    Target Date  03/24/18      PT LONG TERM GOAL #3   Title  Pt will be indepedent in a home exercise program for continued strengthening and stretching    Time  4    Period  Weeks    Status  New    Target Date  03/24/18      PT LONG TERM GOAL #4   Title  Pt will receive a new compression sleeve and glove for long term management of LUE lymphedema    Time  4    Period  Weeks    Status  New    Target Date  03/24/18      PT LONG TERM GOAL #5   Title  Pt will be able to get off the floor without assistance so she can return to her exercise class at the gym    Time  4    Period  Weeks    Status  New    Target Date  03/24/18            Plan - 03/07/18 1027    Clinical Impression Statement  Focused on manul therapy to Lt shoulder after a few AA/ROM exercises. Pt reports pan is improving and her ROM seems to be improving as well. She notes she can tell her AA/ROM is getting better with cane exercises as she is able to stretch arms closer to her bed now. Pt did get extender  piece for her compression bra but has yet to try it.     Rehab Potential  Good    Clinical Impairments Affecting Rehab Potential  CIPN, hx of radiation    PT Frequency  2x /  week    PT Duration  4 weeks    PT Treatment/Interventions  ADLs/Self Care Home Management;Therapeutic exercise;Therapeutic activities;Patient/family education;Manual techniques;Passive range of motion;Scar mobilization;Manual lymph drainage;Compression bandaging;Taping    PT Next Visit Plan  Cont PROM to left shoulder, scar massage to Lt mastectomy scar, pulleys, ball, add LE exercse (lunges)  and prior to discharge make sure pt can get off floor independently    Consulted and Agree with Plan of Care  Patient       Patient will benefit from skilled therapeutic intervention in order to improve the following deficits and impairments:  Increased fascial restricitons, Pain, Decreased scar mobility, Postural dysfunction, Decreased range of motion, Decreased strength, Impaired UE functional use, Decreased knowledge of precautions, Increased edema  Visit Diagnosis: Stiffness of left shoulder, not elsewhere classified  Chronic left shoulder pain  Abnormal posture  Muscle weakness (generalized)     Problem List Patient Active Problem List   Diagnosis Date Noted  . Chemotherapy-induced peripheral neuropathy ([place]) 09/03/2017  . Port-A-Cath in place 04/14/2017  . Stage II breast cancer, left ([place]) 02/23/2017  . Carcinoma of upper-outer quadrant of left breast in female, estrogen receptor positive ([place]) 02/10/2017  . Carcinoma of upper-inner quadrant of left breast in female, estrogen receptor positive ([place]) 02/10/2017  . Chest pain 07/18/2015  . Hypokalemia 07/18/2015  . Vertigo 07/18/2015  . LBBB (left bundle branch block) 05/28/2009  . AODM 12/21/2007  . Essential hypertension, benign 12/21/2007  . DIASTOLIC DYSFUNCTION 40/98/2867  . FEMORAL BRUIT, RIGHT 12/21/2007  . DYSPNEA 12/21/2007    Otelia Limes, PTA 03/07/2018, 10:31 AM  [place], [place], [address] Phone: [phone]   Fax:   [phone]  Name: Penny Huynh MRN: [id number] Date of Birth: 1934-01-06

## 2018-03-09 ENCOUNTER — Inpatient Hospital Stay: Payer: Medicare Other

## 2018-03-09 ENCOUNTER — Inpatient Hospital Stay: Payer: Medicare Other | Attending: Hematology

## 2018-03-09 ENCOUNTER — Inpatient Hospital Stay (HOSPITAL_BASED_OUTPATIENT_CLINIC_OR_DEPARTMENT_OTHER): Payer: Medicare Other | Admitting: Internal Medicine

## 2018-03-09 VITALS — BP 164/80 | HR 69 | Temp 98.7°F | Resp 17 | Wt 171.0 lb

## 2018-03-09 VITALS — BP 160/75 | HR 68 | Temp 98.4°F | Resp 17 | Ht 64.0 in | Wt 171.7 lb

## 2018-03-09 DIAGNOSIS — T451X5A Adverse effect of antineoplastic and immunosuppressive drugs, initial encounter: Principal | ICD-10-CM

## 2018-03-09 DIAGNOSIS — T451X5S Adverse effect of antineoplastic and immunosuppressive drugs, sequela: Secondary | ICD-10-CM | POA: Insufficient documentation

## 2018-03-09 DIAGNOSIS — Z87891 Personal history of nicotine dependence: Secondary | ICD-10-CM | POA: Insufficient documentation

## 2018-03-09 DIAGNOSIS — Z5112 Encounter for antineoplastic immunotherapy: Secondary | ICD-10-CM | POA: Insufficient documentation

## 2018-03-09 DIAGNOSIS — Z794 Long term (current) use of insulin: Secondary | ICD-10-CM | POA: Insufficient documentation

## 2018-03-09 DIAGNOSIS — G620 Drug-induced polyneuropathy: Secondary | ICD-10-CM

## 2018-03-09 DIAGNOSIS — E1142 Type 2 diabetes mellitus with diabetic polyneuropathy: Secondary | ICD-10-CM | POA: Diagnosis not present

## 2018-03-09 DIAGNOSIS — Z9221 Personal history of antineoplastic chemotherapy: Secondary | ICD-10-CM | POA: Insufficient documentation

## 2018-03-09 DIAGNOSIS — C50412 Malignant neoplasm of upper-outer quadrant of left female breast: Secondary | ICD-10-CM

## 2018-03-09 DIAGNOSIS — Z95828 Presence of other vascular implants and grafts: Secondary | ICD-10-CM

## 2018-03-09 DIAGNOSIS — E785 Hyperlipidemia, unspecified: Secondary | ICD-10-CM | POA: Diagnosis not present

## 2018-03-09 DIAGNOSIS — I2510 Atherosclerotic heart disease of native coronary artery without angina pectoris: Secondary | ICD-10-CM | POA: Diagnosis not present

## 2018-03-09 DIAGNOSIS — Z923 Personal history of irradiation: Secondary | ICD-10-CM | POA: Diagnosis not present

## 2018-03-09 DIAGNOSIS — C50212 Malignant neoplasm of upper-inner quadrant of left female breast: Secondary | ICD-10-CM

## 2018-03-09 DIAGNOSIS — Z7984 Long term (current) use of oral hypoglycemic drugs: Secondary | ICD-10-CM | POA: Diagnosis not present

## 2018-03-09 DIAGNOSIS — Z79899 Other long term (current) drug therapy: Secondary | ICD-10-CM | POA: Insufficient documentation

## 2018-03-09 DIAGNOSIS — I10 Essential (primary) hypertension: Secondary | ICD-10-CM | POA: Insufficient documentation

## 2018-03-09 DIAGNOSIS — Z170 Estrogen receptor positive status [ER+]: Secondary | ICD-10-CM

## 2018-03-09 DIAGNOSIS — Z7982 Long term (current) use of aspirin: Secondary | ICD-10-CM | POA: Diagnosis not present

## 2018-03-09 LAB — CBC WITH DIFFERENTIAL (CANCER CENTER ONLY)
Abs Immature Granulocytes: 0.01 10*3/uL (ref 0.00–0.07)
Basophils Absolute: 0 10*3/uL (ref 0.0–0.1)
Basophils Relative: 1 %
EOS PCT: 2 %
Eosinophils Absolute: 0.1 10*3/uL (ref 0.0–0.5)
HCT: 35.4 % — ABNORMAL LOW (ref 36.0–46.0)
Hemoglobin: 11.4 g/dL — ABNORMAL LOW (ref 12.0–15.0)
Immature Granulocytes: 0 %
Lymphocytes Relative: 27 %
Lymphs Abs: 0.8 10*3/uL (ref 0.7–4.0)
MCH: 28.6 pg (ref 26.0–34.0)
MCHC: 32.2 g/dL (ref 30.0–36.0)
MCV: 88.7 fL (ref 80.0–100.0)
Monocytes Absolute: 0.4 10*3/uL (ref 0.1–1.0)
Monocytes Relative: 13 %
Neutro Abs: 1.7 10*3/uL (ref 1.7–7.7)
Neutrophils Relative %: 57 %
Platelet Count: 232 10*3/uL (ref 150–400)
RBC: 3.99 MIL/uL (ref 3.87–5.11)
RDW: 13.3 % (ref 11.5–15.5)
WBC Count: 3 10*3/uL — ABNORMAL LOW (ref 4.0–10.5)
nRBC: 0 % (ref 0.0–0.2)

## 2018-03-09 LAB — CMP (CANCER CENTER ONLY)
ALT: 17 U/L (ref 0–44)
AST: 21 U/L (ref 15–41)
Albumin: 3.8 g/dL (ref 3.5–5.0)
Alkaline Phosphatase: 74 U/L (ref 38–126)
Anion gap: 8 (ref 5–15)
BUN: 10 mg/dL (ref 8–23)
CHLORIDE: 95 mmol/L — AB (ref 98–111)
CO2: 29 mmol/L (ref 22–32)
Calcium: 9.6 mg/dL (ref 8.9–10.3)
Creatinine: 0.78 mg/dL (ref 0.44–1.00)
GFR, Est AFR Am: >60 mL/min (ref >60)
Glucose, Bld: 137 mg/dL — ABNORMAL HIGH (ref 70–99)
POTASSIUM: 3.9 mmol/L (ref 3.5–5.1)
Sodium: 132 mmol/L — ABNORMAL LOW (ref 135–145)
Total Bilirubin: 0.9 mg/dL (ref 0.3–1.2)
Total Protein: 6.9 g/dL (ref 6.5–8.1)

## 2018-03-09 MED ORDER — SODIUM CHLORIDE 0.9 % IV SOLN
Freq: Once | INTRAVENOUS | Status: AC
  Administered 2018-03-09: 10:00:00 via INTRAVENOUS
  Filled 2018-03-09: qty 250

## 2018-03-09 MED ORDER — DIPHENHYDRAMINE HCL 25 MG PO CAPS
50.0000 mg | ORAL_CAPSULE | Freq: Once | ORAL | Status: AC
  Administered 2018-03-09: 50 mg via ORAL

## 2018-03-09 MED ORDER — TRASTUZUMAB CHEMO 150 MG IV SOLR
450.0000 mg | Freq: Once | INTRAVENOUS | Status: AC
  Administered 2018-03-09: 450 mg via INTRAVENOUS
  Filled 2018-03-09: qty 21.43

## 2018-03-09 MED ORDER — DIPHENHYDRAMINE HCL 25 MG PO CAPS
ORAL_CAPSULE | ORAL | Status: AC
  Filled 2018-03-09: qty 2

## 2018-03-09 MED ORDER — ACETAMINOPHEN 325 MG PO TABS
650.0000 mg | ORAL_TABLET | Freq: Once | ORAL | Status: AC
  Administered 2018-03-09: 650 mg via ORAL

## 2018-03-09 MED ORDER — HEPARIN SOD (PORK) LOCK FLUSH 100 UNIT/ML IV SOLN
500.0000 [IU] | Freq: Once | INTRAVENOUS | Status: AC | PRN
  Administered 2018-03-09: 500 [IU]
  Filled 2018-03-09: qty 5

## 2018-03-09 MED ORDER — SODIUM CHLORIDE 0.9% FLUSH
10.0000 mL | INTRAVENOUS | Status: DC | PRN
  Administered 2018-03-09: 10 mL
  Filled 2018-03-09: qty 10

## 2018-03-09 MED ORDER — TRAMADOL HCL 50 MG PO TABS: 50.0000 mg | ORAL_TABLET | Freq: Four times a day (QID) | ORAL | 0 refills | Status: DC | PRN

## 2018-03-09 MED ORDER — ACETAMINOPHEN 325 MG PO TABS
ORAL_TABLET | ORAL | Status: AC
  Filled 2018-03-09: qty 2

## 2018-03-09 NOTE — Progress Notes (Signed)
[facility] [place], [place] [address] [phone]   Interval Evaluation  Date of Service: 03/09/18 Patient Name: Penny Huynh Patient MRN: [id number] Patient DOB: October 22, 1933 Provider: Ventura Sellers, MD  Identifying Statement:  Penny Huynh is a 85 y.o. female with Chemotherapy-induced peripheral neuropathy ([place]) [G62.0, T45.1X5A]   Oncologic History: Oncology History   Cancer Staging Carcinoma of upper-inner quadrant of left breast in female, estrogen receptor positive ([place]) Staging form: Breast, AJCC 8th Edition - Clinical stage from 01/18/2017: Stage IB (cT2, cN0, cM0, G3, ER: Positive, PR: Positive, HER2: Positive) - Signed by Truitt Merle, MD on 02/10/2017 - Pathologic stage from 02/23/2017: Stage IB (pT3, pN0, cM0, G3, ER: Positive, PR: Positive, HER2: Positive) - Signed by Alla Feeling, NP on 03/16/2017  Carcinoma of upper-outer quadrant of left breast in female, estrogen receptor positive ([place]) Staging form: Breast, AJCC 8th Edition - Clinical stage from 01/18/2017: Stage IB (cT2, cN0, cM0, G2, ER: Positive, PR: Positive, HER2: Negative) - Unsigned - Pathologic stage from 02/23/2017: Stage IB (pT2, pN0(sn), cM0, G3, ER: Positive, PR: Positive, HER2: Negative) - Signed by Alla Feeling, NP on 03/16/2017       Carcinoma of upper-outer quadrant of left breast in female, estrogen receptor positive ([place])   01/13/2017 Mammogram    FINDINGS: In the left breast, a spiculated mass lies in the upper outer quadrant. In the medial posterior left breast, there is a larger lobulated mass. These masses correspond to the palpable abnormalities. No other discrete left breast masses.  In the right breast, there are no discrete masses. There are no areas of architectural distortion.  In both breasts there are scattered calcifications, rounded punctate, with a few other larger coarse dystrophic calcifications, without  significant change from the previous screening mammogram, which is dated 12/04/2009.  Mammographic images were processed with CAD.  On physical exam, there is a firm palpable mass in the upper outer left breast and another firm mass in the medial left breast.     01/13/2017 Breast US    Targeted ultrasound is performed, showing an irregular hypoechoic shadowing mass in the left breast at the 2:30 o'clock position, 5 cm the nipple, measuring 3.3 x 2.8 x 2.6 cm. In the 10:30 o'clock position of the left breast, 5 cm the nipple, there is irregular hypoechoic mass with somewhat more lobulated margins, corresponding to the lobulated mass seen mammographically. This measures 4.8 x 2.9 x 4.8 cm. Both masses show internal vascularity on color Doppler analysis.  Sonographic evaluation of the left axilla shows several nodes with thickened cortices. Status cortex measured is 5 mm. None of these lymph nodes appear enlarged but overall size criteria.  IMPRESSION: 1. Two masses in the left breast, 1 at the 2:30 o'clock position in the other at the 10:30 o'clock position, both highly suspicious for breast carcinoma. 2. Several abnormal left axillary lymph nodes with thickened cortices. 3. No evidence of right breast malignancy.     01/18/2017 Pathology Results    Diagnosis 1. Breast, left, needle core biopsy, 2:30 o'clock - INVASIVE MAMMARY CARCINOMA - MAMMARY CARCINOMA IN-SITU - SEE COMMENT 2. Lymph node, needle/core biopsy, left axilla - NO CARCINOMA IDENTIFIED - SEE COMMENT 3. Breast, left, needle core biopsy, 10:30 o'clock - INVASIVE DUCTAL CARCINOMA - SEE COMMENT  1. PROGNOSTIC INDICATORS Results: IMMUNOHISTOCHEMICAL AND MORPHOMETRIC ANALYSIS PERFORMED MANUALLY Estrogen Receptor: 100%, POSITIVE, STRONG STAINING INTENSITY Progesterone Receptor: 60%, POSITIVE, STRONG STAINING INTENSITY Proliferation  Marker Ki67: 60%  1. FLUORESCENCE IN-SITU HYBRIDIZATION Results: HER2  - NEGATIVE RATIO OF HER2/CEP17 SIGNALS 1.28 AVERAGE HER2 COPY NUMBER PER CELL 2.75  3. PROGNOSTIC INDICATORS Results: IMMUNOHISTOCHEMICAL AND MORPHOMETRIC ANALYSIS PERFORMED MANUALLY Estrogen Receptor: 100%, POSITIVE, STRONG STAINING INTENSITY Progesterone Receptor: 50%, POSITIVE, STRONG STAINING INTENSITY Proliferation Marker Ki67: 70%  3. FLUORESCENCE IN-SITU HYBRIDIZATION Results: HER2 - **POSITIVE** RATIO OF HER2/CEP17 SIGNALS 2.80 AVERAGE HER2 COPY NUMBER PER CELL 6.15  Microscopic Comment 1. The biopsy material shows an infiltrative proliferation of cells with large vesicular nuclei with inconspicuous nucleoli, arranged linearly and in small clusters. Based on the biopsy, the carcinoma appears Nottingham grade 2 of 3 and measures 0.9 cm in greatest linear extent. E-cadherin and prognostic markers (ER/PR/ki-67/HER2-FISH)are pending and will be reported in an addendum. Dr. Lyndon Code reviewed the case and agrees with the above diagnosis.    02/10/2017 Initial Diagnosis    Carcinoma of upper-outer quadrant of left breast in female, estrogen receptor positive ([place])    02/19/2017 Imaging    Bone Scan whole Body 02/19/17 IMPRESSION: Negative for evidence of bony metastatic disease.    02/19/2017 Imaging    CT CAP W Contrast 02/19/17 IMPRESSION: 1. Small right middle lobe pulmonary nodules up to 0.8 cm, nonspecific. Non-contrast chest CT at 3-6 months is recommended. If the nodules are stable at time of repeat CT, then future CT at 18-24 months (from today's scan) is considered optional for low-risk patients, but is recommended for high-risk patients. This recommendation follows the consensus statement: Guidelines for Management of Incidental Pulmonary Nodules Detected on CT Images: From the Fleischner Society 2017; Radiology 2017; 284:228-243. 2. Subcentimeter hepatic lesions likely cysts but incompletely characterized due to small size. Attention on follow-up imaging recommended. 3.  2.1 cm fusiform right common iliac artery aneurysm. Continued surveillance recommended. 4. Aortic Atherosclerosis (ICD10-I70.0) and Emphysema (ICD10-J43.9).    02/23/2017 Pathology Results    Diagnosis 1. Breast, simple mastectomy, Left Total - INVASIVE DUCTAL CARCINOMA, MULTIFOCAL, NOTTINGHAM GRADE 3/3 (5.3 CM, 3.5 CM) - INVASIVE CARCINOMA INVOLVES THE DERMIS - DUCTAL CARCINOMA IN SITU, INTERMEDIATE GRADE - HYALINIZED FIBROADENOMA - MARGINS UNINVOLVED BY CARCINOMA - NO CARCINOMA IDENTIFIED IN TWO LYMPH NODES (0/2) - SEE ONCOLOGY TABLE AND COMMENT BELOW 2. Lymph node, sentinel, biopsy, Left Axillary - NO CARCINOMA IDENTIFIED IN ONE LYMPH NODE (0/1) 3. Lymph node, sentinel, biopsy - NO CARCINOMA IDENTIFIED IN ONE LYMPH NODE (0/1) 4. Lymph node, sentinel, biopsy - NO CARCINOMA IDENTIFIED IN ONE LYMPH NODE (0/1) 5. Lymph node, sentinel, biopsy - NO CARCINOMA IDENTIFIED IN ONE LYMPH NODE (0/1) Microscopic Comment 1. BREAST, INVASIVE TUMOR Procedure: Simple mastectomy Laterality: Left Tumor Size: 5. 3 cm, 3.5 cm Histologic Type: Invasive carcinoma of no special type (ductal, not otherwise specified) Grade: Nottingham Grade 3 Tubular Differentiation: 3 Nuclear Pleomorphism: 3 Mitotic Count: 2 Ductal Carcinoma in Situ (DCIS): Present Extent of Tumor: Skin: Invasive carcinoma directly invades into the dermis or epidermis without skin ulceration Margins: Invasive carcinoma, distance from closest margin: 1.5 cm (posterior) DCIS, distance from closest margin: > 1 cm Regional Lymph Nodes: Number of Lymph Nodes Examined: 6 Number of Sentinel Lymph Nodes Examined: 4 Lymph Nodes with Macrometastases: 0 Lymph Nodes with Micrometastases: 0 Lymph Nodes with Isolated Tumor Cells: 0 Treatment effect: No known presurgical therapy Breast Prognostic Profile: See Also ([id number]) Estrogen Receptor: Positive (100%, strong); Positive (100%, strong) Progesterone Receptor: Positive (50%,  strong); Positive (50%, strong) Her2: Positive (Ratio 2.80); Negative (Ratio 1.28) Ki-67: 70%; 60% Best tumor block for sendout testing:  1E Pathologic Stage Classification (pTNM, AJCC 8th Edition): Primary Tumor: mpT3 Regional Lymph Nodes: pN0 COMMENT: The two invasive carcinomas in the breast have slightly different morphologies. The larger lesion has a papillary and micropapillary pattern admixed with typical ductal carcinoma while the smaller lesion is more typical of a ductal lesion with linear arrays (E-cadherin performed on biopsy). There are foci within the larger lesion that are concerning for lymphovascular space invasion.     04/07/2017 -  Chemotherapy    weekly taxol and herceptin x12 weeks, 04/07/17-06/23/17. Then herceptin q3 weeks for total 1 year which was switched to Kadcyla on 07/14/17.    07/20/2017 - 09/03/2017 Radiation Therapy    She started adjuvant radiation on 07/20/17 with Dr. Lisbeth Renshaw and plans to complete on 09/03/17.     08/24/2017 -  Chemotherapy    maintenance Herceptin      Interval History:  Penny Huynh presents today for neuropathy follow up. She describes no improvement in pain symptoms with Lyrica.  No improvement with the lidocaine patches, either.  At this point she is frustrated with dealing with constant discomfort in her feet and numbness in her hands.  No new neurologic symptoms.  H+P: presents today to discuss her neuropathy symptoms.  She describes painful burning affecting her feet and lower to mid-shins symmetrically.  This started sometime after taxol chemotherapy was started in March (continued through May) and has persisted or even progressed.  At this time she also describes feeling of numbness and tingling affecting her finger tips on both hands.  Her walking is impaired because of poor sensation and balance, she is requiring a walker fora ambulation.  There is also noted swelling of both feet.  She does acknowledge a milder version of these symptoms,  affecting the bottoms of her feet only, which were present prior to the onset of chemotherapy.  She has long-standing diagnosis of diabetes.  Presently she is receiving Herceptin and no further cytotoxic chemotherapy.  Taking Gabapentin 624m 3x day without efficacy.  Medications: Current Outpatient Medications on File Prior to Visit  Medication Sig Dispense Refill  . anastrozole (ARIMIDEX) 1 MG tablet Take 1 tablet (1 mg total) by mouth daily. 90 tablet 1  . aspirin 81 MG EC tablet Take 81 mg by mouth at bedtime.     . chlorthalidone (HYGROTON) 25 MG tablet Take 25 mg by mouth daily.     . Coenzyme Q10 (COQ10) 100 MG CAPS Take 100 mg by mouth daily.      . fluorometholone (FML) 0.1 % ophthalmic suspension Place 1 drop into both eyes every 4 (four) hours.    .Marland Kitchenlatanoprost (XALATAN) 0.005 % ophthalmic solution Place 1 drop into both eyes at bedtime.    . lidocaine-prilocaine (EMLA) cream APPLY CREAM TOPICALLY TO AFFECTED AREA ONCE  3  . losartan (COZAAR) 50 MG tablet Take 1 tablet (50 mg total) by mouth 2 (two) times daily. 180 tablet 2  . metFORMIN (GLUCOPHAGE) 500 MG tablet Take 1,000 mg by mouth daily with breakfast.     . pregabalin (LYRICA) 75 MG capsule Take 2 capsules (150 mg total) by mouth 2 (two) times daily. For first week of treatment, take 757mtwo times daily 120 capsule 3  . vitamin B-12 (CYANOCOBALAMIN) 1000 MCG tablet Take 1,000 mcg by mouth daily.    . Marland KitchenmLODipine (NORVASC) 5 MG tablet TAKE 1 TABLET BY MOUTH ONCE DAILY (Patient not taking: Reported on 03/09/2018) 90 tablet 0  . [DISCONTINUED] prochlorperazine (COMPAZINE) 10 MG  tablet Take 1 tablet (10 mg total) by mouth every 6 (six) hours as needed (Nausea or vomiting). 30 tablet 1   No current facility-administered medications on file prior to visit.     Allergies: No Known Allergies Past Medical History:  Past Medical History:  Diagnosis Date  . Arthritis    "maybe a little in my left knee" (02/23/2017)  . Asthmatic  bronchitis   . Cancer of left breast ([place])   . Coronary atherosclerosis    Minor nonobstructive CAD at cardiac catheterization 2009  . Dyslipidemia   . Femoral bruit 7/08   With normal ABIs  . Hypertension   . Lower extremity edema    Chronic  . Pneumonia ~ 1950   in 8th grade  . Type II diabetes mellitus ([place])    Past Surgical History:  Past Surgical History:  Procedure Laterality Date  . APPENDECTOMY    . BREAST BIOPSY Left 02/2017  . CARDIAC CATHETERIZATION     "long time ago" (02/23/2017)  . MASTECTOMY COMPLETE / SIMPLE W/ SENTINEL NODE BIOPSY Left 02/23/2017   TOTAL MATECTOMY;  LEFT AXILLARY SENTINEL LYMPH NODE BIOPSY ERAS PATHWAY  . MASTECTOMY WITH RADIOACTIVE SEED GUIDED EXCISION AND AXILLARY SENTINEL LYMPH NODE BIOPSY Left 02/23/2017   Procedure: LEFT TOTAL MATECTOMY;  LEFT AXILLARY SENTINEL LYMPH NODE BIOPSY ERAS PATHWAY;  Surgeon: Excell Seltzer, MD;  Location: [facility];  Service: General;  Laterality: Left;  . PILONIDAL CYST EXCISION    . PORTA CATH INSERTION  02/23/2017  . PORTACATH PLACEMENT N/A 02/23/2017   Procedure: INSERTION PORT-A-CATH;  Surgeon: Excell Seltzer, MD;  Location: [facility];  Service: General;  Laterality: N/A;  . SHOULDER ARTHROSCOPY W/ ROTATOR CUFF REPAIR Right    Social History:  Social History   Socioeconomic History  . Marital status: Married    Spouse name: Not on file  . Number of children: 2  . Years of education: Not on file  . Highest education level: Not on file  Occupational History  . Occupation: Counselling psychologist for [employer]    Comment: only works during tax season but not currently   . Occupation: in Dentist   . Occupation: retired Mining engineer of education     Comment: [employer]  . Financial resource strain: Not on file  . Food insecurity:    Worry: Not on file    Inability: Not on file  . Transportation needs:    Medical: Not on file    Non-medical: Not on file  Tobacco Use  . Smoking status: Former Smoker     Packs/day: 2.00    Years: 46.00    Pack years: 92.00    Types: Cigarettes    Start date: 11/09/1953    Last attempt to quit: 2000    Years since quitting: 20.1  . Smokeless tobacco: Never Used  Substance and Sexual Activity  . Alcohol use: Yes    Alcohol/week: 2.0 standard drinks    Types: 2 Glasses of wine per week  . Drug use: No  . Sexual activity: Not on file  Lifestyle  . Physical activity:    Days per week: Not on file    Minutes per session: Not on file  . Stress: Not on file  Relationships  . Social connections:    Talks on phone: Not on file    Gets together: Not on file    Attends religious service: Not on file    Active member of club or organization: Not  on file    Attends meetings of clubs or organizations: Not on file    Relationship status: Not on file  . Intimate partner violence:    Fear of current or ex partner: Not on file    Emotionally abused: Not on file    Physically abused: Not on file    Forced sexual activity: Not on file  Other Topics Concern  . Not on file  Social History Narrative   Lives in [place] with her husband.   Has 2 grown children.   10-25-17 Unable to ask abuse questions husband with her today.   Family History:  Family History  Problem Relation Age of Onset  . Diabetes Mother   . Stroke Father   . Cancer Father        unknown type     Review of Systems: Constitutional: Denies fevers, chills or abnormal weight loss Eyes: Denies blurriness of vision Ears, nose, mouth, throat, and face: Denies mucositis or sore throat Respiratory: Denies cough, dyspnea or wheezes Cardiovascular: Denies palpitation, chest discomfort or lower extremity swelling Gastrointestinal:  Denies nausea, constipation, diarrhea GU: Denies dysuria or incontinence Skin: Denies abnormal skin rashes Neurological: Per HPI Musculoskeletal: +knee pain Behavioral/Psych: Denies anxiety, disturbance in thought content, and mood instability   Physical  Exam: Vitals:   03/09/18 1208  BP: (!) 160/75  Pulse: 68  Resp: 17  Temp: 98.4 F (36.9 C)  SpO2: 98%   KPS: 60. General: Alert, cooperative, pleasant, in no acute distress Head: Craniotomy scar noted, dry and intact. EENT: No conjunctival injection or scleral icterus. Oral mucosa moist Lungs: Resp effort normal Cardiac: Regular rate and rhythm Abdomen: Soft, non-distended abdomen Skin: No rashes cyanosis or petechiae. Extremities: No clubbing or edema  Neurologic Exam: Mental Status: Awake, alert, attentive to examiner. Oriented to self and environment. Language is fluent with intact comprehension.  Cranial Nerves: Visual acuity is grossly normal. Visual fields are full. Extra-ocular movements intact. No ptosis. Face is symmetric, tongue midline. Motor: Tone and bulk are normal. Power is full in both arms and legs. Reflexes are decreased bilaterally, no pathologic reflexes present. Intact finger to nose bilaterally Sensory: Impaired to light touch and temp lower legs and fingertips Gait: Deferred   Labs: I have reviewed the data as listed    Component Value Date/Time   NA 132 (L) 03/09/2018 0840   K 3.9 03/09/2018 0840   CL 95 (L) 03/09/2018 0840   CO2 29 03/09/2018 0840   GLUCOSE 137 (H) 03/09/2018 0840   BUN 10 03/09/2018 0840   CREATININE 0.78 03/09/2018 0840   CALCIUM 9.6 03/09/2018 0840   PROT 6.9 03/09/2018 0840   ALBUMIN 3.8 03/09/2018 0840   AST 21 03/09/2018 0840   ALT 17 03/09/2018 0840   ALKPHOS 74 03/09/2018 0840   BILITOT 0.9 03/09/2018 0840   GFRNONAA >60 03/09/2018 0840   GFRAA >60 03/09/2018 0840   Lab Results  Component Value Date   WBC 3.0 (L) 03/09/2018   NEUTROABS 1.7 03/09/2018   HGB 11.4 (L) 03/09/2018   HCT 35.4 (L) 03/09/2018   MCV 88.7 03/09/2018   PLT 232 03/09/2018      Assessment/Plan 1. Chemotherapy-induced peripheral neuropathy [facility])  Ms. Warshawsky has a symmetric distal polyneuropathy which is secondary to taxol exposure  combined with likely pre-existing diabetic neuropathy.  There is evidence of both small and large fiber involvement.    She has failed multiple neuropathic pain drugs, including Gabapentin, Amytryptilline, Pregabalin, Lidocaine.  At this time  we recommend direct analgesia with Tramadol, dosed at 86m QID PRN.  We asked her to limit to 1-2x per day if possible.  We appreciate the opportunity to participate in the care of MAnsel Bong  We ask that she return in 2-3 months for further evaluation.  All questions were answered. The patient knows to call the clinic with any problems, questions or concerns. No barriers to learning were detected.  The total time spent in the encounter was 25 minutes and more than 50% was on counseling and review of test results   ZVentura Sellers MD Medical Director of Neuro-Oncology [facility] [place]02/05/20 12:13 PM

## 2018-03-09 NOTE — Patient Instructions (Signed)
[facility] Discharge Instructions for Patients Receiving Chemotherapy  Today you received the following chemotherapy agents Trastuzumab (HERCEPTIN).  To help prevent nausea and vomiting after your treatment, we encourage you to take your nausea medication as prescribed.   If you develop nausea and vomiting that is not controlled by your nausea medication, call the clinic.   BELOW ARE SYMPTOMS THAT SHOULD BE REPORTED IMMEDIATELY:  *FEVER GREATER THAN 100.5 F  *CHILLS WITH OR WITHOUT FEVER  NAUSEA AND VOMITING THAT IS NOT CONTROLLED WITH YOUR NAUSEA MEDICATION  *UNUSUAL SHORTNESS OF BREATH  *UNUSUAL BRUISING OR BLEEDING  TENDERNESS IN MOUTH AND THROAT WITH OR WITHOUT PRESENCE OF ULCERS  *URINARY PROBLEMS  *BOWEL PROBLEMS  UNUSUAL RASH Items with * indicate a potential emergency and should be followed up as soon as possible.  Feel free to call the clinic should you have any questions or concerns. The clinic phone number is [phone].  Please show the CHEMO ALERT CARD at check-in to the Emergency Department and triage nurse.   

## 2018-03-10 ENCOUNTER — Telehealth: Payer: Self-pay | Admitting: *Deleted

## 2018-03-10 ENCOUNTER — Ambulatory Visit: Payer: Medicare Other | Admitting: Physical Therapy

## 2018-03-10 ENCOUNTER — Ambulatory Visit: Payer: Medicare Other | Admitting: Internal Medicine

## 2018-03-10 ENCOUNTER — Other Ambulatory Visit: Payer: Self-pay

## 2018-03-10 ENCOUNTER — Encounter: Payer: Self-pay | Admitting: Physical Therapy

## 2018-03-10 DIAGNOSIS — G8929 Other chronic pain: Secondary | ICD-10-CM

## 2018-03-10 DIAGNOSIS — M25612 Stiffness of left shoulder, not elsewhere classified: Secondary | ICD-10-CM | POA: Diagnosis not present

## 2018-03-10 DIAGNOSIS — R293 Abnormal posture: Secondary | ICD-10-CM

## 2018-03-10 DIAGNOSIS — M25512 Pain in left shoulder: Secondary | ICD-10-CM | POA: Diagnosis not present

## 2018-03-10 DIAGNOSIS — M6281 Muscle weakness (generalized): Secondary | ICD-10-CM | POA: Diagnosis not present

## 2018-03-10 NOTE — Telephone Encounter (Signed)
Patient called to relay a message to Dr Mickeal Skinner in regards to the name of a medication she couldn't recall from yesterdays visit that helps with peripheral neuropathy.  Drug is named Metanx.  Relayed to Dr. Mickeal Skinner.  No new orders at this time.

## 2018-03-10 NOTE — Therapy (Signed)
[place], [place], [address] Phone: [phone]   Fax:  [phone]  Physical Therapy Treatment  Patient Details  Name: Penny Huynh MRN: [id number] Date of Birth: [birth year] Referring Provider (PT): [name]    Encounter Date: 03/10/2018  PT End of Session - 03/10/18 1150    Visit Number  5    Number of Visits  9    Date for PT Re-Evaluation  03/24/18    Authorization Type  Medicare    PT Start Time  1104    PT Stop Time  1148    PT Time Calculation (min)  44 min    Activity Tolerance  Patient tolerated treatment well    Behavior During Therapy  [facility] for tasks assessed/performed       Past Medical History:  Diagnosis Date  . Arthritis    "maybe a little in my left knee" (02/23/2017)  . Asthmatic bronchitis   . Cancer of left breast ([place])   . Coronary atherosclerosis    Minor nonobstructive CAD at cardiac catheterization 2009  . Dyslipidemia   . Femoral bruit 7/08   With normal ABIs  . Hypertension   . Lower extremity edema    Chronic  . Pneumonia ~ 1950   in 8th grade  . Type II diabetes mellitus ([place])     Past Surgical History:  Procedure Laterality Date  . APPENDECTOMY    . BREAST BIOPSY Left 02/2017  . CARDIAC CATHETERIZATION     "long time ago" (02/23/2017)  . MASTECTOMY COMPLETE / SIMPLE W/ SENTINEL NODE BIOPSY Left 02/23/2017   TOTAL MATECTOMY;  LEFT AXILLARY SENTINEL LYMPH NODE BIOPSY ERAS PATHWAY  . MASTECTOMY WITH RADIOACTIVE SEED GUIDED EXCISION AND AXILLARY SENTINEL LYMPH NODE BIOPSY Left 02/23/2017   Procedure: LEFT TOTAL MATECTOMY;  LEFT AXILLARY SENTINEL LYMPH NODE BIOPSY ERAS PATHWAY;  Surgeon: Excell Seltzer, MD;  Location: [place];  Service: General;  Laterality: Left;  . PILONIDAL CYST EXCISION    . PORTA CATH INSERTION  02/23/2017  . PORTACATH PLACEMENT N/A 02/23/2017   Procedure: INSERTION PORT-A-CATH;  Surgeon: Excell Seltzer, MD;  Location: [place];   Service: General;  Laterality: N/A;  . SHOULDER ARTHROSCOPY W/ ROTATOR CUFF REPAIR Right     There were no vitals filed for this visit.  Subjective Assessment - 03/10/18 1115    Subjective  My shoulder is feeling pretty good. I do have one area that catches.    Pertinent History  Patient underwent a left mastectomy and sentinel node biopsy (6 negative nodes) on 02/23/17. She had chemotherapy beginning 03/31/17.  She had 2 different breast cancers. Both are ER/PR positive but one is HER2 negative and one is HER2 positive. History of right shoulder scope 20 years ago and has diabetes and hypertension, both of which are well controlled. 09/28/2017: pt has completed chemo and radiation completed 09/03/2017.  She is still getting herceptin infusions every 3 weeks.  She continues to struggle with chemotherapy induced peripheral neuropathy in her hands and feet.  Her hands feel cold and she has pain and numbness in her feet     Patient Stated Goals  get shoulder motion back and return to community fitness programs     Currently in Pain?  No/denies    Pain Score  0-No pain                       [facility] Adult PT Treatment/Exercise - 03/10/18 0001  Shoulder Exercises: Pulleys   Flexion  2 minutes    Flexion Limitations  Tactile cuing intially to decresae Lt scapula compensation, then pt able to return correct demo    ABduction  2 minutes      Shoulder Exercises: Therapy Ball   Flexion  10 reps;Both   Forward lean into end of stretch     Manual Therapy   Manual Therapy  Myofascial release;Scapular mobilization;Manual Traction    Myofascial Release  to anterior chest scar where adherent to chest     Passive ROM  to left shoulder in direction of flexion, abduction,and D2 with prolonged holds to pt's tolerance                  PT Long Term Goals - 02/24/18 3646      PT LONG TERM GOAL #1   Title  Pt will demonstrate 140 degrees of left shoulder flexion to allow her to  reach overhead    Baseline  130    Time  4    Period  Weeks    Status  New    Target Date  03/24/18      PT LONG TERM GOAL #2   Title  Pt will demonstrate 130 degrees of left shoulder abduction to allow her to reach out to the side    Baseline  90    Time  4    Period  Weeks    Status  New    Target Date  03/24/18      PT LONG TERM GOAL #3   Title  Pt will be indepedent in a home exercise program for continued strengthening and stretching    Time  4    Period  Weeks    Status  New    Target Date  03/24/18      PT LONG TERM GOAL #4   Title  Pt will receive a new compression sleeve and glove for long term management of LUE lymphedema    Time  4    Period  Weeks    Status  New    Target Date  03/24/18      PT LONG TERM GOAL #5   Title  Pt will be able to get off the floor without assistance so she can return to her exercise class at the gym    Time  4    Period  Weeks    Status  New    Target Date  03/24/18            Plan - 03/10/18 1151    Clinical Impression Statement  Continued to focus on manual therapy to improve left shoulder ROM. Pt requires verbal cues during AAROM exercises to keep shoulder from hiking.     Rehab Potential  Good    Clinical Impairments Affecting Rehab Potential  CIPN, hx of radiation    PT Frequency  2x / week    PT Duration  4 weeks    PT Treatment/Interventions  ADLs/Self Care Home Management;Therapeutic exercise;Therapeutic activities;Patient/family education;Manual techniques;Passive range of motion;Scar mobilization;Manual lymph drainage;Compression bandaging;Taping    PT Next Visit Plan  Cont PROM to left shoulder, scar massage to Lt mastectomy scar, pulleys, ball, add LE exercse (lunges)  and prior to discharge make sure pt can get off floor independently    PT Home Exercise Plan  post op breast exercises, [id number] ( wall squats, heel raises, sidelying hip abduciton )     Consulted and Agree with  Plan of Care  Patient        Patient will benefit from skilled therapeutic intervention in order to improve the following deficits and impairments:  Increased fascial restricitons, Pain, Decreased scar mobility, Postural dysfunction, Decreased range of motion, Decreased strength, Impaired UE functional use, Decreased knowledge of precautions, Increased edema  Visit Diagnosis: Stiffness of left shoulder, not elsewhere classified  Chronic left shoulder pain  Abnormal posture     Problem List Patient Active Problem List   Diagnosis Date Noted  . Chemotherapy-induced peripheral neuropathy ([place]) 09/03/2017  . Port-A-Cath in place 04/14/2017  . Stage II breast cancer, left ([place]) 02/23/2017  . Carcinoma of upper-outer quadrant of left breast in female, estrogen receptor positive ([place]) 02/10/2017  . Carcinoma of upper-inner quadrant of left breast in female, estrogen receptor positive ([place]) 02/10/2017  . Chest pain 07/18/2015  . Hypokalemia 07/18/2015  . Vertigo 07/18/2015  . LBBB (left bundle branch block) 05/28/2009  . AODM 12/21/2007  . Essential hypertension, benign 12/21/2007  . DIASTOLIC DYSFUNCTION 77/41/2878  . FEMORAL BRUIT, RIGHT 12/21/2007  . DYSPNEA 12/21/2007    Allyson Sabal [facility] 03/10/2018, 12:05 PM  [place], [place], [address] Phone: [phone]   Fax:  [phone]  Name: Penny Huynh MRN: [id number] Date of Birth: 08/18/1933  Manus Gunning, PT 03/10/18 12:05 PM

## 2018-03-14 ENCOUNTER — Other Ambulatory Visit: Payer: Self-pay

## 2018-03-14 ENCOUNTER — Ambulatory Visit: Payer: Medicare Other | Admitting: Physical Therapy

## 2018-03-14 ENCOUNTER — Encounter: Payer: Self-pay | Admitting: Physical Therapy

## 2018-03-14 DIAGNOSIS — R293 Abnormal posture: Secondary | ICD-10-CM

## 2018-03-14 DIAGNOSIS — G8929 Other chronic pain: Secondary | ICD-10-CM | POA: Diagnosis not present

## 2018-03-14 DIAGNOSIS — M25612 Stiffness of left shoulder, not elsewhere classified: Secondary | ICD-10-CM

## 2018-03-14 DIAGNOSIS — M6281 Muscle weakness (generalized): Secondary | ICD-10-CM | POA: Diagnosis not present

## 2018-03-14 DIAGNOSIS — M25512 Pain in left shoulder: Secondary | ICD-10-CM

## 2018-03-14 NOTE — Therapy (Signed)
[place], [place], [address] Phone: [phone]   Fax:  [phone]  Physical Therapy Treatment  Patient Details  Name: Penny Huynh MRN: [id number] Date of Birth: 08-22-1933 Referring Provider (PT): [name]    Encounter Date: 03/14/2018  PT End of Session - 03/14/18 1300    Visit Number  6    Number of Visits  9    Date for PT Re-Evaluation  03/24/18    Authorization Type  Medicare    PT Start Time  [phone]   pt arrived late   PT Stop Time  1018    PT Time Calculation (min)  36 min    Activity Tolerance  Patient tolerated treatment well    Behavior During Therapy  [facility] for tasks assessed/performed       Past Medical History:  Diagnosis Date  . Arthritis    "maybe a little in my left knee" (02/23/2017)  . Asthmatic bronchitis   . Cancer of left breast ([place])   . Coronary atherosclerosis    Minor nonobstructive CAD at cardiac catheterization 2009  . Dyslipidemia   . Femoral bruit 7/08   With normal ABIs  . Hypertension   . Lower extremity edema    Chronic  . Pneumonia ~ 1950   in 8th grade  . Type II diabetes mellitus ([place])     Past Surgical History:  Procedure Laterality Date  . APPENDECTOMY    . BREAST BIOPSY Left 02/2017  . CARDIAC CATHETERIZATION     "long time ago" (02/23/2017)  . MASTECTOMY COMPLETE / SIMPLE W/ SENTINEL NODE BIOPSY Left 02/23/2017   TOTAL MATECTOMY;  LEFT AXILLARY SENTINEL LYMPH NODE BIOPSY ERAS PATHWAY  . MASTECTOMY WITH RADIOACTIVE SEED GUIDED EXCISION AND AXILLARY SENTINEL LYMPH NODE BIOPSY Left 02/23/2017   Procedure: LEFT TOTAL MATECTOMY;  LEFT AXILLARY SENTINEL LYMPH NODE BIOPSY ERAS PATHWAY;  Surgeon: Excell Seltzer, MD;  Location: [place];  Service: General;  Laterality: Left;  . PILONIDAL CYST EXCISION    . PORTA CATH INSERTION  02/23/2017  . PORTACATH PLACEMENT N/A 02/23/2017   Procedure: INSERTION PORT-A-CATH;  Surgeon: Excell Seltzer, MD;   Location: [place];  Service: General;  Laterality: N/A;  . SHOULDER ARTHROSCOPY W/ ROTATOR CUFF REPAIR Right     There were no vitals filed for this visit.  Subjective Assessment - 03/14/18 0942    Subjective  My shoulder is feeling pretty good today.     Pertinent History  Patient underwent a left mastectomy and sentinel node biopsy (6 negative nodes) on 02/23/17. She had chemotherapy beginning 03/31/17.  She had 2 different breast cancers. Both are ER/PR positive but one is HER2 negative and one is HER2 positive. History of right shoulder scope 20 years ago and has diabetes and hypertension, both of which are well controlled. 09/28/2017: pt has completed chemo and radiation completed 09/03/2017.  She is still getting herceptin infusions every 3 weeks.  She continues to struggle with chemotherapy induced peripheral neuropathy in her hands and feet.  Her hands feel cold and she has pain and numbness in her feet     Patient Stated Goals  get shoulder motion back and return to community fitness programs     Currently in Pain?  No/denies    Pain Score  0-No pain         [facility] PT Assessment - 03/14/18 0001      AROM   Left Shoulder Flexion  134 Degrees  Left Shoulder ABduction  106 Degrees                   [facility] Adult PT Treatment/Exercise - 03/14/18 0001      Shoulder Exercises: Pulleys   Flexion  2 minutes    Flexion Limitations  v/c to keep shoulder from elevating    ABduction  2 minutes      Shoulder Exercises: Therapy Ball   Flexion  10 reps;Both   Forward lean into end of stretch   Other Therapy Ball Exercises  Finger ladder: x 5 to left shoulder in abduction       Manual Therapy   Manual Therapy  Passive ROM    Passive ROM  to left shoulder in direction of flexion, abduction,and D2 with prolonged holds to pt's tolerance   with some prolonged holds to increase ROM                 PT Long Term Goals - 02/24/18 7741      PT LONG TERM GOAL #1   Title  Pt  will demonstrate 140 degrees of left shoulder flexion to allow her to reach overhead    Baseline  130    Time  4    Period  Weeks    Status  New    Target Date  03/24/18      PT LONG TERM GOAL #2   Title  Pt will demonstrate 130 degrees of left shoulder abduction to allow her to reach out to the side    Baseline  90    Time  4    Period  Weeks    Status  New    Target Date  03/24/18      PT LONG TERM GOAL #3   Title  Pt will be indepedent in a home exercise program for continued strengthening and stretching    Time  4    Period  Weeks    Status  New    Target Date  03/24/18      PT LONG TERM GOAL #4   Title  Pt will receive a new compression sleeve and glove for long term management of LUE lymphedema    Time  4    Period  Weeks    Status  New    Target Date  03/24/18      PT LONG TERM GOAL #5   Title  Pt will be able to get off the floor without assistance so she can return to her exercise class at the gym    Time  4    Period  Weeks    Status  New    Target Date  03/24/18            Plan - 03/14/18 1301    Clinical Impression Statement  Remeasured left shoulder ROM today and her left shoulder abduction is improving. Her flexion ROM is still limited. Continued with AAROM exercises and manual therapy to improve left shoulder ROM.     Rehab Potential  Good    Clinical Impairments Affecting Rehab Potential  CIPN, hx of radiation    PT Frequency  2x / week    PT Duration  4 weeks    PT Treatment/Interventions  ADLs/Self Care Home Management;Therapeutic exercise;Therapeutic activities;Patient/family education;Manual techniques;Passive range of motion;Scar mobilization;Manual lymph drainage;Compression bandaging;Taping    PT Next Visit Plan  Cont PROM to left shoulder, scar massage to Lt mastectomy scar, pulleys, ball, add LE exercse (lunges)  and prior to discharge make sure pt can get off floor independently    PT Home Exercise Plan  post op breast exercises, [id number] (  wall squats, heel raises, sidelying hip abduciton )     Consulted and Agree with Plan of Care  Patient       Patient will benefit from skilled therapeutic intervention in order to improve the following deficits and impairments:  Increased fascial restricitons, Pain, Decreased scar mobility, Postural dysfunction, Decreased range of motion, Decreased strength, Impaired UE functional use, Decreased knowledge of precautions, Increased edema  Visit Diagnosis: Stiffness of left shoulder, not elsewhere classified  Chronic left shoulder pain  Abnormal posture     Problem List Patient Active Problem List   Diagnosis Date Noted  . Chemotherapy-induced peripheral neuropathy ([place]) 09/03/2017  . Port-A-Cath in place 04/14/2017  . Stage II breast cancer, left ([place]) 02/23/2017  . Carcinoma of upper-outer quadrant of left breast in female, estrogen receptor positive ([place]) 02/10/2017  . Carcinoma of upper-inner quadrant of left breast in female, estrogen receptor positive ([place]) 02/10/2017  . Chest pain 07/18/2015  . Hypokalemia 07/18/2015  . Vertigo 07/18/2015  . LBBB (left bundle branch block) 05/28/2009  . AODM 12/21/2007  . Essential hypertension, benign 12/21/2007  . DIASTOLIC DYSFUNCTION 16/24/4695  . FEMORAL BRUIT, RIGHT 12/21/2007  . DYSPNEA 12/21/2007    Allyson Sabal [facility] 03/14/2018, 1:03 PM  [place], [place], [address] Phone: [phone]   Fax:  [phone]  Name: Penny Huynh MRN: [id number] Date of Birth: 08/26/1933  Manus Gunning, PT 03/14/18 1:03 PM

## 2018-03-17 ENCOUNTER — Ambulatory Visit: Payer: Medicare Other

## 2018-03-17 DIAGNOSIS — G8929 Other chronic pain: Secondary | ICD-10-CM | POA: Diagnosis not present

## 2018-03-17 DIAGNOSIS — M25512 Pain in left shoulder: Secondary | ICD-10-CM

## 2018-03-17 DIAGNOSIS — R293 Abnormal posture: Secondary | ICD-10-CM | POA: Diagnosis not present

## 2018-03-17 DIAGNOSIS — M6281 Muscle weakness (generalized): Secondary | ICD-10-CM

## 2018-03-17 DIAGNOSIS — M25612 Stiffness of left shoulder, not elsewhere classified: Secondary | ICD-10-CM | POA: Diagnosis not present

## 2018-03-17 NOTE — Therapy (Signed)
[place], [place], [address] Phone: [phone]   Fax:  [phone]  Physical Therapy Treatment  Patient Details  Name: Penny Huynh MRN: [id number] Date of Birth: Aug 27, 1933 Referring Provider (PT): [name]    Encounter Date: 03/17/2018  PT End of Session - 03/17/18 1019    Visit Number  7    Number of Visits  9    Date for PT Re-Evaluation  03/24/18    Authorization Type  Medicare    PT Start Time  0940    PT Stop Time  1019    PT Time Calculation (min)  39 min    Activity Tolerance  Patient tolerated treatment well    Behavior During Therapy  [facility] for tasks assessed/performed       Past Medical History:  Diagnosis Date  . Arthritis    "maybe a little in my left knee" (02/23/2017)  . Asthmatic bronchitis   . Cancer of left breast ([place])   . Coronary atherosclerosis    Minor nonobstructive CAD at cardiac catheterization 2009  . Dyslipidemia   . Femoral bruit 7/08   With normal ABIs  . Hypertension   . Lower extremity edema    Chronic  . Pneumonia ~ 1950   in 8th grade  . Type II diabetes mellitus ([place])     Past Surgical History:  Procedure Laterality Date  . APPENDECTOMY    . BREAST BIOPSY Left 02/2017  . CARDIAC CATHETERIZATION     "long time ago" (02/23/2017)  . MASTECTOMY COMPLETE / SIMPLE W/ SENTINEL NODE BIOPSY Left 02/23/2017   TOTAL MATECTOMY;  LEFT AXILLARY SENTINEL LYMPH NODE BIOPSY ERAS PATHWAY  . MASTECTOMY WITH RADIOACTIVE SEED GUIDED EXCISION AND AXILLARY SENTINEL LYMPH NODE BIOPSY Left 02/23/2017   Procedure: LEFT TOTAL MATECTOMY;  LEFT AXILLARY SENTINEL LYMPH NODE BIOPSY ERAS PATHWAY;  Surgeon: Excell Seltzer, MD;  Location: [place];  Service: General;  Laterality: Left;  . PILONIDAL CYST EXCISION    . PORTA CATH INSERTION  02/23/2017  . PORTACATH PLACEMENT N/A 02/23/2017   Procedure: INSERTION PORT-A-CATH;  Surgeon: Excell Seltzer, MD;  Location: [place];   Service: General;  Laterality: N/A;  . SHOULDER ARTHROSCOPY W/ ROTATOR CUFF REPAIR Right     There were no vitals filed for this visit.  Subjective Assessment - 03/17/18 0942    Subjective  I think my Lt shoulder is getting better. I still have that little catch that gives me pain into my upper arm but other than that my arm is doing better.     Pertinent History  Patient underwent a left mastectomy and sentinel node biopsy (6 negative nodes) on 02/23/17. She had chemotherapy beginning 03/31/17.  She had 2 different breast cancers. Both are ER/PR positive but one is HER2 negative and one is HER2 positive. History of right shoulder scope 20 years ago and has diabetes and hypertension, both of which are well controlled. 09/28/2017: pt has completed chemo and radiation completed 09/03/2017.  She is still getting herceptin infusions every 3 weeks.  She continues to struggle with chemotherapy induced peripheral neuropathy in her hands and feet.  Her hands feel cold and she has pain and numbness in her feet     Patient Stated Goals  get shoulder motion back and return to community fitness programs     Currently in Pain?  No/denies  [facility] Adult PT Treatment/Exercise - 03/17/18 0001      Shoulder Exercises: Standing   ABduction  AROM;Both;5 reps   "snow angel" with back against wall, 5 sec holds     Shoulder Exercises: Pulleys   Flexion  2 minutes    Flexion Limitations  VCs to keep shoulder depressed and explained how this can be contributing to "catch" she still has     ABduction  2 minutes      Shoulder Exercises: Therapy Ball   Flexion  10 reps;Both   1 lb on Lt wrist; forward lean into end of stretch   ABduction  Left;10 reps   1 lb on wrist; same side lean into end of stretch   ABduction Limitations  Pt able to return therapist demo after cuing for technique      Shoulder Exercises: ROM/Strengthening   Other ROM/Strengthening Exercises  Modified downward  dog on wall 5x, 5 sec holds with pt returning therapist demo      Manual Therapy   Manual Therapy  Passive ROM    Myofascial Release  To Lt axilla and UE pulling during P/ROM    Passive ROM  to left shoulder in direction of flexion, abduction,and D2 with prolonged holds to pt's tolerance                  PT Long Term Goals - 02/24/18 9735      PT LONG TERM GOAL #1   Title  Pt will demonstrate 140 degrees of left shoulder flexion to allow her to reach overhead    Baseline  130    Time  4    Period  Weeks    Status  New    Target Date  03/24/18      PT LONG TERM GOAL #2   Title  Pt will demonstrate 130 degrees of left shoulder abduction to allow her to reach out to the side    Baseline  90    Time  4    Period  Weeks    Status  New    Target Date  03/24/18      PT LONG TERM GOAL #3   Title  Pt will be indepedent in a home exercise program for continued strengthening and stretching    Time  4    Period  Weeks    Status  New    Target Date  03/24/18      PT LONG TERM GOAL #4   Title  Pt will receive a new compression sleeve and glove for long term management of LUE lymphedema    Time  4    Period  Weeks    Status  New    Target Date  03/24/18      PT LONG TERM GOAL #5   Title  Pt will be able to get off the floor without assistance so she can return to her exercise class at the gym    Time  4    Period  Weeks    Status  New    Target Date  03/24/18            Plan - 03/17/18 1020    Clinical Impression Statement  Continued with AA/ROM exercises and manual therapy focusing on improving end ROM. Pt reports feeling good stretches during session and feeling looser by end.     Rehab Potential  Good    Clinical Impairments Affecting Rehab Potential  CIPN, hx of radiation  PT Frequency  2x / week    PT Duration  4 weeks    PT Treatment/Interventions  ADLs/Self Care Home Management;Therapeutic exercise;Therapeutic activities;Patient/family  education;Manual techniques;Passive range of motion;Scar mobilization;Manual lymph drainage;Compression bandaging;Taping    PT Next Visit Plan  Cont PROM to left shoulder, scar massage to Lt mastectomy scar, pulleys, ball, add LE exercse (lunges)  and prior to discharge make sure pt can get off floor independently    Consulted and Agree with Plan of Care  Patient       Patient will benefit from skilled therapeutic intervention in order to improve the following deficits and impairments:  Increased fascial restricitons, Pain, Decreased scar mobility, Postural dysfunction, Decreased range of motion, Decreased strength, Impaired UE functional use, Decreased knowledge of precautions, Increased edema  Visit Diagnosis: Stiffness of left shoulder, not elsewhere classified  Chronic left shoulder pain  Abnormal posture  Muscle weakness (generalized)     Problem List Patient Active Problem List   Diagnosis Date Noted  . Chemotherapy-induced peripheral neuropathy ([place]) 09/03/2017  . Port-A-Cath in place 04/14/2017  . Stage II breast cancer, left ([place]) 02/23/2017  . Carcinoma of upper-outer quadrant of left breast in female, estrogen receptor positive ([place]) 02/10/2017  . Carcinoma of upper-inner quadrant of left breast in female, estrogen receptor positive ([place]) 02/10/2017  . Chest pain 07/18/2015  . Hypokalemia 07/18/2015  . Vertigo 07/18/2015  . LBBB (left bundle branch block) 05/28/2009  . AODM 12/21/2007  . Essential hypertension, benign 12/21/2007  . DIASTOLIC DYSFUNCTION 59/11/2888  . FEMORAL BRUIT, RIGHT 12/21/2007  . DYSPNEA 12/21/2007    Otelia Limes, PTA 03/17/2018, 10:22 AM  [place], [place], [address] Phone: [phone]   Fax:  [phone]  Name: Penny Huynh MRN: [id number] Date of Birth: 01-13-1934

## 2018-03-22 ENCOUNTER — Telehealth: Payer: Self-pay | Admitting: Hematology

## 2018-03-22 ENCOUNTER — Ambulatory Visit: Payer: Medicare Other | Admitting: Physical Therapy

## 2018-03-22 DIAGNOSIS — E119 Type 2 diabetes mellitus without complications: Secondary | ICD-10-CM | POA: Diagnosis not present

## 2018-03-22 DIAGNOSIS — Z0001 Encounter for general adult medical examination with abnormal findings: Secondary | ICD-10-CM | POA: Diagnosis not present

## 2018-03-22 DIAGNOSIS — I10 Essential (primary) hypertension: Secondary | ICD-10-CM | POA: Diagnosis not present

## 2018-03-22 DIAGNOSIS — E1165 Type 2 diabetes mellitus with hyperglycemia: Secondary | ICD-10-CM | POA: Diagnosis not present

## 2018-03-22 DIAGNOSIS — E1140 Type 2 diabetes mellitus with diabetic neuropathy, unspecified: Secondary | ICD-10-CM | POA: Diagnosis not present

## 2018-03-22 DIAGNOSIS — Z6831 Body mass index (BMI) 31.0-31.9, adult: Secondary | ICD-10-CM | POA: Diagnosis not present

## 2018-03-22 DIAGNOSIS — E6609 Other obesity due to excess calories: Secondary | ICD-10-CM | POA: Insufficient documentation

## 2018-03-22 DIAGNOSIS — Z13220 Encounter for screening for lipoid disorders: Secondary | ICD-10-CM | POA: Diagnosis not present

## 2018-03-22 NOTE — Telephone Encounter (Signed)
Called patient per scheduling voicemail, patient wanted to reschedule her appt for 2/27.  Patient stated she will keep the appt because on the 25th we are over capacity that day, and also she could not do the 26th or the 28th.

## 2018-03-25 ENCOUNTER — Ambulatory Visit: Payer: Medicare Other | Admitting: Physical Therapy

## 2018-03-28 ENCOUNTER — Encounter: Payer: Self-pay | Admitting: Physical Therapy

## 2018-03-28 ENCOUNTER — Ambulatory Visit: Payer: Medicare Other | Admitting: Physical Therapy

## 2018-03-28 ENCOUNTER — Telehealth: Payer: Self-pay | Admitting: Hematology

## 2018-03-28 ENCOUNTER — Other Ambulatory Visit: Payer: Self-pay

## 2018-03-28 DIAGNOSIS — R293 Abnormal posture: Secondary | ICD-10-CM | POA: Diagnosis not present

## 2018-03-28 DIAGNOSIS — M25612 Stiffness of left shoulder, not elsewhere classified: Secondary | ICD-10-CM

## 2018-03-28 DIAGNOSIS — M6281 Muscle weakness (generalized): Secondary | ICD-10-CM | POA: Diagnosis not present

## 2018-03-28 DIAGNOSIS — G8929 Other chronic pain: Secondary | ICD-10-CM | POA: Diagnosis not present

## 2018-03-28 DIAGNOSIS — M25512 Pain in left shoulder: Secondary | ICD-10-CM | POA: Diagnosis not present

## 2018-03-28 NOTE — Telephone Encounter (Signed)
Patient walk in to reschedule 2/27 appt, will be out of town, per [name] ok to reschedule. Moved 2/27 to 3/3. Gave patient calendar

## 2018-03-28 NOTE — Therapy (Signed)
[place], [place], [address] Phone: [phone]   Fax:  [phone]  Physical Therapy Treatment  Patient Details  Name: Penny Huynh MRN: [id number] Date of Birth: 07-Jul-1933 Referring Provider (PT): [name]    Encounter Date: 03/28/2018  PT End of Session - 03/28/18 1203    Visit Number  8    Number of Visits  17    Date for PT Re-Evaluation  04/25/18    Authorization Type  Medicare    PT Start Time  1020    PT Stop Time  1101    PT Time Calculation (min)  41 min    Activity Tolerance  Patient tolerated treatment well    Behavior During Therapy  [facility] for tasks assessed/performed       Past Medical History:  Diagnosis Date  . Arthritis    "maybe a little in my left knee" (02/23/2017)  . Asthmatic bronchitis   . Cancer of left breast ([place])   . Coronary atherosclerosis    Minor nonobstructive CAD at cardiac catheterization 2009  . Dyslipidemia   . Femoral bruit 7/08   With normal ABIs  . Hypertension   . Lower extremity edema    Chronic  . Pneumonia ~ 1950   in 8th grade  . Type II diabetes mellitus ([place])     Past Surgical History:  Procedure Laterality Date  . APPENDECTOMY    . BREAST BIOPSY Left 02/2017  . CARDIAC CATHETERIZATION     "long time ago" (02/23/2017)  . MASTECTOMY COMPLETE / SIMPLE W/ SENTINEL NODE BIOPSY Left 02/23/2017   TOTAL MATECTOMY;  LEFT AXILLARY SENTINEL LYMPH NODE BIOPSY ERAS PATHWAY  . MASTECTOMY WITH RADIOACTIVE SEED GUIDED EXCISION AND AXILLARY SENTINEL LYMPH NODE BIOPSY Left 02/23/2017   Procedure: LEFT TOTAL MATECTOMY;  LEFT AXILLARY SENTINEL LYMPH NODE BIOPSY ERAS PATHWAY;  Surgeon: Excell Seltzer, MD;  Location: [facility];  Service: General;  Laterality: Left;  . PILONIDAL CYST EXCISION    . PORTA CATH INSERTION  02/23/2017  . PORTACATH PLACEMENT N/A 02/23/2017   Procedure: INSERTION PORT-A-CATH;  Surgeon: Excell Seltzer, MD;  Location: [facility];   Service: General;  Laterality: N/A;  . SHOULDER ARTHROSCOPY W/ ROTATOR CUFF REPAIR Right     There were no vitals filed for this visit.  Subjective Assessment - 03/28/18 1023    Subjective  My shoulder is doing pretty good. I got a little bit of soreness across my chest that I think I got from reaching.     Pertinent History  Patient underwent a left mastectomy and sentinel node biopsy (6 negative nodes) on 02/23/17. She had chemotherapy beginning 03/31/17.  She had 2 different breast cancers. Both are ER/PR positive but one is HER2 negative and one is HER2 positive. History of right shoulder scope 20 years ago and has diabetes and hypertension, both of which are well controlled. 09/28/2017: pt has completed chemo and radiation completed 09/03/2017.  She is still getting herceptin infusions every 3 weeks.  She continues to struggle with chemotherapy induced peripheral neuropathy in her hands and feet.  Her hands feel cold and she has pain and numbness in her feet     Patient Stated Goals  get shoulder motion back and return to community fitness programs     Currently in Pain?  No/denies    Pain Score  0-No pain         [facility] PT Assessment - 03/28/18 0001  AROM   Left Shoulder Flexion  142 Degrees    Left Shoulder ABduction  120 Degrees                   [facility] Adult PT Treatment/Exercise - 03/28/18 0001      Manual Therapy   Manual Therapy  Passive ROM    Soft tissue mobilization  across left chest in area of tightness    Myofascial Release  To Lt axilla and UE pulling during P/ROM    Passive ROM  to left shoulder in direction of flexion, abduction,and D2 with prolonged holds to pt's tolerance                  PT Long Term Goals - 03/28/18 1024      PT LONG TERM GOAL #1   Title  Pt will demonstrate 140 degrees of left shoulder flexion to allow her to reach overhead    Baseline  130, 03/28/18- 142 degrees     Time  4    Period  Weeks    Status  Achieved       PT LONG TERM GOAL #2   Title  Pt will demonstrate 130 degrees of left shoulder abduction to allow her to reach out to the side    Baseline  90, 03/28/18- 120    Time  4    Period  Weeks    Status  On-going      PT LONG TERM GOAL #3   Title  Pt will be indepedent in a home exercise program for continued strengthening and stretching    Time  4    Period  Weeks    Status  New      PT LONG TERM GOAL #4   Title  Pt will receive a new compression sleeve and glove for long term management of LUE lymphedema    Baseline  03/28/18- pt reports she needs to go pick it up    Time  4    Period  Weeks    Status  On-going      PT LONG TERM GOAL #5   Title  Pt will be able to get off the floor without assistance so she can return to her exercise class at the gym    Baseline  03/28/18- pt is now able to get up off the floor without assistance as demonstrated today    Time  4    Period  Weeks    Status  Achieved            Plan - 03/28/18 1203    Clinical Impression Statement  Assessed pts progress towards goals in therapy today. Pt reports she no longer needs to work on LE strengthening because she has been doing this at the gym. She has met her shoulder flexion ROM goal and is progressing towards her abduction ROM goal. She still has tightness across her left pec. She is now able to get up from the floor without assistance. Pt would benefit from additional skilled PT services to improve right shoulder abduction ROM and continue to progress pt towards independence with a home exercise program.     Rehab Potential  Good    Clinical Impairments Affecting Rehab Potential  CIPN, hx of radiation    PT Frequency  2x / week    PT Duration  4 weeks    PT Treatment/Interventions  ADLs/Self Care Home Management;Therapeutic exercise;Therapeutic activities;Patient/family education;Manual techniques;Passive range of motion;Scar mobilization;Manual lymph drainage;Compression  bandaging;Taping    PT Next  Visit Plan  Cont PROM to left shoulder, scar massage to Lt mastectomy scar, pulleys, ball,    PT Home Exercise Plan  post op breast exercises, [id number] ( wall squats, heel raises, sidelying hip abduciton )     Consulted and Agree with Plan of Care  Patient       Patient will benefit from skilled therapeutic intervention in order to improve the following deficits and impairments:  Increased fascial restricitons, Pain, Decreased scar mobility, Postural dysfunction, Decreased range of motion, Decreased strength, Impaired UE functional use, Decreased knowledge of precautions, Increased edema  Visit Diagnosis: Stiffness of left shoulder, not elsewhere classified  Chronic left shoulder pain  Abnormal posture  Muscle weakness (generalized)     Problem List Patient Active Problem List   Diagnosis Date Noted  . Chemotherapy-induced peripheral neuropathy ([place]) 09/03/2017  . Port-A-Cath in place 04/14/2017  . Stage II breast cancer, left ([place]) 02/23/2017  . Carcinoma of upper-outer quadrant of left breast in female, estrogen receptor positive ([place]) 02/10/2017  . Carcinoma of upper-inner quadrant of left breast in female, estrogen receptor positive ([place]) 02/10/2017  . Chest pain 07/18/2015  . Hypokalemia 07/18/2015  . Vertigo 07/18/2015  . LBBB (left bundle branch block) 05/28/2009  . AODM 12/21/2007  . Essential hypertension, benign 12/21/2007  . DIASTOLIC DYSFUNCTION 47/15/9539  . FEMORAL BRUIT, RIGHT 12/21/2007  . DYSPNEA 12/21/2007    Allyson Sabal [facility] 03/28/2018, 12:07 PM  [place], [place], [address] Phone: [phone]   Fax:  [phone]  Name: TONIYA ROZAR MRN: [id number] Date of Birth: 09-09-1933  Manus Gunning, PT 03/28/18 12:08 PM

## 2018-03-31 ENCOUNTER — Ambulatory Visit: Payer: Medicare Other

## 2018-03-31 ENCOUNTER — Other Ambulatory Visit: Payer: Medicare Other

## 2018-04-01 ENCOUNTER — Encounter: Payer: Medicare Other | Admitting: Physical Therapy

## 2018-04-05 ENCOUNTER — Encounter: Payer: Self-pay | Admitting: Physical Therapy

## 2018-04-05 ENCOUNTER — Inpatient Hospital Stay: Payer: Medicare Other | Attending: Hematology

## 2018-04-05 ENCOUNTER — Ambulatory Visit: Payer: Medicare Other | Attending: Hematology | Admitting: Physical Therapy

## 2018-04-05 ENCOUNTER — Other Ambulatory Visit: Payer: Self-pay

## 2018-04-05 ENCOUNTER — Inpatient Hospital Stay: Payer: Medicare Other

## 2018-04-05 DIAGNOSIS — R2681 Unsteadiness on feet: Secondary | ICD-10-CM | POA: Insufficient documentation

## 2018-04-05 DIAGNOSIS — I2510 Atherosclerotic heart disease of native coronary artery without angina pectoris: Secondary | ICD-10-CM | POA: Insufficient documentation

## 2018-04-05 DIAGNOSIS — Z794 Long term (current) use of insulin: Secondary | ICD-10-CM | POA: Diagnosis not present

## 2018-04-05 DIAGNOSIS — G620 Drug-induced polyneuropathy: Secondary | ICD-10-CM | POA: Diagnosis not present

## 2018-04-05 DIAGNOSIS — Z923 Personal history of irradiation: Secondary | ICD-10-CM | POA: Insufficient documentation

## 2018-04-05 DIAGNOSIS — M25612 Stiffness of left shoulder, not elsewhere classified: Secondary | ICD-10-CM | POA: Diagnosis not present

## 2018-04-05 DIAGNOSIS — E785 Hyperlipidemia, unspecified: Secondary | ICD-10-CM | POA: Insufficient documentation

## 2018-04-05 DIAGNOSIS — M25512 Pain in left shoulder: Secondary | ICD-10-CM | POA: Insufficient documentation

## 2018-04-05 DIAGNOSIS — T451X5S Adverse effect of antineoplastic and immunosuppressive drugs, sequela: Secondary | ICD-10-CM | POA: Insufficient documentation

## 2018-04-05 DIAGNOSIS — Z9221 Personal history of antineoplastic chemotherapy: Secondary | ICD-10-CM | POA: Diagnosis not present

## 2018-04-05 DIAGNOSIS — R293 Abnormal posture: Secondary | ICD-10-CM | POA: Diagnosis not present

## 2018-04-05 DIAGNOSIS — C50412 Malignant neoplasm of upper-outer quadrant of left female breast: Secondary | ICD-10-CM | POA: Insufficient documentation

## 2018-04-05 DIAGNOSIS — E1142 Type 2 diabetes mellitus with diabetic polyneuropathy: Secondary | ICD-10-CM | POA: Diagnosis not present

## 2018-04-05 DIAGNOSIS — Z7984 Long term (current) use of oral hypoglycemic drugs: Secondary | ICD-10-CM | POA: Diagnosis not present

## 2018-04-05 DIAGNOSIS — C50212 Malignant neoplasm of upper-inner quadrant of left female breast: Secondary | ICD-10-CM

## 2018-04-05 DIAGNOSIS — Z87891 Personal history of nicotine dependence: Secondary | ICD-10-CM | POA: Diagnosis not present

## 2018-04-05 DIAGNOSIS — I10 Essential (primary) hypertension: Secondary | ICD-10-CM | POA: Insufficient documentation

## 2018-04-05 DIAGNOSIS — Z7982 Long term (current) use of aspirin: Secondary | ICD-10-CM | POA: Diagnosis not present

## 2018-04-05 DIAGNOSIS — Z5112 Encounter for antineoplastic immunotherapy: Secondary | ICD-10-CM | POA: Insufficient documentation

## 2018-04-05 DIAGNOSIS — G8929 Other chronic pain: Secondary | ICD-10-CM | POA: Insufficient documentation

## 2018-04-05 DIAGNOSIS — Z170 Estrogen receptor positive status [ER+]: Secondary | ICD-10-CM | POA: Insufficient documentation

## 2018-04-05 DIAGNOSIS — Z79899 Other long term (current) drug therapy: Secondary | ICD-10-CM | POA: Diagnosis not present

## 2018-04-05 DIAGNOSIS — R209 Unspecified disturbances of skin sensation: Secondary | ICD-10-CM | POA: Diagnosis not present

## 2018-04-05 DIAGNOSIS — M6281 Muscle weakness (generalized): Secondary | ICD-10-CM | POA: Insufficient documentation

## 2018-04-05 DIAGNOSIS — Z95828 Presence of other vascular implants and grafts: Secondary | ICD-10-CM

## 2018-04-05 LAB — CMP (CANCER CENTER ONLY)
ALT: 15 U/L (ref 0–44)
ANION GAP: 9 (ref 5–15)
AST: 20 U/L (ref 15–41)
Albumin: 3.8 g/dL (ref 3.5–5.0)
Alkaline Phosphatase: 72 U/L (ref 38–126)
BUN: 13 mg/dL (ref 8–23)
CO2: 27 mmol/L (ref 22–32)
Calcium: 9.5 mg/dL (ref 8.9–10.3)
Chloride: 97 mmol/L — ABNORMAL LOW (ref 98–111)
Creatinine: 0.78 mg/dL (ref 0.44–1.00)
GFR, Est AFR Am: >60 mL/min (ref >60)
GFR, Estimated: >60 mL/min (ref >60)
Glucose, Bld: 71 mg/dL (ref 70–99)
POTASSIUM: 3.7 mmol/L (ref 3.5–5.1)
Sodium: 133 mmol/L — ABNORMAL LOW (ref 135–145)
Total Bilirubin: 1 mg/dL (ref 0.3–1.2)
Total Protein: 7.1 g/dL (ref 6.5–8.1)

## 2018-04-05 LAB — CBC WITH DIFFERENTIAL (CANCER CENTER ONLY)
Abs Immature Granulocytes: 0.01 10*3/uL (ref 0.00–0.07)
BASOS ABS: 0 10*3/uL (ref 0.0–0.1)
Basophils Relative: 1 %
Eosinophils Absolute: 0.1 10*3/uL (ref 0.0–0.5)
Eosinophils Relative: 2 %
HCT: 36.8 % (ref 36.0–46.0)
Hemoglobin: 11.9 g/dL — ABNORMAL LOW (ref 12.0–15.0)
Immature Granulocytes: 0 %
Lymphocytes Relative: 38 %
Lymphs Abs: 1.4 10*3/uL (ref 0.7–4.0)
MCH: 28.9 pg (ref 26.0–34.0)
MCHC: 32.3 g/dL (ref 30.0–36.0)
MCV: 89.3 fL (ref 80.0–100.0)
Monocytes Absolute: 0.4 10*3/uL (ref 0.1–1.0)
Monocytes Relative: 10 %
NEUTROS ABS: 1.8 10*3/uL (ref 1.7–7.7)
Neutrophils Relative %: 49 %
Platelet Count: 236 10*3/uL (ref 150–400)
RBC: 4.12 MIL/uL (ref 3.87–5.11)
RDW: 13.6 % (ref 11.5–15.5)
WBC: 3.7 10*3/uL — AB (ref 4.0–10.5)
nRBC: 0 % (ref 0.0–0.2)

## 2018-04-05 MED ORDER — HEPARIN SOD (PORK) LOCK FLUSH 100 UNIT/ML IV SOLN
500.0000 [IU] | Freq: Once | INTRAVENOUS | Status: AC | PRN
  Administered 2018-04-05: 500 [IU]
  Filled 2018-04-05: qty 5

## 2018-04-05 MED ORDER — SODIUM CHLORIDE 0.9% FLUSH
10.0000 mL | INTRAVENOUS | Status: DC | PRN
  Administered 2018-04-05: 10 mL
  Filled 2018-04-05: qty 10

## 2018-04-05 NOTE — Therapy (Signed)
[place], [place], [address] Phone: [phone]   Fax:  [phone]  Physical Therapy Treatment  Patient Details  Name: Penny Huynh MRN: [id number] Date of Birth: [birth year] Referring Provider (PT): [name]    Encounter Date: 04/05/2018  PT End of Session - 04/05/18 1059    Visit Number  9    Number of Visits  17    Date for PT Re-Evaluation  04/25/18    Authorization Type  Medicare    PT Start Time  1020    PT Stop Time  1101    PT Time Calculation (min)  41 min    Activity Tolerance  Patient tolerated treatment well    Behavior During Therapy  [facility] for tasks assessed/performed       Past Medical History:  Diagnosis Date  . Arthritis    "maybe a little in my left knee" (02/23/2017)  . Asthmatic bronchitis   . Cancer of left breast ([place])   . Coronary atherosclerosis    Minor nonobstructive CAD at cardiac catheterization 2009  . Dyslipidemia   . Femoral bruit 7/08   With normal ABIs  . Hypertension   . Lower extremity edema    Chronic  . Pneumonia ~ 1950   in 8th grade  . Type II diabetes mellitus ([place])     Past Surgical History:  Procedure Laterality Date  . APPENDECTOMY    . BREAST BIOPSY Left 02/2017  . CARDIAC CATHETERIZATION     "long time ago" (02/23/2017)  . MASTECTOMY COMPLETE / SIMPLE W/ SENTINEL NODE BIOPSY Left 02/23/2017   TOTAL MATECTOMY;  LEFT AXILLARY SENTINEL LYMPH NODE BIOPSY ERAS PATHWAY  . MASTECTOMY WITH RADIOACTIVE SEED GUIDED EXCISION AND AXILLARY SENTINEL LYMPH NODE BIOPSY Left 02/23/2017   Procedure: LEFT TOTAL MATECTOMY;  LEFT AXILLARY SENTINEL LYMPH NODE BIOPSY ERAS PATHWAY;  Surgeon: Excell Seltzer, MD;  Location: [facility];  Service: General;  Laterality: Left;  . PILONIDAL CYST EXCISION    . PORTA CATH INSERTION  02/23/2017  . PORTACATH PLACEMENT N/A 02/23/2017   Procedure: INSERTION PORT-A-CATH;  Surgeon: Excell Seltzer, MD;  Location: [facility];   Service: General;  Laterality: N/A;  . SHOULDER ARTHROSCOPY W/ ROTATOR CUFF REPAIR Right     There were no vitals filed for this visit.  Subjective Assessment - 04/05/18 1023    Subjective  I think my chest is doing better. I went to exercise yesterday and I am not sure that was the best thing for me.     Pertinent History  Patient underwent a left mastectomy and sentinel node biopsy (6 negative nodes) on 02/23/17. She had chemotherapy beginning 03/31/17.  She had 2 different breast cancers. Both are ER/PR positive but one is HER2 negative and one is HER2 positive. History of right shoulder scope 20 years ago and has diabetes and hypertension, both of which are well controlled. 09/28/2017: pt has completed chemo and radiation completed 09/03/2017.  She is still getting herceptin infusions every 3 weeks.  She continues to struggle with chemotherapy induced peripheral neuropathy in her hands and feet.  Her hands feel cold and she has pain and numbness in her feet     Patient Stated Goals  get shoulder motion back and return to community fitness programs     Currently in Pain?  No/denies    Pain Score  0-No pain  [facility] Adult PT Treatment/Exercise - 04/05/18 0001      Manual Therapy   Manual Therapy  Passive ROM    Soft tissue mobilization  across left chest in area of tightness    Myofascial Release  To Lt axilla and UE pulling during P/ROM    Passive ROM  to left shoulder in direction of flexion, abduction,and D2 with prolonged holds to pt's tolerance                  PT Long Term Goals - 03/28/18 1024      PT LONG TERM GOAL #1   Title  Pt will demonstrate 140 degrees of left shoulder flexion to allow her to reach overhead    Baseline  130, 03/28/18- 142 degrees     Time  4    Period  Weeks    Status  Achieved      PT LONG TERM GOAL #2   Title  Pt will demonstrate 130 degrees of left shoulder abduction to allow her to reach out to the side     Baseline  90, 03/28/18- 120    Time  4    Period  Weeks    Status  On-going      PT LONG TERM GOAL #3   Title  Pt will be indepedent in a home exercise program for continued strengthening and stretching    Time  4    Period  Weeks    Status  New      PT LONG TERM GOAL #4   Title  Pt will receive a new compression sleeve and glove for long term management of LUE lymphedema    Baseline  03/28/18- pt reports she needs to go pick it up    Time  4    Period  Weeks    Status  On-going      PT LONG TERM GOAL #5   Title  Pt will be able to get off the floor without assistance so she can return to her exercise class at the gym    Baseline  03/28/18- pt is now able to get up off the floor without assistance as demonstrated today    Time  4    Period  Weeks    Status  Achieved            Plan - 04/05/18 1100    Clinical Impression Statement  Continued with myofascial release and soft tissue mobilization to left pec to help decrease tightness. Continued with PROM to left shoulder to increase mobility. Pt recently has been doing sit ups at the gym and has had increased tenderness across chest. Instructed pt to stop doing sit ups since this may be causing her pec to become more tight.     Rehab Potential  Good    Clinical Impairments Affecting Rehab Potential  CIPN, hx of radiation    PT Frequency  2x / week    PT Duration  4 weeks    PT Treatment/Interventions  ADLs/Self Care Home Management;Therapeutic exercise;Therapeutic activities;Patient/family education;Manual techniques;Passive range of motion;Scar mobilization;Manual lymph drainage;Compression bandaging;Taping    PT Next Visit Plan  Cont PROM to left shoulder, scar massage to Lt mastectomy scar, pulleys, ball,    PT Home Exercise Plan  post op breast exercises, [id number] ( wall squats, heel raises, sidelying hip abduciton )     Consulted and Agree with Plan of Care  Patient       Patient will benefit from  skilled therapeutic  intervention in order to improve the following deficits and impairments:  Increased fascial restricitons, Pain, Decreased scar mobility, Postural dysfunction, Decreased range of motion, Decreased strength, Impaired UE functional use, Decreased knowledge of precautions, Increased edema  Visit Diagnosis: Stiffness of left shoulder, not elsewhere classified  Chronic left shoulder pain  Abnormal posture     Problem List Patient Active Problem List   Diagnosis Date Noted  . Chemotherapy-induced peripheral neuropathy ([place]) 09/03/2017  . Port-A-Cath in place 04/14/2017  . Stage II breast cancer, left ([place]) 02/23/2017  . Carcinoma of upper-outer quadrant of left breast in female, estrogen receptor positive ([place]) 02/10/2017  . Carcinoma of upper-inner quadrant of left breast in female, estrogen receptor positive ([place]) 02/10/2017  . Chest pain 07/18/2015  . Hypokalemia 07/18/2015  . Vertigo 07/18/2015  . LBBB (left bundle branch block) 05/28/2009  . AODM 12/21/2007  . Essential hypertension, benign 12/21/2007  . DIASTOLIC DYSFUNCTION 09/82/8675  . FEMORAL BRUIT, RIGHT 12/21/2007  . DYSPNEA 12/21/2007    Allyson Sabal [facility] 04/05/2018, 11:03 AM  [place], [place], [address] Phone: [phone]   Fax:  [phone]  Name: Penny Huynh MRN: [id number] Date of Birth: 04/01/1933  Manus Gunning, PT 04/05/18 11:04 AM

## 2018-04-06 ENCOUNTER — Inpatient Hospital Stay: Payer: Medicare Other

## 2018-04-06 VITALS — BP 143/69 | HR 67 | Temp 97.8°F | Resp 16

## 2018-04-06 DIAGNOSIS — C50412 Malignant neoplasm of upper-outer quadrant of left female breast: Secondary | ICD-10-CM | POA: Diagnosis not present

## 2018-04-06 DIAGNOSIS — C50212 Malignant neoplasm of upper-inner quadrant of left female breast: Secondary | ICD-10-CM | POA: Diagnosis not present

## 2018-04-06 DIAGNOSIS — Z5112 Encounter for antineoplastic immunotherapy: Secondary | ICD-10-CM | POA: Diagnosis not present

## 2018-04-06 DIAGNOSIS — Z170 Estrogen receptor positive status [ER+]: Secondary | ICD-10-CM | POA: Diagnosis not present

## 2018-04-06 DIAGNOSIS — T451X5S Adverse effect of antineoplastic and immunosuppressive drugs, sequela: Secondary | ICD-10-CM | POA: Diagnosis not present

## 2018-04-06 DIAGNOSIS — G620 Drug-induced polyneuropathy: Secondary | ICD-10-CM | POA: Diagnosis not present

## 2018-04-06 MED ORDER — HEPARIN SOD (PORK) LOCK FLUSH 100 UNIT/ML IV SOLN
500.0000 [IU] | Freq: Once | INTRAVENOUS | Status: AC | PRN
  Administered 2018-04-06: 500 [IU]
  Filled 2018-04-06: qty 5

## 2018-04-06 MED ORDER — DIPHENHYDRAMINE HCL 25 MG PO CAPS
50.0000 mg | ORAL_CAPSULE | Freq: Once | ORAL | Status: AC
  Administered 2018-04-06: 50 mg via ORAL

## 2018-04-06 MED ORDER — SODIUM CHLORIDE 0.9 % IV SOLN
Freq: Once | INTRAVENOUS | Status: AC
  Administered 2018-04-06: 08:00:00 via INTRAVENOUS
  Filled 2018-04-06: qty 250

## 2018-04-06 MED ORDER — SODIUM CHLORIDE 0.9% FLUSH
10.0000 mL | INTRAVENOUS | Status: DC | PRN
  Administered 2018-04-06: 10 mL
  Filled 2018-04-06: qty 10

## 2018-04-06 MED ORDER — ACETAMINOPHEN 325 MG PO TABS
ORAL_TABLET | ORAL | Status: AC
  Filled 2018-04-06: qty 2

## 2018-04-06 MED ORDER — DIPHENHYDRAMINE HCL 25 MG PO CAPS
ORAL_CAPSULE | ORAL | Status: AC
  Filled 2018-04-06: qty 2

## 2018-04-06 MED ORDER — TRASTUZUMAB CHEMO 150 MG IV SOLR
450.0000 mg | Freq: Once | INTRAVENOUS | Status: AC
  Administered 2018-04-06: 450 mg via INTRAVENOUS
  Filled 2018-04-06: qty 21.43

## 2018-04-06 MED ORDER — ACETAMINOPHEN 325 MG PO TABS
650.0000 mg | ORAL_TABLET | Freq: Once | ORAL | Status: AC
  Administered 2018-04-06: 650 mg via ORAL

## 2018-04-06 NOTE — Patient Instructions (Signed)
[facility] Discharge Instructions for Patients Receiving Chemotherapy  Today you received the following chemotherapy agents Trastuzumab (HERCEPTIN).  To help prevent nausea and vomiting after your treatment, we encourage you to take your nausea medication as prescribed.   If you develop nausea and vomiting that is not controlled by your nausea medication, call the clinic.   BELOW ARE SYMPTOMS THAT SHOULD BE REPORTED IMMEDIATELY:  *FEVER GREATER THAN 100.5 F  *CHILLS WITH OR WITHOUT FEVER  NAUSEA AND VOMITING THAT IS NOT CONTROLLED WITH YOUR NAUSEA MEDICATION  *UNUSUAL SHORTNESS OF BREATH  *UNUSUAL BRUISING OR BLEEDING  TENDERNESS IN MOUTH AND THROAT WITH OR WITHOUT PRESENCE OF ULCERS  *URINARY PROBLEMS  *BOWEL PROBLEMS  UNUSUAL RASH Items with * indicate a potential emergency and should be followed up as soon as possible.  Feel free to call the clinic should you have any questions or concerns. The clinic phone number is [phone].  Please show the CHEMO ALERT CARD at check-in to the Emergency Department and triage nurse.   

## 2018-04-08 ENCOUNTER — Encounter: Payer: Self-pay | Admitting: Physical Therapy

## 2018-04-08 ENCOUNTER — Other Ambulatory Visit: Payer: Self-pay

## 2018-04-08 ENCOUNTER — Ambulatory Visit: Payer: Medicare Other | Admitting: Physical Therapy

## 2018-04-08 DIAGNOSIS — M25512 Pain in left shoulder: Secondary | ICD-10-CM

## 2018-04-08 DIAGNOSIS — R2681 Unsteadiness on feet: Secondary | ICD-10-CM | POA: Diagnosis not present

## 2018-04-08 DIAGNOSIS — M6281 Muscle weakness (generalized): Secondary | ICD-10-CM | POA: Diagnosis not present

## 2018-04-08 DIAGNOSIS — M25612 Stiffness of left shoulder, not elsewhere classified: Secondary | ICD-10-CM

## 2018-04-08 DIAGNOSIS — G8929 Other chronic pain: Secondary | ICD-10-CM

## 2018-04-08 DIAGNOSIS — R293 Abnormal posture: Secondary | ICD-10-CM | POA: Diagnosis not present

## 2018-04-08 NOTE — Therapy (Signed)
[place], [place], [address] Phone: [phone]   Fax:  [phone]  Physical Therapy Treatment  Patient Details  Name: Penny Huynh MRN: [id number] Date of Birth: 27-Aug-1933 Referring Provider (PT): [name]    Encounter Date: 04/08/2018  PT End of Session - 04/08/18 1157    Visit Number  10    Number of Visits  17    Date for PT Re-Evaluation  04/25/18    Authorization Type  Medicare    PT Start Time  1020    PT Stop Time  1101    PT Time Calculation (min)  41 min    Activity Tolerance  Patient tolerated treatment well    Behavior During Therapy  [facility] for tasks assessed/performed       Past Medical History:  Diagnosis Date  . Arthritis    "maybe a little in my left knee" (02/23/2017)  . Asthmatic bronchitis   . Cancer of left breast ([place])   . Coronary atherosclerosis    Minor nonobstructive CAD at cardiac catheterization 2009  . Dyslipidemia   . Femoral bruit 7/08   With normal ABIs  . Hypertension   . Lower extremity edema    Chronic  . Pneumonia ~ 1950   in 8th grade  . Type II diabetes mellitus ([place])     Past Surgical History:  Procedure Laterality Date  . APPENDECTOMY    . BREAST BIOPSY Left 02/2017  . CARDIAC CATHETERIZATION     "long time ago" (02/23/2017)  . MASTECTOMY COMPLETE / SIMPLE W/ SENTINEL NODE BIOPSY Left 02/23/2017   TOTAL MATECTOMY;  LEFT AXILLARY SENTINEL LYMPH NODE BIOPSY ERAS PATHWAY  . MASTECTOMY WITH RADIOACTIVE SEED GUIDED EXCISION AND AXILLARY SENTINEL LYMPH NODE BIOPSY Left 02/23/2017   Procedure: LEFT TOTAL MATECTOMY;  LEFT AXILLARY SENTINEL LYMPH NODE BIOPSY ERAS PATHWAY;  Surgeon: Excell Seltzer, MD;  Location: [facility];  Service: General;  Laterality: Left;  . PILONIDAL CYST EXCISION    . PORTA CATH INSERTION  02/23/2017  . PORTACATH PLACEMENT N/A 02/23/2017   Procedure: INSERTION PORT-A-CATH;  Surgeon: Excell Seltzer, MD;  Location: [facility];   Service: General;  Laterality: N/A;  . SHOULDER ARTHROSCOPY W/ ROTATOR CUFF REPAIR Right     There were no vitals filed for this visit.  Subjective Assessment - 04/08/18 1022    Subjective  My feet are hurting today. I ate a whole bag of dill potato chips.     Pertinent History  Patient underwent a left mastectomy and sentinel node biopsy (6 negative nodes) on 02/23/17. She had chemotherapy beginning 03/31/17.  She had 2 different breast cancers. Both are ER/PR positive but one is HER2 negative and one is HER2 positive. History of right shoulder scope 20 years ago and has diabetes and hypertension, both of which are well controlled. 09/28/2017: pt has completed chemo and radiation completed 09/03/2017.  She is still getting herceptin infusions every 3 weeks.  She continues to struggle with chemotherapy induced peripheral neuropathy in her hands and feet.  Her hands feel cold and she has pain and numbness in her feet     Patient Stated Goals  get shoulder motion back and return to community fitness programs     Currently in Pain?  Yes    Pain Score  8     Pain Location  Foot    Pain Orientation  Right;Left         [facility] PT Assessment - 04/08/18  0001      AROM   Left Shoulder Flexion  144 Degrees    Left Shoulder ABduction  130 Degrees                   [facility] Adult PT Treatment/Exercise - 04/08/18 0001      Manual Therapy   Manual Therapy  Passive ROM    Soft tissue mobilization  across left chest in area of tightness    Myofascial Release  To Lt axilla and UE pulling during P/ROM    Passive ROM  to left shoulder in direction of flexion, abduction,and D2 with prolonged holds to pt's tolerance                  PT Long Term Goals - 03/28/18 1024      PT LONG TERM GOAL #1   Title  Pt will demonstrate 140 degrees of left shoulder flexion to allow her to reach overhead    Baseline  130, 03/28/18- 142 degrees     Time  4    Period  Weeks    Status  Achieved       PT LONG TERM GOAL #2   Title  Pt will demonstrate 130 degrees of left shoulder abduction to allow her to reach out to the side    Baseline  90, 03/28/18- 120    Time  4    Period  Weeks    Status  On-going      PT LONG TERM GOAL #3   Title  Pt will be indepedent in a home exercise program for continued strengthening and stretching    Time  4    Period  Weeks    Status  New      PT LONG TERM GOAL #4   Title  Pt will receive a new compression sleeve and glove for long term management of LUE lymphedema    Baseline  03/28/18- pt reports she needs to go pick it up    Time  4    Period  Weeks    Status  On-going      PT LONG TERM GOAL #5   Title  Pt will be able to get off the floor without assistance so she can return to her exercise class at the gym    Baseline  03/28/18- pt is now able to get up off the floor without assistance as demonstrated today    Time  4    Period  Weeks    Status  Achieved            Plan - 04/08/18 1157    Clinical Impression Statement  Remeasured AROM today and pt has gained 10 degrees of left shoulder abduction. Continued with myofascial release, soft tissue mobilization and PROM to left shoulder to decrease tightness.     Rehab Potential  Good    Clinical Impairments Affecting Rehab Potential  CIPN, hx of radiation    PT Frequency  2x / week    PT Duration  4 weeks    PT Treatment/Interventions  ADLs/Self Care Home Management;Therapeutic exercise;Therapeutic activities;Patient/family education;Manual techniques;Passive range of motion;Scar mobilization;Manual lymph drainage;Compression bandaging;Taping    PT Next Visit Plan  Cont PROM to left shoulder, scar massage to Lt mastectomy scar, pulleys, ball,    PT Home Exercise Plan  post op breast exercises, [id number] ( wall squats, heel raises, sidelying hip abduciton )     Consulted and Agree with Plan of Care  Patient       Patient will benefit from skilled therapeutic intervention in order to improve  the following deficits and impairments:  Increased fascial restricitons, Pain, Decreased scar mobility, Postural dysfunction, Decreased range of motion, Decreased strength, Impaired UE functional use, Decreased knowledge of precautions, Increased edema  Visit Diagnosis: Stiffness of left shoulder, not elsewhere classified  Chronic left shoulder pain  Abnormal posture     Problem List Patient Active Problem List   Diagnosis Date Noted  . Chemotherapy-induced peripheral neuropathy ([place]) 09/03/2017  . Port-A-Cath in place 04/14/2017  . Stage II breast cancer, left ([place]) 02/23/2017  . Carcinoma of upper-outer quadrant of left breast in female, estrogen receptor positive ([place]) 02/10/2017  . Carcinoma of upper-inner quadrant of left breast in female, estrogen receptor positive ([place]) 02/10/2017  . Chest pain 07/18/2015  . Hypokalemia 07/18/2015  . Vertigo 07/18/2015  . LBBB (left bundle branch block) 05/28/2009  . AODM 12/21/2007  . Essential hypertension, benign 12/21/2007  . DIASTOLIC DYSFUNCTION 00/37/0488  . FEMORAL BRUIT, RIGHT 12/21/2007  . DYSPNEA 12/21/2007    Allyson Sabal [facility] 04/08/2018, 12:00 PM  [place], [place], [address] Phone: [phone]   Fax:  [phone]  Name: Penny Huynh MRN: [id number] Date of Birth: 07/18/1933  Manus Gunning, PT 04/08/18 12:00 PM

## 2018-04-12 ENCOUNTER — Encounter: Payer: Self-pay | Admitting: Physical Therapy

## 2018-04-12 ENCOUNTER — Ambulatory Visit: Payer: Medicare Other | Admitting: Physical Therapy

## 2018-04-12 DIAGNOSIS — R293 Abnormal posture: Secondary | ICD-10-CM

## 2018-04-12 DIAGNOSIS — M25512 Pain in left shoulder: Secondary | ICD-10-CM

## 2018-04-12 DIAGNOSIS — M6281 Muscle weakness (generalized): Secondary | ICD-10-CM

## 2018-04-12 DIAGNOSIS — R2681 Unsteadiness on feet: Secondary | ICD-10-CM

## 2018-04-12 DIAGNOSIS — G8929 Other chronic pain: Secondary | ICD-10-CM | POA: Diagnosis not present

## 2018-04-12 DIAGNOSIS — M25612 Stiffness of left shoulder, not elsewhere classified: Secondary | ICD-10-CM | POA: Diagnosis not present

## 2018-04-12 DIAGNOSIS — R209 Unspecified disturbances of skin sensation: Secondary | ICD-10-CM

## 2018-04-12 NOTE — Therapy (Signed)
[place], [place], [address] Phone: [phone]   Fax:  [phone]  Physical Therapy Treatment  Patient Details  Name: Penny Huynh MRN: [id number] Date of Birth: 1933/07/27 Referring Provider (PT): [name]    Encounter Date: 04/12/2018  PT End of Session - 04/12/18 1716    Visit Number  11    Number of Visits  17    Date for PT Re-Evaluation  04/25/18    PT Start Time  1304    PT Stop Time  1345    PT Time Calculation (min)  41 min    Activity Tolerance  Patient tolerated treatment well    Behavior During Therapy  [facility] for tasks assessed/performed       Past Medical History:  Diagnosis Date  . Arthritis    "maybe a little in my left knee" (02/23/2017)  . Asthmatic bronchitis   . Cancer of left breast ([place])   . Coronary atherosclerosis    Minor nonobstructive CAD at cardiac catheterization 2009  . Dyslipidemia   . Femoral bruit 7/08   With normal ABIs  . Hypertension   . Lower extremity edema    Chronic  . Pneumonia ~ 1950   in 8th grade  . Type II diabetes mellitus ([place])     Past Surgical History:  Procedure Laterality Date  . APPENDECTOMY    . BREAST BIOPSY Left 02/2017  . CARDIAC CATHETERIZATION     "long time ago" (02/23/2017)  . MASTECTOMY COMPLETE / SIMPLE W/ SENTINEL NODE BIOPSY Left 02/23/2017   TOTAL MATECTOMY;  LEFT AXILLARY SENTINEL LYMPH NODE BIOPSY ERAS PATHWAY  . MASTECTOMY WITH RADIOACTIVE SEED GUIDED EXCISION AND AXILLARY SENTINEL LYMPH NODE BIOPSY Left 02/23/2017   Procedure: LEFT TOTAL MATECTOMY;  LEFT AXILLARY SENTINEL LYMPH NODE BIOPSY ERAS PATHWAY;  Surgeon: Excell Seltzer, MD;  Location: [place];  Service: General;  Laterality: Left;  . PILONIDAL CYST EXCISION    . PORTA CATH INSERTION  02/23/2017  . PORTACATH PLACEMENT N/A 02/23/2017   Procedure: INSERTION PORT-A-CATH;  Surgeon: Excell Seltzer, MD;  Location: [place];  Service: General;  Laterality:  N/A;  . SHOULDER ARTHROSCOPY W/ ROTATOR CUFF REPAIR Right     There were no vitals filed for this visit.  Subjective Assessment - 04/12/18 1308    Subjective  Pt she is exercising with a group of friends and [facility] [employer] [facility].  She is thinking she will do that once she finishes up here and will join the [employer] on April [facility]     Pertinent History  Patient underwent a left mastectomy and sentinel node biopsy (6 negative nodes) on 02/23/17. She had chemotherapy beginning 03/31/17.  She had 2 different breast cancers. Both are ER/PR positive but one is HER2 negative and one is HER2 positive. History of right shoulder scope 20 years ago and has diabetes and hypertension, both of which are well controlled. 09/28/2017: pt has completed chemo and radiation completed 09/03/2017.  She is still getting herceptin infusions every 3 weeks.  She continues to struggle with chemotherapy induced peripheral neuropathy in her hands and feet.  Her hands feel cold and she has pain and numbness in her feet     Patient Stated Goals  get shoulder motion back and return to community fitness programs     Currently in Pain?  No/denies  [facility] Adult PT Treatment/Exercise - 04/12/18 0001      Exercises   Exercises  Shoulder;Lumbar      Knee/Hip Exercises: Standing   Forward Step Up  Right;Left;2 sets;10 reps;Step Height: 6"    Forward Step Up Limitations  holding 2 dowel rods for balance     Other Standing Knee Exercises  Hip hinge x 2 sets of 10 using dowel rod and verbal cues for back extension       Shoulder Exercises: Standing   External Rotation  Strengthening;Left;20 reps;Theraband   towel roll under axilla    Theraband Level (Shoulder External Rotation)  Level 2 (Red)    Row  Strengthening;Right;Left;20 reps;Theraband    Theraband Level (Shoulder Row)  Level 2 (Red)    Other Standing Exercises  dowel stretched for flexion and abduciton x 5 reps while  maintaining her balance in standing       Shoulder Exercises: Pulleys   Flexion  2 minutes    ABduction  2 minutes      Manual Therapy   Manual Therapy  Passive ROM    Soft tissue mobilization  across left chest in area of tightness    Myofascial Release  To Lt axilla and UE pulling during P/ROM    Passive ROM  to left shoulder in direction of flexion, abduction,and D2 with prolonged holds to pt's tolerance                  PT Long Term Goals - 03/28/18 1024      PT LONG TERM GOAL #1   Title  Pt will demonstrate 140 degrees of left shoulder flexion to allow her to reach overhead    Baseline  130, 03/28/18- 142 degrees     Time  4    Period  Weeks    Status  Achieved      PT LONG TERM GOAL #2   Title  Pt will demonstrate 130 degrees of left shoulder abduction to allow her to reach out to the side    Baseline  90, 03/28/18- 120    Time  4    Period  Weeks    Status  On-going      PT LONG TERM GOAL #3   Title  Pt will be indepedent in a home exercise program for continued strengthening and stretching    Time  4    Period  Weeks    Status  New      PT LONG TERM GOAL #4   Title  Pt will receive a new compression sleeve and glove for long term management of LUE lymphedema    Baseline  03/28/18- pt reports she needs to go pick it up    Time  4    Period  Weeks    Status  On-going      PT LONG TERM GOAL #5   Title  Pt will be able to get off the floor without assistance so she can return to her exercise class at the gym    Baseline  03/28/18- pt is now able to get up off the floor without assistance as demonstrated today    Time  4    Period  Weeks    Status  Achieved            Plan - 04/12/18 1717    Clinical Impression Statement  pt did well with exercise session today and was able to do standing exercise with also challendged her balance.  she continues to have much tightness and fibrosis in her chest with discoloration from the radiation and will benefit  from continued PT to help that     PT Frequency  2x / week    PT Duration  4 weeks    PT Treatment/Interventions  ADLs/Self Care Home Management;Therapeutic exercise;Therapeutic activities;Patient/family education;Manual techniques;Passive range of motion;Scar mobilization;Manual lymph drainage;Compression bandaging;Taping    PT Next Visit Plan  Cont PROM to left shoulder, scar massage to Lt mastectomy scar, pulleys, ball,    Consulted and Agree with Plan of Care  Patient       Patient will benefit from skilled therapeutic intervention in order to improve the following deficits and impairments:  Increased fascial restricitons, Pain, Decreased scar mobility, Postural dysfunction, Decreased range of motion, Decreased strength, Impaired UE functional use, Decreased knowledge of precautions, Increased edema  Visit Diagnosis: Stiffness of left shoulder, not elsewhere classified  Chronic left shoulder pain  Abnormal posture  Muscle weakness (generalized)  Unsteadiness on feet  Unspecified disturbances of skin sensation     Problem List Patient Active Problem List   Diagnosis Date Noted  . Chemotherapy-induced peripheral neuropathy ([place]) 09/03/2017  . Port-A-Cath in place 04/14/2017  . Stage II breast cancer, left ([place]) 02/23/2017  . Carcinoma of upper-outer quadrant of left breast in female, estrogen receptor positive ([place]) 02/10/2017  . Carcinoma of upper-inner quadrant of left breast in female, estrogen receptor positive ([place]) 02/10/2017  . Chest pain 07/18/2015  . Hypokalemia 07/18/2015  . Vertigo 07/18/2015  . LBBB (left bundle branch block) 05/28/2009  . AODM 12/21/2007  . Essential hypertension, benign 12/21/2007  . DIASTOLIC DYSFUNCTION 34/28/7681  . FEMORAL BRUIT, RIGHT 12/21/2007  . DYSPNEA 12/21/2007   Donato Heinz. Owens Shark PT  Norwood Levo 04/12/2018, 5:20 PM  [place], [place],  [address] Phone: [phone]   Fax:  [phone]  Name: MARGEL JOENS MRN: [id number] Date of Birth: 1933-03-12

## 2018-04-14 ENCOUNTER — Ambulatory Visit: Payer: Medicare Other | Admitting: Physical Therapy

## 2018-04-14 ENCOUNTER — Other Ambulatory Visit: Payer: Self-pay

## 2018-04-14 ENCOUNTER — Encounter: Payer: Self-pay | Admitting: Physical Therapy

## 2018-04-14 DIAGNOSIS — R2681 Unsteadiness on feet: Secondary | ICD-10-CM | POA: Diagnosis not present

## 2018-04-14 DIAGNOSIS — G8929 Other chronic pain: Secondary | ICD-10-CM | POA: Diagnosis not present

## 2018-04-14 DIAGNOSIS — M25512 Pain in left shoulder: Secondary | ICD-10-CM

## 2018-04-14 DIAGNOSIS — R293 Abnormal posture: Secondary | ICD-10-CM | POA: Diagnosis not present

## 2018-04-14 DIAGNOSIS — M6281 Muscle weakness (generalized): Secondary | ICD-10-CM | POA: Diagnosis not present

## 2018-04-14 DIAGNOSIS — M25612 Stiffness of left shoulder, not elsewhere classified: Secondary | ICD-10-CM

## 2018-04-14 NOTE — Therapy (Signed)
[place], [place], [address] Phone: [phone]   Fax:  [phone]  Physical Therapy Treatment  Patient Details  Name: Penny Huynh MRN: [id number] Date of Birth: [birth year] Referring Provider (PT): [name]    Encounter Date: 04/14/2018  PT End of Session - 04/14/18 1159    Visit Number  12    Number of Visits  17    Date for PT Re-Evaluation  04/25/18    Authorization Type  Medicare    PT Start Time  0939    PT Stop Time  1018    PT Time Calculation (min)  39 min    Activity Tolerance  Patient tolerated treatment well    Behavior During Therapy  [facility] for tasks assessed/performed       Past Medical History:  Diagnosis Date  . Arthritis    "maybe a little in my left knee" (02/23/2017)  . Asthmatic bronchitis   . Cancer of left breast ([place])   . Coronary atherosclerosis    Minor nonobstructive CAD at cardiac catheterization 2009  . Dyslipidemia   . Femoral bruit 7/08   With normal ABIs  . Hypertension   . Lower extremity edema    Chronic  . Pneumonia ~ 1950   in 8th grade  . Type II diabetes mellitus ([place])     Past Surgical History:  Procedure Laterality Date  . APPENDECTOMY    . BREAST BIOPSY Left 02/2017  . CARDIAC CATHETERIZATION     "long time ago" (02/23/2017)  . MASTECTOMY COMPLETE / SIMPLE W/ SENTINEL NODE BIOPSY Left 02/23/2017   TOTAL MATECTOMY;  LEFT AXILLARY SENTINEL LYMPH NODE BIOPSY ERAS PATHWAY  . MASTECTOMY WITH RADIOACTIVE SEED GUIDED EXCISION AND AXILLARY SENTINEL LYMPH NODE BIOPSY Left 02/23/2017   Procedure: LEFT TOTAL MATECTOMY;  LEFT AXILLARY SENTINEL LYMPH NODE BIOPSY ERAS PATHWAY;  Surgeon: Excell Seltzer, MD;  Location: [place];  Service: General;  Laterality: Left;  . PILONIDAL CYST EXCISION    . PORTA CATH INSERTION  02/23/2017  . PORTACATH PLACEMENT N/A 02/23/2017   Procedure: INSERTION PORT-A-CATH;  Surgeon: Excell Seltzer, MD;  Location: [place];   Service: General;  Laterality: N/A;  . SHOULDER ARTHROSCOPY W/ ROTATOR CUFF REPAIR Right     There were no vitals filed for this visit.  Subjective Assessment - 04/14/18 1158    Subjective  I think my arm is moving better.     Pertinent History  Patient underwent a left mastectomy and sentinel node biopsy (6 negative nodes) on 02/23/17. She had chemotherapy beginning 03/31/17.  She had 2 different breast cancers. Both are ER/PR positive but one is HER2 negative and one is HER2 positive. History of right shoulder scope 20 years ago and has diabetes and hypertension, both of which are well controlled. 09/28/2017: pt has completed chemo and radiation completed 09/03/2017.  She is still getting herceptin infusions every 3 weeks.  She continues to struggle with chemotherapy induced peripheral neuropathy in her hands and feet.  Her hands feel cold and she has pain and numbness in her feet     Patient Stated Goals  get shoulder motion back and return to community fitness programs     Currently in Pain?  No/denies    Pain Score  0-No pain                       [facility] Adult PT Treatment/Exercise - 04/14/18 0001  Manual Therapy   Manual Therapy  Passive ROM    Soft tissue mobilization  across left chest in area of tightness    Myofascial Release  To Lt axilla and UE pulling during P/ROM    Passive ROM  to left shoulder in direction of flexion, abduction,and D2 with prolonged holds to pt's tolerance                  PT Long Term Goals - 03/28/18 1024      PT LONG TERM GOAL #1   Title  Pt will demonstrate 140 degrees of left shoulder flexion to allow her to reach overhead    Baseline  130, 03/28/18- 142 degrees     Time  4    Period  Weeks    Status  Achieved      PT LONG TERM GOAL #2   Title  Pt will demonstrate 130 degrees of left shoulder abduction to allow her to reach out to the side    Baseline  90, 03/28/18- 120    Time  4    Period  Weeks    Status  On-going       PT LONG TERM GOAL #3   Title  Pt will be indepedent in a home exercise program for continued strengthening and stretching    Time  4    Period  Weeks    Status  New      PT LONG TERM GOAL #4   Title  Pt will receive a new compression sleeve and glove for long term management of LUE lymphedema    Baseline  03/28/18- pt reports she needs to go pick it up    Time  4    Period  Weeks    Status  On-going      PT LONG TERM GOAL #5   Title  Pt will be able to get off the floor without assistance so she can return to her exercise class at the gym    Baseline  03/28/18- pt is now able to get up off the floor without assistance as demonstrated today    Time  4    Period  Weeks    Status  Achieved            Plan - 04/14/18 1200    Clinical Impression Statement  Pt is demonstrating improving PROM with left shoulder. Continued with manual therapy across tight left pec. Pt feels overall her ROM and tightness is improving but she still is limited with ROM.     Rehab Potential  Good    Clinical Impairments Affecting Rehab Potential  CIPN, hx of radiation    PT Frequency  2x / week    PT Duration  4 weeks    PT Treatment/Interventions  ADLs/Self Care Home Management;Therapeutic exercise;Therapeutic activities;Patient/family education;Manual techniques;Passive range of motion;Scar mobilization;Manual lymph drainage;Compression bandaging;Taping    PT Next Visit Plan  Cont PROM to left shoulder, scar massage to Lt mastectomy scar, pulleys, ball,    PT Home Exercise Plan  post op breast exercises, [id number] ( wall squats, heel raises, sidelying hip abduciton )     Consulted and Agree with Plan of Care  Patient       Patient will benefit from skilled therapeutic intervention in order to improve the following deficits and impairments:  Increased fascial restricitons, Pain, Decreased scar mobility, Postural dysfunction, Decreased range of motion, Decreased strength, Impaired UE functional use,  Decreased knowledge of precautions, Increased edema  Visit Diagnosis: Stiffness of left shoulder, not elsewhere classified  Chronic left shoulder pain  Abnormal posture     Problem List Patient Active Problem List   Diagnosis Date Noted  . Chemotherapy-induced peripheral neuropathy ([place]) 09/03/2017  . Port-A-Cath in place 04/14/2017  . Stage II breast cancer, left ([place]) 02/23/2017  . Carcinoma of upper-outer quadrant of left breast in female, estrogen receptor positive ([place]) 02/10/2017  . Carcinoma of upper-inner quadrant of left breast in female, estrogen receptor positive ([place]) 02/10/2017  . Chest pain 07/18/2015  . Hypokalemia 07/18/2015  . Vertigo 07/18/2015  . LBBB (left bundle branch block) 05/28/2009  . AODM 12/21/2007  . Essential hypertension, benign 12/21/2007  . DIASTOLIC DYSFUNCTION 07/62/2633  . FEMORAL BRUIT, RIGHT 12/21/2007  . DYSPNEA 12/21/2007    Allyson Sabal [facility] 04/14/2018, 12:01 PM  [place], [place], [address] Phone: [phone]   Fax:  [phone]  Name: MAIAH SINNING MRN: [id number] Date of Birth: 1933/08/19  Manus Gunning, PT 04/14/18 12:01 PM

## 2018-04-18 ENCOUNTER — Other Ambulatory Visit: Payer: Self-pay | Admitting: *Deleted

## 2018-04-18 MED ORDER — LOSARTAN POTASSIUM 50 MG PO TABS: 50.0000 mg | ORAL_TABLET | Freq: Two times a day (BID) | ORAL | 3 refills | Status: DC

## 2018-04-19 ENCOUNTER — Other Ambulatory Visit: Payer: Self-pay

## 2018-04-19 ENCOUNTER — Encounter: Payer: Self-pay | Admitting: Physical Therapy

## 2018-04-19 ENCOUNTER — Ambulatory Visit: Payer: Medicare Other | Admitting: Physical Therapy

## 2018-04-19 DIAGNOSIS — M25512 Pain in left shoulder: Secondary | ICD-10-CM | POA: Diagnosis not present

## 2018-04-19 DIAGNOSIS — M25612 Stiffness of left shoulder, not elsewhere classified: Secondary | ICD-10-CM | POA: Diagnosis not present

## 2018-04-19 DIAGNOSIS — M6281 Muscle weakness (generalized): Secondary | ICD-10-CM | POA: Diagnosis not present

## 2018-04-19 DIAGNOSIS — G8929 Other chronic pain: Secondary | ICD-10-CM

## 2018-04-19 DIAGNOSIS — R2681 Unsteadiness on feet: Secondary | ICD-10-CM | POA: Diagnosis not present

## 2018-04-19 DIAGNOSIS — R293 Abnormal posture: Secondary | ICD-10-CM | POA: Diagnosis not present

## 2018-04-19 MED ORDER — LOSARTAN POTASSIUM 50 MG PO TABS: 50.0000 mg | ORAL_TABLET | Freq: Two times a day (BID) | ORAL | 3 refills | Status: DC

## 2018-04-19 NOTE — Therapy (Signed)
[place], [place], [address] Phone: [phone]   Fax:  [phone]  Physical Therapy Treatment  Patient Details  Name: Penny Huynh MRN: [id number] Date of Birth: 1934-01-04 Referring Provider (PT): [name]    Encounter Date: 04/19/2018  PT End of Session - 04/19/18 1154    Visit Number  13    Number of Visits  17    Date for PT Re-Evaluation  04/25/18    Authorization Type  Medicare    PT Start Time  1021    PT Stop Time  1102    PT Time Calculation (min)  41 min    Activity Tolerance  Patient tolerated treatment well    Behavior During Therapy  [facility] for tasks assessed/performed       Past Medical History:  Diagnosis Date  . Arthritis    "maybe a little in my left knee" (02/23/2017)  . Asthmatic bronchitis   . Cancer of left breast ([place])   . Coronary atherosclerosis    Minor nonobstructive CAD at cardiac catheterization 2009  . Dyslipidemia   . Femoral bruit 7/08   With normal ABIs  . Hypertension   . Lower extremity edema    Chronic  . Pneumonia ~ 1950   in 8th grade  . Type II diabetes mellitus ([place])     Past Surgical History:  Procedure Laterality Date  . APPENDECTOMY    . BREAST BIOPSY Left 02/2017  . CARDIAC CATHETERIZATION     "long time ago" (02/23/2017)  . MASTECTOMY COMPLETE / SIMPLE W/ SENTINEL NODE BIOPSY Left 02/23/2017   TOTAL MATECTOMY;  LEFT AXILLARY SENTINEL LYMPH NODE BIOPSY ERAS PATHWAY  . MASTECTOMY WITH RADIOACTIVE SEED GUIDED EXCISION AND AXILLARY SENTINEL LYMPH NODE BIOPSY Left 02/23/2017   Procedure: LEFT TOTAL MATECTOMY;  LEFT AXILLARY SENTINEL LYMPH NODE BIOPSY ERAS PATHWAY;  Surgeon: Excell Seltzer, MD;  Location: [place];  Service: General;  Laterality: Left;  . PILONIDAL CYST EXCISION    . PORTA CATH INSERTION  02/23/2017  . PORTACATH PLACEMENT N/A 02/23/2017   Procedure: INSERTION PORT-A-CATH;  Surgeon: Excell Seltzer, MD;  Location: [place];   Service: General;  Laterality: N/A;  . SHOULDER ARTHROSCOPY W/ ROTATOR CUFF REPAIR Right     There were no vitals filed for this visit.  Subjective Assessment - 04/19/18 1023    Subjective  I think my chest tightness is better but I am not sure my arm is but at least it feels better.     Pertinent History  Patient underwent a left mastectomy and sentinel node biopsy (6 negative nodes) on 02/23/17. She had chemotherapy beginning 03/31/17.  She had 2 different breast cancers. Both are ER/PR positive but one is HER2 negative and one is HER2 positive. History of right shoulder scope 20 years ago and has diabetes and hypertension, both of which are well controlled. 09/28/2017: pt has completed chemo and radiation completed 09/03/2017.  She is still getting herceptin infusions every 3 weeks.  She continues to struggle with chemotherapy induced peripheral neuropathy in her hands and feet.  Her hands feel cold and she has pain and numbness in her feet     Patient Stated Goals  get shoulder motion back and return to community fitness programs     Currently in Pain?  No/denies    Pain Score  0-No pain  [facility] Adult PT Treatment/Exercise - 04/19/18 0001      Manual Therapy   Manual Therapy  Passive ROM    Soft tissue mobilization  across left chest in area of tightness    Myofascial Release  To Lt axilla and UE pulling during P/ROM    Passive ROM  to left shoulder in direction of flexion, abduction,and D2 with prolonged holds to pt's tolerance                  PT Long Term Goals - 04/19/18 1025      PT LONG TERM GOAL #1   Title  Pt will demonstrate 140 degrees of left shoulder flexion to allow her to reach overhead    Baseline  130, 03/28/18- 142 degrees     Time  4    Period  Weeks    Status  Achieved      PT LONG TERM GOAL #2   Title  Pt will demonstrate 130 degrees of left shoulder abduction to allow her to reach out to the side    Baseline  90,  03/28/18- 120, 04/19/18- 140    Time  4    Period  Weeks    Status  Achieved      PT LONG TERM GOAL #3   Title  Pt will be indepedent in a home exercise program for continued strengthening and stretching    Time  4    Period  Weeks    Status  Achieved      PT LONG TERM GOAL #4   Title  Pt will receive a new compression sleeve and glove for long term management of LUE lymphedema    Baseline  03/28/18- pt reports she needs to go pick it up, 04/19/18- pt now has new sleeve    Time  4    Period  Weeks    Status  Achieved      PT LONG TERM GOAL #5   Title  Pt will be able to get off the floor without assistance so she can return to her exercise class at the gym    Baseline  03/28/18- pt is now able to get up off the floor without assistance as demonstrated today    Time  4    Period  Weeks    Status  Achieved            Plan - 04/19/18 1154    Clinical Impression Statement  Assessed pt's progress twoards goals in therapy today. She has now met all goals for therapy. Her abduction ROM has improved greatly since time of evaluation. Her chest is much less tight and she has improved skin mobility. Pt will be discharged from skilled PT services at this time.     Rehab Potential  Good    Clinical Impairments Affecting Rehab Potential  CIPN, hx of radiation    PT Frequency  2x / week    PT Duration  4 weeks    PT Treatment/Interventions  ADLs/Self Care Home Management;Therapeutic exercise;Therapeutic activities;Patient/family education;Manual techniques;Passive range of motion;Scar mobilization;Manual lymph drainage;Compression bandaging;Taping    PT Next Visit Plan  dc this visit    PT Home Exercise Plan  post op breast exercises, [id number] ( wall squats, heel raises, sidelying hip abduciton )     Consulted and Agree with Plan of Care  Patient       Patient will benefit from skilled therapeutic intervention in order to improve the following deficits and impairments:  Increased fascial  restricitons, Pain, Decreased scar mobility, Postural dysfunction, Decreased range of motion, Decreased strength, Impaired UE functional use, Decreased knowledge of precautions, Increased edema  Visit Diagnosis: Stiffness of left shoulder, not elsewhere classified  Chronic left shoulder pain  Abnormal posture     Problem List Patient Active Problem List   Diagnosis Date Noted  . Chemotherapy-induced peripheral neuropathy ([place]) 09/03/2017  . Port-A-Cath in place 04/14/2017  . Stage II breast cancer, left ([place]) 02/23/2017  . Carcinoma of upper-outer quadrant of left breast in female, estrogen receptor positive ([place]) 02/10/2017  . Carcinoma of upper-inner quadrant of left breast in female, estrogen receptor positive ([place]) 02/10/2017  . Chest pain 07/18/2015  . Hypokalemia 07/18/2015  . Vertigo 07/18/2015  . LBBB (left bundle branch block) 05/28/2009  . AODM 12/21/2007  . Essential hypertension, benign 12/21/2007  . DIASTOLIC DYSFUNCTION 60/16/5800  . FEMORAL BRUIT, RIGHT 12/21/2007  . DYSPNEA 12/21/2007    Allyson Sabal [facility] 04/19/2018, 11:56 AM  [place], [place], [address] Phone: [phone]   Fax:  [phone]  Name: Penny Huynh MRN: [id number] Date of Birth: [birth year]  PHYSICAL THERAPY DISCHARGE SUMMARY  Visits from Start of Care: 13  Current functional level related to goals / functional outcomes: All goals met   Remaining deficits: None   Education / Equipment: HEP  Plan: Patient agrees to discharge.  Patient goals were met. Patient is being discharged due to meeting the stated rehab goals.  ?????    Allyson Sabal [place], [place] 04/19/18 11:56 AM

## 2018-04-20 DIAGNOSIS — M79675 Pain in left toe(s): Secondary | ICD-10-CM | POA: Diagnosis not present

## 2018-04-20 DIAGNOSIS — E1151 Type 2 diabetes mellitus with diabetic peripheral angiopathy without gangrene: Secondary | ICD-10-CM | POA: Diagnosis not present

## 2018-04-20 DIAGNOSIS — L600 Ingrowing nail: Secondary | ICD-10-CM | POA: Diagnosis not present

## 2018-04-20 DIAGNOSIS — L03032 Cellulitis of left toe: Secondary | ICD-10-CM | POA: Diagnosis not present

## 2018-04-20 DIAGNOSIS — M778 Other enthesopathies, not elsewhere classified: Secondary | ICD-10-CM | POA: Diagnosis not present

## 2018-04-20 DIAGNOSIS — M79672 Pain in left foot: Secondary | ICD-10-CM | POA: Diagnosis not present

## 2018-04-20 NOTE — Progress Notes (Signed)
[place]   [phone] [phone]   Clinic Follow up Note   Patient Care Team: Sasser, Silvestre Moment, MD as PCP - General (Cardiology) [name], MD as PCP - Cardiology (Cardiology) Truitt Merle, MD as Consulting Physician (Hematology) Excell Seltzer, MD as Consulting Physician (General Surgery) Alla Feeling, NP as Nurse Practitioner (Nurse Practitioner) [name], MD as Attending Physician (Cardiology) 04/21/2018  CHIEF COMPLAINT: F/u on left breast cancer  SUMMARY OF ONCOLOGIC HISTORY: Oncology History   Cancer Staging Carcinoma of upper-inner quadrant of left breast in female, estrogen receptor positive ([place]) Staging form: Breast, AJCC 8th Edition - Clinical stage from 01/18/2017: Stage IB (cT2, cN0, cM0, G3, ER: Positive, PR: Positive, HER2: Positive) - Signed by Truitt Merle, MD on 02/10/2017 - Pathologic stage from 02/23/2017: Stage IB (pT3, pN0, cM0, G3, ER: Positive, PR: Positive, HER2: Positive) - Signed by Alla Feeling, NP on 03/16/2017  Carcinoma of upper-outer quadrant of left breast in female, estrogen receptor positive ([place]) Staging form: Breast, AJCC 8th Edition - Clinical stage from 01/18/2017: Stage IB (cT2, cN0, cM0, G2, ER: Positive, PR: Positive, HER2: Negative) - Unsigned - Pathologic stage from 02/23/2017: Stage IB (pT2, pN0(sn), cM0, G3, ER: Positive, PR: Positive, HER2: Negative) - Signed by Alla Feeling, NP on 03/16/2017       Carcinoma of upper-outer quadrant of left breast in female, estrogen receptor positive ([place])   01/13/2017 Mammogram    FINDINGS: In the left breast, a spiculated mass lies in the upper outer quadrant. In the medial posterior left breast, there is a larger lobulated mass. These masses correspond to the palpable abnormalities. No other discrete left breast masses.  In the right breast, there are no discrete masses. There are no areas of architectural distortion.  In both  breasts there are scattered calcifications, rounded punctate, with a few other larger coarse dystrophic calcifications, without significant change from the previous screening mammogram, which is dated 12/04/2009.  Mammographic images were processed with CAD.  On physical exam, there is a firm palpable mass in the upper outer left breast and another firm mass in the medial left breast.     01/13/2017 Breast US    Targeted ultrasound is performed, showing an irregular hypoechoic shadowing mass in the left breast at the 2:30 o'clock position, 5 cm the nipple, measuring 3.3 x 2.8 x 2.6 cm. In the 10:30 o'clock position of the left breast, 5 cm the nipple, there is irregular hypoechoic mass with somewhat more lobulated margins, corresponding to the lobulated mass seen mammographically. This measures 4.8 x 2.9 x 4.8 cm. Both masses show internal vascularity on color Doppler analysis.  Sonographic evaluation of the left axilla shows several nodes with thickened cortices. Status cortex measured is 5 mm. None of these lymph nodes appear enlarged but overall size criteria.  IMPRESSION: 1. Two masses in the left breast, 1 at the 2:30 o'clock position in the other at the 10:30 o'clock position, both highly suspicious for breast carcinoma. 2. Several abnormal left axillary lymph nodes with thickened cortices. 3. No evidence of right breast malignancy.     01/18/2017 Pathology Results    Diagnosis 1. Breast, left, needle core biopsy, 2:30 o'clock - INVASIVE MAMMARY CARCINOMA - MAMMARY CARCINOMA IN-SITU - SEE COMMENT 2. Lymph node, needle/core biopsy, left axilla - NO CARCINOMA IDENTIFIED - SEE COMMENT 3. Breast, left, needle core biopsy, 10:30 o'clock - INVASIVE DUCTAL CARCINOMA - SEE COMMENT  1. PROGNOSTIC INDICATORS Results: IMMUNOHISTOCHEMICAL AND  MORPHOMETRIC ANALYSIS PERFORMED MANUALLY Estrogen Receptor: 100%, POSITIVE, STRONG STAINING INTENSITY Progesterone Receptor:  60%, POSITIVE, STRONG STAINING INTENSITY Proliferation Marker Ki67: 60%  1. FLUORESCENCE IN-SITU HYBRIDIZATION Results: HER2 - NEGATIVE RATIO OF HER2/CEP17 SIGNALS 1.28 AVERAGE HER2 COPY NUMBER PER CELL 2.75  3. PROGNOSTIC INDICATORS Results: IMMUNOHISTOCHEMICAL AND MORPHOMETRIC ANALYSIS PERFORMED MANUALLY Estrogen Receptor: 100%, POSITIVE, STRONG STAINING INTENSITY Progesterone Receptor: 50%, POSITIVE, STRONG STAINING INTENSITY Proliferation Marker Ki67: 70%  3. FLUORESCENCE IN-SITU HYBRIDIZATION Results: HER2 - **POSITIVE** RATIO OF HER2/CEP17 SIGNALS 2.80 AVERAGE HER2 COPY NUMBER PER CELL 6.15  Microscopic Comment 1. The biopsy material shows an infiltrative proliferation of cells with large vesicular nuclei with inconspicuous nucleoli, arranged linearly and in small clusters. Based on the biopsy, the carcinoma appears Nottingham grade 2 of 3 and measures 0.9 cm in greatest linear extent. E-cadherin and prognostic markers (ER/PR/ki-67/HER2-FISH)are pending and will be reported in an addendum. Dr. Lyndon Code reviewed the case and agrees with the above diagnosis.    02/10/2017 Initial Diagnosis    Carcinoma of upper-outer quadrant of left breast in female, estrogen receptor positive ([place])    02/19/2017 Imaging    Bone Scan whole Body 02/19/17 IMPRESSION: Negative for evidence of bony metastatic disease.    02/19/2017 Imaging    CT CAP W Contrast 02/19/17 IMPRESSION: 1. Small right middle lobe pulmonary nodules up to 0.8 cm, nonspecific. Non-contrast chest CT at 3-6 months is recommended. If the nodules are stable at time of repeat CT, then future CT at 18-24 months (from today's scan) is considered optional for low-risk patients, but is recommended for high-risk patients. This recommendation follows the consensus statement: Guidelines for Management of Incidental Pulmonary Nodules Detected on CT Images: From the Fleischner Society 2017; Radiology 2017; 284:228-243. 2.  Subcentimeter hepatic lesions likely cysts but incompletely characterized due to small size. Attention on follow-up imaging recommended. 3. 2.1 cm fusiform right common iliac artery aneurysm. Continued surveillance recommended. 4. Aortic Atherosclerosis (ICD10-I70.0) and Emphysema (ICD10-J43.9).    02/23/2017 Pathology Results    Diagnosis 1. Breast, simple mastectomy, Left Total - INVASIVE DUCTAL CARCINOMA, MULTIFOCAL, NOTTINGHAM GRADE 3/3 (5.3 CM, 3.5 CM) - INVASIVE CARCINOMA INVOLVES THE DERMIS - DUCTAL CARCINOMA IN SITU, INTERMEDIATE GRADE - HYALINIZED FIBROADENOMA - MARGINS UNINVOLVED BY CARCINOMA - NO CARCINOMA IDENTIFIED IN TWO LYMPH NODES (0/2) - SEE ONCOLOGY TABLE AND COMMENT BELOW 2. Lymph node, sentinel, biopsy, Left Axillary - NO CARCINOMA IDENTIFIED IN ONE LYMPH NODE (0/1) 3. Lymph node, sentinel, biopsy - NO CARCINOMA IDENTIFIED IN ONE LYMPH NODE (0/1) 4. Lymph node, sentinel, biopsy - NO CARCINOMA IDENTIFIED IN ONE LYMPH NODE (0/1) 5. Lymph node, sentinel, biopsy - NO CARCINOMA IDENTIFIED IN ONE LYMPH NODE (0/1) Microscopic Comment 1. BREAST, INVASIVE TUMOR Procedure: Simple mastectomy Laterality: Left Tumor Size: 5. 3 cm, 3.5 cm Histologic Type: Invasive carcinoma of no special type (ductal, not otherwise specified) Grade: Nottingham Grade 3 Tubular Differentiation: 3 Nuclear Pleomorphism: 3 Mitotic Count: 2 Ductal Carcinoma in Situ (DCIS): Present Extent of Tumor: Skin: Invasive carcinoma directly invades into the dermis or epidermis without skin ulceration Margins: Invasive carcinoma, distance from closest margin: 1.5 cm (posterior) DCIS, distance from closest margin: > 1 cm Regional Lymph Nodes: Number of Lymph Nodes Examined: 6 Number of Sentinel Lymph Nodes Examined: 4 Lymph Nodes with Macrometastases: 0 Lymph Nodes with Micrometastases: 0 Lymph Nodes with Isolated Tumor Cells: 0 Treatment effect: No known presurgical therapy Breast Prognostic  Profile: See Also ([id number]) Estrogen Receptor: Positive (100%, strong); Positive (100%, strong) Progesterone Receptor: Positive (50%, strong);  Positive (50%, strong) Her2: Positive (Ratio 2.80); Negative (Ratio 1.28) Ki-67: 70%; 60% Best tumor block for sendout testing: 1E Pathologic Stage Classification (pTNM, AJCC 8th Edition): Primary Tumor: mpT3 Regional Lymph Nodes: pN0 COMMENT: The two invasive carcinomas in the breast have slightly different morphologies. The larger lesion has a papillary and micropapillary pattern admixed with typical ductal carcinoma while the smaller lesion is more typical of a ductal lesion with linear arrays (E-cadherin performed on biopsy). There are foci within the larger lesion that are concerning for lymphovascular space invasion.     04/07/2017 -  Chemotherapy    weekly taxol and herceptin x12 weeks, 04/07/17-06/23/17. Then herceptin q3 weeks for total 1 year which was switched to Kadcyla on 07/14/17.    07/20/2017 - 09/03/2017 Radiation Therapy    She started adjuvant radiation on 07/20/17 with Dr. Lisbeth Renshaw and plans to complete on 09/03/17.     08/24/2017 -  Chemotherapy    maintenance Herceptin      CURRENT THERAPY  maintenance Herceptin started 08/24/2017 and anastrazole  INTERVAL HISTORY: Penny Huynh is a 85 y.o. female who is here for follow-up. Today, she is here with her husband. She states her neuropathy is worsening in her fingers. She denies pain, just small tingling but increasing numbness. She is still able to pick up and put down small objects. She has left infected ingrown toenail and left wrist tendinitis. She started taking antibiotics yesterday for her infected ingrown toenail. She denies any other joint pain or stiffness   Pertinent positives and negatives of review of systems are listed and detailed within the above HPI.  REVIEW OF SYSTEMS:  Constitutional: Denies fevers, chills or abnormal weight loss Eyes: Denies blurriness of  vision Ears, nose, mouth, throat, and face: Denies mucositis or sore throat Respiratory: Denies cough, dyspnea or wheezes Cardiovascular: Denies palpitation, chest discomfort or lower extremity swelling Gastrointestinal:  Denies nausea, heartburn or change in bowel habits Skin: Denies abnormal skin rashes, (+) left infected ingrown toenail  Lymphatics: Denies new lymphadenopathy or easy bruising Neurological:Denies new weaknesses, (+) worsening neuropathy, increasing finger numbness  Behavioral/Psych: Mood is stable, no new changes  Musculoskeletal: (+) tendinitis on left wrist  All other systems were reviewed with the patient and are negative.  MEDICAL HISTORY:  Past Medical History:  Diagnosis Date  . Arthritis    "maybe a little in my left knee" (02/23/2017)  . Asthmatic bronchitis   . Cancer of left breast ([place])   . Coronary atherosclerosis    Minor nonobstructive CAD at cardiac catheterization 2009  . Dyslipidemia   . Femoral bruit 7/08   With normal ABIs  . Hypertension   . Lower extremity edema    Chronic  . Pneumonia ~ 1950   in 8th grade  . Type II diabetes mellitus ([place])     SURGICAL HISTORY: Past Surgical History:  Procedure Laterality Date  . APPENDECTOMY    . BREAST BIOPSY Left 02/2017  . CARDIAC CATHETERIZATION     "long time ago" (02/23/2017)  . MASTECTOMY COMPLETE / SIMPLE W/ SENTINEL NODE BIOPSY Left 02/23/2017   TOTAL MATECTOMY;  LEFT AXILLARY SENTINEL LYMPH NODE BIOPSY ERAS PATHWAY  . MASTECTOMY WITH RADIOACTIVE SEED GUIDED EXCISION AND AXILLARY SENTINEL LYMPH NODE BIOPSY Left 02/23/2017   Procedure: LEFT TOTAL MATECTOMY;  LEFT AXILLARY SENTINEL LYMPH NODE BIOPSY ERAS PATHWAY;  Surgeon: Excell Seltzer, MD;  Location: [facility];  Service: General;  Laterality: Left;  . PILONIDAL CYST EXCISION    . PORTA CATH INSERTION  02/23/2017  . PORTACATH PLACEMENT N/A 02/23/2017   Procedure: INSERTION PORT-A-CATH;  Surgeon: Excell Seltzer, MD;  Location: [facility];   Service: General;  Laterality: N/A;  . SHOULDER ARTHROSCOPY W/ ROTATOR CUFF REPAIR Right     I have reviewed the social history and family history with the patient and they are unchanged from previous note.  ALLERGIES:  has No Known Allergies.  MEDICATIONS:  Current Outpatient Medications  Medication Sig Dispense Refill  . amLODipine (NORVASC) 5 MG tablet TAKE 1 TABLET BY MOUTH ONCE DAILY 90 tablet 0  . anastrozole (ARIMIDEX) 1 MG tablet Take 1 tablet (1 mg total) by mouth daily. 90 tablet 1  . aspirin 81 MG EC tablet Take 81 mg by mouth at bedtime.     . chlorthalidone (HYGROTON) 25 MG tablet Take 25 mg by mouth daily.     . Coenzyme Q10 (COQ10) 100 MG CAPS Take 100 mg by mouth daily.      . fluorometholone (FML) 0.1 % ophthalmic suspension Place 1 drop into both eyes every 4 (four) hours.    Marland Kitchen latanoprost (XALATAN) 0.005 % ophthalmic solution Place 1 drop into both eyes at bedtime.    . lidocaine-prilocaine (EMLA) cream APPLY CREAM TOPICALLY TO AFFECTED AREA ONCE  3  . losartan (COZAAR) 50 MG tablet Take 1 tablet (50 mg total) by mouth 2 (two) times daily. 180 tablet 3  . metFORMIN (GLUCOPHAGE) 500 MG tablet Take 1,000 mg by mouth daily with breakfast.     . traMADol (ULTRAM) 50 MG tablet Take 1 tablet (50 mg total) by mouth every 6 (six) hours as needed for moderate pain (neuropathic pain). 60 tablet 0  . vitamin B-12 (CYANOCOBALAMIN) 1000 MCG tablet Take 1,000 mcg by mouth daily.     No current facility-administered medications for this visit.     PHYSICAL EXAMINATION: ECOG PERFORMANCE STATUS: 2 - Symptomatic, <50% confined to bed  Vitals:   04/21/18 1046  BP: 136/74  Pulse: 76  Resp: 18  Temp: 97.8 F (36.6 C)  SpO2: 98%   Filed Weights   04/21/18 1046  Weight: 175 lb 12.8 oz (79.7 kg)    GENERAL:alert, no distress and comfortable SKIN: skin color, texture, turgor are normal, no rashes or significant lesions EYES: normal, Conjunctiva are pink and non-injected,  sclera clear OROPHARYNX:no exudate, no erythema and lips, buccal mucosa, and tongue normal  NECK: supple, thyroid normal size, non-tender, without nodularity LYMPH:  no palpable lymphadenopathy in the cervical, axillary or inguinal LUNGS: clear to auscultation and percussion with normal breathing effort HEART: regular rate & rhythm and no murmurs, (+) left leg lower extremity edema ABDOMEN:abdomen soft, non-tender and normal bowel sounds Musculoskeletal:no cyanosis of digits and no clubbing  NEURO: alert & oriented x 3 with fluent speech, no focal motor/sensory deficits BREAST: normal, no palpable mass   LABORATORY DATA:  I have reviewed the data as listed CBC Latest Ref Rng & Units 04/21/2018 04/05/2018 03/09/2018  WBC 4.0 - 10.5 K/uL 3.6(L) 3.7(L) 3.0(L)  Hemoglobin 12.0 - 15.0 g/dL 11.4(L) 11.9(L) 11.4(L)  Hematocrit 36.0 - 46.0 % 36.0 36.8 35.4(L)  Platelets 150 - 400 K/uL 227 236 232     CMP Latest Ref Rng & Units 04/21/2018 04/05/2018 03/09/2018  Glucose 70 - 99 mg/dL 170(H) 71 137(H)  BUN 8 - 23 mg/dL _0 Creatinine 0.44 - 1.00 mg/dL 0.81 0.78 0.78  Sodium 135 - 145 mmol/L 133(L) 133(L) 132(L)  Potassium 3.5 - 5.1 mmol/L 4.2 3.7 3.9  Chloride 98 - 111 mmol/L 98 97(L) 95(L)  CO2 22 - 32 mmol/L _0 Calcium 8.9 - 10.3 mg/dL 9.1 9.5 9.6  Total Protein 6.5 - 8.1 g/dL 6.9 7.1 6.9  Total Bilirubin 0.3 - 1.2 mg/dL 0.6 1.0 0.9  Alkaline Phos 38 - 126 U/L 79 72 74  AST 15 - 41 U/L _1 ALT 0 - 44 U/L _2 RADIOGRAPHIC STUDIES: I have personally reviewed the radiological images as listed and agreed with the findings in the report. No results found.   ASSESSMENT & PLAN:  HARPER VANDERVOORT is a 85 y.o. female with history of  1.Multifocal invasive ductal carcinoma of the left breast of female;carcinoma of the upper-outer quadrant of left breast and female, ER/PR positive, HER-2 negative pT2N0M0, G2, Stage 1B; IDC of the upper-inner quadrant left breast,  ER+/PR+/HER2+, pT3N0M0 G3, stage IB -Diagnosed in 2018. Treated with surgery first, followed by adjuvant chemo, radiation, and hormonal therapy.  She developed a severe peripheral neuropathy from adjuvant Taxol and stopped earlier.  She did try TDM1, and stopped due to worsening neuropathy.  She is currently on maintenance Herceptin and adjuvant anastrozole. Tolerating well. -She is tolerating anastrozole well, will continue for 5 years -Labs reviewed, she is adequate to continue with last treatment of Herceptin next week.  - We will plan to remove port in 3-4 months  - I encouraged her to take oral Vitamin D  -She is clinically doing well, exam was unremarkable, last mammogram in January 2020 was unremarkable, no clinical concern for recurrence.  We will continue breast cancer surveillance.  - F/u in 4 months    2. Peripheral neuropathy, G2, secondary to taxol, DM and T-DM1 -Follow-up with Dr. Mickeal Skinner - Multiple neuropathic pain drugs have not improved her sx, including Gabapentin, Amytryptilline, Pregabalin, and Lidocaine  - She required a walker for ambulation, uses a crane lately  - She is currently on Tramadol 75m QID PRN  - She started physical therapy for muscle weakness  - The numbness in her fingers is stable, less tingling lately, no significant pain   3. HTN, CAD, DM -f/u with PCP  4. Bone health -She has not had a bone density scan for several years -I will order a bone density scan at the breast center for next months -We discussed potential side effect of osteopenia and osteoporosis from anastrozole, I encouraged her to continue calcium and vitamin D supplement  Plan -Labs reviewed, adequate to proceed with last dose of Herceptin next week  -f/u with cardiology and last echo in a month  - Lab and f/u in 4 months  [facility]  -Continue anastrozole   No problem-specific Assessment & Plan notes found for this encounter.   Orders Placed This  Encounter  Procedures  . DG Bone Density    Standing Status:   Future    Standing Expiration Date:   04/21/2019    Order Specific Question:   Reason for Exam (SYMPTOM  OR DIAGNOSIS REQUIRED)    Answer:   screening    Order Specific Question:   [facility]    [facility]  All questions were answered. The patient knows to call the clinic with any problems, questions or concerns. No barriers to learning was detected. I spent 20 minutes counseling the patient face to face. The total time spent in the appointment was 25 minutes  and more than 50% was on counseling and review of test results  I, Manson Allan am acting as scribe for Dr. Truitt Merle.  I have reviewed the above documentation for accuracy and completeness, and I agree with the above.     Truitt Merle, MD 04/21/2018

## 2018-04-21 ENCOUNTER — Inpatient Hospital Stay: Payer: Medicare Other

## 2018-04-21 ENCOUNTER — Encounter: Payer: Self-pay | Admitting: Hematology

## 2018-04-21 ENCOUNTER — Telehealth: Payer: Self-pay | Admitting: Hematology

## 2018-04-21 ENCOUNTER — Other Ambulatory Visit: Payer: Self-pay

## 2018-04-21 ENCOUNTER — Inpatient Hospital Stay (HOSPITAL_BASED_OUTPATIENT_CLINIC_OR_DEPARTMENT_OTHER): Payer: Medicare Other | Admitting: Hematology

## 2018-04-21 VITALS — BP 136/74 | HR 76 | Temp 97.8°F | Resp 18 | Ht 64.0 in | Wt 175.8 lb

## 2018-04-21 DIAGNOSIS — C50412 Malignant neoplasm of upper-outer quadrant of left female breast: Secondary | ICD-10-CM

## 2018-04-21 DIAGNOSIS — Z794 Long term (current) use of insulin: Secondary | ICD-10-CM

## 2018-04-21 DIAGNOSIS — C50212 Malignant neoplasm of upper-inner quadrant of left female breast: Secondary | ICD-10-CM | POA: Diagnosis not present

## 2018-04-21 DIAGNOSIS — E1142 Type 2 diabetes mellitus with diabetic polyneuropathy: Secondary | ICD-10-CM | POA: Diagnosis not present

## 2018-04-21 DIAGNOSIS — Z9221 Personal history of antineoplastic chemotherapy: Secondary | ICD-10-CM | POA: Diagnosis not present

## 2018-04-21 DIAGNOSIS — Z170 Estrogen receptor positive status [ER+]: Principal | ICD-10-CM

## 2018-04-21 DIAGNOSIS — Z95828 Presence of other vascular implants and grafts: Secondary | ICD-10-CM

## 2018-04-21 DIAGNOSIS — Z79899 Other long term (current) drug therapy: Secondary | ICD-10-CM

## 2018-04-21 DIAGNOSIS — G620 Drug-induced polyneuropathy: Secondary | ICD-10-CM | POA: Diagnosis not present

## 2018-04-21 DIAGNOSIS — Z923 Personal history of irradiation: Secondary | ICD-10-CM

## 2018-04-21 DIAGNOSIS — T451X5S Adverse effect of antineoplastic and immunosuppressive drugs, sequela: Secondary | ICD-10-CM

## 2018-04-21 DIAGNOSIS — E2839 Other primary ovarian failure: Secondary | ICD-10-CM

## 2018-04-21 DIAGNOSIS — Z5112 Encounter for antineoplastic immunotherapy: Secondary | ICD-10-CM | POA: Diagnosis not present

## 2018-04-21 LAB — CBC WITH DIFFERENTIAL (CANCER CENTER ONLY)
Abs Immature Granulocytes: 0.01 10*3/uL (ref 0.00–0.07)
BASOS ABS: 0 10*3/uL (ref 0.0–0.1)
Basophils Relative: 1 %
Eosinophils Absolute: 0.1 10*3/uL (ref 0.0–0.5)
Eosinophils Relative: 4 %
HCT: 36 % (ref 36.0–46.0)
Hemoglobin: 11.4 g/dL — ABNORMAL LOW (ref 12.0–15.0)
Immature Granulocytes: 0 %
Lymphocytes Relative: 26 %
Lymphs Abs: 0.9 10*3/uL (ref 0.7–4.0)
MCH: 28.7 pg (ref 26.0–34.0)
MCHC: 31.7 g/dL (ref 30.0–36.0)
MCV: 90.7 fL (ref 80.0–100.0)
Monocytes Absolute: 0.4 10*3/uL (ref 0.1–1.0)
Monocytes Relative: 10 %
NEUTROS ABS: 2.1 10*3/uL (ref 1.7–7.7)
Neutrophils Relative %: 59 %
Platelet Count: 227 10*3/uL (ref 150–400)
RBC: 3.97 MIL/uL (ref 3.87–5.11)
RDW: 13.7 % (ref 11.5–15.5)
WBC Count: 3.6 10*3/uL — ABNORMAL LOW (ref 4.0–10.5)
nRBC: 0 % (ref 0.0–0.2)

## 2018-04-21 LAB — CMP (CANCER CENTER ONLY)
ALT: 19 U/L (ref 0–44)
AST: 20 U/L (ref 15–41)
Albumin: 3.6 g/dL (ref 3.5–5.0)
Alkaline Phosphatase: 79 U/L (ref 38–126)
Anion gap: 9 (ref 5–15)
BUN: 15 mg/dL (ref 8–23)
CO2: 26 mmol/L (ref 22–32)
Calcium: 9.1 mg/dL (ref 8.9–10.3)
Chloride: 98 mmol/L (ref 98–111)
Creatinine: 0.81 mg/dL (ref 0.44–1.00)
GFR, Est AFR Am: >60 mL/min (ref >60)
GFR, Estimated: >60 mL/min (ref >60)
Glucose, Bld: 170 mg/dL — ABNORMAL HIGH (ref 70–99)
Potassium: 4.2 mmol/L (ref 3.5–5.1)
SODIUM: 133 mmol/L — AB (ref 135–145)
Total Bilirubin: 0.6 mg/dL (ref 0.3–1.2)
Total Protein: 6.9 g/dL (ref 6.5–8.1)

## 2018-04-21 MED ORDER — HEPARIN SOD (PORK) LOCK FLUSH 100 UNIT/ML IV SOLN
500.0000 [IU] | Freq: Once | INTRAVENOUS | Status: AC
  Administered 2018-04-21: 500 [IU] via INTRAVENOUS
  Filled 2018-04-21: qty 5

## 2018-04-21 MED ORDER — SODIUM CHLORIDE 0.9% FLUSH
10.0000 mL | INTRAVENOUS | Status: DC | PRN
  Administered 2018-04-21: 10 mL
  Filled 2018-04-21: qty 10

## 2018-04-21 NOTE — Telephone Encounter (Signed)
Scheduled appt per 3/19 los. ° °Printed calendar and avs. °

## 2018-04-22 ENCOUNTER — Encounter: Payer: Medicare Other | Admitting: Physical Therapy

## 2018-04-26 ENCOUNTER — Encounter: Payer: Medicare Other | Admitting: Physical Therapy

## 2018-04-27 ENCOUNTER — Telehealth: Payer: Self-pay

## 2018-04-27 ENCOUNTER — Other Ambulatory Visit: Payer: Self-pay

## 2018-04-27 ENCOUNTER — Inpatient Hospital Stay: Payer: Medicare Other

## 2018-04-27 VITALS — BP 168/74 | HR 69 | Temp 98.1°F | Resp 18 | Ht 64.0 in | Wt 174.0 lb

## 2018-04-27 DIAGNOSIS — T451X5S Adverse effect of antineoplastic and immunosuppressive drugs, sequela: Secondary | ICD-10-CM | POA: Diagnosis not present

## 2018-04-27 DIAGNOSIS — Z79899 Other long term (current) drug therapy: Secondary | ICD-10-CM

## 2018-04-27 DIAGNOSIS — G620 Drug-induced polyneuropathy: Secondary | ICD-10-CM | POA: Diagnosis not present

## 2018-04-27 DIAGNOSIS — Z5112 Encounter for antineoplastic immunotherapy: Secondary | ICD-10-CM | POA: Diagnosis not present

## 2018-04-27 DIAGNOSIS — Z170 Estrogen receptor positive status [ER+]: Principal | ICD-10-CM

## 2018-04-27 DIAGNOSIS — C50212 Malignant neoplasm of upper-inner quadrant of left female breast: Secondary | ICD-10-CM | POA: Diagnosis not present

## 2018-04-27 DIAGNOSIS — C50412 Malignant neoplasm of upper-outer quadrant of left female breast: Secondary | ICD-10-CM | POA: Diagnosis not present

## 2018-04-27 MED ORDER — HEPARIN SOD (PORK) LOCK FLUSH 100 UNIT/ML IV SOLN
500.0000 [IU] | Freq: Once | INTRAVENOUS | Status: AC | PRN
  Administered 2018-04-27: 500 [IU]
  Filled 2018-04-27: qty 5

## 2018-04-27 MED ORDER — DIPHENHYDRAMINE HCL 25 MG PO CAPS
50.0000 mg | ORAL_CAPSULE | Freq: Once | ORAL | Status: AC
  Administered 2018-04-27: 50 mg via ORAL

## 2018-04-27 MED ORDER — DIPHENHYDRAMINE HCL 25 MG PO CAPS
ORAL_CAPSULE | ORAL | Status: AC
  Filled 2018-04-27: qty 2

## 2018-04-27 MED ORDER — TRASTUZUMAB CHEMO 150 MG IV SOLR
450.0000 mg | Freq: Once | INTRAVENOUS | Status: AC
  Administered 2018-04-27: 450 mg via INTRAVENOUS
  Filled 2018-04-27: qty 21.43

## 2018-04-27 MED ORDER — SODIUM CHLORIDE 0.9% FLUSH
10.0000 mL | INTRAVENOUS | Status: DC | PRN
  Administered 2018-04-27: 10 mL
  Filled 2018-04-27: qty 10

## 2018-04-27 MED ORDER — SODIUM CHLORIDE 0.9 % IV SOLN
Freq: Once | INTRAVENOUS | Status: AC
  Administered 2018-04-27: 08:00:00 via INTRAVENOUS
  Filled 2018-04-27: qty 250

## 2018-04-27 MED ORDER — ACETAMINOPHEN 325 MG PO TABS
ORAL_TABLET | ORAL | Status: AC
  Filled 2018-04-27: qty 2

## 2018-04-27 MED ORDER — ACETAMINOPHEN 325 MG PO TABS
650.0000 mg | ORAL_TABLET | Freq: Once | ORAL | Status: AC
  Administered 2018-04-27: 650 mg via ORAL

## 2018-04-27 NOTE — Telephone Encounter (Signed)
Order placed, Alphonsus Sias to schedule

## 2018-04-27 NOTE — Patient Instructions (Signed)
Coronavirus (COVID-19) Are you at risk?  Are you at risk for the Coronavirus (COVID-19)?  To be considered HIGH RISK for Coronavirus (COVID-19), you have to meet the following criteria:  . Traveled to [place], [place], [place], [place] or [place]; or in the [place] to [place], [place], [place], or [place]; and have fever, cough, and shortness of breath within the last 2 weeks of travel OR . Been in close contact with a person diagnosed with COVID-19 within the last 2 weeks and have fever, cough, and shortness of breath . IF YOU DO NOT MEET THESE CRITERIA, YOU ARE CONSIDERED LOW RISK FOR COVID-19.  What to do if you are HIGH RISK for COVID-19?  . If you are having a medical emergency, call 911. . Seek medical care right away. Before you go to a doctor's office, urgent care or emergency department, call ahead and tell them about your recent travel, contact with someone diagnosed with COVID-19, and your symptoms. You should receive instructions from your physician's office regarding next steps of care.  . When you arrive at healthcare provider, tell the healthcare staff immediately you have returned from visiting [place], [place], [place], [place] or [place]; or traveled in the [place] to [place], [place], [place], or [place]; in the last two weeks or you have been in close contact with a person diagnosed with COVID-19 in the last 2 weeks.   . Tell the health care staff about your symptoms: fever, cough and shortness of breath. . After you have been seen by a medical provider, you will be either: o Tested for (COVID-19) and discharged home on quarantine except to seek medical care if symptoms worsen, and asked to  - Stay home and avoid contact with others until you get your results (4-5 days)  - Avoid travel on public transportation if possible (such as bus, train, or airplane) or o Sent to the Emergency Department by EMS for evaluation, COVID-19 testing, and possible  admission depending on your condition and test results.  What to do if you are LOW RISK for COVID-19?  Reduce your risk of any infection by using the same precautions used for avoiding the common cold or flu:  . Wash your hands often with soap and warm water for at least 20 seconds.  If soap and water are not readily available, use an alcohol-based hand sanitizer with at least 60% alcohol.  . If coughing or sneezing, cover your mouth and nose by coughing or sneezing into the elbow areas of your shirt or coat, into a tissue or into your sleeve (not your hands). . Avoid shaking hands with others and consider head nods or verbal greetings only. . Avoid touching your eyes, nose, or mouth with unwashed hands.  . Avoid close contact with people who are sick. . Avoid places or events with large numbers of people in one location, like concerts or sporting events. . Carefully consider travel plans you have or are making. . If you are planning any travel outside or inside the [place], visit the CDC's Travelers' Health webpage for the latest health notices. . If you have some symptoms but not all symptoms, continue to monitor at home and seek medical attention if your symptoms worsen. . If you are having a medical emergency, call 911.  ADDITIONAL HEALTHCARE OPTIONS FOR PATIENTS  [facility] / e-Visit: [email or web address]         [facility]: [phone]  [facility]: [phone]                   [facility]: [phone]   [facility] Discharge Instructions for Patients Receiving Chemotherapy  Today you received the following chemotherapy agents: Trastuzumab (Herceptin)  To help prevent nausea and vomiting after your treatment, we encourage you to take your nausea medication as directed.    If you develop nausea and vomiting that is not controlled by your nausea medication, call the clinic.    BELOW ARE SYMPTOMS THAT SHOULD BE REPORTED IMMEDIATELY:  *FEVER GREATER THAN 100.5 F  *CHILLS WITH OR WITHOUT FEVER  NAUSEA AND VOMITING THAT IS NOT CONTROLLED WITH YOUR NAUSEA MEDICATION  *UNUSUAL SHORTNESS OF BREATH  *UNUSUAL BRUISING OR BLEEDING  TENDERNESS IN MOUTH AND THROAT WITH OR WITHOUT PRESENCE OF ULCERS  *URINARY PROBLEMS  *BOWEL PROBLEMS  UNUSUAL RASH Items with * indicate a potential emergency and should be followed up as soon as possible.  Feel free to call the clinic should you have any questions or concerns. The clinic phone number is [phone].  Please show the CHEMO ALERT CARD at check-in to the Emergency Department and triage nurse.   

## 2018-04-27 NOTE — Telephone Encounter (Signed)
-----   Message from [name], MD sent at 04/27/2018  8:56 AM EDT ----- Regarding: RE: ECHO Due Soon - Didn't See on Schedule Yes, I'll take care of echo.  Cathey, can you please make sure this gets done? Jearld Fenton, I'm CC you too as this is a necessary echo for her chemotherapy. Thanks.  Dr. Bronson Ing ----- Message ----- From: Truitt Merle, MD Sent: 04/27/2018   8:34 AM EDT To: Jonnie Finner, RN, [name], MD, # Subject: RE: ECHO Due Soon - Didn't See on Schedule     Dr. Bronson Ing,  Do you plan to see her soon with repeated echo? Her last echo was on 02/01/2018, she is due now, could you repeat it within 2-3 weeks?  Thanks  Truitt Merle  ----- Message ----- From: Delane Ginger, [facility] Sent: 04/27/2018   8:24 AM EDT To: Jonnie Finner, RN, Truitt Merle, MD Subject: ECHO Due Soon - Didn't See on Schedule         Hi [name],   I see this patient is getting her last Hercptin dose today and is supposed to have an ECHO/cards follow up in about a month. I just wanted to let you know that I didn't see if on her appointment schedule when I checked today in case it needs to get scheduled again.   Thanks! Maggie, Rx

## 2018-04-29 ENCOUNTER — Encounter: Payer: Medicare Other | Admitting: Physical Therapy

## 2018-05-03 ENCOUNTER — Encounter: Payer: Medicare Other | Admitting: Physical Therapy

## 2018-05-23 ENCOUNTER — Other Ambulatory Visit: Payer: Self-pay | Admitting: Internal Medicine

## 2018-05-27 ENCOUNTER — Ambulatory Visit (HOSPITAL_COMMUNITY): Admission: RE | Admit: 2018-05-27 | Payer: Medicare Other | Source: Ambulatory Visit

## 2018-05-27 ENCOUNTER — Encounter: Payer: Self-pay | Admitting: General Practice

## 2018-05-27 NOTE — Progress Notes (Signed)
[place] Team contacted patient to assess for food insecurity and other psychosocial needs during current COVID19 pandemic.  "So are, we've been able to get what we needed."  Lives w spouse, had aide two days/week but has currently paused service until virus passes.  No other concerns at this time.  Patient/family expressed no needs at this time.  Support Team member encouraged patient to call if changes occur or they have any other questions/concerns.   Beverely Pace, [place]

## 2018-06-07 ENCOUNTER — Telehealth: Payer: Self-pay | Admitting: Internal Medicine

## 2018-06-07 ENCOUNTER — Inpatient Hospital Stay: Payer: Medicare Other | Attending: Hematology | Admitting: Internal Medicine

## 2018-06-07 NOTE — Telephone Encounter (Signed)
No los per 5/5 los.

## 2018-06-08 ENCOUNTER — Ambulatory Visit: Payer: Medicare Other | Admitting: Internal Medicine

## 2018-06-22 ENCOUNTER — Telehealth: Payer: Self-pay | Admitting: *Deleted

## 2018-06-22 NOTE — Telephone Encounter (Signed)
"  Penny Huynh [id number]).  Need to speak with Dr.Vaslow's nurse.  Didn't hear from him about an appointment with Dr. Mickeal Skinner on May 5th.  Expecting and would like a call from someone."

## 2018-06-27 ENCOUNTER — Other Ambulatory Visit (HOSPITAL_COMMUNITY): Payer: Medicare Other

## 2018-07-04 ENCOUNTER — Telehealth: Payer: Self-pay

## 2018-07-04 DIAGNOSIS — R0602 Shortness of breath: Secondary | ICD-10-CM | POA: Diagnosis not present

## 2018-07-04 DIAGNOSIS — R05 Cough: Secondary | ICD-10-CM | POA: Diagnosis not present

## 2018-07-04 DIAGNOSIS — R0600 Dyspnea, unspecified: Secondary | ICD-10-CM | POA: Diagnosis not present

## 2018-07-04 NOTE — Telephone Encounter (Signed)
Patient calls with increased shortness of breath, denies fever, denies cough, no chest pain, such shortness of breath especially on exertion.  Patient has PCP and cardiologist.  I recommended she follow up asap with one of these providers.  She verbalized an understanding and states she will call today.

## 2018-07-05 DIAGNOSIS — Z20828 Contact with and (suspected) exposure to other viral communicable diseases: Secondary | ICD-10-CM | POA: Diagnosis not present

## 2018-07-06 ENCOUNTER — Other Ambulatory Visit: Payer: Medicare Other

## 2018-07-08 ENCOUNTER — Ambulatory Visit (HOSPITAL_COMMUNITY)
Admission: RE | Admit: 2018-07-08 | Discharge: 2018-07-08 | Disposition: A | Discharge status: Home / Self Care | Payer: Medicare Other | Source: Ambulatory Visit | Attending: Cardiovascular Disease | Admitting: Cardiovascular Disease

## 2018-07-08 ENCOUNTER — Other Ambulatory Visit: Payer: Self-pay

## 2018-07-08 DIAGNOSIS — Z79899 Other long term (current) drug therapy: Secondary | ICD-10-CM | POA: Insufficient documentation

## 2018-07-08 NOTE — Progress Notes (Signed)
*  PRELIMINARY RESULTS* Echocardiogram 2D Echocardiogram has been performed.  Penny Huynh 07/08/2018, 11:42 AM

## 2018-07-12 ENCOUNTER — Ambulatory Visit
Admission: RE | Admit: 2018-07-12 | Discharge: 2018-07-12 | Disposition: A | Discharge status: Home / Self Care | Payer: Medicare Other | Source: Ambulatory Visit | Attending: Hematology | Admitting: Hematology

## 2018-07-12 ENCOUNTER — Other Ambulatory Visit: Payer: Self-pay

## 2018-07-12 DIAGNOSIS — Z780 Asymptomatic menopausal state: Secondary | ICD-10-CM | POA: Diagnosis not present

## 2018-07-12 DIAGNOSIS — M810 Age-related osteoporosis without current pathological fracture: Secondary | ICD-10-CM | POA: Diagnosis not present

## 2018-07-12 DIAGNOSIS — E2839 Other primary ovarian failure: Secondary | ICD-10-CM

## 2018-07-13 DIAGNOSIS — M654 Radial styloid tenosynovitis [de Quervain]: Secondary | ICD-10-CM | POA: Diagnosis not present

## 2018-07-13 DIAGNOSIS — I10 Essential (primary) hypertension: Secondary | ICD-10-CM | POA: Diagnosis not present

## 2018-07-13 DIAGNOSIS — E1165 Type 2 diabetes mellitus with hyperglycemia: Secondary | ICD-10-CM | POA: Diagnosis not present

## 2018-07-13 DIAGNOSIS — J45909 Unspecified asthma, uncomplicated: Secondary | ICD-10-CM | POA: Diagnosis not present

## 2018-07-13 DIAGNOSIS — E871 Hypo-osmolality and hyponatremia: Secondary | ICD-10-CM | POA: Insufficient documentation

## 2018-07-13 DIAGNOSIS — E782 Mixed hyperlipidemia: Secondary | ICD-10-CM | POA: Diagnosis not present

## 2018-07-13 DIAGNOSIS — E1140 Type 2 diabetes mellitus with diabetic neuropathy, unspecified: Secondary | ICD-10-CM | POA: Diagnosis not present

## 2018-07-13 DIAGNOSIS — Z6830 Body mass index (BMI) 30.0-30.9, adult: Secondary | ICD-10-CM | POA: Diagnosis not present

## 2018-07-14 ENCOUNTER — Telehealth: Payer: Self-pay | Admitting: *Deleted

## 2018-07-14 NOTE — Telephone Encounter (Signed)
Attempted to call pt to let her know of DEXA scan results.  Unable to leave message due to phone just ringing and no voice mail.

## 2018-07-14 NOTE — Telephone Encounter (Signed)
Spoke with pt and informed pt of DEXA scan results as per Dr. Ernestina Penna instructions.  Informed pt that MD will discuss further medical treatment options with pt at next office visit in July.  Pt voiced understanding.

## 2018-07-14 NOTE — Telephone Encounter (Signed)
-----   Message from Truitt Merle, MD sent at 07/14/2018  7:51 AM EDT ----- Please let pt know her DEXA scan results, and I will discuss medical treatment options for her osteoporosis on her next visit, thanks   Truitt Merle  07/14/2018

## 2018-07-26 ENCOUNTER — Other Ambulatory Visit: Payer: Self-pay | Admitting: Internal Medicine

## 2018-07-27 DIAGNOSIS — E876 Hypokalemia: Secondary | ICD-10-CM | POA: Diagnosis not present

## 2018-07-27 DIAGNOSIS — R42 Dizziness and giddiness: Secondary | ICD-10-CM | POA: Diagnosis not present

## 2018-07-27 DIAGNOSIS — E1165 Type 2 diabetes mellitus with hyperglycemia: Secondary | ICD-10-CM | POA: Diagnosis not present

## 2018-08-12 ENCOUNTER — Other Ambulatory Visit: Payer: Self-pay

## 2018-08-12 DIAGNOSIS — I739 Peripheral vascular disease, unspecified: Secondary | ICD-10-CM

## 2018-08-16 ENCOUNTER — Telehealth: Payer: Self-pay | Admitting: Hematology

## 2018-08-16 NOTE — Telephone Encounter (Signed)
Called pt 7/14 sch message - unable to reach pt . Left message for patient to reschedule appt.

## 2018-08-16 NOTE — Telephone Encounter (Signed)
YF out 7/20 moved appointments to 8/7. Confirmed with patient. Date per patient to coordinate with other appointments in [place].

## 2018-08-18 ENCOUNTER — Encounter: Payer: Self-pay | Admitting: *Deleted

## 2018-08-19 ENCOUNTER — Encounter: Payer: Self-pay | Admitting: Vascular Surgery

## 2018-08-19 ENCOUNTER — Ambulatory Visit (INDEPENDENT_AMBULATORY_CARE_PROVIDER_SITE_OTHER): Payer: Medicare Other | Admitting: Vascular Surgery

## 2018-08-19 ENCOUNTER — Other Ambulatory Visit: Payer: Self-pay

## 2018-08-19 ENCOUNTER — Ambulatory Visit (HOSPITAL_COMMUNITY)
Admission: RE | Admit: 2018-08-19 | Discharge: 2018-08-19 | Disposition: A | Discharge status: Home / Self Care | Payer: Medicare Other | Source: Ambulatory Visit | Attending: Family | Admitting: Family

## 2018-08-19 VITALS — BP 177/86 | HR 72 | Temp 97.8°F | Resp 14 | Ht 63.0 in | Wt 180.1 lb

## 2018-08-19 DIAGNOSIS — I739 Peripheral vascular disease, unspecified: Secondary | ICD-10-CM | POA: Insufficient documentation

## 2018-08-19 NOTE — Progress Notes (Signed)
Patient ID: Penny Huynh, female   DOB: 1933/06/27, 85 y.o.   MRN: [id number]  Reason for Consult: PVD (Pt said that she was seeing Dr Blanch Media regarding an ingrown toenail and he told her that she needed to be evaluated for PVD. She denies any pain or swelling in her lower extremities. )   Referred by Manon Hilding, MD  Subjective:     HPI:  Penny Huynh is a 85 y.o. female concern for peripheral vascular disease.  She is never had any lower extremity interventions.  Has a painful ingrown toenail on the left did not have palpable pulses and was sent for evaluation.  Denies any leg pain.  Does have a known right common iliac artery aneurysm which has been small.  Is able to walk without limitation at this time.  States that she has no surgery scheduled on her feet but would like to have it possibly in the future.  She is a former smoker but quit 20 years ago.  Has high cholesterol, hypertension and diabetic.  Past Medical History:  Diagnosis Date  . Arthritis    "maybe a little in my left knee" (02/23/2017)  . Asthmatic bronchitis   . Cancer of left breast ([place])   . Coronary atherosclerosis    Minor nonobstructive CAD at cardiac catheterization 2009  . Dyslipidemia   . Femoral bruit 7/08   With normal ABIs  . Hypertension   . Iliac artery aneurysm ([place])   . Lower extremity edema    Chronic  . Pneumonia ~ 1950   in 8th grade  . Type II diabetes mellitus ([facility])    Family History  Problem Relation Age of Onset  . Diabetes Mother   . Stroke Father   . Cancer Father        unknown type    Past Surgical History:  Procedure Laterality Date  . APPENDECTOMY    . BREAST BIOPSY Left 02/2017  . CARDIAC CATHETERIZATION     "long time ago" (02/23/2017)  . MASTECTOMY COMPLETE / SIMPLE W/ SENTINEL NODE BIOPSY Left 02/23/2017   TOTAL MATECTOMY;  LEFT AXILLARY SENTINEL LYMPH NODE BIOPSY ERAS PATHWAY  . MASTECTOMY WITH RADIOACTIVE SEED GUIDED EXCISION AND AXILLARY SENTINEL LYMPH  NODE BIOPSY Left 02/23/2017   Procedure: LEFT TOTAL MATECTOMY;  LEFT AXILLARY SENTINEL LYMPH NODE BIOPSY ERAS PATHWAY;  Surgeon: Excell Seltzer, MD;  Location: [facility];  Service: General;  Laterality: Left;  . PILONIDAL CYST EXCISION    . PORTA CATH INSERTION  02/23/2017  . PORTACATH PLACEMENT N/A 02/23/2017   Procedure: INSERTION PORT-A-CATH;  Surgeon: Excell Seltzer, MD;  Location: [facility];  Service: General;  Laterality: N/A;  . SHOULDER ARTHROSCOPY W/ ROTATOR CUFF REPAIR Right     Short Social History:  Social History   Tobacco Use  . Smoking status: Former Smoker    Packs/day: 2.00    Years: 46.00    Pack years: 92.00    Types: Cigarettes    Start date: 11/09/1953    Quit date: 2000    Years since quitting: 20.5  . Smokeless tobacco: Never Used  Substance Use Topics  . Alcohol use: Yes    Alcohol/week: 2.0 standard drinks    Types: 2 Glasses of wine per week    No Known Allergies  Current Outpatient Medications  Medication Sig Dispense Refill  . amLODipine (NORVASC) 5 MG tablet TAKE 1 TABLET BY MOUTH ONCE DAILY 90 tablet 0  . anastrozole (ARIMIDEX) 1 MG tablet  Take 1 tablet (1 mg total) by mouth daily. 90 tablet 1  . aspirin 81 MG EC tablet Take 81 mg by mouth at bedtime.     . chlorthalidone (HYGROTON) 25 MG tablet Take 25 mg by mouth daily.     . Coenzyme Q10 (COQ10) 100 MG CAPS Take 100 mg by mouth daily.      . fluorometholone (FML) 0.1 % ophthalmic suspension Place 1 drop into both eyes every 4 (four) hours.    Marland Kitchen latanoprost (XALATAN) 0.005 % ophthalmic solution Place 1 drop into both eyes at bedtime.    . lidocaine-prilocaine (EMLA) cream APPLY CREAM TOPICALLY TO AFFECTED AREA ONCE  3  . losartan (COZAAR) 50 MG tablet Take 1 tablet (50 mg total) by mouth 2 (two) times daily. 180 tablet 3  . metFORMIN (GLUCOPHAGE) 500 MG tablet Take 1,000 mg by mouth daily with breakfast.     . pregabalin (LYRICA) 75 MG capsule TAKE 1 CAPSULE BY MOUTH TWICE A DAY FOR 7 DAYS THEN 2  TWICE A DAY 120 capsule 1  . traMADol (ULTRAM) 50 MG tablet Take 1 tablet (50 mg total) by mouth every 6 (six) hours as needed for moderate pain (neuropathic pain). 60 tablet 0  . vitamin B-12 (CYANOCOBALAMIN) 1000 MCG tablet Take 1,000 mcg by mouth daily.     No current facility-administered medications for this visit.     Review of Systems  Constitutional:  Constitutional negative. HENT: HENT negative.  Eyes: Eyes negative.  Respiratory: Respiratory negative.  Cardiovascular: Cardiovascular negative.  GI: Gastrointestinal negative.  Musculoskeletal:       Left toenail pain Skin: Skin negative.  Neurological: Neurological negative. Hematologic: Hematologic/lymphatic negative.  Psychiatric: Psychiatric negative.        Objective:  Objective   Vitals:   08/19/18 1329  BP: (!) 177/86  Pulse: 72  Resp: 14  Temp: 97.8 F (36.6 C)  TempSrc: Temporal  SpO2: 100%  Weight: 180 lb 1.6 oz (81.7 kg)  Height: 5\' 3"  (1.6 m)   Body mass index is 31.9 kg/m.  Physical Exam Constitutional:      Appearance: Normal appearance.  HENT:     Head: Normocephalic.     Mouth/Throat:     Mouth: Mucous membranes are moist.  Eyes:     Pupils: Pupils are equal, round, and reactive to light.  Cardiovascular:     Rate and Rhythm: Normal rate.     Pulses:          Femoral pulses are 2+ on the right side and 2+ on the left side.      Popliteal pulses are 0 on the right side and 0 on the left side.       Dorsalis pedis pulses are 0 on the right side and 0 on the left side.       Posterior tibial pulses are 0 on the right side and 0 on the left side.  Pulmonary:     Effort: Pulmonary effort is normal.  Abdominal:     General: Abdomen is flat.  Skin:    Capillary Refill: Capillary refill takes less than 2 seconds.  Neurological:     Mental Status: She is alert and oriented to person, place, and time.  Psychiatric:        Mood and Affect: Mood normal.        Thought Content: Thought  content normal.        Judgment: Judgment normal.     Data: IMPRESSION: 1. Small  right middle lobe pulmonary nodules up to 0.8 cm, nonspecific. Non-contrast chest CT at 3-6 months is recommended. If the nodules are stable at time of repeat CT, then future CT at 18-24 months (from today's scan) is considered optional for low-risk patients, but is recommended for high-risk patients. This recommendation follows the consensus statement: Guidelines for Management of Incidental Pulmonary Nodules Detected on CT Images: From the Fleischner Society 2017; Radiology 2017; 284:228-243. 2. Subcentimeter hepatic lesions likely cysts but incompletely characterized due to small size. Attention on follow-up imaging recommended. 3. 2.1 cm fusiform right common iliac artery aneurysm. Continued surveillance recommended. 4. Aortic Atherosclerosis (ICD10-I70.0) and Emphysema (ICD10-J43.9).   I have independently interpreted her ABIs to be 0.54 right and 0.69 left monophasic bilaterally with toe pressures on the right 77 and left 72     Assessment/Plan:     85 year old female with 2 cm right common iliac artery aneurysm that I do not think needs any further following given her age and relatively small size.  Also was sent here for evaluation of left great toe pain.  Probably has occluded SFAs but does not have any vascular appearing pain her toes do appear healthy.  Should be okay for podiatry intervention if ever necessary given her toe pressures of 77 on the right and 72 on the left.  If there are concerns about wounds in the future I would be happy to see her otherwise we will see her in 1 year with repeat ABIs.     Waynetta Sandy MD [facility]

## 2018-08-22 ENCOUNTER — Ambulatory Visit: Payer: Medicare Other | Admitting: Hematology

## 2018-08-22 ENCOUNTER — Other Ambulatory Visit: Payer: Medicare Other

## 2018-08-25 ENCOUNTER — Other Ambulatory Visit: Payer: Self-pay | Admitting: General Surgery

## 2018-08-25 DIAGNOSIS — C50912 Malignant neoplasm of unspecified site of left female breast: Secondary | ICD-10-CM | POA: Diagnosis not present

## 2018-08-25 DIAGNOSIS — I10 Essential (primary) hypertension: Secondary | ICD-10-CM | POA: Diagnosis not present

## 2018-08-25 DIAGNOSIS — G629 Polyneuropathy, unspecified: Secondary | ICD-10-CM | POA: Diagnosis not present

## 2018-08-25 DIAGNOSIS — Z923 Personal history of irradiation: Secondary | ICD-10-CM | POA: Diagnosis not present

## 2018-08-25 DIAGNOSIS — Z961 Presence of intraocular lens: Secondary | ICD-10-CM | POA: Diagnosis not present

## 2018-08-25 DIAGNOSIS — Z9221 Personal history of antineoplastic chemotherapy: Secondary | ICD-10-CM | POA: Diagnosis not present

## 2018-08-25 DIAGNOSIS — E119 Type 2 diabetes mellitus without complications: Secondary | ICD-10-CM | POA: Diagnosis not present

## 2018-08-25 DIAGNOSIS — H401131 Primary open-angle glaucoma, bilateral, mild stage: Secondary | ICD-10-CM | POA: Diagnosis not present

## 2018-08-25 DIAGNOSIS — I2510 Atherosclerotic heart disease of native coronary artery without angina pectoris: Secondary | ICD-10-CM | POA: Diagnosis not present

## 2018-09-09 ENCOUNTER — Ambulatory Visit: Payer: Medicare Other | Admitting: Hematology

## 2018-09-09 ENCOUNTER — Other Ambulatory Visit: Payer: Medicare Other

## 2018-09-09 ENCOUNTER — Other Ambulatory Visit: Payer: Self-pay | Admitting: Hematology

## 2018-09-14 NOTE — Progress Notes (Signed)
[facility]   [phone] [phone]   Clinic Follow up Note   Patient Care Team: Sasser, Silvestre Moment, MD as PCP - General (Cardiology) [name], MD as PCP - Cardiology (Cardiology) Truitt Merle, MD as Consulting Physician (Hematology) Excell Seltzer, MD as Consulting Physician (General Surgery) Alla Feeling, NP as Nurse Practitioner (Nurse Practitioner) [name], MD as Attending Physician (Cardiology)  Date of Service:  09/16/2018  CHIEF COMPLAINT: F/u on left breast cancer  SUMMARY OF ONCOLOGIC HISTORY: Oncology History Overview Note  Cancer Staging Carcinoma of upper-inner quadrant of left breast in female, estrogen receptor positive ([place]) Staging form: Breast, AJCC 8th Edition - Clinical stage from 01/18/2017: Stage IB (cT2, cN0, cM0, G3, ER: Positive, PR: Positive, HER2: Positive) - Signed by Truitt Merle, MD on 02/10/2017 - Pathologic stage from 02/23/2017: Stage IB (pT3, pN0, cM0, G3, ER: Positive, PR: Positive, HER2: Positive) - Signed by Alla Feeling, NP on 03/16/2017  Carcinoma of upper-outer quadrant of left breast in female, estrogen receptor positive ([place]) Staging form: Breast, AJCC 8th Edition - Clinical stage from 01/18/2017: Stage IB (cT2, cN0, cM0, G2, ER: Positive, PR: Positive, HER2: Negative) - Unsigned - Pathologic stage from 02/23/2017: Stage IB (pT2, pN0(sn), cM0, G3, ER: Positive, PR: Positive, HER2: Negative) - Signed by Alla Feeling, NP on 03/16/2017     Carcinoma of upper-outer quadrant of left breast in female, estrogen receptor positive ([place])  01/13/2017 Mammogram   FINDINGS: In the left breast, a spiculated mass lies in the upper outer quadrant. In the medial posterior left breast, there is a larger lobulated mass. These masses correspond to the palpable abnormalities. No other discrete left breast masses.  In the right breast, there are no discrete masses. There are no areas of  architectural distortion.  In both breasts there are scattered calcifications, rounded punctate, with a few other larger coarse dystrophic calcifications, without significant change from the previous screening mammogram, which is dated 12/04/2009.  Mammographic images were processed with CAD.  On physical exam, there is a firm palpable mass in the upper outer left breast and another firm mass in the medial left breast.    01/13/2017 Breast US   Targeted ultrasound is performed, showing an irregular hypoechoic shadowing mass in the left breast at the 2:30 o'clock position, 5 cm the nipple, measuring 3.3 x 2.8 x 2.6 cm. In the 10:30 o'clock position of the left breast, 5 cm the nipple, there is irregular hypoechoic mass with somewhat more lobulated margins, corresponding to the lobulated mass seen mammographically. This measures 4.8 x 2.9 x 4.8 cm. Both masses show internal vascularity on color Doppler analysis.  Sonographic evaluation of the left axilla shows several nodes with thickened cortices. Status cortex measured is 5 mm. None of these lymph nodes appear enlarged but overall size criteria.  IMPRESSION: 1. Two masses in the left breast, 1 at the 2:30 o'clock position in the other at the 10:30 o'clock position, both highly suspicious for breast carcinoma. 2. Several abnormal left axillary lymph nodes with thickened cortices. 3. No evidence of right breast malignancy.    01/18/2017 Pathology Results   Diagnosis 1. Breast, left, needle core biopsy, 2:30 o'clock - INVASIVE MAMMARY CARCINOMA - MAMMARY CARCINOMA IN-SITU - SEE COMMENT 2. Lymph node, needle/core biopsy, left axilla - NO CARCINOMA IDENTIFIED - SEE COMMENT 3. Breast, left, needle core biopsy, 10:30 o'clock - INVASIVE DUCTAL CARCINOMA - SEE COMMENT  1. PROGNOSTIC INDICATORS Results: IMMUNOHISTOCHEMICAL AND MORPHOMETRIC ANALYSIS  PERFORMED MANUALLY Estrogen Receptor: 100%, POSITIVE, STRONG STAINING  INTENSITY Progesterone Receptor: 60%, POSITIVE, STRONG STAINING INTENSITY Proliferation Marker Ki67: 60%  1. FLUORESCENCE IN-SITU HYBRIDIZATION Results: HER2 - NEGATIVE RATIO OF HER2/CEP17 SIGNALS 1.28 AVERAGE HER2 COPY NUMBER PER CELL 2.75  3. PROGNOSTIC INDICATORS Results: IMMUNOHISTOCHEMICAL AND MORPHOMETRIC ANALYSIS PERFORMED MANUALLY Estrogen Receptor: 100%, POSITIVE, STRONG STAINING INTENSITY Progesterone Receptor: 50%, POSITIVE, STRONG STAINING INTENSITY Proliferation Marker Ki67: 70%  3. FLUORESCENCE IN-SITU HYBRIDIZATION Results: HER2 - **POSITIVE** RATIO OF HER2/CEP17 SIGNALS 2.80 AVERAGE HER2 COPY NUMBER PER CELL 6.15  Microscopic Comment 1. The biopsy material shows an infiltrative proliferation of cells with large vesicular nuclei with inconspicuous nucleoli, arranged linearly and in small clusters. Based on the biopsy, the carcinoma appears Nottingham grade 2 of 3 and measures 0.9 cm in greatest linear extent. E-cadherin and prognostic markers (ER/PR/ki-67/HER2-FISH)are pending and will be reported in an addendum. Dr. Lyndon Code reviewed the case and agrees with the above diagnosis.   02/10/2017 Initial Diagnosis   Carcinoma of upper-outer quadrant of left breast in female, estrogen receptor positive ([place])   02/19/2017 Imaging   Bone Scan whole Body 02/19/17 IMPRESSION: Negative for evidence of bony metastatic disease.   02/19/2017 Imaging   CT CAP W Contrast 02/19/17 IMPRESSION: 1. Small right middle lobe pulmonary nodules up to 0.8 cm, nonspecific. Non-contrast chest CT at 3-6 months is recommended. If the nodules are stable at time of repeat CT, then future CT at 18-24 months (from today's scan) is considered optional for low-risk patients, but is recommended for high-risk patients. This recommendation follows the consensus statement: Guidelines for Management of Incidental Pulmonary Nodules Detected on CT Images: From the Fleischner Society 2017; Radiology 2017;  284:228-243. 2. Subcentimeter hepatic lesions likely cysts but incompletely characterized due to small size. Attention on follow-up imaging recommended. 3. 2.1 cm fusiform right common iliac artery aneurysm. Continued surveillance recommended. 4. Aortic Atherosclerosis (ICD10-I70.0) and Emphysema (ICD10-J43.9).   02/23/2017 Pathology Results   Diagnosis 1. Breast, simple mastectomy, Left Total - INVASIVE DUCTAL CARCINOMA, MULTIFOCAL, NOTTINGHAM GRADE 3/3 (5.3 CM, 3.5 CM) - INVASIVE CARCINOMA INVOLVES THE DERMIS - DUCTAL CARCINOMA IN SITU, INTERMEDIATE GRADE - HYALINIZED FIBROADENOMA - MARGINS UNINVOLVED BY CARCINOMA - NO CARCINOMA IDENTIFIED IN TWO LYMPH NODES (0/2) - SEE ONCOLOGY TABLE AND COMMENT BELOW 2. Lymph node, sentinel, biopsy, Left Axillary - NO CARCINOMA IDENTIFIED IN ONE LYMPH NODE (0/1) 3. Lymph node, sentinel, biopsy - NO CARCINOMA IDENTIFIED IN ONE LYMPH NODE (0/1) 4. Lymph node, sentinel, biopsy - NO CARCINOMA IDENTIFIED IN ONE LYMPH NODE (0/1) 5. Lymph node, sentinel, biopsy - NO CARCINOMA IDENTIFIED IN ONE LYMPH NODE (0/1) Microscopic Comment 1. BREAST, INVASIVE TUMOR Procedure: Simple mastectomy Laterality: Left Tumor Size: 5. 3 cm, 3.5 cm Histologic Type: Invasive carcinoma of no special type (ductal, not otherwise specified) Grade: Nottingham Grade 3 Tubular Differentiation: 3 Nuclear Pleomorphism: 3 Mitotic Count: 2 Ductal Carcinoma in Situ (DCIS): Present Extent of Tumor: Skin: Invasive carcinoma directly invades into the dermis or epidermis without skin ulceration Margins: Invasive carcinoma, distance from closest margin: 1.5 cm (posterior) DCIS, distance from closest margin: > 1 cm Regional Lymph Nodes: Number of Lymph Nodes Examined: 6 Number of Sentinel Lymph Nodes Examined: 4 Lymph Nodes with Macrometastases: 0 Lymph Nodes with Micrometastases: 0 Lymph Nodes with Isolated Tumor Cells: 0 Treatment effect: No known presurgical therapy Breast  Prognostic Profile: See Also ([id number]) Estrogen Receptor: Positive (100%, strong); Positive (100%, strong) Progesterone Receptor: Positive (50%, strong); Positive (50%, strong) Her2: Positive (Ratio 2.80); Negative (Ratio 1.28)  Ki-67: 70%; 60% Best tumor block for sendout testing: 1E Pathologic Stage Classification (pTNM, AJCC 8th Edition): Primary Tumor: mpT3 Regional Lymph Nodes: pN0 COMMENT: The two invasive carcinomas in the breast have slightly different morphologies. The larger lesion has a papillary and micropapillary pattern admixed with typical ductal carcinoma while the smaller lesion is more typical of a ductal lesion with linear arrays (E-cadherin performed on biopsy). There are foci within the larger lesion that are concerning for lymphovascular space invasion.    04/07/2017 - 04/27/2018 Chemotherapy   weekly taxol and herceptin x12 weeks, 04/07/17-06/23/17. Then herceptin q3 weeks for total 1 year which was switched to [place] on 07/14/17. Stopped after 2 doses due to worsening neuropathy. Switched back to maintenance Herceptin started 8/12019. Completed on 04/27/18.    07/20/2017 - 09/03/2017 Radiation Therapy   She started adjuvant radiation on 07/20/17 with Dr. Lisbeth Renshaw and plans to complete on 09/03/17.    10/2017 -  Anti-estrogen oral therapy   Anastrozole 39m daily starting 10/2017      CURRENT THERAPY:  Anastrozole 131mdaily starting 10/2017  INTERVAL HISTORY:  Penny PFEIFERs here for a follow up of left breast cancer. She presents to the clinic alone. She notes she is doing well. She also notes her neuropathy is still moderate. She has pain around 5/10. She is taking Lyrica for this. She is able to sleep. She has tramadol at home but not taking it for this. She notes inflammation recently that is effecting her forearm.  She notes occasional shooting pain in her left breast. Otherwise she has no new pain or complaints.  She is still taking anastrozole. She denies  any issues. She does note having constipation but not sure what is the cause. She notes her constipation is controlled.    REVIEW OF SYSTEMS:   Constitutional: Denies fevers, chills or abnormal weight loss Eyes: Denies blurriness of vision Ears, nose, mouth, throat, and face: Denies mucositis or sore throat Respiratory: Denies cough, dyspnea or wheezes Cardiovascular: Denies palpitation, chest discomfort or lower extremity swelling Gastrointestinal:  Denies nausea, heartburn (+) constipation, controlled  Skin: Denies abnormal skin rashes Lymphatics: Denies new lymphadenopathy or easy bruising Neurological: (+) neuropathy in hands and feet Behavioral/Psych: Mood is stable, no new changes  All other systems were reviewed with the patient and are negative.  MEDICAL HISTORY:  Past Medical History:  Diagnosis Date   Arthritis    "maybe a little in my left knee" (02/23/2017)   Asthmatic bronchitis    [place]   Coronary atherosclerosis    Minor nonobstructive CAD at cardiac catheterization 2009   Dyslipidemia    Femoral bruit 7/08   With normal ABIs   Hypertension    Iliac artery aneurysm ([facility])    Lower extremity edema    Chronic   Pneumonia ~ 1950   in 8th grade   [place] II diabetes mellitus [place]    SURGICAL HISTORY: Past Surgical History:  Procedure Laterality Date   APPENDECTOMY     BREAST BIOPSY Left 02/2017   CARDIAC CATHETERIZATION     "long time ago" (02/23/2017)   MASTECTOMY COMPLETE / SIMPLE W/ SENTINEL NODE BIOPSY Left 02/23/2017   TOTAL MATECTOMY;  LEFT AXILLARY SENTINEL LYMPH NODE BIOPSY ERAS PATHWAY   MASTECTOMY WITH RADIOACTIVE SEED GUIDED EXCISION AND AXILLARY SENTINEL LYMPH NODE BIOPSY Left 02/23/2017   Procedure: LEFT TOTAL MATECTOMY;  LEFT AXILLARY [place] Surgeon: HoExcell SeltzerMD;  Location: [facility];  Service: General;  Laterality: Left;   PILONIDAL CYST EXCISION     PORTA CATH  INSERTION  02/23/2017   PORTACATH PLACEMENT N/A 02/23/2017   Procedure: INSERTION PORT-A-CATH;  Surgeon: Excell Seltzer, MD;  Location: [facility];  Service: General;  Laterality: N/A;   SHOULDER ARTHROSCOPY W/ ROTATOR CUFF REPAIR Right     I have reviewed the social history and family history with the patient and they are unchanged from previous note.  ALLERGIES:  has No Known Allergies.  MEDICATIONS:  Current Outpatient Medications  Medication Sig Dispense Refill   amLODipine (NORVASC) 5 MG tablet TAKE 1 TABLET BY MOUTH ONCE DAILY 90 tablet 0   anastrozole (ARIMIDEX) 1 MG tablet TAKE 1 TABLET BY MOUTH EVERY DAY 90 tablet 1   aspirin 81 MG EC tablet Take 81 mg by mouth at bedtime.      chlorthalidone (HYGROTON) 25 MG tablet Take 25 mg by mouth daily.      Coenzyme Q10 (COQ10) 100 MG CAPS Take 100 mg by mouth daily.       fluorometholone (FML) 0.1 % ophthalmic suspension Place 1 drop into both eyes every 4 (four) hours.     latanoprost (XALATAN) 0.005 % ophthalmic solution Place 1 drop into both eyes at bedtime.     lidocaine-prilocaine (EMLA) cream APPLY CREAM TOPICALLY TO AFFECTED AREA ONCE  3   losartan (COZAAR) 50 MG tablet Take 1 tablet (50 mg total) by mouth 2 (two) times daily. 180 tablet 3   metFORMIN (GLUCOPHAGE) 500 MG tablet Take 1,000 mg by mouth daily with breakfast.      pregabalin (LYRICA) 75 MG capsule TAKE 1 CAPSULE BY MOUTH TWICE A DAY FOR 7 DAYS THEN 2 TWICE A DAY 120 capsule 1   traMADol (ULTRAM) 50 MG tablet Take 1 tablet (50 mg total) by mouth every 6 (six) hours as needed for moderate pain (neuropathic pain). 60 tablet 0   vitamin B-12 (CYANOCOBALAMIN) 1000 MCG tablet Take 1,000 mcg by mouth daily.     No current facility-administered medications for this visit.     PHYSICAL EXAMINATION: ECOG PERFORMANCE STATUS: 1 - Symptomatic but completely ambulatory  Vitals:   09/16/18 1341  BP: (!) 159/75  Pulse: 68  Resp: 20  Temp: 98.2 F (36.8 C)    SpO2: 98%   Filed Weights   09/16/18 1341  Weight: 179 lb 6.4 oz (81.4 kg)    GENERAL:alert, no distress and comfortable SKIN: skin color, texture, turgor are normal, no rashes or significant lesions EYES: normal, Conjunctiva are pink and non-injected, sclera clear  NECK: supple, thyroid normal size, non-tender, without nodularity LYMPH:  no palpable lymphadenopathy in the cervical, axillary  LUNGS: clear to auscultation and percussion with normal breathing effort HEART: regular rate & rhythm and no murmurs and no lower extremity edema ABDOMEN:abdomen soft, non-tender and normal bowel sounds Musculoskeletal:no cyanosis of digits and no clubbing  NEURO: alert & oriented x 3 with fluent speech, no focal motor/sensory deficits BREAST: S/p left mastectomy: Surgical incision healed well with scar tissue. (+) Mild skin hyperpigmentation of left breast from RT. No palpable mass, nodules or adenopathy bilaterally. Breast exam benign.   LABORATORY DATA:  I have reviewed the data as listed CBC Latest Ref Rng & Units 09/16/2018 04/21/2018 04/05/2018  WBC 4.0 - 10.5 K/uL 4.1 3.6(L) 3.7(L)  Hemoglobin 12.0 - 15.0 g/dL 12.6 11.4(L) 11.9(L)  Hematocrit 36.0 - 46.0 % 38.9 36.0 36.8  Platelets 150 - 400 K/uL 228 227  236     CMP Latest Ref Rng & Units 09/16/2018 04/21/2018 04/05/2018  Glucose 70 - 99 mg/dL 85 170(H) 71  BUN 8 - 23 mg/dL 15 15 13   Creatinine 0.44 - 1.00 mg/dL 0.83 0.81 0.78  Sodium 135 - 145 mmol/L 133(L) 133(L) 133(L)  Potassium 3.5 - 5.1 mmol/L 3.9 4.2 3.7  Chloride 98 - 111 mmol/L 97(L) 98 97(L)  CO2 22 - 32 mmol/L 27 26 27   Calcium 8.9 - 10.3 mg/dL 9.7 9.1 9.5  Total Protein 6.5 - 8.1 g/dL 7.1 6.9 7.1  Total Bilirubin 0.3 - 1.2 mg/dL 0.8 0.6 1.0  Alkaline Phos 38 - 126 U/L 71 79 72  AST 15 - 41 U/L 19 20 20   ALT 0 - 44 U/L 14 19 15       RADIOGRAPHIC STUDIES: I have personally reviewed the radiological images as listed and agreed with the findings in the report. No results  found.   ASSESSMENT & PLAN:  NAVY ROTHSCHILD is a 85 y.o. female with   1.Multifocal invasive ductal carcinoma of the left breast of female;carcinoma of the upper-outer quadrant of left breast and female, ER/PR positive, HER-2 negative pT2N0M0, G2, Stage 1B; IDC of the upper-inner quadrant left breast, ER+/PR+/HER2+, pT3N0M0 G3, stage IB -Diagnosed in 2018. Treated withleft mastectomy first, followed by adjuvantchemo and adjuvant radiation. She developed a severe peripheral neuropathy from adjuvant Taxol and stopped earlier. She did try TDM1,and stopped due to worsening neuropathy. She completed her 1 year treatment with maintenanceHerceptin.  -She started antiestrogen therapy with anastrozole in 10/2017. Tolerating well overall. Will continue for 5 years -She has stable neuropathy, manageable at this time. She also notes constipation but this is controlled.  -She is clinically doing well. Lab reviewed, her CBC and CMP are within normal limits. Her physical exam and her 02/2018 mammogram were unremarkable. There is no clinical concern for recurrence. -Continue anastrozole. Given her Osteoporosis, if she does not proceed with bisphosphonate I would recommend switching to Tamoxifen. I reviewed Tamoxifen side effects of blood clots and increased risk of endometrial risk. She will likely proceed with oral Fosamax after discussing with her PCP.  -Continue surveillance. Next mammogram in 02/2019 -She still has PAC in place. She plans to have this removed on 09/27/18.  -F/u in 6 months    2. Peripheral neuropathy, G2, secondary to taxol, DM and T-DM1 -Follow-up with Dr. Mickeal Skinner -Multiple neuropathic pain drugs have not improved her sx, including Gabapentin, Amytryptilline, Pregabalin, and Lidocaine  -She required a walker for ambulation, uses a crane now.  -She previously did PT   -The numbness in her fingers and tingling in her feet are still moderate, but manageable so far. Has not  improved -She has Tramadol but does not use it now. She will continue Lyrica which controls her pain.   3. HTN, CAD, DM -f/u with PCP  4. Osteoporosis -I encouraged her to continue calcium and vitamin D supplement -07/2018 DEXA shows osteoporosis with lowest T-score -2.6 at left forearm radius.  -We discussed anastrozole can weaken her bone further.  -I discussed the options of starting oral or IV bisphosphonate to strengthen her bone or switching anastrozole to Tamoxifen. I recommend oral weekly Fosamax. I reviewed main side effects of jaw necrosis and bone pain. I printed out information on these drugs, she will discuss this with her PCP and likely will start.  -For now I recommended she start oral calcium and Vitamin D. I also encouraged her to avoid falls given  her risk for fracture.    Plan -I recommend her to consider Fosamax, she will discuss with her PCP  -I recommended she start calcium and Vit D -Continue anastrozole -Mammogram in 02/2019 -Lab and f/u in 6 months    No problem-specific Assessment & Plan notes found for this encounter.   No orders of the defined types were placed in this encounter.  All questions were answered. The patient knows to call the clinic with any problems, questions or concerns. No barriers to learning was detected. I spent 20 minutes counseling the patient face to face. The total time spent in the appointment was 25 minutes and more than 50% was on counseling and review of test results     Truitt Merle, MD 09/16/2018   I, Joslyn Devon, am acting as scribe for Truitt Merle, MD.   I have reviewed the above documentation for accuracy and completeness, and I agree with the above.

## 2018-09-16 ENCOUNTER — Telehealth: Payer: Self-pay | Admitting: Hematology

## 2018-09-16 ENCOUNTER — Inpatient Hospital Stay: Payer: Medicare Other | Attending: Hematology

## 2018-09-16 ENCOUNTER — Encounter: Payer: Self-pay | Admitting: Hematology

## 2018-09-16 ENCOUNTER — Other Ambulatory Visit: Payer: Self-pay

## 2018-09-16 ENCOUNTER — Inpatient Hospital Stay (HOSPITAL_BASED_OUTPATIENT_CLINIC_OR_DEPARTMENT_OTHER): Payer: Medicare Other | Admitting: Hematology

## 2018-09-16 VITALS — BP 159/75 | HR 68 | Temp 98.2°F | Resp 20 | Ht 63.0 in | Wt 179.4 lb

## 2018-09-16 DIAGNOSIS — M810 Age-related osteoporosis without current pathological fracture: Secondary | ICD-10-CM | POA: Insufficient documentation

## 2018-09-16 DIAGNOSIS — C50212 Malignant neoplasm of upper-inner quadrant of left female breast: Secondary | ICD-10-CM | POA: Diagnosis not present

## 2018-09-16 DIAGNOSIS — C50412 Malignant neoplasm of upper-outer quadrant of left female breast: Secondary | ICD-10-CM | POA: Insufficient documentation

## 2018-09-16 DIAGNOSIS — K5900 Constipation, unspecified: Secondary | ICD-10-CM | POA: Insufficient documentation

## 2018-09-16 DIAGNOSIS — Z170 Estrogen receptor positive status [ER+]: Secondary | ICD-10-CM

## 2018-09-16 DIAGNOSIS — G620 Drug-induced polyneuropathy: Secondary | ICD-10-CM | POA: Insufficient documentation

## 2018-09-16 DIAGNOSIS — E1042 Type 1 diabetes mellitus with diabetic polyneuropathy: Secondary | ICD-10-CM | POA: Insufficient documentation

## 2018-09-16 DIAGNOSIS — I10 Essential (primary) hypertension: Secondary | ICD-10-CM | POA: Diagnosis not present

## 2018-09-16 DIAGNOSIS — Z79811 Long term (current) use of aromatase inhibitors: Secondary | ICD-10-CM | POA: Diagnosis not present

## 2018-09-16 LAB — CBC WITH DIFFERENTIAL (CANCER CENTER ONLY)
Abs Immature Granulocytes: 0 10*3/uL (ref 0.00–0.07)
Basophils Absolute: 0 10*3/uL (ref 0.0–0.1)
Basophils Relative: 1 %
Eosinophils Absolute: 0.1 10*3/uL (ref 0.0–0.5)
Eosinophils Relative: 2 %
HCT: 38.9 % (ref 36.0–46.0)
Hemoglobin: 12.6 g/dL (ref 12.0–15.0)
Immature Granulocytes: 0 %
Lymphocytes Relative: 38 %
Lymphs Abs: 1.6 10*3/uL (ref 0.7–4.0)
MCH: 29.6 pg (ref 26.0–34.0)
MCHC: 32.4 g/dL (ref 30.0–36.0)
MCV: 91.3 fL (ref 80.0–100.0)
Monocytes Absolute: 0.5 10*3/uL (ref 0.1–1.0)
Monocytes Relative: 13 %
Neutro Abs: 1.9 10*3/uL (ref 1.7–7.7)
Neutrophils Relative %: 46 %
Platelet Count: 228 10*3/uL (ref 150–400)
RBC: 4.26 MIL/uL (ref 3.87–5.11)
RDW: 13.1 % (ref 11.5–15.5)
WBC Count: 4.1 10*3/uL (ref 4.0–10.5)
nRBC: 0 % (ref 0.0–0.2)

## 2018-09-16 LAB — CMP (CANCER CENTER ONLY)
ALT: 14 U/L (ref 0–44)
AST: 19 U/L (ref 15–41)
Albumin: 3.9 g/dL (ref 3.5–5.0)
Alkaline Phosphatase: 71 U/L (ref 38–126)
Anion gap: 9 (ref 5–15)
BUN: 15 mg/dL (ref 8–23)
CO2: 27 mmol/L (ref 22–32)
Calcium: 9.7 mg/dL (ref 8.9–10.3)
Chloride: 97 mmol/L — ABNORMAL LOW (ref 98–111)
Creatinine: 0.83 mg/dL (ref 0.44–1.00)
GFR, Est AFR Am: >60 mL/min (ref >60)
GFR, Estimated: >60 mL/min (ref >60)
Glucose, Bld: 85 mg/dL (ref 70–99)
Potassium: 3.9 mmol/L (ref 3.5–5.1)
Sodium: 133 mmol/L — ABNORMAL LOW (ref 135–145)
Total Bilirubin: 0.8 mg/dL (ref 0.3–1.2)
Total Protein: 7.1 g/dL (ref 6.5–8.1)

## 2018-09-16 NOTE — Telephone Encounter (Signed)
Scheduled appt per 8/14 los.  Was not able to leave a voice message.  Will print and mail out a calendar the week of aug 24 when I am in the office.

## 2018-09-17 ENCOUNTER — Encounter: Payer: Self-pay | Admitting: Hematology

## 2018-09-20 ENCOUNTER — Encounter (HOSPITAL_BASED_OUTPATIENT_CLINIC_OR_DEPARTMENT_OTHER): Payer: Self-pay | Admitting: *Deleted

## 2018-09-20 ENCOUNTER — Other Ambulatory Visit: Payer: Self-pay

## 2018-09-21 DIAGNOSIS — I739 Peripheral vascular disease, unspecified: Secondary | ICD-10-CM | POA: Diagnosis not present

## 2018-09-21 DIAGNOSIS — M79672 Pain in left foot: Secondary | ICD-10-CM | POA: Diagnosis not present

## 2018-09-21 DIAGNOSIS — M79671 Pain in right foot: Secondary | ICD-10-CM | POA: Diagnosis not present

## 2018-09-23 ENCOUNTER — Other Ambulatory Visit (HOSPITAL_COMMUNITY)
Admission: RE | Admit: 2018-09-23 | Discharge: 2018-09-23 | Disposition: A | Discharge status: Home / Self Care | Payer: Medicare Other | Source: Ambulatory Visit | Attending: General Surgery | Admitting: General Surgery

## 2018-09-23 DIAGNOSIS — Z20828 Contact with and (suspected) exposure to other viral communicable diseases: Secondary | ICD-10-CM | POA: Diagnosis not present

## 2018-09-23 DIAGNOSIS — Z01812 Encounter for preprocedural laboratory examination: Secondary | ICD-10-CM | POA: Insufficient documentation

## 2018-09-23 LAB — SARS CORONAVIRUS 2 (TAT 6-24 HRS): SARS Coronavirus 2: NEGATIVE

## 2018-09-23 NOTE — Progress Notes (Signed)

## 2018-09-25 NOTE — H&P (Signed)
Penny Huynh Location: [facility] Patient #: [id number] DOB: 01/26/1934 Married / Language: English / Race: Black or African American Female      History of Present Illness       This is an 85 year old patient of Dr. Adonis Huynh. Dr. Burr Huynh is her oncologist. Penny Huynh is her PCP. She is followed by [facility] cardiology.      On February 23, 2017 she underwent left total mastectomy and sentinel lymph node biopsy. She had 2 cancers, 3.3 cm 4.8 cm. Receptor positive. One of these was HER-2 positive and one was HER-2 negative. All 4 sentinel nodes negative. Port-A-Cath inserted in the right internal jugular vein. She has completed radiation therapy and chemotherapy. She requests Port-A-Cath removal. Last seen by Dr. Excell Huynh on August 20, 2017. She is followed closely by Dr. Burr Huynh. She takes anastrozole      Past history reveals she takes aspirin prophylactically. Hypertension. Coronary artery disease but never had an MI. Non-insulin-dependent diabetes. Has neuropathy secondary to diabetes and TDM 1. She is married. 2 foster children. Former smoker.       Exam today was negative for any evidence of recurrent cancer She will be scheduled for removal of Port-A-Cath under monitored sedation I discussed the indications, details, techniques, and numerous risk of surgery with her. She is aware of the risk of bleeding, infection, difficulty removal requiring neck incision is well. She understands all these issues. All of her questions are answered. She agrees with this plan. Because of age and cardiac history this will need to be done at [facility] or [facility].   Allergies  No Known Drug Allergies   Allergies Reconciled   Medication History  AmLODIPine Besylate (5MG Tablet, Oral) Active. Illusions C Breast Prosthesis (1 (one)) Active. (K0881 Silicone Breast Prosthesis - to restore balance and symmetry after breast surgery; QTY: 1 for single; Length of use: 2 years No  refills; replacement: healed now ready for silicone; J0315 Post-surgical bras - to hold breast prosthesis ; QTY: 6; Length of use:3 Months 3 Refills) MetFORMIN HCl (500MG Tablet, Oral) Active. Chlorthalidone (25MG Tablet, Oral) Active. Aspirin (81MG Tablet DR, Oral) Active. Vitamin B-12 (1000MCG Tablet, Oral) Active. Losartan Potassium (50MG Tablet, Oral) Active. Coenzyme Q-10 (100MG Capsule, Oral) Active. Medications Reconciled  Vitals  Weight: 179.8 lb Height: 63in Body Surface Area: 1.85 m Body Mass Index: 31.85 kg/m  Temp.: 98.62F  Pulse: 94 (Regular)  BP: 144/82(Sitting, Left Arm, Standard)    Physical Exam  General Mental Status-Alert. General Appearance-Not in acute distress. Build & Nutrition-Well nourished. Posture-Normal posture. Gait-Normal. Note: Alert. Excellent insight. Performance status is excellent for her age. Her hair has completely grown back and is healthy   Head and Neck Head-normocephalic, atraumatic with no lesions or palpable masses. Trachea-midline. Thyroid Gland Characteristics - normal size and consistency and no palpable nodules.  Chest and Lung Exam Chest and lung exam reveals -on auscultation, normal breath sounds, no adventitious sounds and normal vocal resonance. Note: Port in right infraclavicular area. Well-healed. Catheter appears to go up into the right internal jugular vein.   Breast Note: Left mastectomy incision healed. Lots of radiation changes. No seroma. No signs of chest wall recurrence. Excellent range of motion left shoulder. Trace lymphedema. No axillary mass. Right breast relatively large. Skin healthy. No mass or adenopathy.   Cardiovascular Cardiovascular examination reveals -normal heart sounds, regular rate and rhythm with no murmurs and femoral artery auscultation bilaterally reveals normal pulses, no bruits, no thrills.  Abdomen Inspection Inspection  of the abdomen reveals -  No Hernias. Palpation/Percussion Palpation and Percussion of the abdomen reveal - Soft, Non Tender, No Rigidity (guarding), No hepatosplenomegaly and No Palpable abdominal masses.  Neurologic Neurologic evaluation reveals -alert and oriented x 3 with no impairment of recent or remote memory, normal attention span and ability to concentrate, normal sensation and normal coordination.  Musculoskeletal Normal Exam - Bilateral-Upper Extremity Strength Normal and Lower Extremity Strength Normal.    Assessment & Plan   STAGE II BREAST CANCER, LEFT (C50.912)  Examination of her left mastectomy wound, right breast and all the regional lymph nodes today is normal There is no evidence of cancer  You have completed radiation therapy and chemotherapy you request removal of the Port-A-Cath and that is reasonable We will check with Dr. Burr Huynh  you will be scheduled for removal of Port-A-Cath under monitored sedation in the near future at your convenience We have discussed the indications, techniques, and risk of the surgery in detail Your husband will need to drive you home   HISTORY OF CHEMOTHERAPY (Z92.21)  HISTORY OF RADIATION THERAPY (Z92.3)  CORONARY ARTERY DISEASE (I25.10)  NEUROPATHY (G62.9)  TYPE 2 DIABETES MELLITUS TREATED WITHOUT INSULIN (E11.9)  HYPERTENSION, ESSENTIAL, BENIGN (I10)    Penny Huynh M. Penny Huynh, M.D., [facility] General and Minimally invasive Surgery Breast and Colorectal Surgery Office:   [phone] Pager:   [phone]

## 2018-09-26 ENCOUNTER — Other Ambulatory Visit: Payer: Self-pay | Admitting: Internal Medicine

## 2018-09-27 ENCOUNTER — Other Ambulatory Visit: Payer: Self-pay

## 2018-09-27 ENCOUNTER — Ambulatory Visit (HOSPITAL_BASED_OUTPATIENT_CLINIC_OR_DEPARTMENT_OTHER)
Admission: RE | Admit: 2018-09-27 | Discharge: 2018-09-27 | Disposition: A | Discharge status: Home / Self Care | Payer: Medicare Other | Attending: General Surgery | Admitting: General Surgery

## 2018-09-27 ENCOUNTER — Ambulatory Visit (HOSPITAL_BASED_OUTPATIENT_CLINIC_OR_DEPARTMENT_OTHER): Payer: Medicare Other | Admitting: Certified Registered"

## 2018-09-27 ENCOUNTER — Encounter (HOSPITAL_BASED_OUTPATIENT_CLINIC_OR_DEPARTMENT_OTHER): Payer: Self-pay

## 2018-09-27 ENCOUNTER — Encounter (HOSPITAL_BASED_OUTPATIENT_CLINIC_OR_DEPARTMENT_OTHER)
Admission: RE | Disposition: A | Discharge status: Home / Self Care | Payer: Self-pay | Source: Home / Self Care | Attending: General Surgery

## 2018-09-27 DIAGNOSIS — Z9221 Personal history of antineoplastic chemotherapy: Secondary | ICD-10-CM | POA: Insufficient documentation

## 2018-09-27 DIAGNOSIS — Z7982 Long term (current) use of aspirin: Secondary | ICD-10-CM | POA: Insufficient documentation

## 2018-09-27 DIAGNOSIS — C50412 Malignant neoplasm of upper-outer quadrant of left female breast: Secondary | ICD-10-CM

## 2018-09-27 DIAGNOSIS — Z79899 Other long term (current) drug therapy: Secondary | ICD-10-CM | POA: Diagnosis not present

## 2018-09-27 DIAGNOSIS — I10 Essential (primary) hypertension: Secondary | ICD-10-CM | POA: Diagnosis not present

## 2018-09-27 DIAGNOSIS — E1140 Type 2 diabetes mellitus with diabetic neuropathy, unspecified: Secondary | ICD-10-CM | POA: Insufficient documentation

## 2018-09-27 DIAGNOSIS — Z79818 Long term (current) use of other agents affecting estrogen receptors and estrogen levels: Secondary | ICD-10-CM | POA: Diagnosis not present

## 2018-09-27 DIAGNOSIS — C50912 Malignant neoplasm of unspecified site of left female breast: Secondary | ICD-10-CM | POA: Insufficient documentation

## 2018-09-27 DIAGNOSIS — Z170 Estrogen receptor positive status [ER+]: Secondary | ICD-10-CM | POA: Diagnosis not present

## 2018-09-27 DIAGNOSIS — Z87891 Personal history of nicotine dependence: Secondary | ICD-10-CM | POA: Diagnosis not present

## 2018-09-27 DIAGNOSIS — I2510 Atherosclerotic heart disease of native coronary artery without angina pectoris: Secondary | ICD-10-CM | POA: Diagnosis not present

## 2018-09-27 DIAGNOSIS — Z7984 Long term (current) use of oral hypoglycemic drugs: Secondary | ICD-10-CM | POA: Diagnosis not present

## 2018-09-27 DIAGNOSIS — C50212 Malignant neoplasm of upper-inner quadrant of left female breast: Secondary | ICD-10-CM | POA: Diagnosis not present

## 2018-09-27 DIAGNOSIS — Z9012 Acquired absence of left breast and nipple: Secondary | ICD-10-CM | POA: Insufficient documentation

## 2018-09-27 DIAGNOSIS — G620 Drug-induced polyneuropathy: Secondary | ICD-10-CM | POA: Diagnosis present

## 2018-09-27 DIAGNOSIS — Z923 Personal history of irradiation: Secondary | ICD-10-CM | POA: Diagnosis not present

## 2018-09-27 DIAGNOSIS — Z95828 Presence of other vascular implants and grafts: Secondary | ICD-10-CM

## 2018-09-27 DIAGNOSIS — Z79811 Long term (current) use of aromatase inhibitors: Secondary | ICD-10-CM | POA: Insufficient documentation

## 2018-09-27 DIAGNOSIS — Z452 Encounter for adjustment and management of vascular access device: Secondary | ICD-10-CM | POA: Insufficient documentation

## 2018-09-27 HISTORY — PX: PORT-A-CATH REMOVAL: SHX5289

## 2018-09-27 LAB — GLUCOSE, CAPILLARY
Glucose-Capillary: 157 mg/dL — ABNORMAL HIGH (ref 70–99)
Glucose-Capillary: 161 mg/dL — ABNORMAL HIGH (ref 70–99)

## 2018-09-27 SURGERY — REMOVAL PORT-A-CATH
Anesthesia: Monitor Anesthesia Care | Site: Chest | Laterality: Right

## 2018-09-27 MED ORDER — SODIUM CHLORIDE 0.9% FLUSH: 3.0000 mL | Freq: Two times a day (BID) | INTRAVENOUS | Status: DC

## 2018-09-27 MED ORDER — METOCLOPRAMIDE HCL 5 MG/ML IJ SOLN: 10.0000 mg | Freq: Once | INTRAMUSCULAR | Status: DC | PRN

## 2018-09-27 MED ORDER — ACETAMINOPHEN 500 MG PO TABS
ORAL_TABLET | ORAL | Status: AC
  Filled 2018-09-27: qty 2

## 2018-09-27 MED ORDER — SODIUM BICARBONATE 4 % IV SOLN
INTRAVENOUS | Status: DC | PRN
  Administered 2018-09-27: 1 mL via INTRAVENOUS

## 2018-09-27 MED ORDER — MEPERIDINE HCL 25 MG/ML IJ SOLN: 6.2500 mg | INTRAMUSCULAR | Status: DC | PRN

## 2018-09-27 MED ORDER — PROPOFOL 500 MG/50ML IV EMUL
INTRAVENOUS | Status: DC | PRN
  Administered 2018-09-27: 80 ug/kg/min via INTRAVENOUS

## 2018-09-27 MED ORDER — GABAPENTIN 300 MG PO CAPS
300.0000 mg | ORAL_CAPSULE | ORAL | Status: AC
  Administered 2018-09-27: 08:00:00 300 mg via ORAL

## 2018-09-27 MED ORDER — PHENYLEPHRINE HCL (PRESSORS) 10 MG/ML IV SOLN
INTRAVENOUS | Status: DC | PRN
  Administered 2018-09-27: 100 ug via INTRAVENOUS

## 2018-09-27 MED ORDER — LACTATED RINGERS IV SOLN
INTRAVENOUS | Status: DC
  Administered 2018-09-27: 08:00:00 via INTRAVENOUS

## 2018-09-27 MED ORDER — PHENYLEPHRINE 40 MCG/ML (10ML) SYRINGE FOR IV PUSH (FOR BLOOD PRESSURE SUPPORT)
PREFILLED_SYRINGE | INTRAVENOUS | Status: AC
  Filled 2018-09-27: qty 10

## 2018-09-27 MED ORDER — ACETAMINOPHEN 500 MG PO TABS
1000.0000 mg | ORAL_TABLET | ORAL | Status: AC
  Administered 2018-09-27: 08:00:00 1000 mg via ORAL

## 2018-09-27 MED ORDER — LIDOCAINE 1 % OPTIME INJ - NO CHARGE
INTRAMUSCULAR | Status: DC | PRN
  Administered 2018-09-27: 10:00:00 9 mL

## 2018-09-27 MED ORDER — CHLORHEXIDINE GLUCONATE CLOTH 2 % EX PADS: 6.0000 | MEDICATED_PAD | Freq: Once | CUTANEOUS | Status: DC

## 2018-09-27 MED ORDER — CEFAZOLIN SODIUM-DEXTROSE 2-4 GM/100ML-% IV SOLN
2.0000 g | INTRAVENOUS | Status: AC
  Administered 2018-09-27: 2 g via INTRAVENOUS

## 2018-09-27 MED ORDER — FENTANYL CITRATE (PF) 100 MCG/2ML IJ SOLN
INTRAMUSCULAR | Status: AC
  Filled 2018-09-27: qty 2

## 2018-09-27 MED ORDER — FENTANYL CITRATE (PF) 100 MCG/2ML IJ SOLN
50.0000 ug | INTRAMUSCULAR | Status: DC | PRN
  Administered 2018-09-27 (×2): 25 ug via INTRAVENOUS

## 2018-09-27 MED ORDER — MIDAZOLAM HCL 2 MG/2ML IJ SOLN: 1.0000 mg | INTRAMUSCULAR | Status: DC | PRN

## 2018-09-27 MED ORDER — CEFAZOLIN SODIUM-DEXTROSE 2-4 GM/100ML-% IV SOLN
INTRAVENOUS | Status: AC
  Filled 2018-09-27: qty 100

## 2018-09-27 MED ORDER — HYDROCODONE-ACETAMINOPHEN 5-325 MG PO TABS: 1.0000 | ORAL_TABLET | Freq: Four times a day (QID) | ORAL | 0 refills | Status: DC | PRN

## 2018-09-27 MED ORDER — ONDANSETRON HCL 4 MG/2ML IJ SOLN
INTRAMUSCULAR | Status: AC
  Filled 2018-09-27: qty 2

## 2018-09-27 MED ORDER — LIDOCAINE 2% (20 MG/ML) 5 ML SYRINGE
INTRAMUSCULAR | Status: AC
  Filled 2018-09-27: qty 5

## 2018-09-27 MED ORDER — ONDANSETRON HCL 4 MG/2ML IJ SOLN
INTRAMUSCULAR | Status: DC | PRN
  Administered 2018-09-27: 4 mg via INTRAVENOUS

## 2018-09-27 MED ORDER — GABAPENTIN 300 MG PO CAPS
ORAL_CAPSULE | ORAL | Status: AC
  Filled 2018-09-27: qty 1

## 2018-09-27 MED ORDER — FENTANYL CITRATE (PF) 100 MCG/2ML IJ SOLN: 25.0000 ug | INTRAMUSCULAR | Status: DC | PRN

## 2018-09-27 MED ORDER — HYDRALAZINE HCL 20 MG/ML IJ SOLN: 10.0000 mg | INTRAMUSCULAR | Status: DC | PRN

## 2018-09-27 MED ORDER — SCOPOLAMINE 1 MG/3DAYS TD PT72: 1.0000 | MEDICATED_PATCH | Freq: Once | TRANSDERMAL | Status: DC

## 2018-09-27 SURGICAL SUPPLY — 36 items
BENZOIN TINCTURE PRP APPL 2/3 (GAUZE/BANDAGES/DRESSINGS) IMPLANT
BLADE HEX COATED 2.75 (ELECTRODE) ×3 IMPLANT
BLADE SURG 15 STRL LF DISP TIS (BLADE) ×1 IMPLANT
BLADE SURG 15 STRL SS (BLADE) ×2
CHLORAPREP W/TINT 26 (MISCELLANEOUS) ×3 IMPLANT
CLOSURE WOUND 1/2 X4 (GAUZE/BANDAGES/DRESSINGS)
COVER BACK TABLE REUSABLE LG (DRAPES) ×3 IMPLANT
COVER MAYO STAND REUSABLE (DRAPES) ×3 IMPLANT
COVER WAND RF STERILE (DRAPES) IMPLANT
DECANTER SPIKE VIAL GLASS SM (MISCELLANEOUS) ×3 IMPLANT
DERMABOND ADVANCED (GAUZE/BANDAGES/DRESSINGS) ×2
DERMABOND ADVANCED .7 DNX12 (GAUZE/BANDAGES/DRESSINGS) ×1 IMPLANT
DRAPE LAPAROTOMY 100X72 PEDS (DRAPES) ×3 IMPLANT
DRAPE UTILITY XL STRL (DRAPES) ×3 IMPLANT
DRSG TEGADERM 4X4.75 (GAUZE/BANDAGES/DRESSINGS) IMPLANT
ELECT REM PT RETURN 9FT ADLT (ELECTROSURGICAL) ×3
ELECTRODE REM PT RTRN 9FT ADLT (ELECTROSURGICAL) ×1 IMPLANT
GAUZE SPONGE 4X4 12PLY STRL LF (GAUZE/BANDAGES/DRESSINGS) IMPLANT
GLOVE SS BIOGEL STRL SZ 7 (GLOVE) ×1 IMPLANT
GLOVE SUPERSENSE BIOGEL SZ 7 (GLOVE) ×2
GOWN STRL REUS W/ TWL LRG LVL3 (GOWN DISPOSABLE) ×1 IMPLANT
GOWN STRL REUS W/ TWL XL LVL3 (GOWN DISPOSABLE) ×1 IMPLANT
GOWN STRL REUS W/TWL LRG LVL3 (GOWN DISPOSABLE) ×2
GOWN STRL REUS W/TWL XL LVL3 (GOWN DISPOSABLE) ×2
NEEDLE HYPO 25X1 1.5 SAFETY (NEEDLE) ×3 IMPLANT
PACK BASIN DAY SURGERY FS (CUSTOM PROCEDURE TRAY) ×3 IMPLANT
PENCIL BUTTON HOLSTER BLD 10FT (ELECTRODE) ×3 IMPLANT
SLEEVE SCD COMPRESS KNEE MED (MISCELLANEOUS) ×3 IMPLANT
STRIP CLOSURE SKIN 1/2X4 (GAUZE/BANDAGES/DRESSINGS) IMPLANT
SUT MNCRL AB 4-0 PS2 18 (SUTURE) ×3 IMPLANT
SUT VICRYL 3-0 CR8 SH (SUTURE) ×3 IMPLANT
SYR 10ML LL (SYRINGE) ×3 IMPLANT
TOWEL GREEN STERILE FF (TOWEL DISPOSABLE) ×3 IMPLANT
TUBE CONNECTING 20'X1/4 (TUBING)
TUBE CONNECTING 20X1/4 (TUBING) IMPLANT
YANKAUER SUCT BULB TIP NO VENT (SUCTIONS) IMPLANT

## 2018-09-27 NOTE — Anesthesia Preprocedure Evaluation (Signed)
Anesthesia Evaluation  Patient identified by MRN, date of birth, ID band Patient awake    Reviewed: Allergy & Precautions, NPO status , Patient's Chart, lab work & pertinent test results  History of Anesthesia Complications Negative for: history of anesthetic complications  Airway Mallampati: II  TM Distance: >3 FB Neck ROM: Full    Dental  (+) Teeth Intact   Pulmonary shortness of breath, asthma , former smoker,    breath sounds clear to auscultation       Cardiovascular hypertension, Pt. on medications (-) angina+ CAD   Rhythm:Regular     Neuro/Psych negative neurological ROS  negative psych ROS   GI/Hepatic negative GI ROS, Neg liver ROS,   Endo/Other  diabetes, Type 2  Renal/GU negative Renal ROS     Musculoskeletal   Abdominal   Peds  Hematology negative hematology ROS (+)   Anesthesia Other Findings   Reproductive/Obstetrics                             Anesthesia Physical  Anesthesia Plan  ASA: II  Anesthesia Plan: MAC   Post-op Pain Management:    Induction: Intravenous  PONV Risk Score and Plan: 2 and Ondansetron  Airway Management Planned: Nasal Cannula and Simple Face Mask  Additional Equipment: None  Intra-op Plan:   Post-operative Plan:   Informed Consent: I have reviewed the patients History and Physical, chart, labs and discussed the procedure including the risks, benefits and alternatives for the proposed anesthesia with the patient or authorized representative who has indicated his/her understanding and acceptance.     Dental advisory given  Plan Discussed with: CRNA and Surgeon  Anesthesia Plan Comments:         Anesthesia Quick Evaluation

## 2018-09-27 NOTE — Transfer of Care (Signed)
Immediate Anesthesia Transfer of Care Note  Patient: Penny Huynh  Procedure(s) Performed: REMOVAL PORT-A-CATH (Right Chest)  Patient Location: PACU  Anesthesia Type:MAC  Level of Consciousness: awake, alert , oriented and patient cooperative  Airway & Oxygen Therapy: Patient Spontanous Breathing  Post-op Assessment: Report given to RN, Post -op Vital signs reviewed and stable and Patient moving all extremities X 4  Post vital signs: Reviewed and stable  Last Vitals:  Vitals Value Taken Time  BP    Temp    Pulse    Resp    SpO2      Last Pain:  Vitals:   09/27/18 0741  TempSrc: Oral  PainSc: 5       Patients Stated Pain Goal: 5 (05/69/79 4801)  Complications: No apparent anesthesia complications

## 2018-09-27 NOTE — Interval H&P Note (Signed)
History and Physical Interval Note:  09/27/2018 7:28 AM  Penny Huynh  has presented today for surgery, with the diagnosis of LEFT BREAST CANCER.  The various methods of treatment have been discussed with the patient and family. After consideration of risks, benefits and other options for treatment, the patient has consented to  Procedure(s): REMOVAL PORT-A-CATH (N/A) as a surgical intervention.  The patient's history has been reviewed, patient examined, no change in status, stable for surgery.  I have reviewed the patient's chart and labs.  Questions were answered to the patient's satisfaction.     Adin Hector

## 2018-09-27 NOTE — Anesthesia Postprocedure Evaluation (Signed)
Anesthesia Post Note  Patient: Penny Huynh  Procedure(s) Performed: REMOVAL PORT-A-CATH (Right Chest)     Patient location during evaluation: PACU Anesthesia Type: MAC Level of consciousness: awake and alert Pain management: pain level controlled Vital Signs Assessment: post-procedure vital signs reviewed and stable Respiratory status: spontaneous breathing, nonlabored ventilation and respiratory function stable Cardiovascular status: stable and blood pressure returned to baseline Anesthetic complications: no    Last Vitals:  Vitals:   09/27/18 1016 09/27/18 1030  BP: (!) 194/75 (!) 183/73  Pulse: (!) 56 (!) 58  Resp: 13 13  Temp:    SpO2: 100% 100%    Last Pain:  Vitals:   09/27/18 1030  TempSrc:   PainSc: 0-No pain                 Audry Pili

## 2018-09-27 NOTE — Discharge Instructions (Signed)
°  Post Anesthesia Home Care Instructions  Activity: Get plenty of rest for the remainder of the day. A responsible individual must stay with you for 24 hours following the procedure.  For the next 24 hours, DO NOT: -Drive a car -Paediatric nurse -Drink alcoholic beverages -Take any medication unless instructed by your physician -Make any legal decisions or sign important papers.  Meals: Start with liquid foods such as gelatin or soup. Progress to regular foods as tolerated. Avoid greasy, spicy, heavy foods. If nausea and/or vomiting occur, drink only clear liquids until the nausea and/or vomiting subsides. Call your physician if vomiting continues.  Special Instructions/Symptoms: Your throat may feel dry or sore from the anesthesia or the breathing tube placed in your throat during surgery. If this causes discomfort, gargle with warm salt water. The discomfort should disappear within 24 hours.  If you had a scopolamine patch placed behind your ear for the management of post- operative nausea and/or vomiting:  1. The medication in the patch is effective for 72 hours, after which it should be removed.  Wrap patch in a tissue and discard in the trash. Wash hands thoroughly with soap and water. 2. You may remove the patch earlier than 72 hours if you experience unpleasant side effects which may include dry mouth, dizziness or visual disturbances. 3. Avoid touching the patch. Wash your hands with soap and water after contact with the patch.       High-dose Tylenol or high-dose ibuprofen will likely be all you need for pain  We will call in something stronger to your pharmacy in case you need it  Ice pack for 10 minutes at a time, often known for 24 hours  You may shower starting tomorrow but no tub baths for a couple of weeks  You may drive your car when you are comfortable and do not require any narcotic pain medicine  You are due for mammograms in January, 2021  Dr. Dalbert Batman will be  retired in 2021 so you will need to make an appointment with 1 of the other breast cancer surgeons in our practice.  Just give [place] a call and we will set that up  Dont take tylenol until 2pm!

## 2018-09-27 NOTE — Op Note (Signed)
Patient Name:           Penny Huynh   Date of Surgery:        09/27/2018  Pre op Diagnosis:      Cancer left breast, Port-A-Cath in place  Post op Diagnosis:    Same  Procedure:                 Removal of Port-A-Cath  Surgeon:                     Edsel Petrin. Dalbert Batman, M.D., FACS  Assistant:                      Or staff  Operative Indications:        This is an 85 year old patient [name] is her oncologist. [name] is her PCP. She is followed by [facility] cardiology.      On February 23, 2017 she underwent left total mastectomy and sentinel lymph node biopsy. She had 2 cancers, 3.3 cm 4.8 cm. Receptor positive. One of these was HER-2 positive and one was HER-2 negative. All 4 sentinel nodes negative. Port-A-Cath inserted in the right internal jugular vein. She has completed radiation therapy and chemotherapy. She requests Port-A-Cath removal. Last seen by [name] on August 20, 2017. She is followed closely by [name]. She takes anastrozole      Past history reveals she takes aspirin prophylactically. Hypertension. Coronary artery disease but never had an MI. Non-insulin-dependent diabetes. Has neuropathy secondary to diabetes and TDM 1.       Exam today was negative for any evidence of recurrent cancer She will be scheduled for removal of Port-A-Cath under monitored sedation I discussed the indications, details, techniques, and numerous risk of surgery with her. She is aware of the risk of bleeding, infection, difficulty removal requiring neck incision is well. She understands all these issues. All of her questions are answered. She agrees with this plan. Because of age and cardiac history this will need to be done at [facility] or [facility].  Operative Findings:       The port was removed uneventfully.  The port was in the right infraclavicular position and the catheter went up into the right internal jugular vein.  Procedure in Detail:          The patient was brought to the  operating room and placed supine on the operating table.  She was monitored and sedated by the anesthesia department.  The right neck and right upper chest were prepped and draped in a sterile fashion.  Surgical timeout was performed.  1% Xylocaine with epinephrine was used as a local infiltration anesthetic.  I made a transverse incision with a knife over the port through the old scar.  Dissection was carried down to the port.  The capsule was incised.  The Prolene sutures were removed.  The port and catheter were removed uneventfully.  There was no bleeding and no sign of infection.  The deeper tissues were closed with 3-0 Vicryl sutures and the skin closed with a running subcuticular 4-0 Monocryl and Dermabond.  Patient tolerated the procedure well and was taken to PACU in stable condition.  EBL 5 cc.  Counts correct.  Complications none.     Edsel Petrin. Dalbert Batman, M.D., FACS General and Minimally Invasive Surgery Breast and Colorectal Surgery  09/27/2018 9:58 AM

## 2018-09-28 ENCOUNTER — Encounter (HOSPITAL_BASED_OUTPATIENT_CLINIC_OR_DEPARTMENT_OTHER): Payer: Self-pay | Admitting: General Surgery

## 2018-09-28 NOTE — Telephone Encounter (Signed)
Pt requests refill  

## 2018-09-29 ENCOUNTER — Other Ambulatory Visit: Payer: Self-pay | Admitting: Internal Medicine

## 2018-09-30 ENCOUNTER — Other Ambulatory Visit: Payer: Self-pay | Admitting: Internal Medicine

## 2018-09-30 MED ORDER — PREGABALIN 75 MG PO CAPS: 150.0000 mg | ORAL_CAPSULE | Freq: Two times a day (BID) | ORAL | 1 refills | Status: DC

## 2018-10-31 IMAGING — MG MM CLIP PLACEMENT
5 series · 5 of 5 positions shown · non-contrast
Comparison: Previous exam(s).

ADDENDUM:
Patient presented for radioactive seed localization of recent
biopsied negative left axillary node. I was unable to
sonographically localize the HydroMARK clip within the biopsied node
to allow confident placement of radioactive seed, therefore the
radioactive seed was not placed at this time. This was conveyed to
medical Prezes August at [REDACTED] [DATE]
p.m. 02/22/2017.
CLINICAL DATA: Status post ultrasound-guided core needle biopsies
of masses in the 2:30 o'clock and 10:30 o'clock positions of the
left breast and a left axillary lymph node.

EXAM:
DIAGNOSTIC LEFT MAMMOGRAM POST ULTRASOUND BIOPSY

[L ML]
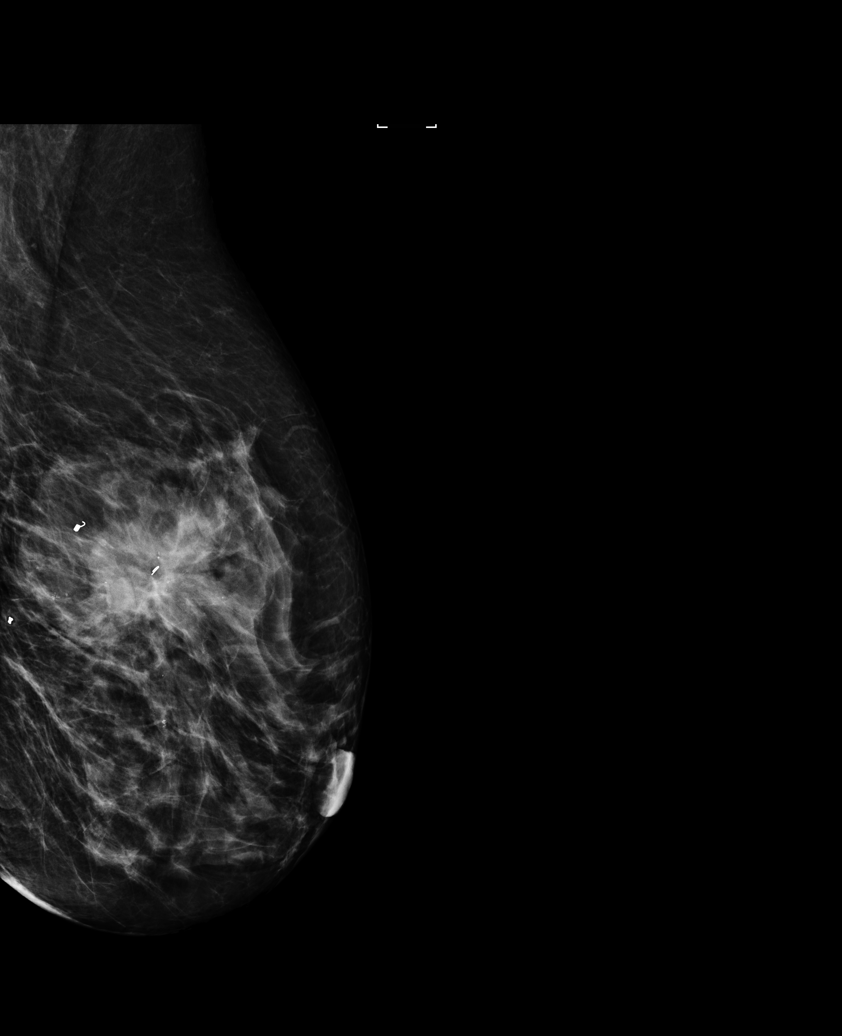

[L MLO (1 of 2)]
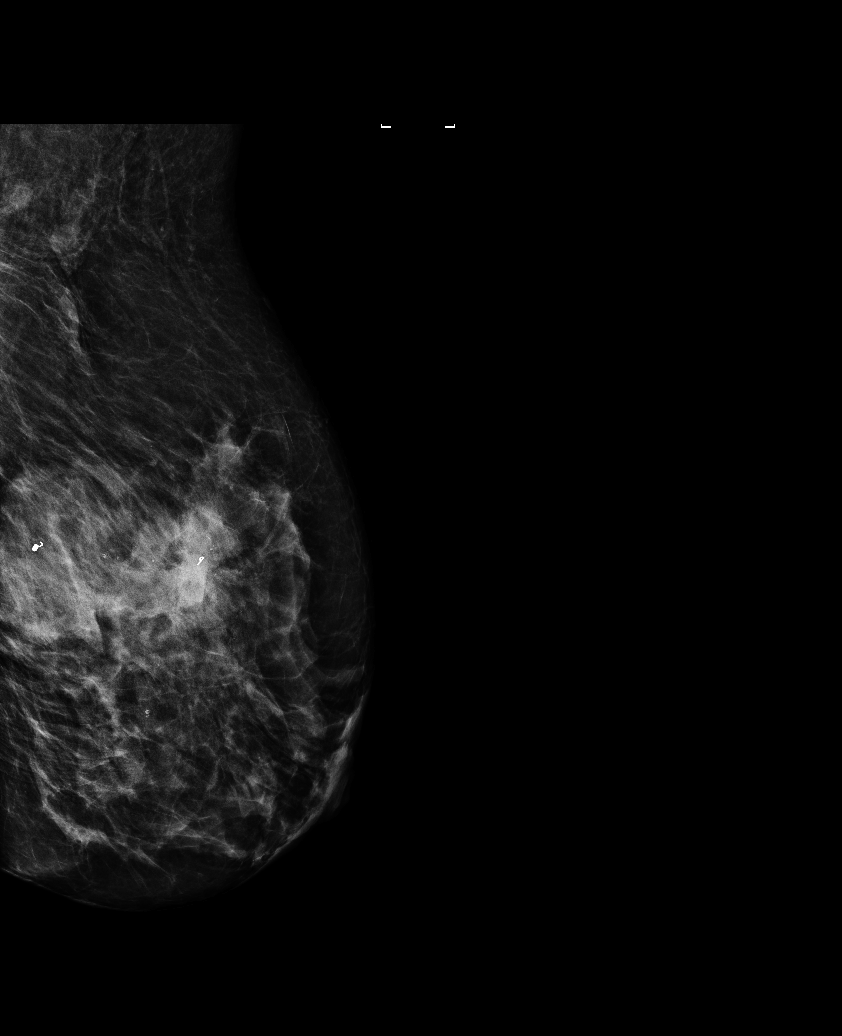

[L CC]
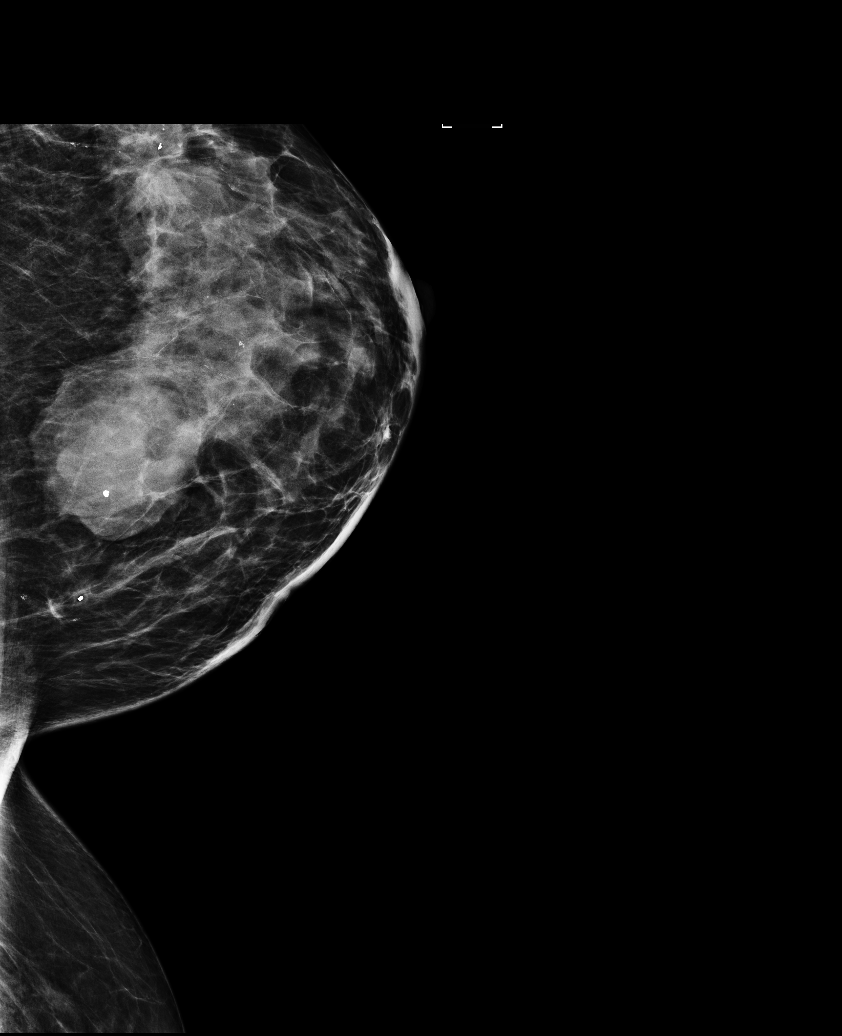

[L MLO (2 of 2)]
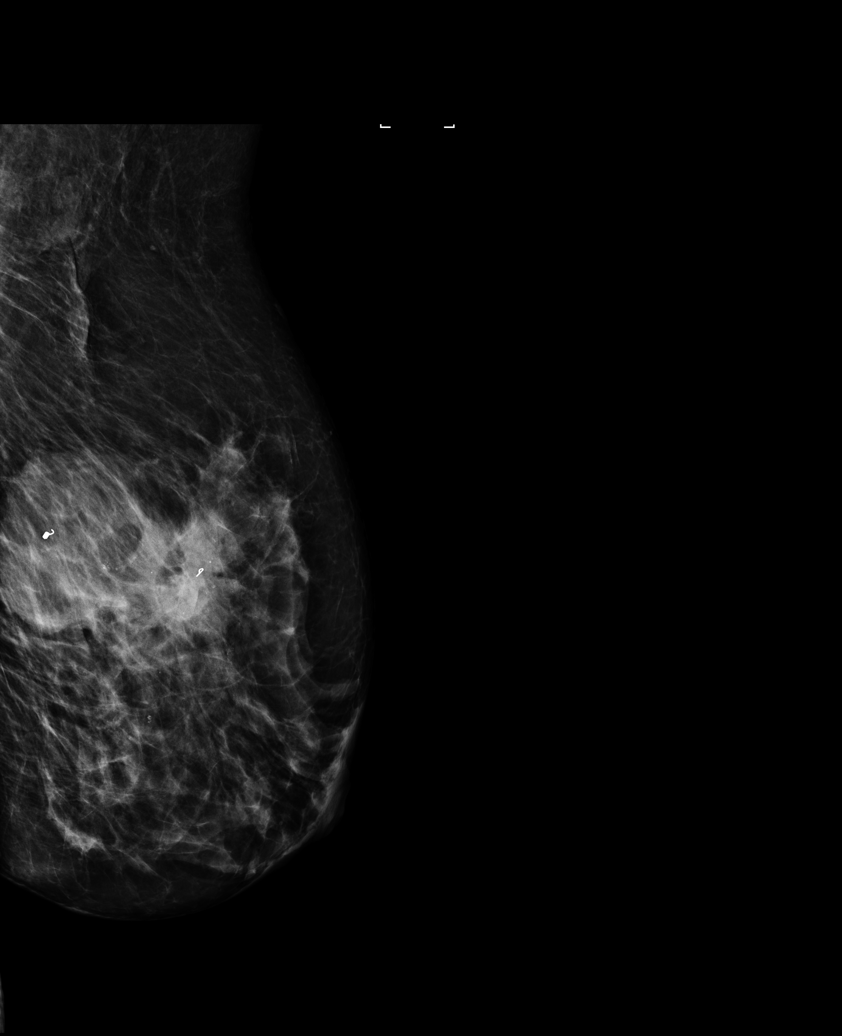

[L XCCL]
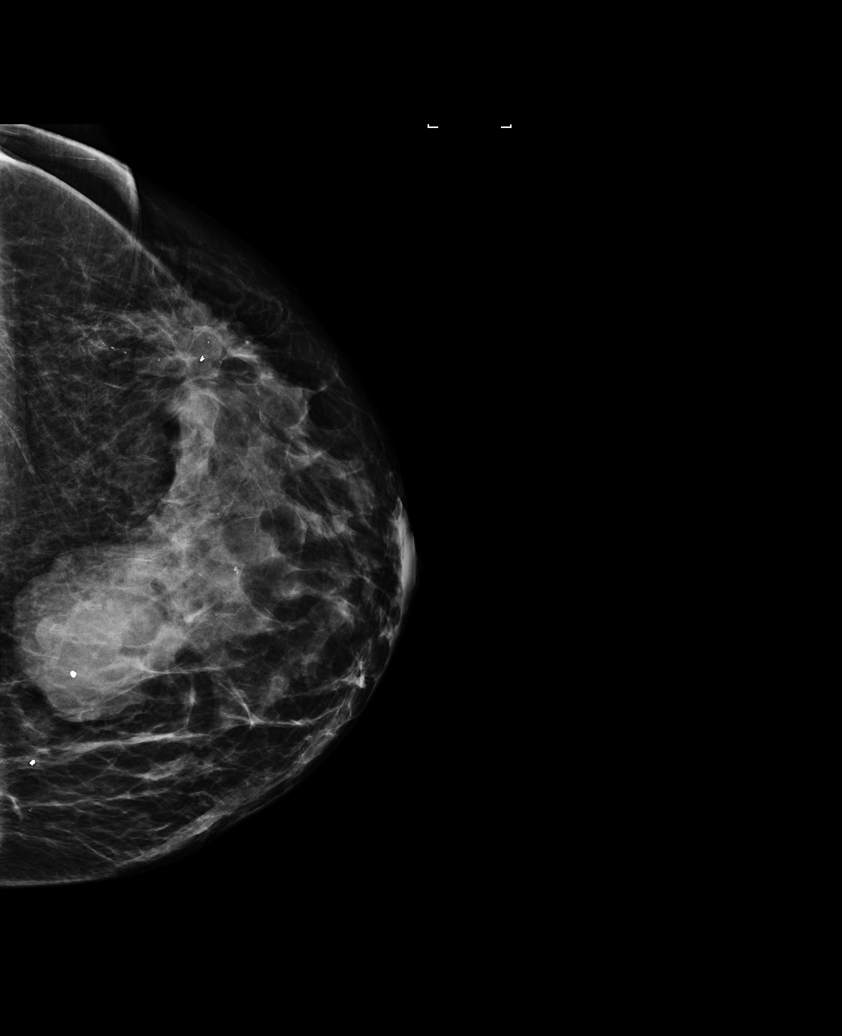

[5 of 5 positions shown; findings below may reference images not displayed]

FINDINGS: Mammographic images were obtained following ultrasound guided biopsy
of masses in the 2:30 o'clock and 10:30 o'clock positions of the
left breast and a left axillary lymph node. These demonstrate a coil
shaped biopsy marker clip in the biopsied mass in the 10:30 o'clock
position of the left breast. There is a ribbon shaped biopsy marker
clip in the biopsied mass in the 2:30 o'clock position of the left
breast. The spiral shaped HydroMARK clip in the biopsied left
axillary lymph node is not included.
IMPRESSION: Appropriate deployment of the coil shaped and ribbon shaped biopsy
marker clips.

Final Assessment: Post Procedure Mammograms for Marker Placement

## 2018-11-04 DIAGNOSIS — E7800 Pure hypercholesterolemia, unspecified: Secondary | ICD-10-CM | POA: Diagnosis not present

## 2018-11-04 DIAGNOSIS — E1165 Type 2 diabetes mellitus with hyperglycemia: Secondary | ICD-10-CM | POA: Diagnosis not present

## 2018-11-04 DIAGNOSIS — E871 Hypo-osmolality and hyponatremia: Secondary | ICD-10-CM | POA: Diagnosis not present

## 2018-11-04 DIAGNOSIS — R5383 Other fatigue: Secondary | ICD-10-CM | POA: Diagnosis not present

## 2018-11-04 DIAGNOSIS — E876 Hypokalemia: Secondary | ICD-10-CM | POA: Diagnosis not present

## 2018-11-04 DIAGNOSIS — E1140 Type 2 diabetes mellitus with diabetic neuropathy, unspecified: Secondary | ICD-10-CM | POA: Diagnosis not present

## 2018-11-04 DIAGNOSIS — I10 Essential (primary) hypertension: Secondary | ICD-10-CM | POA: Diagnosis not present

## 2018-11-08 DIAGNOSIS — E871 Hypo-osmolality and hyponatremia: Secondary | ICD-10-CM | POA: Diagnosis not present

## 2018-11-08 DIAGNOSIS — M810 Age-related osteoporosis without current pathological fracture: Secondary | ICD-10-CM | POA: Insufficient documentation

## 2018-11-08 DIAGNOSIS — J45909 Unspecified asthma, uncomplicated: Secondary | ICD-10-CM | POA: Diagnosis not present

## 2018-11-08 DIAGNOSIS — Z6831 Body mass index (BMI) 31.0-31.9, adult: Secondary | ICD-10-CM | POA: Diagnosis not present

## 2018-11-08 DIAGNOSIS — Z130 Encounter for screening for diseases of the blood and blood-forming organs and certain disorders involving the immune mechanism: Secondary | ICD-10-CM | POA: Diagnosis not present

## 2018-11-08 DIAGNOSIS — M654 Radial styloid tenosynovitis [de Quervain]: Secondary | ICD-10-CM | POA: Diagnosis not present

## 2018-11-08 DIAGNOSIS — Z23 Encounter for immunization: Secondary | ICD-10-CM | POA: Diagnosis not present

## 2018-11-08 DIAGNOSIS — E782 Mixed hyperlipidemia: Secondary | ICD-10-CM | POA: Diagnosis not present

## 2018-11-08 DIAGNOSIS — R252 Cramp and spasm: Secondary | ICD-10-CM | POA: Diagnosis not present

## 2018-11-08 DIAGNOSIS — E1140 Type 2 diabetes mellitus with diabetic neuropathy, unspecified: Secondary | ICD-10-CM | POA: Diagnosis not present

## 2018-11-08 DIAGNOSIS — E6609 Other obesity due to excess calories: Secondary | ICD-10-CM | POA: Diagnosis not present

## 2018-11-08 DIAGNOSIS — Z1331 Encounter for screening for depression: Secondary | ICD-10-CM | POA: Diagnosis not present

## 2018-11-08 DIAGNOSIS — E1165 Type 2 diabetes mellitus with hyperglycemia: Secondary | ICD-10-CM | POA: Diagnosis not present

## 2018-11-08 DIAGNOSIS — I10 Essential (primary) hypertension: Secondary | ICD-10-CM | POA: Diagnosis not present

## 2018-11-10 DIAGNOSIS — Z961 Presence of intraocular lens: Secondary | ICD-10-CM | POA: Diagnosis not present

## 2018-11-10 DIAGNOSIS — H401131 Primary open-angle glaucoma, bilateral, mild stage: Secondary | ICD-10-CM | POA: Diagnosis not present

## 2018-11-30 ENCOUNTER — Other Ambulatory Visit: Payer: Self-pay | Admitting: Internal Medicine

## 2018-11-30 ENCOUNTER — Telehealth: Payer: Self-pay | Admitting: *Deleted

## 2018-11-30 NOTE — Telephone Encounter (Signed)
Received vm message from patient requesting a refill on her  Pregabalin (lyrica) 75 mg 2 capsules BID.

## 2018-12-01 IMAGING — NM NM BONE WHOLE BODY
2 series · 2 of 2 positions shown · non-contrast
Comparison: None.

CLINICAL DATA: Left breast carcinoma.  Initial staging.

EXAM:
NUCLEAR MEDICINE WHOLE BODY BONE SCAN
TECHNIQUE: Whole body anterior and posterior images were obtained approximately
3 hours after intravenous injection of radiopharmaceutical.
RADIOPHARMACEUTICALS:  20.4 mCi Gechnetium-XXm MDP IV

[Series 1: whole body · 2.66mm/px · 1 of 1 slices shown (1 of 2)]
[im 1/1]
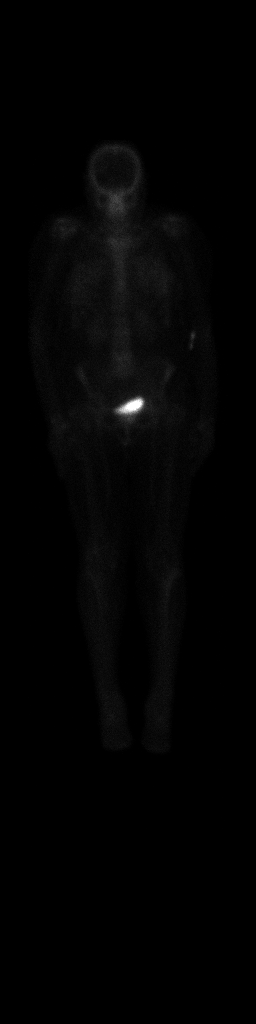

[Series 1: whole body · 2.66mm/px · 1 of 1 slices shown (2 of 2)]
[im 1/1]
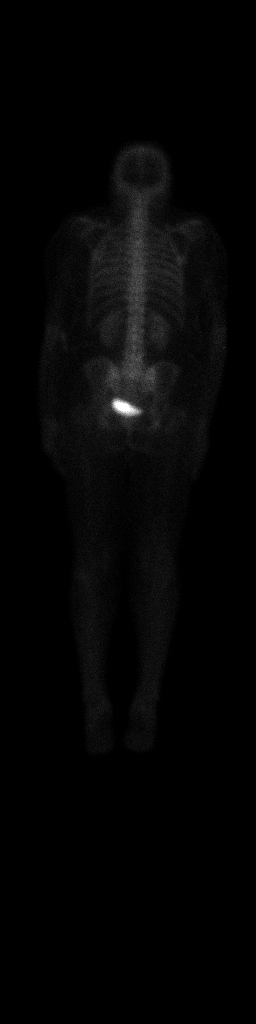

[2 of 2 positions shown; findings below may reference images not displayed]

FINDINGS: Normal examination. No abnormal skeletal activity. Injection site
activity in the left antecubital fossa.
IMPRESSION: Negative for evidence of bony metastatic disease.

## 2018-12-06 IMAGING — DX DG CHEST 1V PORT
1 series · 1 of 1 positions shown · non-contrast
Comparison: 02/18/2017

CLINICAL DATA: Status post Port-A-Cath placement

EXAM:
PORTABLE CHEST 1 VIEW

[chest ap]
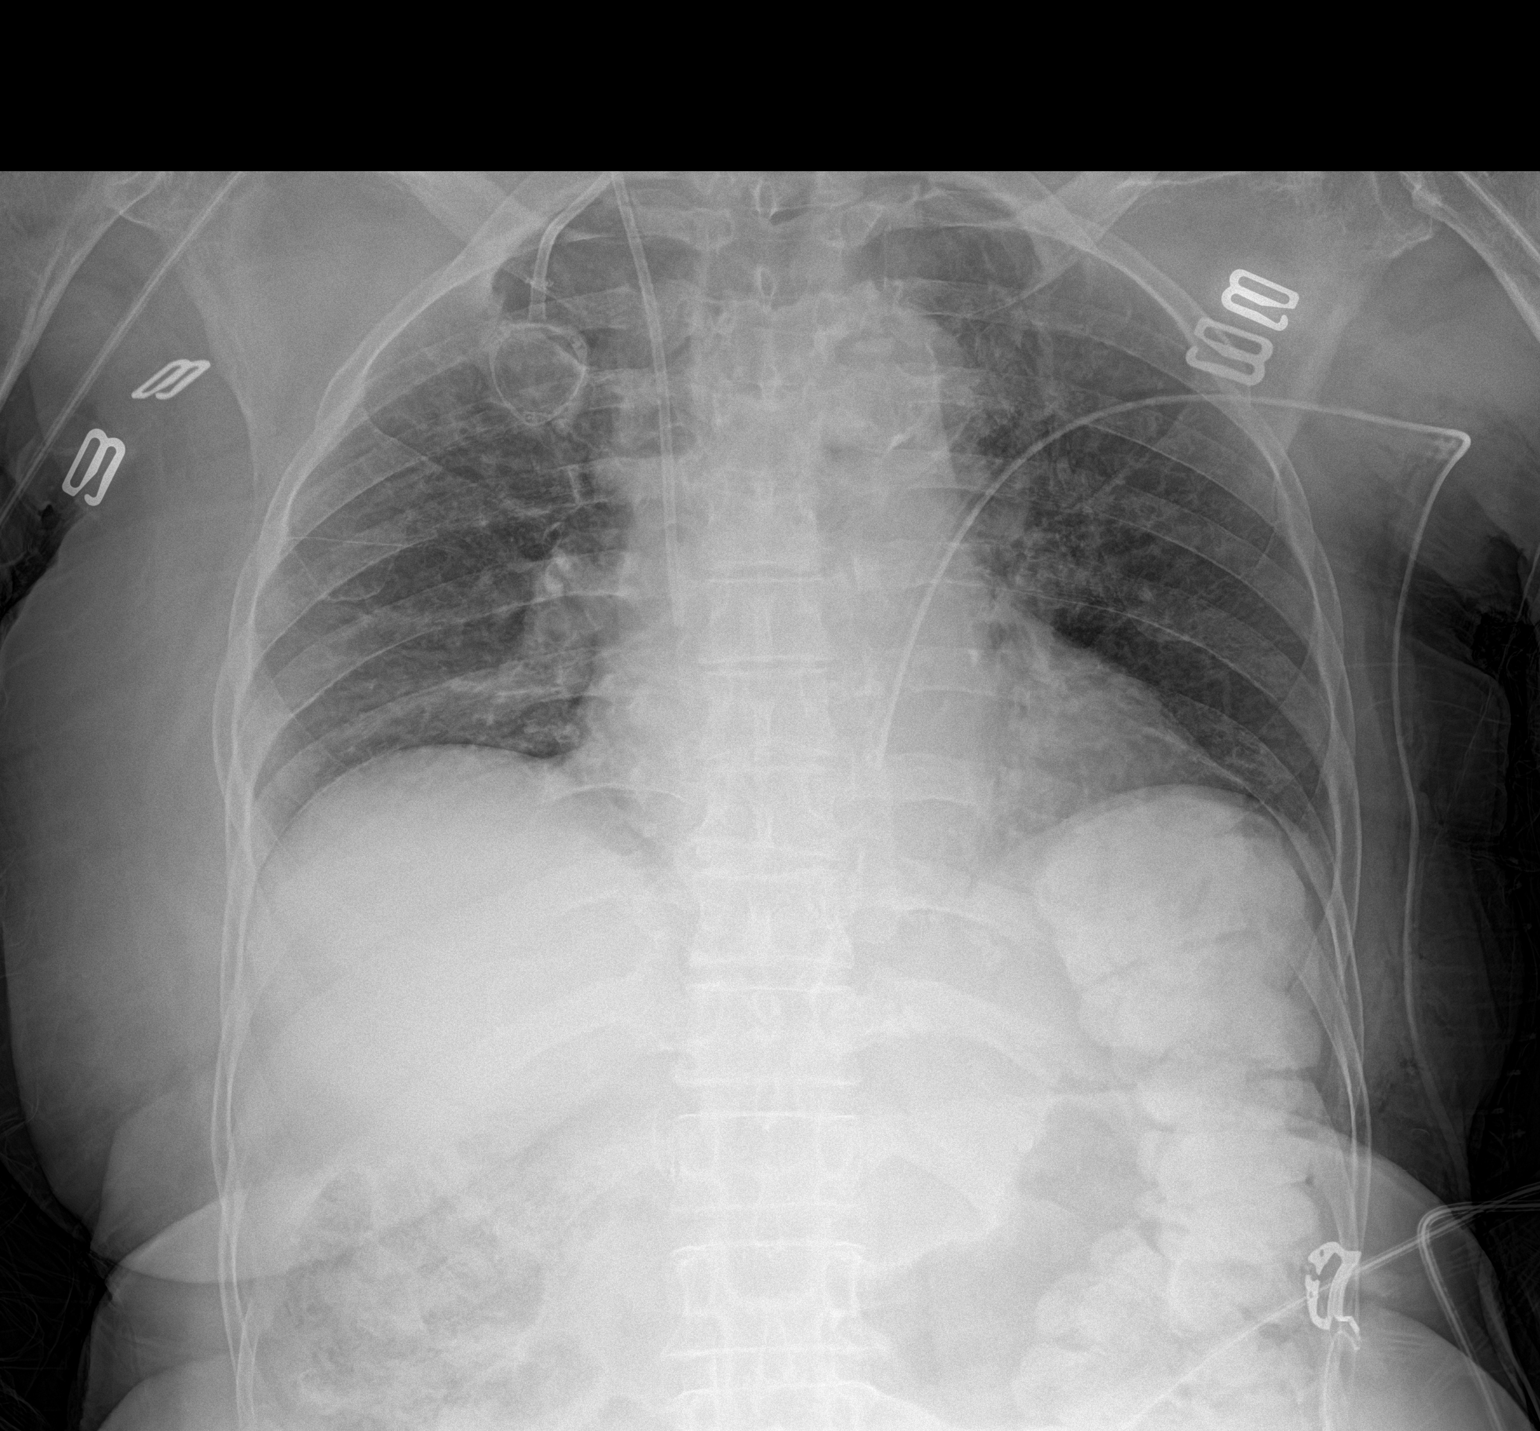

[1 of 1 positions shown; findings below may reference images not displayed]

FINDINGS: Cardiac shadow is mildly enlarged. Surgical drain is noted over the
left chest consistent with the recent mastectomy. Right chest wall
port is noted with catheter tip at the cavoatrial junction. No
pneumothorax is seen. The overall inspiratory effort is poor. No
bony abnormality is seen.
IMPRESSION: No pneumothorax following port placement.

## 2018-12-07 DIAGNOSIS — I739 Peripheral vascular disease, unspecified: Secondary | ICD-10-CM | POA: Diagnosis not present

## 2018-12-07 DIAGNOSIS — M79671 Pain in right foot: Secondary | ICD-10-CM | POA: Diagnosis not present

## 2018-12-07 DIAGNOSIS — M79672 Pain in left foot: Secondary | ICD-10-CM | POA: Diagnosis not present

## 2018-12-31 ENCOUNTER — Other Ambulatory Visit: Payer: Self-pay | Admitting: Hematology

## 2019-01-06 ENCOUNTER — Other Ambulatory Visit: Payer: Self-pay | Admitting: Family Medicine

## 2019-01-06 DIAGNOSIS — Z1231 Encounter for screening mammogram for malignant neoplasm of breast: Secondary | ICD-10-CM

## 2019-01-16 DIAGNOSIS — Z20828 Contact with and (suspected) exposure to other viral communicable diseases: Secondary | ICD-10-CM | POA: Diagnosis not present

## 2019-01-16 DIAGNOSIS — J069 Acute upper respiratory infection, unspecified: Secondary | ICD-10-CM | POA: Diagnosis not present

## 2019-02-01 ENCOUNTER — Other Ambulatory Visit: Payer: Self-pay | Admitting: Internal Medicine

## 2019-02-02 NOTE — Telephone Encounter (Signed)
Please review for refill.  

## 2019-02-14 DIAGNOSIS — Z23 Encounter for immunization: Secondary | ICD-10-CM | POA: Diagnosis not present

## 2019-02-22 DIAGNOSIS — H401131 Primary open-angle glaucoma, bilateral, mild stage: Secondary | ICD-10-CM | POA: Diagnosis not present

## 2019-02-28 ENCOUNTER — Ambulatory Visit
Admission: RE | Admit: 2019-02-28 | Discharge: 2019-02-28 | Disposition: A | Discharge status: Home / Self Care | Payer: Medicare Other | Source: Ambulatory Visit | Attending: Family Medicine | Admitting: Family Medicine

## 2019-02-28 ENCOUNTER — Other Ambulatory Visit: Payer: Self-pay

## 2019-02-28 DIAGNOSIS — Z1231 Encounter for screening mammogram for malignant neoplasm of breast: Secondary | ICD-10-CM

## 2019-03-13 DIAGNOSIS — E871 Hypo-osmolality and hyponatremia: Secondary | ICD-10-CM | POA: Diagnosis not present

## 2019-03-13 DIAGNOSIS — I10 Essential (primary) hypertension: Secondary | ICD-10-CM | POA: Diagnosis not present

## 2019-03-13 DIAGNOSIS — E7800 Pure hypercholesterolemia, unspecified: Secondary | ICD-10-CM | POA: Diagnosis not present

## 2019-03-13 DIAGNOSIS — E876 Hypokalemia: Secondary | ICD-10-CM | POA: Diagnosis not present

## 2019-03-13 DIAGNOSIS — E782 Mixed hyperlipidemia: Secondary | ICD-10-CM | POA: Diagnosis not present

## 2019-03-13 DIAGNOSIS — E1165 Type 2 diabetes mellitus with hyperglycemia: Secondary | ICD-10-CM | POA: Diagnosis not present

## 2019-03-13 DIAGNOSIS — E1140 Type 2 diabetes mellitus with diabetic neuropathy, unspecified: Secondary | ICD-10-CM | POA: Diagnosis not present

## 2019-03-13 DIAGNOSIS — R5383 Other fatigue: Secondary | ICD-10-CM | POA: Diagnosis not present

## 2019-03-16 ENCOUNTER — Other Ambulatory Visit: Payer: Self-pay

## 2019-03-16 ENCOUNTER — Telehealth (HOSPITAL_COMMUNITY): Payer: Self-pay

## 2019-03-16 DIAGNOSIS — I739 Peripheral vascular disease, unspecified: Secondary | ICD-10-CM

## 2019-03-16 DIAGNOSIS — R252 Cramp and spasm: Secondary | ICD-10-CM | POA: Diagnosis not present

## 2019-03-16 DIAGNOSIS — E1165 Type 2 diabetes mellitus with hyperglycemia: Secondary | ICD-10-CM | POA: Diagnosis not present

## 2019-03-16 DIAGNOSIS — I10 Essential (primary) hypertension: Secondary | ICD-10-CM | POA: Diagnosis not present

## 2019-03-16 DIAGNOSIS — E871 Hypo-osmolality and hyponatremia: Secondary | ICD-10-CM | POA: Diagnosis not present

## 2019-03-16 DIAGNOSIS — E1140 Type 2 diabetes mellitus with diabetic neuropathy, unspecified: Secondary | ICD-10-CM | POA: Diagnosis not present

## 2019-03-16 DIAGNOSIS — Z6831 Body mass index (BMI) 31.0-31.9, adult: Secondary | ICD-10-CM | POA: Diagnosis not present

## 2019-03-16 DIAGNOSIS — E782 Mixed hyperlipidemia: Secondary | ICD-10-CM | POA: Diagnosis not present

## 2019-03-16 DIAGNOSIS — J45909 Unspecified asthma, uncomplicated: Secondary | ICD-10-CM | POA: Diagnosis not present

## 2019-03-16 NOTE — Telephone Encounter (Signed)

## 2019-03-17 ENCOUNTER — Ambulatory Visit (HOSPITAL_COMMUNITY)
Admission: RE | Admit: 2019-03-17 | Discharge: 2019-03-17 | Disposition: A | Discharge status: Home / Self Care | Payer: Medicare Other | Source: Ambulatory Visit | Attending: Vascular Surgery | Admitting: Vascular Surgery

## 2019-03-17 ENCOUNTER — Ambulatory Visit (INDEPENDENT_AMBULATORY_CARE_PROVIDER_SITE_OTHER): Payer: Medicare Other | Admitting: Physician Assistant

## 2019-03-17 ENCOUNTER — Other Ambulatory Visit: Payer: Self-pay

## 2019-03-17 DIAGNOSIS — I739 Peripheral vascular disease, unspecified: Secondary | ICD-10-CM | POA: Insufficient documentation

## 2019-03-17 NOTE — Progress Notes (Signed)
Established PAD   History of Present Illness   Penny Huynh is a 86 y.o. (1933/07/18) female who presents to go over vascular studies related to PAD.  Since last office visit she denies any claudication, rest pain, or nonhealing wounds of bilateral lower extremities.  She is taking an aspirin daily.  Past medical history also significant for diabetes mellitus.  She is also evaluated by podiatry for diabetic foot care.  She is a former smoker.  The patient's PMH, PSH, SH, and FamHx were reviewed and are unchanged from prior visit.  Current Outpatient Medications  Medication Sig Dispense Refill  . albuterol (VENTOLIN HFA) 108 (90 Base) MCG/ACT inhaler Inhale into the lungs every 6 (six) hours as needed for wheezing or shortness of breath.    Marland Kitchen amLODipine (NORVASC) 5 MG tablet TAKE 1 TABLET BY MOUTH ONCE DAILY 90 tablet 0  . anastrozole (ARIMIDEX) 1 MG tablet TAKE 1 TABLET BY MOUTH EVERY DAY 90 tablet 1  . aspirin 81 MG EC tablet Take 81 mg by mouth at bedtime.     . chlorthalidone (HYGROTON) 25 MG tablet Take 25 mg by mouth daily.     . Coenzyme Q10 (COQ10) 100 MG CAPS Take 100 mg by mouth daily.      . fluorometholone (FML) 0.1 % ophthalmic suspension Place 1 drop into both eyes every 4 (four) hours.    Marland Kitchen latanoprost (XALATAN) 0.005 % ophthalmic solution Place 1 drop into both eyes at bedtime.    . lidocaine-prilocaine (EMLA) cream APPLY CREAM TOPICALLY TO AFFECTED AREA ONCE  3  . losartan (COZAAR) 50 MG tablet Take 1 tablet (50 mg total) by mouth 2 (two) times daily. 180 tablet 3  . metFORMIN (GLUCOPHAGE) 500 MG tablet Take 1,000 mg by mouth daily with breakfast.     . Multiple Vitamin (MULTIVITAMIN WITH MINERALS) TABS tablet Take 1 tablet by mouth daily.    . Omega-3 Fatty Acids (FISH OIL) 1000 MG CAPS Take by mouth.    . pregabalin (LYRICA) 75 MG capsule TAKE 1 CAPSULE BY MOUTH TWICE A DAY FOR 7 DAYS- THEN INCREASE TO 2 CAPSULES TWICE A DAY 120 capsule 1  . traMADol (ULTRAM) 50  MG tablet Take 1 tablet (50 mg total) by mouth every 6 (six) hours as needed for moderate pain (neuropathic pain). 60 tablet 0  . vitamin B-12 (CYANOCOBALAMIN) 1000 MCG tablet Take 1,000 mcg by mouth daily.    . Vitamin D, Cholecalciferol, 25 MCG (1000 UT) CAPS Take by mouth.    Marland Kitchen HYDROcodone-acetaminophen (NORCO) 5-325 MG tablet Take 1-2 tablets by mouth every 6 (six) hours as needed for moderate pain or severe pain. (Patient not taking: Reported on 03/17/2019) 15 tablet 0   No current facility-administered medications for this visit.    On ROS today: 10 system ROS negative unless otherwise noted in HPI   Physical Examination   Vitals:   03/17/19 1049  BP: (!) 164/84  Pulse: 76  Resp: 14  Temp: (!) 97.3 F (36.3 C)  TempSrc: Temporal  SpO2: 100%  Weight: 175 lb (79.4 kg)  Height: 5\' 4"  (1.626 m)   Body mass index is 30.04 kg/m.  General Alert, O x 3, WD, NAD  Pulmonary Sym exp, good B air movt,  Cardiac RRR, Nl S1, S2,  Vascular Vessel Right Left  Radial Palpable Palpable  PT Not palpable Not palpable  DP Not palpable Not palpable    Gastro- intestinal soft, non-distended,   Musculo- skeletal M/S  5/5 throughout  , Extremities without ischemic changes  , No edema present, No visible varicosities , No Lipodermatosclerosis present  Neurologic Pain and light touch intact in extremities , Motor exam as listed above    Non-Invasive Vascular imaging   ABI/TBIToday's ABIToday's TBIPrevious ABIPrevious TBI  +-------+-----------+-----------+------------+------------+  Right 0.71    0.44    0.54    0.44      +-------+-----------+-----------+------------+------------+  Left  0.59    0.59    0.69    0.41       Medical Decision Making   Penny Huynh is a 86 y.o. female who presents for repeat ABI to evaluate PAD   ABIs essentially unchanged compared to 08/2018  No rest pain or non healing wounds bilateral lower  extremities  Patient should have adequate circulation to heal toe nail intervention by Podiatry however she is aware of risk  Follow up in 1 year with ABIs   Dagoberto Ligas PA-C [facility] Office: [facility] MD: Donzetta Matters

## 2019-03-20 ENCOUNTER — Inpatient Hospital Stay: Payer: Medicare Other | Attending: Hematology

## 2019-03-20 ENCOUNTER — Inpatient Hospital Stay: Payer: Medicare Other | Admitting: Hematology

## 2019-03-21 ENCOUNTER — Other Ambulatory Visit: Payer: Self-pay | Admitting: *Deleted

## 2019-03-21 DIAGNOSIS — I739 Peripheral vascular disease, unspecified: Secondary | ICD-10-CM

## 2019-03-24 ENCOUNTER — Encounter: Payer: Self-pay | Admitting: Hematology

## 2019-03-24 ENCOUNTER — Telehealth: Payer: Self-pay | Admitting: Hematology

## 2019-03-24 NOTE — Telephone Encounter (Signed)
Scheduled per 2/19 sch msg. Unable to leave voicemail with appt details. Had to mail reminder letter and calendar.

## 2019-03-24 NOTE — Telephone Encounter (Signed)
Called pt & she reports that she didn't know she had an appt & apologized.  She thinks she may be having some problems & wonders if related to arimidex.  She reports being short-winded & takes her an hour to get dressed.  She has to sit down & rest just walking around in her house.  She has to rest between putting on socks & shoes.  She states that this has been going on for a while-greater than a month. She states that she does have asthmatic bronchitis & uses an inhaler.  She reports a cough with clear sputum.  She also mentioned having trouble with getting her words out which she says is not new.  She saw her PCP-Dr Quintin Alto last week & mentioned breathing issue but nothing was said.  She saw her heart dr in Dec.  She states she has some swelling of feet but goes down which is not new.  Message to Dr Burr Medico & schedulers to r/s missed appt.

## 2019-03-25 ENCOUNTER — Encounter: Payer: Self-pay | Admitting: Hematology

## 2019-03-27 ENCOUNTER — Telehealth: Payer: Self-pay | Admitting: Emergency Medicine

## 2019-03-27 DIAGNOSIS — Z23 Encounter for immunization: Secondary | ICD-10-CM | POA: Diagnosis not present

## 2019-03-27 NOTE — Telephone Encounter (Signed)
Calling in regards to pt's recent MyChart message asking to confirm upcoming lab appts.  Unable to message back via MyChart.  Called pt to confirm 3/2 Heartcare and 3/3 Lab/MD appts, pt verbalized understanding and denies any further questions/concerns at this time.

## 2019-03-30 ENCOUNTER — Ambulatory Visit: Payer: Medicare Other | Admitting: Hematology

## 2019-03-30 ENCOUNTER — Inpatient Hospital Stay: Payer: Medicare Other

## 2019-03-30 NOTE — Progress Notes (Signed)
[place]   Telephone:([phone]) [phone] Fax:(336) [phone]   Clinic Follow up Note   Patient Care Team: Sasser, Silvestre Moment, MD as PCP - General (Cardiology) [name], MD as PCP - Cardiology (Cardiology) Truitt Merle, MD as Consulting Physician (Hematology) Excell Seltzer, MD (Inactive) as Consulting Physician (General Surgery) Alla Feeling, NP as Nurse Practitioner (Nurse Practitioner) [name], MD as Attending Physician (Cardiology)  Date of Service:  04/05/2019  CHIEF COMPLAINT: F/u on left breast cancer  SUMMARY OF ONCOLOGIC HISTORY: Oncology History Overview Note  Cancer Staging Carcinoma of upper-inner quadrant of left breast in female, estrogen receptor positive ([place]) Staging form: Breast, AJCC 8th Edition - Clinical stage from 01/18/2017: Stage IB (cT2, cN0, cM0, G3, ER: Positive, PR: Positive, HER2: Positive) - Signed by Truitt Merle, MD on 02/10/2017 - Pathologic stage from 02/23/2017: Stage IB (pT3, pN0, cM0, G3, ER: Positive, PR: Positive, HER2: Positive) - Signed by Alla Feeling, NP on 03/16/2017  Carcinoma of upper-outer quadrant of left breast in female, estrogen receptor positive ([place]) Staging form: Breast, AJCC 8th Edition - Clinical stage from 01/18/2017: Stage IB (cT2, cN0, cM0, G2, ER: Positive, PR: Positive, HER2: Negative) - Unsigned - Pathologic stage from 02/23/2017: Stage IB (pT2, pN0(sn), cM0, G3, ER: Positive, PR: Positive, HER2: Negative) - Signed by Alla Feeling, NP on 03/16/2017     Carcinoma of upper-outer quadrant of left breast in female, estrogen receptor positive ([place])  01/13/2017 Mammogram   FINDINGS: In the left breast, a spiculated mass lies in the upper outer quadrant. In the medial posterior left breast, there is a larger lobulated mass. These masses correspond to the palpable abnormalities. No other discrete left breast masses.  In the right breast, there are no discrete masses. There are no areas of  architectural distortion.  In both breasts there are scattered calcifications, rounded punctate, with a few other larger coarse dystrophic calcifications, without significant change from the previous screening mammogram, which is dated 12/04/2009.  Mammographic images were processed with CAD.  On physical exam, there is a firm palpable mass in the upper outer left breast and another firm mass in the medial left breast.    01/13/2017 Breast US   Targeted ultrasound is performed, showing an irregular hypoechoic shadowing mass in the left breast at the 2:30 o'clock position, 5 cm the nipple, measuring 3.3 x 2.8 x 2.6 cm. In the 10:30 o'clock position of the left breast, 5 cm the nipple, there is irregular hypoechoic mass with somewhat more lobulated margins, corresponding to the lobulated mass seen mammographically. This measures 4.8 x 2.9 x 4.8 cm. Both masses show internal vascularity on color Doppler analysis.  Sonographic evaluation of the left axilla shows several nodes with thickened cortices. Status cortex measured is 5 mm. None of these lymph nodes appear enlarged but overall size criteria.  IMPRESSION: 1. Two masses in the left breast, 1 at the 2:30 o'clock position in the other at the 10:30 o'clock position, both highly suspicious for breast carcinoma. 2. Several abnormal left axillary lymph nodes with thickened cortices. 3. No evidence of right breast malignancy.    01/18/2017 Pathology Results   Diagnosis 1. Breast, left, needle core biopsy, 2:30 o'clock - INVASIVE MAMMARY CARCINOMA - MAMMARY CARCINOMA IN-SITU - SEE COMMENT 2. Lymph node, needle/core biopsy, left axilla - NO CARCINOMA IDENTIFIED - SEE COMMENT 3. Breast, left, needle core biopsy, 10:30 o'clock - INVASIVE DUCTAL CARCINOMA - SEE COMMENT  1. PROGNOSTIC INDICATORS Results: IMMUNOHISTOCHEMICAL AND MORPHOMETRIC  ANALYSIS PERFORMED MANUALLY Estrogen Receptor: 100%, POSITIVE, STRONG STAINING  INTENSITY Progesterone Receptor: 60%, POSITIVE, STRONG STAINING INTENSITY Proliferation Marker Ki67: 60%  1. FLUORESCENCE IN-SITU HYBRIDIZATION Results: HER2 - NEGATIVE RATIO OF HER2/CEP17 SIGNALS 1.28 AVERAGE HER2 COPY NUMBER PER CELL 2.75  3. PROGNOSTIC INDICATORS Results: IMMUNOHISTOCHEMICAL AND MORPHOMETRIC ANALYSIS PERFORMED MANUALLY Estrogen Receptor: 100%, POSITIVE, STRONG STAINING INTENSITY Progesterone Receptor: 50%, POSITIVE, STRONG STAINING INTENSITY Proliferation Marker Ki67: 70%  3. FLUORESCENCE IN-SITU HYBRIDIZATION Results: HER2 - **POSITIVE** RATIO OF HER2/CEP17 SIGNALS 2.80 AVERAGE HER2 COPY NUMBER PER CELL 6.15  Microscopic Comment 1. The biopsy material shows an infiltrative proliferation of cells with large vesicular nuclei with inconspicuous nucleoli, arranged linearly and in small clusters. Based on the biopsy, the carcinoma appears Nottingham grade 2 of 3 and measures 0.9 cm in greatest linear extent. E-cadherin and prognostic markers (ER/PR/ki-67/HER2-FISH)are pending and will be reported in an addendum. Dr. Lyndon Code reviewed the case and agrees with the above diagnosis.   02/10/2017 Initial Diagnosis   Carcinoma of upper-outer quadrant of left breast in female, estrogen receptor positive ([place])   02/19/2017 Imaging   Bone Scan whole Body 02/19/17 IMPRESSION: Negative for evidence of bony metastatic disease.   02/19/2017 Imaging   CT CAP W Contrast 02/19/17 IMPRESSION: 1. Small right middle lobe pulmonary nodules up to 0.8 cm, nonspecific. Non-contrast chest CT at 3-6 months is recommended. If the nodules are stable at time of repeat CT, then future CT at 18-24 months (from today's scan) is considered optional for low-risk patients, but is recommended for high-risk patients. This recommendation follows the consensus statement: Guidelines for Management of Incidental Pulmonary Nodules Detected on CT Images: From the Fleischner Society 2017; Radiology 2017;  284:228-243. 2. Subcentimeter hepatic lesions likely cysts but incompletely characterized due to small size. Attention on follow-up imaging recommended. 3. 2.1 cm fusiform right common iliac artery aneurysm. Continued surveillance recommended. 4. Aortic Atherosclerosis (ICD10-I70.0) and Emphysema (ICD10-J43.9).   02/23/2017 Pathology Results   Diagnosis 1. Breast, simple mastectomy, Left Total - INVASIVE DUCTAL CARCINOMA, MULTIFOCAL, NOTTINGHAM GRADE 3/3 (5.3 CM, 3.5 CM) - INVASIVE CARCINOMA INVOLVES THE DERMIS - DUCTAL CARCINOMA IN SITU, INTERMEDIATE GRADE - HYALINIZED FIBROADENOMA - MARGINS UNINVOLVED BY CARCINOMA - NO CARCINOMA IDENTIFIED IN TWO LYMPH NODES (0/2) - SEE ONCOLOGY TABLE AND COMMENT BELOW 2. Lymph node, sentinel, biopsy, Left Axillary - NO CARCINOMA IDENTIFIED IN ONE LYMPH NODE (0/1) 3. Lymph node, sentinel, biopsy - NO CARCINOMA IDENTIFIED IN ONE LYMPH NODE (0/1) 4. Lymph node, sentinel, biopsy - NO CARCINOMA IDENTIFIED IN ONE LYMPH NODE (0/1) 5. Lymph node, sentinel, biopsy - NO CARCINOMA IDENTIFIED IN ONE LYMPH NODE (0/1) Microscopic Comment 1. BREAST, INVASIVE TUMOR Procedure: Simple mastectomy Laterality: Left Tumor Size: 5. 3 cm, 3.5 cm Histologic Type: Invasive carcinoma of no special type (ductal, not otherwise specified) Grade: Nottingham Grade 3 Tubular Differentiation: 3 Nuclear Pleomorphism: 3 Mitotic Count: 2 Ductal Carcinoma in Situ (DCIS): Present Extent of Tumor: Skin: Invasive carcinoma directly invades into the dermis or epidermis without skin ulceration Margins: Invasive carcinoma, distance from closest margin: 1.5 cm (posterior) DCIS, distance from closest margin: > 1 cm Regional Lymph Nodes: Number of Lymph Nodes Examined: 6 Number of Sentinel Lymph Nodes Examined: 4 Lymph Nodes with Macrometastases: 0 Lymph Nodes with Micrometastases: 0 Lymph Nodes with Isolated Tumor Cells: 0 Treatment effect: No known presurgical therapy Breast  Prognostic Profile: See Also ([id number]) Estrogen Receptor: Positive (100%, strong); Positive (100%, strong) Progesterone Receptor: Positive (50%, strong); Positive (50%, strong) Her2: Positive (Ratio 2.80); Negative (Ratio  1.28) Ki-67: 70%; 60% Best tumor block for sendout testing: 1E Pathologic Stage Classification (pTNM, AJCC 8th Edition): Primary Tumor: mpT3 Regional Lymph Nodes: pN0 COMMENT: The two invasive carcinomas in the breast have slightly different morphologies. The larger lesion has a papillary and micropapillary pattern admixed with typical ductal carcinoma while the smaller lesion is more typical of a ductal lesion with linear arrays (E-cadherin performed on biopsy). There are foci within the larger lesion that are concerning for lymphovascular space invasion.    04/07/2017 - 04/27/2018 Chemotherapy   weekly taxol and herceptin x12 weeks, 04/07/17-06/23/17. Then herceptin q3 weeks for total 1 year which was switched to [place] on 07/14/17. Stopped after 2 doses due to worsening neuropathy. Switched back to maintenance Herceptin started 8/12019. Completed on 04/27/18.    07/20/2017 - 09/03/2017 Radiation Therapy   She started adjuvant radiation on 07/20/17 with Dr. Lisbeth Renshaw and plans to complete on 09/03/17.    10/2017 -  Anti-estrogen oral therapy   Anastrozole 67m daily starting 10/2017      CURRENT THERAPY:  Anastrozole 157mdaily starting 10/2017  INTERVAL HISTORY:  MaGIZZELLE LACOMBs here for a follow up of left breast cancer. She was last seen by me 6 months ago. She presents to the clinic alone. She notes she is doing well. She notes her neuropathy with numbness of her fingers and tingling spasms in her fingers has not improved. She does ambulate well with cane and even walks a couple miles a day. She notes she recently started Fosamax weekly. She notes she sees her PCP every 4 months. I reviewed her medication list with her.  She notes she feels an impact of her speech  where she knows what she wants to say but does not come out. She notes she will also often forget things easily.  She feels she is still able to take care of herself well. She no longer has home aid given her improved ambulation.     REVIEW OF SYSTEMS:   Constitutional: Denies fevers, chills or abnormal weight loss Eyes: Denies blurriness of vision Ears, nose, mouth, throat, and face: Denies mucositis or sore throat Respiratory: Denies cough, dyspnea or wheezes Cardiovascular: Denies palpitation, chest discomfort or lower extremity swelling Gastrointestinal:  Denies nausea, heartburn or change in bowel habits Skin: Denies abnormal skin rashes MSK: (+)  Arthritis with sharp spasms of fingers Lymphatics: Denies new lymphadenopathy or easy bruising Neurological: (+) Neuropathy of hands  Behavioral/Psych: Mood is stable, no new changes  All other systems were reviewed with the patient and are negative.  MEDICAL HISTORY:  Past Medical History:  Diagnosis Date  . Arthritis    "maybe a little in my left knee" (02/23/2017)  . Asthmatic bronchitis   . Cancer of left breast ([facility]  . Coronary atherosclerosis    Minor nonobstructive CAD at cardiac catheterization 2009  . Dyslipidemia   . Femoral bruit 7/08   With normal ABIs  . Hypertension   . Iliac artery aneurysm ([facility]  . Lower extremity edema    Chronic  . Pneumonia ~ 1950   in 8th grade  . Type II diabetes mellitus ([facility]    SURGICAL HISTORY: Past Surgical History:  Procedure Laterality Date  . APPENDECTOMY    . BREAST BIOPSY Left 02/2017  . CARDIAC CATHETERIZATION     "long time ago" (02/23/2017)  . MASTECTOMY COMPLETE / SIMPLE W/ SENTINEL NODE BIOPSY Left 02/23/2017   TOTAL MATECTOMY;  LEFT AXILLARY SENTINEL LYMPH NODE  BIOPSY ERAS PATHWAY  . MASTECTOMY WITH RADIOACTIVE SEED GUIDED EXCISION AND AXILLARY SENTINEL LYMPH NODE BIOPSY Left 02/23/2017   Procedure: LEFT TOTAL MATECTOMY;  LEFT AXILLARY SENTINEL LYMPH NODE BIOPSY  ERAS PATHWAY;  Surgeon: Excell Seltzer, MD;  Location: [facility];  Service: General;  Laterality: Left;  . PILONIDAL CYST EXCISION    . PORT-A-CATH REMOVAL Right 09/27/2018   Procedure: REMOVAL PORT-A-CATH;  Surgeon: Fanny Skates, MD;  Location: [facility];  Service: General;  Laterality: Right;  . PORTA CATH INSERTION  02/23/2017  . PORTACATH PLACEMENT N/A 02/23/2017   Procedure: INSERTION PORT-A-CATH;  Surgeon: Excell Seltzer, MD;  Location: [facility];  Service: General;  Laterality: N/A;  . SHOULDER ARTHROSCOPY W/ ROTATOR CUFF REPAIR Right     I have reviewed the social history and family history with the patient and they are unchanged from previous note.  ALLERGIES:  has No Known Allergies.  MEDICATIONS:  Current Outpatient Medications  Medication Sig Dispense Refill  . albuterol (VENTOLIN HFA) 108 (90 Base) MCG/ACT inhaler Inhale into the lungs every 6 (six) hours as needed for wheezing or shortness of breath.    Marland Kitchen alendronate (FOSAMAX) 70 MG tablet Take 70 mg by mouth once a week.    Marland Kitchen amLODipine (NORVASC) 5 MG tablet Take 1 tablet (5 mg total) by mouth daily. 90 tablet 3  . anastrozole (ARIMIDEX) 1 MG tablet Take 1 tablet (1 mg total) by mouth daily. 90 tablet 3  . aspirin 81 MG EC tablet Take 81 mg by mouth at bedtime.     . Biotin (BIOTIN 5000) 5 MG CAPS Take 1 capsule by mouth daily.    . chlorthalidone (HYGROTON) 25 MG tablet Take 25 mg by mouth daily.     . Coenzyme Q10 (COQ10) 100 MG CAPS Take 100 mg by mouth daily.      Marland Kitchen losartan (COZAAR) 50 MG tablet Take 1 tablet (50 mg total) by mouth 2 (two) times daily. 180 tablet 3  . metFORMIN (GLUCOPHAGE) 500 MG tablet Take 500 mg by mouth 2 (two) times daily with a meal.     . pregabalin (LYRICA) 75 MG capsule TAKE 1 CAPSULE BY MOUTH TWICE A DAY FOR 7 DAYS- THEN INCREASE TO 2 CAPSULES TWICE A DAY 120 capsule 1  . rosuvastatin (CRESTOR) 5 MG tablet Take 5 mg by mouth every other day.    . vitamin B-12  (CYANOCOBALAMIN) 1000 MCG tablet Take 1,000 mcg by mouth daily.    . Vitamin D, Cholecalciferol, 25 MCG (1000 UT) CAPS Take 1 capsule by mouth daily.      No current facility-administered medications for this visit.    PHYSICAL EXAMINATION: ECOG PERFORMANCE STATUS: 1 - Symptomatic but completely ambulatory  Vitals:   04/05/19 1050  BP: (!) 147/82  Pulse: 69  Resp: 17  Temp: 98 F (36.7 C)  SpO2: 100%   Filed Weights   04/05/19 1050  Weight: 178 lb 12.8 oz (81.1 kg)    GENERAL:alert, no distress and comfortable SKIN: skin color, texture, turgor are normal, no rashes or significant lesions EYES: normal, Conjunctiva are pink and non-injected, sclera clear  NECK: supple, thyroid normal size, non-tender, without nodularity LYMPH:  no palpable lymphadenopathy in the cervical, axillary  LUNGS: clear to auscultation and percussion with normal breathing effort HEART: regular rate & rhythm and no murmurs and no lower extremity edema ABDOMEN:abdomen soft, non-tender and normal bowel sounds Musculoskeletal:no cyanosis of digits and no clubbing  NEURO: alert & oriented x 3  with fluent speech, no focal motor/sensory deficits BREAST: S/p left mastectomy with breast surgically absent: Surgical incision healed well. (+) 4x4cm palpable lump at 12:00 position of right breast, 4 cm from nipple, mass vs normal breast tissue. No palpable adenopathy bilaterally   LABORATORY DATA:  I have reviewed the data as listed CBC Latest Ref Rng & Units 04/05/2019 09/16/2018 04/21/2018  WBC 4.0 - 10.5 K/uL 3.1(L) 4.1 3.6(L)  Hemoglobin 12.0 - 15.0 g/dL 12.7 12.6 11.4(L)  Hematocrit 36.0 - 46.0 % 39.1 38.9 36.0  Platelets 150 - 400 K/uL 254 228 227     CMP Latest Ref Rng & Units 04/05/2019 09/16/2018 04/21/2018  Glucose 70 - 99 mg/dL 175(H) 85 170(H)  BUN 8 - 23 mg/dL 7(L) 15 15  Creatinine 0.44 - 1.00 mg/dL 0.79 0.83 0.81  Sodium 135 - 145 mmol/L 131(L) 133(L) 133(L)  Potassium 3.5 - 5.1 mmol/L 4.2 3.9 4.2   Chloride 98 - 111 mmol/L 95(L) 97(L) 98  CO2 22 - 32 mmol/L 28 27 26   Calcium 8.9 - 10.3 mg/dL 9.4 9.7 9.1  Total Protein 6.5 - 8.1 g/dL 7.1 7.1 6.9  Total Bilirubin 0.3 - 1.2 mg/dL 0.6 0.8 0.6  Alkaline Phos 38 - 126 U/L 78 71 79  AST 15 - 41 U/L 18 19 20   ALT 0 - 44 U/L 16 14 19       RADIOGRAPHIC STUDIES: I have personally reviewed the radiological images as listed and agreed with the findings in the report. No results found.   ASSESSMENT & PLAN:  FENDI MEINHARDT is a 86 y.o. female with    1.Multifocal invasive ductal carcinoma of the left breast of female;carcinoma of the upper-outer quadrant of left breast and female, ER/PR positive, HER-2 negative pT2N0M0, G2, Stage 1B; IDC of the upper-inner quadrant left breast, ER+/PR+/HER2+, pT3N0M0 G3, stage IB -Diagnosed in 01/2017. Treated withleft mastectomy first, followed by adjuvantchemo and adjuvant radiation. She developed a severe peripheral neuropathy from adjuvant Taxol and stopped earlier. She did try TDM1,and stopped due to worsening neuropathy. She completed her 1 year treatment with maintenanceHerceptin.  -She started antiestrogen therapy with anastrozole in 10/2017. Tolerating well overall. Will continue for 5 years -She had her PAC removed on 09/27/18.  -She is clinically doing well. Lab reviewed, her CBC and CMP are within normal limits except WBC 3.1, ANC 1.5, Na 131, BG 175. Her 02/2019 mammogram were unremarkable although her Physical exam today shows a 4cm lump of right breast. I will order Breast US to further evaluate. This may benign breast tissue. She is agreeable.  -Continue Surveillance. She will continue yearly right mammograms, next in 02/2020 -Continue Anastrozole  -F/u in 6 months    2. Peripheral neuropathy, G2, secondary to taxol, DM and T-DM1 -Follow-up with Dr. Mickeal Skinner -Multiple neuropathic pain drugs have not improved her sx, including Gabapentin, Amytryptilline, Pregabalin, and Lidocaine. She  previously did PT  -The numbness in her fingers and tingling in her feet are still moderate, but manageable so far. Has not improved. Stable.  -She has Tramadol but does not use it now. She will continue Lyrica which controls her pain. She ambulates well with Cane and walks 1-2 miles daily.    3. HTN, CAD, DM -f/u with PCP  4. Osteoporosis -07/2018 DEXA shows osteoporosis with lowest T-score -2.6 at left forearm radius.  -We discussed AI can weaken her bone further.  -She recently started Fosamax weekly through her PCP. Continue along with Calcium and Vit d.  -I also  encouraged her to avoid falls given her risk for fracture and continue staying active with walking.    5. Memory loss  -She has developed mild expressive aphasia and memory loss lately  -She feels this is increasing lately. She notes longer has home aid and only needs help getting out of tub otherwise she is able to take care of herself at home.  -I recommend she f/u wit Dr. Mickeal Skinner for evaluation. She is agreeable.    Plan -Right breast US in 1-2 weeks for a possible right breast mass -Continue anastrozole, refilled today  -send message to Dr. Mickeal Skinner for her f/u  -Lab and F/u in 6 months. She rather be seen in person when she can.    No problem-specific Assessment & Plan notes found for this encounter.   Orders Placed This Encounter  Procedures  . US BREAST LTD UNI RIGHT INC AXILLA    Medicare Off protocol doctor Pf: 02/28/19@[facility] /  No needs/  No implants Epic order     Standing Status:   Future    Standing Expiration Date:   06/04/2020    Order Specific Question:   Reason for Exam (SYMPTOM  OR DIAGNOSIS REQUIRED)    Answer:   right breast mass, 4cm at 12:00 position, 4cm from nipple, rule out malignancy, negative on recent mammogram    Order Specific Question:   Preferred imaging location?    Answer:   [facility]   All questions were answered. The patient knows to call the clinic with any problems,  questions or concerns. No barriers to learning was detected. The total time spent in the appointment was 30 minutes.     Truitt Merle, MD 04/05/2019   I, Joslyn Devon, am acting as scribe for Truitt Merle, MD.   I have reviewed the above documentation for accuracy and completeness, and I agree with the above.

## 2019-04-04 ENCOUNTER — Other Ambulatory Visit: Payer: Self-pay

## 2019-04-04 ENCOUNTER — Encounter: Payer: Self-pay | Admitting: Cardiovascular Disease

## 2019-04-04 ENCOUNTER — Ambulatory Visit (INDEPENDENT_AMBULATORY_CARE_PROVIDER_SITE_OTHER): Payer: Medicare Other | Admitting: Cardiovascular Disease

## 2019-04-04 VITALS — BP 176/73 | HR 70 | Ht 64.0 in | Wt 180.2 lb

## 2019-04-04 DIAGNOSIS — I25118 Atherosclerotic heart disease of native coronary artery with other forms of angina pectoris: Secondary | ICD-10-CM | POA: Diagnosis not present

## 2019-04-04 DIAGNOSIS — R0609 Other forms of dyspnea: Secondary | ICD-10-CM

## 2019-04-04 DIAGNOSIS — I519 Heart disease, unspecified: Secondary | ICD-10-CM | POA: Diagnosis not present

## 2019-04-04 DIAGNOSIS — R0600 Dyspnea, unspecified: Secondary | ICD-10-CM | POA: Diagnosis not present

## 2019-04-04 DIAGNOSIS — Z170 Estrogen receptor positive status [ER+]: Secondary | ICD-10-CM | POA: Diagnosis not present

## 2019-04-04 DIAGNOSIS — C50412 Malignant neoplasm of upper-outer quadrant of left female breast: Secondary | ICD-10-CM

## 2019-04-04 DIAGNOSIS — I10 Essential (primary) hypertension: Secondary | ICD-10-CM

## 2019-04-04 MED ORDER — AMLODIPINE BESYLATE 5 MG PO TABS: 5.0000 mg | ORAL_TABLET | Freq: Every day | ORAL | 3 refills | Status: AC

## 2019-04-04 NOTE — Patient Instructions (Addendum)
Medication Instructions:   Resume Amlodipine 5mg  daily - printed script given today.   Continue all other medications.    Labwork: none  Testing/Procedures:  Your physician has requested that you have an echocardiogram. Echocardiography is a painless test that uses sound waves to create images of your heart. It provides your doctor with information about the size and shape of your heart and how well your heart's chambers and valves are working. This procedure takes approximately one hour. There are no restrictions for this procedure.  Office will contact with results via phone or letter.    Follow-Up: 3 months   Any Other Special Instructions Will Be Listed Below (If Applicable). After being on the Amlodipine x 1 week, start checking blood pressure 2-3 x week till next visit.   If you need a refill on your cardiac medications before your next appointment, please call your pharmacy.

## 2019-04-04 NOTE — Progress Notes (Signed)
SUBJECTIVE: The patient presents for annual follow-up. She has nonobstructive coronary artery disease, hypertension, and diastolic dysfunction.  She also has left-sided breast cancer and underwent left total mastectomy in January 2019 and was treated with Taxol and Herceptin.  She also underwent radiation therapy.    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 Shehadalsobeentaking an [place] class and amath class.  She underwent a normal nuclear stress test on 08/09/15 and demonstrated a hypertensive response to exercise.  Echocardiogram on 07/08/2018 demonstrated normal LV systolic function, EF [id number], grade 1 diastolic dysfunction.  She has been experiencing exertional dyspnea over the past 2 months.  She had been on amlodipine before but she is no longer taking this.  Her blood pressure significantly elevated today.  She denies exertional chest pain.  She also denies leg swelling.  She has had some neuropathy in both hands.  ECG performed today which I personally view demonstrates sinus rhythm with right bundle branch block.   Review of Systems: As per "subjective", otherwise negative.  No Known Allergies  Current Outpatient Medications  Medication Sig Dispense Refill  . albuterol (VENTOLIN HFA) 108 (90 Base) MCG/ACT inhaler Inhale into the lungs every 6 (six) hours as needed for wheezing or shortness of breath.    Marland Kitchen alendronate (FOSAMAX) 70 MG tablet Take 70 mg by mouth once a week.    Marland Kitchen anastrozole (ARIMIDEX) 1 MG tablet TAKE 1 TABLET BY MOUTH EVERY DAY 90 tablet 1  . aspirin 81 MG EC tablet Take 81 mg by mouth at bedtime.     . Biotin (BIOTIN 5000) 5 MG CAPS Take 1 capsule by mouth daily.    . chlorthalidone (HYGROTON) 25 MG tablet Take 25 mg by mouth daily.     . Coenzyme Q10 (COQ10) 100 MG CAPS Take 100 mg by mouth daily.      Marland Kitchen losartan (COZAAR) 50 MG tablet Take 1 tablet (50 mg total) by mouth 2  (two) times daily. 180 tablet 3  . metFORMIN (GLUCOPHAGE) 500 MG tablet Take 500 mg by mouth 2 (two) times daily with a meal.     . pregabalin (LYRICA) 75 MG capsule TAKE 1 CAPSULE BY MOUTH TWICE A DAY FOR 7 DAYS- THEN INCREASE TO 2 CAPSULES TWICE A DAY 120 capsule 1  . rosuvastatin (CRESTOR) 5 MG tablet Take 5 mg by mouth every other day.    . vitamin B-12 (CYANOCOBALAMIN) 1000 MCG tablet Take 1,000 mcg by mouth daily.    . Vitamin D, Cholecalciferol, 25 MCG (1000 UT) CAPS Take 1 capsule by mouth daily.      No current facility-administered medications for this visit.    Past Medical History:  Diagnosis Date  . Arthritis    "maybe a little in my left knee" (02/23/2017)  . Asthmatic bronchitis   . Cancer of left breast ([facility]) 2019  . Coronary atherosclerosis    Minor nonobstructive CAD at cardiac catheterization 2009  . Dyslipidemia   . Femoral bruit 7/08   With normal ABIs  . Hypertension   . Iliac artery aneurysm ([facility] [place])   . Lower extremity edema    Chronic  . Pneumonia ~ 1950   in 8th grade  . Type II diabetes mellitus ([place])     Past Surgical History:  Procedure Laterality Date  . APPENDECTOMY    . BREAST BIOPSY Left 02/2017  . CARDIAC CATHETERIZATION     "long time ago" (02/23/2017)  .  MASTECTOMY COMPLETE / SIMPLE W/ SENTINEL NODE BIOPSY Left 02/23/2017   TOTAL MATECTOMY;  LEFT AXILLARY SENTINEL LYMPH NODE BIOPSY ERAS PATHWAY  . MASTECTOMY WITH RADIOACTIVE SEED GUIDED EXCISION AND AXILLARY SENTINEL LYMPH NODE BIOPSY Left 02/23/2017   Procedure: LEFT TOTAL MATECTOMY;  LEFT AXILLARY SENTINEL LYMPH NODE BIOPSY ERAS PATHWAY;  Surgeon: Excell Seltzer, MD;  Location: [facility];  Service: General;  Laterality: Left;  . PILONIDAL CYST EXCISION    . PORT-A-CATH REMOVAL Right 09/27/2018   Procedure: REMOVAL PORT-A-CATH;  Surgeon: Fanny Skates, MD;  Location: [facility];  Service: General;  Laterality: Right;  . PORTA CATH INSERTION  02/23/2017  . PORTACATH PLACEMENT  N/A 02/23/2017   Procedure: INSERTION PORT-A-CATH;  Surgeon: Excell Seltzer, MD;  Location: [facility];  Service: General;  Laterality: N/A;  . SHOULDER ARTHROSCOPY W/ ROTATOR CUFF REPAIR Right     Social History   Socioeconomic History  . Marital status: Married    Spouse name: Not on file  . Number of children: 2  . Years of education: Not on file  . Highest education level: Not on file  Occupational History  . Occupation: Counselling psychologist for [employer]    Comment: only works during tax season but not currently   . Occupation: in Dentist   . Occupation: retired Mining engineer of education     Comment: 1997  Tobacco Use  . Smoking status: Former Smoker    Packs/day: 2.00    Years: 46.00    Pack years: 92.00    Types: Cigarettes    Start date: 11/09/1953    Quit date: 2000    Years since quitting: 21.1  . Smokeless tobacco: Never Used  Substance and Sexual Activity  . Alcohol use: Yes    Alcohol/week: 2.0 standard drinks    Types: 2 Glasses of wine per week    Comment: social  . Drug use: No  . Sexual activity: Not on file  Other Topics Concern  . Not on file  Social History Narrative   Lives in [place] with her husband.   Has 2 grown children.   10-25-17 Unable to ask abuse questions husband with her today.   Social Determinants of Health   Financial Resource Strain:   . Difficulty of Paying Living Expenses: Not on file  Food Insecurity:   . Worried About Charity fundraiser in the Last Year: Not on file  . Ran Out of Food in the Last Year: Not on file  Transportation Needs:   . Lack of Transportation (Medical): Not on file  . Lack of Transportation (Non-Medical): Not on file  Physical Activity:   . Days of Exercise per Week: Not on file  . Minutes of Exercise per Session: Not on file  Stress:   . Feeling of Stress : Not on file  Social Connections:   . Frequency of Communication with Friends and Family: Not on file  . Frequency of Social Gatherings with Friends and Family:  Not on file  . Attends Religious Services: Not on file  . Active Member of Clubs or Organizations: Not on file  . Attends Archivist Meetings: Not on file  . Marital Status: Not on file  Intimate Partner Violence:   . Fear of Current or Ex-Partner: Not on file  . Emotionally Abused: Not on file  . Physically Abused: Not on file  . Sexually Abused: Not on file    Orson Slick, LPN was present throughout the entirety of  the encounter.  Vitals:   04/04/19 1517  BP: (!) 176/73  Pulse: 70  SpO2: 97%  Weight: 180 lb 3.2 oz (81.7 kg)  Height: 5\' 4"  (1.626 m)    Wt Readings from Last 3 Encounters:  04/04/19 180 lb 3.2 oz (81.7 kg)  03/17/19 175 lb (79.4 kg)  09/27/18 180 lb 12.4 oz (82 kg)     PHYSICAL EXAM General: NAD HEENT: Normal. Neck: No JVD, no thyromegaly. Lungs: Clear to auscultation bilaterally with normal respiratory effort. CV: Regular rate and rhythm, normal S1/S2, no S3/S4, no murmur. No pretibial or periankle edema.  No carotid bruit.   Abdomen: Soft, nontender, no distention.  Neurologic: Alert and oriented.  Psych: Normal affect. Skin: Normal. Musculoskeletal: No gross deformities.      Labs: Lab Results  Component Value Date/Time   K 3.9 09/16/2018 01:13 PM   BUN 15 09/16/2018 01:13 PM   CREATININE 0.83 09/16/2018 01:13 PM   ALT 14 09/16/2018 01:13 PM   HGB 12.6 09/16/2018 01:13 PM     Lipids: No results found for: LDLCALC, LDLDIRECT, CHOL, TRIG, HDL     ASSESSMENT AND PLAN:  1. Nonobstructive CAD: She has had some exertional dyspnea. Normal nuclear stress test on 08/09/15 with a hypertensive response to exercise.Currently on aspirin and rosuvastatin.  If echocardiogram is unremarkable, I would consider coronary CT angiography.  2. Essential [id number] pressure is significantly elevated.  I will resume amlodipine 5 mg daily.  I have asked her to check her blood pressure log 3 times per week after having been on amlodipine for at  least a week and to check this over the next several weeks.  3. Diastolic dysfunction/dyspnea on exertion: No evidence of volume overload.Blood pressure is elevated.   Grade 1 diastolic dysfunction by echocardiogram in June 2020.  Given her exertional dyspnea, I will obtain a follow-up echocardiogram.  4. Left-sided breast cancer: Status post treatment with Taxol and Herceptin and radiation therapy.  Cardiac function will need continued monitoring.   LVEF 55 percent by echocardiogram in June 2020.  I will obtain a follow-up echocardiogram.   Disposition: Follow up 3 months virtual visit   Kate Sable, M.D., F.A.C.C.

## 2019-04-05 ENCOUNTER — Inpatient Hospital Stay: Payer: Medicare Other | Attending: Hematology

## 2019-04-05 ENCOUNTER — Inpatient Hospital Stay (HOSPITAL_BASED_OUTPATIENT_CLINIC_OR_DEPARTMENT_OTHER): Payer: Medicare Other | Admitting: Hematology

## 2019-04-05 ENCOUNTER — Encounter: Payer: Self-pay | Admitting: Hematology

## 2019-04-05 ENCOUNTER — Other Ambulatory Visit: Payer: Self-pay

## 2019-04-05 VITALS — BP 147/82 | HR 69 | Temp 98.0°F | Resp 17 | Ht 64.0 in | Wt 178.8 lb

## 2019-04-05 DIAGNOSIS — C50412 Malignant neoplasm of upper-outer quadrant of left female breast: Secondary | ICD-10-CM | POA: Insufficient documentation

## 2019-04-05 DIAGNOSIS — Z170 Estrogen receptor positive status [ER+]: Secondary | ICD-10-CM | POA: Diagnosis not present

## 2019-04-05 DIAGNOSIS — C50212 Malignant neoplasm of upper-inner quadrant of left female breast: Secondary | ICD-10-CM | POA: Insufficient documentation

## 2019-04-05 DIAGNOSIS — I10 Essential (primary) hypertension: Secondary | ICD-10-CM | POA: Insufficient documentation

## 2019-04-05 DIAGNOSIS — I2510 Atherosclerotic heart disease of native coronary artery without angina pectoris: Secondary | ICD-10-CM | POA: Insufficient documentation

## 2019-04-05 DIAGNOSIS — E119 Type 2 diabetes mellitus without complications: Secondary | ICD-10-CM | POA: Insufficient documentation

## 2019-04-05 DIAGNOSIS — I25118 Atherosclerotic heart disease of native coronary artery with other forms of angina pectoris: Secondary | ICD-10-CM

## 2019-04-05 DIAGNOSIS — R413 Other amnesia: Secondary | ICD-10-CM | POA: Insufficient documentation

## 2019-04-05 DIAGNOSIS — M810 Age-related osteoporosis without current pathological fracture: Secondary | ICD-10-CM | POA: Diagnosis not present

## 2019-04-05 DIAGNOSIS — G620 Drug-induced polyneuropathy: Secondary | ICD-10-CM | POA: Insufficient documentation

## 2019-04-05 DIAGNOSIS — Z79811 Long term (current) use of aromatase inhibitors: Secondary | ICD-10-CM | POA: Insufficient documentation

## 2019-04-05 LAB — CMP (CANCER CENTER ONLY)
ALT: 16 U/L (ref 0–44)
AST: 18 U/L (ref 15–41)
Albumin: 3.8 g/dL (ref 3.5–5.0)
Alkaline Phosphatase: 78 U/L (ref 38–126)
Anion gap: 8 (ref 5–15)
BUN: 7 mg/dL — ABNORMAL LOW (ref 8–23)
CO2: 28 mmol/L (ref 22–32)
Calcium: 9.4 mg/dL (ref 8.9–10.3)
Chloride: 95 mmol/L — ABNORMAL LOW (ref 98–111)
Creatinine: 0.79 mg/dL (ref 0.44–1.00)
GFR, Est AFR Am: >60 mL/min (ref >60)
GFR, Estimated: >60 mL/min (ref >60)
Glucose, Bld: 175 mg/dL — ABNORMAL HIGH (ref 70–99)
Potassium: 4.2 mmol/L (ref 3.5–5.1)
Sodium: 131 mmol/L — ABNORMAL LOW (ref 135–145)
Total Bilirubin: 0.6 mg/dL (ref 0.3–1.2)
Total Protein: 7.1 g/dL (ref 6.5–8.1)

## 2019-04-05 LAB — CBC WITH DIFFERENTIAL (CANCER CENTER ONLY)
Abs Immature Granulocytes: 0 10*3/uL (ref 0.00–0.07)
Basophils Absolute: 0 10*3/uL (ref 0.0–0.1)
Basophils Relative: 1 %
Eosinophils Absolute: 0.1 10*3/uL (ref 0.0–0.5)
Eosinophils Relative: 4 %
HCT: 39.1 % (ref 36.0–46.0)
Hemoglobin: 12.7 g/dL (ref 12.0–15.0)
Immature Granulocytes: 0 %
Lymphocytes Relative: 31 %
Lymphs Abs: 0.9 10*3/uL (ref 0.7–4.0)
MCH: 30 pg (ref 26.0–34.0)
MCHC: 32.5 g/dL (ref 30.0–36.0)
MCV: 92.4 fL (ref 80.0–100.0)
Monocytes Absolute: 0.5 10*3/uL (ref 0.1–1.0)
Monocytes Relative: 15 %
Neutro Abs: 1.5 10*3/uL — ABNORMAL LOW (ref 1.7–7.7)
Neutrophils Relative %: 49 %
Platelet Count: 254 10*3/uL (ref 150–400)
RBC: 4.23 MIL/uL (ref 3.87–5.11)
RDW: 12.4 % (ref 11.5–15.5)
WBC Count: 3.1 10*3/uL — ABNORMAL LOW (ref 4.0–10.5)
nRBC: 0 % (ref 0.0–0.2)

## 2019-04-05 MED ORDER — ANASTROZOLE 1 MG PO TABS: 1.0000 mg | ORAL_TABLET | Freq: Every day | ORAL | 3 refills | Status: DC

## 2019-04-06 ENCOUNTER — Telehealth: Payer: Self-pay | Admitting: Internal Medicine

## 2019-04-06 NOTE — Telephone Encounter (Signed)
Scheduled appt per 3/4 sch msg. Pt is aware of appt date and time.  

## 2019-04-10 ENCOUNTER — Telehealth: Payer: Self-pay | Admitting: *Deleted

## 2019-04-10 MED ORDER — PREGABALIN 150 MG PO CAPS: 150.0000 mg | ORAL_CAPSULE | Freq: Two times a day (BID) | ORAL | 3 refills | Status: DC

## 2019-04-10 NOTE — Telephone Encounter (Signed)
Patient called requesting Rx Lyrica to be filled to [facility] [address].  She states that it is effective and wants to see if he will CHANGE order to reflect Lyrica 150 mg twice daily instead of Lyrica 75 mg (take two tablets twice daily) to decrease amount of tablets she has to take plus she wants a 90 day supply.  Routed to MD

## 2019-04-10 NOTE — Addendum Note (Signed)
Addended by: Ventura Sellers on: 04/10/2019 11:33 AM   Modules accepted: Orders

## 2019-04-13 ENCOUNTER — Other Ambulatory Visit: Payer: Self-pay

## 2019-04-13 ENCOUNTER — Inpatient Hospital Stay (HOSPITAL_BASED_OUTPATIENT_CLINIC_OR_DEPARTMENT_OTHER): Payer: Medicare Other | Admitting: Internal Medicine

## 2019-04-13 VITALS — BP 141/72 | HR 68 | Temp 98.0°F | Resp 17 | Ht 64.0 in | Wt 170.7 lb

## 2019-04-13 DIAGNOSIS — T451X5A Adverse effect of antineoplastic and immunosuppressive drugs, initial encounter: Secondary | ICD-10-CM | POA: Diagnosis not present

## 2019-04-13 DIAGNOSIS — C50412 Malignant neoplasm of upper-outer quadrant of left female breast: Secondary | ICD-10-CM | POA: Diagnosis not present

## 2019-04-13 DIAGNOSIS — G620 Drug-induced polyneuropathy: Secondary | ICD-10-CM

## 2019-04-13 DIAGNOSIS — Z79811 Long term (current) use of aromatase inhibitors: Secondary | ICD-10-CM | POA: Diagnosis not present

## 2019-04-13 DIAGNOSIS — I10 Essential (primary) hypertension: Secondary | ICD-10-CM | POA: Diagnosis not present

## 2019-04-13 DIAGNOSIS — Z170 Estrogen receptor positive status [ER+]: Secondary | ICD-10-CM | POA: Diagnosis not present

## 2019-04-13 DIAGNOSIS — C50212 Malignant neoplasm of upper-inner quadrant of left female breast: Secondary | ICD-10-CM | POA: Diagnosis not present

## 2019-04-13 NOTE — Progress Notes (Signed)
[facility] [place], [place] [address] [phone]   Interval Evaluation  Date of Service: 04/13/19 Patient Name: Penny Huynh Patient MRN: [id number] Patient DOB: August 24, 1933 Provider: Ventura Sellers, MD  Identifying Statement:  Penny Huynh is a 86 y.o. female with Chemotherapy-induced peripheral neuropathy ([place]) [G62.0, T45.1X5A]   Oncologic History: Oncology History Overview Note  Cancer Staging Carcinoma of upper-inner quadrant of left breast in female, estrogen receptor positive ([place]) Staging form: Breast, AJCC 8th Edition - Clinical stage from 01/18/2017: Stage IB (cT2, cN0, cM0, G3, ER: Positive, PR: Positive, HER2: Positive) - Signed by Truitt Merle, MD on 02/10/2017 - Pathologic stage from 02/23/2017: Stage IB (pT3, pN0, cM0, G3, ER: Positive, PR: Positive, HER2: Positive) - Signed by Alla Feeling, NP on 03/16/2017  Carcinoma of upper-outer quadrant of left breast in female, estrogen receptor positive ([place]) Staging form: Breast, AJCC 8th Edition - Clinical stage from 01/18/2017: Stage IB (cT2, cN0, cM0, G2, ER: Positive, PR: Positive, HER2: Negative) - Unsigned - Pathologic stage from 02/23/2017: Stage IB (pT2, pN0(sn), cM0, G3, ER: Positive, PR: Positive, HER2: Negative) - Signed by Alla Feeling, NP on 03/16/2017     Carcinoma of upper-outer quadrant of left breast in female, estrogen receptor positive ([place])  01/13/2017 Mammogram   FINDINGS: In the left breast, a spiculated mass lies in the upper outer quadrant. In the medial posterior left breast, there is a larger lobulated mass. These masses correspond to the palpable abnormalities. No other discrete left breast masses.  In the right breast, there are no discrete masses. There are no areas of architectural distortion.  In both breasts there are scattered calcifications, rounded punctate, with a few other larger coarse dystrophic  calcifications, without significant change from the previous screening mammogram, which is dated 12/04/2009.  Mammographic images were processed with CAD.  On physical exam, there is a firm palpable mass in the upper outer left breast and another firm mass in the medial left breast.    01/13/2017 Breast US   Targeted ultrasound is performed, showing an irregular hypoechoic shadowing mass in the left breast at the 2:30 o'clock position, 5 cm the nipple, measuring 3.3 x 2.8 x 2.6 cm. In the 10:30 o'clock position of the left breast, 5 cm the nipple, there is irregular hypoechoic mass with somewhat more lobulated margins, corresponding to the lobulated mass seen mammographically. This measures 4.8 x 2.9 x 4.8 cm. Both masses show internal vascularity on color Doppler analysis.  Sonographic evaluation of the left axilla shows several nodes with thickened cortices. Status cortex measured is 5 mm. None of these lymph nodes appear enlarged but overall size criteria.  IMPRESSION: 1. Two masses in the left breast, 1 at the 2:30 o'clock position in the other at the 10:30 o'clock position, both highly suspicious for breast carcinoma. 2. Several abnormal left axillary lymph nodes with thickened cortices. 3. No evidence of right breast malignancy.    01/18/2017 Pathology Results   Diagnosis 1. Breast, left, needle core biopsy, 2:30 o'clock - INVASIVE MAMMARY CARCINOMA - MAMMARY CARCINOMA IN-SITU - SEE COMMENT 2. Lymph node, needle/core biopsy, left axilla - NO CARCINOMA IDENTIFIED - SEE COMMENT 3. Breast, left, needle core biopsy, 10:30 o'clock - INVASIVE DUCTAL CARCINOMA - SEE COMMENT  1. PROGNOSTIC INDICATORS Results: IMMUNOHISTOCHEMICAL AND MORPHOMETRIC ANALYSIS PERFORMED MANUALLY Estrogen Receptor: 100%, POSITIVE, STRONG STAINING INTENSITY Progesterone Receptor: 60%, POSITIVE, STRONG STAINING INTENSITY Proliferation Marker Ki67: 60%  1. FLUORESCENCE IN-SITU  HYBRIDIZATION Results: HER2 - NEGATIVE RATIO OF HER2/CEP17 SIGNALS 1.28 AVERAGE HER2 COPY NUMBER PER CELL 2.75  3. PROGNOSTIC INDICATORS Results: IMMUNOHISTOCHEMICAL AND MORPHOMETRIC ANALYSIS PERFORMED MANUALLY Estrogen Receptor: 100%, POSITIVE, STRONG STAINING INTENSITY Progesterone Receptor: 50%, POSITIVE, STRONG STAINING INTENSITY Proliferation Marker Ki67: 70%  3. FLUORESCENCE IN-SITU HYBRIDIZATION Results: HER2 - **POSITIVE** RATIO OF HER2/CEP17 SIGNALS 2.80 AVERAGE HER2 COPY NUMBER PER CELL 6.15  Microscopic Comment 1. The biopsy material shows an infiltrative proliferation of cells with large vesicular nuclei with inconspicuous nucleoli, arranged linearly and in small clusters. Based on the biopsy, the carcinoma appears Nottingham grade 2 of 3 and measures 0.9 cm in greatest linear extent. E-cadherin and prognostic markers (ER/PR/ki-67/HER2-FISH)are pending and will be reported in an addendum. Dr. Lyndon Code reviewed the case and agrees with the above diagnosis.   02/10/2017 Initial Diagnosis   Carcinoma of upper-outer quadrant of left breast in female, estrogen receptor positive ([place])   02/19/2017 Imaging   Bone Scan whole Body 02/19/17 IMPRESSION: Negative for evidence of bony metastatic disease.   02/19/2017 Imaging   CT CAP W Contrast 02/19/17 IMPRESSION: 1. Small right middle lobe pulmonary nodules up to 0.8 cm, nonspecific. Non-contrast chest CT at 3-6 months is recommended. If the nodules are stable at time of repeat CT, then future CT at 18-24 months (from today's scan) is considered optional for low-risk patients, but is recommended for high-risk patients. This recommendation follows the consensus statement: Guidelines for Management of Incidental Pulmonary Nodules Detected on CT Images: From the Fleischner Society 2017; Radiology 2017; 284:228-243. 2. Subcentimeter hepatic lesions likely cysts but incompletely characterized due to small size. Attention on follow-up  imaging recommended. 3. 2.1 cm fusiform right common iliac artery aneurysm. Continued surveillance recommended. 4. Aortic Atherosclerosis (ICD10-I70.0) and Emphysema (ICD10-J43.9).   02/23/2017 Pathology Results   Diagnosis 1. Breast, simple mastectomy, Left Total - INVASIVE DUCTAL CARCINOMA, MULTIFOCAL, NOTTINGHAM GRADE 3/3 (5.3 CM, 3.5 CM) - INVASIVE CARCINOMA INVOLVES THE DERMIS - DUCTAL CARCINOMA IN SITU, INTERMEDIATE GRADE - HYALINIZED FIBROADENOMA - MARGINS UNINVOLVED BY CARCINOMA - NO CARCINOMA IDENTIFIED IN TWO LYMPH NODES (0/2) - SEE ONCOLOGY TABLE AND COMMENT BELOW 2. Lymph node, sentinel, biopsy, Left Axillary - NO CARCINOMA IDENTIFIED IN ONE LYMPH NODE (0/1) 3. Lymph node, sentinel, biopsy - NO CARCINOMA IDENTIFIED IN ONE LYMPH NODE (0/1) 4. Lymph node, sentinel, biopsy - NO CARCINOMA IDENTIFIED IN ONE LYMPH NODE (0/1) 5. Lymph node, sentinel, biopsy - NO CARCINOMA IDENTIFIED IN ONE LYMPH NODE (0/1) Microscopic Comment 1. BREAST, INVASIVE TUMOR Procedure: Simple mastectomy Laterality: Left Tumor Size: 5. 3 cm, 3.5 cm Histologic Type: Invasive carcinoma of no special type (ductal, not otherwise specified) Grade: Nottingham Grade 3 Tubular Differentiation: 3 Nuclear Pleomorphism: 3 Mitotic Count: 2 Ductal Carcinoma in Situ (DCIS): Present Extent of Tumor: Skin: Invasive carcinoma directly invades into the dermis or epidermis without skin ulceration Margins: Invasive carcinoma, distance from closest margin: 1.5 cm (posterior) DCIS, distance from closest margin: > 1 cm Regional Lymph Nodes: Number of Lymph Nodes Examined: 6 Number of Sentinel Lymph Nodes Examined: 4 Lymph Nodes with Macrometastases: 0 Lymph Nodes with Micrometastases: 0 Lymph Nodes with Isolated Tumor Cells: 0 Treatment effect: No known presurgical therapy Breast Prognostic Profile: See Also ([id number]) Estrogen Receptor: Positive (100%, strong); Positive (100%, strong) Progesterone  Receptor: Positive (50%, strong); Positive (50%, strong) Her2: Positive (Ratio 2.80); Negative (Ratio 1.28) Ki-67: 70%; 60% Best tumor block for sendout testing: 1E Pathologic Stage Classification (pTNM, AJCC 8th Edition): Primary Tumor: mpT3 Regional Lymph Nodes: pN0 COMMENT:  The two invasive carcinomas in the breast have slightly different morphologies. The larger lesion has a papillary and micropapillary pattern admixed with typical ductal carcinoma while the smaller lesion is more typical of a ductal lesion with linear arrays (E-cadherin performed on biopsy). There are foci within the larger lesion that are concerning for lymphovascular space invasion.    04/07/2017 - 04/27/2018 Chemotherapy   weekly taxol and herceptin x12 weeks, 04/07/17-06/23/17. Then herceptin q3 weeks for total 1 year which was switched to [place] on 07/14/17. Stopped after 2 doses due to worsening neuropathy. Switched back to maintenance Herceptin started 8/12019. Completed on 04/27/18.    07/20/2017 - 09/03/2017 Radiation Therapy   She started adjuvant radiation on 07/20/17 with Dr. Lisbeth Renshaw and plans to complete on 09/03/17.    10/2017 -  Anti-estrogen oral therapy   Anastrozole 32m daily starting 10/2017     Interval History:  Penny FELKERpresents today for neuropathy follow up. She modest improvement in pain symptoms with higher dose of Lyrica.  Continues to be frustrated with dealing with constant discomfort in her feet and numbness in her hands.  Patient and Dr. FBurr Medicodescribe some increased difficulty with word finding at times, and some forgetfullness. Otherwise no new neurologic symptoms.  H+P: presents today to discuss her neuropathy symptoms.  She describes painful burning affecting her feet and lower to mid-shins symmetrically.  This started sometime after taxol chemotherapy was started in March (continued through May) and has persisted or even progressed.  At this time she also describes feeling of numbness  and tingling affecting her finger tips on both hands.  Her walking is impaired because of poor sensation and balance, she is requiring a walker fora ambulation.  There is also noted swelling of both feet.  She does acknowledge a milder version of these symptoms, affecting the bottoms of her feet only, which were present prior to the onset of chemotherapy.  She has long-standing diagnosis of diabetes.  Presently she is receiving Herceptin and no further cytotoxic chemotherapy.  Taking Gabapentin 6019m3x day without efficacy.  Medications: Current Outpatient Medications on File Prior to Visit  Medication Sig Dispense Refill  . albuterol (VENTOLIN HFA) 108 (90 Base) MCG/ACT inhaler Inhale into the lungs every 6 (six) hours as needed for wheezing or shortness of breath.    . Marland Kitchenlendronate (FOSAMAX) 70 MG tablet Take 70 mg by mouth once a week.    . Marland KitchenmLODipine (NORVASC) 5 MG tablet Take 1 tablet (5 mg total) by mouth daily. 90 tablet 3  . anastrozole (ARIMIDEX) 1 MG tablet Take 1 tablet (1 mg total) by mouth daily. 90 tablet 3  . aspirin 81 MG EC tablet Take 81 mg by mouth at bedtime.     . Biotin (BIOTIN 5000) 5 MG CAPS Take 1 capsule by mouth daily.    . chlorthalidone (HYGROTON) 25 MG tablet Take 25 mg by mouth daily.     . Coenzyme Q10 (COQ10) 100 MG CAPS Take 100 mg by mouth daily.      . Marland Kitchenosartan (COZAAR) 50 MG tablet Take 1 tablet (50 mg total) by mouth 2 (two) times daily. 180 tablet 3  . metFORMIN (GLUCOPHAGE) 500 MG tablet Take 500 mg by mouth 2 (two) times daily with a meal.     . pregabalin (LYRICA) 150 MG capsule Take 1 capsule (150 mg total) by mouth 2 (two) times daily. 60 capsule 3  . rosuvastatin (CRESTOR) 5 MG tablet Take 5 mg by mouth every  other day.    . vitamin B-12 (CYANOCOBALAMIN) 1000 MCG tablet Take 1,000 mcg by mouth daily.    . Vitamin D, Cholecalciferol, 25 MCG (1000 UT) CAPS Take 1 capsule by mouth daily.      No current facility-administered medications on file prior to  visit.    Allergies: No Known Allergies Past Medical History:  Past Medical History:  Diagnosis Date  . Arthritis    "maybe a little in my left knee" (02/23/2017)  . Asthmatic bronchitis   . Cancer of left breast ([place]) 2019  . Coronary atherosclerosis    Minor nonobstructive CAD at cardiac catheterization 2009  . Dyslipidemia   . Femoral bruit 7/08   With normal ABIs  . Hypertension   . Iliac artery aneurysm ([place])   . Lower extremity edema    Chronic  . Pneumonia ~ 1950   in 8th grade  . Type II diabetes mellitus ([place])    Past Surgical History:  Past Surgical History:  Procedure Laterality Date  . APPENDECTOMY    . BREAST BIOPSY Left 02/2017  . CARDIAC CATHETERIZATION     "long time ago" (02/23/2017)  . MASTECTOMY COMPLETE / SIMPLE W/ SENTINEL NODE BIOPSY Left 02/23/2017   TOTAL MATECTOMY;  LEFT AXILLARY SENTINEL LYMPH NODE BIOPSY ERAS PATHWAY  . MASTECTOMY WITH RADIOACTIVE SEED GUIDED EXCISION AND AXILLARY SENTINEL LYMPH NODE BIOPSY Left 02/23/2017   Procedure: LEFT TOTAL MATECTOMY;  LEFT AXILLARY SENTINEL LYMPH NODE BIOPSY ERAS PATHWAY;  Surgeon: Excell Seltzer, MD;  Location: [facility];  Service: General;  Laterality: Left;  . PILONIDAL CYST EXCISION    . PORT-A-CATH REMOVAL Right 09/27/2018   Procedure: REMOVAL PORT-A-CATH;  Surgeon: Fanny Skates, MD;  Location: [facility];  Service: General;  Laterality: Right;  . PORTA CATH INSERTION  02/23/2017  . PORTACATH PLACEMENT N/A 02/23/2017   Procedure: INSERTION PORT-A-CATH;  Surgeon: Excell Seltzer, MD;  Location: [facility];  Service: General;  Laterality: N/A;  . SHOULDER ARTHROSCOPY W/ ROTATOR CUFF REPAIR Right    Social History:  Social History   Socioeconomic History  . Marital status: Married    Spouse name: Not on file  . Number of children: 2  . Years of education: Not on file  . Highest education level: Not on file  Occupational History  . Occupation: Counselling psychologist for [employer]    Comment:  only works during tax season but not currently   . Occupation: in Dentist   . Occupation: retired Mining engineer of education     Comment: 1997  Tobacco Use  . Smoking status: Former Smoker    Packs/day: 2.00    Years: 46.00    Pack years: 92.00    Types: Cigarettes    Start date: 11/09/1953    Quit date: 2000    Years since quitting: 21.2  . Smokeless tobacco: Never Used  Substance and Sexual Activity  . Alcohol use: Yes    Alcohol/week: 2.0 standard drinks    Types: 2 Glasses of wine per week    Comment: social  . Drug use: No  . Sexual activity: Not on file  Other Topics Concern  . Not on file  Social History Narrative   Lives in [place] with her husband.   Has 2 grown children.   10-25-17 Unable to ask abuse questions husband with her today.   Social Determinants of Health   Financial Resource Strain:   . Difficulty of Paying Living Expenses:   Food Insecurity:   .  Worried About Charity fundraiser in the Last Year:   . Arboriculturist in the Last Year:   Transportation Needs:   . Film/video editor (Medical):   Marland Kitchen Lack of Transportation (Non-Medical):   Physical Activity:   . Days of Exercise per Week:   . Minutes of Exercise per Session:   Stress:   . Feeling of Stress :   Social Connections:   . Frequency of Communication with Friends and Family:   . Frequency of Social Gatherings with Friends and Family:   . Attends Religious Services:   . Active Member of Clubs or Organizations:   . Attends Archivist Meetings:   Marland Kitchen Marital Status:   Intimate Partner Violence:   . Fear of Current or Ex-Partner:   . Emotionally Abused:   Marland Kitchen Physically Abused:   . Sexually Abused:    Family History:  Family History  Problem Relation Age of Onset  . Diabetes Mother   . Stroke Father   . Cancer Father        unknown type     Review of Systems: Constitutional: Denies fevers, chills or abnormal weight loss Eyes: Denies blurriness of vision Ears, nose, mouth, throat,  and face: Denies mucositis or sore throat Respiratory: Denies cough, dyspnea or wheezes Cardiovascular: Denies palpitation, chest discomfort or lower extremity swelling Gastrointestinal:  Denies nausea, constipation, diarrhea GU: Denies dysuria or incontinence Skin: Denies abnormal skin rashes Neurological: Per HPI Musculoskeletal: +knee pain Behavioral/Psych: Denies anxiety, disturbance in thought content, and mood instability   Physical Exam: There were no vitals filed for this visit. KPS: 60. General: Alert, cooperative, pleasant, in no acute distress Head: Craniotomy scar noted, dry and intact. EENT: No conjunctival injection or scleral icterus. Oral mucosa moist Lungs: Resp effort normal Cardiac: Regular rate and rhythm Abdomen: Soft, non-distended abdomen Skin: No rashes cyanosis or petechiae. Extremities: No clubbing or edema  Neurologic Exam: Mental Status: Awake, alert, attentive to examiner. Oriented to self and environment. Language is fluent with intact comprehension.  Cranial Nerves: Visual acuity is grossly normal. Visual fields are full. Extra-ocular movements intact. No ptosis. Face is symmetric, tongue midline. Motor: Tone and bulk are normal. Power is full in both arms and legs. Reflexes are decreased bilaterally, no pathologic reflexes present. Intact finger to nose bilaterally Sensory: Impaired to light touch and temp lower legs and fingertips Gait: Deferred   Labs: I have reviewed the data as listed    Component Value Date/Time   NA 131 (L) 04/05/2019 0953   K 4.2 04/05/2019 0953   CL 95 (L) 04/05/2019 0953   CO2 28 04/05/2019 0953   GLUCOSE 175 (H) 04/05/2019 0953   BUN 7 (L) 04/05/2019 0953   CREATININE 0.79 04/05/2019 0953   CALCIUM 9.4 04/05/2019 0953   PROT 7.1 04/05/2019 0953   ALBUMIN 3.8 04/05/2019 0953   AST 18 04/05/2019 0953   ALT 16 04/05/2019 0953   ALKPHOS 78 04/05/2019 0953   BILITOT 0.6 04/05/2019 0953   GFRNONAA >60 04/05/2019  0953   GFRAA >60 04/05/2019 0953   Lab Results  Component Value Date   WBC 3.1 (L) 04/05/2019   NEUTROABS 1.5 (L) 04/05/2019   HGB 12.7 04/05/2019   HCT 39.1 04/05/2019   MCV 92.4 04/05/2019   PLT 254 04/05/2019      Assessment/Plan 1. Chemotherapy-induced peripheral neuropathy [facility])  Ms. Sisler has a symmetric distal polyneuropathy which is secondary to taxol exposure combined with likely pre-existing diabetic neuropathy.  There is evidence of both small and large fiber involvement.    She has failed multiple neuropathic pain drugs, including Gabapentin, Amytryptilline, Pregabalin, Lidocaine.  Today we recommended increasing Lyrica to 124m BID, which patient has already started this past week.   Do not appreciate clinically significant dyphasia today, no need for further workup or CNS imaging.  We appreciate the opportunity to participate in the care of MAnsel Bong  We ask that she return as needed for further evaluation.  All questions were answered. The patient knows to call the clinic with any problems, questions or concerns. No barriers to learning were detected.  The total time spent in the encounter was 30 minutes and more than 50% was on counseling and review of test results   ZVentura Sellers MD Medical Director of Neuro-Oncology [facility] [place] [place]03/11/21 10:04 AM

## 2019-04-19 ENCOUNTER — Other Ambulatory Visit: Payer: Self-pay

## 2019-04-19 ENCOUNTER — Ambulatory Visit (INDEPENDENT_AMBULATORY_CARE_PROVIDER_SITE_OTHER): Payer: Medicare Other

## 2019-04-19 DIAGNOSIS — R0600 Dyspnea, unspecified: Secondary | ICD-10-CM | POA: Diagnosis not present

## 2019-04-19 DIAGNOSIS — R0609 Other forms of dyspnea: Secondary | ICD-10-CM

## 2019-04-24 ENCOUNTER — Other Ambulatory Visit: Payer: Self-pay

## 2019-04-24 ENCOUNTER — Other Ambulatory Visit: Payer: Self-pay | Admitting: Hematology

## 2019-04-24 ENCOUNTER — Ambulatory Visit
Admission: RE | Admit: 2019-04-24 | Discharge: 2019-04-24 | Disposition: A | Discharge status: Home / Self Care | Payer: Medicare Other | Source: Ambulatory Visit | Attending: Hematology | Admitting: Hematology

## 2019-04-24 DIAGNOSIS — Z170 Estrogen receptor positive status [ER+]: Secondary | ICD-10-CM

## 2019-04-24 DIAGNOSIS — N6489 Other specified disorders of breast: Secondary | ICD-10-CM | POA: Diagnosis not present

## 2019-04-24 DIAGNOSIS — Z853 Personal history of malignant neoplasm of breast: Secondary | ICD-10-CM | POA: Diagnosis not present

## 2019-04-24 DIAGNOSIS — C50412 Malignant neoplasm of upper-outer quadrant of left female breast: Secondary | ICD-10-CM

## 2019-04-24 DIAGNOSIS — R922 Inconclusive mammogram: Secondary | ICD-10-CM | POA: Diagnosis not present

## 2019-05-01 ENCOUNTER — Other Ambulatory Visit: Payer: Self-pay | Admitting: Cardiovascular Disease

## 2019-05-04 ENCOUNTER — Telehealth: Payer: Self-pay | Admitting: *Deleted

## 2019-05-04 NOTE — Telephone Encounter (Signed)
Laurine Blazer, [place]  [phone] PM EDT    Patient notified. Copy to pmd. Follow up scheduled for May with Dr. Bronson Ing.

## 2019-05-04 NOTE — Telephone Encounter (Signed)
-----   Message from [name], MD sent at 04/20/2019  3:13 PM EDT ----- Normal pumping function

## 2019-05-09 DIAGNOSIS — Z961 Presence of intraocular lens: Secondary | ICD-10-CM | POA: Diagnosis not present

## 2019-05-09 DIAGNOSIS — E119 Type 2 diabetes mellitus without complications: Secondary | ICD-10-CM | POA: Diagnosis not present

## 2019-05-09 DIAGNOSIS — H401131 Primary open-angle glaucoma, bilateral, mild stage: Secondary | ICD-10-CM | POA: Diagnosis not present

## 2019-06-29 ENCOUNTER — Telehealth (INDEPENDENT_AMBULATORY_CARE_PROVIDER_SITE_OTHER): Payer: Medicare Other | Admitting: Cardiovascular Disease

## 2019-06-29 ENCOUNTER — Other Ambulatory Visit: Payer: Self-pay

## 2019-06-29 ENCOUNTER — Encounter: Payer: Self-pay | Admitting: Cardiovascular Disease

## 2019-06-29 VITALS — BP 146/89 | HR 69 | Ht 64.0 in | Wt 170.0 lb

## 2019-06-29 DIAGNOSIS — Z170 Estrogen receptor positive status [ER+]: Secondary | ICD-10-CM | POA: Diagnosis not present

## 2019-06-29 DIAGNOSIS — I10 Essential (primary) hypertension: Secondary | ICD-10-CM | POA: Diagnosis not present

## 2019-06-29 DIAGNOSIS — I25118 Atherosclerotic heart disease of native coronary artery with other forms of angina pectoris: Secondary | ICD-10-CM

## 2019-06-29 DIAGNOSIS — C50412 Malignant neoplasm of upper-outer quadrant of left female breast: Secondary | ICD-10-CM | POA: Diagnosis not present

## 2019-06-29 DIAGNOSIS — I519 Heart disease, unspecified: Secondary | ICD-10-CM | POA: Diagnosis not present

## 2019-06-29 NOTE — Patient Instructions (Signed)
Medication Instructions:  Your physician recommends that you continue on your current medications as directed. Please refer to the Current Medication list given to you today.  *If you need a refill on your cardiac medications before your next appointment, please call your pharmacy*   Lab Work: None today If you have labs (blood work) drawn today and your tests are completely normal, you will receive your results only by: . MyChart Message (if you have MyChart) OR . A paper copy in the mail If you have any lab test that is abnormal or we need to change your treatment, we will call you to review the results.   Testing/Procedures: None today   Follow-Up: At [facility], you and your health needs are our priority.  As part of our continuing mission to provide you with exceptional heart care, we have created designated Provider Care Teams.  These Care Teams include your primary Cardiologist (physician) and Advanced Practice Providers (APPs -  Physician Assistants and Nurse Practitioners) who all work together to provide you with the care you need, when you need it.  We recommend signing up for the patient portal called "MyChart".  Sign up information is provided on this After Visit Summary.  MyChart is used to connect with patients for Virtual Visits (Telemedicine).  Patients are able to view lab/test results, encounter notes, upcoming appointments, etc.  Non-urgent messages can be sent to your provider as well.   To learn more about what you can do with MyChart, go to [email or web address].    Your next appointment:   12 month(s)  The format for your next appointment:   In Person  Provider:   Suresh Koneswaran, MD   Other Instructions None      Thank you for choosing [facility] !         

## 2019-06-29 NOTE — Progress Notes (Signed)
Virtual Visit via Telephone Note   This visit type was conducted due to national recommendations for restrictions regarding the COVID-19 Pandemic (e.g. social distancing) in an effort to limit this patient's exposure and mitigate transmission in our community.  Due to her co-morbid illnesses, this patient is at least at moderate risk for complications without adequate follow up.  This format is felt to be most appropriate for this patient at this time.  The patient did not have access to video technology/had technical difficulties with video requiring transitioning to audio format only (telephone).  All issues noted in this document were discussed and addressed.  No physical exam could be performed with this format.  Please refer to the patient's chart for her  consent to telehealth for [facility].   The patient was identified using 2 identifiers.  Date:  06/29/2019   ID:  Penny Huynh, DOB 09/19/1933, MRN [id number]  Patient Location: Home Provider Location: Home  PCP:  Manon Hilding, MD  Cardiologist:  Kate Sable, MD  Electrophysiologist:  None   Evaluation Performed:  Follow-Up Visit  Chief Complaint:  Exertional dyspnea  History of Present Illness:    Penny Huynh is a 86 y.o. female with nonobstructive coronary artery disease, hypertension, and diastolic dysfunction.She also has left-sided breast cancer and underwent left total mastectomy in January 2019 and was treated with Taxol and Herceptin. She also underwent radiation therapy.   Her shortness of breath has resolved. She does use her inhaler more often due to the increased pollen counts.  She denies chest pain, palpitations, and fatigue.  She had some leg swelling after a long car journey but this has improved with walking.  She has gone back to [employer] and wants to lose some weight.   Past Medical History:  Diagnosis Date  . Arthritis    "maybe a little in my left knee" (02/23/2017)   . Asthmatic bronchitis   . Cancer of left breast ([place]) 2019  . Coronary atherosclerosis    Minor nonobstructive CAD at cardiac catheterization 2009  . Dyslipidemia   . Femoral bruit 7/08   With normal ABIs  . Hypertension   . Iliac artery aneurysm ([place])   . Lower extremity edema    Chronic  . Pneumonia ~ 1950   in 8th grade  . Type II diabetes mellitus ([place])    Past Surgical History:  Procedure Laterality Date  . APPENDECTOMY    . BREAST BIOPSY Left 02/2017  . CARDIAC CATHETERIZATION     "long time ago" (02/23/2017)  . MASTECTOMY COMPLETE / SIMPLE W/ SENTINEL NODE BIOPSY Left 02/23/2017   TOTAL MATECTOMY;  LEFT AXILLARY SENTINEL LYMPH NODE BIOPSY ERAS PATHWAY  . MASTECTOMY WITH RADIOACTIVE SEED GUIDED EXCISION AND AXILLARY SENTINEL LYMPH NODE BIOPSY Left 02/23/2017   Procedure: LEFT TOTAL MATECTOMY;  LEFT AXILLARY SENTINEL LYMPH NODE BIOPSY ERAS PATHWAY;  Surgeon: Excell Seltzer, MD;  Location: [facility];  Service: General;  Laterality: Left;  . PILONIDAL CYST EXCISION    . PORT-A-CATH REMOVAL Right 09/27/2018   Procedure: REMOVAL PORT-A-CATH;  Surgeon: Fanny Skates, MD;  Location: [facility];  Service: General;  Laterality: Right;  . PORTA CATH INSERTION  02/23/2017  . PORTACATH PLACEMENT N/A 02/23/2017   Procedure: INSERTION PORT-A-CATH;  Surgeon: Excell Seltzer, MD;  Location: [facility];  Service: General;  Laterality: N/A;  . SHOULDER ARTHROSCOPY W/ ROTATOR CUFF REPAIR Right      Current Meds  Medication Sig  . albuterol (VENTOLIN  HFA) 108 (90 Base) MCG/ACT inhaler Inhale into the lungs every 6 (six) hours as needed for wheezing or shortness of breath.  Marland Kitchen alendronate (FOSAMAX) 70 MG tablet Take 70 mg by mouth once a week.  Marland Kitchen amLODipine (NORVASC) 5 MG tablet Take 1 tablet (5 mg total) by mouth daily.  Marland Kitchen anastrozole (ARIMIDEX) 1 MG tablet Take 1 tablet (1 mg total) by mouth daily.  Marland Kitchen aspirin 81 MG EC tablet Take 81 mg by mouth at bedtime.   . chlorthalidone  (HYGROTON) 25 MG tablet Take 25 mg by mouth daily.   . Coenzyme Q10 (COQ10) 100 MG CAPS Take 100 mg by mouth daily.    Marland Kitchen losartan (COZAAR) 100 MG tablet Take 100 mg by mouth daily.  . metFORMIN (GLUCOPHAGE) 500 MG tablet Take 500 mg by mouth daily with breakfast.   . pregabalin (LYRICA) 150 MG capsule Take 1 capsule (150 mg total) by mouth 2 (two) times daily.  . rosuvastatin (CRESTOR) 5 MG tablet Take 5 mg by mouth every other day.  . vitamin B-12 (CYANOCOBALAMIN) 1000 MCG tablet Take 1,000 mcg by mouth daily.  . Vitamin D, Cholecalciferol, 25 MCG (1000 UT) CAPS Take 1 capsule by mouth daily.   . [DISCONTINUED] Biotin (BIOTIN 5000) 5 MG CAPS Take 1 capsule by mouth daily.     Allergies:   Patient has no known allergies.   Social History   Tobacco Use  . Smoking status: Former Smoker    Packs/day: 2.00    Years: 46.00    Pack years: 92.00    Types: Cigarettes    Start date: 11/09/1953    Quit date: 2000    Years since quitting: 21.4  . Smokeless tobacco: Never Used  Substance Use Topics  . Alcohol use: Yes    Alcohol/week: 2.0 standard drinks    Types: 2 Glasses of wine per week    Comment: social  . Drug use: No     Family Hx: The patient's family history includes Cancer in her father; Diabetes in her mother; Stroke in her father.  ROS:   Please see the history of present illness.     All other systems reviewed and are negative.   Prior CV studies:   The following studies were reviewed today:  Echo 04/19/19:  1. Left ventricular ejection fraction, by estimation, is 60 to 65%. The  left ventricle has normal function. The left ventricle has no regional  wall motion abnormalities. Left ventricular diastolic parameters are  consistent with Grade I diastolic  dysfunction (impaired relaxation).  2. Right ventricular systolic function is normal. The right ventricular  size is normal. There is normal pulmonary artery systolic pressure.  3. The mitral valve is normal in  structure. Mild mitral valve  regurgitation. No evidence of mitral stenosis.  4. The aortic valve has an indeterminant number of cusps. Aortic valve  regurgitation is not visualized. No aortic stenosis is present.  5. The inferior vena cava is normal in size with greater than 50%  respiratory variability, suggesting right atrial pressure of 3 mmHg.    She underwent a normal nuclear stress test on 08/09/15 and demonstrated a hypertensive response to exercise.  Labs/Other Tests and Data Reviewed:    EKG:  No ECG reviewed.  Recent Labs: 04/05/2019: ALT 16; BUN 7; Creatinine 0.79; Hemoglobin 12.7; Platelet Count 254; Potassium 4.2; Sodium 131   Recent Lipid Panel No results found for: CHOL, TRIG, HDL, CHOLHDL, LDLCALC, LDLDIRECT  Wt Readings from Last 3 Encounters:  06/29/19 170 lb (77.1 kg)  04/13/19 170 lb 11.2 oz (77.4 kg)  04/05/19 178 lb 12.8 oz (81.1 kg)     Objective:    Vital Signs:  BP (!) 146/89   Pulse 69   Ht 5\' 4"  (1.626 m)   Wt 170 lb (77.1 kg)   BMI 29.18 kg/m    VITAL SIGNS:  reviewed  ASSESSMENT & PLAN:    1. Nonobstructive CAD: Denies anginal symptoms. Normal nuclear stress test on 08/09/15 with a hypertensive response to exercise.Currently on aspirin and rosuvastatin.   2. Essential HTN:BP mildly elevated. I told her to continue to monitor this. She says it has been generally running in the 130-150/70-85 range.  3. Diastolic dysfunction/dyspnea on exertion: No evidence of volume overload.Needs optimal BP control.  Grade 1 diastolic dysfunction by echocardiogram in 3/21.   4. Left-sided breast cancer: Status post treatment with Taxol and Herceptinand radiation therapy. Cardiac function will need continued monitoring. LVEF 60-65% percent by echocardiogram in 3/21.     COVID-19 Education: The signs and symptoms of COVID-19 were discussed with the patient and how to seek care for testing (follow up with PCP or arrange E-visit).  The importance  of social distancing was discussed today.  Time:   Today, I have spent 20 minutes with the patient with telehealth technology discussing the above problems.     Medication Adjustments/Labs and Tests Ordered: Current medicines are reviewed at length with the patient today.  Concerns regarding medicines are outlined above.   Tests Ordered: No orders of the defined types were placed in this encounter.   Medication Changes: No orders of the defined types were placed in this encounter.   Follow Up:  In Person in 1 year(s)  Signed, Kate Sable, MD  06/29/2019 10:17 AM    [place]

## 2019-07-12 DIAGNOSIS — E1165 Type 2 diabetes mellitus with hyperglycemia: Secondary | ICD-10-CM | POA: Diagnosis not present

## 2019-07-12 DIAGNOSIS — E1140 Type 2 diabetes mellitus with diabetic neuropathy, unspecified: Secondary | ICD-10-CM | POA: Diagnosis not present

## 2019-07-12 DIAGNOSIS — E871 Hypo-osmolality and hyponatremia: Secondary | ICD-10-CM | POA: Diagnosis not present

## 2019-07-12 DIAGNOSIS — E7800 Pure hypercholesterolemia, unspecified: Secondary | ICD-10-CM | POA: Diagnosis not present

## 2019-07-12 DIAGNOSIS — E876 Hypokalemia: Secondary | ICD-10-CM | POA: Diagnosis not present

## 2019-07-12 DIAGNOSIS — I10 Essential (primary) hypertension: Secondary | ICD-10-CM | POA: Diagnosis not present

## 2019-07-14 ENCOUNTER — Telehealth: Payer: Self-pay | Admitting: *Deleted

## 2019-07-14 NOTE — Telephone Encounter (Signed)
Received Cancer Claim addressed and signed by surgeons from collaborative dropped off at registration.  Message left for Penny Huynh ([phone]) requesting return call to clarify need.   Patient will need to call [place] patient Accounting/Billing [phone]) and [facility] to sign release for claim if records, itemized billing with codes are what she needs to gather to return insurance company.   [facility] notified.

## 2019-07-17 DIAGNOSIS — I10 Essential (primary) hypertension: Secondary | ICD-10-CM | POA: Diagnosis not present

## 2019-07-17 DIAGNOSIS — R252 Cramp and spasm: Secondary | ICD-10-CM | POA: Diagnosis not present

## 2019-07-17 DIAGNOSIS — C50919 Malignant neoplasm of unspecified site of unspecified female breast: Secondary | ICD-10-CM | POA: Diagnosis not present

## 2019-07-17 DIAGNOSIS — Z6831 Body mass index (BMI) 31.0-31.9, adult: Secondary | ICD-10-CM | POA: Diagnosis not present

## 2019-07-17 DIAGNOSIS — E1165 Type 2 diabetes mellitus with hyperglycemia: Secondary | ICD-10-CM | POA: Diagnosis not present

## 2019-07-17 DIAGNOSIS — J45909 Unspecified asthma, uncomplicated: Secondary | ICD-10-CM | POA: Diagnosis not present

## 2019-07-17 DIAGNOSIS — Z0001 Encounter for general adult medical examination with abnormal findings: Secondary | ICD-10-CM | POA: Diagnosis not present

## 2019-07-17 DIAGNOSIS — E871 Hypo-osmolality and hyponatremia: Secondary | ICD-10-CM | POA: Diagnosis not present

## 2019-08-11 ENCOUNTER — Other Ambulatory Visit: Payer: Self-pay | Admitting: Internal Medicine

## 2019-09-27 ENCOUNTER — Telehealth: Payer: Self-pay | Admitting: *Deleted

## 2019-09-27 NOTE — Telephone Encounter (Signed)
Penny Huynh [phone]) called [facility] on hold.    H.I.M[facility] called this nurse to locate signed ROI and inquiry of information needed by patient.  [name] reports she signed release included with [employer] form for policy [id number].  Form completed and mailed to patient by this nurse on 09/18/2019 with note with tips to obtain billing and further records if needed.  H.I.M. advised to view copy of form returned to H.I.M[facility] to be scanned to notes and correspondence of EMR.    Connected with patient.  "I hung up,called [facility] to check status.  They received everything they need.  I apologize for calling."  Advised to use "Handy Tips Note" provided by this nurse to contact billing if itemized bill and codes needed by [facility].  Currently no further needs or questions.  Message left for H.I.M. of no records needed at this time.

## 2019-10-04 NOTE — Progress Notes (Signed)
[place]   Telephone:([phone]) [phone] Fax:(336) [phone]   Clinic Follow up Note   Patient Care Team: Sasser, Silvestre Moment, MD as PCP - General (Cardiology) [name], MD (Inactive) as PCP - Cardiology (Cardiology) Truitt Merle, MD as Consulting Physician (Hematology) Excell Seltzer, MD (Inactive) as Consulting Physician (General Surgery) Alla Feeling, NP as Nurse Practitioner (Nurse Practitioner) [name], MD (Inactive) as Attending Physician (Cardiology)  Date of Service:  10/06/2019  CHIEF COMPLAINT:  F/u on left breast cancer  SUMMARY OF ONCOLOGIC HISTORY: Oncology History Overview Note  Cancer Staging Carcinoma of upper-inner quadrant of left breast in female, estrogen receptor positive ([place]) Staging form: Breast, AJCC 8th Edition - Clinical stage from 01/18/2017: Stage IB (cT2, cN0, cM0, G3, ER: Positive, PR: Positive, HER2: Positive) - Signed by Truitt Merle, MD on 02/10/2017 - Pathologic stage from 02/23/2017: Stage IB (pT3, pN0, cM0, G3, ER: Positive, PR: Positive, HER2: Positive) - Signed by Alla Feeling, NP on 03/16/2017  Carcinoma of upper-outer quadrant of left breast in female, estrogen receptor positive ([place]) Staging form: Breast, AJCC 8th Edition - Clinical stage from 01/18/2017: Stage IB (cT2, cN0, cM0, G2, ER: Positive, PR: Positive, HER2: Negative) - Unsigned - Pathologic stage from 02/23/2017: Stage IB (pT2, pN0(sn), cM0, G3, ER: Positive, PR: Positive, HER2: Negative) - Signed by Alla Feeling, NP on 03/16/2017     Carcinoma of upper-outer quadrant of left breast in female, estrogen receptor positive ([place])  01/13/2017 Mammogram   FINDINGS: In the left breast, a spiculated mass lies in the upper outer quadrant. In the medial posterior left breast, there is a larger lobulated mass. These masses correspond to the palpable abnormalities. No other discrete left breast masses.  In the right breast, there are no discrete masses.  There are no areas of architectural distortion.  In both breasts there are scattered calcifications, rounded punctate, with a few other larger coarse dystrophic calcifications, without significant change from the previous screening mammogram, which is dated 12/04/2009.  Mammographic images were processed with CAD.  On physical exam, there is a firm palpable mass in the upper outer left breast and another firm mass in the medial left breast.    01/13/2017 Breast US   Targeted ultrasound is performed, showing an irregular hypoechoic shadowing mass in the left breast at the 2:30 o'clock position, 5 cm the nipple, measuring 3.3 x 2.8 x 2.6 cm. In the 10:30 o'clock position of the left breast, 5 cm the nipple, there is irregular hypoechoic mass with somewhat more lobulated margins, corresponding to the lobulated mass seen mammographically. This measures 4.8 x 2.9 x 4.8 cm. Both masses show internal vascularity on color Doppler analysis.  Sonographic evaluation of the left axilla shows several nodes with thickened cortices. Status cortex measured is 5 mm. None of these lymph nodes appear enlarged but overall size criteria.  IMPRESSION: 1. Two masses in the left breast, 1 at the 2:30 o'clock position in the other at the 10:30 o'clock position, both highly suspicious for breast carcinoma. 2. Several abnormal left axillary lymph nodes with thickened cortices. 3. No evidence of right breast malignancy.    01/18/2017 Pathology Results   Diagnosis 1. Breast, left, needle core biopsy, 2:30 o'clock - INVASIVE MAMMARY CARCINOMA - MAMMARY CARCINOMA IN-SITU - SEE COMMENT 2. Lymph node, needle/core biopsy, left axilla - NO CARCINOMA IDENTIFIED - SEE COMMENT 3. Breast, left, needle core biopsy, 10:30 o'clock - INVASIVE DUCTAL CARCINOMA - SEE COMMENT  1. PROGNOSTIC INDICATORS Results:  IMMUNOHISTOCHEMICAL AND MORPHOMETRIC ANALYSIS PERFORMED MANUALLY Estrogen Receptor: 100%,  POSITIVE, STRONG STAINING INTENSITY Progesterone Receptor: 60%, POSITIVE, STRONG STAINING INTENSITY Proliferation Marker Ki67: 60%  1. FLUORESCENCE IN-SITU HYBRIDIZATION Results: HER2 - NEGATIVE RATIO OF HER2/CEP17 SIGNALS 1.28 AVERAGE HER2 COPY NUMBER PER CELL 2.75  3. PROGNOSTIC INDICATORS Results: IMMUNOHISTOCHEMICAL AND MORPHOMETRIC ANALYSIS PERFORMED MANUALLY Estrogen Receptor: 100%, POSITIVE, STRONG STAINING INTENSITY Progesterone Receptor: 50%, POSITIVE, STRONG STAINING INTENSITY Proliferation Marker Ki67: 70%  3. FLUORESCENCE IN-SITU HYBRIDIZATION Results: HER2 - **POSITIVE** RATIO OF HER2/CEP17 SIGNALS 2.80 AVERAGE HER2 COPY NUMBER PER CELL 6.15  Microscopic Comment 1. The biopsy material shows an infiltrative proliferation of cells with large vesicular nuclei with inconspicuous nucleoli, arranged linearly and in small clusters. Based on the biopsy, the carcinoma appears Nottingham grade 2 of 3 and measures 0.9 cm in greatest linear extent. E-cadherin and prognostic markers (ER/PR/ki-67/HER2-FISH)are pending and will be reported in an addendum. Dr. Lyndon Code reviewed the case and agrees with the above diagnosis.   02/10/2017 Initial Diagnosis   Carcinoma of upper-outer quadrant of left breast in female, estrogen receptor positive ([place])   02/19/2017 Imaging   Bone Scan whole Body 02/19/17 IMPRESSION: Negative for evidence of bony metastatic disease.   02/19/2017 Imaging   CT CAP W Contrast 02/19/17 IMPRESSION: 1. Small right middle lobe pulmonary nodules up to 0.8 cm, nonspecific. Non-contrast chest CT at 3-6 months is recommended. If the nodules are stable at time of repeat CT, then future CT at 18-24 months (from today's scan) is considered optional for low-risk patients, but is recommended for high-risk patients. This recommendation follows the consensus statement: Guidelines for Management of Incidental Pulmonary Nodules Detected on CT Images: From the Fleischner  Society 2017; Radiology 2017; 284:228-243. 2. Subcentimeter hepatic lesions likely cysts but incompletely characterized due to small size. Attention on follow-up imaging recommended. 3. 2.1 cm fusiform right common iliac artery aneurysm. Continued surveillance recommended. 4. Aortic Atherosclerosis (ICD10-I70.0) and Emphysema (ICD10-J43.9).   02/23/2017 Pathology Results   Diagnosis 1. Breast, simple mastectomy, Left Total - INVASIVE DUCTAL CARCINOMA, MULTIFOCAL, NOTTINGHAM GRADE 3/3 (5.3 CM, 3.5 CM) - INVASIVE CARCINOMA INVOLVES THE DERMIS - DUCTAL CARCINOMA IN SITU, INTERMEDIATE GRADE - HYALINIZED FIBROADENOMA - MARGINS UNINVOLVED BY CARCINOMA - NO CARCINOMA IDENTIFIED IN TWO LYMPH NODES (0/2) - SEE ONCOLOGY TABLE AND COMMENT BELOW 2. Lymph node, sentinel, biopsy, Left Axillary - NO CARCINOMA IDENTIFIED IN ONE LYMPH NODE (0/1) 3. Lymph node, sentinel, biopsy - NO CARCINOMA IDENTIFIED IN ONE LYMPH NODE (0/1) 4. Lymph node, sentinel, biopsy - NO CARCINOMA IDENTIFIED IN ONE LYMPH NODE (0/1) 5. Lymph node, sentinel, biopsy - NO CARCINOMA IDENTIFIED IN ONE LYMPH NODE (0/1) Microscopic Comment 1. BREAST, INVASIVE TUMOR Procedure: Simple mastectomy Laterality: Left Tumor Size: 5. 3 cm, 3.5 cm Histologic Type: Invasive carcinoma of no special type (ductal, not otherwise specified) Grade: Nottingham Grade 3 Tubular Differentiation: 3 Nuclear Pleomorphism: 3 Mitotic Count: 2 Ductal Carcinoma in Situ (DCIS): Present Extent of Tumor: Skin: Invasive carcinoma directly invades into the dermis or epidermis without skin ulceration Margins: Invasive carcinoma, distance from closest margin: 1.5 cm (posterior) DCIS, distance from closest margin: > 1 cm Regional Lymph Nodes: Number of Lymph Nodes Examined: 6 Number of Sentinel Lymph Nodes Examined: 4 Lymph Nodes with Macrometastases: 0 Lymph Nodes with Micrometastases: 0 Lymph Nodes with Isolated Tumor Cells: 0 Treatment effect: No known  presurgical therapy Breast Prognostic Profile: See Also ([id number]) Estrogen Receptor: Positive (100%, strong); Positive (100%, strong) Progesterone Receptor: Positive (50%, strong); Positive (50%, strong) Her2: Positive (Ratio  2.80); Negative (Ratio 1.28) Ki-67: 70%; 60% Best tumor block for sendout testing: 1E Pathologic Stage Classification (pTNM, AJCC 8th Edition): Primary Tumor: mpT3 Regional Lymph Nodes: pN0 COMMENT: The two invasive carcinomas in the breast have slightly different morphologies. The larger lesion has a papillary and micropapillary pattern admixed with typical ductal carcinoma while the smaller lesion is more typical of a ductal lesion with linear arrays (E-cadherin performed on biopsy). There are foci within the larger lesion that are concerning for lymphovascular space invasion.    04/07/2017 - 04/27/2018 Chemotherapy   weekly taxol and herceptin x12 weeks, 04/07/17-06/23/17. Then herceptin q3 weeks for total 1 year which was switched to [place] on 07/14/17. Stopped after 2 doses due to worsening neuropathy. Switched back to maintenance Herceptin started 8/12019. Completed on 04/27/18.    07/20/2017 - 09/03/2017 Radiation Therapy   She started adjuvant radiation on 07/20/17 with Dr. Lisbeth Renshaw and plans to complete on 09/03/17.    10/2017 -  Anti-estrogen oral therapy   Anastrozole 1m daily starting 10/2017   09/27/2018 Procedure   She had her PAC removed on 09/27/18.       CURRENT THERAPY:  Anastrozole 118mdaily starting 10/2017  INTERVAL HISTORY:  Penny HOPWOODs here for a follow up of left breast cancer. She was previously seen by me 6 months ago. She presents to the clinic with her husband. She notes she is doing well. She still has pain in her feet from neuropathy. She is on Lyrica but notes this is not controlling her pain. She notes her neuropathy does not effect her sleep and she is able to ambulate with cane and function during the day. She denies recent  falls. She is on Anastrozole and tolerating well. She notes only have 1 spot of her back has pain but with massage this resolves. She notes she is eating adequately. She notes an ad for neuropathy center. She tried to look into it, but not sure about going.     REVIEW OF SYSTEMS:   Constitutional: Denies fevers, chills or abnormal weight loss Eyes: Denies blurriness of vision Ears, nose, mouth, throat, and face: Denies mucositis or sore throat Respiratory: Denies cough, dyspnea or wheezes Cardiovascular: Denies palpitation, chest discomfort or lower extremity swelling Gastrointestinal:  Denies nausea, heartburn or change in bowel habits Skin: Denies abnormal skin rashes MSK: (+) Mild occasional back pain  Lymphatics: Denies new lymphadenopathy or easy bruising Neurological: (+) Stable neuropathy in her feet  Behavioral/Psych: Mood is stable, no new changes  All other systems were reviewed with the patient and are negative.  MEDICAL HISTORY:  Past Medical History:  Diagnosis Date  . Arthritis    "maybe a little in my left knee" (02/23/2017)  . Asthmatic bronchitis   . Cancer of left breast ([facility]  . Coronary atherosclerosis    Minor nonobstructive CAD at cardiac catheterization 2009  . Dyslipidemia   . Femoral bruit 7/08   With normal ABIs  . Hypertension   . Iliac artery aneurysm ([facility]  . Lower extremity edema    Chronic  . Pneumonia ~ 1950   in 8th grade  . Type II diabetes mellitus ([facility]    SURGICAL HISTORY: Past Surgical History:  Procedure Laterality Date  . APPENDECTOMY    . BREAST BIOPSY Left 02/2017  . CARDIAC CATHETERIZATION     "long time ago" (02/23/2017)  . MASTECTOMY COMPLETE / SIMPLE W/ SENTINEL NODE BIOPSY Left 02/23/2017   TOTAL MATECTOMY;  LEFT AXILLARY  SENTINEL LYMPH NODE BIOPSY ERAS PATHWAY  . MASTECTOMY WITH RADIOACTIVE SEED GUIDED EXCISION AND AXILLARY SENTINEL LYMPH NODE BIOPSY Left 02/23/2017   Procedure: LEFT TOTAL MATECTOMY;  LEFT AXILLARY  SENTINEL LYMPH NODE BIOPSY ERAS PATHWAY;  Surgeon: Excell Seltzer, MD;  Location: [place];  Service: General;  Laterality: Left;  . PILONIDAL CYST EXCISION    . PORT-A-CATH REMOVAL Right 09/27/2018   Procedure: REMOVAL PORT-A-CATH;  Surgeon: Fanny Skates, MD;  Location: [facility];  Service: General;  Laterality: Right;  . PORTA CATH INSERTION  02/23/2017  . PORTACATH PLACEMENT N/A 02/23/2017   Procedure: INSERTION PORT-A-CATH;  Surgeon: Excell Seltzer, MD;  Location: [place];  Service: General;  Laterality: N/A;  . SHOULDER ARTHROSCOPY W/ ROTATOR CUFF REPAIR Right     I have reviewed the social history and family history with the patient and they are unchanged from previous note.  ALLERGIES:  has No Known Allergies.  MEDICATIONS:  Current Outpatient Medications  Medication Sig Dispense Refill  . albuterol (VENTOLIN HFA) 108 (90 Base) MCG/ACT inhaler Inhale into the lungs every 6 (six) hours as needed for wheezing or shortness of breath.    Marland Kitchen alendronate (FOSAMAX) 70 MG tablet Take 70 mg by mouth once a week.    Marland Kitchen amLODipine (NORVASC) 5 MG tablet Take 1 tablet (5 mg total) by mouth daily. 90 tablet 3  . anastrozole (ARIMIDEX) 1 MG tablet Take 1 tablet (1 mg total) by mouth daily. 90 tablet 3  . aspirin 81 MG EC tablet Take 81 mg by mouth at bedtime.     . chlorthalidone (HYGROTON) 25 MG tablet Take 25 mg by mouth daily.     . Coenzyme Q10 (COQ10) 100 MG CAPS Take 100 mg by mouth daily.      Marland Kitchen losartan (COZAAR) 100 MG tablet Take 100 mg by mouth daily.    . metFORMIN (GLUCOPHAGE) 500 MG tablet Take 500 mg by mouth daily with breakfast.     . pregabalin (LYRICA) 150 MG capsule TAKE ONE CAPSULE BY MOUTH TWICE A DAY 60 capsule 2  . rosuvastatin (CRESTOR) 5 MG tablet Take 5 mg by mouth every other day.    . vitamin B-12 (CYANOCOBALAMIN) 1000 MCG tablet Take 1,000 mcg by mouth daily.    . Vitamin D, Cholecalciferol, 25 MCG (1000 UT) CAPS Take 1 capsule by mouth daily.       No current facility-administered medications for this visit.    PHYSICAL EXAMINATION: ECOG PERFORMANCE STATUS: 1 - Symptomatic but completely ambulatory  Vitals:   10/06/19 1029  BP: (!) 142/72  Pulse: 66  Resp: 17  Temp: (!) 97.4 F (36.3 C)  SpO2: 99%   Filed Weights   10/06/19 1029  Weight: 174 lb 6.4 oz (79.1 kg)    GENERAL:alert, no distress and comfortable SKIN: skin color, texture, turgor are normal, no rashes or significant lesions EYES: normal, Conjunctiva are pink and non-injected, sclera clear  NECK: supple, thyroid normal size, non-tender, without nodularity LYMPH:  no palpable lymphadenopathy in the cervical, axillary  LUNGS: clear to auscultation and percussion with normal breathing effort HEART: regular rate & rhythm and no murmurs and no lower extremity edema ABDOMEN:abdomen soft, non-tender and normal bowel sounds Musculoskeletal:no cyanosis of digits and no clubbing  NEURO: alert & oriented x 3 with fluent speech, no focal motor/sensory deficits BREAST: S/p left mastectomy: surgical incision healed well. (+) Left chest Skin hyperpigmentation from RT. (+) Right lumpy breast tissue, mainly in 12:00 position with small lemon  size lump, benign  LABORATORY DATA:  I have reviewed the data as listed CBC Latest Ref Rng & Units 10/06/2019 04/05/2019 09/16/2018  WBC 4.0 - 10.5 K/uL 4.1 3.1(L) 4.1  Hemoglobin 12.0 - 15.0 g/dL 12.4 12.7 12.6  Hematocrit 36 - 46 % 38.1 39.1 38.9  Platelets 150 - 400 K/uL 215 254 228     CMP Latest Ref Rng & Units 04/05/2019 09/16/2018 04/21/2018  Glucose 70 - 99 mg/dL 175(H) 85 170(H)  BUN 8 - 23 mg/dL 7(L) 15 15  Creatinine 0.44 - 1.00 mg/dL 0.79 0.83 0.81  Sodium 135 - 145 mmol/L 131(L) 133(L) 133(L)  Potassium 3.5 - 5.1 mmol/L 4.2 3.9 4.2  Chloride 98 - 111 mmol/L 95(L) 97(L) 98  CO2 22 - 32 mmol/L 28 27 26   Calcium 8.9 - 10.3 mg/dL 9.4 9.7 9.1  Total Protein 6.5 - 8.1 g/dL 7.1 7.1 6.9  Total Bilirubin 0.3 - 1.2 mg/dL 0.6 0.8  0.6  Alkaline Phos 38 - 126 U/L 78 71 79  AST 15 - 41 U/L 18 19 20   ALT 0 - 44 U/L 16 14 19       RADIOGRAPHIC STUDIES: I have personally reviewed the radiological images as listed and agreed with the findings in the report. No results found.   ASSESSMENT & PLAN:  DALLIS DARDEN is a 86 y.o. female with    1.Multifocal invasive ductal carcinoma of the left breast of female;carcinoma of the upper-outer quadrant of left breast and female, ER/PR positive, HER-2 negative pT2N0M0, G2, Stage 1B; IDC of the upper-inner quadrant left breast, ER+/PR+/HER2+, pT3N0M0 G3, stage IB -Diagnosed in 01/2017. Treated withleft mastectomy, followed by adjuvantchemo with Herceptin and adjuvant radiation.  -She started antiestrogen therapy with anastrozole in 10/2017. Tolerating well overall. Will continue for 5 years -She is clinically doing well and stable. Labs reviewed, CBC and CMP WNL except Na 131. Physical exam shows small lump in 12:00 position of right breast. This was evaluated on 04/2019 by Mammogram/US which was unremarkable.  -Continue Surveillance. She will continue yearly right mammograms, next in 02/2020. -Continue Anastrozole  -F/u in 6 months    2. Peripheral neuropathy, G2, secondary to taxol, DM and T-DM1 -Follow-up with Dr. Mickeal Skinner as needed.  -Multiple neuropathic pain drugs have not improved her sx, including Gabapentin, Amytryptilline, Pregabalin, and Lidocaine. She previously did PT  -The numbness in her fingers and tingling in her feet are still moderate, but manageable so far. Has not improved. Stable.  -She will continue Lyrica which controls her pain. She ambulates well with Cane and walks 1-2 miles daily. -She notes Neuropathy center but not sure if the location was a scam. She can look into it and I will follow up with her before proceeding.  -I also suggested Acupuncture to help with her nerve pain. She will think about it.     3. HTN, CAD, DM -f/u with PCP  4.  Osteoporosis -07/2018 DEXA shows osteoporosis with lowest T-score -2.6 at left forearm radius. Repeat in 07/2020.  -She is on oral Fosamax weekly through her PCP. Continue along with Calcium and Vit d.   5. Memory loss  -She has developed mild expressive aphasia and memory loss in early 2021 -She no longer has home aid and only needs help getting out of tub otherwise she is able to take care of herself at home.  -some improvement lately  -Continue to f/u with Dr. Mickeal Skinner as needed.    Plan -Mammogram in 02/2020 -Continue anastrozole -Lab  and F/u in 6 months.   No problem-specific Assessment & Plan notes found for this encounter.   Orders Placed This Encounter  Procedures  . MM Digital Screening Unilat R    Standing Status:   Future    Standing Expiration Date:   10/05/2020    Order Specific Question:   Reason for Exam (SYMPTOM  OR DIAGNOSIS REQUIRED)    Answer:   screening    Order Specific Question:   Preferred imaging location?    Answer:   [facility]   All questions were answered. The patient knows to call the clinic with any problems, questions or concerns. No barriers to learning was detected. The total time spent in the appointment was 30 minutes.     Truitt Merle, MD 10/06/2019   I, Joslyn Devon, am acting as scribe for Truitt Merle, MD.   I have reviewed the above documentation for accuracy and completeness, and I agree with the above.

## 2019-10-06 ENCOUNTER — Other Ambulatory Visit: Payer: Self-pay

## 2019-10-06 ENCOUNTER — Inpatient Hospital Stay: Payer: Medicare Other

## 2019-10-06 ENCOUNTER — Inpatient Hospital Stay: Payer: Medicare Other | Attending: Hematology | Admitting: Hematology

## 2019-10-06 VITALS — BP 142/72 | HR 66 | Temp 97.4°F | Resp 17 | Ht 64.0 in | Wt 174.4 lb

## 2019-10-06 DIAGNOSIS — Z170 Estrogen receptor positive status [ER+]: Secondary | ICD-10-CM | POA: Diagnosis not present

## 2019-10-06 DIAGNOSIS — C50212 Malignant neoplasm of upper-inner quadrant of left female breast: Secondary | ICD-10-CM

## 2019-10-06 DIAGNOSIS — C50412 Malignant neoplasm of upper-outer quadrant of left female breast: Secondary | ICD-10-CM | POA: Diagnosis not present

## 2019-10-06 DIAGNOSIS — I25118 Atherosclerotic heart disease of native coronary artery with other forms of angina pectoris: Secondary | ICD-10-CM | POA: Diagnosis not present

## 2019-10-06 DIAGNOSIS — I119 Hypertensive heart disease without heart failure: Secondary | ICD-10-CM | POA: Diagnosis not present

## 2019-10-06 DIAGNOSIS — I2510 Atherosclerotic heart disease of native coronary artery without angina pectoris: Secondary | ICD-10-CM | POA: Insufficient documentation

## 2019-10-06 DIAGNOSIS — Z1231 Encounter for screening mammogram for malignant neoplasm of breast: Secondary | ICD-10-CM | POA: Diagnosis not present

## 2019-10-06 LAB — CMP (CANCER CENTER ONLY)
ALT: 10 U/L (ref 0–44)
AST: 16 U/L (ref 15–41)
Albumin: 3.8 g/dL (ref 3.5–5.0)
Alkaline Phosphatase: 46 U/L (ref 38–126)
Anion gap: 7 (ref 5–15)
BUN: 13 mg/dL (ref 8–23)
CO2: 28 mmol/L (ref 22–32)
Calcium: 9.7 mg/dL (ref 8.9–10.3)
Chloride: 95 mmol/L — ABNORMAL LOW (ref 98–111)
Creatinine: 0.75 mg/dL (ref 0.44–1.00)
GFR, Est AFR Am: >60 mL/min (ref >60)
GFR, Estimated: >60 mL/min (ref >60)
Glucose, Bld: 111 mg/dL — ABNORMAL HIGH (ref 70–99)
Potassium: 3.7 mmol/L (ref 3.5–5.1)
Sodium: 130 mmol/L — ABNORMAL LOW (ref 135–145)
Total Bilirubin: 0.6 mg/dL (ref 0.3–1.2)
Total Protein: 7 g/dL (ref 6.5–8.1)

## 2019-10-06 LAB — CBC WITH DIFFERENTIAL (CANCER CENTER ONLY)
Abs Immature Granulocytes: 0 10*3/uL (ref 0.00–0.07)
Basophils Absolute: 0 10*3/uL (ref 0.0–0.1)
Basophils Relative: 1 %
Eosinophils Absolute: 0.1 10*3/uL (ref 0.0–0.5)
Eosinophils Relative: 2 %
HCT: 38.1 % (ref 36.0–46.0)
Hemoglobin: 12.4 g/dL (ref 12.0–15.0)
Immature Granulocytes: 0 %
Lymphocytes Relative: 30 %
Lymphs Abs: 1.2 10*3/uL (ref 0.7–4.0)
MCH: 29.6 pg (ref 26.0–34.0)
MCHC: 32.5 g/dL (ref 30.0–36.0)
MCV: 90.9 fL (ref 80.0–100.0)
Monocytes Absolute: 0.4 10*3/uL (ref 0.1–1.0)
Monocytes Relative: 10 %
Neutro Abs: 2.4 10*3/uL (ref 1.7–7.7)
Neutrophils Relative %: 57 %
Platelet Count: 215 10*3/uL (ref 150–400)
RBC: 4.19 MIL/uL (ref 3.87–5.11)
RDW: 12.5 % (ref 11.5–15.5)
WBC Count: 4.1 10*3/uL (ref 4.0–10.5)
nRBC: 0 % (ref 0.0–0.2)

## 2019-10-06 MED ORDER — DIPHENHYDRAMINE HCL 50 MG/ML IJ SOLN
INTRAMUSCULAR | Status: AC
  Filled 2019-10-06: qty 1

## 2019-10-07 ENCOUNTER — Encounter: Payer: Self-pay | Admitting: Hematology

## 2019-10-10 ENCOUNTER — Telehealth: Payer: Self-pay | Admitting: Hematology

## 2019-10-10 NOTE — Telephone Encounter (Signed)
Scheduled appointments per 9/3 los. Left message for patient with appointments date and time.

## 2019-11-06 DIAGNOSIS — Z961 Presence of intraocular lens: Secondary | ICD-10-CM | POA: Diagnosis not present

## 2019-11-06 DIAGNOSIS — H04123 Dry eye syndrome of bilateral lacrimal glands: Secondary | ICD-10-CM | POA: Diagnosis not present

## 2019-11-06 DIAGNOSIS — H401131 Primary open-angle glaucoma, bilateral, mild stage: Secondary | ICD-10-CM | POA: Diagnosis not present

## 2019-11-13 ENCOUNTER — Other Ambulatory Visit: Payer: Self-pay | Admitting: Internal Medicine

## 2019-11-14 NOTE — Telephone Encounter (Signed)
Please review for refill.  

## 2019-11-20 DIAGNOSIS — E871 Hypo-osmolality and hyponatremia: Secondary | ICD-10-CM | POA: Diagnosis not present

## 2019-11-20 DIAGNOSIS — E1140 Type 2 diabetes mellitus with diabetic neuropathy, unspecified: Secondary | ICD-10-CM | POA: Diagnosis not present

## 2019-11-20 DIAGNOSIS — I10 Essential (primary) hypertension: Secondary | ICD-10-CM | POA: Diagnosis not present

## 2019-11-20 DIAGNOSIS — E782 Mixed hyperlipidemia: Secondary | ICD-10-CM | POA: Diagnosis not present

## 2019-11-20 DIAGNOSIS — C50919 Malignant neoplasm of unspecified site of unspecified female breast: Secondary | ICD-10-CM | POA: Diagnosis not present

## 2019-11-20 DIAGNOSIS — E876 Hypokalemia: Secondary | ICD-10-CM | POA: Diagnosis not present

## 2019-11-23 DIAGNOSIS — I10 Essential (primary) hypertension: Secondary | ICD-10-CM | POA: Diagnosis not present

## 2019-11-23 DIAGNOSIS — J45909 Unspecified asthma, uncomplicated: Secondary | ICD-10-CM | POA: Diagnosis not present

## 2019-11-23 DIAGNOSIS — E782 Mixed hyperlipidemia: Secondary | ICD-10-CM | POA: Diagnosis not present

## 2019-11-23 DIAGNOSIS — Z1212 Encounter for screening for malignant neoplasm of rectum: Secondary | ICD-10-CM | POA: Diagnosis not present

## 2019-11-23 DIAGNOSIS — E6609 Other obesity due to excess calories: Secondary | ICD-10-CM | POA: Diagnosis not present

## 2019-11-23 DIAGNOSIS — E1140 Type 2 diabetes mellitus with diabetic neuropathy, unspecified: Secondary | ICD-10-CM | POA: Diagnosis not present

## 2019-11-23 DIAGNOSIS — E1165 Type 2 diabetes mellitus with hyperglycemia: Secondary | ICD-10-CM | POA: Diagnosis not present

## 2019-11-23 DIAGNOSIS — E871 Hypo-osmolality and hyponatremia: Secondary | ICD-10-CM | POA: Diagnosis not present

## 2019-11-23 DIAGNOSIS — C50919 Malignant neoplasm of unspecified site of unspecified female breast: Secondary | ICD-10-CM | POA: Diagnosis not present

## 2019-11-23 DIAGNOSIS — R252 Cramp and spasm: Secondary | ICD-10-CM | POA: Diagnosis not present

## 2019-11-23 DIAGNOSIS — Z23 Encounter for immunization: Secondary | ICD-10-CM | POA: Diagnosis not present

## 2019-11-23 DIAGNOSIS — Z6830 Body mass index (BMI) 30.0-30.9, adult: Secondary | ICD-10-CM | POA: Diagnosis not present

## 2019-12-06 DIAGNOSIS — K921 Melena: Secondary | ICD-10-CM | POA: Insufficient documentation

## 2019-12-09 DIAGNOSIS — Z23 Encounter for immunization: Secondary | ICD-10-CM | POA: Diagnosis not present

## 2020-01-10 ENCOUNTER — Encounter (INDEPENDENT_AMBULATORY_CARE_PROVIDER_SITE_OTHER): Payer: Self-pay | Admitting: Gastroenterology

## 2020-03-07 ENCOUNTER — Other Ambulatory Visit: Payer: Self-pay

## 2020-03-07 ENCOUNTER — Ambulatory Visit
Admission: RE | Admit: 2020-03-07 | Discharge: 2020-03-07 | Disposition: A | Discharge status: Home / Self Care | Payer: Medicare Other | Source: Ambulatory Visit | Attending: Hematology | Admitting: Hematology

## 2020-03-07 DIAGNOSIS — Z1231 Encounter for screening mammogram for malignant neoplasm of breast: Secondary | ICD-10-CM

## 2020-03-18 DIAGNOSIS — E7800 Pure hypercholesterolemia, unspecified: Secondary | ICD-10-CM | POA: Diagnosis not present

## 2020-03-18 DIAGNOSIS — E1140 Type 2 diabetes mellitus with diabetic neuropathy, unspecified: Secondary | ICD-10-CM | POA: Diagnosis not present

## 2020-03-18 DIAGNOSIS — E1165 Type 2 diabetes mellitus with hyperglycemia: Secondary | ICD-10-CM | POA: Diagnosis not present

## 2020-03-18 DIAGNOSIS — E7801 Familial hypercholesterolemia: Secondary | ICD-10-CM | POA: Diagnosis not present

## 2020-03-18 DIAGNOSIS — E876 Hypokalemia: Secondary | ICD-10-CM | POA: Diagnosis not present

## 2020-03-20 DIAGNOSIS — E871 Hypo-osmolality and hyponatremia: Secondary | ICD-10-CM | POA: Diagnosis not present

## 2020-03-20 DIAGNOSIS — E1140 Type 2 diabetes mellitus with diabetic neuropathy, unspecified: Secondary | ICD-10-CM | POA: Diagnosis not present

## 2020-03-20 DIAGNOSIS — M545 Low back pain, unspecified: Secondary | ICD-10-CM | POA: Diagnosis not present

## 2020-03-20 DIAGNOSIS — C50919 Malignant neoplasm of unspecified site of unspecified female breast: Secondary | ICD-10-CM | POA: Diagnosis not present

## 2020-03-20 DIAGNOSIS — E1165 Type 2 diabetes mellitus with hyperglycemia: Secondary | ICD-10-CM | POA: Diagnosis not present

## 2020-03-20 DIAGNOSIS — I10 Essential (primary) hypertension: Secondary | ICD-10-CM | POA: Diagnosis not present

## 2020-03-20 DIAGNOSIS — R252 Cramp and spasm: Secondary | ICD-10-CM | POA: Diagnosis not present

## 2020-03-20 DIAGNOSIS — E7849 Other hyperlipidemia: Secondary | ICD-10-CM | POA: Diagnosis not present

## 2020-03-26 ENCOUNTER — Other Ambulatory Visit: Payer: Self-pay | Admitting: Internal Medicine

## 2020-03-26 NOTE — Telephone Encounter (Signed)
Rx request 

## 2020-04-05 NOTE — Progress Notes (Signed)
Penny Huynh   [phone] [phone]   Clinic Follow up Note   Patient Care Team: Sasser, Silvestre Moment, MD as PCP - General (Cardiology) [name], MD (Inactive) as PCP - Cardiology (Cardiology) Truitt Merle, MD as Consulting Physician (Hematology) Excell Seltzer, MD (Inactive) as Consulting Physician (General Surgery) Alla Feeling, NP as Nurse Practitioner (Nurse Practitioner) [name], MD (Inactive) as Attending Physician (Cardiology)  Date of Service:  04/08/2020  CHIEF COMPLAINT: F/u on left breast cancer  SUMMARY OF ONCOLOGIC HISTORY: Oncology History Overview Note  Cancer Staging Carcinoma of upper-inner quadrant of left breast in female, estrogen receptor positive ([place]) Staging form: Breast, AJCC 8th Edition - Clinical stage from 01/18/2017: Stage IB (cT2, cN0, cM0, G3, ER: Positive, PR: Positive, HER2: Positive) - Signed by Truitt Merle, MD on 02/10/2017 - Pathologic stage from 02/23/2017: Stage IB (pT3, pN0, cM0, G3, ER: Positive, PR: Positive, HER2: Positive) - Signed by Alla Feeling, NP on 03/16/2017  Carcinoma of upper-outer quadrant of left breast in female, estrogen receptor positive ([place]) Staging form: Breast, AJCC 8th Edition - Clinical stage from 01/18/2017: Stage IB (cT2, cN0, cM0, G2, ER: Positive, PR: Positive, HER2: Negative) - Unsigned - Pathologic stage from 02/23/2017: Stage IB (pT2, pN0(sn), cM0, G3, ER: Positive, PR: Positive, HER2: Negative) - Signed by Alla Feeling, NP on 03/16/2017     Carcinoma of upper-outer quadrant of left breast in female, estrogen receptor positive ([place])  01/13/2017 Mammogram   FINDINGS: In the left breast, a spiculated mass lies in the upper outer quadrant. In the medial posterior left breast, there is a larger lobulated mass. These masses correspond to the palpable abnormalities. No other discrete left breast masses.  In the right breast, there are no discrete masses.  There are no areas of architectural distortion.  In both breasts there are scattered calcifications, rounded punctate, with a few other larger coarse dystrophic calcifications, without significant change from the previous screening mammogram, which is dated 12/04/2009.  Mammographic images were processed with CAD.  On physical exam, there is a firm palpable mass in the upper outer left breast and another firm mass in the medial left breast.    01/13/2017 Breast US   Targeted ultrasound is performed, showing an irregular hypoechoic shadowing mass in the left breast at the 2:30 o'clock position, 5 cm the nipple, measuring 3.3 x 2.8 x 2.6 cm. In the 10:30 o'clock position of the left breast, 5 cm the nipple, there is irregular hypoechoic mass with somewhat more lobulated margins, corresponding to the lobulated mass seen mammographically. This measures 4.8 x 2.9 x 4.8 cm. Both masses show internal vascularity on color Doppler analysis.  Sonographic evaluation of the left axilla shows several nodes with thickened cortices. Status cortex measured is 5 mm. None of these lymph nodes appear enlarged but overall size criteria.  IMPRESSION: 1. Two masses in the left breast, 1 at the 2:30 o'clock position in the other at the 10:30 o'clock position, both highly suspicious for breast carcinoma. 2. Several abnormal left axillary lymph nodes with thickened cortices. 3. No evidence of right breast malignancy.    01/18/2017 Pathology Results   Diagnosis 1. Breast, left, needle core biopsy, 2:30 o'clock - INVASIVE MAMMARY CARCINOMA - MAMMARY CARCINOMA IN-SITU - SEE COMMENT 2. Lymph node, needle/core biopsy, left axilla - NO CARCINOMA IDENTIFIED - SEE COMMENT 3. Breast, left, needle core biopsy, 10:30 o'clock - INVASIVE DUCTAL CARCINOMA - SEE COMMENT  1. PROGNOSTIC INDICATORS Results: IMMUNOHISTOCHEMICAL  AND MORPHOMETRIC ANALYSIS PERFORMED MANUALLY Estrogen Receptor: 100%,  POSITIVE, STRONG STAINING INTENSITY Progesterone Receptor: 60%, POSITIVE, STRONG STAINING INTENSITY Proliferation Marker Ki67: 60%  1. FLUORESCENCE IN-SITU HYBRIDIZATION Results: HER2 - NEGATIVE RATIO OF HER2/CEP17 SIGNALS 1.28 AVERAGE HER2 COPY NUMBER PER CELL 2.75  3. PROGNOSTIC INDICATORS Results: IMMUNOHISTOCHEMICAL AND MORPHOMETRIC ANALYSIS PERFORMED MANUALLY Estrogen Receptor: 100%, POSITIVE, STRONG STAINING INTENSITY Progesterone Receptor: 50%, POSITIVE, STRONG STAINING INTENSITY Proliferation Marker Ki67: 70%  3. FLUORESCENCE IN-SITU HYBRIDIZATION Results: HER2 - **POSITIVE** RATIO OF HER2/CEP17 SIGNALS 2.80 AVERAGE HER2 COPY NUMBER PER CELL 6.15  Microscopic Comment 1. The biopsy material shows an infiltrative proliferation of cells with large vesicular nuclei with inconspicuous nucleoli, arranged linearly and in small clusters. Based on the biopsy, the carcinoma appears Nottingham grade 2 of 3 and measures 0.9 cm in greatest linear extent. E-cadherin and prognostic markers (ER/PR/ki-67/HER2-FISH)are pending and will be reported in an addendum. Dr. Lyndon Code reviewed the case and agrees with the above diagnosis.   02/10/2017 Initial Diagnosis   Carcinoma of upper-outer quadrant of left breast in female, estrogen receptor positive ([place])   02/19/2017 Imaging   Bone Scan whole Body 02/19/17 IMPRESSION: Negative for evidence of bony metastatic disease.   02/19/2017 Imaging   CT CAP W Contrast 02/19/17 IMPRESSION: 1. Small right middle lobe pulmonary nodules up to 0.8 cm, nonspecific. Non-contrast chest CT at 3-6 months is recommended. If the nodules are stable at time of repeat CT, then future CT at 18-24 months (from today's scan) is considered optional for low-risk patients, but is recommended for high-risk patients. This recommendation follows the consensus statement: Guidelines for Management of Incidental Pulmonary Nodules Detected on CT Images: From the Fleischner  Society 2017; Radiology 2017; 284:228-243. 2. Subcentimeter hepatic lesions likely cysts but incompletely characterized due to small size. Attention on follow-up imaging recommended. 3. 2.1 cm fusiform right common iliac artery aneurysm. Continued surveillance recommended. 4. Aortic Atherosclerosis (ICD10-I70.0) and Emphysema (ICD10-J43.9).   02/23/2017 Pathology Results   Diagnosis 1. Breast, simple mastectomy, Left Total - INVASIVE DUCTAL CARCINOMA, MULTIFOCAL, NOTTINGHAM GRADE 3/3 (5.3 CM, 3.5 CM) - INVASIVE CARCINOMA INVOLVES THE DERMIS - DUCTAL CARCINOMA IN SITU, INTERMEDIATE GRADE - HYALINIZED FIBROADENOMA - MARGINS UNINVOLVED BY CARCINOMA - NO CARCINOMA IDENTIFIED IN TWO LYMPH NODES (0/2) - SEE ONCOLOGY TABLE AND COMMENT BELOW 2. Lymph node, sentinel, biopsy, Left Axillary - NO CARCINOMA IDENTIFIED IN ONE LYMPH NODE (0/1) 3. Lymph node, sentinel, biopsy - NO CARCINOMA IDENTIFIED IN ONE LYMPH NODE (0/1) 4. Lymph node, sentinel, biopsy - NO CARCINOMA IDENTIFIED IN ONE LYMPH NODE (0/1) 5. Lymph node, sentinel, biopsy - NO CARCINOMA IDENTIFIED IN ONE LYMPH NODE (0/1) Microscopic Comment 1. BREAST, INVASIVE TUMOR Procedure: Simple mastectomy Laterality: Left Tumor Size: 5. 3 cm, 3.5 cm Histologic Type: Invasive carcinoma of no special type (ductal, not otherwise specified) Grade: Nottingham Grade 3 Tubular Differentiation: 3 Nuclear Pleomorphism: 3 Mitotic Count: 2 Ductal Carcinoma in Situ (DCIS): Present Extent of Tumor: Skin: Invasive carcinoma directly invades into the dermis or epidermis without skin ulceration Margins: Invasive carcinoma, distance from closest margin: 1.5 cm (posterior) DCIS, distance from closest margin: > 1 cm Regional Lymph Nodes: Number of Lymph Nodes Examined: 6 Number of Sentinel Lymph Nodes Examined: 4 Lymph Nodes with Macrometastases: 0 Lymph Nodes with Micrometastases: 0 Lymph Nodes with Isolated Tumor Cells: 0 Treatment effect: No known  presurgical therapy Breast Prognostic Profile: See Also ([id number]) Estrogen Receptor: Positive (100%, strong); Positive (100%, strong) Progesterone Receptor: Positive (50%, strong); Positive (50%, strong) Her2: Positive (Ratio 2.80);  Negative (Ratio 1.28) Ki-67: 70%; 60% Best tumor block for sendout testing: 1E Pathologic Stage Classification (pTNM, AJCC 8th Edition): Primary Tumor: mpT3 Regional Lymph Nodes: pN0 COMMENT: The two invasive carcinomas in the breast have slightly different morphologies. The larger lesion has a papillary and micropapillary pattern admixed with typical ductal carcinoma while the smaller lesion is more typical of a ductal lesion with linear arrays (E-cadherin performed on biopsy). There are foci within the larger lesion that are concerning for lymphovascular space invasion.    04/07/2017 - 04/27/2018 Chemotherapy   weekly taxol and herceptin x12 weeks, 04/07/17-06/23/17. Then herceptin q3 weeks for total 1 year which was switched to [place] on 07/14/17. Stopped after 2 doses due to worsening neuropathy. Switched back to maintenance Herceptin started 8/12019. Completed on 04/27/18.    07/20/2017 - 09/03/2017 Radiation Therapy   She started adjuvant radiation on 07/20/17 with Dr. Lisbeth Renshaw and plans to complete on 09/03/17.    10/2017 -  Anti-estrogen oral therapy   Anastrozole 59m daily starting 10/2017   09/27/2018 Procedure   She had her PAC removed on 09/27/18.       CURRENT THERAPY:  Anastrozole 123mdaily starting 10/2017  INTERVAL HISTORY:  Penny GRALLs here for a follow up of left breast cancer. She was last seen by me 6 months ago. She presents to the clinic with family member. She notes she is doing well. She notes the bottom of her foot still feels like steel plate from numbness and tingling. She notes her toes are tender. She has numbness in her fingers. She notes over time her neuropathy has improved. She [place] She notes this has helped her feet some. She notes she will walk for 1-2 miles some times and her balance has improved. She notes she uses cane as needed. She denies recent fall. She notes she has been constipated lately and feels this is related to a medication. I reviewed her medication list with her. She notes she takes Colase and eats prunes and vegetables.  She notes she is tolerating anastrozole. She notes she is on Fosamax and tolerating well.  She notes occasional chest pain that will be severe at times. This is shooting pain that will last a few seconds before resolving. She notes this is manageable.    REVIEW OF SYSTEMS:   Constitutional: Denies fevers, chills or abnormal weight loss Eyes: Denies blurriness of vision Ears, nose, mouth, throat, and face: Denies mucositis or sore throat Respiratory: Denies cough, dyspnea or wheezes Cardiovascular: Denies palpitation, chest discomfort or lower extremity swelling Gastrointestinal:  Denies nausea, heartburn (+) Constipation Skin: Denies abnormal skin rashes Lymphatics: Denies new lymphadenopathy or easy bruising Neurological: (+) Improved tingling and numbness in feet and numbness in her fingers. Behavioral/Psych: Mood is stable, no new changes  Breast: (+) Chest shooting pain  All other systems were reviewed with the patient and are negative.  MEDICAL HISTORY:  Past Medical History:  Diagnosis Date  . Arthritis    "maybe a little in my left knee" (02/23/2017)  . Asthmatic bronchitis   . [place]  . Coronary atherosclerosis    Minor nonobstructive CAD at cardiac catheterization 2009  . Dyslipidemia   . Femoral bruit 7/08   With normal ABIs  . Hypertension   . [place] artery [place]  . Lower extremity edema    Chronic  . Pneumonia ~ 1950   in 8th  grade  . Type II diabetes mellitus ([place])     SURGICAL HISTORY: Past Surgical History:  Procedure Laterality Date  .  APPENDECTOMY    . BREAST BIOPSY Left 02/2017  . CARDIAC CATHETERIZATION     "long time ago" (02/23/2017)  . MASTECTOMY COMPLETE / SIMPLE W/ SENTINEL NODE BIOPSY Left 02/23/2017   TOTAL MATECTOMY;  LEFT AXILLARY SENTINEL LYMPH NODE BIOPSY ERAS PATHWAY  . MASTECTOMY WITH RADIOACTIVE SEED GUIDED EXCISION AND AXILLARY SENTINEL LYMPH NODE BIOPSY Left 02/23/2017   Procedure: LEFT TOTAL MATECTOMY;  LEFT AXILLARY SENTINEL LYMPH NODE BIOPSY ERAS PATHWAY;  Surgeon: Excell Seltzer, MD;  Location: [facility];  Service: General;  Laterality: Left;  . PILONIDAL CYST EXCISION    . PORT-A-CATH REMOVAL Right 09/27/2018   Procedure: REMOVAL PORT-A-CATH;  Surgeon: Fanny Skates, MD;  Location: [facility];  Service: General;  Laterality: Right;  . PORTA CATH INSERTION  02/23/2017  . PORTACATH PLACEMENT N/A 02/23/2017   Procedure: INSERTION PORT-A-CATH;  Surgeon: Excell Seltzer, MD;  Location: [facility];  Service: General;  Laterality: N/A;  . SHOULDER ARTHROSCOPY W/ ROTATOR CUFF REPAIR Right     I have reviewed the social history and family history with the patient and they are unchanged from previous note.  ALLERGIES:  has No Known Allergies.  MEDICATIONS:  Current Outpatient Medications  Medication Sig Dispense Refill  . albuterol (VENTOLIN HFA) 108 (90 Base) MCG/ACT inhaler Inhale into the lungs every 6 (six) hours as needed for wheezing or shortness of breath.    Marland Kitchen alendronate (FOSAMAX) 70 MG tablet Take 70 mg by mouth once a week.    Marland Kitchen amLODipine (NORVASC) 5 MG tablet Take 1 tablet (5 mg total) by mouth daily. 90 tablet 3  . anastrozole (ARIMIDEX) 1 MG tablet Take 1 tablet (1 mg total) by mouth daily. 90 tablet 3  . aspirin 81 MG EC tablet Take 81 mg by mouth at bedtime.     . chlorthalidone (HYGROTON) 25 MG tablet Take 25 mg by mouth daily.     . Coenzyme Q10 (COQ10) 100 MG CAPS Take 100 mg by mouth daily.      Marland Kitchen losartan (COZAAR) 100 MG tablet Take 100 mg by mouth daily.    . metFORMIN  (GLUCOPHAGE) 500 MG tablet Take 500 mg by mouth daily with breakfast.     . pregabalin (LYRICA) 150 MG capsule TAKE ONE CAPSULE BY MOUTH TWICE A DAY 60 capsule 3  . rosuvastatin (CRESTOR) 5 MG tablet Take 5 mg by mouth every other day.    . vitamin B-12 (CYANOCOBALAMIN) 1000 MCG tablet Take 1,000 mcg by mouth daily.    . Vitamin D, Cholecalciferol, 25 MCG (1000 UT) CAPS Take 1 capsule by mouth daily.      No current facility-administered medications for this visit.    PHYSICAL EXAMINATION: ECOG PERFORMANCE STATUS: 1 - Symptomatic but completely ambulatory  Vitals:   04/08/20 0947  BP: 138/71  Pulse: 94  Resp: 18  Temp: (!) 97.5 F (36.4 C)  SpO2: 100%   Filed Weights   04/08/20 0947  Weight: 166 lb 4.8 oz (75.4 kg)    GENERAL:alert, no distress and comfortable SKIN: skin color, texture, turgor are normal, no rashes or significant lesions (+) Multiple skin moles across chest  EYES: normal, Conjunctiva are pink and non-injected, sclera clear  NECK: supple, thyroid normal size, non-tender, without nodularity LYMPH:  no palpable lymphadenopathy in the cervical, axillary  LUNGS: clear to auscultation and percussion with normal breathing  effort HEART: regular rate & rhythm and no murmurs and no lower extremity edema ABDOMEN:abdomen soft, non-tender and normal bowel sounds Musculoskeletal:no cyanosis of digits and no clubbing  NEURO: alert & oriented x 3 with fluent speech, no focal motor/sensory deficits BREAST: S/p left mastectomy: surgical incision healed well with scar tissue (+) Mild soft tissue fullness in lateral left breast/axilla. (+) Skin darkening of left breast from RT. Right Breast exam benign.   LABORATORY DATA:  I have reviewed the data as listed CBC Latest Ref Rng & Units 04/08/2020 10/06/2019 04/05/2019  WBC 4.0 - 10.5 K/uL 3.2(L) 4.1 3.1(L)  Hemoglobin 12.0 - 15.0 g/dL 12.0 12.4 12.7  Hematocrit 36.0 - 46.0 % 36.3 38.1 39.1  Platelets 150 - 400 K/uL 244 215 254      CMP Latest Ref Rng & Units 04/08/2020 10/06/2019 04/05/2019  Glucose 70 - 99 mg/dL 110(H) 111(H) 175(H)  BUN 8 - 23 mg/dL 15 13 7(L)  Creatinine 0.44 - 1.00 mg/dL 0.83 0.75 0.79  Sodium 135 - 145 mmol/L 131(L) 130(L) 131(L)  Potassium 3.5 - 5.1 mmol/L 3.5 3.7 4.2  Chloride 98 - 111 mmol/L 97(L) 95(L) 95(L)  CO2 22 - 32 mmol/L _0 Calcium 8.9 - 10.3 mg/dL 9.6 9.7 9.4  Total Protein 6.5 - 8.1 g/dL 7.5 7.0 7.1  Total Bilirubin 0.3 - 1.2 mg/dL 1.0 0.6 0.6  Alkaline Phos 38 - 126 U/L 41 46 78  AST 15 - 41 U/L _1 ALT 0 - 44 U/L _2 RADIOGRAPHIC STUDIES: I have personally reviewed the radiological images as listed and agreed with the findings in the report. No results found.   ASSESSMENT & PLAN:  SHARONANN MALBROUGH is a 87 y.o. female with   1.Multifocal invasive ductal carcinoma of the left breast of female;carcinoma of the upper-outer quadrant of left breast and female, ER/PR positive, HER-2 negative pT2N0M0, G2, Stage 1B; IDC of the upper-inner quadrant left breast, ER+/PR+/HER2+, pT3N0M0 G3, stage IB -Diagnosed in12/2018. Treated withleft mastectomy, followed by adjuvantchemo with Herceptin and adjuvant radiation.  -She started antiestrogen therapy with anastrozole in 10/2017. Tolerating well overall. Will continue for 5 years -From a breast cancer standpoint, she is clinically doing well. She notes occasional left breast shooting pain and left back discomfort related to breast surgery. Lab reviewed, her CBC and CMP are within normal limits except WBC 3.2, ANC 1.5, sodium 131, BG 110. Her physical exam and her 03/2020 mammogram were unremarkable. There is no clinical concern for recurrence. -Continue surveillance. Next Mammogram will be in 03/2021 -Continue Anastrozole, she is tolerating well  -F/u in 6 months   2. Peripheral neuropathy, G2, secondary to taxol, DM and T-DM1 -Follow-up with Dr. Mickeal Skinner as needed.  -She has tried gabapentin, Amytryptilline,  Pregabalin, and Lidocaine with some improvement.  -She is currently on Lyrica and participating in neuropathy program with Chiropractor which has helped. She now has manageable numbness/tingling in her feet and numbness in fingers.  -Balance has improved and ambulated with cane as needed. No recent falls.   3. HTN, CAD, DM -f/u with PCP  4. Osteoporosis -07/2018 DEXA shows osteoporosis with lowest T-score -2.6 at left forearm radius. Repeat in 07/2020.  -She is on oral Fosamax weekly through her PCP. Continue along with Calcium and Vit d.   5. Memoryloss -Shehas developed mild expressive aphasia and memory loss in early 2021 -She no longer has home aid and only needs help getting out  of tub otherwise she is able to take care of herself at home.  -some improvement lately  -Continue to f/u with Dr. Mickeal Skinner as needed.   6. Hyponatremia -Her Sodium level mildly low at 131 today (04/08/20). I dicussed dehydration and Lyrica can contribute.  -I recommend she increase water intake and increase salt in diet.     Plan -Copy labs to PCP  -Continue anastrozole -Lab and F/u in 6 months.   No problem-specific Assessment & Plan notes found for this encounter.   No orders of the defined types were placed in this encounter.  All questions were answered. The patient knows to call the clinic with any problems, questions or concerns. No barriers to learning was detected. The total time spent in the appointment was 30 minutes.     Truitt Merle, MD 04/08/2020   I, Joslyn Devon, am acting as scribe for Truitt Merle, MD.   I have reviewed the above documentation for accuracy and completeness, and I agree with the above.

## 2020-04-08 ENCOUNTER — Telehealth: Payer: Self-pay | Admitting: Hematology

## 2020-04-08 ENCOUNTER — Encounter: Payer: Self-pay | Admitting: Hematology

## 2020-04-08 ENCOUNTER — Inpatient Hospital Stay (HOSPITAL_BASED_OUTPATIENT_CLINIC_OR_DEPARTMENT_OTHER): Payer: Medicare Other | Admitting: Hematology

## 2020-04-08 ENCOUNTER — Inpatient Hospital Stay: Payer: Medicare Other | Attending: Hematology

## 2020-04-08 ENCOUNTER — Other Ambulatory Visit: Payer: Self-pay

## 2020-04-08 VITALS — BP 138/71 | HR 94 | Temp 97.5°F | Resp 18 | Ht 64.0 in | Wt 166.3 lb

## 2020-04-08 DIAGNOSIS — Z9012 Acquired absence of left breast and nipple: Secondary | ICD-10-CM | POA: Diagnosis not present

## 2020-04-08 DIAGNOSIS — K5900 Constipation, unspecified: Secondary | ICD-10-CM | POA: Diagnosis not present

## 2020-04-08 DIAGNOSIS — Z170 Estrogen receptor positive status [ER+]: Secondary | ICD-10-CM | POA: Insufficient documentation

## 2020-04-08 DIAGNOSIS — E1042 Type 1 diabetes mellitus with diabetic polyneuropathy: Secondary | ICD-10-CM | POA: Insufficient documentation

## 2020-04-08 DIAGNOSIS — Z923 Personal history of irradiation: Secondary | ICD-10-CM | POA: Insufficient documentation

## 2020-04-08 DIAGNOSIS — C50212 Malignant neoplasm of upper-inner quadrant of left female breast: Secondary | ICD-10-CM | POA: Diagnosis not present

## 2020-04-08 DIAGNOSIS — Z79899 Other long term (current) drug therapy: Secondary | ICD-10-CM | POA: Diagnosis not present

## 2020-04-08 DIAGNOSIS — Z7983 Long term (current) use of bisphosphonates: Secondary | ICD-10-CM | POA: Insufficient documentation

## 2020-04-08 DIAGNOSIS — I10 Essential (primary) hypertension: Secondary | ICD-10-CM | POA: Insufficient documentation

## 2020-04-08 DIAGNOSIS — I2510 Atherosclerotic heart disease of native coronary artery without angina pectoris: Secondary | ICD-10-CM | POA: Insufficient documentation

## 2020-04-08 DIAGNOSIS — I723 Aneurysm of iliac artery: Secondary | ICD-10-CM | POA: Insufficient documentation

## 2020-04-08 DIAGNOSIS — R413 Other amnesia: Secondary | ICD-10-CM | POA: Insufficient documentation

## 2020-04-08 DIAGNOSIS — Z7982 Long term (current) use of aspirin: Secondary | ICD-10-CM | POA: Diagnosis not present

## 2020-04-08 DIAGNOSIS — M810 Age-related osteoporosis without current pathological fracture: Secondary | ICD-10-CM | POA: Diagnosis not present

## 2020-04-08 DIAGNOSIS — Z79811 Long term (current) use of aromatase inhibitors: Secondary | ICD-10-CM | POA: Insufficient documentation

## 2020-04-08 DIAGNOSIS — Z7984 Long term (current) use of oral hypoglycemic drugs: Secondary | ICD-10-CM | POA: Diagnosis not present

## 2020-04-08 DIAGNOSIS — C50412 Malignant neoplasm of upper-outer quadrant of left female breast: Secondary | ICD-10-CM | POA: Diagnosis not present

## 2020-04-08 LAB — CMP (CANCER CENTER ONLY)
ALT: 16 U/L (ref 0–44)
AST: 23 U/L (ref 15–41)
Albumin: 4.1 g/dL (ref 3.5–5.0)
Alkaline Phosphatase: 41 U/L (ref 38–126)
Anion gap: 7 (ref 5–15)
BUN: 15 mg/dL (ref 8–23)
CO2: 27 mmol/L (ref 22–32)
Calcium: 9.6 mg/dL (ref 8.9–10.3)
Chloride: 97 mmol/L — ABNORMAL LOW (ref 98–111)
Creatinine: 0.83 mg/dL (ref 0.44–1.00)
GFR, Estimated: >60 mL/min (ref >60)
Glucose, Bld: 110 mg/dL — ABNORMAL HIGH (ref 70–99)
Potassium: 3.5 mmol/L (ref 3.5–5.1)
Sodium: 131 mmol/L — ABNORMAL LOW (ref 135–145)
Total Bilirubin: 1 mg/dL (ref 0.3–1.2)
Total Protein: 7.5 g/dL (ref 6.5–8.1)

## 2020-04-08 LAB — CBC WITH DIFFERENTIAL (CANCER CENTER ONLY)
Abs Immature Granulocytes: 0.01 10*3/uL (ref 0.00–0.07)
Basophils Absolute: 0.1 10*3/uL (ref 0.0–0.1)
Basophils Relative: 2 %
Eosinophils Absolute: 0.1 10*3/uL (ref 0.0–0.5)
Eosinophils Relative: 3 %
HCT: 36.3 % (ref 36.0–46.0)
Hemoglobin: 12 g/dL (ref 12.0–15.0)
Immature Granulocytes: 0 %
Lymphocytes Relative: 37 %
Lymphs Abs: 1.2 10*3/uL (ref 0.7–4.0)
MCH: 29.4 pg (ref 26.0–34.0)
MCHC: 33.1 g/dL (ref 30.0–36.0)
MCV: 89 fL (ref 80.0–100.0)
Monocytes Absolute: 0.3 10*3/uL (ref 0.1–1.0)
Monocytes Relative: 10 %
Neutro Abs: 1.5 10*3/uL — ABNORMAL LOW (ref 1.7–7.7)
Neutrophils Relative %: 48 %
Platelet Count: 244 10*3/uL (ref 150–400)
RBC: 4.08 MIL/uL (ref 3.87–5.11)
RDW: 13 % (ref 11.5–15.5)
WBC Count: 3.2 10*3/uL — ABNORMAL LOW (ref 4.0–10.5)
nRBC: 0 % (ref 0.0–0.2)

## 2020-04-08 NOTE — Telephone Encounter (Signed)
Scheduled appointments per 3/7 los. Spoke to patient who is aware of appointments date and times. Gave patient calendar print out.

## 2020-04-17 ENCOUNTER — Ambulatory Visit (INDEPENDENT_AMBULATORY_CARE_PROVIDER_SITE_OTHER): Payer: Medicare Other | Admitting: Gastroenterology

## 2020-04-17 ENCOUNTER — Other Ambulatory Visit: Payer: Self-pay

## 2020-04-17 ENCOUNTER — Encounter (INDEPENDENT_AMBULATORY_CARE_PROVIDER_SITE_OTHER): Payer: Self-pay | Admitting: Gastroenterology

## 2020-04-17 DIAGNOSIS — R195 Other fecal abnormalities: Secondary | ICD-10-CM | POA: Diagnosis not present

## 2020-04-17 NOTE — Progress Notes (Signed)
Penny Huynh, M.D. Gastroenterology & Hepatology [facility] [address] [place], [place] [address] Primary Care Physician: Manon Hilding, MD [place] [id number]  Referring MD: PCP  Chief Complaint: Positive Hemosure  History of Present Illness: Penny Huynh is a 87 y.o. female with PMH breast cancer s/p mastectomy and lymphadenectomy and CRTx, non obstructrive CAD, HTN, HLD, asthma and DM, who presents for evaluation of positive Hemosure.  Patint reports she has a bowel movement every 3-4 days. She thinks her medication has led to constipation , as her constipation started in 1991. She is not taking any laxatives but is taking Colace as needed. She eats prunes and oat bread.  Overall, she feels that this has led to improvement of her symptoms and is not interested in other laxatives at the moment.The patient denies having any nausea, vomiting, fever, chills, hematochezia, melena, hematemesis, abdominal distention, abdominal pain, diarrhea, jaundice, pruritus or weight loss.  Last Hb was performed on 04/08/2020 which showed a hemoglobin of 12.0 with MCV of 89.  BUN was 0.83.  Last Colonoscopy:never.  FHx: neg for any gastrointestinal/liver disease, father prostate cancer Social: quit smoking 18 years ago, neg alcohol or illicit drug use Surgical: appendectomy  Past Medical History: Past Medical History:  Diagnosis Date  . Arthritis    "maybe a little in my left knee" (02/23/2017)  . Asthmatic bronchitis   . Cancer of left breast ([place]) 2019  . Coronary atherosclerosis    Minor nonobstructive CAD at cardiac catheterization 2009  . Dyslipidemia   . Femoral bruit 7/08   With normal ABIs  . Hypertension   . Iliac artery aneurysm ([place])   . Lower extremity edema    Chronic  . Pneumonia ~ 1950   in 8th grade  . Type II diabetes mellitus ([place])     Past Surgical History: Past Surgical History:  Procedure  Laterality Date  . APPENDECTOMY    . BREAST BIOPSY Left 02/2017  . CARDIAC CATHETERIZATION     "long time ago" (02/23/2017)  . MASTECTOMY COMPLETE / SIMPLE W/ SENTINEL NODE BIOPSY Left 02/23/2017   TOTAL MATECTOMY;  LEFT AXILLARY SENTINEL LYMPH NODE BIOPSY ERAS PATHWAY  . MASTECTOMY WITH RADIOACTIVE SEED GUIDED EXCISION AND AXILLARY SENTINEL LYMPH NODE BIOPSY Left 02/23/2017   Procedure: LEFT TOTAL MATECTOMY;  LEFT AXILLARY SENTINEL LYMPH NODE BIOPSY ERAS PATHWAY;  Surgeon: Excell Seltzer, MD;  Location: [facility];  Service: General;  Laterality: Left;  . PILONIDAL CYST EXCISION    . PORT-A-CATH REMOVAL Right 09/27/2018   Procedure: REMOVAL PORT-A-CATH;  Surgeon: Fanny Skates, MD;  Location: [facility];  Service: General;  Laterality: Right;  . PORTA CATH INSERTION  02/23/2017  . PORTACATH PLACEMENT N/A 02/23/2017   Procedure: INSERTION PORT-A-CATH;  Surgeon: Excell Seltzer, MD;  Location: [facility];  Service: General;  Laterality: N/A;  . SHOULDER ARTHROSCOPY W/ ROTATOR CUFF REPAIR Right     Family History: Family History  Problem Relation Age of Onset  . Diabetes Mother   . Stroke Father   . Cancer Father        unknown type     Social History: Social History   Tobacco Use  Smoking Status Former Smoker  . Packs/day: 2.00  . Years: 46.00  . Pack years: 92.00  . Types: Cigarettes  . Start date: 11/09/1953  . Quit date: 2000  . Years since quitting: 22.2  Smokeless Tobacco Never Used   Social  History   Substance and Sexual Activity  Alcohol Use Yes  . Alcohol/week: 2.0 standard drinks  . Types: 2 Glasses of wine per week   Comment: social   Social History   Substance and Sexual Activity  Drug Use No    Allergies: No Known Allergies  Medications: Current Outpatient Medications  Medication Sig Dispense Refill  . albuterol (VENTOLIN HFA) 108 (90 Base) MCG/ACT inhaler Inhale into the lungs every 6 (six) hours as needed for wheezing or shortness of  breath.    Marland Kitchen alendronate (FOSAMAX) 70 MG tablet Take 70 mg by mouth once a week.    Marland Kitchen amLODipine (NORVASC) 5 MG tablet Take 1 tablet (5 mg total) by mouth daily. 90 tablet 3  . anastrozole (ARIMIDEX) 1 MG tablet Take 1 tablet (1 mg total) by mouth daily. 90 tablet 3  . aspirin 81 MG EC tablet Take 81 mg by mouth at bedtime.    . chlorthalidone (HYGROTON) 25 MG tablet Take 25 mg by mouth daily.    . Coenzyme Q10 (COQ10) 100 MG CAPS Take 100 mg by mouth daily.    Marland Kitchen losartan (COZAAR) 100 MG tablet Take 100 mg by mouth daily.    . metFORMIN (GLUCOPHAGE) 500 MG tablet Take 1,000 mg by mouth daily with breakfast.    . pregabalin (LYRICA) 150 MG capsule TAKE ONE CAPSULE BY MOUTH TWICE A DAY 60 capsule 3  . rosuvastatin (CRESTOR) 5 MG tablet Take 5 mg by mouth every other day.    . vitamin B-12 (CYANOCOBALAMIN) 1000 MCG tablet Take 1,000 mcg by mouth daily.    . Vitamin D, Cholecalciferol, 25 MCG (1000 UT) CAPS Take 1 capsule by mouth daily.      No current facility-administered medications for this visit.    Review of Systems: GENERAL: negative for malaise, night sweats HEENT: No changes in hearing or vision, no nose bleeds or other nasal problems. NECK: Negative for lumps, goiter, pain and significant neck swelling RESPIRATORY: Negative for cough, wheezing CARDIOVASCULAR: Negative for chest pain, leg swelling, palpitations, orthopnea GI: SEE HPI MUSCULOSKELETAL: Negative for joint pain or swelling, back pain, and muscle pain. SKIN: Negative for lesions, rash PSYCH: Negative for sleep disturbance, mood disorder and recent psychosocial stressors. HEMATOLOGY Negative for prolonged bleeding, bruising easily, and swollen nodes. ENDOCRINE: Negative for cold or heat intolerance, polyuria, polydipsia and goiter. NEURO: negative for tremor, gait imbalance, syncope and seizures. The remainder of the review of systems is noncontributory.   Physical Exam: BP (!) 169/73 (BP Location: Left Arm, Patient  Position: Sitting, Cuff Size: Large)   Pulse 80   Temp 99 F (37.2 C) (Oral)   Ht 5\' 4"  (1.626 m)   Wt 170 lb (77.1 kg)   BMI 29.18 kg/m  GENERAL: The patient is AO x3, in no acute distress. HEENT: Head is normocephalic and atraumatic. EOMI are intact. Mouth is well hydrated and without lesions. NECK: Supple. No masses LUNGS: Clear to auscultation. No presence of rhonchi/wheezing/rales. Adequate chest expansion HEART: RRR, normal s1 and s2. ABDOMEN: Soft, nontender, no guarding, no peritoneal signs, and nondistended. BS +. No masses. EXTREMITIES: Without any cyanosis, clubbing, rash, lesions or edema. NEUROLOGIC: AOx3, no focal motor deficit. SKIN: no jaundice, no rashes   Imaging/Labs: as above  I personally reviewed and interpreted the available labs, imaging and endoscopic files.  Impression and Plan: Penny Huynh is a 87 y.o. female with PMH breast cancer s/p mastectomy and lymphadenectomy and CRTx, non obstructrive CAD, HTN, HLD, asthma  and DM, who presents for evaluation of positive Hemosure.  The patient was found to have positive blood in her stool.  I explained to the patient that this test could be positive due to multiple reasons but I explained her that we will need to proceed with a colonoscopy as she has never had 1 and presence of polyps or malignancy could cause this test to be positive. More than 50% of the office visit was dedicated to discussing the procedure, including the day of and risks involved. Patient understands what the procedure involves including the benefits and any risks. Patient understands alternatives to the proposed procedure. Risks including (but not limited to) bleeding, tearing of the lining (perforation), rupture of adjacent organs, problems with heart and lung function, infection, and medication reactions. A small percentage of complications may require surgery, hospitalization, repeat endoscopic procedure, and/or transfusion. A small percentage  of polyps and other tumors may not be seen. Patient understood and agreed.  - Schedule colonoscopy  All questions were answered.      Penny Peppers, MD Gastroenterology and Hepatology [facility]

## 2020-04-17 NOTE — Patient Instructions (Signed)
Schedule colonoscopy

## 2020-05-02 ENCOUNTER — Telehealth: Payer: Self-pay

## 2020-05-02 NOTE — Telephone Encounter (Signed)
Penny Huynh called requesting prescription for SAS shoe for her peripheral neuropathy.  Rx completed and mailed to patient.

## 2020-05-08 DIAGNOSIS — E119 Type 2 diabetes mellitus without complications: Secondary | ICD-10-CM | POA: Diagnosis not present

## 2020-05-08 DIAGNOSIS — H04123 Dry eye syndrome of bilateral lacrimal glands: Secondary | ICD-10-CM | POA: Diagnosis not present

## 2020-05-08 DIAGNOSIS — H401131 Primary open-angle glaucoma, bilateral, mild stage: Secondary | ICD-10-CM | POA: Diagnosis not present

## 2020-05-08 DIAGNOSIS — Z961 Presence of intraocular lens: Secondary | ICD-10-CM | POA: Diagnosis not present

## 2020-05-21 DIAGNOSIS — J069 Acute upper respiratory infection, unspecified: Secondary | ICD-10-CM | POA: Diagnosis not present

## 2020-07-04 DIAGNOSIS — Z6830 Body mass index (BMI) 30.0-30.9, adult: Secondary | ICD-10-CM | POA: Diagnosis not present

## 2020-07-04 DIAGNOSIS — E7849 Other hyperlipidemia: Secondary | ICD-10-CM | POA: Diagnosis not present

## 2020-07-04 DIAGNOSIS — E1165 Type 2 diabetes mellitus with hyperglycemia: Secondary | ICD-10-CM | POA: Diagnosis not present

## 2020-07-04 DIAGNOSIS — C50919 Malignant neoplasm of unspecified site of unspecified female breast: Secondary | ICD-10-CM | POA: Diagnosis not present

## 2020-07-04 DIAGNOSIS — Z87891 Personal history of nicotine dependence: Secondary | ICD-10-CM | POA: Diagnosis not present

## 2020-07-04 DIAGNOSIS — R0600 Dyspnea, unspecified: Secondary | ICD-10-CM | POA: Diagnosis not present

## 2020-07-04 DIAGNOSIS — I700 Atherosclerosis of aorta: Secondary | ICD-10-CM | POA: Diagnosis not present

## 2020-07-04 DIAGNOSIS — R0602 Shortness of breath: Secondary | ICD-10-CM | POA: Diagnosis not present

## 2020-07-11 DIAGNOSIS — Z23 Encounter for immunization: Secondary | ICD-10-CM | POA: Diagnosis not present

## 2020-07-15 DIAGNOSIS — E782 Mixed hyperlipidemia: Secondary | ICD-10-CM | POA: Diagnosis not present

## 2020-07-15 DIAGNOSIS — E876 Hypokalemia: Secondary | ICD-10-CM | POA: Diagnosis not present

## 2020-07-15 DIAGNOSIS — E119 Type 2 diabetes mellitus without complications: Secondary | ICD-10-CM | POA: Diagnosis not present

## 2020-07-15 DIAGNOSIS — R5383 Other fatigue: Secondary | ICD-10-CM | POA: Diagnosis not present

## 2020-07-15 DIAGNOSIS — E7849 Other hyperlipidemia: Secondary | ICD-10-CM | POA: Diagnosis not present

## 2020-07-26 DIAGNOSIS — I10 Essential (primary) hypertension: Secondary | ICD-10-CM | POA: Diagnosis not present

## 2020-07-26 DIAGNOSIS — E871 Hypo-osmolality and hyponatremia: Secondary | ICD-10-CM | POA: Diagnosis not present

## 2020-07-26 DIAGNOSIS — C50919 Malignant neoplasm of unspecified site of unspecified female breast: Secondary | ICD-10-CM | POA: Diagnosis not present

## 2020-07-26 DIAGNOSIS — R252 Cramp and spasm: Secondary | ICD-10-CM | POA: Diagnosis not present

## 2020-07-26 DIAGNOSIS — E7849 Other hyperlipidemia: Secondary | ICD-10-CM | POA: Diagnosis not present

## 2020-07-26 DIAGNOSIS — E1165 Type 2 diabetes mellitus with hyperglycemia: Secondary | ICD-10-CM | POA: Diagnosis not present

## 2020-07-26 DIAGNOSIS — M542 Cervicalgia: Secondary | ICD-10-CM | POA: Diagnosis not present

## 2020-07-26 DIAGNOSIS — E1140 Type 2 diabetes mellitus with diabetic neuropathy, unspecified: Secondary | ICD-10-CM | POA: Diagnosis not present

## 2020-08-04 ENCOUNTER — Other Ambulatory Visit: Payer: Self-pay | Admitting: Internal Medicine

## 2020-08-20 ENCOUNTER — Other Ambulatory Visit: Payer: Self-pay

## 2020-08-20 ENCOUNTER — Ambulatory Visit (INDEPENDENT_AMBULATORY_CARE_PROVIDER_SITE_OTHER): Payer: Medicare Other | Admitting: Pulmonary Disease

## 2020-08-20 ENCOUNTER — Encounter: Payer: Self-pay | Admitting: Pulmonary Disease

## 2020-08-20 VITALS — BP 126/82 | HR 63 | Ht 64.0 in | Wt 167.4 lb

## 2020-08-20 DIAGNOSIS — R0600 Dyspnea, unspecified: Secondary | ICD-10-CM | POA: Diagnosis not present

## 2020-08-20 DIAGNOSIS — R0609 Other forms of dyspnea: Secondary | ICD-10-CM

## 2020-08-20 DIAGNOSIS — J4520 Mild intermittent asthma, uncomplicated: Secondary | ICD-10-CM

## 2020-08-20 NOTE — Patient Instructions (Addendum)
Nice to meet you  I am glad the breathing is better  I wonder if the episode was related to asthma  We will get breathing or pulmonary function tests at time of next visit  Follow up with Dr. Silas Flood in 2 months

## 2020-08-20 NOTE — Progress Notes (Signed)
@Patient  ID: Penny Huynh, female    DOB: 11/24/1933, 87 y.o.   MRN: [id number]  Chief Complaint  Patient presents with   Consult    Referred by PCP for DOE for the past 3-4 months. States the DOE has improved slightly. When it first started, she was not able to walk around her house without feeling exhausted. Denies ever being placed on O2.     Referring provider: Manon Hilding, MD  HPI:   87 year old woman whom we are seeing in consultation for evaluation of dyspnea on exertion.  PCP note reviewed.  She reports onset of dyspnea on exertion about 3 to 4 months ago.  Lasted a few weeks.  Significant dyspnea walking around the house.  Needed to rest from room to room.  Big change from baseline.  Often walks outside under a daily basis.  1 to 2 miles.  No intervention.  Reported no chest imaging at that time.  Not tried any medicines.  Symptoms self resolved in a few weeks.  She is back to walking.  Very active.  Cannot walk quite as far she could 6 months ago.  Denies any significant dyspnea.  No shortness of breath.  Thinks things are essentially 100% normal.  Again, she cannot identify any alleviating or exacerbating factors of the time of dyspnea.  She did not have significant cough that is typical of her asthmatic bronchitis flares in the past.  Denies any sore throat, cough, fever-no respiratory infection symptoms.  Did not try any medications.  Does not think pollens contribute much although looking back at it was during the spring so is possible.  PMH: Hypertension, asthma, diabetes, breast cancer in remission Surgical history: Breast surgery 02/2017, appendectomy 1955 Family history: History of cancer first-degree relatives, denies respiratory issues in first-degree relatives Social history: Former smoker, 90+ pack year smoking history, quit in the 32s   Questionaires / Pulmonary Flowsheets:   ACT:  No flowsheet data found.  MMRC: No flowsheet data found.  Epworth:  No  flowsheet data found.  Tests:   FENO:  No results found for: NITRICOXIDE  PFT: No flowsheet data found.  WALK:  No flowsheet data found.  Imaging: Personally reviewed and as per EMR discussion this note  Lab Results: Personally reviewed, no anemia CBC    Component Value Date/Time   WBC 3.2 (L) 04/08/2020 0907   WBC 4.7 02/24/2017 0714   RBC 4.08 04/08/2020 0907   HGB 12.0 04/08/2020 0907   HCT 36.3 04/08/2020 0907   PLT 244 04/08/2020 0907   MCV 89.0 04/08/2020 0907   MCH 29.4 04/08/2020 0907   MCHC 33.1 04/08/2020 0907   RDW 13.0 04/08/2020 0907   LYMPHSABS 1.2 04/08/2020 0907   MONOABS 0.3 04/08/2020 0907   EOSABS 0.1 04/08/2020 0907   BASOSABS 0.1 04/08/2020 0907    BMET    Component Value Date/Time   NA 131 (L) 04/08/2020 0907   K 3.5 04/08/2020 0907   CL 97 (L) 04/08/2020 0907   CO2 27 04/08/2020 0907   GLUCOSE 110 (H) 04/08/2020 0907   BUN 15 04/08/2020 0907   CREATININE 0.83 04/08/2020 0907   CALCIUM 9.6 04/08/2020 0907   GFRNONAA >60 04/08/2020 0907   GFRAA >60 10/06/2019 1014    BNP No results found for: BNP  ProBNP No results found for: PROBNP  Specialty Problems       Pulmonary Problems   DYSPNEA    Qualifier: Diagnosis of  By: Dannielle Burn, MD, [facility],  [place]          No Known Allergies  Immunization History  Administered Date(s) Administered   Moderna Sars-Covid-2 Vaccination 02/14/2019, 03/27/2019, 12/09/2019, 07/11/2020    Past Medical History:  Diagnosis Date   Arthritis    "maybe a little in my left knee" (02/23/2017)   Asthmatic bronchitis    Cancer of left breast ([place]) 2019   Coronary atherosclerosis    Minor nonobstructive CAD at cardiac catheterization 2009   Dyslipidemia    Femoral bruit 7/08   With normal ABIs   Hypertension    Iliac artery aneurysm ([facility])    Lower extremity edema    Chronic   Pneumonia ~ 1950   in 8th grade   Type II diabetes mellitus ([facility])     Tobacco History: Social History   Tobacco Use   Smoking Status Former   Packs/day: 2.00   Years: 46.00   Pack years: 92.00   Types: Cigarettes   Start date: 11/09/1953   Quit date: 02/03/1991   Years since quitting: 29.5  Smokeless Tobacco Never   Counseling given: Not Answered   Continue to not smoke  Outpatient Encounter Medications as of 08/20/2020  Medication Sig   albuterol (VENTOLIN HFA) 108 (90 Base) MCG/ACT inhaler Inhale into the lungs every 6 (six) hours as needed for wheezing or shortness of breath.   alendronate (FOSAMAX) 70 MG tablet Take 70 mg by mouth once a week.   anastrozole (ARIMIDEX) 1 MG tablet Take 1 tablet (1 mg total) by mouth daily.   aspirin 81 MG EC tablet Take 81 mg by mouth at bedtime.   chlorthalidone (HYGROTON) 25 MG tablet Take 25 mg by mouth daily.   Coenzyme Q10 (COQ10) 100 MG CAPS Take 100 mg by mouth daily.   losartan (COZAAR) 100 MG tablet Take 100 mg by mouth daily.   metFORMIN (GLUCOPHAGE) 500 MG tablet Take 1,000 mg by mouth daily with breakfast.   pregabalin (LYRICA) 150 MG capsule TAKE ONE CAPSULE BY MOUTH TWICE A DAY   rosuvastatin (CRESTOR) 5 MG tablet Take 5 mg by mouth every other day.   vitamin B-12 (CYANOCOBALAMIN) 1000 MCG tablet Take 1,000 mcg by mouth daily.   Vitamin D, Cholecalciferol, 25 MCG (1000 UT) CAPS Take 1 capsule by mouth daily.    amLODipine (NORVASC) 5 MG tablet Take 1 tablet (5 mg total) by mouth daily.   No facility-administered encounter medications on file as of 08/20/2020.     Review of Systems  Review of Systems  No chest pain with exertion.  No orthopnea or PND.  No lower extremity swelling.  No weight gain or weight loss.  Comprehensive review of systems otherwise negative. Physical Exam  BP 126/82   Pulse 63   Ht 5\' 4"  (1.626 m)   Wt 167 lb 6.4 oz (75.9 kg)   SpO2 99% Comment: on RA  BMI 28.73 kg/m   Wt Readings from Last 5 Encounters:  08/20/20 167 lb 6.4 oz (75.9 kg)  04/17/20 170 lb (77.1 kg)  04/08/20 166 lb 4.8 oz (75.4 kg)  10/06/19 174  lb 6.4 oz (79.1 kg)  06/29/19 170 lb (77.1 kg)    BMI Readings from Last 5 Encounters:  08/20/20 28.73 kg/m  04/17/20 29.18 kg/m  04/08/20 28.55 kg/m  10/06/19 29.94 kg/m  06/29/19 29.18 kg/m     Physical Exam General: Well-appearing, no acute distress Eyes: EOMI, no icterus Neck: Supple, no JVP appreciated Cardiovascular: Regular rate and rhythm, no murmurs Pulmonary: Clear  to auscultation bilaterally, no wheezing, normal work of breathing Abdomen: Nondistended, bowel sounds present MSK: No synovitis, no joint effusion Neuro: Normal gait, no weakness Psych: Normal mood, full affect   Assessment & Plan:   Dyspnea on exertion: Now resolved.  Lasted for few weeks.  Suspect related to asthma given her history of "asthmatic bronchitis."  High suspicion for this given resolution with 0 intervention.  She denies any symptoms consistent with infection, bacterial or viral at the time.  She denies any concerning cardiac symptoms.  Will obtain PFTs at next follow-up.  Encouraged her to use albuterol if she felt short of breath in the future to see if this helps.  Asthma: Diagnosed in the past.  Usually with bronchitis once a year.  Adequately treated with as needed albuterol at those times.  Primary symptom being cough and phlegm production.  Albuterol has helped with this.  Return in about 2 months (around 10/21/2020).   Lanier Clam, MD 08/20/2020

## 2020-09-23 DIAGNOSIS — M109 Gout, unspecified: Secondary | ICD-10-CM | POA: Diagnosis not present

## 2020-09-25 ENCOUNTER — Telehealth (INDEPENDENT_AMBULATORY_CARE_PROVIDER_SITE_OTHER): Payer: Self-pay

## 2020-09-25 NOTE — Telephone Encounter (Signed)
I called Ms Penny Huynh on 04/18/20 and also today 09/25/20 following up with her regarding scheduling her colonoscopy and she stated that she was not having any problems and someone her age really didn't need to be having anymore colonosopys so she has declined twice

## 2020-10-08 NOTE — Progress Notes (Signed)
[place]   [phone] [phone]   Clinic Follow up Note   Patient Care Team: Manon Hilding, MD as PCP - General (Cardiology) [name], MD (Inactive) as PCP - Cardiology (Cardiology) Truitt Merle, MD as Consulting Physician (Hematology) Excell Seltzer, MD (Inactive) as Consulting Physician (General Surgery) Alla Feeling, NP as Nurse Practitioner (Nurse Practitioner) [name], MD (Inactive) as Attending Physician (Cardiology)  Date of Service:  10/09/2020  CHIEF COMPLAINT: F/u on left breast cancer  ASSESSMENT & PLAN:  Penny Huynh is a 87 y.o. female with   1. 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 Treated with left mastectomy, followed by adjuvant chemo with Herceptin and adjuvant radiation.  -She started antiestrogen therapy with anastrozole in 10/2017. Tolerating well overall. Will continue for 5 years -She is clinically doing well, concern for breast cancer recurrence. Her physical exam and her 03/2020 mammogram were unremarkable. Lab reviewed.  -Continue surveillance. Next Mammogram will be in 03/2021 -Continue Anastrozole, she is tolerating well  -F/u in 6 months    3.  Bradycardia and third-degree AV block -She is asymptomatic, her EKG today showed heart rate 44, Grade III AVB -I consulted the on-call cardiologist, and will send her to [facility] emergency room for admission.  3. Peripheral neuropathy, G2, secondary to taxol, DM and T-DM1 -Follow-up with Dr. Mickeal Skinner as needed.  -She has tried gabapentin, Amytryptilline, Pregabalin, and Lidocaine with some improvement.  -She is currently on Lyrica and participating in neuropathy program with Chiropractor which  has helped. She now has manageable numbness/tingling in her feet and numbness in fingers.  -Balance has improved and ambulated with cane as needed. No recent falls.    4. HTN, CAD, DM -f/u with PCP   5. Osteoporosis -07/2018 DEXA shows osteoporosis with lowest T-score -2.6 at left forearm radius. Repeat in 07/2020.  -She is on oral Fosamax weekly through her PCP. Continue along with Calcium and Vit d.    6. Memory loss  -She has developed mild expressive aphasia and memory loss in early 2021 -She no longer has home aid and only needs help getting out of tub otherwise she is able to take care of herself at home.  -some improvement lately  -Continue to f/u with Dr. Mickeal Skinner as needed.     Plan Continue anastrozole  -Lab and follow-up in 6 months -I spoke with on-call cardiologist regarding her abnormal EKG findings. We will send patient to ED for admission due to his new onset third-degree AV BLOCK     SUMMARY OF ONCOLOGIC HISTORY: Oncology History Overview Note  Cancer Staging Carcinoma of upper-inner quadrant of left breast in female, estrogen receptor positive ([place]) Staging form: Breast, AJCC 8th Edition - Clinical stage from 01/18/2017: Stage IB (cT2, cN0, cM0, G3, ER: Positive, PR: Positive, HER2: Positive) - Signed by Truitt Merle, MD on 02/10/2017 - Pathologic stage from 02/23/2017: Stage IB (pT3, pN0, cM0, G3, ER: Positive, PR: Positive, HER2: Positive) - Signed by Alla Feeling, NP on 03/16/2017  Carcinoma of upper-outer quadrant of left breast in female, estrogen receptor positive ([place]) Staging form: Breast, AJCC 8th Edition - Clinical stage from 01/18/2017: Stage IB (cT2, cN0, cM0, G2, ER: Positive, PR: Positive, HER2: Negative) - Unsigned - Pathologic stage from  02/23/2017: Stage IB (pT2, pN0(sn), cM0, G3, ER: Positive, PR: Positive, HER2: Negative) - Signed by Alla Feeling, NP on 03/16/2017     Carcinoma of upper-outer quadrant of left breast in female, estrogen receptor  positive ([place])  01/13/2017 Mammogram   FINDINGS: In the left breast, a spiculated mass lies in the upper outer quadrant. In the medial posterior left breast, there is a larger lobulated mass. These masses correspond to the palpable abnormalities. No other discrete left breast masses.   In the right breast, there are no discrete masses. There are no areas of architectural distortion.   In both breasts there are scattered calcifications, rounded punctate, with a few other larger coarse dystrophic calcifications, without significant change from the previous screening mammogram, which is dated 12/04/2009.   Mammographic images were processed with CAD.   On physical exam, there is a firm palpable mass in the upper outer left breast and another firm mass in the medial left breast.     01/13/2017 Breast US   Targeted ultrasound is performed, showing an irregular hypoechoic shadowing mass in the left breast at the 2:30 o'clock position, 5 cm the nipple, measuring 3.3 x 2.8 x 2.6 cm. In the 10:30 o'clock position of the left breast, 5 cm the nipple, there is irregular hypoechoic mass with somewhat more lobulated margins, corresponding to the lobulated mass seen mammographically. This measures 4.8 x 2.9 x 4.8 cm. Both masses show internal vascularity on color Doppler analysis.   Sonographic evaluation of the left axilla shows several nodes with thickened cortices. Status cortex measured is 5 mm. None of these lymph nodes appear enlarged but overall size criteria.   IMPRESSION: 1. Two masses in the left breast, 1 at the 2:30 o'clock position in the other at the 10:30 o'clock position, both highly suspicious for breast carcinoma. 2. Several abnormal left axillary lymph nodes with thickened cortices. 3. No evidence of right breast malignancy.     01/18/2017 Pathology Results   Diagnosis 1. Breast, left, needle core biopsy, 2:30 o'clock - INVASIVE MAMMARY CARCINOMA - MAMMARY  CARCINOMA IN-SITU - SEE COMMENT 2. Lymph node, needle/core biopsy, left axilla - NO CARCINOMA IDENTIFIED - SEE COMMENT 3. Breast, left, needle core biopsy, 10:30 o'clock - INVASIVE DUCTAL CARCINOMA - SEE COMMENT  1. PROGNOSTIC INDICATORS Results: IMMUNOHISTOCHEMICAL AND MORPHOMETRIC ANALYSIS PERFORMED MANUALLY Estrogen Receptor: 100%, POSITIVE, STRONG STAINING INTENSITY Progesterone Receptor: 60%, POSITIVE, STRONG STAINING INTENSITY Proliferation Marker Ki67: 60%  1. FLUORESCENCE IN-SITU HYBRIDIZATION Results: HER2 - NEGATIVE RATIO OF HER2/CEP17 SIGNALS 1.28 AVERAGE HER2 COPY NUMBER PER CELL 2.75  3. PROGNOSTIC INDICATORS Results: IMMUNOHISTOCHEMICAL AND MORPHOMETRIC ANALYSIS PERFORMED MANUALLY Estrogen Receptor: 100%, POSITIVE, STRONG STAINING INTENSITY Progesterone Receptor: 50%, POSITIVE, STRONG STAINING INTENSITY Proliferation Marker Ki67: 70%  3. FLUORESCENCE IN-SITU HYBRIDIZATION Results: HER2 - **POSITIVE** RATIO OF HER2/CEP17 SIGNALS 2.80 AVERAGE HER2 COPY NUMBER PER CELL 6.15  Microscopic Comment 1. The biopsy material shows an infiltrative proliferation of cells with large vesicular nuclei with inconspicuous nucleoli, arranged linearly and in small clusters. Based on the biopsy, the carcinoma appears Nottingham grade 2 of 3 and measures 0.9 cm in greatest linear extent. E-cadherin and prognostic markers (ER/PR/ki-67/HER2-FISH)are pending and will be reported in an addendum. Dr. Lyndon Code reviewed the case and agrees with the above diagnosis.   02/10/2017 Initial Diagnosis   Carcinoma of upper-outer quadrant of left breast in female, estrogen receptor positive ([place])   02/19/2017 Imaging   Bone Scan whole Body 02/19/17 IMPRESSION: Negative for evidence of bony metastatic disease.  02/19/2017 Imaging   CT CAP W Contrast 02/19/17 IMPRESSION: 1. Small right middle lobe pulmonary nodules up to 0.8 cm, nonspecific. Non-contrast chest CT at 3-6 months is recommended.  If the nodules are stable at time of repeat CT, then future CT at 18-24 months (from today's scan) is considered optional for low-risk patients, but is recommended for high-risk patients. This recommendation follows the consensus statement: Guidelines for Management of Incidental Pulmonary Nodules Detected on CT Images: From the Fleischner Society 2017; Radiology 2017; 284:228-243. 2. Subcentimeter hepatic lesions likely cysts but incompletely characterized due to small size. Attention on follow-up imaging recommended. 3. 2.1 cm fusiform right common iliac artery aneurysm. Continued surveillance recommended. 4. Aortic Atherosclerosis (ICD10-I70.0) and Emphysema (ICD10-J43.9).   02/23/2017 Pathology Results   Diagnosis 1. Breast, simple mastectomy, Left Total - INVASIVE DUCTAL CARCINOMA, MULTIFOCAL, NOTTINGHAM GRADE 3/3 (5.3 CM, 3.5 CM) - INVASIVE CARCINOMA INVOLVES THE DERMIS - DUCTAL CARCINOMA IN SITU, INTERMEDIATE GRADE - HYALINIZED FIBROADENOMA - MARGINS UNINVOLVED BY CARCINOMA - NO CARCINOMA IDENTIFIED IN TWO LYMPH NODES (0/2) - SEE ONCOLOGY TABLE AND COMMENT BELOW 2. Lymph node, sentinel, biopsy, Left Axillary - NO CARCINOMA IDENTIFIED IN ONE LYMPH NODE (0/1) 3. Lymph node, sentinel, biopsy - NO CARCINOMA IDENTIFIED IN ONE LYMPH NODE (0/1) 4. Lymph node, sentinel, biopsy - NO CARCINOMA IDENTIFIED IN ONE LYMPH NODE (0/1) 5. Lymph node, sentinel, biopsy - NO CARCINOMA IDENTIFIED IN ONE LYMPH NODE (0/1) Microscopic Comment 1. BREAST, INVASIVE TUMOR Procedure: Simple mastectomy Laterality: Left Tumor Size: 5. 3 cm, 3.5 cm Histologic Type: Invasive carcinoma of no special type (ductal, not otherwise specified) Grade: Nottingham Grade 3 Tubular Differentiation: 3 Nuclear Pleomorphism: 3 Mitotic Count: 2 Ductal Carcinoma in Situ (DCIS): Present Extent of Tumor: Skin: Invasive carcinoma directly invades into the dermis or epidermis without skin ulceration Margins: Invasive  carcinoma, distance from closest margin: 1.5 cm (posterior) DCIS, distance from closest margin: > 1 cm Regional Lymph Nodes: Number of Lymph Nodes Examined: 6 Number of Sentinel Lymph Nodes Examined: 4 Lymph Nodes with Macrometastases: 0 Lymph Nodes with Micrometastases: 0 Lymph Nodes with Isolated Tumor Cells: 0 Treatment effect: No known presurgical therapy Breast Prognostic Profile: See Also ([id number]) Estrogen Receptor: Positive (100%, strong); Positive (100%, strong) Progesterone Receptor: Positive (50%, strong); Positive (50%, strong) Her2: Positive (Ratio 2.80); Negative (Ratio 1.28) Ki-67: 70%; 60% Best tumor block for sendout testing: 1E Pathologic Stage Classification (pTNM, AJCC 8th Edition): Primary Tumor: mpT3 Regional Lymph Nodes: pN0 COMMENT: The two invasive carcinomas in the breast have slightly different morphologies. The larger lesion has a papillary and micropapillary pattern admixed with typical ductal carcinoma while the smaller lesion is more typical of a ductal lesion with linear arrays (E-cadherin performed on biopsy). There are foci within the larger lesion that are concerning for lymphovascular space invasion.    04/07/2017 - 04/27/2018 Chemotherapy   weekly taxol and herceptin x12 weeks, 04/07/17-06/23/17. Then herceptin q3 weeks for total 1 year which was switched to [place] on 07/14/17. Stopped after 2 doses due to worsening neuropathy. Switched back to maintenance Herceptin started 8/12019. Completed on 04/27/18.    07/20/2017 - 09/03/2017 Radiation Therapy   She started adjuvant radiation on 07/20/17 with Dr. Lisbeth Renshaw and plans to complete on 09/03/17.    10/2017 -  Anti-estrogen oral therapy   Anastrozole 41m daily starting 10/2017   09/27/2018 Procedure   She had her PAC removed on 09/27/18.       CURRENT THERAPY:  Anastrozole 118mdaily starting 10/2017  INTERVAL HISTORY:  Penny Huynh is here for a follow up of left breast cancer. She was last  seen by me 6 months ago. She presents to the clinic alone. She is doing well overall, balance still off and she uses a crane. No fall recently. She still has numberless and tingling at fingers and toes which is stable  She    All other systems were reviewed with the patient and are negative.  MEDICAL HISTORY:  Past Medical History:  Diagnosis Date   Arthritis    "maybe a little in my left knee" (02/23/2017)   Asthmatic bronchitis    Cancer of left breast ([place]) 2019   Coronary atherosclerosis    Minor nonobstructive CAD at cardiac catheterization 2009   Dyslipidemia    Femoral bruit 7/08   With normal ABIs   Hypertension    Iliac artery aneurysm ([facility])    Lower extremity edema    Chronic   Pneumonia ~ 1950   in 8th grade   Type II diabetes mellitus ([place])     SURGICAL HISTORY: Past Surgical History:  Procedure Laterality Date   APPENDECTOMY     BREAST BIOPSY Left 02/2017   CARDIAC CATHETERIZATION     "long time ago" (02/23/2017)   MASTECTOMY COMPLETE / SIMPLE W/ SENTINEL NODE BIOPSY Left 02/23/2017   TOTAL MATECTOMY;  LEFT AXILLARY SENTINEL LYMPH NODE BIOPSY ERAS PATHWAY   MASTECTOMY WITH RADIOACTIVE SEED GUIDED EXCISION AND AXILLARY SENTINEL LYMPH NODE BIOPSY Left 02/23/2017   Procedure: LEFT TOTAL MATECTOMY;  LEFT AXILLARY SENTINEL LYMPH NODE BIOPSY ERAS PATHWAY;  Surgeon: Excell Seltzer, MD;  Location: [place];  Service: General;  Laterality: Left;   PILONIDAL CYST EXCISION     PORT-A-CATH REMOVAL Right 09/27/2018   Procedure: REMOVAL PORT-A-CATH;  Surgeon: Fanny Skates, MD;  Location: [place];  Service: General;  Laterality: Right;   PORTA CATH INSERTION  02/23/2017   PORTACATH PLACEMENT N/A 02/23/2017   Procedure: INSERTION PORT-A-CATH;  Surgeon: Excell Seltzer, MD;  Location: [place];  Service: General;  Laterality: N/A;   SHOULDER ARTHROSCOPY W/ ROTATOR CUFF REPAIR Right     I have reviewed the social history and family history with the patient  and they are unchanged from previous note.  ALLERGIES:  has No Known Allergies.  MEDICATIONS:  Current Outpatient Medications  Medication Sig Dispense Refill   albuterol (VENTOLIN HFA) 108 (90 Base) MCG/ACT inhaler Inhale into the lungs every 6 (six) hours as needed for wheezing or shortness of breath.     alendronate (FOSAMAX) 70 MG tablet Take 70 mg by mouth once a week.     amLODipine (NORVASC) 5 MG tablet Take 1 tablet (5 mg total) by mouth daily. 90 tablet 3   anastrozole (ARIMIDEX) 1 MG tablet Take 1 tablet (1 mg total) by mouth daily. 90 tablet 3   aspirin 81 MG EC tablet Take 81 mg by mouth at bedtime.     chlorthalidone (HYGROTON) 25 MG tablet Take 25 mg by mouth daily.     Coenzyme Q10 (COQ10) 100 MG CAPS Take 100 mg by mouth daily.     losartan (COZAAR) 100 MG tablet Take 100 mg by mouth daily.     metFORMIN (GLUCOPHAGE) 500 MG tablet Take 1,000 mg by mouth daily with breakfast.     pregabalin (LYRICA) 150 MG capsule TAKE ONE CAPSULE BY MOUTH TWICE A DAY 60 capsule 3   rosuvastatin (CRESTOR) 5 MG tablet Take 5 mg by mouth every other day.  vitamin B-12 (CYANOCOBALAMIN) 1000 MCG tablet Take 1,000 mcg by mouth daily.     Vitamin D, Cholecalciferol, 25 MCG (1000 UT) CAPS Take 1 capsule by mouth daily.      No current facility-administered medications for this visit.    PHYSICAL EXAMINATION: ECOG PERFORMANCE STATUS: 1 - Symptomatic but completely ambulatory  Vitals:   10/09/20 1055  BP: (!) 175/65  Pulse: (!) 44  Resp: 17  Temp: 98 F (36.7 C)  SpO2: 99%    Filed Weights   10/09/20 1055  Weight: 174 lb 1.6 oz (79 kg)     GENERAL:alert, no distress and comfortable SKIN: skin color, texture, turgor are normal, no rashes or significant lesions (+) Multiple skin moles across chest  EYES: normal, Conjunctiva are pink and non-injected, sclera clear  NECK: supple, thyroid normal size, non-tender, without nodularity LYMPH:  no palpable lymphadenopathy in the cervical,  axillary  LUNGS: clear to auscultation and percussion with normal breathing effort HEART: regular rate & rhythm and no murmurs and no lower extremity edema ABDOMEN:abdomen soft, non-tender and normal bowel sounds Musculoskeletal:no cyanosis of digits and no clubbing  NEURO: alert & oriented x 3 with fluent speech, no focal motor/sensory deficits BREAST: S/p left mastectomy: surgical incision healed well with scar tissue (+) Mild soft tissue fullness in lateral left breast/axilla. (+) Skin darkening of left breast from RT. Right Breast exam benign.   LABORATORY DATA:  I have reviewed the data as listed CBC Latest Ref Rng & Units 10/09/2020 04/08/2020 10/06/2019  WBC 4.0 - 10.5 K/uL 3.7(L) 3.2(L) 4.1  Hemoglobin 12.0 - 15.0 g/dL 12.4 12.0 12.4  Hematocrit 36.0 - 46.0 % 38.3 36.3 38.1  Platelets 150 - 400 K/uL 191 244 215     CMP Latest Ref Rng & Units 10/09/2020 04/08/2020 10/06/2019  Glucose 70 - 99 mg/dL 183(H) 110(H) 111(H)  BUN 8 - 23 mg/dL _0 Creatinine 0.44 - 1.00 mg/dL 0.88 0.83 0.75  Sodium 135 - 145 mmol/L 139 131(L) 130(L)  Potassium 3.5 - 5.1 mmol/L 3.9 3.5 3.7  Chloride 98 - 111 mmol/L 105 97(L) 95(L)  CO2 22 - 32 mmol/L _1 Calcium 8.9 - 10.3 mg/dL 9.4 9.6 9.7  Total Protein 6.5 - 8.1 g/dL 6.6 7.5 7.0  Total Bilirubin 0.3 - 1.2 mg/dL 1.0 1.0 0.6  Alkaline Phos 38 - 126 U/L 44 41 46  AST 15 - 41 U/L 14(L) 23 16  ALT 0 - 44 U/L _2 RADIOGRAPHIC STUDIES: I have personally reviewed the radiological images as listed and agreed with the findings in the report. No results found.     No problem-specific Assessment & Plan notes found for this encounter.   Orders Placed This Encounter  Procedures   MM Digital Screening Unilat R    Standing Status:   Future    Standing Expiration Date:   10/09/2021    Order Specific Question:   Reason for Exam (SYMPTOM  OR DIAGNOSIS REQUIRED)    Answer:   screening    Order Specific Question:   Preferred imaging location?     Answer:   [facility]   DG Bone Density    Standing Status:   Future    Standing Expiration Date:   10/09/2021    Order Specific Question:   Reason for Exam (SYMPTOM  OR DIAGNOSIS REQUIRED)    Answer:   screening    Order Specific Question:   Preferred imaging  location?    Answer:   [facility]   EKG 12-Lead    Ordered by an unspecified provider    EKG 12-Lead    Ordered by an unspecified provider     All questions were answered. The patient knows to call the clinic with any problems, questions or concerns. No barriers to learning was detected. The total time spent in the appointment was 40 minutes.     Truitt Merle, MD 10/09/2020   I, Joslyn Devon, am acting as scribe for Truitt Merle, MD.   I have reviewed the above documentation for accuracy and completeness, and I agree with the above.

## 2020-10-09 ENCOUNTER — Inpatient Hospital Stay: Payer: Medicare Other | Attending: Hematology

## 2020-10-09 ENCOUNTER — Telehealth: Payer: Self-pay

## 2020-10-09 ENCOUNTER — Encounter: Payer: Self-pay | Admitting: Hematology

## 2020-10-09 ENCOUNTER — Inpatient Hospital Stay (HOSPITAL_BASED_OUTPATIENT_CLINIC_OR_DEPARTMENT_OTHER): Payer: Medicare Other | Admitting: Hematology

## 2020-10-09 ENCOUNTER — Inpatient Hospital Stay (HOSPITAL_COMMUNITY)
Admission: EM | Admit: 2020-10-09 | Discharge: 2020-10-11 | DRG: 244 | Disposition: A | Discharge status: Home / Self Care | Payer: Medicare Other | Source: Ambulatory Visit | Attending: Internal Medicine | Admitting: Internal Medicine

## 2020-10-09 ENCOUNTER — Other Ambulatory Visit: Payer: Self-pay

## 2020-10-09 ENCOUNTER — Encounter (HOSPITAL_COMMUNITY): Payer: Self-pay | Admitting: Emergency Medicine

## 2020-10-09 VITALS — BP 175/65 | HR 44 | Temp 98.0°F | Resp 17 | Wt 174.1 lb

## 2020-10-09 DIAGNOSIS — M109 Gout, unspecified: Secondary | ICD-10-CM | POA: Diagnosis present

## 2020-10-09 DIAGNOSIS — Z950 Presence of cardiac pacemaker: Secondary | ICD-10-CM | POA: Diagnosis not present

## 2020-10-09 DIAGNOSIS — M1712 Unilateral primary osteoarthritis, left knee: Secondary | ICD-10-CM | POA: Diagnosis present

## 2020-10-09 DIAGNOSIS — Z833 Family history of diabetes mellitus: Secondary | ICD-10-CM | POA: Diagnosis not present

## 2020-10-09 DIAGNOSIS — E7800 Pure hypercholesterolemia, unspecified: Secondary | ICD-10-CM | POA: Diagnosis present

## 2020-10-09 DIAGNOSIS — I459 Conduction disorder, unspecified: Secondary | ICD-10-CM | POA: Diagnosis not present

## 2020-10-09 DIAGNOSIS — I442 Atrioventricular block, complete: Principal | ICD-10-CM | POA: Diagnosis present

## 2020-10-09 DIAGNOSIS — C50212 Malignant neoplasm of upper-inner quadrant of left female breast: Secondary | ICD-10-CM

## 2020-10-09 DIAGNOSIS — I10 Essential (primary) hypertension: Secondary | ICD-10-CM | POA: Diagnosis present

## 2020-10-09 DIAGNOSIS — C50412 Malignant neoplasm of upper-outer quadrant of left female breast: Secondary | ICD-10-CM | POA: Diagnosis not present

## 2020-10-09 DIAGNOSIS — I700 Atherosclerosis of aorta: Secondary | ICD-10-CM | POA: Diagnosis present

## 2020-10-09 DIAGNOSIS — E2839 Other primary ovarian failure: Secondary | ICD-10-CM | POA: Diagnosis not present

## 2020-10-09 DIAGNOSIS — Z20822 Contact with and (suspected) exposure to covid-19: Secondary | ICD-10-CM | POA: Diagnosis not present

## 2020-10-09 DIAGNOSIS — Z170 Estrogen receptor positive status [ER+]: Secondary | ICD-10-CM

## 2020-10-09 DIAGNOSIS — Z9012 Acquired absence of left breast and nipple: Secondary | ICD-10-CM

## 2020-10-09 DIAGNOSIS — Z7984 Long term (current) use of oral hypoglycemic drugs: Secondary | ICD-10-CM

## 2020-10-09 DIAGNOSIS — I2510 Atherosclerotic heart disease of native coronary artery without angina pectoris: Secondary | ICD-10-CM | POA: Diagnosis present

## 2020-10-09 DIAGNOSIS — I4510 Unspecified right bundle-branch block: Secondary | ICD-10-CM | POA: Diagnosis present

## 2020-10-09 DIAGNOSIS — Z823 Family history of stroke: Secondary | ICD-10-CM | POA: Diagnosis not present

## 2020-10-09 DIAGNOSIS — E1142 Type 2 diabetes mellitus with diabetic polyneuropathy: Secondary | ICD-10-CM | POA: Diagnosis present

## 2020-10-09 DIAGNOSIS — Z1231 Encounter for screening mammogram for malignant neoplasm of breast: Secondary | ICD-10-CM

## 2020-10-09 DIAGNOSIS — R001 Bradycardia, unspecified: Secondary | ICD-10-CM | POA: Diagnosis not present

## 2020-10-09 DIAGNOSIS — Z853 Personal history of malignant neoplasm of breast: Secondary | ICD-10-CM | POA: Diagnosis not present

## 2020-10-09 DIAGNOSIS — J4520 Mild intermittent asthma, uncomplicated: Secondary | ICD-10-CM | POA: Diagnosis present

## 2020-10-09 LAB — CBC WITH DIFFERENTIAL/PLATELET
Abs Immature Granulocytes: 0 10*3/uL (ref 0.00–0.07)
Basophils Absolute: 0.1 10*3/uL (ref 0.0–0.1)
Basophils Relative: 1 %
Eosinophils Absolute: 0.2 10*3/uL (ref 0.0–0.5)
Eosinophils Relative: 4 %
HCT: 39.7 % (ref 36.0–46.0)
Hemoglobin: 12.3 g/dL (ref 12.0–15.0)
Immature Granulocytes: 0 %
Lymphocytes Relative: 40 %
Lymphs Abs: 1.8 10*3/uL (ref 0.7–4.0)
MCH: 29.9 pg (ref 26.0–34.0)
MCHC: 31 g/dL (ref 30.0–36.0)
MCV: 96.6 fL (ref 80.0–100.0)
Monocytes Absolute: 0.4 10*3/uL (ref 0.1–1.0)
Monocytes Relative: 10 %
Neutro Abs: 2 10*3/uL (ref 1.7–7.7)
Neutrophils Relative %: 45 %
Platelets: 196 10*3/uL (ref 150–400)
RBC: 4.11 MIL/uL (ref 3.87–5.11)
RDW: 13.6 % (ref 11.5–15.5)
WBC: 4.5 10*3/uL (ref 4.0–10.5)
nRBC: 0 % (ref 0.0–0.2)

## 2020-10-09 LAB — CBC WITH DIFFERENTIAL (CANCER CENTER ONLY)
Abs Immature Granulocytes: 0.02 10*3/uL (ref 0.00–0.07)
Basophils Absolute: 0 10*3/uL (ref 0.0–0.1)
Basophils Relative: 1 %
Eosinophils Absolute: 0.1 10*3/uL (ref 0.0–0.5)
Eosinophils Relative: 3 %
HCT: 38.3 % (ref 36.0–46.0)
Hemoglobin: 12.4 g/dL (ref 12.0–15.0)
Immature Granulocytes: 1 %
Lymphocytes Relative: 33 %
Lymphs Abs: 1.2 10*3/uL (ref 0.7–4.0)
MCH: 29.7 pg (ref 26.0–34.0)
MCHC: 32.4 g/dL (ref 30.0–36.0)
MCV: 91.8 fL (ref 80.0–100.0)
Monocytes Absolute: 0.4 10*3/uL (ref 0.1–1.0)
Monocytes Relative: 10 %
Neutro Abs: 2 10*3/uL (ref 1.7–7.7)
Neutrophils Relative %: 52 %
Platelet Count: 191 10*3/uL (ref 150–400)
RBC: 4.17 MIL/uL (ref 3.87–5.11)
RDW: 13.5 % (ref 11.5–15.5)
WBC Count: 3.7 10*3/uL — ABNORMAL LOW (ref 4.0–10.5)
nRBC: 0 % (ref 0.0–0.2)

## 2020-10-09 LAB — BASIC METABOLIC PANEL
Anion gap: 9 (ref 5–15)
BUN: 17 mg/dL (ref 8–23)
CO2: 25 mmol/L (ref 22–32)
Calcium: 9.4 mg/dL (ref 8.9–10.3)
Chloride: 103 mmol/L (ref 98–111)
Creatinine, Ser: 0.92 mg/dL (ref 0.44–1.00)
GFR, Estimated: >60 mL/min (ref >60)
Glucose, Bld: 173 mg/dL — ABNORMAL HIGH (ref 70–99)
Potassium: 4.2 mmol/L (ref 3.5–5.1)
Sodium: 137 mmol/L (ref 135–145)

## 2020-10-09 LAB — CREATININE, SERUM
Creatinine, Ser: 0.74 mg/dL (ref 0.44–1.00)
GFR, Estimated: >60 mL/min (ref >60)

## 2020-10-09 LAB — HEMOGLOBIN A1C
Hgb A1c MFr Bld: 7.4 % — ABNORMAL HIGH (ref 4.8–5.6)
Mean Plasma Glucose: 165.68 mg/dL

## 2020-10-09 LAB — CMP (CANCER CENTER ONLY)
ALT: 11 U/L (ref 0–44)
AST: 14 U/L — ABNORMAL LOW (ref 15–41)
Albumin: 3.7 g/dL (ref 3.5–5.0)
Alkaline Phosphatase: 44 U/L (ref 38–126)
Anion gap: 10 (ref 5–15)
BUN: 15 mg/dL (ref 8–23)
CO2: 24 mmol/L (ref 22–32)
Calcium: 9.4 mg/dL (ref 8.9–10.3)
Chloride: 105 mmol/L (ref 98–111)
Creatinine: 0.88 mg/dL (ref 0.44–1.00)
GFR, Estimated: >60 mL/min (ref >60)
Glucose, Bld: 183 mg/dL — ABNORMAL HIGH (ref 70–99)
Potassium: 3.9 mmol/L (ref 3.5–5.1)
Sodium: 139 mmol/L (ref 135–145)
Total Bilirubin: 1 mg/dL (ref 0.3–1.2)
Total Protein: 6.6 g/dL (ref 6.5–8.1)

## 2020-10-09 LAB — CBC
HCT: 38.6 % (ref 36.0–46.0)
Hemoglobin: 12.4 g/dL (ref 12.0–15.0)
MCH: 30 pg (ref 26.0–34.0)
MCHC: 32.1 g/dL (ref 30.0–36.0)
MCV: 93.5 fL (ref 80.0–100.0)
Platelets: 186 10*3/uL (ref 150–400)
RBC: 4.13 MIL/uL (ref 3.87–5.11)
RDW: 13.5 % (ref 11.5–15.5)
WBC: 4.4 10*3/uL (ref 4.0–10.5)
nRBC: 0 % (ref 0.0–0.2)

## 2020-10-09 LAB — TROPONIN I (HIGH SENSITIVITY): Troponin I (High Sensitivity): 6 ng/L (ref <18)

## 2020-10-09 LAB — MAGNESIUM: Magnesium: 1.8 mg/dL (ref 1.7–2.4)

## 2020-10-09 MED ORDER — LATANOPROST 0.005 % OP SOLN
1.0000 [drp] | Freq: Every day | OPHTHALMIC | Status: DC
  Administered 2020-10-09 – 2020-10-10 (×2): 1 [drp] via OPHTHALMIC
  Filled 2020-10-09: qty 2.5

## 2020-10-09 MED ORDER — ASPIRIN EC 81 MG PO TBEC
81.0000 mg | DELAYED_RELEASE_TABLET | Freq: Every day | ORAL | Status: DC
  Administered 2020-10-09 – 2020-10-10 (×2): 81 mg via ORAL
  Filled 2020-10-09 (×2): qty 1

## 2020-10-09 MED ORDER — COLCHICINE 0.6 MG PO TABS
0.6000 mg | ORAL_TABLET | Freq: Four times a day (QID) | ORAL | Status: DC | PRN
  Filled 2020-10-09: qty 1

## 2020-10-09 MED ORDER — CHLORTHALIDONE 25 MG PO TABS
25.0000 mg | ORAL_TABLET | Freq: Every day | ORAL | Status: DC
  Administered 2020-10-10 – 2020-10-11 (×2): 25 mg via ORAL
  Filled 2020-10-09 (×2): qty 1

## 2020-10-09 MED ORDER — METFORMIN HCL 500 MG PO TABS: 500.0000 mg | ORAL_TABLET | Freq: Two times a day (BID) | ORAL | Status: DC

## 2020-10-09 MED ORDER — PREGABALIN 75 MG PO CAPS
150.0000 mg | ORAL_CAPSULE | Freq: Two times a day (BID) | ORAL | Status: DC
  Administered 2020-10-09 – 2020-10-11 (×4): 150 mg via ORAL
  Filled 2020-10-09 (×4): qty 2

## 2020-10-09 MED ORDER — LOSARTAN POTASSIUM 50 MG PO TABS
100.0000 mg | ORAL_TABLET | Freq: Every day | ORAL | Status: DC
  Administered 2020-10-10 – 2020-10-11 (×2): 100 mg via ORAL
  Filled 2020-10-09 (×2): qty 2

## 2020-10-09 MED ORDER — ALBUTEROL SULFATE HFA 108 (90 BASE) MCG/ACT IN AERS: 1.0000 | INHALATION_SPRAY | Freq: Four times a day (QID) | RESPIRATORY_TRACT | Status: DC | PRN

## 2020-10-09 MED ORDER — ANASTROZOLE 1 MG PO TABS
1.0000 mg | ORAL_TABLET | Freq: Every day | ORAL | Status: DC
  Administered 2020-10-10 – 2020-10-11 (×2): 1 mg via ORAL
  Filled 2020-10-09 (×2): qty 1

## 2020-10-09 MED ORDER — INSULIN ASPART 100 UNIT/ML IJ SOLN: 0.0000 [IU] | Freq: Three times a day (TID) | INTRAMUSCULAR | Status: DC

## 2020-10-09 MED ORDER — HEPARIN SODIUM (PORCINE) 5000 UNIT/ML IJ SOLN
5000.0000 [IU] | Freq: Three times a day (TID) | INTRAMUSCULAR | Status: DC
  Administered 2020-10-09 – 2020-10-10 (×2): 5000 [IU] via SUBCUTANEOUS
  Filled 2020-10-09 (×2): qty 1

## 2020-10-09 MED ORDER — HYDRALAZINE HCL 20 MG/ML IJ SOLN
10.0000 mg | Freq: Four times a day (QID) | INTRAMUSCULAR | Status: DC | PRN
Start: 2020-10-09 — End: 2020-10-11
  Administered 2020-10-10: 10 mg via INTRAVENOUS
  Filled 2020-10-09: qty 1

## 2020-10-09 MED ORDER — AMLODIPINE BESYLATE 5 MG PO TABS
5.0000 mg | ORAL_TABLET | Freq: Every day | ORAL | Status: DC
  Administered 2020-10-10 – 2020-10-11 (×2): 5 mg via ORAL
  Filled 2020-10-09 (×2): qty 1

## 2020-10-09 MED ORDER — ONDANSETRON HCL 4 MG/2ML IJ SOLN
4.0000 mg | Freq: Four times a day (QID) | INTRAMUSCULAR | Status: DC | PRN
  Administered 2020-10-10: 4 mg via INTRAVENOUS
  Filled 2020-10-09 (×2): qty 2

## 2020-10-09 MED ORDER — ACETAMINOPHEN 325 MG PO TABS: 650.0000 mg | ORAL_TABLET | ORAL | Status: DC | PRN

## 2020-10-09 MED ORDER — ALBUTEROL SULFATE (2.5 MG/3ML) 0.083% IN NEBU: 2.5000 mg | INHALATION_SOLUTION | Freq: Four times a day (QID) | RESPIRATORY_TRACT | Status: DC | PRN

## 2020-10-09 MED ORDER — ROSUVASTATIN CALCIUM 5 MG PO TABS
5.0000 mg | ORAL_TABLET | ORAL | Status: DC
  Administered 2020-10-10: 5 mg via ORAL
  Filled 2020-10-09: qty 1

## 2020-10-09 NOTE — ED Provider Notes (Signed)
[facility] EMERGENCY DEPARTMENT Provider Note   CSN: [id number] Arrival date & time: 10/09/20  1514     History Chief Complaint  Patient presents with   Bradycardia    Penny Huynh is a 87 y.o. female with a history of type 2 diabetes, high cholesterol, breast cancer status postmastectomy, now in remission, presented emergency department with low heart rate.  The patient reports that she was having routine outpatient visit today and was noted to her heart rate was low.  There was concern for new onset of heart block on her EKG this afternoon.  She was referred into the emergency department for further evaluation.  She states she has not felt lightheaded, has had no chest pain or shortness of breath or syncope episodes.  She reports that she did sleep with her apple watch last week and was told that her heart rate was below 40 for a long period of time, which she feels is unusual.  She does not take any beta-blockers or heart slowing medications.  She denies any history of MI or cardiac stents.  She is present with her husband at bedside.  HPI     Past Medical History:  Diagnosis Date   Arthritis    "maybe a little in my left knee" (02/23/2017)   Asthmatic bronchitis    Cancer of left breast ([facility]) 2019   Coronary atherosclerosis    Minor nonobstructive CAD at cardiac catheterization 2009   Dyslipidemia    Femoral bruit 7/08   With normal ABIs   Hypertension    Iliac artery aneurysm (HCC)    Lower extremity edema    Chronic   Pneumonia ~ 1950   in 8th grade   Type II diabetes mellitus [facility])     Patient Active Problem List   Diagnosis Date Noted   Complete heart block ([place]) 10/09/2020   Occult blood positive stool 04/17/2020   PAD (peripheral artery disease) ([place]) 03/17/2019   Chemotherapy-induced peripheral neuropathy ([place]) 09/03/2017   Port-A-Cath in place 04/14/2017   Stage II breast cancer, left ([place]) 02/23/2017   Carcinoma of upper-outer  quadrant of left breast in female, estrogen receptor positive ([place]) 02/10/2017   Carcinoma of upper-inner quadrant of left breast in female, estrogen receptor positive ([place]) 02/10/2017   Chest pain 07/18/2015   Hypokalemia 07/18/2015   Vertigo 07/18/2015   LBBB (left bundle branch block) 05/28/2009   AODM 12/21/2007   Essential hypertension, benign 123456   DIASTOLIC DYSFUNCTION 123456   FEMORAL BRUIT, RIGHT 12/21/2007   DYSPNEA 12/21/2007    Past Surgical History:  Procedure Laterality Date   APPENDECTOMY     BREAST BIOPSY Left 02/2017   CARDIAC CATHETERIZATION     "long time ago" (02/23/2017)   MASTECTOMY COMPLETE / SIMPLE W/ SENTINEL NODE BIOPSY Left 02/23/2017   TOTAL MATECTOMY;  LEFT AXILLARY SENTINEL LYMPH NODE BIOPSY ERAS PATHWAY   MASTECTOMY WITH RADIOACTIVE SEED GUIDED EXCISION AND AXILLARY SENTINEL LYMPH NODE BIOPSY Left 02/23/2017   Procedure: LEFT TOTAL MATECTOMY;  LEFT AXILLARY SENTINEL LYMPH NODE BIOPSY ERAS PATHWAY;  Surgeon: Excell Seltzer, MD;  Location: [facility];  Service: General;  Laterality: Left;   PILONIDAL CYST EXCISION     PORT-A-CATH REMOVAL Right 09/27/2018   Procedure: REMOVAL PORT-A-CATH;  Surgeon: Fanny Skates, MD;  Location: [facility];  Service: General;  Laterality: Right;   PORTA CATH INSERTION  02/23/2017   PORTACATH PLACEMENT N/A 02/23/2017   Procedure: INSERTION PORT-A-CATH;  Surgeon: Excell Seltzer, MD;  [facility];  Service: General;  Laterality: N/A;   SHOULDER ARTHROSCOPY W/ ROTATOR CUFF REPAIR Right      OB History   No obstetric history on file.     Family History  Problem Relation Age of Onset   Diabetes Mother    Stroke Father    Cancer Father        unknown type     Social History   Tobacco Use   Smoking status: Former    Packs/day: 2.00    Years: 46.00    Pack years: 92.00    Types: Cigarettes    Start date: 11/09/1953    Quit date: 02/03/1991    Years since quitting: 29.7   Smokeless  tobacco: Never  Vaping Use   Vaping Use: Never used  Substance Use Topics   Alcohol use: Yes    Alcohol/week: 2.0 standard drinks    Types: 2 Glasses of wine per week    Comment: social   Drug use: No    Home Medications Prior to Admission medications   Medication Sig Start Date End Date Taking? Authorizing Provider  albuterol (VENTOLIN HFA) 108 (90 Base) MCG/ACT inhaler Inhale 1 puff into the lungs every 6 (six) hours as needed for wheezing or shortness of breath.    [provider]  alendronate (FOSAMAX) 70 MG tablet Take 70 mg by mouth once a week. 03/16/19   [provider]  amLODipine (NORVASC) 5 MG tablet Take 1 tablet (5 mg total) by mouth daily. 04/04/19 07/03/19  [name], MD  anastrozole (ARIMIDEX) 1 MG tablet Take 1 tablet (1 mg total) by mouth daily. 04/05/19   Truitt Merle, MD  aspirin 81 MG EC tablet Take 81 mg by mouth at bedtime.    [provider]  chlorthalidone (HYGROTON) 25 MG tablet Take 25 mg by mouth daily.    [provider]  Coenzyme Q10 (COQ10) 100 MG CAPS Take 100 mg by mouth daily.    [provider]  colchicine 0.6 MG tablet Take 0.6 mg by mouth 4 (four) times daily as needed (gout flare). 09/23/20   [provider]  latanoprost (XALATAN) 0.005 % ophthalmic solution Place 1 drop into both eyes at bedtime. 08/13/20   [provider]  losartan (COZAAR) 100 MG tablet Take 100 mg by mouth daily. 06/11/19   [provider]  metFORMIN (GLUCOPHAGE) 500 MG tablet Take 500 mg by mouth 2 (two) times daily with a meal.    [provider]  pregabalin (LYRICA) 150 MG capsule TAKE ONE CAPSULE BY MOUTH TWICE A DAY Patient taking differently: Take 150 mg by mouth 2 (two) times daily. 08/06/20   Ventura Sellers, MD  rosuvastatin (CRESTOR) 5 MG tablet Take 5 mg by mouth every other day. 03/16/19   [provider]  vitamin B-12 (CYANOCOBALAMIN) 1000 MCG tablet Take 1,000 mcg by mouth daily.     [provider]  Vitamin D, Cholecalciferol, 25 MCG (1000 UT) CAPS Take 25 mcg by mouth daily.    [provider]    Allergies    Patient has no known allergies.  Review of Systems   Review of Systems  Constitutional:  Negative for chills and fever.  Eyes:  Negative for pain and visual disturbance.  Respiratory:  Negative for cough and shortness of breath.   Cardiovascular:  Negative for chest pain and palpitations.  Gastrointestinal:  Negative for abdominal pain and vomiting.  Genitourinary:  Negative for dysuria and  hematuria.  Musculoskeletal:  Negative for arthralgias and back pain.  Skin:  Negative for color change and rash.  Neurological:  Negative for syncope and light-headedness.  All other systems reviewed and are negative.  Physical Exam Updated Vital Signs BP (!) 169/103   Pulse (!) 42   Temp 98.6 F (37 C)   Resp 15   SpO2 96%   Physical Exam Constitutional:      General: She is not in acute distress. HENT:     Head: Normocephalic and atraumatic.  Eyes:     Conjunctiva/sclera: Conjunctivae normal.     Pupils: Pupils are equal, round, and reactive to light.  Cardiovascular:     Rate and Rhythm: Regular rhythm. Bradycardia present.     Pulses: Normal pulses.  Pulmonary:     Effort: Pulmonary effort is normal. No respiratory distress.     Breath sounds: Normal breath sounds.  Abdominal:     General: There is no distension.     Palpations: There is no mass.     Tenderness: There is no abdominal tenderness.  Skin:    General: Skin is warm and dry.  Neurological:     General: No focal deficit present.     Mental Status: She is alert and oriented to person, place, and time. Mental status is at baseline.  Psychiatric:        Mood and Affect: Mood normal.        Behavior: Behavior normal.    ED Results / Procedures / Treatments   Labs (all labs ordered are listed, but only abnormal results are displayed) Labs Reviewed  BASIC METABOLIC  PANEL - Abnormal; Notable for the following components:      Result Value   Glucose, Bld 173 (*)    All other components within normal limits  CBC WITH DIFFERENTIAL/PLATELET  MAGNESIUM  CBC  CREATININE, SERUM  BASIC METABOLIC PANEL  CBC  TSH  LIPID PANEL  HEMOGLOBIN A1C  TROPONIN I (HIGH SENSITIVITY)    EKG None  Radiology No results found.  Procedures .Critical Care  Date/Time: 10/09/2020 5:21 PM Performed by: Wyvonnia Dusky, MD Authorized by: Wyvonnia Dusky, MD   Critical care provider statement:    Critical care time (minutes):  35   Critical care was necessary to treat or prevent imminent or life-threatening deterioration of the following conditions:  Cardiac failure   Critical care was time spent personally by me on the following activities:  Discussions with consultants, evaluation of patient's response to treatment, examination of patient, ordering and performing treatments and interventions, ordering and review of laboratory studies, ordering and review of radiographic studies, pulse oximetry, re-evaluation of patient's condition, obtaining history from patient or surrogate and review of old charts Comments:     Complete heart block, repeat ECG interpretation, telemetry monitoring, specialist consultation   Medications Ordered in ED Medications  aspirin EC tablet 81 mg (has no administration in time range)  colchicine tablet 0.6 mg (has no administration in time range)  anastrozole (ARIMIDEX) tablet 1 mg (has no administration in time range)  amLODipine (NORVASC) tablet 5 mg (has no administration in time range)  chlorthalidone (HYGROTON) tablet 25 mg (has no administration in time range)  losartan (COZAAR) tablet 100 mg (has no administration in time range)  rosuvastatin (CRESTOR) tablet 5 mg (has no administration in time range)  pregabalin (LYRICA) capsule 150 mg (has no administration in time range)  latanoprost (XALATAN) 0.005 % ophthalmic solution 1  drop (has no administration  in time range)  acetaminophen (TYLENOL) tablet 650 mg (has no administration in time range)  ondansetron (ZOFRAN) injection 4 mg (has no administration in time range)  heparin injection 5,000 Units (has no administration in time range)  albuterol (PROVENTIL) (2.5 MG/3ML) 0.083% nebulizer solution 2.5 mg (has no administration in time range)  hydrALAZINE (APRESOLINE) injection 10 mg (has no administration in time range)  insulin aspart (novoLOG) injection 0-15 Units (has no administration in time range)    ED Course  I have reviewed the triage vital signs and the nursing notes.  Pertinent labs & imaging results that were available during my care of the patient were reviewed by me and considered in my medical decision making (see chart for details).  Patient here with asymptomatic CHB on ECG.  BP okay on arrival. Repeat ecg's reviewed - consistent with CHB. Cardiology consulted and recommending hospitalization  Labs ordered and reviewed showing no significant findings on BMP, troponin, magnesium, CBC. Telemetry monitored - ventricular rate stable in 40's  Patient placed on telemetry and pacer pads while in ED Husband present at bedside  Clinical Course as of 10/09/20 1922  Wed Oct 09, 2020  1711 Patient asymptomatic.  Paged cardiology [MT]  1715 I spoke to Dr Martinique from cardiology who will come evaluate the patient. [MT]  1919 Admitted to cardiology service [MT]    Clinical Course User Index [MT] Langston Masker Carola Rhine, MD    Final Clinical Impression(s) / ED Diagnoses Final diagnoses:  Bradycardia  Heart block    Rx / DC Orders ED Discharge Orders     None        Kaedence Connelly, Carola Rhine, MD 10/09/20 [place]

## 2020-10-09 NOTE — H&P (Signed)
Cardiology Admission History and Physical:   Patient ID: Penny Huynh MRN: [id number]; DOB: 1933-04-17   Admission date: 10/09/2020  PCP:  Manon Hilding, MD   [facility] Cardiologist:  Kate Sable, MD (Inactive)        Chief Complaint:  AV block  Patient Profile:   Penny Huynh is a 87 y.o. female with Mild non obstructive CAD s/p LHC in 2009, HTN with DM, L breast cancer s/p Taxol, Herceptin and 60 [address] Cables radiation and who is being seen 10/09/2020 for the evaluation of complete heart block.  History of Present Illness:   Ms. Penny Huynh  notes that she is feeling fine.  Has had no chest pain, chest pressure, chest tightness, chest stinging .  Patient exertion notable for able to ambulate  without incident, except needing a cane for stairs and feels no symptoms.  No shortness of breath, DOE .  No PND or orthopnea.  No weight gain, leg swelling , or abdominal swelling.  No syncope or near syncope . Notes  no palpitations or funny heart beats.   No leg claudication.  Patient recently came back forma high school reunion with her husband and was active without incidence.  Despite this, her able watch noted that she had a heart rate of 40 bpm for greater than 10 minutes.  Patient went to see her oncologist in follow up for her breast cancer.  EKG was notable for complete HB.  Patient takes no AV nodal agents.  BP at this visit was 175/65.  Cardiology called for evaluation and admission.  No fevers, chills, night sweats. No hiking.  Patient is right handed; uses no assist.   Past Medical History:  Diagnosis Date   Arthritis    "maybe a little in my left knee" (02/23/2017)   Asthmatic bronchitis    Cancer of left breast ([place]) 2019   Coronary atherosclerosis    Minor nonobstructive CAD at cardiac catheterization 2009   Dyslipidemia    Femoral bruit 7/08   With normal ABIs   Hypertension    Iliac artery aneurysm (HCC)    Lower extremity edema    Chronic    Pneumonia ~ 1950   in 8th grade   Type II diabetes mellitus ([place])     Past Surgical History:  Procedure Laterality Date   APPENDECTOMY     BREAST BIOPSY Left 02/2017   CARDIAC CATHETERIZATION     "long time ago" (02/23/2017)   MASTECTOMY COMPLETE / SIMPLE W/ SENTINEL NODE BIOPSY Left 02/23/2017   TOTAL MATECTOMY;  LEFT AXILLARY SENTINEL LYMPH NODE BIOPSY ERAS PATHWAY   MASTECTOMY WITH RADIOACTIVE SEED GUIDED EXCISION AND AXILLARY SENTINEL LYMPH NODE BIOPSY Left 02/23/2017   Procedure: LEFT TOTAL MATECTOMY;  LEFT AXILLARY SENTINEL LYMPH NODE BIOPSY ERAS PATHWAY;  Surgeon: Excell Seltzer, MD;  Location: [facility];  Service: General;  Laterality: Left;   PILONIDAL CYST EXCISION     PORT-A-CATH REMOVAL Right 09/27/2018   Procedure: REMOVAL PORT-A-CATH;  Surgeon: Fanny Skates, MD;  Location: [place];  Service: General;  Laterality: Right;   PORTA CATH INSERTION  02/23/2017   PORTACATH PLACEMENT N/A 02/23/2017   Procedure: INSERTION PORT-A-CATH;  Surgeon: Excell Seltzer, MD;  Location: [facility];  Service: General;  Laterality: N/A;   SHOULDER ARTHROSCOPY W/ ROTATOR CUFF REPAIR Right      Medications Prior to Admission: Prior to Admission medications   Medication Sig Start Date End Date Taking? Authorizing Provider  albuterol (VENTOLIN HFA) 108 (90  Base) MCG/ACT inhaler Inhale 1 puff into the lungs every 6 (six) hours as needed for wheezing or shortness of breath.    [provider]  alendronate (FOSAMAX) 70 MG tablet Take 70 mg by mouth once a week. 03/16/19   [provider]  amLODipine (NORVASC) 5 MG tablet Take 1 tablet (5 mg total) by mouth daily. 04/04/19 07/03/19  [name], MD  anastrozole (ARIMIDEX) 1 MG tablet Take 1 tablet (1 mg total) by mouth daily. 04/05/19   Truitt Merle, MD  aspirin 81 MG EC tablet Take 81 mg by mouth at bedtime.    [provider]  chlorthalidone (HYGROTON) 25 MG tablet Take 25 mg by mouth daily.    [provider]  Coenzyme Q10 (COQ10) 100 MG CAPS Take 100 mg by mouth daily.    [provider]  colchicine 0.6 MG tablet Take 0.6 mg by mouth 4 (four) times daily as needed (gout flare). 09/23/20   [provider]  latanoprost (XALATAN) 0.005 % ophthalmic solution Place 1 drop into both eyes at bedtime. 08/13/20   [provider]  losartan (COZAAR) 100 MG tablet Take 100 mg by mouth daily. 06/11/19   [provider]  metFORMIN (GLUCOPHAGE) 500 MG tablet Take 500 mg by mouth 2 (two) times daily with a meal.    [provider]  pregabalin (LYRICA) 150 MG capsule TAKE ONE CAPSULE BY MOUTH TWICE A DAY Patient taking differently: Take 150 mg by mouth 2 (two) times daily. 08/06/20   Ventura Sellers, MD  rosuvastatin (CRESTOR) 5 MG tablet Take 5 mg by mouth every other day. 03/16/19   [provider]  vitamin B-12 (CYANOCOBALAMIN) 1000 MCG tablet Take 1,000 mcg by mouth daily.    [provider]  Vitamin D, Cholecalciferol, 25 MCG (1000 UT) CAPS Take 25 mcg by mouth daily.    [provider]     Allergies:   No Known Allergies  Social History:   Social History   Socioeconomic History   Marital status: Married    Spouse name: Not on file   Number of children: 2   Years of education: Not on file   Highest education level: Not on file  Occupational History   Occupation: tax preparer for [employer]    Comment: only works during tax season but not currently    Occupation: in Dentist    Occupation: retired Mining engineer of education     Comment: 1997  Tobacco Use   Smoking status: Former    Packs/day: 2.00    Years: 46.00    Pack years: 92.00    Types: Cigarettes    Start date: 11/09/1953    Quit date: 02/03/1991    Years since quitting: 29.7   Smokeless tobacco: Never  Vaping Use   Vaping Use: Never used  Substance and Sexual Activity   Alcohol use: Yes    Alcohol/week: 2.0 standard drinks    Types: 2 Glasses of wine per week     Comment: social   Drug use: No   Sexual activity: Not on file  Other Topics Concern   Not on file  Social History Narrative   Lives in [place] with her husband.   Has 2 grown children.   10-25-17 Unable to ask abuse questions husband with her today.   Social Determinants of Health   Financial Resource Strain: Not on file  Food Insecurity: Not on file  Transportation Needs: Not on file  Physical Activity: Not on file  Stress: Not on file  Social Connections: Not on file  Intimate Partner Violence: Not on file    Social: Married, partner see [facility] for PPM  Family History:   The patient's family history includes Cancer in her father; Diabetes in her mother; Stroke in her father.    ROS:  Please see the history of present illness.  All other ROS reviewed and negative.     Physical Exam/Data:   Vitals:   10/09/20 1616  BP: (!) 164/54  Pulse: (!) 45  Resp: 17  Temp: 98.6 F (37 C)  SpO2: 100%   No intake or output data in the 24 hours ending 10/09/20 1802 Last 3 Weights 10/09/2020 08/20/2020 04/17/2020  Weight (lbs) 174 lb 1.6 oz 167 lb 6.4 oz 170 lb  Weight (kg) 78.971 kg 75.932 kg 77.111 kg     There is no height or weight on file to calculate BMI.  General:  Well nourished, well developed, in no acute distress HEENT: normal Lymph: no adenopathy Neck: no JVD Endocrine:  No thryomegaly Vascular: No carotid bruits; FA pulses 2+ bilaterally without bruits  Cardiac:  normal S1, S2; Regular bradycardia with rare holosystolic murmur Lungs:  clear to auscultation bilaterally, no wheezing, rhonchi or rales  Abd: soft, nontender, no hepatomegaly  Ext: no edema Musculoskeletal:  No deformities, BUE and BLE strength normal and equal Skin: warm and dry  Neuro:  CNs 2-12 intact, no focal abnormalities noted Psych:  Normal affect    EKG:  The ECG that was done  was personally reviewed and demonstrates  2:1 AV block; occasional RBBB  Telemetry has shown High Grade AV  block  Relevant CV Studies:  Transthoracic Echocardiogram: Date:04/19/2019 Results:  1. Left ventricular ejection fraction, by estimation, is 60 to 65%. The  left ventricle has normal function. The left ventricle has no regional  wall motion abnormalities. Left ventricular diastolic parameters are  consistent with Grade I diastolic  dysfunction (impaired relaxation).   2. Right ventricular systolic function is normal. The right ventricular  size is normal. There is normal pulmonary artery systolic pressure.   3. The mitral valve is normal in structure. Mild mitral valve  regurgitation. No evidence of mitral stenosis.   4. The aortic valve has an indeterminant number of cusps. Aortic valve  regurgitation is not visualized. No aortic stenosis is present.   5. The inferior vena cava is normal in size with greater than 50%  respiratory variability, suggesting right atrial pressure of 3 mmHg.   CT Chest:  Date:02/18/2017 Results: Aortic Atherosclerosis and LAD RCA and AV valve calcification 1. Small right middle lobe pulmonary nodules up to 0.8 cm, nonspecific. Non-contrast chest CT at 3-6 months is recommended. If the nodules are stable at time of repeat CT, then future CT at 18-24 months (from today's scan) is considered optional for low-risk patients, but is recommended for high-risk patients. This recommendation follows the consensus statement: Guidelines for Management of Incidental Pulmonary Nodules Detected on CT Images: From the Fleischner Society 2017; Radiology 2017; 284:228-243. 2. Subcentimeter hepatic lesions likely cysts but incompletely characterized due to small size. Attention on follow-up imaging recommended. 3. 2.1 cm fusiform right common iliac artery aneurysm. Continued surveillance recommended.   Laboratory Data:  High Sensitivity Troponin:  No results for input(s): TROPONINIHS in the last 720 hours.    Chemistry Recent Labs  Lab 10/09/20 1034  NA 139  K  3.9  CL 105  CO2 24  GLUCOSE 183*  BUN 15  CREATININE 0.88  CALCIUM 9.4  GFRNONAA >60  ANIONGAP 10    Recent Labs  Lab 10/09/20 1034  PROT 6.6  ALBUMIN 3.7  AST 14*  ALT 11  ALKPHOS 44  BILITOT 1.0   Hematology Recent Labs  Lab 10/09/20 1034  WBC 3.7*  RBC 4.17  HGB 12.4  HCT 38.3  MCV 91.8  MCH 29.7  MCHC 32.4  RDW 13.5  PLT 191   BNPNo results for input(s): BNP, PROBNP in the last 168 hours.  DDimer No results for input(s): DDIMER in the last 168 hours.   Radiology/Studies:  No results found.   Assessment and Plan:   High Grade AV block with compensatory HTN HTN with DM Hx of Breast Cancer with cardiotoxic therapy - PPM consideration is reasonable - will get TSH, CMP in AM - will get echo in AM - we have discussed risk, benefits, and indication of PPM - Home antihypertensives are restarted - PRN meds for BP over 180 - will hold home metformin - if BG elevated, will reach out to glucose mgmt team  Aortic atherosclerosis Mild non obstructive CAD Hx of breast cancer with high dose chest radiation - will continue ASA - continue low dose statin - LDL in AM; goal < 70  Peripheral neuropathy - continue home meds  Mild intermittent asthma - on albuterol  Gout - on colchicine  Full code DVT PPX: heparin Diet: Cardiac Carb consistent; with NPO at midnight     Severity of Illness: The appropriate patient status for this patient is OBSERVATION. Observation status is judged to be reasonable and necessary in order to provide the required intensity of service to ensure the patient's safety. The patient's presenting symptoms, physical exam findings, and initial radiographic and laboratory data in the context of their medical condition is felt to place them at decreased risk for further clinical deterioration. Furthermore, it is anticipated that the patient will be medically stable for discharge from the hospital within 2 midnights of admission. The  following factors support the patient status of observation.   " The physical exam findings include heart rate of 40 and High Grade AV block. " The initial radiographic and laboratory data show no reversible cause of High Grade AV block.   For questions or updates, please contact [facility] [place] Please consult [email or web address] for contact info under     Signed, Werner Lean, MD  10/09/2020 6:02 PM

## 2020-10-09 NOTE — Telephone Encounter (Signed)
This nurse called attempted to reach patients spouse per [name] to have patient go to [facility] ED related to EKG.  Unable to reach spouse, this nurse was able to reach and was able to speak with patient and made patient aware that she needs to come [facility].  Patient acknowledged understanding. No further questions or concerns at this time.

## 2020-10-09 NOTE — ED Notes (Signed)
ED Provider at bedside. 

## 2020-10-09 NOTE — ED Notes (Signed)
Pt on monitor, zoll in room, patient on pads. Pt denies pain or shob at this time.

## 2020-10-09 NOTE — ED Notes (Signed)
Tried calling report to floor, unable to take report d/t getting report on the floor.

## 2020-10-09 NOTE — ED Triage Notes (Addendum)
Pt reports she was seen today for CA checkup, called and told to come to [facility] d/t abn EKG and consult with cards.  Per EKG pt has new Gr 3 AVB. Pt denies n/v/d, denies pain, no worsening shob.  Pt states she has had low HR for a few weeks. Asymptomatic

## 2020-10-10 ENCOUNTER — Encounter (HOSPITAL_COMMUNITY): Payer: Self-pay | Admitting: Internal Medicine

## 2020-10-10 ENCOUNTER — Inpatient Hospital Stay (HOSPITAL_COMMUNITY)
Admission: EM | Disposition: A | Discharge status: Home / Self Care | Payer: Self-pay | Source: Ambulatory Visit | Attending: Internal Medicine

## 2020-10-10 ENCOUNTER — Telehealth: Payer: Self-pay | Admitting: Hematology

## 2020-10-10 ENCOUNTER — Inpatient Hospital Stay (HOSPITAL_COMMUNITY): Payer: Medicare Other

## 2020-10-10 DIAGNOSIS — R001 Bradycardia, unspecified: Secondary | ICD-10-CM

## 2020-10-10 DIAGNOSIS — I442 Atrioventricular block, complete: Secondary | ICD-10-CM | POA: Diagnosis not present

## 2020-10-10 HISTORY — PX: PACEMAKER IMPLANT: EP1218

## 2020-10-10 LAB — ECHOCARDIOGRAM COMPLETE
AR max vel: 1.81 cm2
AV Area VTI: 2.11 cm2
AV Area mean vel: 2.27 cm2
AV Mean grad: 3 mmHg
AV Peak grad: 7.4 mmHg
Ao pk vel: 1.36 m/s
Area-P 1/2: 4.89 cm2
Height: 64 in
MV M vel: 2.54 m/s
MV Peak grad: 25.8 mmHg
S' Lateral: 2.5 cm
Single Plane A4C EF: 61.8 %
Weight: 2723.2 oz

## 2020-10-10 LAB — SARS CORONAVIRUS 2 (TAT 6-24 HRS): SARS Coronavirus 2: NEGATIVE

## 2020-10-10 LAB — BASIC METABOLIC PANEL
Anion gap: 8 (ref 5–15)
BUN: 13 mg/dL (ref 8–23)
CO2: 26 mmol/L (ref 22–32)
Calcium: 9.1 mg/dL (ref 8.9–10.3)
Chloride: 103 mmol/L (ref 98–111)
Creatinine, Ser: 0.66 mg/dL (ref 0.44–1.00)
GFR, Estimated: >60 mL/min (ref >60)
Glucose, Bld: 123 mg/dL — ABNORMAL HIGH (ref 70–99)
Potassium: 3.6 mmol/L (ref 3.5–5.1)
Sodium: 137 mmol/L (ref 135–145)

## 2020-10-10 LAB — GLUCOSE, CAPILLARY
Glucose-Capillary: 129 mg/dL — ABNORMAL HIGH (ref 70–99)
Glucose-Capillary: 61 mg/dL — ABNORMAL LOW (ref 70–99)
Glucose-Capillary: 190 mg/dL — ABNORMAL HIGH (ref 70–99)
Glucose-Capillary: 149 mg/dL — ABNORMAL HIGH (ref 70–99)
Glucose-Capillary: 122 mg/dL — ABNORMAL HIGH (ref 70–99)
Glucose-Capillary: 233 mg/dL — ABNORMAL HIGH (ref 70–99)

## 2020-10-10 LAB — CBC
HCT: 36 % (ref 36.0–46.0)
Hemoglobin: 11.8 g/dL — ABNORMAL LOW (ref 12.0–15.0)
MCH: 30.3 pg (ref 26.0–34.0)
MCHC: 32.8 g/dL (ref 30.0–36.0)
MCV: 92.3 fL (ref 80.0–100.0)
Platelets: 183 10*3/uL (ref 150–400)
RBC: 3.9 MIL/uL (ref 3.87–5.11)
RDW: 13.4 % (ref 11.5–15.5)
WBC: 4.3 10*3/uL (ref 4.0–10.5)
nRBC: 0 % (ref 0.0–0.2)

## 2020-10-10 LAB — SURGICAL PCR SCREEN
MRSA, PCR: NEGATIVE
Staphylococcus aureus: NEGATIVE

## 2020-10-10 LAB — LIPID PANEL
Cholesterol: 155 mg/dL (ref 0–200)
HDL: 49 mg/dL (ref >40)
LDL Cholesterol: 98 mg/dL (ref 0–99)
Total CHOL/HDL Ratio: 3.2 RATIO
Triglycerides: 41 mg/dL (ref <150)
VLDL: 8 mg/dL (ref 0–40)

## 2020-10-10 LAB — TSH: TSH: 2.635 u[IU]/mL (ref 0.350–4.500)

## 2020-10-10 SURGERY — PACEMAKER IMPLANT

## 2020-10-10 MED ORDER — FENTANYL CITRATE (PF) 100 MCG/2ML IJ SOLN
INTRAMUSCULAR | Status: AC
  Filled 2020-10-10: qty 2

## 2020-10-10 MED ORDER — SODIUM CHLORIDE 0.9% FLUSH: 3.0000 mL | INTRAVENOUS | Status: DC | PRN

## 2020-10-10 MED ORDER — DEXTROSE 50 % IV SOLN
12.5000 g | Freq: Once | INTRAVENOUS | Status: AC
  Administered 2020-10-10: 12.5 g via INTRAVENOUS

## 2020-10-10 MED ORDER — MIDAZOLAM HCL 5 MG/5ML IJ SOLN
INTRAMUSCULAR | Status: DC | PRN
  Administered 2020-10-10 (×2): 1 mg via INTRAVENOUS

## 2020-10-10 MED ORDER — CHLORHEXIDINE GLUCONATE 4 % EX LIQD
60.0000 mL | Freq: Once | CUTANEOUS | Status: DC
  Filled 2020-10-10: qty 45

## 2020-10-10 MED ORDER — CEFAZOLIN SODIUM-DEXTROSE 1-4 GM/50ML-% IV SOLN
1.0000 g | Freq: Four times a day (QID) | INTRAVENOUS | Status: AC
  Administered 2020-10-10 – 2020-10-11 (×3): 1 g via INTRAVENOUS
  Filled 2020-10-10 (×3): qty 50

## 2020-10-10 MED ORDER — SODIUM CHLORIDE 0.9% FLUSH
3.0000 mL | Freq: Two times a day (BID) | INTRAVENOUS | Status: DC
  Administered 2020-10-10 – 2020-10-11 (×3): 3 mL via INTRAVENOUS

## 2020-10-10 MED ORDER — CEFAZOLIN SODIUM-DEXTROSE 2-4 GM/100ML-% IV SOLN
INTRAVENOUS | Status: AC
  Filled 2020-10-10: qty 100

## 2020-10-10 MED ORDER — HEPARIN (PORCINE) IN NACL 1000-0.9 UT/500ML-% IV SOLN
INTRAVENOUS | Status: DC | PRN
  Administered 2020-10-10: 500 mL

## 2020-10-10 MED ORDER — GENTAMICIN SULFATE 40 MG/ML IJ SOLN
80.0000 mg | INTRAMUSCULAR | Status: AC
  Administered 2020-10-10: 80 mg
  Filled 2020-10-10: qty 2

## 2020-10-10 MED ORDER — CHLORHEXIDINE GLUCONATE 4 % EX LIQD
60.0000 mL | Freq: Once | CUTANEOUS | Status: AC
  Administered 2020-10-10: 4 via TOPICAL

## 2020-10-10 MED ORDER — LIDOCAINE HCL (PF) 1 % IJ SOLN
INTRAMUSCULAR | Status: DC | PRN
  Administered 2020-10-10: 50 mL

## 2020-10-10 MED ORDER — MIDAZOLAM HCL 5 MG/5ML IJ SOLN
INTRAMUSCULAR | Status: AC
  Filled 2020-10-10: qty 5

## 2020-10-10 MED ORDER — SODIUM CHLORIDE 0.9 % IV SOLN
INTRAVENOUS | Status: AC
  Filled 2020-10-10: qty 2

## 2020-10-10 MED ORDER — ONDANSETRON HCL 4 MG/2ML IJ SOLN: 4.0000 mg | Freq: Four times a day (QID) | INTRAMUSCULAR | Status: DC | PRN

## 2020-10-10 MED ORDER — SODIUM CHLORIDE 0.9 % IV SOLN: 250.0000 mL | INTRAVENOUS | Status: DC

## 2020-10-10 MED ORDER — LIDOCAINE HCL 1 % IJ SOLN
INTRAMUSCULAR | Status: AC
  Filled 2020-10-10: qty 60

## 2020-10-10 MED ORDER — DEXTROSE 50 % IV SOLN
INTRAVENOUS | Status: AC
  Filled 2020-10-10: qty 50

## 2020-10-10 MED ORDER — HEPARIN (PORCINE) IN NACL 1000-0.9 UT/500ML-% IV SOLN
INTRAVENOUS | Status: AC
  Filled 2020-10-10: qty 500

## 2020-10-10 MED ORDER — SODIUM CHLORIDE 0.9 % IV SOLN: INTRAVENOUS | Status: DC

## 2020-10-10 MED ORDER — ACETAMINOPHEN 325 MG PO TABS
325.0000 mg | ORAL_TABLET | ORAL | Status: DC | PRN
Start: 2020-10-10 — End: 2020-10-11

## 2020-10-10 MED ORDER — FENTANYL CITRATE (PF) 100 MCG/2ML IJ SOLN
INTRAMUSCULAR | Status: DC | PRN
  Administered 2020-10-10 (×2): 12.5 ug via INTRAVENOUS

## 2020-10-10 MED ORDER — CEFAZOLIN SODIUM-DEXTROSE 2-4 GM/100ML-% IV SOLN
2.0000 g | INTRAVENOUS | Status: AC
Start: 2020-10-10 — End: 2020-10-10
  Administered 2020-10-10: 2 g via INTRAVENOUS
  Filled 2020-10-10: qty 100

## 2020-10-10 SURGICAL SUPPLY — 7 items
CABLE SURGICAL S-101-97-12 (CABLE) ×2 IMPLANT
LEAD TENDRIL MRI 46CM LPA1200M (Lead) ×2 IMPLANT
LEAD TENDRIL MRI 52CM LPA1200M (Lead) ×2 IMPLANT
PACEMAKER ASSURITY DR-RF (Pacemaker) ×2 IMPLANT
PAD PRO RADIOLUCENT 2001M-C (PAD) ×2 IMPLANT
SHEATH 8FR PRELUDE SNAP 13 (SHEATH) ×4 IMPLANT
TRAY PACEMAKER INSERTION (PACKS) ×2 IMPLANT

## 2020-10-10 NOTE — Plan of Care (Signed)
POC initiated and progressing. 

## 2020-10-10 NOTE — Telephone Encounter (Signed)
Left message with follow-up appointment per 9/7 los. 

## 2020-10-10 NOTE — Discharge Summary (Addendum)
DISCHARGE SUMMARY    Patient ID: Penny Huynh,  MRN: [id number], DOB/AGE: 1933-02-25 87 y.o.  Admit date: 10/09/2020 Discharge date: 10/11/20  Primary Care Physician: Manon Hilding, MD  Electrophysiologist: new, Dr. Lovena Le  Primary Discharge Diagnosis:  CHB  Secondary Discharge Diagnosis:  HTN DM Breast cancer history  No Known Allergies   Procedures This Admission:  1.  Implantation of a Abbott dual chamber PPM on 10/10/20 by Dr Lovena Le.  See procedure report for full details There were no immediate post procedure complications. CXR on 10/11/20 demonstrated no pneumothorax status post device implantation.   Brief HPI: Penny Huynh is a 87 y.o. female  with a hx of mild non obstructive CAD s/p LHC in 2009, HTN with DM, L breast cancer s/p Taxol, Herceptin (completed March 2020) and XRT was incidentally found to have CHB via her oncologist office and referred to the [facility] Course:  The patient was admitted, labs were largely unremarkable, no reversible cause found for her CHB, TTE noted preserved LVEF and she underwent implantation of a 10/10/20 with details as outlined above.  She was monitored on telemetry overnight which demonstrated SR, V pacing.  Right chest was without hematoma or ecchymosis.  The device was interrogated and found to be functioning normally.  CXR was obtained and demonstrated no pneumothorax status post device implantation.  Wound care, arm mobility, and restrictions were reviewed with the patient.  The patient feels well, denies any P/SOB, with  minimal site discomfort.  She was examined by Dr. Lovena Le and considered stable for discharge to home.    Physical Exam: Vitals:   10/10/20 2310 10/10/20 2311 10/11/20 0322 10/11/20 0856  BP: 97/66  95/76 111/85  Pulse: 83  76 86  Resp: (!) '21 20 17 18  '$ Temp: 98.1 F (36.7 C)  98.7 F (37.1 C) 99.8 F (37.7 C)  TempSrc: Oral  Oral Oral  SpO2: 100%  100% 100%  Weight:      Height:         GEN- The patient is well appearing, alert and oriented x 3 today.   HEENT: normocephalic, atraumatic; sclera clear, conjunctiva pink; hearing intact; oropharynx clear; neck supple, no JVP Lungs-  CTA b/l, normal work of breathing.  No wheezes, rales, rhonchi Heart- RRR, no murmurs, rubs or gallops, PMI not laterally displaced GI- soft, non-tender, non-distended Extremities- no clubbing, cyanosis, or edema MS- no significant deformity or atrophy Skin- warm and dry, no rash or lesion, right chest without hematoma/ecchymosis Psych- euthymic mood, full affect Neuro- no gross deficits   Labs:   Lab Results  Component Value Date   WBC 4.3 10/10/2020   HGB 11.8 (L) 10/10/2020   HCT 36.0 10/10/2020   MCV 92.3 10/10/2020   PLT 183 10/10/2020    Recent Labs  Lab 10/09/20 1034 10/09/20 1720 10/10/20 0127  NA 139   < > 137  K 3.9   < > 3.6  CL 105   < > 103  CO2 24   < > 26  BUN 15   < > 13  CREATININE 0.88   < > 0.66  CALCIUM 9.4   < > 9.1  PROT 6.6  --   --   BILITOT 1.0  --   --   ALKPHOS 44  --   --   ALT 11  --   --   AST 14*  --   --   GLUCOSE 183*   < >  123*   < > = values in this interval not displayed.    Discharge Medications:  Allergies as of 10/11/2020   No Known Allergies      Medication List     TAKE these medications    albuterol 108 (90 Base) MCG/ACT inhaler Commonly known as: VENTOLIN HFA Inhale 1 puff into the lungs every 6 (six) hours as needed for wheezing or shortness of breath.   alendronate 70 MG tablet Commonly known as: FOSAMAX Take 70 mg by mouth once a week.   amLODipine 5 MG tablet Commonly known as: NORVASC Take 1 tablet (5 mg total) by mouth daily.   anastrozole 1 MG tablet Commonly known as: ARIMIDEX Take 1 tablet (1 mg total) by mouth daily.   aspirin 81 MG EC tablet Take 81 mg by mouth at bedtime.   chlorthalidone 25 MG tablet Commonly known as: HYGROTON Take 25 mg by mouth daily.   colchicine 0.6 MG tablet Take  0.6 mg by mouth 4 (four) times daily as needed (gout flare).   CoQ10 100 MG Caps Take 100 mg by mouth daily.   latanoprost 0.005 % ophthalmic solution Commonly known as: XALATAN Place 1 drop into both eyes at bedtime.   losartan 100 MG tablet Commonly known as: COZAAR Take 100 mg by mouth daily.   metFORMIN 500 MG tablet Commonly known as: GLUCOPHAGE Take 1,000 mg by mouth daily.   pregabalin 150 MG capsule Commonly known as: LYRICA TAKE ONE CAPSULE BY MOUTH TWICE A DAY   rosuvastatin 5 MG tablet Commonly known as: CRESTOR Take 5 mg by mouth every other day.   vitamin B-12 1000 MCG tablet Commonly known as: CYANOCOBALAMIN Take 1,000 mcg by mouth daily.   Vitamin D (Cholecalciferol) 25 MCG (1000 UT) Caps Take 25 mcg by mouth daily.               Discharge Care Instructions  (From admission, onward)           Start     Ordered   10/11/20 0000  Discharge wound care:       Comments: As per AVS   10/11/20 1146            Disposition: Home Discharge Instructions     Diet - low sodium heart healthy   Complete by: As directed    Discharge wound care:   Complete by: As directed    As per AVS   Increase activity slowly   Complete by: As directed        Follow-up Information     [facility] Follow up.   Specialty: Cardiology Why: 10/24/20 @ 10:00AM, wound check visit Contact information: [address], [address] [place]        Evans Lance, MD Follow up.   Specialty: Cardiology Why: 01/14/21 @ 2:45PM Contact information: [address] [address] [phone]                 Duration of Discharge Encounter: Greater than 30 minutes including physician time.  Venetia Night, PA-C 10/11/2020 11:46 AM  EP Attending  Patient seen and examined. Agree with the findings as noted above. The patient is stable for DC home. CXR is good  and PM interrogation under my direction demonstrates normal DDD PM function.   Carleene Overlie Taron Conrey,MD

## 2020-10-10 NOTE — Consult Note (Addendum)
Cardiology Consultation:   Patient ID: Penny Huynh MRN: [id number]; DOB: 1934-02-01  Admit date: 10/09/2020 Date of Consult: 10/10/2020  PCP:  Manon Hilding, MD   [facility] Cardiologist:  Kate Sable, MD (Inactive) (last May 2021)  Patient Profile:   Penny Huynh is a 87 y.o. female with a hx of mild non obstructive CAD s/p LHC in 2009, HTN with DM, L breast cancer s/p Taxol, Herceptin (completed March 2020) and XRT,  being seen for CHB at the request of Dr. Gasper Sells.  History of Present Illness:   Penny Huynh was recently referred to pulmonology in July with some abrupt/new onset DOE over the prior few months that lasted a few weeks and by the time seen was resolved and suspected perhaps asthma with hx of asthmatic bronchitis.planned for PFTs and albuterol PRN   She was seen at a routine visit with her oncologist yesterday when her EKG noted CHB without symptoms and she was referred to the ER for further evaluation and management.  LABS K+ 4.2 > 3.6 Mag 1.8 BUN/Creat 13/0.66 WBC 4.3 H/H 11/36 Plts 183 TSH 2.635  She feels OK, no CP< palpitations or cardiac awareness.  No dizzy spells, near syncope or syncope. Generally has felt in the last 4-34mothat she has slowed, did have a couple weeks in June that she could barely walk across the room without SOB and having to rest.  This just seemed to resolve on it's own.   Past Medical History:  Diagnosis Date   Arthritis    "maybe a little in my left knee" (02/23/2017)   Asthmatic bronchitis    Cancer of left breast [place]   Coronary atherosclerosis    Minor nonobstructive CAD at cardiac catheterization 2009   Dyslipidemia    Femoral bruit 7/08   With normal ABIs   Hypertension    Iliac artery aneurysm (HCC)    Lower extremity edema    Chronic   Pneumonia ~ 1950   in 8th grade   Type II diabetes mellitus ([facility]     Past Surgical History:  Procedure Laterality Date   APPENDECTOMY      BREAST BIOPSY Left 02/2017   CARDIAC CATHETERIZATION     "long time ago" (02/23/2017)   MASTECTOMY COMPLETE / SIMPLE W/ SENTINEL NODE BIOPSY Left 02/23/2017   TOTAL MATECTOMY;  LEFT AXILLARY SENTINEL LYMPH NODE BIOPSY ERAS PATHWAY   MASTECTOMY WITH RADIOACTIVE SEED GUIDED EXCISION AND AXILLARY SENTINEL LYMPH NODE BIOPSY Left 02/23/2017   Procedure: LEFT TOTAL MATECTOMY;  LEFT AXILLARY SENTINEL LYMPH NODE BIOPSY ERAS PATHWAY;  Surgeon: HExcell Seltzer MD;  [facility]  Service: General;  Laterality: Left;   PILONIDAL CYST EXCISION     PORT-A-CATH REMOVAL Right 09/27/2018   Procedure: REMOVAL PORT-A-CATH;  Surgeon: IFanny Skates MD;  [facility]  Service: General;  Laterality: Right;   PORTA CATH INSERTION  02/23/2017   PORTACATH PLACEMENT N/A 02/23/2017   Procedure: INSERTION PORT-A-CATH;  Surgeon: HExcell Seltzer MD;  [facility]  Service: General;  Laterality: N/A;   SHOULDER ARTHROSCOPY W/ ROTATOR CUFF REPAIR Right      Home Medications:  Prior to Admission medications   Medication Sig Start Date End Date Taking? Authorizing Provider  albuterol (VENTOLIN HFA) 108 (90 Base) MCG/ACT inhaler Inhale 1 puff into the lungs every 6 (six) hours as needed for wheezing or shortness of breath.    [provider]  alendronate (FOSAMAX) 70 MG tablet Take 70  mg by mouth once a week. 03/16/19   [provider]  amLODipine (NORVASC) 5 MG tablet Take 1 tablet (5 mg total) by mouth daily. 04/04/19 07/03/19  [name], MD  anastrozole (ARIMIDEX) 1 MG tablet Take 1 tablet (1 mg total) by mouth daily. 04/05/19   Truitt Merle, MD  aspirin 81 MG EC tablet Take 81 mg by mouth at bedtime.    [provider]  chlorthalidone (HYGROTON) 25 MG tablet Take 25 mg by mouth daily.    [provider]  Coenzyme Q10 (COQ10) 100 MG CAPS Take 100 mg by mouth daily.    [provider]  colchicine 0.6 MG tablet Take 0.6 mg by mouth 4  (four) times daily as needed (gout flare). 09/23/20   [provider]  latanoprost (XALATAN) 0.005 % ophthalmic solution Place 1 drop into both eyes at bedtime. 08/13/20   [provider]  losartan (COZAAR) 100 MG tablet Take 100 mg by mouth daily. 06/11/19   [provider]  metFORMIN (GLUCOPHAGE) 500 MG tablet Take 500 mg by mouth 2 (two) times daily with a meal.    [provider]  pregabalin (LYRICA) 150 MG capsule TAKE ONE CAPSULE BY MOUTH TWICE A DAY Patient taking differently: Take 150 mg by mouth 2 (two) times daily. 08/06/20   Ventura Sellers, MD  rosuvastatin (CRESTOR) 5 MG tablet Take 5 mg by mouth every other day. 03/16/19   [provider]  vitamin B-12 (CYANOCOBALAMIN) 1000 MCG tablet Take 1,000 mcg by mouth daily.    [provider]  Vitamin D, Cholecalciferol, 25 MCG (1000 UT) CAPS Take 25 mcg by mouth daily.    [provider]    Inpatient Medications: Scheduled Meds:  amLODipine  5 mg Oral Daily   anastrozole  1 mg Oral Daily   aspirin EC  81 mg Oral QHS   chlorthalidone  25 mg Oral Daily   heparin  5,000 Units Subcutaneous Q8H   insulin aspart  0-15 Units Subcutaneous TID WC   latanoprost  1 drop Both Eyes QHS   losartan  100 mg Oral Daily   pregabalin  150 mg Oral BID   rosuvastatin  5 mg Oral QODAY   Continuous Infusions:  PRN Meds: acetaminophen, albuterol, colchicine, hydrALAZINE, ondansetron (ZOFRAN) IV  Allergies:   No Known Allergies  Social History:   Social History   Socioeconomic History   Marital status: Married    Spouse name: Not on file   Number of children: 2   Years of education: Not on file   Highest education level: Not on file  Occupational History   Occupation: tax Therapist, nutritional for [employer]    Comment: only works during tax season but not currently    Occupation: in Dentist    Occupation: retired Mining engineer of education     Comment: 1997  Tobacco Use   Smoking status: Former     Packs/day: 2.00    Years: 46.00    Pack years: 92.00    Types: Cigarettes    Start date: 11/09/1953    Quit date: 02/03/1991    Years since quitting: 29.7   Smokeless tobacco: Never  Vaping Use   Vaping Use: Never used  Substance and Sexual Activity   Alcohol use: Yes    Alcohol/week: 2.0 standard drinks    Types: 2 Glasses of wine per week    Comment: social   Drug use: No   Sexual activity: Not on file  Other Topics Concern   Not on file  Social History Narrative   Lives in [place] with her husband.   Has 2 grown children.   10-25-17 Unable to ask abuse questions husband with her today.   Social Determinants of Health   Financial Resource Strain: Not on file  Food Insecurity: Not on file  Transportation Needs: Not on file  Physical Activity: Not on file  Stress: Not on file  Social Connections: Not on file  Intimate Partner Violence: Not on file    Family History:   Family History  Problem Relation Age of Onset   Diabetes Mother    Stroke Father    Cancer Father        unknown type      ROS:  Please see the history of present illness.  All other ROS reviewed and negative.     Physical Exam/Data:   Vitals:   10/09/20 2030 10/09/20 2056 10/09/20 2350 10/10/20 0358  BP:  (!) 193/63 (!) 163/39 (!) 164/57  Pulse:  (!) 41 (!) 37 (!) 43  Resp:  '18 17 16  '$ Temp: 98.3 F (36.8 C) 99.3 F (37.4 C) (!) 97.4 F (36.3 C) 97.9 F (36.6 C)  TempSrc: Oral Oral Oral Oral  SpO2:  100% 96% 100%  Weight:  77.2 kg    Height:  '5\' 4"'$  (1.626 m)     No intake or output data in the 24 hours ending 10/10/20 0804 Last 3 Weights 10/09/2020 10/09/2020 08/20/2020  Weight (lbs) 170 lb 3.2 oz 174 lb 1.6 oz 167 lb 6.4 oz  Weight (kg) 77.202 kg 78.971 kg 75.932 kg     Body mass index is 29.21 kg/m.  General:  Well nourished, well developed, in no acute distress HEENT: normal Lymph: no adenopathy Neck: no JVD Endocrine:  No thryomegaly Vascular: No carotid bruits  Cardiac:  RRR;  bradycardic no murmurs, gallops or rubs Lungs:  CTA b/l, no wheezing, rhonchi or rales  Abd: soft, nontender, no hepatomegaly  Ext: no edema Musculoskeletal:  No deformities Skin: warm and dry  Neuro:  no focal abnormalities noted Psych:  Normal affect   EKG:  The EKG was personally reviewed and demonstrates:    CHB 43bpm, alternating BBB CHB 44bpm CHB 41bpm, alternating BBB  OLD 04/04/19: SR 78, borderline 1st degree AVblock, 260m, RBBB  Telemetry:  Telemetry was personally reviewed and demonstrates:   CHB 30's  Relevant CV Studies:  Updated echo is ordered and pending  04/19/19: TTE IMPRESSIONS   1. Left ventricular ejection fraction, by estimation, is 60 to 65%. The  left ventricle has normal function. The left ventricle has no regional  wall motion abnormalities. Left ventricular diastolic parameters are  consistent with Grade I diastolic  dysfunction (impaired relaxation).   2. Right ventricular systolic function is normal. The right ventricular  size is normal. There is normal pulmonary artery systolic pressure.   3. The mitral valve is normal in structure. Mild mitral valve  regurgitation. No evidence of mitral stenosis.   4. The aortic valve has an indeterminant number of cusps. Aortic valve  regurgitation is not visualized. No aortic stenosis is present.   5. The inferior vena cava is normal in size with greater than 50%  respiratory variability, suggesting right atrial pressure of 3 mmHg.   Comparison(s): Echocardiogram done 07/08/18 showed an EF of 55%.   Laboratory Data:  High Sensitivity Troponin:   Recent Labs  Lab 10/09/20 1720  TROPONINIHS 6  Chemistry Recent Labs  Lab 10/09/20 1034 10/09/20 1720 10/09/20 2057 10/10/20 0127  NA 139 137  --  137  K 3.9 4.2  --  3.6  CL 105 103  --  103  CO2 24 25  --  26  GLUCOSE 183* 173*  --  123*  BUN 15 17  --  13  CREATININE 0.88 0.92 0.74 0.66  CALCIUM 9.4 9.4  --  9.1  GFRNONAA >60 >60 >60 >60   ANIONGAP 10 9  --  8    Recent Labs  Lab 10/09/20 1034  PROT 6.6  ALBUMIN 3.7  AST 14*  ALT 11  ALKPHOS 44  BILITOT 1.0   Hematology Recent Labs  Lab 10/09/20 1720 10/09/20 2057 10/10/20 0127  WBC 4.5 4.4 4.3  RBC 4.11 4.13 3.90  HGB 12.3 12.4 11.8*  HCT 39.7 38.6 36.0  MCV 96.6 93.5 92.3  MCH 29.9 30.0 30.3  MCHC 31.0 32.1 32.8  RDW 13.6 13.5 13.4  PLT 196 186 183   BNPNo results for input(s): BNP, PROBNP in the last 168 hours.  DDimer No results for input(s): DDIMER in the last 168 hours.   Radiology/Studies:  No results found.   Assessment and Plan:   CHB No reversible causes RBBB at baseline, she has alternating BBB in CHB March 2021 (1 year POST chemo) w/preserved LVEF new echo has been ordered  She is s/p L mastectomy, will plan R sided implant Suspect her unusual DOE a couple months ago may have been CHB that perhaps resolved until now again  Dr. Lovena Le has seen her this morning Planned for PPM   Risk Assessment/Risk Scores:     For questions or updates, please contact [facility] Please consult [email or web address] for contact info under    Signed, Baldwin Jamaica, PA-C  10/10/2020 8:04 AM  EP Attending  Patient seen and examined. Agree with above. The patient presents with CHB. She is on no medical therapy. She has a h/o breast CA and is s/p chemotherapy. I have discussed the indications/risks/benefits/goals/expectations of DDD PM insertion and she wishes to proceed. With her h/o left sided breast CA, we will plan for a right sided implant.    Carleene Overlie Erick Murin,MD

## 2020-10-10 NOTE — Progress Notes (Signed)
Pt came back to rm 9 from cath lab. Reinitiated pt on tele. Vss. Call bell within reach.   Lavenia Atlas, RN

## 2020-10-10 NOTE — Progress Notes (Addendum)
Pt' s CBG=61, asymptomatic. Dextrose 50 [id number] g given per policy. Rechecked CBG=190.  Bp=195/62. 10 mg hydralazine given. Rechecked Bp=121/63   EKG obtained and placed in pt's chart.   Obtained consent form and placed in pt's chart.   CHG wipe performed.   Lavenia Atlas, RN

## 2020-10-10 NOTE — Discharge Instructions (Addendum)
    Supplemental Discharge Instructions for  Pacemaker/Defibrillator Patients   Activity No heavy lifting or vigorous activity with your right arm for 6 to 8 weeks.  Do not raise your right arm above your head for one week.  Gradually raise your affected arm as drawn below.             10/15/20                     10/16/20                   10/17/20                   10/18/20 __  NO DRIVING for  1 week   ; you may begin driving on  [id number] .  WOUND CARE Keep the wound area clean and dry.  Do not get this area wet , no showers for one week; you may shower on 10/18/20  . The tape/steri-strips on your wound will fall off; do not pull them off.  No bandage is needed on the site.  DO  NOT apply any creams, oils, or ointments to the wound area. If you notice any drainage or discharge from the wound, any swelling or bruising at the site, or you develop a fever > 101? F after you are discharged home, call the office at once.  Special Instructions You are still able to use cellular telephones; use the ear opposite the side where you have your pacemaker/defibrillator.  Avoid carrying your cellular phone near your device. When traveling through airports, show security personnel your identification card to avoid being screened in the metal detectors.  Ask the security personnel to use the hand wand. Avoid arc welding equipment, MRI testing (magnetic resonance imaging), TENS units (transcutaneous nerve stimulators).  Call the office for questions about other devices. Avoid electrical appliances that are in poor condition or are not properly grounded. Microwave ovens are safe to be near or to operate.

## 2020-10-11 ENCOUNTER — Encounter (HOSPITAL_COMMUNITY): Payer: Self-pay | Admitting: Internal Medicine

## 2020-10-11 ENCOUNTER — Inpatient Hospital Stay (HOSPITAL_COMMUNITY): Payer: Medicare Other

## 2020-10-11 LAB — GLUCOSE, CAPILLARY: Glucose-Capillary: 125 mg/dL — ABNORMAL HIGH (ref 70–99)

## 2020-10-11 MED FILL — Lidocaine HCl Local Inj 1%: INTRAMUSCULAR | Qty: 50 | Status: AC

## 2020-10-11 NOTE — Progress Notes (Signed)
Patient discharging home with husband. Discharge orders reviewed. Patient verbalized understanding. VS stable She is leaving via private vehicle. Fuller Canada, RN

## 2020-10-16 ENCOUNTER — Ambulatory Visit
Admission: RE | Admit: 2020-10-16 | Discharge: 2020-10-16 | Disposition: A | Discharge status: Home / Self Care | Payer: Medicare Other | Source: Ambulatory Visit | Attending: Hematology | Admitting: Hematology

## 2020-10-16 ENCOUNTER — Other Ambulatory Visit: Payer: Self-pay

## 2020-10-16 DIAGNOSIS — E2839 Other primary ovarian failure: Secondary | ICD-10-CM

## 2020-10-16 DIAGNOSIS — Z780 Asymptomatic menopausal state: Secondary | ICD-10-CM | POA: Diagnosis not present

## 2020-10-16 DIAGNOSIS — M85832 Other specified disorders of bone density and structure, left forearm: Secondary | ICD-10-CM | POA: Diagnosis not present

## 2020-10-17 ENCOUNTER — Ambulatory Visit (INDEPENDENT_AMBULATORY_CARE_PROVIDER_SITE_OTHER): Payer: Medicare Other | Admitting: Pulmonary Disease

## 2020-10-17 ENCOUNTER — Encounter: Payer: Self-pay | Admitting: Pulmonary Disease

## 2020-10-17 VITALS — BP 142/70 | HR 70 | Temp 97.2°F | Ht 64.0 in | Wt 170.0 lb

## 2020-10-17 DIAGNOSIS — J4520 Mild intermittent asthma, uncomplicated: Secondary | ICD-10-CM | POA: Diagnosis not present

## 2020-10-17 DIAGNOSIS — R0602 Shortness of breath: Secondary | ICD-10-CM | POA: Diagnosis not present

## 2020-10-17 DIAGNOSIS — R0600 Dyspnea, unspecified: Secondary | ICD-10-CM

## 2020-10-17 DIAGNOSIS — R0609 Other forms of dyspnea: Secondary | ICD-10-CM

## 2020-10-17 LAB — PULMONARY FUNCTION TEST
DL/VA % pred: 111 %
DL/VA: 4.51 ml/min/mmHg/L
DLCO cor % pred: 73 %
DLCO cor: 13.59 ml/min/mmHg
DLCO unc % pred: 69 %
DLCO unc: 12.87 ml/min/mmHg
FEF 25-75 Post: 3.05 L/sec
FEF 25-75 Pre: 1.51 L/sec
FEF2575-%Change-Post: 101 %
FEF2575-%Pred-Post: 262 %
FEF2575-%Pred-Pre: 130 %
FEV1-%Change-Post: 17 %
FEV1-%Pred-Post: 108 %
FEV1-%Pred-Pre: 91 %
FEV1-Post: 1.56 L
FEV1-Pre: 1.33 L
FEV1FVC-%Change-Post: 2 %
FEV1FVC-%Pred-Pre: 112 %
FEV6-%Change-Post: 14 %
FEV6-%Pred-Post: 101 %
FEV6-%Pred-Pre: 88 %
FEV6-Post: 1.82 L
FEV6-Pre: 1.59 L
FEV6FVC-%Pred-Post: 104 %
FEV6FVC-%Pred-Pre: 104 %
FVC-%Change-Post: 14 %
FVC-%Pred-Post: 97 %
FVC-%Pred-Pre: 84 %
FVC-Post: 1.82 L
FVC-Pre: 1.59 L
Post FEV1/FVC ratio: 86 %
Post FEV6/FVC ratio: 100 %
Pre FEV1/FVC ratio: 84 %
Pre FEV6/FVC Ratio: 100 %
RV % pred: 72 %
RV: 1.79 L
TLC % pred: 73 %
TLC: 3.73 L

## 2020-10-17 NOTE — Patient Instructions (Signed)
Nice to see you again  I am glad your breathing is stable  Your pulmonary function tests are consistent with asthma.  You had a big increase or improvement after albuterol.  Since your breathing is relatively stable, I am okay to continue the albuterol as needed.  No medication changes today.  Return to clinic in 1 year or sooner as needed

## 2020-10-17 NOTE — Progress Notes (Signed)
Full PFT performed today. °

## 2020-10-17 NOTE — Patient Instructions (Signed)
Full PFT performed today. °

## 2020-10-17 NOTE — Progress Notes (Signed)
$'@Patient'Q$  ID: Penny Huynh, female    DOB: [birth year], 87 y.o.   MRN: [id number]  Chief Complaint  Patient presents with   Follow-up    PFT    Referring provider: Manon Hilding, MD  HPI:   87 y.o. woman whom we are seeing in follow-up for evaluation of dyspnea exertion felt to be related to asthma.  She is doing well.  No respiratory complaints.  Breathing remained stable.  Is using albuterol as needed.  Sometimes uses 2 or 3 times a week.  Then can go many weeks or months without use.  Satisfied with control of her breathing.  PFTs performed today.  Discussed in detail the results.  Essentially normal with good response to albuterol indicative of asthma.  Discussed treatment options of continued albuterol.  Versus maintenance inhaler.  She desires to continue with albuterol as needed given long stretches where she does not need to use the medication.  HPI initial visit She reports onset of dyspnea on exertion about 3 to 4 months ago.  Lasted a few weeks.  Significant dyspnea walking around the house.  Needed to rest from room to room.  Big change from baseline.  Often walks outside under a daily basis.  1 to 2 miles.  No intervention.  Reported no chest imaging at that time.  Not tried any medicines.  Symptoms self resolved in a few weeks.  She is back to walking.  Very active.  Cannot walk quite as far she could 6 months ago.  Denies any significant dyspnea.  No shortness of breath.  Thinks things are essentially 100% normal.  Again, she cannot identify any alleviating or exacerbating factors of the time of dyspnea.  She did not have significant cough that is typical of her asthmatic bronchitis flares in the past.  Denies any sore throat, cough, fever-no respiratory infection symptoms.  Did not try any medications.  Does not think pollens contribute much although looking back at it was during the spring so is possible.  PMH: Hypertension, asthma, diabetes, breast cancer in  remission Surgical history: Breast surgery 02/2017, appendectomy 1955 Family history: History of cancer first-degree relatives, denies respiratory issues in first-degree relatives Social history: Former smoker, 90+ pack year smoking history, quit in the 26s   Questionaires / Pulmonary Flowsheets:   ACT:  No flowsheet data found.  MMRC: No flowsheet data found.  Epworth:  No flowsheet data found.  Tests:   FENO:  No results found for: NITRICOXIDE  PFT: 10/17/2020 personally reviewed and interpreted as normal spirometry with significant bronchodilator response in both FEV1 and FVC.  TLC just below normal.  DLCO when corrected just below lower normal.  Suspect DLCO is underestimated given TLC use to calculate is less than 85% of TLC reported in lung volumes.Penny Huynh  WALK:  No flowsheet data found.  Imaging: Personally reviewed and as per EMR discussion this note  Lab Results: Personally reviewed, no anemia CBC    Component Value Date/Time   WBC 4.3 10/10/2020 0127   RBC 3.90 10/10/2020 0127   HGB 11.8 (L) 10/10/2020 0127   HGB 12.4 10/09/2020 1034   HCT 36.0 10/10/2020 0127   PLT 183 10/10/2020 0127   PLT 191 10/09/2020 1034   MCV 92.3 10/10/2020 0127   MCH 30.3 10/10/2020 0127   MCHC 32.8 10/10/2020 0127   RDW 13.4 10/10/2020 0127   LYMPHSABS 1.8 10/09/2020 1720   MONOABS 0.4 10/09/2020 1720   EOSABS 0.2 10/09/2020 1720   BASOSABS  0.1 10/09/2020 1720    BMET    Component Value Date/Time   NA 137 10/10/2020 0127   K 3.6 10/10/2020 0127   CL 103 10/10/2020 0127   CO2 26 10/10/2020 0127   GLUCOSE 123 (H) 10/10/2020 0127   BUN 13 10/10/2020 0127   CREATININE 0.66 10/10/2020 0127   CREATININE 0.88 10/09/2020 1034   CALCIUM 9.1 10/10/2020 0127   GFRNONAA >60 10/10/2020 0127   GFRNONAA >60 10/09/2020 1034   GFRAA >60 10/06/2019 1014    BNP No results found for: BNP  ProBNP No results found for: PROBNP  Specialty Problems       Pulmonary Problems    DYSPNEA    Qualifier: Diagnosis of  By: Dannielle Burn, MD, Sandrea Matte         No Known Allergies  Immunization History  Administered Date(s) Administered   Moderna Sars-Covid-2 Vaccination 02/14/2019, 03/27/2019, 12/09/2019, 07/11/2020    Past Medical History:  Diagnosis Date   Arthritis    "maybe a little in my left knee" (02/23/2017)   Asthmatic bronchitis    Cancer of left breast ([place]) 2019   Coronary atherosclerosis    Minor nonobstructive CAD at cardiac catheterization 2009   Dyslipidemia    Femoral bruit 7/08   With normal ABIs   Hypertension    Iliac artery aneurysm ([facility])    Lower extremity edema    Chronic   Pneumonia ~ 1950   in 8th grade   Type II diabetes mellitus ([place])     Tobacco History: Social History   Tobacco Use  Smoking Status Former   Packs/day: 2.00   Years: 46.00   Pack years: 92.00   Types: Cigarettes   Start date: 11/09/1953   Quit date: 02/03/1991   Years since quitting: 29.7  Smokeless Tobacco Never   Counseling given: Not Answered   Continue to not smoke  Outpatient Encounter Medications as of 10/17/2020  Medication Sig   albuterol (VENTOLIN HFA) 108 (90 Base) MCG/ACT inhaler Inhale 1 puff into the lungs every 6 (six) hours as needed for wheezing or shortness of breath.   alendronate (FOSAMAX) 70 MG tablet Take 70 mg by mouth once a week.   anastrozole (ARIMIDEX) 1 MG tablet Take 1 tablet (1 mg total) by mouth daily.   aspirin 81 MG EC tablet Take 81 mg by mouth at bedtime.   chlorthalidone (HYGROTON) 25 MG tablet Take 25 mg by mouth daily.   Coenzyme Q10 (COQ10) 100 MG CAPS Take 100 mg by mouth daily.   colchicine 0.6 MG tablet Take 0.6 mg by mouth 4 (four) times daily as needed (gout flare).   latanoprost (XALATAN) 0.005 % ophthalmic solution Place 1 drop into both eyes at bedtime.   losartan (COZAAR) 100 MG tablet Take 100 mg by mouth daily.   metFORMIN (GLUCOPHAGE) 500 MG tablet Take 1,000 mg by mouth daily.   pregabalin (LYRICA) 150  MG capsule TAKE ONE CAPSULE BY MOUTH TWICE A DAY (Patient taking differently: Take 150 mg by mouth 2 (two) times daily.)   rosuvastatin (CRESTOR) 5 MG tablet Take 5 mg by mouth every other day.   vitamin B-12 (CYANOCOBALAMIN) 1000 MCG tablet Take 1,000 mcg by mouth daily.   Vitamin D, Cholecalciferol, 25 MCG (1000 UT) CAPS Take 25 mcg by mouth daily.   amLODipine (NORVASC) 5 MG tablet Take 1 tablet (5 mg total) by mouth daily.   No facility-administered encounter medications on file as of 10/17/2020.     Review of Systems  Review of Systems  N/a Physical Exam  BP (!) 142/70 (BP Location: Right Arm, Patient Position: Sitting, Cuff Size: Normal)   Pulse 70   Temp (!) 97.2 F (36.2 C) (Oral)   Ht '5\' 4"'$  (1.626 m)   Wt 170 lb (77.1 kg)   SpO2 99%   BMI 29.18 kg/m   Wt Readings from Last 5 Encounters:  10/17/20 170 lb (77.1 kg)  10/09/20 170 lb 3.2 oz (77.2 kg)  10/09/20 174 lb 1.6 oz (79 kg)  08/20/20 167 lb 6.4 oz (75.9 kg)  04/17/20 170 lb (77.1 kg)    BMI Readings from Last 5 Encounters:  10/17/20 29.18 kg/m  10/09/20 29.21 kg/m  10/09/20 29.88 kg/m  08/20/20 28.73 kg/m  04/17/20 29.18 kg/m     Physical Exam General: Well-appearing, no acute distress Eyes: EOMI, no icterus Neck: Supple, no JVP appreciated Cardiovascular: Regular rate and rhythm, no murmurs Pulmonary: Clear to auscultation bilaterally, no wheezing, normal work of breathing Abdomen: Nondistended, bowel sounds present MSK: No synovitis, no joint effusion Psych: Normal mood, full affect   Assessment & Plan:   Dyspnea on exertion: Largely resolved, no significant symptoms.    Suspect related to asthma given her history of "asthmatic bronchitis."  High suspicion for this given resolution with 0 intervention.  She denies any symptoms consistent with infection, bacterial or viral at the time.  She denies any concerning cardiac symptoms.  PFTs 9/22 essentially normal with significant bronchodilator  response.  Encouraged her to continue to use albuterol if she felt short of breath.  Asthma: Diagnosed in the past.  Usually with bronchitis once a year.  Adequately treated with as needed albuterol at those times.  Primary symptom being cough and phlegm production.  Albuterol has helped with this.  To continue albuterol as needed.  Consider mains inhaler in the future if symptoms worsen.  Return in about 1 year (around 10/17/2021).   Lanier Clam, MD 10/17/2020

## 2020-10-21 DIAGNOSIS — I10 Essential (primary) hypertension: Secondary | ICD-10-CM | POA: Diagnosis not present

## 2020-10-21 DIAGNOSIS — Z23 Encounter for immunization: Secondary | ICD-10-CM | POA: Diagnosis not present

## 2020-10-24 ENCOUNTER — Ambulatory Visit (INDEPENDENT_AMBULATORY_CARE_PROVIDER_SITE_OTHER): Payer: Medicare Other

## 2020-10-24 ENCOUNTER — Other Ambulatory Visit: Payer: Self-pay

## 2020-10-24 DIAGNOSIS — I442 Atrioventricular block, complete: Secondary | ICD-10-CM | POA: Diagnosis not present

## 2020-10-24 NOTE — Patient Instructions (Addendum)

## 2020-10-25 LAB — CUP PACEART INCLINIC DEVICE CHECK
Battery Remaining Longevity: 78 mo
Battery Voltage: 3.01 V
Brady Statistic RA Percent Paced: 4 %
Brady Statistic RV Percent Paced: 99.94 %
Date Time Interrogation Session: 20220922101300
Implantable Lead Implant Date: 20220908
Implantable Lead Implant Date: 20220908
Implantable Lead Location: 753859
Implantable Lead Location: 753860
Implantable Pulse Generator Implant Date: 20220908
Lead Channel Impedance Value: 537.5 Ohm
Lead Channel Impedance Value: 612.5 Ohm
Lead Channel Pacing Threshold Amplitude: 0.75 V
Lead Channel Pacing Threshold Amplitude: 0.75 V
Lead Channel Pacing Threshold Pulse Width: 0.5 ms
Lead Channel Pacing Threshold Pulse Width: 0.5 ms
Lead Channel Sensing Intrinsic Amplitude: 1 mV
Lead Channel Sensing Intrinsic Amplitude: 7.3 mV
Lead Channel Setting Pacing Amplitude: 3.5 V
Lead Channel Setting Pacing Amplitude: 3.5 V
Lead Channel Setting Pacing Pulse Width: 0.5 ms
Lead Channel Setting Sensing Sensitivity: 2 mV
Pulse Gen Model: 2272
Pulse Gen Serial Number: 6519562

## 2020-10-25 NOTE — Progress Notes (Signed)
Wound check appointment. Steri-strips removed. Wound without redness or edema. Incision edges approximated, wound well healed. Normal device function. Thresholds, sensing, and impedances consistent with implant measurements. Device programmed at 3.5V/auto capture programmed on for extra safety margin until 3 month visit. Histogram distribution appropriate for patient and level of activity. No mode switches or high ventricular rates noted. Patient educated about wound care, arm mobility, lifting restrictions. Patient enrolled in remote monitoring. ROV follow up with Dr. Lovena Le 01/14/21.

## 2020-10-28 ENCOUNTER — Telehealth: Payer: Self-pay | Admitting: *Deleted

## 2020-10-28 NOTE — Telephone Encounter (Signed)
-----   Message from Truitt Merle, MD sent at 10/27/2020 11:26 AM EDT ----- Please let pt know her DEXA result. Her bone density has improved since 2020, good news. Please also fax the result to her PCP who manage her osteoporosis. Thanks   Truitt Merle  10/27/2020

## 2020-10-28 NOTE — Telephone Encounter (Signed)
Per Dr.Feng, called pt with message below. Pt was appreciative and verbalized understanding.  

## 2020-10-28 NOTE — Telephone Encounter (Signed)
Bone Density scan was faxed to [place] office at [phone]

## 2020-11-05 DIAGNOSIS — H401131 Primary open-angle glaucoma, bilateral, mild stage: Secondary | ICD-10-CM | POA: Diagnosis not present

## 2020-11-07 DIAGNOSIS — Z23 Encounter for immunization: Secondary | ICD-10-CM | POA: Diagnosis not present

## 2020-11-10 DIAGNOSIS — E1165 Type 2 diabetes mellitus with hyperglycemia: Secondary | ICD-10-CM | POA: Diagnosis not present

## 2020-11-10 DIAGNOSIS — I442 Atrioventricular block, complete: Secondary | ICD-10-CM | POA: Diagnosis not present

## 2020-11-10 DIAGNOSIS — E7849 Other hyperlipidemia: Secondary | ICD-10-CM | POA: Diagnosis not present

## 2020-11-10 DIAGNOSIS — I10 Essential (primary) hypertension: Secondary | ICD-10-CM | POA: Diagnosis not present

## 2020-11-10 DIAGNOSIS — C50919 Malignant neoplasm of unspecified site of unspecified female breast: Secondary | ICD-10-CM | POA: Diagnosis not present

## 2020-11-14 ENCOUNTER — Telehealth: Payer: Self-pay | Admitting: Hematology

## 2020-11-14 NOTE — Telephone Encounter (Signed)
Rescheduled per provider sch change, pt has been called and confirmed appt

## 2020-11-15 DIAGNOSIS — E782 Mixed hyperlipidemia: Secondary | ICD-10-CM | POA: Diagnosis not present

## 2020-11-15 DIAGNOSIS — E7800 Pure hypercholesterolemia, unspecified: Secondary | ICD-10-CM | POA: Diagnosis not present

## 2020-11-15 DIAGNOSIS — I10 Essential (primary) hypertension: Secondary | ICD-10-CM | POA: Diagnosis not present

## 2020-11-15 DIAGNOSIS — E7801 Familial hypercholesterolemia: Secondary | ICD-10-CM | POA: Diagnosis not present

## 2020-11-15 DIAGNOSIS — E871 Hypo-osmolality and hyponatremia: Secondary | ICD-10-CM | POA: Diagnosis not present

## 2020-11-15 DIAGNOSIS — E1140 Type 2 diabetes mellitus with diabetic neuropathy, unspecified: Secondary | ICD-10-CM | POA: Diagnosis not present

## 2020-11-15 DIAGNOSIS — E1165 Type 2 diabetes mellitus with hyperglycemia: Secondary | ICD-10-CM | POA: Diagnosis not present

## 2020-11-15 DIAGNOSIS — E7849 Other hyperlipidemia: Secondary | ICD-10-CM | POA: Diagnosis not present

## 2020-11-15 DIAGNOSIS — E876 Hypokalemia: Secondary | ICD-10-CM | POA: Diagnosis not present

## 2020-11-21 DIAGNOSIS — E1165 Type 2 diabetes mellitus with hyperglycemia: Secondary | ICD-10-CM | POA: Diagnosis not present

## 2020-11-21 DIAGNOSIS — I10 Essential (primary) hypertension: Secondary | ICD-10-CM | POA: Diagnosis not present

## 2020-11-21 DIAGNOSIS — E871 Hypo-osmolality and hyponatremia: Secondary | ICD-10-CM | POA: Diagnosis not present

## 2020-11-21 DIAGNOSIS — C50919 Malignant neoplasm of unspecified site of unspecified female breast: Secondary | ICD-10-CM | POA: Diagnosis not present

## 2020-11-21 DIAGNOSIS — Z0001 Encounter for general adult medical examination with abnormal findings: Secondary | ICD-10-CM | POA: Diagnosis not present

## 2020-11-21 DIAGNOSIS — R252 Cramp and spasm: Secondary | ICD-10-CM | POA: Diagnosis not present

## 2020-11-21 DIAGNOSIS — E7849 Other hyperlipidemia: Secondary | ICD-10-CM | POA: Diagnosis not present

## 2020-11-21 DIAGNOSIS — E1140 Type 2 diabetes mellitus with diabetic neuropathy, unspecified: Secondary | ICD-10-CM | POA: Diagnosis not present

## 2020-12-15 ENCOUNTER — Other Ambulatory Visit: Payer: Self-pay | Admitting: Hematology

## 2021-01-01 ENCOUNTER — Other Ambulatory Visit: Payer: Self-pay | Admitting: Internal Medicine

## 2021-01-02 ENCOUNTER — Encounter: Payer: Self-pay | Admitting: Hematology

## 2021-01-14 ENCOUNTER — Other Ambulatory Visit: Payer: Self-pay

## 2021-01-14 ENCOUNTER — Ambulatory Visit (INDEPENDENT_AMBULATORY_CARE_PROVIDER_SITE_OTHER): Payer: Medicare Other | Admitting: Internal Medicine

## 2021-01-14 ENCOUNTER — Encounter: Payer: Self-pay | Admitting: Internal Medicine

## 2021-01-14 VITALS — BP 124/68 | HR 77 | Ht 64.5 in | Wt 175.0 lb

## 2021-01-14 DIAGNOSIS — I442 Atrioventricular block, complete: Secondary | ICD-10-CM

## 2021-01-14 DIAGNOSIS — Z950 Presence of cardiac pacemaker: Secondary | ICD-10-CM | POA: Insufficient documentation

## 2021-01-14 LAB — CUP PACEART INCLINIC DEVICE CHECK
Battery Remaining Longevity: 106 mo
Battery Voltage: 3.01 V
Brady Statistic RA Percent Paced: 17 %
Brady Statistic RV Percent Paced: 99.75 %
Date Time Interrogation Session: 20221213172235
Implantable Lead Implant Date: 20220908
Implantable Lead Implant Date: 20220908
Implantable Lead Location: 753859
Implantable Lead Location: 753860
Implantable Pulse Generator Implant Date: 20220908
Lead Channel Impedance Value: 512.5 Ohm
Lead Channel Impedance Value: 625 Ohm
Lead Channel Pacing Threshold Amplitude: 0.5 V
Lead Channel Pacing Threshold Amplitude: 0.5 V
Lead Channel Pacing Threshold Amplitude: 0.75 V
Lead Channel Pacing Threshold Amplitude: 0.75 V
Lead Channel Pacing Threshold Pulse Width: 0.5 ms
Lead Channel Pacing Threshold Pulse Width: 0.5 ms
Lead Channel Pacing Threshold Pulse Width: 0.5 ms
Lead Channel Pacing Threshold Pulse Width: 0.5 ms
Lead Channel Sensing Intrinsic Amplitude: 1 mV
Lead Channel Sensing Intrinsic Amplitude: 6.5 mV
Lead Channel Setting Pacing Amplitude: 2 V
Lead Channel Setting Pacing Amplitude: 2.5 V
Lead Channel Setting Pacing Pulse Width: 0.5 ms
Lead Channel Setting Sensing Sensitivity: 2 mV
Pulse Gen Model: 2272
Pulse Gen Serial Number: 6519562

## 2021-01-14 NOTE — Progress Notes (Signed)
HPI Penny Huynh returns today for followup. She is a pleasant 87 yo woman with CHB, s/p PPM insertion, HTN, and diastolic heart failure. She is s/p PPM insertion about 3 months ago. She denies chest pain, sob, or syncope. No edema. She feels well otherwise.  No Known Allergies   Current Outpatient Medications  Medication Sig Dispense Refill   albuterol (VENTOLIN HFA) 108 (90 Base) MCG/ACT inhaler Inhale 1 puff into the lungs every 6 (six) hours as needed for wheezing or shortness of breath.     alendronate (FOSAMAX) 70 MG tablet Take 70 mg by mouth once a week.     amLODipine (NORVASC) 5 MG tablet Take 1 tablet (5 mg total) by mouth daily. 90 tablet 3   anastrozole (ARIMIDEX) 1 MG tablet TAKE 1 TABLET BY MOUTH EVERY DAY 90 tablet 3   aspirin 81 MG EC tablet Take 81 mg by mouth at bedtime.     chlorthalidone (HYGROTON) 25 MG tablet Take 25 mg by mouth daily.     Coenzyme Q10 (COQ10) 100 MG CAPS Take 100 mg by mouth daily.     colchicine 0.6 MG tablet Take 0.6 mg by mouth 4 (four) times daily as needed (gout flare).     cyclobenzaprine (FLEXERIL) 10 MG tablet Take 5-10 mg by mouth at bedtime as needed. As directed     latanoprost (XALATAN) 0.005 % ophthalmic solution Place 1 drop into both eyes at bedtime.     losartan (COZAAR) 100 MG tablet Take 100 mg by mouth daily.     metFORMIN (GLUCOPHAGE) 500 MG tablet Take 1,000 mg by mouth daily.     pregabalin (LYRICA) 150 MG capsule Take 1 capsule (150 mg total) by mouth 2 (two) times daily. 60 capsule 3   rosuvastatin (CRESTOR) 5 MG tablet Take 5 mg by mouth every other day.     vitamin B-12 (CYANOCOBALAMIN) 1000 MCG tablet Take 1,000 mcg by mouth daily.     Vitamin D, Cholecalciferol, 25 MCG (1000 UT) CAPS Take 25 mcg by mouth daily.     No current facility-administered medications for this visit.     Past Medical History:  Diagnosis Date   Arthritis    "maybe a little in my left knee" (02/23/2017)   Asthmatic bronchitis     Cancer of left breast ([place]) 2019   Coronary atherosclerosis    Minor nonobstructive CAD at cardiac catheterization 2009   Dyslipidemia    Femoral bruit 7/08   With normal ABIs   Hypertension    Iliac artery aneurysm [facility])    Lower extremity edema    Chronic   Pneumonia ~ 1950   in 8th grade   Type II diabetes mellitus ([place])     ROS:   All systems reviewed and negative except as noted in the HPI.   Past Surgical History:  Procedure Laterality Date   APPENDECTOMY     BREAST BIOPSY Left 02/2017   CARDIAC CATHETERIZATION     "long time ago" (02/23/2017)   MASTECTOMY COMPLETE / SIMPLE W/ SENTINEL NODE BIOPSY Left 02/23/2017   TOTAL MATECTOMY;  LEFT AXILLARY SENTINEL LYMPH NODE BIOPSY ERAS PATHWAY   MASTECTOMY WITH RADIOACTIVE SEED GUIDED EXCISION AND AXILLARY SENTINEL LYMPH NODE BIOPSY Left 02/23/2017   Procedure: LEFT TOTAL MATECTOMY;  LEFT AXILLARY SENTINEL LYMPH NODE BIOPSY ERAS PATHWAY;  Surgeon: Excell Seltzer, MD;  Location: [facility];  Service: General;  Laterality: Left;   PACEMAKER IMPLANT N/A 10/10/2020   Procedure: PACEMAKER IMPLANT;  Surgeon: Evans Lance, MD;  Location: [place] [facility];  Service: Cardiovascular;  Laterality: N/A;   PILONIDAL CYST EXCISION     PORT-A-CATH REMOVAL Right 09/27/2018   Procedure: REMOVAL PORT-A-CATH;  Surgeon: Fanny Skates, MD;  Location: [facility];  Service: General;  Laterality: Right;   PORTA CATH INSERTION  02/23/2017   PORTACATH PLACEMENT N/A 02/23/2017   Procedure: INSERTION PORT-A-CATH;  Surgeon: Excell Seltzer, MD;  Location: [facility];  Service: General;  Laterality: N/A;   SHOULDER ARTHROSCOPY W/ ROTATOR CUFF REPAIR Right      Family History  Problem Relation Age of Onset   Diabetes Mother    Stroke Father    Cancer Father        unknown type      Social History   Socioeconomic History   Marital status: Married    Spouse name: Not on file   Number of children: 2   Years of education: Not on file    Highest education level: Not on file  Occupational History   Occupation: tax preparer for [employer]    Comment: only works during tax season but not currently    Occupation: in Dentist    Occupation: retired Mining engineer of education     Comment: 1997  Tobacco Use   Smoking status: Former    Packs/day: 2.00    Years: 46.00    Pack years: 92.00    Types: Cigarettes    Start date: 11/09/1953    Quit date: 02/03/1991    Years since quitting: 29.9   Smokeless tobacco: Never  Vaping Use   Vaping Use: Never used  Substance and Sexual Activity   Alcohol use: Yes    Alcohol/week: 2.0 standard drinks    Types: 2 Glasses of wine per week    Comment: social   Drug use: No   Sexual activity: Not on file  Other Topics Concern   Not on file  Social History Narrative   Lives in [name] with her husband.   Has 2 grown children.   10-25-17 Unable to ask abuse questions husband with her today.   Social Determinants of Health   Financial Resource Strain: Not on file  Food Insecurity: Not on file  Transportation Needs: Not on file  Physical Activity: Not on file  Stress: Not on file  Social Connections: Not on file  Intimate Partner Violence: Not on file     BP 124/68    Pulse 77    Ht 5' 4.5" (1.638 m)    Wt 175 lb (79.4 kg)    SpO2 96%    BMI 29.57 kg/m   Physical Exam:  Well appearing NAD HEENT: Unremarkable Neck:  No JVD, no thyromegally Lymphatics:  No adenopathy Back:  No CVA tenderness Lungs:  Clear with no wheezes HEART:  Regular rate rhythm, no murmurs, no rubs, no clicks Abd:  soft, positive bowel sounds, no organomegally, no rebound, no guarding Ext:  2 plus pulses, no edema, no cyanosis, no clubbing Skin:  No rashes no nodules Neuro:  CN II through XII intact, motor grossly intact  EKG - nsr  DEVICE  Normal device function.  See PaceArt for details.   Assess/Plan:  CHB - she is s/pPPM insertion and is doing well.  HTN - her bp is well controlled. No  change. Diastolic heart failure - her symptoms are class 2. She is encouraged to avoid salty foods.  Breast CA - she is s/p chemo but  appears to be doing well. Her port o cath is out.   Carleene Overlie Shanik [place],MD

## 2021-01-14 NOTE — Patient Instructions (Signed)
Medication Instructions:  Your physician recommends that you continue on your current medications as directed. Please refer to the Current Medication list given to you today.  Labwork: None ordered.  Testing/Procedures: None ordered.  Follow-Up: Your physician wants you to follow-up in: one year with Cristopher Peru, MD or one of the following Advanced Practice Providers on your designated Care Team:   Tommye Standard, [place] Legrand Como "Jonni Sanger" Chalmers Cater, [place]  Remote monitoring is used to monitor your Pacemaker from home. This monitoring reduces the number of office visits required to check your device to one time per year. It allows [place] to keep an eye on the functioning of your device to ensure it is working properly. You are scheduled for a device check from home on 01/23/2021. You may send your transmission at any time that day. If you have a wireless device, the transmission will be sent automatically. After your physician reviews your transmission, you will receive a postcard with your next transmission date.  Any Other Special Instructions Will Be Listed Below (If Applicable).  If you need a refill on your cardiac medications before your next appointment, please call your pharmacy.

## 2021-01-23 ENCOUNTER — Ambulatory Visit (INDEPENDENT_AMBULATORY_CARE_PROVIDER_SITE_OTHER): Payer: Medicare Other

## 2021-01-23 DIAGNOSIS — I442 Atrioventricular block, complete: Secondary | ICD-10-CM

## 2021-01-23 LAB — CUP PACEART REMOTE DEVICE CHECK
Battery Remaining Longevity: 104 mo
Battery Remaining Percentage: 95.5 %
Battery Voltage: 3.02 V
Brady Statistic AP VP Percent: 23 %
Brady Statistic AP VS Percent: 1 %
Brady Statistic AS VP Percent: 77 %
Brady Statistic AS VS Percent: 1 %
Brady Statistic RA Percent Paced: 22 %
Brady Statistic RV Percent Paced: 99 %
Date Time Interrogation Session: 20221222033654
Implantable Lead Implant Date: 20220908
Implantable Lead Implant Date: 20220908
Implantable Lead Location: 753859
Implantable Lead Location: 753860
Implantable Pulse Generator Implant Date: 20220908
Lead Channel Impedance Value: 490 Ohm
Lead Channel Impedance Value: 590 Ohm
Lead Channel Pacing Threshold Amplitude: 0.5 V
Lead Channel Pacing Threshold Amplitude: 0.75 V
Lead Channel Pacing Threshold Pulse Width: 0.5 ms
Lead Channel Pacing Threshold Pulse Width: 0.5 ms
Lead Channel Sensing Intrinsic Amplitude: 1.7 mV
Lead Channel Sensing Intrinsic Amplitude: 6.5 mV
Lead Channel Setting Pacing Amplitude: 2 V
Lead Channel Setting Pacing Amplitude: 2.5 V
Lead Channel Setting Pacing Pulse Width: 0.5 ms
Lead Channel Setting Sensing Sensitivity: 2 mV
Pulse Gen Model: 2272
Pulse Gen Serial Number: 6519562

## 2021-01-31 NOTE — Progress Notes (Signed)
Remote pacemaker transmission.   

## 2021-03-10 ENCOUNTER — Other Ambulatory Visit: Payer: Self-pay

## 2021-03-10 ENCOUNTER — Ambulatory Visit
Admission: RE | Admit: 2021-03-10 | Discharge: 2021-03-10 | Disposition: A | Discharge status: Home / Self Care | Payer: Medicare Other | Source: Ambulatory Visit | Attending: Hematology | Admitting: Hematology

## 2021-03-10 DIAGNOSIS — Z1231 Encounter for screening mammogram for malignant neoplasm of breast: Secondary | ICD-10-CM

## 2021-03-17 DIAGNOSIS — E7800 Pure hypercholesterolemia, unspecified: Secondary | ICD-10-CM | POA: Diagnosis not present

## 2021-03-17 DIAGNOSIS — E7801 Familial hypercholesterolemia: Secondary | ICD-10-CM | POA: Diagnosis not present

## 2021-03-17 DIAGNOSIS — E1140 Type 2 diabetes mellitus with diabetic neuropathy, unspecified: Secondary | ICD-10-CM | POA: Diagnosis not present

## 2021-03-17 DIAGNOSIS — E7849 Other hyperlipidemia: Secondary | ICD-10-CM | POA: Diagnosis not present

## 2021-03-17 DIAGNOSIS — E871 Hypo-osmolality and hyponatremia: Secondary | ICD-10-CM | POA: Diagnosis not present

## 2021-03-17 DIAGNOSIS — E1165 Type 2 diabetes mellitus with hyperglycemia: Secondary | ICD-10-CM | POA: Diagnosis not present

## 2021-03-17 DIAGNOSIS — E782 Mixed hyperlipidemia: Secondary | ICD-10-CM | POA: Diagnosis not present

## 2021-03-17 DIAGNOSIS — E876 Hypokalemia: Secondary | ICD-10-CM | POA: Diagnosis not present

## 2021-03-17 DIAGNOSIS — I10 Essential (primary) hypertension: Secondary | ICD-10-CM | POA: Diagnosis not present

## 2021-03-25 DIAGNOSIS — C50919 Malignant neoplasm of unspecified site of unspecified female breast: Secondary | ICD-10-CM | POA: Diagnosis not present

## 2021-03-25 DIAGNOSIS — E1165 Type 2 diabetes mellitus with hyperglycemia: Secondary | ICD-10-CM | POA: Diagnosis not present

## 2021-03-25 DIAGNOSIS — I10 Essential (primary) hypertension: Secondary | ICD-10-CM | POA: Diagnosis not present

## 2021-03-25 DIAGNOSIS — E871 Hypo-osmolality and hyponatremia: Secondary | ICD-10-CM | POA: Diagnosis not present

## 2021-03-25 DIAGNOSIS — E1140 Type 2 diabetes mellitus with diabetic neuropathy, unspecified: Secondary | ICD-10-CM | POA: Diagnosis not present

## 2021-03-25 DIAGNOSIS — R252 Cramp and spasm: Secondary | ICD-10-CM | POA: Diagnosis not present

## 2021-03-25 DIAGNOSIS — K5900 Constipation, unspecified: Secondary | ICD-10-CM | POA: Diagnosis not present

## 2021-03-25 DIAGNOSIS — E7849 Other hyperlipidemia: Secondary | ICD-10-CM | POA: Diagnosis not present

## 2021-04-07 ENCOUNTER — Ambulatory Visit: Payer: Medicare Other | Admitting: Hematology

## 2021-04-07 ENCOUNTER — Encounter: Payer: Self-pay | Admitting: Hematology

## 2021-04-07 ENCOUNTER — Inpatient Hospital Stay: Payer: Medicare Other | Attending: Hematology

## 2021-04-07 ENCOUNTER — Inpatient Hospital Stay: Payer: Medicare Other

## 2021-04-07 ENCOUNTER — Other Ambulatory Visit: Payer: Medicare Other

## 2021-04-07 ENCOUNTER — Inpatient Hospital Stay (HOSPITAL_BASED_OUTPATIENT_CLINIC_OR_DEPARTMENT_OTHER): Payer: Medicare Other | Admitting: Hematology

## 2021-04-07 ENCOUNTER — Other Ambulatory Visit: Payer: Self-pay

## 2021-04-07 ENCOUNTER — Inpatient Hospital Stay: Payer: Medicare Other | Admitting: Hematology

## 2021-04-07 VITALS — BP 140/70 | HR 76 | Temp 98.2°F | Resp 18 | Ht 64.5 in | Wt 169.4 lb

## 2021-04-07 DIAGNOSIS — C50212 Malignant neoplasm of upper-inner quadrant of left female breast: Secondary | ICD-10-CM

## 2021-04-07 DIAGNOSIS — M8580 Other specified disorders of bone density and structure, unspecified site: Secondary | ICD-10-CM | POA: Insufficient documentation

## 2021-04-07 DIAGNOSIS — Z79811 Long term (current) use of aromatase inhibitors: Secondary | ICD-10-CM | POA: Insufficient documentation

## 2021-04-07 DIAGNOSIS — C50412 Malignant neoplasm of upper-outer quadrant of left female breast: Secondary | ICD-10-CM | POA: Insufficient documentation

## 2021-04-07 DIAGNOSIS — E1042 Type 1 diabetes mellitus with diabetic polyneuropathy: Secondary | ICD-10-CM | POA: Diagnosis not present

## 2021-04-07 DIAGNOSIS — Z170 Estrogen receptor positive status [ER+]: Secondary | ICD-10-CM | POA: Insufficient documentation

## 2021-04-07 DIAGNOSIS — I2510 Atherosclerotic heart disease of native coronary artery without angina pectoris: Secondary | ICD-10-CM | POA: Insufficient documentation

## 2021-04-07 DIAGNOSIS — I10 Essential (primary) hypertension: Secondary | ICD-10-CM | POA: Insufficient documentation

## 2021-04-07 LAB — CMP (CANCER CENTER ONLY)
ALT: 13 U/L (ref 0–44)
AST: 19 U/L (ref 15–41)
Albumin: 4.2 g/dL (ref 3.5–5.0)
Alkaline Phosphatase: 47 U/L (ref 38–126)
Anion gap: 8 (ref 5–15)
BUN: 12 mg/dL (ref 8–23)
CO2: 29 mmol/L (ref 22–32)
Calcium: 10.2 mg/dL (ref 8.9–10.3)
Chloride: 99 mmol/L (ref 98–111)
Creatinine: 0.84 mg/dL (ref 0.44–1.00)
GFR, Estimated: >60 mL/min (ref >60)
Glucose, Bld: 113 mg/dL — ABNORMAL HIGH (ref 70–99)
Potassium: 3.3 mmol/L — ABNORMAL LOW (ref 3.5–5.1)
Sodium: 136 mmol/L (ref 135–145)
Total Bilirubin: 1 mg/dL (ref 0.3–1.2)
Total Protein: 7.4 g/dL (ref 6.5–8.1)

## 2021-04-07 LAB — CBC WITH DIFFERENTIAL (CANCER CENTER ONLY)
Abs Immature Granulocytes: 0.01 10*3/uL (ref 0.00–0.07)
Basophils Absolute: 0 10*3/uL (ref 0.0–0.1)
Basophils Relative: 1 %
Eosinophils Absolute: 0.1 10*3/uL (ref 0.0–0.5)
Eosinophils Relative: 2 %
HCT: 38.5 % (ref 36.0–46.0)
Hemoglobin: 12.5 g/dL (ref 12.0–15.0)
Immature Granulocytes: 0 %
Lymphocytes Relative: 46 %
Lymphs Abs: 1.6 10*3/uL (ref 0.7–4.0)
MCH: 29.3 pg (ref 26.0–34.0)
MCHC: 32.5 g/dL (ref 30.0–36.0)
MCV: 90.2 fL (ref 80.0–100.0)
Monocytes Absolute: 0.4 10*3/uL (ref 0.1–1.0)
Monocytes Relative: 10 %
Neutro Abs: 1.5 10*3/uL — ABNORMAL LOW (ref 1.7–7.7)
Neutrophils Relative %: 41 %
Platelet Count: 224 10*3/uL (ref 150–400)
RBC: 4.27 MIL/uL (ref 3.87–5.11)
RDW: 12.8 % (ref 11.5–15.5)
WBC Count: 3.6 10*3/uL — ABNORMAL LOW (ref 4.0–10.5)
nRBC: 0 % (ref 0.0–0.2)

## 2021-04-07 NOTE — Progress Notes (Signed)
[place]   [phone] [phone]   Clinic Follow up Note   Patient Care Team: Manon Hilding, MD as PCP - General (Cardiology) [name], MD (Inactive) as PCP - Cardiology (Cardiology) Truitt Merle, MD as Consulting Physician (Hematology) Excell Seltzer, MD (Inactive) as Consulting Physician (General Surgery) Alla Feeling, NP as Nurse Practitioner (Nurse Practitioner) [name], MD (Inactive) as Attending Physician (Cardiology)  Date of Service:  04/07/2021  CHIEF COMPLAINT: f/u of left breast cancer  CURRENT THERAPY:  Anastrozole 34m daily starting 10/2017  ASSESSMENT & PLAN:  MJAMEELA MICHNAis a 88y.o. female with   1. Multifocal invasive ductal carcinoma of the left breast, stage IB, mpT3, pN0: UOQ ER+/PR+/HER-2-, UIQ triple positive -Diagnosed in 01/2017. S/p left mastectomy 02/23/17, treated with adjuvant chemo with Herceptin and adjuvant radiation.  -She started antiestrogen therapy with anastrozole in 10/2017. Tolerating well overall. Will continue for 5 years -most recent right mammogram on 03/10/21 was negative. -She is clinically doing well from a breast cancer standpoint. Labs reviewed, no concern. Her physical exam was unremarkable. -Continue surveillance. Next Mammogram will be in 03/2022 -Continue Anastrozole, she is tolerating well  -F/u in 6 months, then annually   2. Osteopenia -07/2018 DEXA shows osteoporosis with lowest T-score -2.6 at left forearm radius. She was placed on Fosamax by her PCP -repeat DEXA on 10/16/20 showed improvement to -1.9, which is osteopenia. -Continue Calcium and Vit d.    3.  Bradycardia and third-degree AV block -found on EKG in our office on 10/09/20. She was subsequently admitted and had a pacemaker placed the following day. -she last saw cardiology on 01/14/21.   4. Peripheral neuropathy, G2, secondary to taxol, DM and T-DM1 -currently on lyrica -f/u with Dr. VMickeal Skinneras  needed   5. HTN, CAD, DM -f/u with PCP   6. Memory loss  -She has developed mild expressive aphasia and memory loss in early 2021 -She no longer has home aid and only needs help getting out of tub otherwise she is able to take care of herself at home.      Plan Continue anastrozole  -Lab and f/u with NP Lacie in 6 months   No problem-specific Assessment & Plan notes found for this encounter.   SUMMARY OF ONCOLOGIC HISTORY: Oncology History Overview Note  Cancer Staging Carcinoma of upper-inner quadrant of left breast in female, estrogen receptor positive ([place] Staging form: Breast, AJCC 8th Edition - Clinical stage from 01/18/2017: Stage IB (cT2, cN0, cM0, G3, ER: Positive, PR: Positive, HER2: Positive) - Signed by FTruitt Merle MD on 02/10/2017 - Pathologic stage from 02/23/2017: Stage IB (pT3, pN0, cM0, G3, ER: Positive, PR: Positive, HER2: Positive) - Signed by BAlla Feeling NP on 03/16/2017  Carcinoma of upper-outer quadrant of left breast in female, estrogen receptor positive ([facility] Staging form: Breast, AJCC 8th Edition - Clinical stage from 01/18/2017: Stage IB (cT2, cN0, cM0, G2, ER: Positive, PR: Positive, HER2: Negative) - Unsigned - Pathologic stage from 02/23/2017: Stage IB (pT2, pN0(sn), cM0, G3, ER: Positive, PR: Positive, HER2: Negative) - Signed by BAlla Feeling NP on 03/16/2017     Carcinoma of upper-outer quadrant of left breast in female, estrogen receptor positive ([facility]  01/13/2017 Mammogram   FINDINGS: In the left breast, a spiculated mass lies in the upper outer quadrant. In the medial posterior left breast, there is a larger lobulated mass. These masses correspond to the palpable abnormalities. No other discrete left breast  masses.   In the right breast, there are no discrete masses. There are no areas of architectural distortion.   In both breasts there are scattered calcifications, rounded punctate, with a few other larger coarse dystrophic  calcifications, without significant change from the previous screening mammogram, which is dated 12/04/2009.   Mammographic images were processed with CAD.   On physical exam, there is a firm palpable mass in the upper outer left breast and another firm mass in the medial left breast.     01/13/2017 Breast US   Targeted ultrasound is performed, showing an irregular hypoechoic shadowing mass in the left breast at the 2:30 o'clock position, 5 cm the nipple, measuring 3.3 x 2.8 x 2.6 cm. In the 10:30 o'clock position of the left breast, 5 cm the nipple, there is irregular hypoechoic mass with somewhat more lobulated margins, corresponding to the lobulated mass seen mammographically. This measures 4.8 x 2.9 x 4.8 cm. Both masses show internal vascularity on color Doppler analysis.   Sonographic evaluation of the left axilla shows several nodes with thickened cortices. Status cortex measured is 5 mm. None of these lymph nodes appear enlarged but overall size criteria.   IMPRESSION: 1. Two masses in the left breast, 1 at the 2:30 o'clock position in the other at the 10:30 o'clock position, both highly suspicious for breast carcinoma. 2. Several abnormal left axillary lymph nodes with thickened cortices. 3. No evidence of right breast malignancy.     01/18/2017 Pathology Results   Diagnosis 1. Breast, left, needle core biopsy, 2:30 o'clock - INVASIVE MAMMARY CARCINOMA - MAMMARY CARCINOMA IN-SITU - SEE COMMENT 2. Lymph node, needle/core biopsy, left axilla - NO CARCINOMA IDENTIFIED - SEE COMMENT 3. Breast, left, needle core biopsy, 10:30 o'clock - INVASIVE DUCTAL CARCINOMA - SEE COMMENT  1. PROGNOSTIC INDICATORS Results: IMMUNOHISTOCHEMICAL AND MORPHOMETRIC ANALYSIS PERFORMED MANUALLY Estrogen Receptor: 100%, POSITIVE, STRONG STAINING INTENSITY Progesterone Receptor: 60%, POSITIVE, STRONG STAINING INTENSITY Proliferation Marker Ki67: 60%  1. FLUORESCENCE IN-SITU  HYBRIDIZATION Results: HER2 - NEGATIVE RATIO OF HER2/CEP17 SIGNALS 1.28 AVERAGE HER2 COPY NUMBER PER CELL 2.75  3. PROGNOSTIC INDICATORS Results: IMMUNOHISTOCHEMICAL AND MORPHOMETRIC ANALYSIS PERFORMED MANUALLY Estrogen Receptor: 100%, POSITIVE, STRONG STAINING INTENSITY Progesterone Receptor: 50%, POSITIVE, STRONG STAINING INTENSITY Proliferation Marker Ki67: 70%  3. FLUORESCENCE IN-SITU HYBRIDIZATION Results: HER2 - **POSITIVE** RATIO OF HER2/CEP17 SIGNALS 2.80 AVERAGE HER2 COPY NUMBER PER CELL 6.15  Microscopic Comment 1. The biopsy material shows an infiltrative proliferation of cells with large vesicular nuclei with inconspicuous nucleoli, arranged linearly and in small clusters. Based on the biopsy, the carcinoma appears Nottingham grade 2 of 3 and measures 0.9 cm in greatest linear extent. E-cadherin and prognostic markers (ER/PR/ki-67/HER2-FISH)are pending and will be reported in an addendum. Dr. Lyndon Code reviewed the case and agrees with the above diagnosis.   02/10/2017 Initial Diagnosis   Carcinoma of upper-outer quadrant of left breast in female, estrogen receptor positive ([place])   02/19/2017 Imaging   Bone Scan whole Body 02/19/17 IMPRESSION: Negative for evidence of bony metastatic disease.   02/19/2017 Imaging   CT CAP W Contrast 02/19/17 IMPRESSION: 1. Small right middle lobe pulmonary nodules up to 0.8 cm, nonspecific. Non-contrast chest CT at 3-6 months is recommended. If the nodules are stable at time of repeat CT, then future CT at 18-24 months (from today's scan) is considered optional for low-risk patients, but is recommended for high-risk patients. This recommendation follows the consensus statement: Guidelines for Management of Incidental Pulmonary Nodules Detected on CT Images: From the Fleischner  Society 2017; Radiology 2017; 284:228-243. 2. Subcentimeter hepatic lesions likely cysts but incompletely characterized due to small size. Attention on follow-up  imaging recommended. 3. 2.1 cm fusiform right common iliac artery aneurysm. Continued surveillance recommended. 4. Aortic Atherosclerosis (ICD10-I70.0) and Emphysema (ICD10-J43.9).   02/23/2017 Pathology Results   Diagnosis 1. Breast, simple mastectomy, Left Total - INVASIVE DUCTAL CARCINOMA, MULTIFOCAL, NOTTINGHAM GRADE 3/3 (5.3 CM, 3.5 CM) - INVASIVE CARCINOMA INVOLVES THE DERMIS - DUCTAL CARCINOMA IN SITU, INTERMEDIATE GRADE - HYALINIZED FIBROADENOMA - MARGINS UNINVOLVED BY CARCINOMA - NO CARCINOMA IDENTIFIED IN TWO LYMPH NODES (0/2) - SEE ONCOLOGY TABLE AND COMMENT BELOW 2. Lymph node, sentinel, biopsy, Left Axillary - NO CARCINOMA IDENTIFIED IN ONE LYMPH NODE (0/1) 3. Lymph node, sentinel, biopsy - NO CARCINOMA IDENTIFIED IN ONE LYMPH NODE (0/1) 4. Lymph node, sentinel, biopsy - NO CARCINOMA IDENTIFIED IN ONE LYMPH NODE (0/1) 5. Lymph node, sentinel, biopsy - NO CARCINOMA IDENTIFIED IN ONE LYMPH NODE (0/1) Microscopic Comment 1. BREAST, INVASIVE TUMOR Procedure: Simple mastectomy Laterality: Left Tumor Size: 5. 3 cm, 3.5 cm Histologic Type: Invasive carcinoma of no special type (ductal, not otherwise specified) Grade: Nottingham Grade 3 Tubular Differentiation: 3 Nuclear Pleomorphism: 3 Mitotic Count: 2 Ductal Carcinoma in Situ (DCIS): Present Extent of Tumor: Skin: Invasive carcinoma directly invades into the dermis or epidermis without skin ulceration Margins: Invasive carcinoma, distance from closest margin: 1.5 cm (posterior) DCIS, distance from closest margin: > 1 cm Regional Lymph Nodes: Number of Lymph Nodes Examined: 6 Number of Sentinel Lymph Nodes Examined: 4 Lymph Nodes with Macrometastases: 0 Lymph Nodes with Micrometastases: 0 Lymph Nodes with Isolated Tumor Cells: 0 Treatment effect: No known presurgical therapy Breast Prognostic Profile: See Also ([id number]) Estrogen Receptor: Positive (100%, strong); Positive (100%, strong) Progesterone  Receptor: Positive (50%, strong); Positive (50%, strong) Her2: Positive (Ratio 2.80); Negative (Ratio 1.28) Ki-67: 70%; 60% Best tumor block for sendout testing: 1E Pathologic Stage Classification (pTNM, AJCC 8th Edition): Primary Tumor: mpT3 Regional Lymph Nodes: pN0 COMMENT: The two invasive carcinomas in the breast have slightly different morphologies. The larger lesion has a papillary and micropapillary pattern admixed with typical ductal carcinoma while the smaller lesion is more typical of a ductal lesion with linear arrays (E-cadherin performed on biopsy). There are foci within the larger lesion that are concerning for lymphovascular space invasion.    04/07/2017 - 04/27/2018 Chemotherapy   weekly taxol and herceptin x12 weeks, 04/07/17-06/23/17. Then herceptin q3 weeks for total 1 year which was switched to Pepin on 07/14/17. Stopped after 2 doses due to worsening neuropathy. Switched back to maintenance Herceptin started 8/12019. Completed on 04/27/18.    07/20/2017 - 09/03/2017 Radiation Therapy   She started adjuvant radiation on 07/20/17 with Dr. Lisbeth Renshaw and plans to complete on 09/03/17.    10/2017 -  Anti-estrogen oral therapy   Anastrozole 75m daily starting 10/2017   09/27/2018 Procedure   She had her PAC removed on 09/27/18.       INTERVAL HISTORY:  MCONTINA STRAINis here for a follow up of breast cancer. She was last seen by me on 10/09/20. She presents to the clinic accompanied by her husband. She reports she is doing well overall, no new concerns.   All other systems were reviewed with the patient and are negative.  MEDICAL HISTORY:  Past Medical History:  Diagnosis Date   Arthritis    "maybe a little in my left knee" (02/23/2017)   Asthmatic bronchitis    Cancer of left breast [place] [place]  Coronary atherosclerosis    Minor nonobstructive CAD at cardiac catheterization 2009   Dyslipidemia    Femoral bruit 7/08   With normal ABIs   Hypertension    Iliac artery  aneurysm ([facility])    Lower extremity edema    Chronic   Pneumonia ~ 1950   in 8th grade   Type II diabetes mellitus ([place])     SURGICAL HISTORY: Past Surgical History:  Procedure Laterality Date   APPENDECTOMY     BREAST BIOPSY Left 02/2017   CARDIAC CATHETERIZATION     "long time ago" (02/23/2017)   MASTECTOMY COMPLETE / SIMPLE W/ SENTINEL NODE BIOPSY Left 02/23/2017   TOTAL MATECTOMY;  LEFT AXILLARY SENTINEL LYMPH NODE BIOPSY ERAS PATHWAY   MASTECTOMY WITH RADIOACTIVE SEED GUIDED EXCISION AND AXILLARY SENTINEL LYMPH NODE BIOPSY Left 02/23/2017   Procedure: LEFT TOTAL MATECTOMY;  LEFT AXILLARY SENTINEL LYMPH NODE BIOPSY ERAS PATHWAY;  Surgeon: Excell Seltzer, MD;  Location: [facility];  Service: General;  Laterality: Left;   PACEMAKER IMPLANT N/A 10/10/2020   Procedure: PACEMAKER IMPLANT;  Surgeon: Evans Lance, MD;  Location: [facility];  Service: Cardiovascular;  Laterality: N/A;   PILONIDAL CYST EXCISION     PORT-A-CATH REMOVAL Right 09/27/2018   Procedure: REMOVAL PORT-A-CATH;  Surgeon: Fanny Skates, MD;  Location: [facility];  Service: General;  Laterality: Right;   PORTA CATH INSERTION  02/23/2017   PORTACATH PLACEMENT N/A 02/23/2017   Procedure: INSERTION PORT-A-CATH;  Surgeon: Excell Seltzer, MD;  Location: [facility];  Service: General;  Laterality: N/A;   SHOULDER ARTHROSCOPY W/ ROTATOR CUFF REPAIR Right     I have reviewed the social history and family history with the patient and they are unchanged from previous note.  ALLERGIES:  has No Known Allergies.  MEDICATIONS:  Current Outpatient Medications  Medication Sig Dispense Refill   albuterol (VENTOLIN HFA) 108 (90 Base) MCG/ACT inhaler Inhale 1 puff into the lungs every 6 (six) hours as needed for wheezing or shortness of breath.     alendronate (FOSAMAX) 70 MG tablet Take 70 mg by mouth once a week.     amLODipine (NORVASC) 5 MG tablet Take 1 tablet (5 mg total) by mouth daily. 90 tablet 3    anastrozole (ARIMIDEX) 1 MG tablet TAKE 1 TABLET BY MOUTH EVERY DAY 90 tablet 3   aspirin 81 MG EC tablet Take 81 mg by mouth at bedtime.     chlorthalidone (HYGROTON) 25 MG tablet Take 25 mg by mouth daily.     Coenzyme Q10 (COQ10) 100 MG CAPS Take 100 mg by mouth daily.     cyclobenzaprine (FLEXERIL) 10 MG tablet Take 5-10 mg by mouth at bedtime as needed. As directed     latanoprost (XALATAN) 0.005 % ophthalmic solution Place 1 drop into both eyes at bedtime.     losartan (COZAAR) 100 MG tablet Take 100 mg by mouth daily.     metFORMIN (GLUCOPHAGE) 500 MG tablet Take 1,000 mg by mouth daily.     pregabalin (LYRICA) 150 MG capsule Take 1 capsule (150 mg total) by mouth 2 (two) times daily. 60 capsule 3   rosuvastatin (CRESTOR) 5 MG tablet Take 5 mg by mouth every other day.     vitamin B-12 (CYANOCOBALAMIN) 1000 MCG tablet Take 1,000 mcg by mouth daily.     Vitamin D, Cholecalciferol, 25 MCG (1000 UT) CAPS Take 25 mcg by mouth daily.     No current facility-administered medications for this visit.  PHYSICAL EXAMINATION: ECOG PERFORMANCE STATUS: 2 - Symptomatic, <50% confined to bed  Vitals:   04/07/21 1436  BP: 140/70  Pulse: 76  Resp: 18  Temp: 98.2 F (36.8 C)  SpO2: 100%   Wt Readings from Last 3 Encounters:  04/07/21 169 lb 6.4 oz (76.8 kg)  01/14/21 175 lb (79.4 kg)  10/17/20 170 lb (77.1 kg)     GENERAL:alert, no distress and comfortable SKIN: skin color, texture, turgor are normal, no rashes or significant lesions EYES: normal, Conjunctiva are pink and non-injected, sclera clear  NECK: supple, thyroid normal size, non-tender, without nodularity LYMPH:  no palpable lymphadenopathy in the cervical, axillary  ABDOMEN:abdomen soft, non-tender and normal bowel sounds Musculoskeletal:no cyanosis of digits and no clubbing  NEURO: alert & oriented x 3 with fluent speech, no focal motor/sensory deficits BREAST: No palpable mass, nodules or adenopathy bilaterally. Breast  exam benign.   LABORATORY DATA:  I have reviewed the data as listed CBC Latest Ref Rng & Units 04/07/2021 10/10/2020 10/09/2020  WBC 4.0 - 10.5 K/uL 3.6(L) 4.3 4.4  Hemoglobin 12.0 - 15.0 g/dL 12.5 11.8(L) 12.4  Hematocrit 36.0 - 46.0 % 38.5 36.0 38.6  Platelets 150 - 400 K/uL 224 183 186     CMP Latest Ref Rng & Units 04/07/2021 10/10/2020 10/09/2020  Glucose 70 - 99 mg/dL 113(H) 123(H) -  BUN 8 - 23 mg/dL 12 13 -  Creatinine 0.44 - 1.00 mg/dL 0.84 0.66 0.74  Sodium 135 - 145 mmol/L 136 137 -  Potassium 3.5 - 5.1 mmol/L 3.3(L) 3.6 -  Chloride 98 - 111 mmol/L 99 103 -  CO2 22 - 32 mmol/L 29 26 -  Calcium 8.9 - 10.3 mg/dL 10.2 9.1 -  Total Protein 6.5 - 8.1 g/dL 7.4 - -  Total Bilirubin 0.3 - 1.2 mg/dL 1.0 - -  Alkaline Phos 38 - 126 U/L 47 - -  AST 15 - 41 U/L 19 - -  ALT 0 - 44 U/L 13 - -      RADIOGRAPHIC STUDIES: I have personally reviewed the radiological images as listed and agreed with the findings in the report. No results found.    No orders of the defined types were placed in this encounter.  All questions were answered. The patient knows to call the clinic with any problems, questions or concerns. No barriers to learning was detected. The total time spent in the appointment was 25 minutes.     Truitt Merle, MD 04/07/2021   I, Wilburn Mylar, am acting as scribe for Truitt Merle, MD.   I have reviewed the above documentation for accuracy and completeness, and I agree with the above.

## 2021-04-22 DIAGNOSIS — Z20822 Contact with and (suspected) exposure to covid-19: Secondary | ICD-10-CM | POA: Diagnosis not present

## 2021-04-24 ENCOUNTER — Ambulatory Visit (INDEPENDENT_AMBULATORY_CARE_PROVIDER_SITE_OTHER): Payer: Medicare Other

## 2021-04-24 DIAGNOSIS — I442 Atrioventricular block, complete: Secondary | ICD-10-CM | POA: Diagnosis not present

## 2021-04-25 LAB — CUP PACEART REMOTE DEVICE CHECK
Battery Remaining Longevity: 103 mo
Battery Remaining Percentage: 95.5 %
Battery Voltage: 3.02 V
Brady Statistic AP VP Percent: 15 %
Brady Statistic AP VS Percent: 1 %
Brady Statistic AS VP Percent: 85 %
Brady Statistic AS VS Percent: 1 %
Brady Statistic RA Percent Paced: 15 %
Brady Statistic RV Percent Paced: 99 %
Date Time Interrogation Session: 20230323020015
Implantable Lead Implant Date: 20220908
Implantable Lead Implant Date: 20220908
Implantable Lead Location: 753859
Implantable Lead Location: 753860
Implantable Pulse Generator Implant Date: 20220908
Lead Channel Impedance Value: 480 Ohm
Lead Channel Impedance Value: 630 Ohm
Lead Channel Pacing Threshold Amplitude: 0.5 V
Lead Channel Pacing Threshold Amplitude: 0.75 V
Lead Channel Pacing Threshold Pulse Width: 0.5 ms
Lead Channel Pacing Threshold Pulse Width: 0.5 ms
Lead Channel Sensing Intrinsic Amplitude: 1.6 mV
Lead Channel Sensing Intrinsic Amplitude: 6.5 mV
Lead Channel Setting Pacing Amplitude: 2 V
Lead Channel Setting Pacing Amplitude: 2.5 V
Lead Channel Setting Pacing Pulse Width: 0.5 ms
Lead Channel Setting Sensing Sensitivity: 2 mV
Pulse Gen Model: 2272
Pulse Gen Serial Number: 6519562

## 2021-04-28 DIAGNOSIS — Z20822 Contact with and (suspected) exposure to covid-19: Secondary | ICD-10-CM | POA: Diagnosis not present

## 2021-04-29 ENCOUNTER — Other Ambulatory Visit: Payer: Self-pay | Admitting: Internal Medicine

## 2021-05-01 NOTE — Progress Notes (Signed)
Remote pacemaker transmission.   

## 2021-05-02 DIAGNOSIS — Z20822 Contact with and (suspected) exposure to covid-19: Secondary | ICD-10-CM | POA: Diagnosis not present

## 2021-05-08 DIAGNOSIS — E119 Type 2 diabetes mellitus without complications: Secondary | ICD-10-CM | POA: Diagnosis not present

## 2021-05-08 DIAGNOSIS — Z961 Presence of intraocular lens: Secondary | ICD-10-CM | POA: Diagnosis not present

## 2021-05-08 DIAGNOSIS — H401131 Primary open-angle glaucoma, bilateral, mild stage: Secondary | ICD-10-CM | POA: Diagnosis not present

## 2021-05-08 DIAGNOSIS — H04123 Dry eye syndrome of bilateral lacrimal glands: Secondary | ICD-10-CM | POA: Diagnosis not present

## 2021-05-19 DIAGNOSIS — Z20822 Contact with and (suspected) exposure to covid-19: Secondary | ICD-10-CM | POA: Diagnosis not present

## 2021-06-04 DIAGNOSIS — Z20822 Contact with and (suspected) exposure to covid-19: Secondary | ICD-10-CM | POA: Diagnosis not present

## 2021-06-09 DIAGNOSIS — Z20822 Contact with and (suspected) exposure to covid-19: Secondary | ICD-10-CM | POA: Diagnosis not present

## 2021-06-24 DIAGNOSIS — E1140 Type 2 diabetes mellitus with diabetic neuropathy, unspecified: Secondary | ICD-10-CM | POA: Diagnosis not present

## 2021-06-24 DIAGNOSIS — M79674 Pain in right toe(s): Secondary | ICD-10-CM | POA: Diagnosis not present

## 2021-06-24 DIAGNOSIS — I739 Peripheral vascular disease, unspecified: Secondary | ICD-10-CM | POA: Diagnosis not present

## 2021-06-24 DIAGNOSIS — M79675 Pain in left toe(s): Secondary | ICD-10-CM | POA: Diagnosis not present

## 2021-06-24 DIAGNOSIS — Q828 Other specified congenital malformations of skin: Secondary | ICD-10-CM | POA: Diagnosis not present

## 2021-06-24 DIAGNOSIS — M79672 Pain in left foot: Secondary | ICD-10-CM | POA: Diagnosis not present

## 2021-06-24 DIAGNOSIS — M79671 Pain in right foot: Secondary | ICD-10-CM | POA: Diagnosis not present

## 2021-07-09 DIAGNOSIS — M79676 Pain in unspecified toe(s): Secondary | ICD-10-CM | POA: Diagnosis not present

## 2021-07-09 DIAGNOSIS — M79675 Pain in left toe(s): Secondary | ICD-10-CM | POA: Diagnosis not present

## 2021-07-09 DIAGNOSIS — I7389 Other specified peripheral vascular diseases: Secondary | ICD-10-CM | POA: Diagnosis not present

## 2021-07-17 DIAGNOSIS — E1140 Type 2 diabetes mellitus with diabetic neuropathy, unspecified: Secondary | ICD-10-CM | POA: Diagnosis not present

## 2021-07-17 DIAGNOSIS — L03031 Cellulitis of right toe: Secondary | ICD-10-CM | POA: Diagnosis not present

## 2021-07-17 DIAGNOSIS — M79672 Pain in left foot: Secondary | ICD-10-CM | POA: Diagnosis not present

## 2021-07-17 DIAGNOSIS — M79675 Pain in left toe(s): Secondary | ICD-10-CM | POA: Diagnosis not present

## 2021-07-21 DIAGNOSIS — E7849 Other hyperlipidemia: Secondary | ICD-10-CM | POA: Diagnosis not present

## 2021-07-21 DIAGNOSIS — E1165 Type 2 diabetes mellitus with hyperglycemia: Secondary | ICD-10-CM | POA: Diagnosis not present

## 2021-07-21 DIAGNOSIS — E876 Hypokalemia: Secondary | ICD-10-CM | POA: Diagnosis not present

## 2021-07-21 DIAGNOSIS — E782 Mixed hyperlipidemia: Secondary | ICD-10-CM | POA: Diagnosis not present

## 2021-07-21 DIAGNOSIS — I10 Essential (primary) hypertension: Secondary | ICD-10-CM | POA: Diagnosis not present

## 2021-07-24 ENCOUNTER — Ambulatory Visit (INDEPENDENT_AMBULATORY_CARE_PROVIDER_SITE_OTHER): Payer: Medicare Other

## 2021-07-24 DIAGNOSIS — I442 Atrioventricular block, complete: Secondary | ICD-10-CM

## 2021-07-24 LAB — CUP PACEART REMOTE DEVICE CHECK
Battery Remaining Longevity: 101 mo
Battery Remaining Percentage: 93 %
Battery Voltage: 3.02 V
Brady Statistic AP VP Percent: 19 %
Brady Statistic AP VS Percent: 1 %
Brady Statistic AS VP Percent: 81 %
Brady Statistic AS VS Percent: 1 %
Brady Statistic RA Percent Paced: 19 %
Brady Statistic RV Percent Paced: 99 %
Date Time Interrogation Session: 20230622020015
Implantable Lead Implant Date: 20220908
Implantable Lead Implant Date: 20220908
Implantable Lead Location: 753859
Implantable Lead Location: 753860
Implantable Pulse Generator Implant Date: 20220908
Lead Channel Impedance Value: 530 Ohm
Lead Channel Impedance Value: 610 Ohm
Lead Channel Pacing Threshold Amplitude: 0.5 V
Lead Channel Pacing Threshold Amplitude: 0.75 V
Lead Channel Pacing Threshold Pulse Width: 0.5 ms
Lead Channel Pacing Threshold Pulse Width: 0.5 ms
Lead Channel Sensing Intrinsic Amplitude: 3.2 mV
Lead Channel Sensing Intrinsic Amplitude: 6.5 mV
Lead Channel Setting Pacing Amplitude: 2 V
Lead Channel Setting Pacing Amplitude: 2.5 V
Lead Channel Setting Pacing Pulse Width: 0.5 ms
Lead Channel Setting Sensing Sensitivity: 2 mV
Pulse Gen Model: 2272
Pulse Gen Serial Number: 6519562

## 2021-07-28 DIAGNOSIS — E7801 Familial hypercholesterolemia: Secondary | ICD-10-CM | POA: Diagnosis not present

## 2021-07-28 DIAGNOSIS — E1140 Type 2 diabetes mellitus with diabetic neuropathy, unspecified: Secondary | ICD-10-CM | POA: Diagnosis not present

## 2021-07-28 DIAGNOSIS — C50919 Malignant neoplasm of unspecified site of unspecified female breast: Secondary | ICD-10-CM | POA: Diagnosis not present

## 2021-07-28 DIAGNOSIS — K5900 Constipation, unspecified: Secondary | ICD-10-CM | POA: Diagnosis not present

## 2021-07-28 DIAGNOSIS — M542 Cervicalgia: Secondary | ICD-10-CM | POA: Diagnosis not present

## 2021-07-28 DIAGNOSIS — E871 Hypo-osmolality and hyponatremia: Secondary | ICD-10-CM | POA: Diagnosis not present

## 2021-07-28 DIAGNOSIS — M109 Gout, unspecified: Secondary | ICD-10-CM | POA: Diagnosis not present

## 2021-07-28 DIAGNOSIS — I700 Atherosclerosis of aorta: Secondary | ICD-10-CM | POA: Diagnosis not present

## 2021-07-28 DIAGNOSIS — E7849 Other hyperlipidemia: Secondary | ICD-10-CM | POA: Diagnosis not present

## 2021-07-28 DIAGNOSIS — I10 Essential (primary) hypertension: Secondary | ICD-10-CM | POA: Diagnosis not present

## 2021-07-28 DIAGNOSIS — R252 Cramp and spasm: Secondary | ICD-10-CM | POA: Diagnosis not present

## 2021-07-28 DIAGNOSIS — E1165 Type 2 diabetes mellitus with hyperglycemia: Secondary | ICD-10-CM | POA: Diagnosis not present

## 2021-07-31 NOTE — Progress Notes (Signed)
Remote pacemaker transmission.   

## 2021-08-25 ENCOUNTER — Other Ambulatory Visit: Payer: Self-pay

## 2021-09-04 DIAGNOSIS — L110 Acquired keratosis follicularis: Secondary | ICD-10-CM | POA: Diagnosis not present

## 2021-09-04 DIAGNOSIS — B351 Tinea unguium: Secondary | ICD-10-CM | POA: Diagnosis not present

## 2021-09-04 DIAGNOSIS — M79672 Pain in left foot: Secondary | ICD-10-CM | POA: Diagnosis not present

## 2021-09-04 DIAGNOSIS — M79674 Pain in right toe(s): Secondary | ICD-10-CM | POA: Diagnosis not present

## 2021-09-04 DIAGNOSIS — M79671 Pain in right foot: Secondary | ICD-10-CM | POA: Diagnosis not present

## 2021-09-04 DIAGNOSIS — M79675 Pain in left toe(s): Secondary | ICD-10-CM | POA: Diagnosis not present

## 2021-09-04 DIAGNOSIS — E1140 Type 2 diabetes mellitus with diabetic neuropathy, unspecified: Secondary | ICD-10-CM | POA: Diagnosis not present

## 2021-09-09 ENCOUNTER — Encounter: Payer: Self-pay | Admitting: Nutrition

## 2021-09-09 ENCOUNTER — Encounter: Payer: Medicare Other | Attending: Family Medicine | Admitting: Nutrition

## 2021-09-09 DIAGNOSIS — I519 Heart disease, unspecified: Secondary | ICD-10-CM | POA: Insufficient documentation

## 2021-09-09 DIAGNOSIS — E118 Type 2 diabetes mellitus with unspecified complications: Secondary | ICD-10-CM | POA: Diagnosis not present

## 2021-09-09 DIAGNOSIS — Z853 Personal history of malignant neoplasm of breast: Secondary | ICD-10-CM | POA: Diagnosis not present

## 2021-09-09 DIAGNOSIS — I10 Essential (primary) hypertension: Secondary | ICD-10-CM | POA: Insufficient documentation

## 2021-09-09 NOTE — Patient Instructions (Signed)
Goals  Eat three meals per day at times discussed. Testing blood sugars twice a day Not eat after 7 pm Stop eating out as much. Go to [email or web address] website  Get A1C down to 7%

## 2021-09-09 NOTE — Progress Notes (Signed)
Medical Nutrition Therapy  Appointment Start time:  1400  Appointment End time:  1500  Primary concerns today:  Dm Type 2  Referral diagnosis: E11.8 Preferred learning style: No preference  Learning readiness: Ready    NUTRITION ASSESSMENT  88 yr old female with Type 2 Dm. Referred by Dr. Quintin Alto. She is wanting to get her DM under better control..Doesn't know what to eat.  History of breast cancer.  Lab Results  Component Value Date   HGBA1C 7.4 (H) 10/09/2020    Anthropometrics  Wt Readings from Last 3 Encounters:  04/07/21 169 lb 6.4 oz (76.8 kg)  01/14/21 175 lb (79.4 kg)  10/17/20 170 lb (77.1 kg)   Ht Readings from Last 3 Encounters:  04/07/21 5' 4.5" (1.638 m)  01/14/21 5' 4.5" (1.638 m)  10/17/20 '5\' 4"'$  (1.626 m)   There is no height or weight on file to calculate BMI. '@BMIFA'$ @ Facility age limit for growth %iles is 20 years. Facility age limit for growth %iles is 20 years.    Clinical Medical Hx: see chart Medications: Metfmormin 500 mg BID Labs:  Lab Results  Component Value Date   HGBA1C 7.4 (H) 10/09/2020      Latest Ref Rng & Units 04/07/2021    1:43 PM 10/10/2020    1:27 AM 10/09/2020    8:57 PM  CMP  Glucose 70 - 99 mg/dL 113  123    BUN 8 - 23 mg/dL 12  13    Creatinine 0.44 - 1.00 mg/dL 0.84  0.66  0.74   Sodium 135 - 145 mmol/L 136  137    Potassium 3.5 - 5.1 mmol/L 3.3  3.6    Chloride 98 - 111 mmol/L 99  103    CO2 22 - 32 mmol/L 29  26    Calcium 8.9 - 10.3 mg/dL 10.2  9.1    Total Protein 6.5 - 8.1 g/dL 7.4     Total Bilirubin 0.3 - 1.2 mg/dL 1.0     Alkaline Phos 38 - 126 U/L 47     AST 15 - 41 U/L 19     ALT 0 - 44 U/L 13      Lipid Panel     Component Value Date/Time   CHOL 155 10/10/2020 0127   TRIG 41 10/10/2020 0127   HDL 49 10/10/2020 0127   CHOLHDL 3.2 10/10/2020 0127   VLDL 8 10/10/2020 0127   LDLCALC 98 10/10/2020 0127    Notable Signs/Symptoms: none  Lifestyle & Dietary Hx   Estimated daily fluid intake: 24  oz Supplements: CoQ10 Sleep: fair Stress / self-care:  Current average weekly physical activity: ADL  24-Hr Dietary Recall Eats 2-3 times per day. Drinks water or diet sodas or green tea.  Estimated Energy Needs Calories: 1500 Carbohydrate  170g Protein: 112g Fat: 42g   NUTRITION DIAGNOSIS  NB-1.1 Food and nutrition-related knowledge deficit As related to Diabetes Type 2.  As evidenced by A1c 7.4.   NUTRITION INTERVENTION  Nutrition education (E-1) on the following topics:  Lifestyle Medicine  - Whole Food, Plant Predominant Nutrition is highly recommended: Eat Plenty of vegetables, Mushrooms, fruits, Legumes, Whole Grains, Nuts, seeds in lieu of processed meats, processed snacks/pastries red meat, poultry, eggs.    -It is better to avoid simple carbohydrates including: Cakes, Sweet Desserts, Ice Cream, Soda (diet and regular), Sweet Tea, Candies, Chips, Cookies, Store Bought Juices, Alcohol in Excess of  1-2 drinks a day, Lemonade,  Artificial Sweeteners, Doughnuts, Coffee Creamers, "Sugar-free"  Products, etc, etc.  This is not a complete list.....  Exercise: If you are able: 30 -60 minutes a day ,4 days a week, or 150 minutes a week.  The longer the better.  Combine stretch, strength, and aerobic activities.  If you were told in the past that you have high risk for cardiovascular diseases, you may seek evaluation by your heart doctor prior to initiating moderate to intense exercise programs.   Handouts Provided Include  Lifestyle Medicine  Learning Style & Readiness for Change Teaching method utilized: Visual & Auditory  Demonstrated degree of understanding via: Teach Back  Barriers to learning/adherence to lifestyle change: none  Goals Established by Pt Goals  Eat three meals per day at times discussed. Testing blood sugars twice a day Not eat after 7 pm Stop eating out as much. Go to [employer] website  Get A1C down to 7%   MONITORING &  EVALUATION Dietary intake, weekly physical activity, and blood sugars in 1-2 months.  Next Steps  Patient is to work on eating 3 meals and more foods from a garden.Marland Kitchen

## 2021-09-17 ENCOUNTER — Encounter: Payer: Self-pay | Admitting: Nutrition

## 2021-10-06 NOTE — Progress Notes (Unsigned)
Penny Huynh   [phone] [phone]   Clinic Follow up Note   Patient Care Team: Sasser, Silvestre Moment, MD as PCP - General (Cardiology) [name], MD (Inactive) as PCP - Cardiology (Cardiology) Truitt Merle, MD as Consulting Physician (Hematology) Excell Seltzer, MD (Inactive) as Consulting Physician (General Surgery) Alla Feeling, NP as Nurse Practitioner (Nurse Practitioner) [name], MD (Inactive) as Attending Physician (Cardiology) 10/08/2021  CHIEF COMPLAINT: Follow up left breast cancer   SUMMARY OF ONCOLOGIC HISTORY: Oncology History Overview Note  Cancer Staging Carcinoma of upper-inner quadrant of left breast in female, estrogen receptor positive ([place]) Staging form: Breast, AJCC 8th Edition - Clinical stage from 01/18/2017: Stage IB (cT2, cN0, cM0, G3, ER: Positive, PR: Positive, HER2: Positive) - Signed by Truitt Merle, MD on 02/10/2017 - Pathologic stage from 02/23/2017: Stage IB (pT3, pN0, cM0, G3, ER: Positive, PR: Positive, HER2: Positive) - Signed by Alla Feeling, NP on 03/16/2017  Carcinoma of upper-outer quadrant of left breast in female, estrogen receptor positive ([place]) Staging form: Breast, AJCC 8th Edition - Clinical stage from 01/18/2017: Stage IB (cT2, cN0, cM0, G2, ER: Positive, PR: Positive, HER2: Negative) - Unsigned - Pathologic stage from 02/23/2017: Stage IB (pT2, pN0(sn), cM0, G3, ER: Positive, PR: Positive, HER2: Negative) - Signed by Alla Feeling, NP on 03/16/2017     Carcinoma of upper-outer quadrant of left breast in female, estrogen receptor positive ([place])  01/13/2017 Mammogram   FINDINGS: In the left breast, a spiculated mass lies in the upper outer quadrant. In the medial posterior left breast, there is a larger lobulated mass. These masses correspond to the palpable abnormalities. No other discrete left breast masses.   In the right breast, there are no discrete masses. There are no areas  of architectural distortion.   In both breasts there are scattered calcifications, rounded punctate, with a few other larger coarse dystrophic calcifications, without significant change from the previous screening mammogram, which is dated 12/04/2009.   Mammographic images were processed with CAD.   On physical exam, there is a firm palpable mass in the upper outer left breast and another firm mass in the medial left breast.     01/13/2017 Breast US   Targeted ultrasound is performed, showing an irregular hypoechoic shadowing mass in the left breast at the 2:30 o'clock position, 5 cm the nipple, measuring 3.3 x 2.8 x 2.6 cm. In the 10:30 o'clock position of the left breast, 5 cm the nipple, there is irregular hypoechoic mass with somewhat more lobulated margins, corresponding to the lobulated mass seen mammographically. This measures 4.8 x 2.9 x 4.8 cm. Both masses show internal vascularity on color Doppler analysis.   Sonographic evaluation of the left axilla shows several nodes with thickened cortices. Status cortex measured is 5 mm. None of these lymph nodes appear enlarged but overall size criteria.   IMPRESSION: 1. Two masses in the left breast, 1 at the 2:30 o'clock position in the other at the 10:30 o'clock position, both highly suspicious for breast carcinoma. 2. Several abnormal left axillary lymph nodes with thickened cortices. 3. No evidence of right breast malignancy.     01/18/2017 Pathology Results   Diagnosis 1. Breast, left, needle core biopsy, 2:30 o'clock - INVASIVE MAMMARY CARCINOMA - MAMMARY CARCINOMA IN-SITU - SEE COMMENT 2. Lymph node, needle/core biopsy, left axilla - NO CARCINOMA IDENTIFIED - SEE COMMENT 3. Breast, left, needle core biopsy, 10:30 o'clock - INVASIVE DUCTAL CARCINOMA - SEE COMMENT  1.  PROGNOSTIC INDICATORS Results: IMMUNOHISTOCHEMICAL AND MORPHOMETRIC ANALYSIS PERFORMED MANUALLY Estrogen Receptor: 100%, POSITIVE, STRONG  STAINING INTENSITY Progesterone Receptor: 60%, POSITIVE, STRONG STAINING INTENSITY Proliferation Marker Ki67: 60%  1. FLUORESCENCE IN-SITU HYBRIDIZATION Results: HER2 - NEGATIVE RATIO OF HER2/CEP17 SIGNALS 1.28 AVERAGE HER2 COPY NUMBER PER CELL 2.75  3. PROGNOSTIC INDICATORS Results: IMMUNOHISTOCHEMICAL AND MORPHOMETRIC ANALYSIS PERFORMED MANUALLY Estrogen Receptor: 100%, POSITIVE, STRONG STAINING INTENSITY Progesterone Receptor: 50%, POSITIVE, STRONG STAINING INTENSITY Proliferation Marker Ki67: 70%  3. FLUORESCENCE IN-SITU HYBRIDIZATION Results: HER2 - **POSITIVE** RATIO OF HER2/CEP17 SIGNALS 2.80 AVERAGE HER2 COPY NUMBER PER CELL 6.15  Microscopic Comment 1. The biopsy material shows an infiltrative proliferation of cells with large vesicular nuclei with inconspicuous nucleoli, arranged linearly and in small clusters. Based on the biopsy, the carcinoma appears Nottingham grade 2 of 3 and measures 0.9 cm in greatest linear extent. E-cadherin and prognostic markers (ER/PR/ki-67/HER2-FISH)are pending and will be reported in an addendum. Dr. Lyndon Code reviewed the case and agrees with the above diagnosis.   02/10/2017 Initial Diagnosis   Carcinoma of upper-outer quadrant of left breast in female, estrogen receptor positive ([place])   02/19/2017 Imaging   Bone Scan whole Body 02/19/17 IMPRESSION: Negative for evidence of bony metastatic disease.   02/19/2017 Imaging   CT CAP W Contrast 02/19/17 IMPRESSION: 1. Small right middle lobe pulmonary nodules up to 0.8 cm, nonspecific. Non-contrast chest CT at 3-6 months is recommended. If the nodules are stable at time of repeat CT, then future CT at 18-24 months (from today's scan) is considered optional for low-risk patients, but is recommended for high-risk patients. This recommendation follows the consensus statement: Guidelines for Management of Incidental Pulmonary Nodules Detected on CT Images: From the Fleischner Society 2017;  Radiology 2017; 284:228-243. 2. Subcentimeter hepatic lesions likely cysts but incompletely characterized due to small size. Attention on follow-up imaging recommended. 3. 2.1 cm fusiform right common iliac artery aneurysm. Continued surveillance recommended. 4. Aortic Atherosclerosis (ICD10-I70.0) and Emphysema (ICD10-J43.9).   02/23/2017 Pathology Results   Diagnosis 1. Breast, simple mastectomy, Left Total - INVASIVE DUCTAL CARCINOMA, MULTIFOCAL, NOTTINGHAM GRADE 3/3 (5.3 CM, 3.5 CM) - INVASIVE CARCINOMA INVOLVES THE DERMIS - DUCTAL CARCINOMA IN SITU, INTERMEDIATE GRADE - HYALINIZED FIBROADENOMA - MARGINS UNINVOLVED BY CARCINOMA - NO CARCINOMA IDENTIFIED IN TWO LYMPH NODES (0/2) - SEE ONCOLOGY TABLE AND COMMENT BELOW 2. Lymph node, sentinel, biopsy, Left Axillary - NO CARCINOMA IDENTIFIED IN ONE LYMPH NODE (0/1) 3. Lymph node, sentinel, biopsy - NO CARCINOMA IDENTIFIED IN ONE LYMPH NODE (0/1) 4. Lymph node, sentinel, biopsy - NO CARCINOMA IDENTIFIED IN ONE LYMPH NODE (0/1) 5. Lymph node, sentinel, biopsy - NO CARCINOMA IDENTIFIED IN ONE LYMPH NODE (0/1) Microscopic Comment 1. BREAST, INVASIVE TUMOR Procedure: Simple mastectomy Laterality: Left Tumor Size: 5. 3 cm, 3.5 cm Histologic Type: Invasive carcinoma of no special type (ductal, not otherwise specified) Grade: Nottingham Grade 3 Tubular Differentiation: 3 Nuclear Pleomorphism: 3 Mitotic Count: 2 Ductal Carcinoma in Situ (DCIS): Present Extent of Tumor: Skin: Invasive carcinoma directly invades into the dermis or epidermis without skin ulceration Margins: Invasive carcinoma, distance from closest margin: 1.5 cm (posterior) DCIS, distance from closest margin: > 1 cm Regional Lymph Nodes: Number of Lymph Nodes Examined: 6 Number of Sentinel Lymph Nodes Examined: 4 Lymph Nodes with Macrometastases: 0 Lymph Nodes with Micrometastases: 0 Lymph Nodes with Isolated Tumor Cells: 0 Treatment effect: No known presurgical  therapy Breast Prognostic Profile: See Also ([id number]) Estrogen Receptor: Positive (100%, strong); Positive (100%, strong) Progesterone Receptor: Positive (50%, strong); Positive (50%, strong)  Her2: Positive (Ratio 2.80); Negative (Ratio 1.28) Ki-67: 70%; 60% Best tumor block for sendout testing: 1E Pathologic Stage Classification (pTNM, AJCC 8th Edition): Primary Tumor: mpT3 Regional Lymph Nodes: pN0 COMMENT: The two invasive carcinomas in the breast have slightly different morphologies. The larger lesion has a papillary and micropapillary pattern admixed with typical ductal carcinoma while the smaller lesion is more typical of a ductal lesion with linear arrays (E-cadherin performed on biopsy). There are foci within the larger lesion that are concerning for lymphovascular space invasion.    04/07/2017 - 04/27/2018 Chemotherapy   weekly taxol and herceptin x12 weeks, 04/07/17-06/23/17. Then herceptin q3 weeks for total 1 year which was switched to [place] on 07/14/17. Stopped after 2 doses due to worsening neuropathy. Switched back to maintenance Herceptin started 8/12019. Completed on 04/27/18.    07/20/2017 - 09/03/2017 Radiation Therapy   She started adjuvant radiation on 07/20/17 with Dr. Lisbeth Renshaw and plans to complete on 09/03/17.    10/2017 -  Anti-estrogen oral therapy   Anastrozole 87m daily starting 10/2017   09/27/2018 Procedure   She had her PAC removed on 09/27/18.      CURRENT THERAPY: Anastrozole 1 mg daily, starting 10/2017  INTERVAL HISTORY: Ms. EVallinreturns for follow up as scheduled. Last seen by Dr. FBurr Medico3/6/23. She continues Anastrozole, tolerating well without significant side effects.  Energy and appetite are adequate, mild weight loss has been somewhat unintentional but she is walking more and remains active.  Neuropathy has improved in her feet, not bothering her anymore in her hands.  She carries a cane but does not use it much although for the past 2-3 months she  feels more off balance.  Denies dizziness or fall.  She had a headache last week that lasted 2 or 3 days after she thought because her hair was pulled too tight.  This eventually resolved and has not returned.  Denies recent infection, dyspnea, bone pain, nausea/vomiting, change in bowel habits (chronic constipation), or any other new specific complaints.  All other systems were reviewed with the patient and are negative.  MEDICAL HISTORY:  Past Medical History:  Diagnosis Date   Arthritis    "maybe a little in my left knee" (02/23/2017)   Asthmatic bronchitis    [place] [place]   Coronary atherosclerosis    Minor nonobstructive CAD at cardiac catheterization 2009   Dyslipidemia    Femoral bruit 7/08   With normal ABIs   Hypertension    Iliac artery aneurysm ([facility])    Lower extremity edema    Chronic   Pneumonia ~ 1950   in 8th grade   Type II diabetes mellitus ([facility]     SURGICAL HISTORY: Past Surgical History:  Procedure Laterality Date   APPENDECTOMY     BREAST BIOPSY Left 02/2017   CARDIAC CATHETERIZATION     "long time ago" (02/23/2017)   MASTECTOMY COMPLETE / SIMPLE W/ SENTINEL NODE BIOPSY Left 02/23/2017   TOTAL MATECTOMY;  LEFT AXILLARY SENTINEL LYMPH NODE BIOPSY ERAS PATHWAY   MASTECTOMY WITH RADIOACTIVE SEED GUIDED EXCISION AND AXILLARY SENTINEL LYMPH NODE BIOPSY Left 02/23/2017   Procedure: LEFT TOTAL MATECTOMY;  LEFT AXILLARY SENTINEL LYMPH NODE BIOPSY ERAS PATHWAY;  Surgeon: HExcell Seltzer MD;  [facility]  Service: General;  Laterality: Left;   PACEMAKER IMPLANT N/A 10/10/2020   Procedure: PACEMAKER IMPLANT;  Surgeon: TEvans Lance MD;  [facility];  Service: Cardiovascular;  Laterality: N/A;   PILONIDAL CYST  EXCISION     PORT-A-CATH REMOVAL Right 09/27/2018   Procedure: REMOVAL PORT-A-CATH;  Surgeon: Fanny Skates, MD;  Location: [facility];  Service: General;  Laterality: Right;   PORTA CATH INSERTION   02/23/2017   PORTACATH PLACEMENT N/A 02/23/2017   Procedure: INSERTION PORT-A-CATH;  Surgeon: Excell Seltzer, MD;  Location: [place];  Service: General;  Laterality: N/A;   SHOULDER ARTHROSCOPY W/ ROTATOR CUFF REPAIR Right     I have reviewed the social history and family history with the patient and they are unchanged from previous note.  ALLERGIES:  has No Known Allergies.  MEDICATIONS:  Current Outpatient Medications  Medication Sig Dispense Refill   albuterol (VENTOLIN HFA) 108 (90 Base) MCG/ACT inhaler Inhale 1 puff into the lungs every 6 (six) hours as needed for wheezing or shortness of breath.     alendronate (FOSAMAX) 70 MG tablet Take 70 mg by mouth once a week.     amLODipine (NORVASC) 5 MG tablet Take 1 tablet (5 mg total) by mouth daily. 90 tablet 3   anastrozole (ARIMIDEX) 1 MG tablet Take 1 tablet (1 mg total) by mouth daily. 90 tablet 3   aspirin 81 MG EC tablet Take 81 mg by mouth at bedtime.     chlorthalidone (HYGROTON) 25 MG tablet Take 25 mg by mouth daily.     Coenzyme Q10 (COQ10) 100 MG CAPS Take 100 mg by mouth daily.     cyclobenzaprine (FLEXERIL) 10 MG tablet Take 5-10 mg by mouth at bedtime as needed. As directed     latanoprost (XALATAN) 0.005 % ophthalmic solution Place 1 drop into both eyes at bedtime.     losartan (COZAAR) 100 MG tablet Take 100 mg by mouth daily.     metFORMIN (GLUCOPHAGE) 500 MG tablet Take 1,000 mg by mouth daily.     pregabalin (LYRICA) 150 MG capsule TAKE ONE CAPSULE BY MOUTH TWICE A DAY 60 capsule 2   rosuvastatin (CRESTOR) 5 MG tablet Take 5 mg by mouth every other day.     vitamin B-12 (CYANOCOBALAMIN) 1000 MCG tablet Take 1,000 mcg by mouth daily.     Vitamin D, Cholecalciferol, 25 MCG (1000 UT) CAPS Take 25 mcg by mouth daily.     No current facility-administered medications for this visit.    PHYSICAL EXAMINATION: ECOG PERFORMANCE STATUS: 1 - Symptomatic but completely ambulatory  Vitals:   10/08/21 1028  BP: (!) 163/71   Pulse: 63  Resp: 15  Temp: 97.7 F (36.5 C)  SpO2: 100%   Filed Weights   10/08/21 1028  Weight: 168 lb 9.6 oz (76.5 kg)    GENERAL:alert, no distress and comfortable SKIN: No rash HEENT: sclera clear.  PERRLA.  No palpable head tenderness NECK: Without mass LYMPH:  no palpable cervical or supraclavicular lymphadenopathy  LUNGS:  normal breathing effort HEART: no lower extremity edema ABDOMEN:abdomen soft, non-tender and normal bowel sounds Musculoskeletal: No focal spinal tenderness NEURO: alert & oriented x 3 with fluent speech, no focal motor deficits Breast exam: No right nipple discharge or inversion.  S/p left mastectomy, incision completely healed with moderate scar tissue.  No palpable mass or nodularity along the left scar, chest wall, right breast, or either axilla that could appreciate.  LABORATORY DATA:  I have reviewed the data as listed    Latest Ref Rng & Units 10/08/2021    9:56 AM 04/07/2021    1:43 PM 10/10/2020    1:27 AM  CBC  WBC 4.0 - 10.5  K/uL 3.3  3.6  4.3   Hemoglobin 12.0 - 15.0 g/dL 11.9  12.5  11.8   Hematocrit 36.0 - 46.0 % 36.3  38.5  36.0   Platelets 150 - 400 K/uL 232  224  183         Latest Ref Rng & Units 10/08/2021    9:56 AM 04/07/2021    1:43 PM 10/10/2020    1:27 AM  CMP  Glucose 70 - 99 mg/dL 175  113  123   BUN 8 - 23 mg/dL 10  12  13    Creatinine 0.44 - 1.00 mg/dL 0.74  0.84  0.66   Sodium 135 - 145 mmol/L 133  136  137   Potassium 3.5 - 5.1 mmol/L 4.0  3.3  3.6   Chloride 98 - 111 mmol/L 97  99  103   CO2 22 - 32 mmol/L 32  29  26   Calcium 8.9 - 10.3 mg/dL 9.7  10.2  9.1   Total Protein 6.5 - 8.1 g/dL 7.1  7.4    Total Bilirubin 0.3 - 1.2 mg/dL 0.8  1.0    Alkaline Phos 38 - 126 U/L 44  47    AST 15 - 41 U/L 18  19    ALT 0 - 44 U/L 12  13        RADIOGRAPHIC STUDIES: I have personally reviewed the radiological images as listed and agreed with the findings in the report. No results found.   ASSESSMENT & PLAN: Penny Huynh is an 88 y.o. female    1.  Multifocal invasive ductal carcinoma of the left breast of female; carcinoma of the upper-outer quadrant of left breast and female, ER/PR positive, HER-2 negative pT2N0M0, G2, Stage 1B; IDC of the upper-inner quadrant left breast, ER+/PR+/HER2+, pT3N0M0 G3, stage IB -Diagnosed in 01/2017, s/p left mastectomy 02/23/2017, adjuvant chemo and Herceptin and adjuvant radiation -Began antiestrogen with anastrozole in 10/2017, goal of 5 years (10/2022), tolerating well -03/2021 right mammo was benign -Penny Huynh is clinically doing well.  Tolerating anastrozole.  Breast exam is benign.  Labs are stable.  Overall no clinical concern for breast cancer recurrence -She is nearing 5 years from initial diagnosis, continue surveillance and anastrozole.  Next R mammo 03/2022 -Follow-up in 6 months, or sooner if needed  2.  Balance issue, headaches -She has 2-3 months of feeling off balance despite improvement in her peripheral neuropathy  -She had 1 episode of a mild headache last week that lasted 2-3 days -She feels adequately hydrated, remains active, denies nausea/vomiting, or any other known precipitating event -Exam is unremarkable -we reviewed the wide differential for headaches. But given her previous HER2+ breast cancer this should be monitored.  -we agreed to phone f/up in 3 months. She knows to call sooner if headaches become more frequent/severe and balance is worse.  -may send her back to Dr. Mickeal Skinner vs proceed with brain MRI at that point  3. Anemia and neutropenia -Mild and intermittent since chemo, Hgb 11.9, ANC 1.6 today -Reviewed infection precautions -Continue monitoring  4. 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 Mickeal Skinner as needed  5. HTN, CAD, DM, bradycardia and third-degree AV block -Per PCP and cardiology  6.  Osteopenia -Improved on last DEXA 10/2020, lowest T score -1.9 (improved from -2.6  which was osteoporosis) -Continue calcium, vitamin D, and repeat DEXA in 10/2022  Plan: -Labs reviewed -Continue breast cancer surveillance and anastrozole -Right  mammo 03/2022, next DEXA 10/2022 -Phone follow-up in 3 months for headaches and balance issues -Next in person surveillance visit in 6 months, or sooner if needed    Orders Placed This Encounter  Procedures   MM 3D SCREEN BREAST UNI RIGHT    Standing Status:   Future    Standing Expiration Date:   10/09/2022    Order Specific Question:   Reason for Exam (SYMPTOM  OR DIAGNOSIS REQUIRED)    Answer:   L breast cancer 2018 s/p mastectomy    Order Specific Question:   Preferred imaging location?    Answer:   [facility]   All questions were answered. The patient knows to call the clinic with any problems, questions or concerns. No barriers to learning was detected. I spent 20 minutes counseling the patient face to face. The total time spent in the appointment was 30 minutes and more than 50% was on counseling and review of test results.     Alla Feeling, NP 10/08/21

## 2021-10-08 ENCOUNTER — Encounter: Payer: Self-pay | Admitting: Nurse Practitioner

## 2021-10-08 ENCOUNTER — Inpatient Hospital Stay: Payer: Medicare Other | Attending: Nurse Practitioner | Admitting: Nurse Practitioner

## 2021-10-08 ENCOUNTER — Inpatient Hospital Stay: Payer: Medicare Other

## 2021-10-08 ENCOUNTER — Other Ambulatory Visit: Payer: Self-pay

## 2021-10-08 VITALS — BP 163/71 | HR 63 | Temp 97.7°F | Resp 15 | Wt 168.6 lb

## 2021-10-08 DIAGNOSIS — R519 Headache, unspecified: Secondary | ICD-10-CM | POA: Insufficient documentation

## 2021-10-08 DIAGNOSIS — C50212 Malignant neoplasm of upper-inner quadrant of left female breast: Secondary | ICD-10-CM | POA: Diagnosis not present

## 2021-10-08 DIAGNOSIS — D701 Agranulocytosis secondary to cancer chemotherapy: Secondary | ICD-10-CM | POA: Insufficient documentation

## 2021-10-08 DIAGNOSIS — Z170 Estrogen receptor positive status [ER+]: Secondary | ICD-10-CM | POA: Insufficient documentation

## 2021-10-08 DIAGNOSIS — Z1231 Encounter for screening mammogram for malignant neoplasm of breast: Secondary | ICD-10-CM | POA: Diagnosis not present

## 2021-10-08 DIAGNOSIS — G620 Drug-induced polyneuropathy: Secondary | ICD-10-CM | POA: Diagnosis not present

## 2021-10-08 DIAGNOSIS — Z79811 Long term (current) use of aromatase inhibitors: Secondary | ICD-10-CM | POA: Insufficient documentation

## 2021-10-08 DIAGNOSIS — D6481 Anemia due to antineoplastic chemotherapy: Secondary | ICD-10-CM | POA: Diagnosis not present

## 2021-10-08 DIAGNOSIS — C50412 Malignant neoplasm of upper-outer quadrant of left female breast: Secondary | ICD-10-CM | POA: Insufficient documentation

## 2021-10-08 DIAGNOSIS — M8580 Other specified disorders of bone density and structure, unspecified site: Secondary | ICD-10-CM | POA: Diagnosis not present

## 2021-10-08 LAB — CBC WITH DIFFERENTIAL (CANCER CENTER ONLY)
Abs Immature Granulocytes: 0.01 10*3/uL (ref 0.00–0.07)
Basophils Absolute: 0 10*3/uL (ref 0.0–0.1)
Basophils Relative: 1 %
Eosinophils Absolute: 0.1 10*3/uL (ref 0.0–0.5)
Eosinophils Relative: 2 %
HCT: 36.3 % (ref 36.0–46.0)
Hemoglobin: 11.9 g/dL — ABNORMAL LOW (ref 12.0–15.0)
Immature Granulocytes: 0 %
Lymphocytes Relative: 38 %
Lymphs Abs: 1.3 10*3/uL (ref 0.7–4.0)
MCH: 29.5 pg (ref 26.0–34.0)
MCHC: 32.8 g/dL (ref 30.0–36.0)
MCV: 90.1 fL (ref 80.0–100.0)
Monocytes Absolute: 0.3 10*3/uL (ref 0.1–1.0)
Monocytes Relative: 10 %
Neutro Abs: 1.6 10*3/uL — ABNORMAL LOW (ref 1.7–7.7)
Neutrophils Relative %: 49 %
Platelet Count: 232 10*3/uL (ref 150–400)
RBC: 4.03 MIL/uL (ref 3.87–5.11)
RDW: 13.2 % (ref 11.5–15.5)
WBC Count: 3.3 10*3/uL — ABNORMAL LOW (ref 4.0–10.5)
nRBC: 0 % (ref 0.0–0.2)

## 2021-10-08 LAB — CMP (CANCER CENTER ONLY)
ALT: 12 U/L (ref 0–44)
AST: 18 U/L (ref 15–41)
Albumin: 4.2 g/dL (ref 3.5–5.0)
Alkaline Phosphatase: 44 U/L (ref 38–126)
Anion gap: 4 — ABNORMAL LOW (ref 5–15)
BUN: 10 mg/dL (ref 8–23)
CO2: 32 mmol/L (ref 22–32)
Calcium: 9.7 mg/dL (ref 8.9–10.3)
Chloride: 97 mmol/L — ABNORMAL LOW (ref 98–111)
Creatinine: 0.74 mg/dL (ref 0.44–1.00)
GFR, Estimated: >60 mL/min (ref >60)
Glucose, Bld: 175 mg/dL — ABNORMAL HIGH (ref 70–99)
Potassium: 4 mmol/L (ref 3.5–5.1)
Sodium: 133 mmol/L — ABNORMAL LOW (ref 135–145)
Total Bilirubin: 0.8 mg/dL (ref 0.3–1.2)
Total Protein: 7.1 g/dL (ref 6.5–8.1)

## 2021-10-08 MED ORDER — ANASTROZOLE 1 MG PO TABS: 1.0000 mg | ORAL_TABLET | Freq: Every day | ORAL | 3 refills | Status: DC

## 2021-10-10 ENCOUNTER — Telehealth: Payer: Self-pay | Admitting: Hematology

## 2021-10-10 NOTE — Telephone Encounter (Signed)
Left message with follow-up appointments per 9/6 los. 

## 2021-10-23 ENCOUNTER — Ambulatory Visit (INDEPENDENT_AMBULATORY_CARE_PROVIDER_SITE_OTHER): Payer: Medicare Other

## 2021-10-23 DIAGNOSIS — M79675 Pain in left toe(s): Secondary | ICD-10-CM | POA: Diagnosis not present

## 2021-10-23 DIAGNOSIS — M79672 Pain in left foot: Secondary | ICD-10-CM | POA: Diagnosis not present

## 2021-10-23 DIAGNOSIS — I442 Atrioventricular block, complete: Secondary | ICD-10-CM | POA: Diagnosis not present

## 2021-10-23 DIAGNOSIS — B351 Tinea unguium: Secondary | ICD-10-CM | POA: Diagnosis not present

## 2021-10-23 DIAGNOSIS — E1140 Type 2 diabetes mellitus with diabetic neuropathy, unspecified: Secondary | ICD-10-CM | POA: Diagnosis not present

## 2021-10-23 LAB — CUP PACEART REMOTE DEVICE CHECK
Battery Remaining Longevity: 97 mo
Battery Remaining Percentage: 91 %
Battery Voltage: 3.01 V
Brady Statistic AP VP Percent: 23 %
Brady Statistic AP VS Percent: 1 %
Brady Statistic AS VP Percent: 77 %
Brady Statistic AS VS Percent: 1 %
Brady Statistic RA Percent Paced: 22 %
Brady Statistic RV Percent Paced: 99 %
Date Time Interrogation Session: 20230921020012
Implantable Lead Implant Date: 20220908
Implantable Lead Implant Date: 20220908
Implantable Lead Location: 753859
Implantable Lead Location: 753860
Implantable Pulse Generator Implant Date: 20220908
Lead Channel Impedance Value: 510 Ohm
Lead Channel Impedance Value: 580 Ohm
Lead Channel Pacing Threshold Amplitude: 0.5 V
Lead Channel Pacing Threshold Amplitude: 0.75 V
Lead Channel Pacing Threshold Pulse Width: 0.5 ms
Lead Channel Pacing Threshold Pulse Width: 0.5 ms
Lead Channel Sensing Intrinsic Amplitude: 1.4 mV
Lead Channel Sensing Intrinsic Amplitude: 12 mV
Lead Channel Setting Pacing Amplitude: 2 V
Lead Channel Setting Pacing Amplitude: 2.5 V
Lead Channel Setting Pacing Pulse Width: 0.5 ms
Lead Channel Setting Sensing Sensitivity: 2 mV
Pulse Gen Model: 2272
Pulse Gen Serial Number: 6519562

## 2021-11-03 NOTE — Progress Notes (Signed)
Remote pacemaker transmission.   

## 2021-11-05 ENCOUNTER — Encounter: Payer: Medicare Other | Attending: Family Medicine | Admitting: Nutrition

## 2021-11-05 VITALS — Ht 64.5 in | Wt 163.8 lb

## 2021-11-05 DIAGNOSIS — I10 Essential (primary) hypertension: Secondary | ICD-10-CM | POA: Insufficient documentation

## 2021-11-05 DIAGNOSIS — I519 Heart disease, unspecified: Secondary | ICD-10-CM | POA: Diagnosis not present

## 2021-11-05 DIAGNOSIS — Z853 Personal history of malignant neoplasm of breast: Secondary | ICD-10-CM | POA: Insufficient documentation

## 2021-11-05 DIAGNOSIS — E118 Type 2 diabetes mellitus with unspecified complications: Secondary | ICD-10-CM | POA: Insufficient documentation

## 2021-11-05 NOTE — Progress Notes (Unsigned)
Medical Nutrition Therapy  Appointment Start time:  4332 Appointment End time:  9518  Primary concerns today:  Dm Type 2  Referral diagnosis: E11.8 Preferred learning style: No preference  Learning readiness: Ready    NUTRITION ASSESSMENT  88 yr old female with Type 2 Dm. Referred by Dr. Quintin Alto. Metformin 1000 mg once a day  History of breast cancer. FBS 116 mg/dl this am.  Meter brought in. AVG 7 day avg 138 mg/dl, 14 DAY WAS 125 mg/dl,  30 DAYS IS 132 MG/DL. Blood sugars have improved. Lost 1 lb. Changes:  Has been cutting out more processed foods and eating smaller portions. Walking 2 miles per day. Eating more plant based meals. Feels good and enjoys eating plant based foods.  Goes to see PCP in the next few weeks. Will get A1C checked then.  Lab Results  Component Value Date   HGBA1C 7.4 (H) 10/09/2020    Anthropometrics  Wt Readings from Last 3 Encounters:  10/08/21 168 lb 9.6 oz (76.5 kg)  04/07/21 169 lb 6.4 oz (76.8 kg)  01/14/21 175 lb (79.4 kg)   Ht Readings from Last 3 Encounters:  04/07/21 5' 4.5" (1.638 m)  01/14/21 5' 4.5" (1.638 m)  10/17/20 '5\' 4"'$  (1.626 m)   There is no height or weight on file to calculate BMI. '@BMIFA'$ @ Facility age limit for growth %iles is 20 years. Facility age limit for growth %iles is 20 years.    Clinical Medical Hx: see chart Medications: Metfmormin 500 mg BID Labs:  Lab Results  Component Value Date   HGBA1C 7.4 (H) 10/09/2020      Latest Ref Rng & Units 10/08/2021    9:56 AM 04/07/2021    1:43 PM 10/10/2020    1:27 AM  CMP  Glucose 70 - 99 mg/dL 175  113  123   BUN 8 - 23 mg/dL '10  12  13   '$ Creatinine 0.44 - 1.00 mg/dL 0.74  0.84  0.66   Sodium 135 - 145 mmol/L 133  136  137   Potassium 3.5 - 5.1 mmol/L 4.0  3.3  3.6   Chloride 98 - 111 mmol/L 97  99  103   CO2 22 - 32 mmol/L 32  29  26   Calcium 8.9 - 10.3 mg/dL 9.7  10.2  9.1   Total Protein 6.5 - 8.1 g/dL 7.1  7.4    Total Bilirubin 0.3 - 1.2 mg/dL 0.8  1.0     Alkaline Phos 38 - 126 U/L 44  47    AST 15 - 41 U/L 18  19    ALT 0 - 44 U/L 12  13     Lipid Panel     Component Value Date/Time   CHOL 155 10/10/2020 0127   TRIG 41 10/10/2020 0127   HDL 49 10/10/2020 0127   CHOLHDL 3.2 10/10/2020 0127   VLDL 8 10/10/2020 0127   LDLCALC 98 10/10/2020 0127    Notable Signs/Symptoms: none  Lifestyle & Dietary Hx   Estimated daily fluid intake: 24 oz Supplements: CoQ10 Sleep: fair Stress / self-care:  Current average weekly physical activity: ADL  24-Hr Dietary Recall  B) Cheerios with fruit,  with almond, Hot tea L)  Steamed vegetables and shrimp, salmon, sweet and sour soup, water D) Was too full from lunch:    Estimated Energy Needs Calories: 1500 Carbohydrate  170g Protein: 112g Fat: 42g   NUTRITION DIAGNOSIS  NB-1.1 Food and nutrition-related knowledge deficit As related to  Diabetes Type 2.  As evidenced by A1c 7.4.   NUTRITION INTERVENTION  Nutrition education (E-1) on the following topics:  Lifestyle Medicine  - Whole Food, Plant Predominant Nutrition is highly recommended: Eat Plenty of vegetables, Mushrooms, fruits, Legumes, Whole Grains, Nuts, seeds in lieu of processed meats, processed snacks/pastries red meat, poultry, eggs.    -It is better to avoid simple carbohydrates including: Cakes, Sweet Desserts, Ice Cream, Soda (diet and regular), Sweet Tea, Candies, Chips, Cookies, Store Bought Juices, Alcohol in Excess of  1-2 drinks a day, Lemonade,  Artificial Sweeteners, Doughnuts, Coffee Creamers, "Sugar-free" Products, etc, etc.  This is not a complete list.....  Exercise: If you are able: 30 -60 minutes a day ,4 days a week, or 150 minutes a week.  The longer the better.  Combine stretch, strength, and aerobic activities.  If you were told in the past that you have high risk for cardiovascular diseases, you may seek evaluation by your heart doctor prior to initiating moderate to intense exercise programs.   Handouts  Provided Include  Lifestyle Medicine  Learning Style & Readiness for Change Teaching method utilized: Visual & Auditory  Demonstrated degree of understanding via: Teach Back  Barriers to learning/adherence to lifestyle change: none  Goals Established by Pt Goals Goals  Try hummus for a dip with vegetables as desired. Continue to focus on plant based foods Get A1c to less than 7%.    MONITORING & EVALUATION Follow up in 3/4 months.  Next Steps  Patient is to work on eating 3 meals and more foods from a garden.Marland Kitchen

## 2021-11-05 NOTE — Patient Instructions (Signed)
Goals  Try hummus for a dip with vegetables as desired. Continue to focus on plant based foods Get A1c to less than 7%.

## 2021-11-06 ENCOUNTER — Other Ambulatory Visit: Payer: Self-pay

## 2021-11-06 ENCOUNTER — Encounter: Payer: Self-pay | Admitting: Nutrition

## 2021-11-06 DIAGNOSIS — H04123 Dry eye syndrome of bilateral lacrimal glands: Secondary | ICD-10-CM | POA: Diagnosis not present

## 2021-11-06 DIAGNOSIS — Z961 Presence of intraocular lens: Secondary | ICD-10-CM | POA: Diagnosis not present

## 2021-11-06 DIAGNOSIS — H401131 Primary open-angle glaucoma, bilateral, mild stage: Secondary | ICD-10-CM | POA: Diagnosis not present

## 2021-12-05 DIAGNOSIS — R030 Elevated blood-pressure reading, without diagnosis of hypertension: Secondary | ICD-10-CM | POA: Diagnosis not present

## 2021-12-05 DIAGNOSIS — Z6828 Body mass index (BMI) 28.0-28.9, adult: Secondary | ICD-10-CM | POA: Diagnosis not present

## 2021-12-05 DIAGNOSIS — R0602 Shortness of breath: Secondary | ICD-10-CM | POA: Diagnosis not present

## 2021-12-05 DIAGNOSIS — R059 Cough, unspecified: Secondary | ICD-10-CM | POA: Diagnosis not present

## 2022-01-06 NOTE — Progress Notes (Unsigned)
[place]   Telephone:([phone]) [phone] Fax:(336) [phone]   Clinic Follow up Note   Patient Care Team: Manon Hilding, MD as PCP - General (Cardiology) [name], MD (Inactive) as PCP - Cardiology (Cardiology) Truitt Merle, MD as Consulting Physician (Hematology) Excell Seltzer, MD (Inactive) as Consulting Physician (General Surgery) Alla Feeling, NP as Nurse Practitioner (Nurse Practitioner) [name], MD (Inactive) as Attending Physician (Cardiology) 01/07/2022  I connected with [name] on 01/07/22 at 10:00 AM EST by telephone visit and verified that I am speaking with the correct person using two identifiers.   I discussed the limitations, risks, security and privacy concerns of performing an evaluation and management service by telemedicine and the availability of in-person appointments. I also discussed with the patient that there may be a patient responsible charge related to this service. The patient expressed understanding and agreed to proceed.   Other persons participating in the visit and their role in the encounter: None   Patient's location: Home Provider's location: [place]: Follow up weight loss, balance issue, and headache  SUMMARY OF ONCOLOGIC HISTORY: Oncology History Overview Note  Cancer Staging Carcinoma of upper-inner quadrant of left breast in female, estrogen receptor positive ([place]) Staging form: Breast, AJCC 8th Edition - Clinical stage from 01/18/2017: Stage IB (cT2, cN0, cM0, G3, ER: Positive, PR: Positive, HER2: Positive) - Signed by Truitt Merle, MD on 02/10/2017 - Pathologic stage from 02/23/2017: Stage IB (pT3, pN0, cM0, G3, ER: Positive, PR: Positive, HER2: Positive) - Signed by Alla Feeling, NP on 03/16/2017  Carcinoma of upper-outer quadrant of left breast in female, estrogen receptor positive ([place]) Staging form: Breast, AJCC 8th Edition - Clinical stage from 01/18/2017: Stage IB  (cT2, cN0, cM0, G2, ER: Positive, PR: Positive, HER2: Negative) - Unsigned - Pathologic stage from 02/23/2017: Stage IB (pT2, pN0(sn), cM0, G3, ER: Positive, PR: Positive, HER2: Negative) - Signed by Alla Feeling, NP on 03/16/2017     Carcinoma of upper-outer quadrant of left breast in female, estrogen receptor positive ([place])  01/13/2017 Mammogram   FINDINGS: In the left breast, a spiculated mass lies in the upper outer quadrant. In the medial posterior left breast, there is a larger lobulated mass. These masses correspond to the palpable abnormalities. No other discrete left breast masses.   In the right breast, there are no discrete masses. There are no areas of architectural distortion.   In both breasts there are scattered calcifications, rounded punctate, with a few other larger coarse dystrophic calcifications, without significant change from the previous screening mammogram, which is dated 12/04/2009.   Mammographic images were processed with CAD.   On physical exam, there is a firm palpable mass in the upper outer left breast and another firm mass in the medial left breast.     01/13/2017 Breast US   Targeted ultrasound is performed, showing an irregular hypoechoic shadowing mass in the left breast at the 2:30 o'clock position, 5 cm the nipple, measuring 3.3 x 2.8 x 2.6 cm. In the 10:30 o'clock position of the left breast, 5 cm the nipple, there is irregular hypoechoic mass with somewhat more lobulated margins, corresponding to the lobulated mass seen mammographically. This measures 4.8 x 2.9 x 4.8 cm. Both masses show internal vascularity on color Doppler analysis.   Sonographic evaluation of the left axilla shows several nodes with thickened cortices. Status cortex measured is 5 mm. None of these lymph nodes appear enlarged but overall  size criteria.   IMPRESSION: 1. Two masses in the left breast, 1 at the 2:30 o'clock position in the other at the 10:30 o'clock  position, both highly suspicious for breast carcinoma. 2. Several abnormal left axillary lymph nodes with thickened cortices. 3. No evidence of right breast malignancy.     01/18/2017 Pathology Results   Diagnosis 1. Breast, left, needle core biopsy, 2:30 o'clock - INVASIVE MAMMARY CARCINOMA - MAMMARY CARCINOMA IN-SITU - SEE COMMENT 2. Lymph node, needle/core biopsy, left axilla - NO CARCINOMA IDENTIFIED - SEE COMMENT 3. Breast, left, needle core biopsy, 10:30 o'clock - INVASIVE DUCTAL CARCINOMA - SEE COMMENT  1. PROGNOSTIC INDICATORS Results: IMMUNOHISTOCHEMICAL AND MORPHOMETRIC ANALYSIS PERFORMED MANUALLY Estrogen Receptor: 100%, POSITIVE, STRONG STAINING INTENSITY Progesterone Receptor: 60%, POSITIVE, STRONG STAINING INTENSITY Proliferation Marker Ki67: 60%  1. FLUORESCENCE IN-SITU HYBRIDIZATION Results: HER2 - NEGATIVE RATIO OF HER2/CEP17 SIGNALS 1.28 AVERAGE HER2 COPY NUMBER PER CELL 2.75  3. PROGNOSTIC INDICATORS Results: IMMUNOHISTOCHEMICAL AND MORPHOMETRIC ANALYSIS PERFORMED MANUALLY Estrogen Receptor: 100%, POSITIVE, STRONG STAINING INTENSITY Progesterone Receptor: 50%, POSITIVE, STRONG STAINING INTENSITY Proliferation Marker Ki67: 70%  3. FLUORESCENCE IN-SITU HYBRIDIZATION Results: HER2 - **POSITIVE** RATIO OF HER2/CEP17 SIGNALS 2.80 AVERAGE HER2 COPY NUMBER PER CELL 6.15  Microscopic Comment 1. The biopsy material shows an infiltrative proliferation of cells with large vesicular nuclei with inconspicuous nucleoli, arranged linearly and in small clusters. Based on the biopsy, the carcinoma appears Nottingham grade 2 of 3 and measures 0.9 cm in greatest linear extent. E-cadherin and prognostic markers (ER/PR/ki-67/HER2-FISH)are pending and will be reported in an addendum. Dr. Lyndon Code reviewed the case and agrees with the above diagnosis.   02/10/2017 Initial Diagnosis   Carcinoma of upper-outer quadrant of left breast in female, estrogen receptor positive  ([place])   02/19/2017 Imaging   Bone Scan whole Body 02/19/17 IMPRESSION: Negative for evidence of bony metastatic disease.   02/19/2017 Imaging   CT CAP W Contrast 02/19/17 IMPRESSION: 1. Small right middle lobe pulmonary nodules up to 0.8 cm, nonspecific. Non-contrast chest CT at 3-6 months is recommended. If the nodules are stable at time of repeat CT, then future CT at 18-24 months (from today's scan) is considered optional for low-risk patients, but is recommended for high-risk patients. This recommendation follows the consensus statement: Guidelines for Management of Incidental Pulmonary Nodules Detected on CT Images: From the Fleischner Society 2017; Radiology 2017; 284:228-243. 2. Subcentimeter hepatic lesions likely cysts but incompletely characterized due to small size. Attention on follow-up imaging recommended. 3. 2.1 cm fusiform right common iliac artery aneurysm. Continued surveillance recommended. 4. Aortic Atherosclerosis (ICD10-I70.0) and Emphysema (ICD10-J43.9).   02/23/2017 Pathology Results   Diagnosis 1. Breast, simple mastectomy, Left Total - INVASIVE DUCTAL CARCINOMA, MULTIFOCAL, NOTTINGHAM GRADE 3/3 (5.3 CM, 3.5 CM) - INVASIVE CARCINOMA INVOLVES THE DERMIS - DUCTAL CARCINOMA IN SITU, INTERMEDIATE GRADE - HYALINIZED FIBROADENOMA - MARGINS UNINVOLVED BY CARCINOMA - NO CARCINOMA IDENTIFIED IN TWO LYMPH NODES (0/2) - SEE ONCOLOGY TABLE AND COMMENT BELOW 2. Lymph node, sentinel, biopsy, Left Axillary - NO CARCINOMA IDENTIFIED IN ONE LYMPH NODE (0/1) 3. Lymph node, sentinel, biopsy - NO CARCINOMA IDENTIFIED IN ONE LYMPH NODE (0/1) 4. Lymph node, sentinel, biopsy - NO CARCINOMA IDENTIFIED IN ONE LYMPH NODE (0/1) 5. Lymph node, sentinel, biopsy - NO CARCINOMA IDENTIFIED IN ONE LYMPH NODE (0/1) Microscopic Comment 1. BREAST, INVASIVE TUMOR Procedure: Simple mastectomy Laterality: Left Tumor Size: 5. 3 cm, 3.5 cm Histologic Type: Invasive carcinoma of no special  type (ductal, not otherwise specified) Grade: Nottingham Grade 3 Tubular  Differentiation: 3 Nuclear Pleomorphism: 3 Mitotic Count: 2 Ductal Carcinoma in Situ (DCIS): Present Extent of Tumor: Skin: Invasive carcinoma directly invades into the dermis or epidermis without skin ulceration Margins: Invasive carcinoma, distance from closest margin: 1.5 cm (posterior) DCIS, distance from closest margin: > 1 cm Regional Lymph Nodes: Number of Lymph Nodes Examined: 6 Number of Sentinel Lymph Nodes Examined: 4 Lymph Nodes with Macrometastases: 0 Lymph Nodes with Micrometastases: 0 Lymph Nodes with Isolated Tumor Cells: 0 Treatment effect: No known presurgical therapy Breast Prognostic Profile: See Also ([id number]) Estrogen Receptor: Positive (100%, strong); Positive (100%, strong) Progesterone Receptor: Positive (50%, strong); Positive (50%, strong) Her2: Positive (Ratio 2.80); Negative (Ratio 1.28) Ki-67: 70%; 60% Best tumor block for sendout testing: 1E Pathologic Stage Classification (pTNM, AJCC 8th Edition): Primary Tumor: mpT3 Regional Lymph Nodes: pN0 COMMENT: The two invasive carcinomas in the breast have slightly different morphologies. The larger lesion has a papillary and micropapillary pattern admixed with typical ductal carcinoma while the smaller lesion is more typical of a ductal lesion with linear arrays (E-cadherin performed on biopsy). There are foci within the larger lesion that are concerning for lymphovascular space invasion.    04/07/2017 - 04/27/2018 Chemotherapy   weekly taxol and herceptin x12 weeks, 04/07/17-06/23/17. Then herceptin q3 weeks for total 1 year which was switched to [place] on 07/14/17. Stopped after 2 doses due to worsening neuropathy. Switched back to maintenance Herceptin started 8/12019. Completed on 04/27/18.    07/20/2017 - 09/03/2017 Radiation Therapy   She started adjuvant radiation on 07/20/17 with Dr. Lisbeth Renshaw and plans to complete on 09/03/17.     10/2017 -  Anti-estrogen oral therapy   Anastrozole 49m daily starting 10/2017   09/27/2018 Procedure   She had her PAC removed on 09/27/18.      CURRENT THERAPY: Adjuvant anti-estrogen with anastrozole since 10/2017  INTERVAL HISTORY: Ms. EOatleypresents for short-term phone f/up for side effects.  He continues to feel off balance which she attributes to fluctuating degree of neuropathy.  He walks a lot, uses a cane in the walls of her home for balance.  Denies fall.  This is no worse than before.  Her energy is adequate.  She has lost a few more pounds by trying to eat less to lose weight.  Headaches have improved, less frequent and not lasting as long, only a few hours rather than 2-3 days.  She does not require medication.  She does not feel that this is an issue.  Denies any other new specific complaints.  All other systems were reviewed with the patient and are negative.  MEDICAL HISTORY:  Past Medical History:  Diagnosis Date   Arthritis    "maybe a little in my left knee" (02/23/2017)   Asthmatic bronchitis    Cancer of left breast ([facility] 2019   Coronary atherosclerosis    Minor nonobstructive CAD at cardiac catheterization 2009   Dyslipidemia    Femoral bruit 7/08   With normal ABIs   Hypertension    Iliac artery aneurysm ([facility])    Lower extremity edema    Chronic   Pneumonia ~ 1950   in 8th grade   Type II diabetes mellitus ([facility]     SURGICAL HISTORY: Past Surgical History:  Procedure Laterality Date   APPENDECTOMY     BREAST BIOPSY Left 02/2017   CARDIAC CATHETERIZATION     "long time ago" (02/23/2017)   MASTECTOMY COMPLETE / SIMPLE W/ SENTINEL NODE BIOPSY Left 02/23/2017   TOTAL MATECTOMY;  LEFT AXILLARY SENTINEL LYMPH NODE BIOPSY ERAS PATHWAY   MASTECTOMY WITH RADIOACTIVE SEED GUIDED EXCISION AND AXILLARY SENTINEL LYMPH NODE BIOPSY Left 02/23/2017   Procedure: LEFT TOTAL MATECTOMY;  LEFT AXILLARY SENTINEL LYMPH NODE BIOPSY ERAS PATHWAY;  Surgeon: Excell Seltzer,  MD;  Location: [facility];  Service: General;  Laterality: Left;   PACEMAKER IMPLANT N/A 10/10/2020   Procedure: PACEMAKER IMPLANT;  Surgeon: Evans Lance, MD;  Location: [facility];  Service: Cardiovascular;  Laterality: N/A;   PILONIDAL CYST EXCISION     PORT-A-CATH REMOVAL Right 09/27/2018   Procedure: REMOVAL PORT-A-CATH;  Surgeon: Fanny Skates, MD;  Location: [facility];  Service: General;  Laterality: Right;   PORTA CATH INSERTION  02/23/2017   PORTACATH PLACEMENT N/A 02/23/2017   Procedure: INSERTION PORT-A-CATH;  Surgeon: Excell Seltzer, MD;  Location: [facility];  Service: General;  Laterality: N/A;   SHOULDER ARTHROSCOPY W/ ROTATOR CUFF REPAIR Right     I have reviewed the social history and family history with the patient and they are unchanged from previous note.  ALLERGIES:  has No Known Allergies.  MEDICATIONS:  Current Outpatient Medications  Medication Sig Dispense Refill   albuterol (VENTOLIN HFA) 108 (90 Base) MCG/ACT inhaler Inhale 1 puff into the lungs every 6 (six) hours as needed for wheezing or shortness of breath.     alendronate (FOSAMAX) 70 MG tablet Take 70 mg by mouth once a week.     amLODipine (NORVASC) 5 MG tablet Take 1 tablet (5 mg total) by mouth daily. 90 tablet 3   anastrozole (ARIMIDEX) 1 MG tablet Take 1 tablet (1 mg total) by mouth daily. 90 tablet 3   aspirin 81 MG EC tablet Take 81 mg by mouth at bedtime.     chlorthalidone (HYGROTON) 25 MG tablet Take 25 mg by mouth daily.     Coenzyme Q10 (COQ10) 100 MG CAPS Take 100 mg by mouth daily.     cyclobenzaprine (FLEXERIL) 10 MG tablet Take 5-10 mg by mouth at bedtime as needed. As directed     latanoprost (XALATAN) 0.005 % ophthalmic solution Place 1 drop into both eyes at bedtime.     losartan (COZAAR) 100 MG tablet Take 100 mg by mouth daily.     metFORMIN (GLUCOPHAGE) 500 MG tablet Take 1,000 mg by mouth daily.     pregabalin (LYRICA) 150 MG capsule TAKE ONE CAPSULE BY MOUTH  TWICE A DAY 60 capsule 2   rosuvastatin (CRESTOR) 5 MG tablet Take 5 mg by mouth every other day.     vitamin B-12 (CYANOCOBALAMIN) 1000 MCG tablet Take 1,000 mcg by mouth daily.     Vitamin D, Cholecalciferol, 25 MCG (1000 UT) CAPS Take 25 mcg by mouth daily.     No current facility-administered medications for this visit.    PHYSICAL EXAMINATION:  There were no vitals filed for this visit. There were no vitals filed for this visit.  Patient appears well over the phone.  Voice is strong, speech is clear.  Mood/affect appears normal.  No cough or conversational dyspnea.  LABORATORY DATA:  I have reviewed the data as listed    Latest Ref Rng & Units 10/08/2021    9:56 AM 04/07/2021    1:43 PM 10/10/2020    1:27 AM  CBC  WBC 4.0 - 10.5 K/uL 3.3  3.6  4.3   Hemoglobin 12.0 - 15.0 g/dL 11.9  12.5  11.8   Hematocrit 36.0 - 46.0 % 36.3  38.5  36.0   Platelets 150 - 400 K/uL 232  224  183         Latest Ref Rng & Units 10/08/2021    9:56 AM 04/07/2021    1:43 PM 10/10/2020    1:27 AM  CMP  Glucose 70 - 99 mg/dL 175  113  123   BUN 8 - 23 mg/dL _0 Creatinine 0.44 - 1.00 mg/dL 0.74  0.84  0.66   Sodium 135 - 145 mmol/L 133  136  137   Potassium 3.5 - 5.1 mmol/L 4.0  3.3  3.6   Chloride 98 - 111 mmol/L 97  99  103   CO2 22 - 32 mmol/L 32  29  26   Calcium 8.9 - 10.3 mg/dL 9.7  10.2  9.1   Total Protein 6.5 - 8.1 g/dL 7.1  7.4    Total Bilirubin 0.3 - 1.2 mg/dL 0.8  1.0    Alkaline Phos 38 - 126 U/L 44  47    AST 15 - 41 U/L 18  19    ALT 0 - 44 U/L 12  13        RADIOGRAPHIC STUDIES: I have personally reviewed the radiological images as listed and agreed with the findings in the report. No results found.   ASSESSMENT & PLAN: Semaya Vida is an 88 y.o. female    1.  Balance issue, headaches, weight loss 2.  Multifocal invasive ductal carcinoma of the left breast of female; carcinoma of the upper-outer quadrant of left breast and female, ER/PR positive, HER-2 negative  pT2N0M0, G2, Stage 1B; IDC of the upper-inner quadrant left breast, ER+/PR+/HER2+, pT3N0M0 G3, stage IB 3. Anemia and neutropenia 4. 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 HTN, CAD, DM, bradycardia and third-degree AV block 6.  Osteopenia  Disposition: Ms. Bonneville appears well by phone.  Headaches have improved.  Mild weight loss is intentional.  And altered balance remained stable which is likely secondary to neuropathy.  Safety measures are in place, no fall.  No other issues.  I do not feel strongly she needs further workup for the headaches or other issues.  She will monitor and let me know if things change before her next visit on 04/09/2022.   I discussed the assessment and treatment plan with the patient. The patient was provided an opportunity to ask questions and all were answered. The patient agreed with the plan and demonstrated an understanding of the instructions.   The patient was advised to call back or seek an in-person evaluation if the symptoms worsen or if the condition fails to improve as anticipated. The total time spent in the appointment was 10 minutes and more than 50% was on counseling and chart review.     Alla Feeling, NP 01/07/22

## 2022-01-07 ENCOUNTER — Inpatient Hospital Stay: Payer: Medicare Other | Attending: Nurse Practitioner | Admitting: Nurse Practitioner

## 2022-01-07 ENCOUNTER — Encounter: Payer: Self-pay | Admitting: Nurse Practitioner

## 2022-01-07 DIAGNOSIS — Z170 Estrogen receptor positive status [ER+]: Secondary | ICD-10-CM | POA: Diagnosis not present

## 2022-01-07 DIAGNOSIS — C50412 Malignant neoplasm of upper-outer quadrant of left female breast: Secondary | ICD-10-CM

## 2022-01-07 DIAGNOSIS — C50212 Malignant neoplasm of upper-inner quadrant of left female breast: Secondary | ICD-10-CM

## 2022-01-22 ENCOUNTER — Ambulatory Visit (INDEPENDENT_AMBULATORY_CARE_PROVIDER_SITE_OTHER): Payer: Medicare Other

## 2022-01-22 DIAGNOSIS — I442 Atrioventricular block, complete: Secondary | ICD-10-CM | POA: Diagnosis not present

## 2022-01-22 LAB — CUP PACEART REMOTE DEVICE CHECK
Battery Remaining Longevity: 94 mo
Battery Remaining Percentage: 88 %
Battery Voltage: 3.01 V
Brady Statistic AP VP Percent: 24 %
Brady Statistic AP VS Percent: 1 %
Brady Statistic AS VP Percent: 76 %
Brady Statistic AS VS Percent: 1 %
Brady Statistic RA Percent Paced: 24 %
Brady Statistic RV Percent Paced: 99 %
Date Time Interrogation Session: 20231221020013
Implantable Lead Connection Status: 753985
Implantable Lead Connection Status: 753985
Implantable Lead Implant Date: 20220908
Implantable Lead Implant Date: 20220908
Implantable Lead Location: 753859
Implantable Lead Location: 753860
Implantable Pulse Generator Implant Date: 20220908
Lead Channel Impedance Value: 490 Ohm
Lead Channel Impedance Value: 560 Ohm
Lead Channel Pacing Threshold Amplitude: 0.5 V
Lead Channel Pacing Threshold Amplitude: 0.75 V
Lead Channel Pacing Threshold Pulse Width: 0.5 ms
Lead Channel Pacing Threshold Pulse Width: 0.5 ms
Lead Channel Sensing Intrinsic Amplitude: 12 mV
Lead Channel Sensing Intrinsic Amplitude: 2 mV
Lead Channel Setting Pacing Amplitude: 2 V
Lead Channel Setting Pacing Amplitude: 2.5 V
Lead Channel Setting Pacing Pulse Width: 0.5 ms
Lead Channel Setting Sensing Sensitivity: 2 mV
Pulse Gen Model: 2272
Pulse Gen Serial Number: 6519562

## 2022-02-12 NOTE — Progress Notes (Signed)
Remote pacemaker transmission.   

## 2022-02-26 DIAGNOSIS — Z23 Encounter for immunization: Secondary | ICD-10-CM | POA: Diagnosis not present

## 2022-04-06 ENCOUNTER — Ambulatory Visit: Payer: Medicare Other

## 2022-04-08 ENCOUNTER — Ambulatory Visit
Admission: RE | Admit: 2022-04-08 | Discharge: 2022-04-08 | Disposition: A | Discharge status: Home / Self Care | Payer: Medicare Other | Source: Ambulatory Visit | Attending: Nurse Practitioner | Admitting: Nurse Practitioner

## 2022-04-08 ENCOUNTER — Ambulatory Visit: Payer: Medicare Other

## 2022-04-08 DIAGNOSIS — Z1231 Encounter for screening mammogram for malignant neoplasm of breast: Secondary | ICD-10-CM | POA: Diagnosis not present

## 2022-04-08 NOTE — Progress Notes (Unsigned)
Patient Care Team: Sasser, Silvestre Moment, MD as PCP - General (Cardiology) [name], MD (Inactive) as PCP - Cardiology (Cardiology) Truitt Merle, MD as Consulting Physician (Hematology) Excell Seltzer, MD (Inactive) as Consulting Physician (General Surgery) Alla Feeling, NP as Nurse Practitioner (Nurse Practitioner) [name], MD (Inactive) as Attending Physician (Cardiology)   CHIEF COMPLAINT: Follow-up left breast cancer  Oncology History Overview Note  Cancer Staging Carcinoma of upper-inner quadrant of left breast in female, estrogen receptor positive ([place]) Staging form: Breast, AJCC 8th Edition - Clinical stage from 01/18/2017: Stage IB (cT2, cN0, cM0, G3, ER: Positive, PR: Positive, HER2: Positive) - Signed by Truitt Merle, MD on 02/10/2017 - Pathologic stage from 02/23/2017: Stage IB (pT3, pN0, cM0, G3, ER: Positive, PR: Positive, HER2: Positive) - Signed by Alla Feeling, NP on 03/16/2017  Carcinoma of upper-outer quadrant of left breast in female, estrogen receptor positive ([place]) Staging form: Breast, AJCC 8th Edition - Clinical stage from 01/18/2017: Stage IB (cT2, cN0, cM0, G2, ER: Positive, PR: Positive, HER2: Negative) - Unsigned - Pathologic stage from 02/23/2017: Stage IB (pT2, pN0(sn), cM0, G3, ER: Positive, PR: Positive, HER2: Negative) - Signed by Alla Feeling, NP on 03/16/2017     Carcinoma of upper-outer quadrant of left breast in female, estrogen receptor positive ([place])  01/13/2017 Mammogram   FINDINGS: In the left breast, a spiculated mass lies in the upper outer quadrant. In the medial posterior left breast, there is a larger lobulated mass. These masses correspond to the palpable abnormalities. No other discrete left breast masses.   In the right breast, there are no discrete masses. There are no areas of architectural distortion.   In both breasts there are scattered calcifications, rounded punctate, with a few other larger coarse  dystrophic calcifications, without significant change from the previous screening mammogram, which is dated 12/04/2009.   Mammographic images were processed with CAD.   On physical exam, there is a firm palpable mass in the upper outer left breast and another firm mass in the medial left breast.     01/13/2017 Breast US   Targeted ultrasound is performed, showing an irregular hypoechoic shadowing mass in the left breast at the 2:30 o'clock position, 5 cm the nipple, measuring 3.3 x 2.8 x 2.6 cm. In the 10:30 o'clock position of the left breast, 5 cm the nipple, there is irregular hypoechoic mass with somewhat more lobulated margins, corresponding to the lobulated mass seen mammographically. This measures 4.8 x 2.9 x 4.8 cm. Both masses show internal vascularity on color Doppler analysis.   Sonographic evaluation of the left axilla shows several nodes with thickened cortices. Status cortex measured is 5 mm. None of these lymph nodes appear enlarged but overall size criteria.   IMPRESSION: 1. Two masses in the left breast, 1 at the 2:30 o'clock position in the other at the 10:30 o'clock position, both highly suspicious for breast carcinoma. 2. Several abnormal left axillary lymph nodes with thickened cortices. 3. No evidence of right breast malignancy.     01/18/2017 Pathology Results   Diagnosis 1. Breast, left, needle core biopsy, 2:30 o'clock - INVASIVE MAMMARY CARCINOMA - MAMMARY CARCINOMA IN-SITU - SEE COMMENT 2. Lymph node, needle/core biopsy, left axilla - NO CARCINOMA IDENTIFIED - SEE COMMENT 3. Breast, left, needle core biopsy, 10:30 o'clock - INVASIVE DUCTAL CARCINOMA - SEE COMMENT  1. PROGNOSTIC INDICATORS Results: IMMUNOHISTOCHEMICAL AND MORPHOMETRIC ANALYSIS PERFORMED MANUALLY Estrogen Receptor: 100%, POSITIVE, STRONG STAINING INTENSITY Progesterone Receptor: 60%, POSITIVE, STRONG STAINING  INTENSITY Proliferation Marker Ki67: 60%  1. FLUORESCENCE  IN-SITU HYBRIDIZATION Results: HER2 - NEGATIVE RATIO OF HER2/CEP17 SIGNALS 1.28 AVERAGE HER2 COPY NUMBER PER CELL 2.75  3. PROGNOSTIC INDICATORS Results: IMMUNOHISTOCHEMICAL AND MORPHOMETRIC ANALYSIS PERFORMED MANUALLY Estrogen Receptor: 100%, POSITIVE, STRONG STAINING INTENSITY Progesterone Receptor: 50%, POSITIVE, STRONG STAINING INTENSITY Proliferation Marker Ki67: 70%  3. FLUORESCENCE IN-SITU HYBRIDIZATION Results: HER2 - **POSITIVE** RATIO OF HER2/CEP17 SIGNALS 2.80 AVERAGE HER2 COPY NUMBER PER CELL 6.15  Microscopic Comment 1. The biopsy material shows an infiltrative proliferation of cells with large vesicular nuclei with inconspicuous nucleoli, arranged linearly and in small clusters. Based on the biopsy, the carcinoma appears Nottingham grade 2 of 3 and measures 0.9 cm in greatest linear extent. E-cadherin and prognostic markers (ER/PR/ki-67/HER2-FISH)are pending and will be reported in an addendum. Dr. Lyndon Code reviewed the case and agrees with the above diagnosis.   02/10/2017 Initial Diagnosis   Carcinoma of upper-outer quadrant of left breast in female, estrogen receptor positive ([place])   02/19/2017 Imaging   Bone Scan whole Body 02/19/17 IMPRESSION: Negative for evidence of bony metastatic disease.   02/19/2017 Imaging   CT CAP W Contrast 02/19/17 IMPRESSION: 1. Small right middle lobe pulmonary nodules up to 0.8 cm, nonspecific. Non-contrast chest CT at 3-6 months is recommended. If the nodules are stable at time of repeat CT, then future CT at 18-24 months (from today's scan) is considered optional for low-risk patients, but is recommended for high-risk patients. This recommendation follows the consensus statement: Guidelines for Management of Incidental Pulmonary Nodules Detected on CT Images: From the Fleischner Society 2017; Radiology 2017; 284:228-243. 2. Subcentimeter hepatic lesions likely cysts but incompletely characterized due to small size. Attention on  follow-up imaging recommended. 3. 2.1 cm fusiform right common iliac artery aneurysm. Continued surveillance recommended. 4. Aortic Atherosclerosis (ICD10-I70.0) and Emphysema (ICD10-J43.9).   02/23/2017 Pathology Results   Diagnosis 1. Breast, simple mastectomy, Left Total - INVASIVE DUCTAL CARCINOMA, MULTIFOCAL, NOTTINGHAM GRADE 3/3 (5.3 CM, 3.5 CM) - INVASIVE CARCINOMA INVOLVES THE DERMIS - DUCTAL CARCINOMA IN SITU, INTERMEDIATE GRADE - HYALINIZED FIBROADENOMA - MARGINS UNINVOLVED BY CARCINOMA - NO CARCINOMA IDENTIFIED IN TWO LYMPH NODES (0/2) - SEE ONCOLOGY TABLE AND COMMENT BELOW 2. Lymph node, sentinel, biopsy, Left Axillary - NO CARCINOMA IDENTIFIED IN ONE LYMPH NODE (0/1) 3. Lymph node, sentinel, biopsy - NO CARCINOMA IDENTIFIED IN ONE LYMPH NODE (0/1) 4. Lymph node, sentinel, biopsy - NO CARCINOMA IDENTIFIED IN ONE LYMPH NODE (0/1) 5. Lymph node, sentinel, biopsy - NO CARCINOMA IDENTIFIED IN ONE LYMPH NODE (0/1) Microscopic Comment 1. BREAST, INVASIVE TUMOR Procedure: Simple mastectomy Laterality: Left Tumor Size: 5. 3 cm, 3.5 cm Histologic Type: Invasive carcinoma of no special type (ductal, not otherwise specified) Grade: Nottingham Grade 3 Tubular Differentiation: 3 Nuclear Pleomorphism: 3 Mitotic Count: 2 Ductal Carcinoma in Situ (DCIS): Present Extent of Tumor: Skin: Invasive carcinoma directly invades into the dermis or epidermis without skin ulceration Margins: Invasive carcinoma, distance from closest margin: 1.5 cm (posterior) DCIS, distance from closest margin: > 1 cm Regional Lymph Nodes: Number of Lymph Nodes Examined: 6 Number of Sentinel Lymph Nodes Examined: 4 Lymph Nodes with Macrometastases: 0 Lymph Nodes with Micrometastases: 0 Lymph Nodes with Isolated Tumor Cells: 0 Treatment effect: No known presurgical therapy Breast Prognostic Profile: See Also [id number]) Estrogen Receptor: Positive (100%, strong); Positive (100%,  strong) Progesterone Receptor: Positive (50%, strong); Positive (50%, strong) Her2: Positive (Ratio 2.80); Negative (Ratio 1.28) Ki-67: 70%; 60% Best tumor block for sendout testing: 1E Pathologic Stage Classification (pTNM, AJCC  8th Edition): Primary Tumor: mpT3 Regional Lymph Nodes: pN0 COMMENT: The two invasive carcinomas in the breast have slightly different morphologies. The larger lesion has a papillary and micropapillary pattern admixed with typical ductal carcinoma while the smaller lesion is more typical of a ductal lesion with linear arrays (E-cadherin performed on biopsy). There are foci within the larger lesion that are concerning for lymphovascular space invasion.    04/07/2017 - 04/27/2018 Chemotherapy   weekly taxol and herceptin x12 weeks, 04/07/17-06/23/17. Then herceptin q3 weeks for total 1 year which was switched to [place] on 07/14/17. Stopped after 2 doses due to worsening neuropathy. Switched back to maintenance Herceptin started 8/12019. Completed on 04/27/18.    07/20/2017 - 09/03/2017 Radiation Therapy   She started adjuvant radiation on 07/20/17 with Dr. Lisbeth Renshaw and plans to complete on 09/03/17.    10/2017 -  Anti-estrogen oral therapy   Anastrozole '1mg'$  daily starting 10/2017   09/27/2018 Procedure   She had her PAC removed on 09/27/18.       CURRENT THERAPY: Anastrozole 1 mg daily, starting 10/2017   INTERVAL HISTORY Penny Huynh returns for follow-up as scheduled, last seen by me in person 10/08/2021, and phone visit in 01/2022 to follow-up balance issue, headache, and weight loss which she felt was improving at the time.  Mammogram yesterday  Continues anastrozole  ROS   Past Medical History:  Diagnosis Date   Arthritis    "maybe a little in my left knee" (02/23/2017)   Asthmatic bronchitis    Cancer of left breast ([place]) 2019   Coronary atherosclerosis    Minor nonobstructive CAD at cardiac catheterization 2009   Dyslipidemia    Femoral bruit 7/08   With  normal ABIs   Hypertension    Iliac artery aneurysm ([facility])    Lower extremity edema    Chronic   Pneumonia ~ 1950   in 8th grade   Type II diabetes mellitus ([place])      Past Surgical History:  Procedure Laterality Date   APPENDECTOMY     BREAST BIOPSY Left 02/2017   CARDIAC CATHETERIZATION     "long time ago" (02/23/2017)   MASTECTOMY COMPLETE / SIMPLE W/ SENTINEL NODE BIOPSY Left 02/23/2017   TOTAL MATECTOMY;  LEFT AXILLARY SENTINEL LYMPH NODE BIOPSY ERAS PATHWAY   MASTECTOMY WITH RADIOACTIVE SEED GUIDED EXCISION AND AXILLARY SENTINEL LYMPH NODE BIOPSY Left 02/23/2017   Procedure: LEFT TOTAL MATECTOMY;  LEFT AXILLARY SENTINEL LYMPH NODE BIOPSY ERAS PATHWAY;  Surgeon: Excell Seltzer, MD;  Location: [facility];  Service: General;  Laterality: Left;   PACEMAKER IMPLANT N/A 10/10/2020   Procedure: PACEMAKER IMPLANT;  Surgeon: Evans Lance, MD;  Location: [facility];  Service: Cardiovascular;  Laterality: N/A;   PILONIDAL CYST EXCISION     PORT-A-CATH REMOVAL Right 09/27/2018   Procedure: REMOVAL PORT-A-CATH;  Surgeon: Fanny Skates, MD;  Location: [facility];  Service: General;  Laterality: Right;   PORTA CATH INSERTION  02/23/2017   PORTACATH PLACEMENT N/A 02/23/2017   Procedure: INSERTION PORT-A-CATH;  Surgeon: Excell Seltzer, MD;  Location: [facility];  Service: General;  Laterality: N/A;   SHOULDER ARTHROSCOPY W/ ROTATOR CUFF REPAIR Right      Outpatient Encounter Medications as of 04/09/2022  Medication Sig Note   albuterol (VENTOLIN HFA) 108 (90 Base) MCG/ACT inhaler Inhale 1 puff into the lungs every 6 (six) hours as needed for wheezing or shortness of breath.    alendronate (FOSAMAX) 70 MG tablet Take 70 mg by  mouth once a week. 10/10/2020: Take on Mondays    amLODipine (NORVASC) 5 MG tablet Take 1 tablet (5 mg total) by mouth daily.    anastrozole (ARIMIDEX) 1 MG tablet Take 1 tablet (1 mg total) by mouth daily.    aspirin 81 MG EC tablet Take 81 mg by mouth  at bedtime.    chlorthalidone (HYGROTON) 25 MG tablet Take 25 mg by mouth daily.    Coenzyme Q10 (COQ10) 100 MG CAPS Take 100 mg by mouth daily.    cyclobenzaprine (FLEXERIL) 10 MG tablet Take 5-10 mg by mouth at bedtime as needed. As directed    latanoprost (XALATAN) 0.005 % ophthalmic solution Place 1 drop into both eyes at bedtime.    losartan (COZAAR) 100 MG tablet Take 100 mg by mouth daily.    metFORMIN (GLUCOPHAGE) 500 MG tablet Take 1,000 mg by mouth daily.    pregabalin (LYRICA) 150 MG capsule TAKE ONE CAPSULE BY MOUTH TWICE A DAY    rosuvastatin (CRESTOR) 5 MG tablet Take 5 mg by mouth every other day.    vitamin B-12 (CYANOCOBALAMIN) 1000 MCG tablet Take 1,000 mcg by mouth daily.    Vitamin D, Cholecalciferol, 25 MCG (1000 UT) CAPS Take 25 mcg by mouth daily.    No facility-administered encounter medications on file as of 04/09/2022.     There were no vitals filed for this visit. There is no height or weight on file to calculate BMI.   PHYSICAL EXAM GENERAL:alert, no distress and comfortable SKIN: no rash  EYES: sclera clear NECK: without mass LYMPH:  no palpable cervical or supraclavicular lymphadenopathy  LUNGS: clear with normal breathing effort HEART: regular rate & rhythm, no lower extremity edema ABDOMEN: abdomen soft, non-tender and normal bowel sounds NEURO: alert & oriented x 3 with fluent speech, no focal motor/sensory deficits Breast exam:  PAC without erythema    CBC    Component Value Date/Time   WBC 3.3 (L) 10/08/2021 0956   WBC 4.3 10/10/2020 0127   RBC 4.03 10/08/2021 0956   HGB 11.9 (L) 10/08/2021 0956   HCT 36.3 10/08/2021 0956   PLT 232 10/08/2021 0956   MCV 90.1 10/08/2021 0956   MCH 29.5 10/08/2021 0956   MCHC 32.8 10/08/2021 0956   RDW 13.2 10/08/2021 0956   LYMPHSABS 1.3 10/08/2021 0956   MONOABS 0.3 10/08/2021 0956   EOSABS 0.1 10/08/2021 0956   BASOSABS 0.0 10/08/2021 0956     CMP     Component Value Date/Time   NA 133 (L)  10/08/2021 0956   K 4.0 10/08/2021 0956   CL 97 (L) 10/08/2021 0956   CO2 32 10/08/2021 0956   GLUCOSE 175 (H) 10/08/2021 0956   BUN 10 10/08/2021 0956   CREATININE 0.74 10/08/2021 0956   CALCIUM 9.7 10/08/2021 0956   PROT 7.1 10/08/2021 0956   ALBUMIN 4.2 10/08/2021 0956   AST 18 10/08/2021 0956   ALT 12 10/08/2021 0956   ALKPHOS 44 10/08/2021 0956   BILITOT 0.8 10/08/2021 0956   GFRNONAA >60 10/08/2021 0956   GFRAA >60 10/06/2019 1014     ASSESSMENT & PLAN:  PLAN:  No orders of the defined types were placed in this encounter.     All questions were answered. The patient knows to call the clinic with any problems, questions or concerns. No barriers to learning were detected. I spent *** counseling the patient face to face. The total time spent in the appointment was *** and more than 50% was on  counseling, review of test results, and coordination of care.   Cira Rue, NP-C '@DATE'$ @

## 2022-04-09 ENCOUNTER — Inpatient Hospital Stay (HOSPITAL_BASED_OUTPATIENT_CLINIC_OR_DEPARTMENT_OTHER): Payer: Medicare Other | Admitting: Nurse Practitioner

## 2022-04-09 ENCOUNTER — Other Ambulatory Visit: Payer: Self-pay

## 2022-04-09 ENCOUNTER — Inpatient Hospital Stay: Payer: Medicare Other | Attending: Nurse Practitioner

## 2022-04-09 ENCOUNTER — Encounter: Payer: Self-pay | Admitting: Nurse Practitioner

## 2022-04-09 VITALS — BP 144/67 | HR 69 | Temp 98.4°F | Resp 16 | Wt 163.6 lb

## 2022-04-09 DIAGNOSIS — Z170 Estrogen receptor positive status [ER+]: Secondary | ICD-10-CM

## 2022-04-09 DIAGNOSIS — C50412 Malignant neoplasm of upper-outer quadrant of left female breast: Secondary | ICD-10-CM

## 2022-04-09 DIAGNOSIS — D649 Anemia, unspecified: Secondary | ICD-10-CM | POA: Diagnosis not present

## 2022-04-09 DIAGNOSIS — C50212 Malignant neoplasm of upper-inner quadrant of left female breast: Secondary | ICD-10-CM | POA: Diagnosis not present

## 2022-04-09 DIAGNOSIS — E2839 Other primary ovarian failure: Secondary | ICD-10-CM | POA: Diagnosis not present

## 2022-04-09 DIAGNOSIS — D701 Agranulocytosis secondary to cancer chemotherapy: Secondary | ICD-10-CM | POA: Diagnosis not present

## 2022-04-09 DIAGNOSIS — Z79811 Long term (current) use of aromatase inhibitors: Secondary | ICD-10-CM | POA: Diagnosis not present

## 2022-04-09 LAB — CBC WITH DIFFERENTIAL (CANCER CENTER ONLY)
Abs Immature Granulocytes: 0 10*3/uL (ref 0.00–0.07)
Basophils Absolute: 0 10*3/uL (ref 0.0–0.1)
Basophils Relative: 1 %
Eosinophils Absolute: 0.1 10*3/uL (ref 0.0–0.5)
Eosinophils Relative: 2 %
HCT: 36.7 % (ref 36.0–46.0)
Hemoglobin: 12 g/dL (ref 12.0–15.0)
Immature Granulocytes: 0 %
Lymphocytes Relative: 33 %
Lymphs Abs: 1.4 10*3/uL (ref 0.7–4.0)
MCH: 29.9 pg (ref 26.0–34.0)
MCHC: 32.7 g/dL (ref 30.0–36.0)
MCV: 91.3 fL (ref 80.0–100.0)
Monocytes Absolute: 0.4 10*3/uL (ref 0.1–1.0)
Monocytes Relative: 9 %
Neutro Abs: 2.3 10*3/uL (ref 1.7–7.7)
Neutrophils Relative %: 55 %
Platelet Count: 235 10*3/uL (ref 150–400)
RBC: 4.02 MIL/uL (ref 3.87–5.11)
RDW: 13.2 % (ref 11.5–15.5)
WBC Count: 4.2 10*3/uL (ref 4.0–10.5)
nRBC: 0 % (ref 0.0–0.2)

## 2022-04-09 LAB — CMP (CANCER CENTER ONLY)
ALT: 12 U/L (ref 0–44)
AST: 17 U/L (ref 15–41)
Albumin: 4.2 g/dL (ref 3.5–5.0)
Alkaline Phosphatase: 39 U/L (ref 38–126)
Anion gap: 7 (ref 5–15)
BUN: 17 mg/dL (ref 8–23)
CO2: 31 mmol/L (ref 22–32)
Calcium: 9.5 mg/dL (ref 8.9–10.3)
Chloride: 97 mmol/L — ABNORMAL LOW (ref 98–111)
Creatinine: 0.82 mg/dL (ref 0.44–1.00)
GFR, Estimated: >60 mL/min (ref >60)
Glucose, Bld: 147 mg/dL — ABNORMAL HIGH (ref 70–99)
Potassium: 3.5 mmol/L (ref 3.5–5.1)
Sodium: 135 mmol/L (ref 135–145)
Total Bilirubin: 0.8 mg/dL (ref 0.3–1.2)
Total Protein: 7 g/dL (ref 6.5–8.1)

## 2022-04-23 ENCOUNTER — Ambulatory Visit (INDEPENDENT_AMBULATORY_CARE_PROVIDER_SITE_OTHER): Payer: Medicare Other

## 2022-04-23 DIAGNOSIS — I442 Atrioventricular block, complete: Secondary | ICD-10-CM | POA: Diagnosis not present

## 2022-04-23 LAB — CUP PACEART REMOTE DEVICE CHECK
Battery Remaining Longevity: 91 mo
Battery Remaining Percentage: 86 %
Battery Voltage: 3.01 V
Brady Statistic AP VP Percent: 24 %
Brady Statistic AP VS Percent: 1 %
Brady Statistic AS VP Percent: 76 %
Brady Statistic AS VS Percent: 1 %
Brady Statistic RA Percent Paced: 24 %
Brady Statistic RV Percent Paced: 99 %
Date Time Interrogation Session: 20240321020013
Implantable Lead Connection Status: 753985
Implantable Lead Connection Status: 753985
Implantable Lead Implant Date: 20220908
Implantable Lead Implant Date: 20220908
Implantable Lead Location: 753859
Implantable Lead Location: 753860
Implantable Pulse Generator Implant Date: 20220908
Lead Channel Impedance Value: 530 Ohm
Lead Channel Impedance Value: 560 Ohm
Lead Channel Pacing Threshold Amplitude: 0.5 V
Lead Channel Pacing Threshold Amplitude: 0.75 V
Lead Channel Pacing Threshold Pulse Width: 0.5 ms
Lead Channel Pacing Threshold Pulse Width: 0.5 ms
Lead Channel Sensing Intrinsic Amplitude: 0.8 mV
Lead Channel Sensing Intrinsic Amplitude: 12 mV
Lead Channel Setting Pacing Amplitude: 2 V
Lead Channel Setting Pacing Amplitude: 2.5 V
Lead Channel Setting Pacing Pulse Width: 0.5 ms
Lead Channel Setting Sensing Sensitivity: 2 mV
Pulse Gen Model: 2272
Pulse Gen Serial Number: 6519562

## 2022-05-05 ENCOUNTER — Ambulatory Visit: Payer: Medicare Other | Admitting: Nutrition

## 2022-05-27 NOTE — Progress Notes (Signed)
Remote pacemaker transmission.   

## 2022-06-02 DIAGNOSIS — Z6829 Body mass index (BMI) 29.0-29.9, adult: Secondary | ICD-10-CM | POA: Diagnosis not present

## 2022-06-02 DIAGNOSIS — M169 Osteoarthritis of hip, unspecified: Secondary | ICD-10-CM | POA: Diagnosis not present

## 2022-06-02 DIAGNOSIS — M25559 Pain in unspecified hip: Secondary | ICD-10-CM | POA: Diagnosis not present

## 2022-06-02 DIAGNOSIS — R030 Elevated blood-pressure reading, without diagnosis of hypertension: Secondary | ICD-10-CM | POA: Diagnosis not present

## 2022-07-02 DIAGNOSIS — H04123 Dry eye syndrome of bilateral lacrimal glands: Secondary | ICD-10-CM | POA: Diagnosis not present

## 2022-07-02 DIAGNOSIS — H43811 Vitreous degeneration, right eye: Secondary | ICD-10-CM | POA: Diagnosis not present

## 2022-07-02 DIAGNOSIS — E119 Type 2 diabetes mellitus without complications: Secondary | ICD-10-CM | POA: Diagnosis not present

## 2022-07-02 DIAGNOSIS — H401131 Primary open-angle glaucoma, bilateral, mild stage: Secondary | ICD-10-CM | POA: Diagnosis not present

## 2022-07-02 DIAGNOSIS — Z961 Presence of intraocular lens: Secondary | ICD-10-CM | POA: Diagnosis not present

## 2022-07-07 DIAGNOSIS — E7849 Other hyperlipidemia: Secondary | ICD-10-CM | POA: Diagnosis not present

## 2022-07-07 DIAGNOSIS — M160 Bilateral primary osteoarthritis of hip: Secondary | ICD-10-CM | POA: Diagnosis not present

## 2022-07-07 DIAGNOSIS — E1165 Type 2 diabetes mellitus with hyperglycemia: Secondary | ICD-10-CM | POA: Diagnosis not present

## 2022-07-07 DIAGNOSIS — R030 Elevated blood-pressure reading, without diagnosis of hypertension: Secondary | ICD-10-CM | POA: Diagnosis not present

## 2022-07-07 DIAGNOSIS — E782 Mixed hyperlipidemia: Secondary | ICD-10-CM | POA: Diagnosis not present

## 2022-07-07 DIAGNOSIS — M25552 Pain in left hip: Secondary | ICD-10-CM | POA: Diagnosis not present

## 2022-07-07 DIAGNOSIS — Z6828 Body mass index (BMI) 28.0-28.9, adult: Secondary | ICD-10-CM | POA: Diagnosis not present

## 2022-07-07 DIAGNOSIS — I10 Essential (primary) hypertension: Secondary | ICD-10-CM | POA: Diagnosis not present

## 2022-07-21 DIAGNOSIS — M542 Cervicalgia: Secondary | ICD-10-CM | POA: Diagnosis not present

## 2022-07-21 DIAGNOSIS — C50919 Malignant neoplasm of unspecified site of unspecified female breast: Secondary | ICD-10-CM | POA: Diagnosis not present

## 2022-07-21 DIAGNOSIS — I10 Essential (primary) hypertension: Secondary | ICD-10-CM | POA: Diagnosis not present

## 2022-07-21 DIAGNOSIS — K5900 Constipation, unspecified: Secondary | ICD-10-CM | POA: Diagnosis not present

## 2022-07-21 DIAGNOSIS — I700 Atherosclerosis of aorta: Secondary | ICD-10-CM | POA: Diagnosis not present

## 2022-07-21 DIAGNOSIS — E871 Hypo-osmolality and hyponatremia: Secondary | ICD-10-CM | POA: Diagnosis not present

## 2022-07-21 DIAGNOSIS — M109 Gout, unspecified: Secondary | ICD-10-CM | POA: Diagnosis not present

## 2022-07-21 DIAGNOSIS — E1140 Type 2 diabetes mellitus with diabetic neuropathy, unspecified: Secondary | ICD-10-CM | POA: Diagnosis not present

## 2022-07-21 DIAGNOSIS — E7849 Other hyperlipidemia: Secondary | ICD-10-CM | POA: Diagnosis not present

## 2022-07-21 DIAGNOSIS — E7801 Familial hypercholesterolemia: Secondary | ICD-10-CM | POA: Diagnosis not present

## 2022-07-21 DIAGNOSIS — E1165 Type 2 diabetes mellitus with hyperglycemia: Secondary | ICD-10-CM | POA: Diagnosis not present

## 2022-07-21 DIAGNOSIS — R252 Cramp and spasm: Secondary | ICD-10-CM | POA: Diagnosis not present

## 2022-07-23 ENCOUNTER — Ambulatory Visit: Payer: Medicare Other

## 2022-07-23 DIAGNOSIS — I442 Atrioventricular block, complete: Secondary | ICD-10-CM | POA: Diagnosis not present

## 2022-07-23 LAB — CUP PACEART REMOTE DEVICE CHECK
Battery Remaining Longevity: 88 mo
Battery Remaining Percentage: 83 %
Battery Voltage: 3.01 V
Brady Statistic AP VP Percent: 25 %
Brady Statistic AP VS Percent: 1 %
Brady Statistic AS VP Percent: 75 %
Brady Statistic AS VS Percent: 1 %
Brady Statistic RA Percent Paced: 24 %
Brady Statistic RV Percent Paced: 99 %
Date Time Interrogation Session: 20240620020012
Implantable Lead Connection Status: 753985
Implantable Lead Connection Status: 753985
Implantable Lead Implant Date: 20220908
Implantable Lead Implant Date: 20220908
Implantable Lead Location: 753859
Implantable Lead Location: 753860
Implantable Pulse Generator Implant Date: 20220908
Lead Channel Impedance Value: 490 Ohm
Lead Channel Impedance Value: 540 Ohm
Lead Channel Pacing Threshold Amplitude: 0.5 V
Lead Channel Pacing Threshold Amplitude: 0.75 V
Lead Channel Pacing Threshold Pulse Width: 0.5 ms
Lead Channel Pacing Threshold Pulse Width: 0.5 ms
Lead Channel Sensing Intrinsic Amplitude: 0.8 mV
Lead Channel Sensing Intrinsic Amplitude: 12 mV
Lead Channel Setting Pacing Amplitude: 2 V
Lead Channel Setting Pacing Amplitude: 2.5 V
Lead Channel Setting Pacing Pulse Width: 0.5 ms
Lead Channel Setting Sensing Sensitivity: 2 mV
Pulse Gen Model: 2272
Pulse Gen Serial Number: 6519562

## 2022-07-27 DIAGNOSIS — M1612 Unilateral primary osteoarthritis, left hip: Secondary | ICD-10-CM | POA: Diagnosis not present

## 2022-07-27 DIAGNOSIS — M7062 Trochanteric bursitis, left hip: Secondary | ICD-10-CM | POA: Diagnosis not present

## 2022-07-27 DIAGNOSIS — M5136 Other intervertebral disc degeneration, lumbar region: Secondary | ICD-10-CM | POA: Diagnosis not present

## 2022-08-12 NOTE — Progress Notes (Signed)
Remote pacemaker transmission.   

## 2022-08-17 DIAGNOSIS — M48061 Spinal stenosis, lumbar region without neurogenic claudication: Secondary | ICD-10-CM | POA: Diagnosis not present

## 2022-08-18 ENCOUNTER — Other Ambulatory Visit (HOSPITAL_COMMUNITY): Payer: Self-pay | Admitting: Sports Medicine

## 2022-08-18 DIAGNOSIS — M4807 Spinal stenosis, lumbosacral region: Secondary | ICD-10-CM

## 2022-09-02 ENCOUNTER — Ambulatory Visit (HOSPITAL_COMMUNITY)
Admission: RE | Admit: 2022-09-02 | Discharge: 2022-09-02 | Disposition: A | Discharge status: Home / Self Care | Payer: Medicare Other | Source: Ambulatory Visit | Attending: Sports Medicine | Admitting: Sports Medicine

## 2022-09-02 DIAGNOSIS — M5117 Intervertebral disc disorders with radiculopathy, lumbosacral region: Secondary | ICD-10-CM | POA: Diagnosis not present

## 2022-09-02 DIAGNOSIS — M5116 Intervertebral disc disorders with radiculopathy, lumbar region: Secondary | ICD-10-CM | POA: Diagnosis not present

## 2022-09-02 DIAGNOSIS — M4316 Spondylolisthesis, lumbar region: Secondary | ICD-10-CM | POA: Diagnosis not present

## 2022-09-02 DIAGNOSIS — M4807 Spinal stenosis, lumbosacral region: Secondary | ICD-10-CM | POA: Diagnosis not present

## 2022-09-02 DIAGNOSIS — M48061 Spinal stenosis, lumbar region without neurogenic claudication: Secondary | ICD-10-CM | POA: Diagnosis not present

## 2022-09-21 DIAGNOSIS — R030 Elevated blood-pressure reading, without diagnosis of hypertension: Secondary | ICD-10-CM | POA: Diagnosis not present

## 2022-09-21 DIAGNOSIS — R059 Cough, unspecified: Secondary | ICD-10-CM | POA: Diagnosis not present

## 2022-09-21 DIAGNOSIS — Z6828 Body mass index (BMI) 28.0-28.9, adult: Secondary | ICD-10-CM | POA: Diagnosis not present

## 2022-09-28 DIAGNOSIS — M5416 Radiculopathy, lumbar region: Secondary | ICD-10-CM | POA: Diagnosis not present

## 2022-10-11 NOTE — Progress Notes (Deleted)
Patient Care Team: Sasser, Clarene Critchley, MD as PCP - General (Cardiology) Laqueta Linden, MD (Inactive) as PCP - Cardiology (Cardiology) Malachy Mood, MD as Consulting Physician (Hematology) Glenna Fellows, MD (Inactive) as Consulting Physician (General Surgery) Pollyann Samples, NP as Nurse Practitioner (Nurse Practitioner) Laqueta Linden, MD (Inactive) as Attending Physician (Cardiology)   CHIEF COMPLAINT: Follow up left breast cancer   Oncology History Overview Note  Cancer Staging Carcinoma of upper-inner quadrant of left breast in female, estrogen receptor positive (HCC) Staging form: Breast, AJCC 8th Edition - Clinical stage from 01/18/2017: Stage IB (cT2, cN0, cM0, G3, ER: Positive, PR: Positive, HER2: Positive) - Signed by Malachy Mood, MD on 02/10/2017 - Pathologic stage from 02/23/2017: Stage IB (pT3, pN0, cM0, G3, ER: Positive, PR: Positive, HER2: Positive) - Signed by Pollyann Samples, NP on 03/16/2017  Carcinoma of upper-outer quadrant of left breast in female, estrogen receptor positive (HCC) Staging form: Breast, AJCC 8th Edition - Clinical stage from 01/18/2017: Stage IB (cT2, cN0, cM0, G2, ER: Positive, PR: Positive, HER2: Negative) - Unsigned - Pathologic stage from 02/23/2017: Stage IB (pT2, pN0(sn), cM0, G3, ER: Positive, PR: Positive, HER2: Negative) - Signed by Pollyann Samples, NP on 03/16/2017     Carcinoma of upper-outer quadrant of left breast in female, estrogen receptor positive (HCC)  01/13/2017 Mammogram   FINDINGS: In the left breast, a spiculated mass lies in the upper outer quadrant. In the medial posterior left breast, there is a larger lobulated mass. These masses correspond to the palpable abnormalities. No other discrete left breast masses.   In the right breast, there are no discrete masses. There are no areas of architectural distortion.   In both breasts there are scattered calcifications, rounded punctate, with a few other larger coarse  dystrophic calcifications, without significant change from the previous screening mammogram, which is dated 12/04/2009.   Mammographic images were processed with CAD.   On physical exam, there is a firm palpable mass in the upper outer left breast and another firm mass in the medial left breast.     01/13/2017 Breast US   Targeted ultrasound is performed, showing an irregular hypoechoic shadowing mass in the left breast at the 2:30 o'clock position, 5 cm the nipple, measuring 3.3 x 2.8 x 2.6 cm. In the 10:30 o'clock position of the left breast, 5 cm the nipple, there is irregular hypoechoic mass with somewhat more lobulated margins, corresponding to the lobulated mass seen mammographically. This measures 4.8 x 2.9 x 4.8 cm. Both masses show internal vascularity on color Doppler analysis.   Sonographic evaluation of the left axilla shows several nodes with thickened cortices. Status cortex measured is 5 mm. None of these lymph nodes appear enlarged but overall size criteria.   IMPRESSION: 1. Two masses in the left breast, 1 at the 2:30 o'clock position in the other at the 10:30 o'clock position, both highly suspicious for breast carcinoma. 2. Several abnormal left axillary lymph nodes with thickened cortices. 3. No evidence of right breast malignancy.     01/18/2017 Pathology Results   Diagnosis 1. Breast, left, needle core biopsy, 2:30 o'clock - INVASIVE MAMMARY CARCINOMA - MAMMARY CARCINOMA IN-SITU - SEE COMMENT 2. Lymph node, needle/core biopsy, left axilla - NO CARCINOMA IDENTIFIED - SEE COMMENT 3. Breast, left, needle core biopsy, 10:30 o'clock - INVASIVE DUCTAL CARCINOMA - SEE COMMENT  1. PROGNOSTIC INDICATORS Results: IMMUNOHISTOCHEMICAL AND MORPHOMETRIC ANALYSIS PERFORMED MANUALLY Estrogen Receptor: 100%, POSITIVE, STRONG STAINING INTENSITY Progesterone Receptor: 60%, POSITIVE,  STRONG STAINING INTENSITY Proliferation Marker Ki67: 60%  1. FLUORESCENCE  IN-SITU HYBRIDIZATION Results: HER2 - NEGATIVE RATIO OF HER2/CEP17 SIGNALS 1.28 AVERAGE HER2 COPY NUMBER PER CELL 2.75  3. PROGNOSTIC INDICATORS Results: IMMUNOHISTOCHEMICAL AND MORPHOMETRIC ANALYSIS PERFORMED MANUALLY Estrogen Receptor: 100%, POSITIVE, STRONG STAINING INTENSITY Progesterone Receptor: 50%, POSITIVE, STRONG STAINING INTENSITY Proliferation Marker Ki67: 70%  3. FLUORESCENCE IN-SITU HYBRIDIZATION Results: HER2 - **POSITIVE** RATIO OF HER2/CEP17 SIGNALS 2.80 AVERAGE HER2 COPY NUMBER PER CELL 6.15  Microscopic Comment 1. The biopsy material shows an infiltrative proliferation of cells with large vesicular nuclei with inconspicuous nucleoli, arranged linearly and in small clusters. Based on the biopsy, the carcinoma appears Nottingham grade 2 of 3 and measures 0.9 cm in greatest linear extent. E-cadherin and prognostic markers (ER/PR/ki-67/HER2-FISH)are pending and will be reported in an addendum. Dr. Colonel Bald reviewed the case and agrees with the above diagnosis.   02/10/2017 Initial Diagnosis   Carcinoma of upper-outer quadrant of left breast in female, estrogen receptor positive (HCC)   02/19/2017 Imaging   Bone Scan whole Body 02/19/17 IMPRESSION: Negative for evidence of bony metastatic disease.   02/19/2017 Imaging   CT CAP W Contrast 02/19/17 IMPRESSION: 1. Small right middle lobe pulmonary nodules up to 0.8 cm, nonspecific. Non-contrast chest CT at 3-6 months is recommended. If the nodules are stable at time of repeat CT, then future CT at 18-24 months (from today's scan) is considered optional for low-risk patients, but is recommended for high-risk patients. This recommendation follows the consensus statement: Guidelines for Management of Incidental Pulmonary Nodules Detected on CT Images: From the Fleischner Society 2017; Radiology 2017; 284:228-243. 2. Subcentimeter hepatic lesions likely cysts but incompletely characterized due to small size. Attention on  follow-up imaging recommended. 3. 2.1 cm fusiform right common iliac artery aneurysm. Continued surveillance recommended. 4. Aortic Atherosclerosis (ICD10-I70.0) and Emphysema (ICD10-J43.9).   02/23/2017 Pathology Results   Diagnosis 1. Breast, simple mastectomy, Left Total - INVASIVE DUCTAL CARCINOMA, MULTIFOCAL, NOTTINGHAM GRADE 3/3 (5.3 CM, 3.5 CM) - INVASIVE CARCINOMA INVOLVES THE DERMIS - DUCTAL CARCINOMA IN SITU, INTERMEDIATE GRADE - HYALINIZED FIBROADENOMA - MARGINS UNINVOLVED BY CARCINOMA - NO CARCINOMA IDENTIFIED IN TWO LYMPH NODES (0/2) - SEE ONCOLOGY TABLE AND COMMENT BELOW 2. Lymph node, sentinel, biopsy, Left Axillary - NO CARCINOMA IDENTIFIED IN ONE LYMPH NODE (0/1) 3. Lymph node, sentinel, biopsy - NO CARCINOMA IDENTIFIED IN ONE LYMPH NODE (0/1) 4. Lymph node, sentinel, biopsy - NO CARCINOMA IDENTIFIED IN ONE LYMPH NODE (0/1) 5. Lymph node, sentinel, biopsy - NO CARCINOMA IDENTIFIED IN ONE LYMPH NODE (0/1) Microscopic Comment 1. BREAST, INVASIVE TUMOR Procedure: Simple mastectomy Laterality: Left Tumor Size: 5. 3 cm, 3.5 cm Histologic Type: Invasive carcinoma of no special type (ductal, not otherwise specified) Grade: Nottingham Grade 3 Tubular Differentiation: 3 Nuclear Pleomorphism: 3 Mitotic Count: 2 Ductal Carcinoma in Situ (DCIS): Present Extent of Tumor: Skin: Invasive carcinoma directly invades into the dermis or epidermis without skin ulceration Margins: Invasive carcinoma, distance from closest margin: 1.5 cm (posterior) DCIS, distance from closest margin: > 1 cm Regional Lymph Nodes: Number of Lymph Nodes Examined: 6 Number of Sentinel Lymph Nodes Examined: 4 Lymph Nodes with Macrometastases: 0 Lymph Nodes with Micrometastases: 0 Lymph Nodes with Isolated Tumor Cells: 0 Treatment effect: No known presurgical therapy Breast Prognostic Profile: See Also ([id number]) Estrogen Receptor: Positive (100%, strong); Positive (100%,  strong) Progesterone Receptor: Positive (50%, strong); Positive (50%, strong) Her2: Positive (Ratio 2.80); Negative (Ratio 1.28) Ki-67: 70%; 60% Best tumor block for sendout testing: 1E Pathologic Stage Classification (  pTNM, AJCC 8th Edition): Primary Tumor: mpT3 Regional Lymph Nodes: pN0 COMMENT: The two invasive carcinomas in the breast have slightly different morphologies. The larger lesion has a papillary and micropapillary pattern admixed with typical ductal carcinoma while the smaller lesion is more typical of a ductal lesion with linear arrays (E-cadherin performed on biopsy). There are foci within the larger lesion that are concerning for lymphovascular space invasion.    04/07/2017 - 04/27/2018 Chemotherapy   weekly taxol and herceptin x12 weeks, 04/07/17-06/23/17. Then herceptin q3 weeks for total 1 year which was switched to Kadcyla q3weeks on 07/14/17. Stopped after 2 doses due to worsening neuropathy. Switched back to maintenance Herceptin started 8/12019. Completed on 04/27/18.    07/20/2017 - 09/03/2017 Radiation Therapy   She started adjuvant radiation on 07/20/17 with Dr. Mitzi Hansen and plans to complete on 09/03/17.    10/2017 -  Anti-estrogen oral therapy   Anastrozole 1mg  daily starting 10/2017   09/27/2018 Procedure   She had her PAC removed on 09/27/18.       CURRENT THERAPY: Anastrozole, starting 10/2017  INTERVAL HISTORY Ms. Solano returns for follow up as scheduled, last seen by me 04/09/22.   ROS   Past Medical History:  Diagnosis Date   Arthritis    "maybe a little in my left knee" (02/23/2017)   Asthmatic bronchitis    Cancer of left breast (HCC) 2019   Coronary atherosclerosis    Minor nonobstructive CAD at cardiac catheterization 2009   Dyslipidemia    Femoral bruit 7/08   With normal ABIs   Hypertension    Iliac artery aneurysm (HCC)    Lower extremity edema    Chronic   Pneumonia ~ 1950   in 8th grade   Type II diabetes mellitus (HCC)      Past Surgical  History:  Procedure Laterality Date   APPENDECTOMY     BREAST BIOPSY Left 02/2017   CARDIAC CATHETERIZATION     "long time ago" (02/23/2017)   MASTECTOMY COMPLETE / SIMPLE W/ SENTINEL NODE BIOPSY Left 02/23/2017   TOTAL MATECTOMY;  LEFT AXILLARY SENTINEL LYMPH NODE BIOPSY ERAS PATHWAY   MASTECTOMY WITH RADIOACTIVE SEED GUIDED EXCISION AND AXILLARY SENTINEL LYMPH NODE BIOPSY Left 02/23/2017   Procedure: LEFT TOTAL MATECTOMY;  LEFT AXILLARY SENTINEL LYMPH NODE BIOPSY ERAS PATHWAY;  Surgeon: Glenna Fellows, MD;  Location: [facility] OR;  Service: General;  Laterality: Left;   PACEMAKER IMPLANT N/A 10/10/2020   Procedure: PACEMAKER IMPLANT;  Surgeon: Marinus Maw, MD;  Location: [facility];  Service: Cardiovascular;  Laterality: N/A;   PILONIDAL CYST EXCISION     PORT-A-CATH REMOVAL Right 09/27/2018   Procedure: REMOVAL PORT-A-CATH;  Surgeon: Claud Kelp, MD;  Location: [facility];  Service: General;  Laterality: Right;   PORTA CATH INSERTION  02/23/2017   PORTACATH PLACEMENT N/A 02/23/2017   Procedure: INSERTION PORT-A-CATH;  Surgeon: Glenna Fellows, MD;  Location: [facility] OR;  Service: General;  Laterality: N/A;   SHOULDER ARTHROSCOPY W/ ROTATOR CUFF REPAIR Right      Outpatient Encounter Medications as of 10/12/2022  Medication Sig Note   albuterol (VENTOLIN HFA) 108 (90 Base) MCG/ACT inhaler Inhale 1 puff into the lungs every 6 (six) hours as needed for wheezing or shortness of breath.    alendronate (FOSAMAX) 70 MG tablet Take 70 mg by mouth once a week. 10/10/2020: Take on Mondays    amLODipine (NORVASC) 5 MG tablet Take 1 tablet (5 mg total) by mouth daily.    anastrozole (  ARIMIDEX) 1 MG tablet Take 1 tablet (1 mg total) by mouth daily.    aspirin 81 MG EC tablet Take 81 mg by mouth at bedtime.    chlorthalidone (HYGROTON) 25 MG tablet Take 25 mg by mouth daily.    Coenzyme Q10 (COQ10) 100 MG CAPS Take 100 mg by mouth daily.    cyclobenzaprine (FLEXERIL) 10 MG tablet  Take 5-10 mg by mouth at bedtime as needed. As directed    latanoprost (XALATAN) 0.005 % ophthalmic solution Place 1 drop into both eyes at bedtime.    losartan (COZAAR) 100 MG tablet Take 100 mg by mouth daily.    metFORMIN (GLUCOPHAGE) 500 MG tablet Take 1,000 mg by mouth daily.    pregabalin (LYRICA) 150 MG capsule TAKE ONE CAPSULE BY MOUTH TWICE A DAY    rosuvastatin (CRESTOR) 5 MG tablet Take 5 mg by mouth every other day.    vitamin B-12 (CYANOCOBALAMIN) 1000 MCG tablet Take 1,000 mcg by mouth daily.    Vitamin D, Cholecalciferol, 25 MCG (1000 UT) CAPS Take 25 mcg by mouth daily.    No facility-administered encounter medications on file as of 10/12/2022.     There were no vitals filed for this visit. There is no height or weight on file to calculate BMI.   PHYSICAL EXAM GENERAL:alert, no distress and comfortable SKIN: no rash  EYES: sclera clear NECK: without mass LYMPH:  no palpable cervical or supraclavicular lymphadenopathy  LUNGS: clear with normal breathing effort HEART: regular rate & rhythm, no lower extremity edema ABDOMEN: abdomen soft, non-tender and normal bowel sounds NEURO: alert & oriented x 3 with fluent speech, no focal motor/sensory deficits Breast exam:  PAC without erythema    CBC    Component Value Date/Time   WBC 4.2 04/09/2022 0853   WBC 4.3 10/10/2020 0127   RBC 4.02 04/09/2022 0853   HGB 12.0 04/09/2022 0853   HCT 36.7 04/09/2022 0853   PLT 235 04/09/2022 0853   MCV 91.3 04/09/2022 0853   MCH 29.9 04/09/2022 0853   MCHC 32.7 04/09/2022 0853   RDW 13.2 04/09/2022 0853   LYMPHSABS 1.4 04/09/2022 0853   MONOABS 0.4 04/09/2022 0853   EOSABS 0.1 04/09/2022 0853   BASOSABS 0.0 04/09/2022 0853     CMP     Component Value Date/Time   NA 135 04/09/2022 0853   K 3.5 04/09/2022 0853   CL 97 (L) 04/09/2022 0853   CO2 31 04/09/2022 0853   GLUCOSE 147 (H) 04/09/2022 0853   BUN 17 04/09/2022 0853   CREATININE 0.82 04/09/2022 0853   CALCIUM 9.5  04/09/2022 0853   PROT 7.0 04/09/2022 0853   ALBUMIN 4.2 04/09/2022 0853   AST 17 04/09/2022 0853   ALT 12 04/09/2022 0853   ALKPHOS 39 04/09/2022 0853   BILITOT 0.8 04/09/2022 0853   GFRNONAA >60 04/09/2022 0853   GFRAA >60 10/06/2019 1014     ASSESSMENT & PLAN:Penny Huynh is an 89 y.o. female    1.  Multifocal invasive ductal carcinoma of the left breast of female; carcinoma of the upper-outer quadrant of left breast and female, ER/PR positive, HER-2 negative pT2N0M0, G2, Stage 1B; IDC of the upper-inner quadrant left breast, ER+/PR+/HER2+, pT3N0M0 G3, stage IB -Diagnosed in 01/2017, s/p left mastectomy 02/23/2017, adjuvant chemo and Herceptin and adjuvant radiation -Began antiestrogen with anastrozole in 10/2017, goal of 5 years, tolerating well.  Complete in 10/2022 -R mammogram 04/2022 ***   2.  Balance issue, headaches -Onset ~08/2021 -off balance but  no fall, uses cane. Has tried PT for neuropathy in the past, she thought was helpful but feels she can manage on her own now.  -HA's improved from 10/2021 - 01/2022, mild and occasional -No need for further imaging/work up at this time -Monitoring    3. Anemia and neutropenia -Mild and intermittent since chemo in 2019  -Reviewed infection precautions -CBC normal today, continue monitoring   4. 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 Barbaraann Cao as needed   5. HTN, CAD, DM, bradycardia and third-degree AV block -has a pacemaker -F/up PCP and cardiology   6.  Osteopenia -Improved on last DEXA 10/2020, lowest T score -1.9 at the forearm radius (improved from -2.6 which was osteoporosis) -She is on fosamax -Continue calcium, vitamin D, and repeat DEXA in 10/2022. She would like to come off fosamax if DEXA improves      PLAN:  No orders of the defined types were placed in this encounter.     All questions were answered. The patient knows to call the clinic with any  problems, questions or concerns. No barriers to learning were detected. I spent *** counseling the patient face to face. The total time spent in the appointment was *** and more than 50% was on counseling, review of test results, and coordination of care.   Santiago Glad, NP-C @DATE @

## 2022-10-12 ENCOUNTER — Ambulatory Visit: Payer: Medicare Other | Admitting: Nurse Practitioner

## 2022-10-12 ENCOUNTER — Inpatient Hospital Stay: Payer: Medicare Other

## 2022-10-12 ENCOUNTER — Telehealth: Payer: Self-pay | Admitting: Nurse Practitioner

## 2022-10-14 ENCOUNTER — Telehealth: Payer: Self-pay | Admitting: Nurse Practitioner

## 2022-10-19 ENCOUNTER — Ambulatory Visit
Admission: RE | Admit: 2022-10-19 | Discharge: 2022-10-19 | Disposition: A | Discharge status: Home / Self Care | Payer: Medicare Other | Source: Ambulatory Visit | Attending: Nurse Practitioner

## 2022-10-19 DIAGNOSIS — E2839 Other primary ovarian failure: Secondary | ICD-10-CM

## 2022-10-19 DIAGNOSIS — N958 Other specified menopausal and perimenopausal disorders: Secondary | ICD-10-CM | POA: Diagnosis not present

## 2022-10-19 DIAGNOSIS — E349 Endocrine disorder, unspecified: Secondary | ICD-10-CM | POA: Diagnosis not present

## 2022-10-19 DIAGNOSIS — M8588 Other specified disorders of bone density and structure, other site: Secondary | ICD-10-CM | POA: Diagnosis not present

## 2022-10-19 DIAGNOSIS — Z170 Estrogen receptor positive status [ER+]: Secondary | ICD-10-CM

## 2022-10-21 NOTE — Progress Notes (Unsigned)
Patient Care Team: Sasser, Clarene Critchley, MD as PCP - General (Cardiology) Laqueta Linden, MD (Inactive) as PCP - Cardiology (Cardiology) Malachy Mood, MD as Consulting Physician (Hematology) Glenna Fellows, MD (Inactive) as Consulting Physician (General Surgery) Pollyann Samples, NP as Nurse Practitioner (Nurse Practitioner) Laqueta Linden, MD (Inactive) as Attending Physician (Cardiology)   CHIEF COMPLAINT: Follow up left breast cancer   Oncology History Overview Note  Cancer Staging Carcinoma of upper-inner quadrant of left breast in female, estrogen receptor positive (HCC) Staging form: Breast, AJCC 8th Edition - Clinical stage from 01/18/2017: Stage IB (cT2, cN0, cM0, G3, ER: Positive, PR: Positive, HER2: Positive) - Signed by Malachy Mood, MD on 02/10/2017 - Pathologic stage from 02/23/2017: Stage IB (pT3, pN0, cM0, G3, ER: Positive, PR: Positive, HER2: Positive) - Signed by Pollyann Samples, NP on 03/16/2017  Carcinoma of upper-outer quadrant of left breast in female, estrogen receptor positive (HCC) Staging form: Breast, AJCC 8th Edition - Clinical stage from 01/18/2017: Stage IB (cT2, cN0, cM0, G2, ER: Positive, PR: Positive, HER2: Negative) - Unsigned - Pathologic stage from 02/23/2017: Stage IB (pT2, pN0(sn), cM0, G3, ER: Positive, PR: Positive, HER2: Negative) - Signed by Pollyann Samples, NP on 03/16/2017     Carcinoma of upper-outer quadrant of left breast in female, estrogen receptor positive (HCC)  01/13/2017 Mammogram   FINDINGS: In the left breast, a spiculated mass lies in the upper outer quadrant. In the medial posterior left breast, there is a larger lobulated mass. These masses correspond to the palpable abnormalities. No other discrete left breast masses.   In the right breast, there are no discrete masses. There are no areas of architectural distortion.   In both breasts there are scattered calcifications, rounded punctate, with a few other larger coarse  dystrophic calcifications, without significant change from the previous screening mammogram, which is dated 12/04/2009.   Mammographic images were processed with CAD.   On physical exam, there is a firm palpable mass in the upper outer left breast and another firm mass in the medial left breast.     01/13/2017 Breast US   Targeted ultrasound is performed, showing an irregular hypoechoic shadowing mass in the left breast at the 2:30 o'clock position, 5 cm the nipple, measuring 3.3 x 2.8 x 2.6 cm. In the 10:30 o'clock position of the left breast, 5 cm the nipple, there is irregular hypoechoic mass with somewhat more lobulated margins, corresponding to the lobulated mass seen mammographically. This measures 4.8 x 2.9 x 4.8 cm. Both masses show internal vascularity on color Doppler analysis.   Sonographic evaluation of the left axilla shows several nodes with thickened cortices. Status cortex measured is 5 mm. None of these lymph nodes appear enlarged but overall size criteria.   IMPRESSION: 1. Two masses in the left breast, 1 at the 2:30 o'clock position in the other at the 10:30 o'clock position, both highly suspicious for breast carcinoma. 2. Several abnormal left axillary lymph nodes with thickened cortices. 3. No evidence of right breast malignancy.     01/18/2017 Pathology Results   Diagnosis 1. Breast, left, needle core biopsy, 2:30 o'clock - INVASIVE MAMMARY CARCINOMA - MAMMARY CARCINOMA IN-SITU - SEE COMMENT 2. Lymph node, needle/core biopsy, left axilla - NO CARCINOMA IDENTIFIED - SEE COMMENT 3. Breast, left, needle core biopsy, 10:30 o'clock - INVASIVE DUCTAL CARCINOMA - SEE COMMENT  1. PROGNOSTIC INDICATORS Results: IMMUNOHISTOCHEMICAL AND MORPHOMETRIC ANALYSIS PERFORMED MANUALLY Estrogen Receptor: 100%, POSITIVE, STRONG STAINING INTENSITY Progesterone Receptor: 60%, POSITIVE,  STRONG STAINING INTENSITY Proliferation Marker Ki67: 60%  1. FLUORESCENCE  IN-SITU HYBRIDIZATION Results: HER2 - NEGATIVE RATIO OF HER2/CEP17 SIGNALS 1.28 AVERAGE HER2 COPY NUMBER PER CELL 2.75  3. PROGNOSTIC INDICATORS Results: IMMUNOHISTOCHEMICAL AND MORPHOMETRIC ANALYSIS PERFORMED MANUALLY Estrogen Receptor: 100%, POSITIVE, STRONG STAINING INTENSITY Progesterone Receptor: 50%, POSITIVE, STRONG STAINING INTENSITY Proliferation Marker Ki67: 70%  3. FLUORESCENCE IN-SITU HYBRIDIZATION Results: HER2 - **POSITIVE** RATIO OF HER2/CEP17 SIGNALS 2.80 AVERAGE HER2 COPY NUMBER PER CELL 6.15  Microscopic Comment 1. The biopsy material shows an infiltrative proliferation of cells with large vesicular nuclei with inconspicuous nucleoli, arranged linearly and in small clusters. Based on the biopsy, the carcinoma appears Nottingham grade 2 of 3 and measures 0.9 cm in greatest linear extent. E-cadherin and prognostic markers (ER/PR/ki-67/HER2-FISH)are pending and will be reported in an addendum. Dr. Colonel Bald reviewed the case and agrees with the above diagnosis.   02/10/2017 Initial Diagnosis   Carcinoma of upper-outer quadrant of left breast in female, estrogen receptor positive (HCC)   02/19/2017 Imaging   Bone Scan whole Body 02/19/17 IMPRESSION: Negative for evidence of bony metastatic disease.   02/19/2017 Imaging   CT CAP W Contrast 02/19/17 IMPRESSION: 1. Small right middle lobe pulmonary nodules up to 0.8 cm, nonspecific. Non-contrast chest CT at 3-6 months is recommended. If the nodules are stable at time of repeat CT, then future CT at 18-24 months (from today's scan) is considered optional for low-risk patients, but is recommended for high-risk patients. This recommendation follows the consensus statement: Guidelines for Management of Incidental Pulmonary Nodules Detected on CT Images: From the Fleischner Society 2017; Radiology 2017; 284:228-243. 2. Subcentimeter hepatic lesions likely cysts but incompletely characterized due to small size. Attention on  follow-up imaging recommended. 3. 2.1 cm fusiform right common iliac artery aneurysm. Continued surveillance recommended. 4. Aortic Atherosclerosis (ICD10-I70.0) and Emphysema (ICD10-J43.9).   02/23/2017 Pathology Results   Diagnosis 1. Breast, simple mastectomy, Left Total - INVASIVE DUCTAL CARCINOMA, MULTIFOCAL, NOTTINGHAM GRADE 3/3 (5.3 CM, 3.5 CM) - INVASIVE CARCINOMA INVOLVES THE DERMIS - DUCTAL CARCINOMA IN SITU, INTERMEDIATE GRADE - HYALINIZED FIBROADENOMA - MARGINS UNINVOLVED BY CARCINOMA - NO CARCINOMA IDENTIFIED IN TWO LYMPH NODES (0/2) - SEE ONCOLOGY TABLE AND COMMENT BELOW 2. Lymph node, sentinel, biopsy, Left Axillary - NO CARCINOMA IDENTIFIED IN ONE LYMPH NODE (0/1) 3. Lymph node, sentinel, biopsy - NO CARCINOMA IDENTIFIED IN ONE LYMPH NODE (0/1) 4. Lymph node, sentinel, biopsy - NO CARCINOMA IDENTIFIED IN ONE LYMPH NODE (0/1) 5. Lymph node, sentinel, biopsy - NO CARCINOMA IDENTIFIED IN ONE LYMPH NODE (0/1) Microscopic Comment 1. BREAST, INVASIVE TUMOR Procedure: Simple mastectomy Laterality: Left Tumor Size: 5. 3 cm, 3.5 cm Histologic Type: Invasive carcinoma of no special type (ductal, not otherwise specified) Grade: Nottingham Grade 3 Tubular Differentiation: 3 Nuclear Pleomorphism: 3 Mitotic Count: 2 Ductal Carcinoma in Situ (DCIS): Present Extent of Tumor: Skin: Invasive carcinoma directly invades into the dermis or epidermis without skin ulceration Margins: Invasive carcinoma, distance from closest margin: 1.5 cm (posterior) DCIS, distance from closest margin: > 1 cm Regional Lymph Nodes: Number of Lymph Nodes Examined: 6 Number of Sentinel Lymph Nodes Examined: 4 Lymph Nodes with Macrometastases: 0 Lymph Nodes with Micrometastases: 0 Lymph Nodes with Isolated Tumor Cells: 0 Treatment effect: No known presurgical therapy Breast Prognostic Profile: See Also ([id number]) Estrogen Receptor: Positive (100%, strong); Positive (100%,  strong) Progesterone Receptor: Positive (50%, strong); Positive (50%, strong) Her2: Positive (Ratio 2.80); Negative (Ratio 1.28) Ki-67: 70%; 60% Best tumor block for sendout testing: 1E Pathologic Stage Classification (  pTNM, AJCC 8th Edition): Primary Tumor: mpT3 Regional Lymph Nodes: pN0 COMMENT: The two invasive carcinomas in the breast have slightly different morphologies. The larger lesion has a papillary and micropapillary pattern admixed with typical ductal carcinoma while the smaller lesion is more typical of a ductal lesion with linear arrays (E-cadherin performed on biopsy). There are foci within the larger lesion that are concerning for lymphovascular space invasion.    04/07/2017 - 04/27/2018 Chemotherapy   weekly taxol and herceptin x12 weeks, 04/07/17-06/23/17. Then herceptin q3 weeks for total 1 year which was switched to Kadcyla q3weeks on 07/14/17. Stopped after 2 doses due to worsening neuropathy. Switched back to maintenance Herceptin started 8/12019. Completed on 04/27/18.    07/20/2017 - 09/03/2017 Radiation Therapy   She started adjuvant radiation on 07/20/17 with Dr. Mitzi Hansen and plans to complete on 09/03/17.    10/2017 -  Anti-estrogen oral therapy   Anastrozole 1mg  daily starting 10/2017   09/27/2018 Procedure   She had her PAC removed on 09/27/18.       CURRENT THERAPY: Anastrozole, starting 10/2017  INTERVAL HISTORY Ms. Tiburcio returns for follow up as scheduled, last seen by me 04/09/22. Recent DEXA showed worsening osteopenia now osteoporosis.   ROS   Past Medical History:  Diagnosis Date   Arthritis    "maybe a little in my left knee" (02/23/2017)   Asthmatic bronchitis    Cancer of left breast (HCC) 2019   Coronary atherosclerosis    Minor nonobstructive CAD at cardiac catheterization 2009   Dyslipidemia    Femoral bruit 7/08   With normal ABIs   Hypertension    Iliac artery aneurysm (HCC)    Lower extremity edema    Chronic   Pneumonia ~ 1950   in 8th grade    Type II diabetes mellitus (HCC)      Past Surgical History:  Procedure Laterality Date   APPENDECTOMY     BREAST BIOPSY Left 02/2017   CARDIAC CATHETERIZATION     "long time ago" (02/23/2017)   MASTECTOMY COMPLETE / SIMPLE W/ SENTINEL NODE BIOPSY Left 02/23/2017   TOTAL MATECTOMY;  LEFT AXILLARY SENTINEL LYMPH NODE BIOPSY ERAS PATHWAY   MASTECTOMY WITH RADIOACTIVE SEED GUIDED EXCISION AND AXILLARY SENTINEL LYMPH NODE BIOPSY Left 02/23/2017   Procedure: LEFT TOTAL MATECTOMY;  LEFT AXILLARY SENTINEL LYMPH NODE BIOPSY ERAS PATHWAY;  Surgeon: Glenna Fellows, MD;  Location: [facility] OR;  Service: General;  Laterality: Left;   PACEMAKER IMPLANT N/A 10/10/2020   Procedure: PACEMAKER IMPLANT;  Surgeon: Marinus Maw, MD;  Location: [facility];  Service: Cardiovascular;  Laterality: N/A;   PILONIDAL CYST EXCISION     PORT-A-CATH REMOVAL Right 09/27/2018   Procedure: REMOVAL PORT-A-CATH;  Surgeon: Claud Kelp, MD;  Location: [facility];  Service: General;  Laterality: Right;   PORTA CATH INSERTION  02/23/2017   PORTACATH PLACEMENT N/A 02/23/2017   Procedure: INSERTION PORT-A-CATH;  Surgeon: Glenna Fellows, MD;  Location: [facility] OR;  Service: General;  Laterality: N/A;   SHOULDER ARTHROSCOPY W/ ROTATOR CUFF REPAIR Right      Outpatient Encounter Medications as of 10/22/2022  Medication Sig Note   albuterol (VENTOLIN HFA) 108 (90 Base) MCG/ACT inhaler Inhale 1 puff into the lungs every 6 (six) hours as needed for wheezing or shortness of breath.    alendronate (FOSAMAX) 70 MG tablet Take 70 mg by mouth once a week. 10/10/2020: Take on Mondays    amLODipine (NORVASC) 5 MG tablet Take 1 tablet (5 mg total)  by mouth daily.    anastrozole (ARIMIDEX) 1 MG tablet Take 1 tablet (1 mg total) by mouth daily.    aspirin 81 MG EC tablet Take 81 mg by mouth at bedtime.    chlorthalidone (HYGROTON) 25 MG tablet Take 25 mg by mouth daily.    Coenzyme Q10 (COQ10) 100 MG CAPS Take 100 mg by  mouth daily.    cyclobenzaprine (FLEXERIL) 10 MG tablet Take 5-10 mg by mouth at bedtime as needed. As directed    latanoprost (XALATAN) 0.005 % ophthalmic solution Place 1 drop into both eyes at bedtime.    losartan (COZAAR) 100 MG tablet Take 100 mg by mouth daily.    metFORMIN (GLUCOPHAGE) 500 MG tablet Take 1,000 mg by mouth daily.    pregabalin (LYRICA) 150 MG capsule TAKE ONE CAPSULE BY MOUTH TWICE A DAY    rosuvastatin (CRESTOR) 5 MG tablet Take 5 mg by mouth every other day.    vitamin B-12 (CYANOCOBALAMIN) 1000 MCG tablet Take 1,000 mcg by mouth daily.    Vitamin D, Cholecalciferol, 25 MCG (1000 UT) CAPS Take 25 mcg by mouth daily.    No facility-administered encounter medications on file as of 10/22/2022.     There were no vitals filed for this visit. There is no height or weight on file to calculate BMI.   PHYSICAL EXAM GENERAL:alert, no distress and comfortable SKIN: no rash  EYES: sclera clear NECK: without mass LYMPH:  no palpable cervical or supraclavicular lymphadenopathy  LUNGS: clear with normal breathing effort HEART: regular rate & rhythm, no lower extremity edema ABDOMEN: abdomen soft, non-tender and normal bowel sounds NEURO: alert & oriented x 3 with fluent speech, no focal motor/sensory deficits Breast exam:  PAC without erythema    CBC    Component Value Date/Time   WBC 4.2 04/09/2022 0853   WBC 4.3 10/10/2020 0127   RBC 4.02 04/09/2022 0853   HGB 12.0 04/09/2022 0853   HCT 36.7 04/09/2022 0853   PLT 235 04/09/2022 0853   MCV 91.3 04/09/2022 0853   MCH 29.9 04/09/2022 0853   MCHC 32.7 04/09/2022 0853   RDW 13.2 04/09/2022 0853   LYMPHSABS 1.4 04/09/2022 0853   MONOABS 0.4 04/09/2022 0853   EOSABS 0.1 04/09/2022 0853   BASOSABS 0.0 04/09/2022 0853     CMP     Component Value Date/Time   NA 135 04/09/2022 0853   K 3.5 04/09/2022 0853   CL 97 (L) 04/09/2022 0853   CO2 31 04/09/2022 0853   GLUCOSE 147 (H) 04/09/2022 0853   BUN 17 04/09/2022  0853   CREATININE 0.82 04/09/2022 0853   CALCIUM 9.5 04/09/2022 0853   PROT 7.0 04/09/2022 0853   ALBUMIN 4.2 04/09/2022 0853   AST 17 04/09/2022 0853   ALT 12 04/09/2022 0853   ALKPHOS 39 04/09/2022 0853   BILITOT 0.8 04/09/2022 0853   GFRNONAA >60 04/09/2022 0853   GFRAA >60 10/06/2019 1014     ASSESSMENT & PLAN:Penny Huynh is an 89 y.o. female    1.  Multifocal invasive ductal carcinoma of the left breast of female; carcinoma of the upper-outer quadrant of left breast and female, ER/PR positive, HER-2 negative pT2N0M0, G2, Stage 1B; IDC of the upper-inner quadrant left breast, ER+/PR+/HER2+, pT3N0M0 G3, stage IB -Diagnosed in 01/2017, s/p left mastectomy 02/23/2017, adjuvant chemo and Herceptin and adjuvant radiation -Began antiestrogen with anastrozole in 10/2017, goal of 5 years, tolerating well.  Complete in 10/2022 -R mammogram 04/2022 ***   2.  Balance  issue, headaches -Onset ~08/2021 -off balance but no fall, uses cane. Has tried PT for neuropathy in the past, she thought was helpful but feels she can manage on her own now.  -HA's improved from 10/2021 - 01/2022, mild and occasional -No need for further imaging/work up at this time -Monitoring    3. Anemia and neutropenia -Mild and intermittent since chemo in 2019  -Reviewed infection precautions -CBC normal today, continue monitoring   4. 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 Barbaraann Cao as needed   5. HTN, CAD, DM, bradycardia and third-degree AV block -has a pacemaker -F/up PCP and cardiology   6.  Osteopenia -Improved on last DEXA 10/2020, lowest T score -1.9 at the forearm radius (improved from -2.6 which was osteoporosis) -She is on fosamax -Continue calcium, vitamin D, and repeat DEXA in 10/2022. She would like to come off fosamax if DEXA improves      PLAN:  No orders of the defined types were placed in this encounter.     All questions were  answered. The patient knows to call the clinic with any problems, questions or concerns. No barriers to learning were detected. I spent *** counseling the patient face to face. The total time spent in the appointment was *** and more than 50% was on counseling, review of test results, and coordination of care.   Santiago Glad, NP-C @DATE @

## 2022-10-22 ENCOUNTER — Ambulatory Visit (INDEPENDENT_AMBULATORY_CARE_PROVIDER_SITE_OTHER): Payer: Medicare Other

## 2022-10-22 ENCOUNTER — Other Ambulatory Visit: Payer: Self-pay

## 2022-10-22 ENCOUNTER — Encounter: Payer: Self-pay | Admitting: Nurse Practitioner

## 2022-10-22 ENCOUNTER — Inpatient Hospital Stay (HOSPITAL_BASED_OUTPATIENT_CLINIC_OR_DEPARTMENT_OTHER): Payer: Medicare Other | Admitting: Nurse Practitioner

## 2022-10-22 ENCOUNTER — Inpatient Hospital Stay: Payer: Medicare Other | Attending: Nurse Practitioner

## 2022-10-22 VITALS — BP 138/64 | HR 70 | Temp 98.3°F | Resp 16 | Ht 64.5 in | Wt 164.5 lb

## 2022-10-22 DIAGNOSIS — Z9012 Acquired absence of left breast and nipple: Secondary | ICD-10-CM | POA: Insufficient documentation

## 2022-10-22 DIAGNOSIS — C50412 Malignant neoplasm of upper-outer quadrant of left female breast: Secondary | ICD-10-CM | POA: Insufficient documentation

## 2022-10-22 DIAGNOSIS — C50212 Malignant neoplasm of upper-inner quadrant of left female breast: Secondary | ICD-10-CM | POA: Diagnosis not present

## 2022-10-22 DIAGNOSIS — Z170 Estrogen receptor positive status [ER+]: Secondary | ICD-10-CM | POA: Insufficient documentation

## 2022-10-22 DIAGNOSIS — D649 Anemia, unspecified: Secondary | ICD-10-CM | POA: Insufficient documentation

## 2022-10-22 DIAGNOSIS — N6459 Other signs and symptoms in breast: Secondary | ICD-10-CM

## 2022-10-22 DIAGNOSIS — Z9221 Personal history of antineoplastic chemotherapy: Secondary | ICD-10-CM | POA: Insufficient documentation

## 2022-10-22 DIAGNOSIS — Z7983 Long term (current) use of bisphosphonates: Secondary | ICD-10-CM | POA: Insufficient documentation

## 2022-10-22 DIAGNOSIS — E1042 Type 1 diabetes mellitus with diabetic polyneuropathy: Secondary | ICD-10-CM | POA: Insufficient documentation

## 2022-10-22 DIAGNOSIS — Z79811 Long term (current) use of aromatase inhibitors: Secondary | ICD-10-CM | POA: Insufficient documentation

## 2022-10-22 DIAGNOSIS — I2510 Atherosclerotic heart disease of native coronary artery without angina pectoris: Secondary | ICD-10-CM | POA: Insufficient documentation

## 2022-10-22 DIAGNOSIS — Z950 Presence of cardiac pacemaker: Secondary | ICD-10-CM | POA: Diagnosis not present

## 2022-10-22 DIAGNOSIS — Z79899 Other long term (current) drug therapy: Secondary | ICD-10-CM | POA: Diagnosis not present

## 2022-10-22 DIAGNOSIS — Z923 Personal history of irradiation: Secondary | ICD-10-CM | POA: Diagnosis not present

## 2022-10-22 DIAGNOSIS — Z794 Long term (current) use of insulin: Secondary | ICD-10-CM | POA: Diagnosis not present

## 2022-10-22 DIAGNOSIS — Z7984 Long term (current) use of oral hypoglycemic drugs: Secondary | ICD-10-CM | POA: Insufficient documentation

## 2022-10-22 DIAGNOSIS — I442 Atrioventricular block, complete: Secondary | ICD-10-CM | POA: Diagnosis not present

## 2022-10-22 DIAGNOSIS — I10 Essential (primary) hypertension: Secondary | ICD-10-CM | POA: Insufficient documentation

## 2022-10-22 LAB — CUP PACEART REMOTE DEVICE CHECK
Battery Remaining Longevity: 85 mo
Battery Remaining Percentage: 81 %
Battery Voltage: 3.01 V
Brady Statistic AP VP Percent: 25 %
Brady Statistic AP VS Percent: 1 %
Brady Statistic AS VP Percent: 75 %
Brady Statistic AS VS Percent: 1 %
Brady Statistic RA Percent Paced: 24 %
Brady Statistic RV Percent Paced: 99 %
Date Time Interrogation Session: 20240919020014
Implantable Lead Connection Status: 753985
Implantable Lead Connection Status: 753985
Implantable Lead Implant Date: 20220908
Implantable Lead Implant Date: 20220908
Implantable Lead Location: 753859
Implantable Lead Location: 753860
Implantable Pulse Generator Implant Date: 20220908
Lead Channel Impedance Value: 460 Ohm
Lead Channel Impedance Value: 530 Ohm
Lead Channel Pacing Threshold Amplitude: 0.5 V
Lead Channel Pacing Threshold Amplitude: 0.75 V
Lead Channel Pacing Threshold Pulse Width: 0.5 ms
Lead Channel Pacing Threshold Pulse Width: 0.5 ms
Lead Channel Sensing Intrinsic Amplitude: 0.7 mV
Lead Channel Sensing Intrinsic Amplitude: 12 mV
Lead Channel Setting Pacing Amplitude: 2 V
Lead Channel Setting Pacing Amplitude: 2.5 V
Lead Channel Setting Pacing Pulse Width: 0.5 ms
Lead Channel Setting Sensing Sensitivity: 2 mV
Pulse Gen Model: 2272
Pulse Gen Serial Number: 6519562

## 2022-10-22 LAB — CBC WITH DIFFERENTIAL (CANCER CENTER ONLY)
Abs Immature Granulocytes: 0.01 10*3/uL (ref 0.00–0.07)
Basophils Absolute: 0.1 10*3/uL (ref 0.0–0.1)
Basophils Relative: 2 %
Eosinophils Absolute: 0.1 10*3/uL (ref 0.0–0.5)
Eosinophils Relative: 4 %
HCT: 37.1 % (ref 36.0–46.0)
Hemoglobin: 11.9 g/dL — ABNORMAL LOW (ref 12.0–15.0)
Immature Granulocytes: 0 %
Lymphocytes Relative: 45 %
Lymphs Abs: 1.8 10*3/uL (ref 0.7–4.0)
MCH: 30.1 pg (ref 26.0–34.0)
MCHC: 32.1 g/dL (ref 30.0–36.0)
MCV: 93.9 fL (ref 80.0–100.0)
Monocytes Absolute: 0.4 10*3/uL (ref 0.1–1.0)
Monocytes Relative: 10 %
Neutro Abs: 1.5 10*3/uL — ABNORMAL LOW (ref 1.7–7.7)
Neutrophils Relative %: 39 %
Platelet Count: 255 10*3/uL (ref 150–400)
RBC: 3.95 MIL/uL (ref 3.87–5.11)
RDW: 13.2 % (ref 11.5–15.5)
WBC Count: 3.9 10*3/uL — ABNORMAL LOW (ref 4.0–10.5)
nRBC: 0 % (ref 0.0–0.2)

## 2022-10-22 LAB — CMP (CANCER CENTER ONLY)
ALT: 10 U/L (ref 0–44)
AST: 17 U/L (ref 15–41)
Albumin: 4.3 g/dL (ref 3.5–5.0)
Alkaline Phosphatase: 44 U/L (ref 38–126)
Anion gap: 7 (ref 5–15)
BUN: 18 mg/dL (ref 8–23)
CO2: 29 mmol/L (ref 22–32)
Calcium: 9.7 mg/dL (ref 8.9–10.3)
Chloride: 95 mmol/L — ABNORMAL LOW (ref 98–111)
Creatinine: 0.91 mg/dL (ref 0.44–1.00)
GFR, Estimated: >60 mL/min (ref >60)
Glucose, Bld: 90 mg/dL (ref 70–99)
Potassium: 3.5 mmol/L (ref 3.5–5.1)
Sodium: 131 mmol/L — ABNORMAL LOW (ref 135–145)
Total Bilirubin: 0.9 mg/dL (ref 0.3–1.2)
Total Protein: 7.1 g/dL (ref 6.5–8.1)

## 2022-10-22 LAB — VITAMIN B12: Vitamin B-12: 1108 pg/mL — ABNORMAL HIGH (ref 180–914)

## 2022-10-26 ENCOUNTER — Telehealth: Payer: Self-pay

## 2022-10-26 NOTE — Telephone Encounter (Addendum)
Called patient and relayed the message below as per Santiago Glad NP. Patient voiced full understanding.   ----- Message from Pollyann Samples sent at 10/26/2022  8:02 AM EDT ----- Please let pt know labs, B12 is elevated so she can reduce oral B12 to MWF. CBC is stable, and Na is low. Encourage her to hydrate and infection precautions. I'll contact her after mammo/US.   Thanks, Clayborn Heron NP

## 2022-10-27 DIAGNOSIS — R059 Cough, unspecified: Secondary | ICD-10-CM | POA: Diagnosis not present

## 2022-10-27 DIAGNOSIS — I10 Essential (primary) hypertension: Secondary | ICD-10-CM | POA: Diagnosis not present

## 2022-10-27 DIAGNOSIS — Z6829 Body mass index (BMI) 29.0-29.9, adult: Secondary | ICD-10-CM | POA: Diagnosis not present

## 2022-10-30 ENCOUNTER — Ambulatory Visit
Admission: RE | Admit: 2022-10-30 | Discharge: 2022-10-30 | Disposition: A | Discharge status: Home / Self Care | Payer: Medicare Other | Source: Ambulatory Visit | Attending: Nurse Practitioner | Admitting: Nurse Practitioner

## 2022-10-30 DIAGNOSIS — C50212 Malignant neoplasm of upper-inner quadrant of left female breast: Secondary | ICD-10-CM

## 2022-10-30 DIAGNOSIS — N6459 Other signs and symptoms in breast: Secondary | ICD-10-CM

## 2022-10-30 DIAGNOSIS — Z853 Personal history of malignant neoplasm of breast: Secondary | ICD-10-CM | POA: Diagnosis not present

## 2022-10-30 DIAGNOSIS — Z9012 Acquired absence of left breast and nipple: Secondary | ICD-10-CM | POA: Diagnosis not present

## 2022-11-03 ENCOUNTER — Telehealth: Payer: Self-pay

## 2022-11-03 NOTE — Progress Notes (Signed)
Remote pacemaker transmission.   

## 2022-11-03 NOTE — Telephone Encounter (Signed)
Notified the pt of message below. Pt verbalized understanding.  Rondel Jumbo, CMA

## 2022-11-03 NOTE — Telephone Encounter (Signed)
-----   Message from Pollyann Samples sent at 11/03/2022 11:58 AM EDT ----- Please call patient, let her know R mammo/US negative, no concern at the upper outer breast, very dense tissue as we know. Continue surveillance. See me in 1 year as we discussed, or sooner if needed.   Thanks, Clayborn Heron NP

## 2022-11-12 DIAGNOSIS — I10 Essential (primary) hypertension: Secondary | ICD-10-CM | POA: Diagnosis not present

## 2022-11-12 DIAGNOSIS — E7849 Other hyperlipidemia: Secondary | ICD-10-CM | POA: Diagnosis not present

## 2022-11-12 DIAGNOSIS — E876 Hypokalemia: Secondary | ICD-10-CM | POA: Diagnosis not present

## 2022-11-12 DIAGNOSIS — E1165 Type 2 diabetes mellitus with hyperglycemia: Secondary | ICD-10-CM | POA: Diagnosis not present

## 2022-11-19 DIAGNOSIS — E7801 Familial hypercholesterolemia: Secondary | ICD-10-CM | POA: Diagnosis not present

## 2022-11-19 DIAGNOSIS — R059 Cough, unspecified: Secondary | ICD-10-CM | POA: Diagnosis not present

## 2022-11-19 DIAGNOSIS — K5900 Constipation, unspecified: Secondary | ICD-10-CM | POA: Diagnosis not present

## 2022-11-19 DIAGNOSIS — Z23 Encounter for immunization: Secondary | ICD-10-CM | POA: Diagnosis not present

## 2022-11-19 DIAGNOSIS — I700 Atherosclerosis of aorta: Secondary | ICD-10-CM | POA: Diagnosis not present

## 2022-11-19 DIAGNOSIS — E1165 Type 2 diabetes mellitus with hyperglycemia: Secondary | ICD-10-CM | POA: Diagnosis not present

## 2022-11-19 DIAGNOSIS — E7849 Other hyperlipidemia: Secondary | ICD-10-CM | POA: Diagnosis not present

## 2022-11-19 DIAGNOSIS — I10 Essential (primary) hypertension: Secondary | ICD-10-CM | POA: Diagnosis not present

## 2022-11-19 DIAGNOSIS — R490 Dysphonia: Secondary | ICD-10-CM | POA: Diagnosis not present

## 2022-11-19 DIAGNOSIS — R252 Cramp and spasm: Secondary | ICD-10-CM | POA: Diagnosis not present

## 2022-11-19 DIAGNOSIS — M109 Gout, unspecified: Secondary | ICD-10-CM | POA: Diagnosis not present

## 2022-11-19 DIAGNOSIS — E871 Hypo-osmolality and hyponatremia: Secondary | ICD-10-CM | POA: Diagnosis not present

## 2022-11-25 NOTE — Progress Notes (Signed)
Penny Huynh, female    DOB: 1933-09-27    MRN: [id number]   Brief patient profile:  86  yobf [place] quit smoking 1990 with episodes  of ? AB rx by Penny Huynh with saba prn and ?used the same device for 30 years)    referred to pulmonary clinic in   11/26/2022 by Penny Huynh  for recurrent cough that feels the same as what she used to have but this time came and stayed      History of Present Illness  11/26/2022  Pulmonary/ 1st office eval/ Penny Huynh / [facility] off fosamax x about 3 weeks and just starting protonix  Chief Complaint  Patient presents with   Establish Care    Mild intermittent asthma without complication; last OV 10/17/20 Penny Huynh  Dyspnea:  up to 2 miles per day with cane /gets  tired on inclines  Cough: more daytime  minimally productive mostly white / chokes her at times Sleep: flat bed / one pillow no noct or  am flare  SABA use: twice daily  02: none  Cough drops seem to help temporarily as does prednisone   No obvious day to day or daytime pattern/variability or assoc excess/ purulent sputum or mucus plugs or hemoptysis or cp or chest tightness, subjective wheeze or overt sinus or hb symptoms.    Also denies any obvious fluctuation of symptoms with weather or environmental changes or other aggravating or alleviating factors except as outlined above   No unusual exposure hx or h/o childhood pna/ asthma or knowledge of premature birth.  Current Allergies, Complete Past Medical History, Past Surgical History, Family History, and Social History were reviewed in Penny Huynh record.  ROS  The following are not active complaints unless bolded Hoarseness, sore throat/globus sensation, dysphagia, dental problems, itching, sneezing,  nasal congestion or discharge of excess mucus or purulent secretions, ear ache,   fever, chills, sweats, unintended wt loss or wt gain, classically pleuritic or exertional cp,   orthopnea pnd or arm/hand swelling  or leg swelling, presyncope, palpitations, abdominal pain, anorexia, nausea, vomiting, diarrhea  or change in bowel habits or change in bladder habits, change in stools or change in urine, dysuria, hematuria,  rash, arthralgias, visual complaints, headache, numbness, weakness or ataxia or problems with walking or coordination,  change in mood or  memory.              Outpatient Medications Prior to Visit  Medication Sig Dispense Refill   albuterol (VENTOLIN HFA) 108 (90 Base) MCG/ACT inhaler Inhale 1 puff into the lungs every 6 (six) hours as needed for wheezing or shortness of breath.     aspirin 81 MG EC tablet Take 81 mg by mouth at bedtime.     chlorthalidone (HYGROTON) 25 MG tablet Take 25 mg by mouth daily.     Coenzyme Q10 (COQ10) 100 MG CAPS Take 100 mg by mouth daily.     meloxicam (MOBIC) 15 MG tablet Take 15 mg by mouth daily.     metFORMIN (GLUCOPHAGE) 500 MG tablet Take 1,000 mg by mouth daily.     pantoprazole (PROTONIX) 40 MG tablet Take 40 mg by mouth daily.     pregabalin (LYRICA) 150 MG capsule TAKE ONE CAPSULE BY MOUTH TWICE A DAY 60 capsule 2   rosuvastatin (CRESTOR) 5 MG tablet Take 5 mg by mouth every other day.     vitamin B-12 (CYANOCOBALAMIN) 1000 MCG tablet Take 1,000 mcg  by mouth daily.     Vitamin D, Cholecalciferol, 25 MCG (1000 UT) CAPS Take 25 mcg by mouth daily.     amLODipine (NORVASC) 5 MG tablet Take 1 tablet (5 mg total) by mouth daily. 90 tablet 3   alendronate (FOSAMAX) 70 MG tablet Take 70 mg by mouth once a week.     anastrozole (ARIMIDEX) 1 MG tablet Take 1 tablet (1 mg total) by mouth daily. 90 tablet 3   cyclobenzaprine (FLEXERIL) 10 MG tablet Take 5-10 mg by mouth at bedtime as needed. As directed     latanoprost (XALATAN) 0.005 % ophthalmic solution Place 1 drop into both eyes at bedtime.     losartan (COZAAR) 100 MG tablet Take 100 mg by mouth daily.     No facility-administered medications prior to visit.     Past Medical History:  Diagnosis Date   Arthritis    "maybe a little in my left knee" (02/23/2017)   Asthmatic bronchitis    Cancer of left breast (HCC) 2019   Coronary atherosclerosis    Minor nonobstructive CAD at cardiac catheterization 2009   Dyslipidemia    Femoral bruit 7/08   With normal ABIs   Hypertension    Iliac artery aneurysm (HCC)    Lower extremity edema    Chronic   Pneumonia ~ 1950   in 8th grade   Type II diabetes mellitus (HCC)       Objective:     BP (!) 153/77   Pulse 76   Ht 5' 4.5" (1.638 m)   Wt 165 lb (74.8 kg)   SpO2 98%   BMI 27.88 kg/m   SpO2: 98 %  Amb very sharp pleasant edlery bf nad   HEENT : Oropharynx  nl      Nasal turbinates nl    NECK :  without  apparent JVD/ palpable Nodes/TM    LUNGS: no acc muscle use,  Nl contour chest which is clear to A and P bilaterally without cough on insp or exp maneuvers   CV:  RRR  no s3 or murmur or increase in P2, and no edema   ABD:  soft and nontender with nl inspiratory excursion in the supine position. No bruits or organomegaly appreciated   MS:  Nl gait/ ext warm without deformities Or obvious joint restrictions  calf tenderness, cyanosis or clubbing    SKIN: warm and dry without lesions    NEURO:  alert, approp, nl sensorium with  no motor or cerebellar deficits apparent.    I personally reviewed images and agree with radiology impression as follows:  CXR:   10/27/22  1. No acute chest findings or explanation for cough. 2. Aortic Atherosclerosis (ICD10-I70.0).  My review: low lung vol/ CM/ mild T kyphosis    Assessment   Chronic cough Onset winter 2024 ? while on fosamax > rx ppi 11/26/2022   Although response to albuterol and prednisone suggest asthma, much more likely this is Upper airway cough syndrome (previously labeled PNDS),  is so named because it's frequently impossible to sort out how much is  CR/sinusitis with freq throat clearing (which can be related to  primary GERD)   vs  causing  secondary (" extra esophageal")  GERD from wide swings in gastric pressure that occur with throat clearing, often  promoting self use of mint and menthol lozenges that reduce the lower esophageal sphincter tone and exacerbate the problem further in a cyclical fashion.   These are the same pts (  now being labeled as having "irritable larynx syndrome" by some cough centers) who not infrequently have a history of having failed to tolerate ace inhibitors,  dry powder inhalers or biphosphonates or report having atypical/extraesophageal reflux symptoms that don't respond to standard doses of PPI  and are easily confused as having aecopd or asthma flares by even experienced allergists/ pulmonologists (myself included).   Rec  Gerd diet Max acid suppression x 6 weeks  Be sure not taking fosamax for now  Depomedrol 120 mg IM >>>  in case of component of Th-2 driven upper or lower airways inflammation (if cough responds short term only to relapse before return while will on full rx for uacs (as above), then  that would point to allergic rhinitis/ asthma or eos bronchitis as alternative dx)   Discussed in detail all the  indications, usual  risks and alternatives  relative to the benefits with patient who agrees to proceed with Rx as outlined.             Each maintenance medication was reviewed in detail including emphasizing most importantly the difference between maintenance and prns and under what circumstances the prns are to be triggered using an action Huynh format where appropriate.  Total time for H and P, chart review, counseling, reviewing hfa device(s) and generating customized AVS unique to this office visit / same day charting > for new pt with refractory respiratory  symptoms of uncertain etiology           Sandrea Hughs, MD 11/26/2022

## 2022-11-26 ENCOUNTER — Ambulatory Visit (INDEPENDENT_AMBULATORY_CARE_PROVIDER_SITE_OTHER): Payer: Medicare Other | Admitting: Internal Medicine

## 2022-11-26 ENCOUNTER — Encounter: Payer: Self-pay | Admitting: Internal Medicine

## 2022-11-26 VITALS — BP 153/77 | HR 76 | Ht 64.5 in | Wt 165.0 lb

## 2022-11-26 DIAGNOSIS — R053 Chronic cough: Secondary | ICD-10-CM

## 2022-11-26 DIAGNOSIS — M5416 Radiculopathy, lumbar region: Secondary | ICD-10-CM | POA: Insufficient documentation

## 2022-11-26 MED ORDER — METHYLPREDNISOLONE ACETATE 80 MG/ML IJ SUSP
120.0000 mg | Freq: Once | INTRAMUSCULAR | Status: AC
  Administered 2022-11-26: 120 mg via INTRAMUSCULAR

## 2022-11-26 NOTE — Patient Instructions (Addendum)
Be sure you are not taking Alendronate (fosamax)   Change Pantoprazole (protonix) 40 mg   Take  30-60 min before first meal of the day and Pepcid (famotidine)  20 mg after supper until return to office - this is the best way to tell whether stomach acid is contributing to your problem.    GERD (REFLUX)  is an extremely common cause of respiratory symptoms just like yours , many times with no obvious heartburn at all.    It can be treated with medication, but also with lifestyle changes including elevation of the head of your bed (ideally with 6 -8inch blocks under the headboard of your bed),  Smoking cessation, avoidance of late meals, excessive alcohol, and avoid fatty foods, chocolate, peppermint, colas, red wine, and acidic juices such as orange juice.  NO MINT OR MENTHOL PRODUCTS SO NO COUGH DROPS  USE SUGARLESS CANDY INSTEAD (Jolley ranchers or Stover's or Life Savers) or even ice chips will also do - the key is to swallow to prevent all throat clearing. NO OIL BASED VITAMINS - use powdered substitutes.  Avoid fish oil when coughing.   Depomedrol 120 mg IM  - if this is an allergy it will come back after about a week   For cough/ congestion >  Mucinex dm  up to maximum of  1200 mg every 12 hours as needed  Only use your albuterol as a rescue medication to be used if you can't stop coughing or catch your breath by resting  - The less you use it, the better it will work when you need it. - Ok to use up to 2 puffs  every 4 hours if you must but call for immediate appointment if use goes up over your usual need - Don't leave home without it !!  (think of it like the spare tire for your car)   Work on inhaler technique:  relax and gently blow all the way out then take a nice smooth full deep breath back in, triggering the inhaler at same time you start breathing in.  Hold breath in for at least  5 seconds if you can.  Rinse and gargle with water when done.  If mouth or throat bother you at all,   try brushing teeth/gums/tongue with arm and hammer toothpaste/ make a slurry and gargle and spit out.   Please schedule a follow up office visit in 6 weeks, call sooner if needed with all medications /inhalers/ solutions in hand so we can verify exactly what you are taking. This includes all medications from all doctors and over the counters - you can bring the medications you stopped in a separate bag

## 2022-11-26 NOTE — Assessment & Plan Note (Addendum)
Onset winter 2024 ? while on fosamax > rx ppi 11/26/2022   Although response to albuterol and prednisone suggest asthma, much more likely this is Upper airway cough syndrome (previously labeled PNDS),  is so named because it's frequently impossible to sort out how much is  CR/sinusitis with freq throat clearing (which can be related to primary GERD)   vs  causing  secondary (" extra esophageal")  GERD from wide swings in gastric pressure that occur with throat clearing, often  promoting self use of mint and menthol lozenges that reduce the lower esophageal sphincter tone and exacerbate the problem further in a cyclical fashion.   These are the same pts (now being labeled as having "irritable larynx syndrome" by some cough centers) who not infrequently have a history of having failed to tolerate ace inhibitors,  dry powder inhalers or biphosphonates or report having atypical/extraesophageal reflux symptoms that don't respond to standard doses of PPI  and are easily confused as having aecopd or asthma flares by even experienced allergists/ pulmonologists (myself included).   Rec  Gerd diet Max acid suppression x 6 weeks  Be sure not taking fosamax for now  Depomedrol 120 mg IM >>>  in case of component of Th-2 driven upper or lower airways inflammation (if cough responds short term only to relapse before return while will on full rx for uacs (as above), then  that would point to allergic rhinitis/ asthma or eos bronchitis as alternative dx)   Discussed in detail all the  indications, usual  risks and alternatives  relative to the benefits with patient who agrees to proceed with Rx as outlined.             Each maintenance medication was reviewed in detail including emphasizing most importantly the difference between maintenance and prns and under what circumstances the prns are to be triggered using an action plan format where appropriate.  Total time for H and P, chart review, counseling, reviewing  hfa device(s) and generating customized AVS unique to this office visit / same day charting > for new pt with refractory respiratory  symptoms of uncertain etiology

## 2022-11-30 ENCOUNTER — Telehealth: Payer: Self-pay | Admitting: Internal Medicine

## 2022-11-30 NOTE — Telephone Encounter (Signed)
PT wants refill of Fomotidine   [facility] in [place]

## 2022-12-02 MED ORDER — FAMOTIDINE 20 MG PO TABS: 20.0000 mg | ORAL_TABLET | Freq: Two times a day (BID) | ORAL | Status: DC

## 2022-12-02 NOTE — Telephone Encounter (Signed)
Prescription has been sent in via last ov notes. Pt has been contacted. Nfn

## 2022-12-29 DIAGNOSIS — H401131 Primary open-angle glaucoma, bilateral, mild stage: Secondary | ICD-10-CM | POA: Diagnosis not present

## 2022-12-30 DIAGNOSIS — M47816 Spondylosis without myelopathy or radiculopathy, lumbar region: Secondary | ICD-10-CM | POA: Diagnosis not present

## 2023-01-06 NOTE — Progress Notes (Signed)
Penny Huynh, female    DOB: 04/17/1933    MRN: [id number]   Brief patient profile:  59  yobf [place] quit smoking 1990 with episodes  of ? AB rx by Weyman Pedro with saba prn and (?used the same device for 30 years)    referred to pulmonary clinic in [place]  11/26/2022 by Dr Neita Carp  for recurrent cough that feels the same as what she used to have but this time "came and stayed " fall 2024      History of Present Illness  11/26/2022  Pulmonary/ 1st office eval/ Sherene S[facility] / [facility]  just starting protonix  Chief Complaint  Patient presents with   Establish Care    Mild intermittent asthma without complication; last OV 10/17/20 [facility]  Dyspnea:  up to 2 miles per day with cane /gets  tired on inclines  Cough: more daytime  minimally productive mostly white / chokes her at times Sleep: flat bed / one pillow no noct or  am flare  SABA use: twice daily  02: none  Cough drops seem to help temporarily as does prednisone Recs Be sure you are not taking Alendronate (fosamax)  Change Pantoprazole (protonix) 40 mg   Take  30-60 min before first meal of the day and Pepcid (famotidine)  20 mg after supper until return to office - this is the best way to tell whether stomach acid is contributing to your problem.   GERD diet reviewed, bed blocks rec  Depomedrol 120 mg IM  - if this is an allergy it will come back after about a week  For cough/ congestion >  Mucinex dm  up to maximum of  1200 mg every 12 hours as needed Only use your albuterol as a rescue medication Work on inhaler technique:   Please schedule a follow up office visit in 6 weeks, call sooner if needed with all medications /inhalers/ solutions in han     01/07/2023  f/u ov/[facility]rie Wale re: ?UACS maint on ? did  bring meds but not sure she's taking them. Concerned cancer is back in her lungs  Chief Complaint  Patient presents with   Cough   Dyspnea:  slow walk anywhere.  Cough: sporadic /  mildly congested sounding spont cough  Sleeping: bed is flat/ sev pillows  resp cc  SABA use: 3-4 x day but never re or pre challenges  02: none  Still using oil based vitamins/fish oil      No obvious day to day or daytime variability or assoc excess/ purulent sputum or mucus plugs or hemoptysis or cp or chest tightness, subjective wheeze or overt   hb symptoms.    Also denies any obvious fluctuation of symptoms with weather or environmental changes or other aggravating or alleviating factors except as outlined above   No unusual exposure hx or h/o childhood pna/ asthma or knowledge of premature birth.  Current Allergies, Complete Past Medical History, Past Surgical History, Family History, and Social History were reviewed in [employer] record.  ROS  The following are not active complaints unless bolded Hoarseness, sore throat, dysphagia, dental problems, itching, sneezing,  nasal congestion or discharge of excess mucus or purulent secretions, ear ache,   fever, chills, sweats, unintended wt loss or wt gain, classically pleuritic or exertional cp,  orthopnea pnd or arm/hand swelling  or leg swelling, presyncope, palpitations, abdominal pain, anorexia, nausea, vomiting, diarrhea  or change in bowel habits or  change in bladder habits, change in stools or change in urine, dysuria, hematuria,  rash, arthralgias, visual complaints, headache, numbness, weakness or ataxia or problems with walking or coordination,  change in mood or  memory.        Current Meds  Medication Sig   acetaminophen (TYLENOL) 325 MG tablet Take 650 mg by mouth every 6 (six) hours as needed.   albuterol (VENTOLIN HFA) 108 (90 Base) MCG/ACT inhaler Inhale 1 puff into the lungs every 6 (six) hours as needed for wheezing or shortness of breath.   alendronate (FOSAMAX) 70 MG tablet Take 70 mg by mouth once a week. Take with a full glass of water on an empty stomach.   aspirin 81 MG EC tablet Take 81 mg  by mouth at bedtime.   chlorthalidone (HYGROTON) 25 MG tablet Take 25 mg by mouth daily.   Coenzyme Q10 (COQ10) 100 MG CAPS Take 100 mg by mouth daily.   cyclobenzaprine (FLEXERIL) 10 MG tablet Take 10 mg by mouth at bedtime.   dextromethorphan-guaiFENesin (MUCINEX DM) 30-600 MG 12hr tablet Take 1 tablet by mouth 2 (two) times daily.   famotidine (PEPCID) 20 MG tablet Take 1 tablet (20 mg total) by mouth 2 (two) times daily.   meloxicam (MOBIC) 15 MG tablet Take 15 mg by mouth daily.   metFORMIN (GLUCOPHAGE) 500 MG tablet Take 1,000 mg by mouth daily.   pantoprazole (PROTONIX) 40 MG tablet Take 40 mg by mouth daily.   pregabalin (LYRICA) 150 MG capsule TAKE ONE CAPSULE BY MOUTH TWICE A DAY   rosuvastatin (CRESTOR) 5 MG tablet Take 5 mg by mouth every other day.   vitamin B-12 (CYANOCOBALAMIN) 1000 MCG tablet Take 1,000 mcg by mouth daily.   Vitamin D, Cholecalciferol, 25 MCG (1000 UT) CAPS Take 25 mcg by mouth daily.            Past Medical History:  Diagnosis Date   Arthritis    "maybe a little in my left knee" (02/23/2017)   Asthmatic bronchitis    Cancer of left breast (HCC) 2019   Coronary atherosclerosis    Minor nonobstructive CAD at cardiac catheterization 2009   Dyslipidemia    Femoral bruit 7/08   With normal ABIs   Hypertension    Iliac artery aneurysm [facility])    Lower extremity edema    Chronic   Pneumonia ~ 1950   in 8th grade   Type II diabetes mellitus (HCC)       Objective:    wts   01/07/2023        161   11/26/22 165 lb (74.8 kg)  10/22/22 164 lb 8 oz (74.6 kg)  04/09/22 163 lb 9 oz (74.2 kg)    Vital signs reviewed  01/07/2023  - Note at rest 02 sats  96% on RA   General appearance:    amb bf very confused with details of care/ slt rattle with cogh       HEENT : Oropharynx  clear/ no pnd or cobblestoning      Nasal turbinates nl    NECK :  without  apparent JVD/ palpable Nodes/TM    LUNGS: no acc muscle use,  Nl contour chest which is clear to A  and P bilaterally without cough on insp or exp maneuvers   CV:  RRR  no s3 or murmur or increase in P2, and no edema   ABD:  soft and nontender with nl inspiratory excursion in the supine position. No  bruits or organomegaly appreciated   MS:  Nl gait/ ext warm without deformities Or obvious joint restrictions  calf tenderness, cyanosis or clubbing    SKIN: warm and dry without lesions    NEURO:  alert, approp, nl sensorium with  no motor or cerebellar deficits apparent.    I personally reviewed images and agree with radiology impression as follows:  CXR:   10/27/22 (in system)  1. No acute chest findings or explanation for cough. 2. Aortic Atherosclerosis (ICD10-I70.0).  My review: low lung vol/ CM/ mild T kyphosis    Assessment

## 2023-01-07 ENCOUNTER — Ambulatory Visit: Payer: Medicare Other | Admitting: Internal Medicine

## 2023-01-07 ENCOUNTER — Encounter: Payer: Self-pay | Admitting: Internal Medicine

## 2023-01-07 VITALS — BP 165/86 | HR 83 | Ht 64.5 in | Wt 161.0 lb

## 2023-01-07 DIAGNOSIS — R053 Chronic cough: Secondary | ICD-10-CM | POA: Diagnosis not present

## 2023-01-07 MED ORDER — METHYLPREDNISOLONE ACETATE 80 MG/ML IJ SUSP
120.0000 mg | Freq: Once | INTRAMUSCULAR | Status: AC
  Administered 2023-01-07: 120 mg via INTRAMUSCULAR

## 2023-01-07 NOTE — Patient Instructions (Addendum)
Use sugarless hard rock candy  to try prevent the urge to clear your throat.  Stop Alendronate for  the next 3 months and no oil based vitamins   Continue  Pantoprazole (protonix) 40 mg   Take  30-60 min before first meal of the day and Pepcid (famotidine)  20 mg after supper until return to office - this is the best way to tell whether stomach acid is contributing to your problem.    Depomedrol 120 mg IM   Also  Ok to try albuterol 15 min before an activity (on alternating days)  that you know would usually make you short of breath and see if it makes any difference and if makes none then don't take albuterol after activity unless you can't catch your breath as this means it's the resting that helps, not the albuterol.     Please remember to go to the lab department   for your tests - we will call you with the results when they are available.      Please schedule a follow up visit in 3 months but call sooner if needed

## 2023-01-08 NOTE — Assessment & Plan Note (Addendum)
Onset winter 2024 ? while on fosamax > rx ppi 11/26/2022 and leave off fosamax  - 01/07/2023 still on fosamax/ still coughing and thoroughly confused with details of care and says has been on fosamax chronically ("for years") so rec leave off and just use saba prn  - Allergy screen 01/07/2023 >  Eos 0. /  IgE    Still favor Upper airway cough syndrome (previously labeled PNDS),  is so named because it's frequently impossible to sort out how much is  CR/sinusitis with freq throat clearing (which can be related to primary GERD)   vs  causing  secondary (" extra esophageal")  GERD from wide swings in gastric pressure that occur with throat clearing, often  promoting self use of mint and menthol lozenges that reduce the lower esophageal sphincter tone and exacerbate the problem further in a cyclical fashion.   These are the same pts (now being labeled as having "irritable larynx syndrome" by some cough centers) who not infrequently have a history of having failed to tolerate ace inhibitors,  dry powder inhalers or biphosphonates or report having atypical/extraesophageal reflux symptoms that don't respond to standard doses of PPI  (ie LPR with Globus sensation as may be the case here ) and are easily confused as having aecopd or asthma flares by even experienced allergists/ pulmonologists (myself included).   Rec Use sugarless hard rock candy  to try prevent the urge to clear your throat.  Stop Alendronate for  the next 3 months and no oil based vitamins   Continue  Pantoprazole (protonix) 40 mg   Take  30-60 min before first meal of the day and Pepcid (famotidine)  20 mg after supper until return to office - this is the best way to tell whether stomach acid is contributing to your problem.    Depomedrol 120 mg IM  >>> in case of component of Th-2 driven upper or lower airways inflammation (if cough responds short term only to relapse before return while will on full rx for uacs (as above), then  that would  point to allergic rhinitis/ asthma or eos bronchitis as alternative dx)   Also  Ok to try albuterol 15 min before an activity (on alternating days)  that you know would usually make you short of breath  Advised: The standardized cough guidelines published in Chest by [place] in 2006 are still the best available and consist of a multiple step process (up to 12!) , not a single office visit,  and are intended  to address this problem logically,  with an alogrithm dependent on response to empiric treatment at  each progressive step  to determine a specific diagnosis with  minimal addtional testing needed. Therefore if adherence is an issue or can't be accurately verified,  it's very unlikely the standard evaluation and treatment will be successful here.    Furthermore, response to therapy (other than acute cough suppression, which should only be used short term with avoidance of narcotic containing cough syrups if possible), can be a gradual process for which the patient is not likely to  perceive immediate benefit.  Unlike going to an eye doctor where the best perscription is almost always the first one and is immediately effective, this is almost never the case in the management of chronic cough syndromes. Therefore the patient needs to commit up front to consistently adhere to recommendations  for up to 6 weeks of therapy directed at the likely underlying problem(s) before the response can be  reasonably evaluated.   Therefore return  in 3 m, sooner if needed, with all meds in hand using a trust but verify approach to confirm accurate Medication  Reconciliation The principal here is that until we are certain that the  patients are doing what we've asked, it makes no sense to ask them to do more.          Each maintenance medication was reviewed in detail including emphasizing most importantly the difference between maintenance and prns and under what circumstances the prns are to be triggered  using an action plan format where appropriate.  Total time for H and P, chart review, counseling, reviewing hfa device(s) and generating customized AVS unique to this office visit / same day charting  > 30 min for multiple  refractory respiratory  symptoms of uncertain etiology

## 2023-01-11 LAB — CBC WITH DIFFERENTIAL/PLATELET
Basophils Absolute: 0.1 10*3/uL (ref 0.0–0.2)
Basos: 2 %
EOS (ABSOLUTE): 0.2 10*3/uL (ref 0.0–0.4)
Eos: 4 %
Hematocrit: 38.2 % (ref 34.0–46.6)
Hemoglobin: 12.6 g/dL (ref 11.1–15.9)
Immature Grans (Abs): 0 10*3/uL (ref 0.0–0.1)
Immature Granulocytes: 0 %
Lymphocytes Absolute: 1.7 10*3/uL (ref 0.7–3.1)
Lymphs: 41 %
MCH: 30.4 pg (ref 26.6–33.0)
MCHC: 33 g/dL (ref 31.5–35.7)
MCV: 92 fL (ref 79–97)
Monocytes Absolute: 0.4 10*3/uL (ref 0.1–0.9)
Monocytes: 11 %
Neutrophils Absolute: 1.7 10*3/uL (ref 1.4–7.0)
Neutrophils: 42 %
Platelets: 264 10*3/uL (ref 150–450)
RBC: 4.14 x10E6/uL (ref 3.77–5.28)
RDW: 11.8 % (ref 11.7–15.4)
WBC: 4.1 10*3/uL (ref 3.4–10.8)

## 2023-01-11 LAB — IGE: IgE (Immunoglobulin E), Serum: 180 [IU]/mL (ref 6–495)

## 2023-01-21 ENCOUNTER — Ambulatory Visit: Payer: Medicare Other

## 2023-01-21 DIAGNOSIS — I442 Atrioventricular block, complete: Secondary | ICD-10-CM | POA: Diagnosis not present

## 2023-01-21 LAB — CUP PACEART REMOTE DEVICE CHECK
Battery Remaining Longevity: 83 mo
Battery Remaining Percentage: 78 %
Battery Voltage: 3.01 V
Brady Statistic AP VP Percent: 25 %
Brady Statistic AP VS Percent: 1 %
Brady Statistic AS VP Percent: 75 %
Brady Statistic AS VS Percent: 1 %
Brady Statistic RA Percent Paced: 24 %
Brady Statistic RV Percent Paced: 99 %
Date Time Interrogation Session: 20241219020019
Implantable Lead Connection Status: 753985
Implantable Lead Connection Status: 753985
Implantable Lead Implant Date: 20220908
Implantable Lead Implant Date: 20220908
Implantable Lead Location: 753859
Implantable Lead Location: 753860
Implantable Pulse Generator Implant Date: 20220908
Lead Channel Impedance Value: 490 Ohm
Lead Channel Impedance Value: 540 Ohm
Lead Channel Pacing Threshold Amplitude: 0.5 V
Lead Channel Pacing Threshold Amplitude: 0.75 V
Lead Channel Pacing Threshold Pulse Width: 0.5 ms
Lead Channel Pacing Threshold Pulse Width: 0.5 ms
Lead Channel Sensing Intrinsic Amplitude: 2.6 mV
Lead Channel Sensing Intrinsic Amplitude: 8.3 mV
Lead Channel Setting Pacing Amplitude: 2 V
Lead Channel Setting Pacing Amplitude: 2.5 V
Lead Channel Setting Pacing Pulse Width: 0.5 ms
Lead Channel Setting Sensing Sensitivity: 2 mV
Pulse Gen Model: 2272
Pulse Gen Serial Number: 6519562

## 2023-01-28 DIAGNOSIS — Z950 Presence of cardiac pacemaker: Secondary | ICD-10-CM | POA: Diagnosis not present

## 2023-01-28 DIAGNOSIS — Z743 Need for continuous supervision: Secondary | ICD-10-CM | POA: Diagnosis not present

## 2023-01-28 DIAGNOSIS — R0602 Shortness of breath: Secondary | ICD-10-CM | POA: Diagnosis not present

## 2023-01-28 DIAGNOSIS — R0789 Other chest pain: Secondary | ICD-10-CM | POA: Diagnosis not present

## 2023-01-28 DIAGNOSIS — R001 Bradycardia, unspecified: Secondary | ICD-10-CM | POA: Diagnosis not present

## 2023-01-28 DIAGNOSIS — R079 Chest pain, unspecified: Secondary | ICD-10-CM | POA: Diagnosis not present

## 2023-01-28 DIAGNOSIS — I10 Essential (primary) hypertension: Secondary | ICD-10-CM | POA: Diagnosis not present

## 2023-01-28 DIAGNOSIS — E119 Type 2 diabetes mellitus without complications: Secondary | ICD-10-CM | POA: Diagnosis not present

## 2023-01-28 DIAGNOSIS — Z853 Personal history of malignant neoplasm of breast: Secondary | ICD-10-CM | POA: Diagnosis not present

## 2023-02-01 ENCOUNTER — Encounter: Payer: Self-pay | Admitting: Hematology

## 2023-02-01 ENCOUNTER — Telehealth: Payer: Self-pay | Admitting: Cardiology

## 2023-02-01 NOTE — Telephone Encounter (Signed)
   Pt c/o of Chest Pain: STAT if active CP, including tightness, pressure, jaw pain, radiating pain to shoulder/upper arm/back, CP unrelieved by Nitro. Symptoms reported of SOB, nausea, vomiting, sweating.  1. Are you having CP right now? Yes   2. Are you experiencing any other symptoms (ex. SOB, nausea, vomiting, sweating)? SOB and   3. Is your CP continuous or coming and going? Continuous   4. Have you taken Nitroglycerin? No   5. How long have you been experiencing CP? Thursday morning   6. If NO CP at time of call then end call with telling Pt to call back or call 911 if Chest pain returns prior to return call from triage team.

## 2023-02-01 NOTE — Telephone Encounter (Signed)
Pt c/o of Chest Pain: STAT if active CP, including tightness, pressure, jaw pain, radiating pain to shoulder/upper arm/back, CP unrelieved by Nitro. Symptoms reported of SOB, nausea, vomiting, sweating.   1. Are you having CP right now? Yes     2. Are you experiencing any other symptoms (ex. SOB, nausea, vomiting, sweating)? SOB and diarrhea   3. Is your CP continuous or coming and going? Continuous     4. Have you taken Nitroglycerin? No     5. How long have you been experiencing CP? Thursday morning     6. If NO CP at time of call then end call with telling Pt to call back or call 911 if Chest pain returns prior to return call from triage team.    Pt went to the ER in [facility] for the chest pain. Please advise.

## 2023-02-01 NOTE — Telephone Encounter (Signed)
Patient states she does not currently have chest pain. She reports it first started Thursday 12/26, waking her up in the middle of the night. She went to ED in [place] and was told she did not have MI but did have mild CHF and was advised to follow-up with cardiologist.  Patient has appt with Sharlene Dory, NP at [facility] office tomorrow 12/31 at 1:00pm. Advised patient on ED precautions and to keep appt tomorrow. Patient verbalized understanding.

## 2023-02-02 ENCOUNTER — Ambulatory Visit: Payer: Medicare Other | Attending: Nurse Practitioner | Admitting: Nurse Practitioner

## 2023-02-02 ENCOUNTER — Encounter: Payer: Self-pay | Admitting: Nurse Practitioner

## 2023-02-02 VITALS — BP 110/58 | HR 75 | Ht 64.0 in | Wt 158.4 lb

## 2023-02-02 DIAGNOSIS — I5032 Chronic diastolic (congestive) heart failure: Secondary | ICD-10-CM | POA: Diagnosis not present

## 2023-02-02 DIAGNOSIS — I2510 Atherosclerotic heart disease of native coronary artery without angina pectoris: Secondary | ICD-10-CM | POA: Insufficient documentation

## 2023-02-02 DIAGNOSIS — R079 Chest pain, unspecified: Secondary | ICD-10-CM | POA: Insufficient documentation

## 2023-02-02 DIAGNOSIS — I10 Essential (primary) hypertension: Secondary | ICD-10-CM | POA: Diagnosis not present

## 2023-02-02 DIAGNOSIS — I442 Atrioventricular block, complete: Secondary | ICD-10-CM | POA: Diagnosis not present

## 2023-02-02 DIAGNOSIS — E785 Hyperlipidemia, unspecified: Secondary | ICD-10-CM | POA: Insufficient documentation

## 2023-02-02 DIAGNOSIS — Z950 Presence of cardiac pacemaker: Secondary | ICD-10-CM | POA: Insufficient documentation

## 2023-02-02 DIAGNOSIS — R2689 Other abnormalities of gait and mobility: Secondary | ICD-10-CM | POA: Diagnosis not present

## 2023-02-02 DIAGNOSIS — R0609 Other forms of dyspnea: Secondary | ICD-10-CM | POA: Diagnosis not present

## 2023-02-02 NOTE — Patient Instructions (Addendum)
 Medication Instructions:  Your physician recommends that you continue on your current medications as directed. Please refer to the Current Medication list given to you today.  Labwork: None   Testing/Procedures: Your physician has requested that you have an echocardiogram. Echocardiography is a painless test that uses sound waves to create images of your heart. It provides your doctor with information about the size and shape of your heart and how well your heart's chambers and valves are working. This procedure takes approximately one hour. There are no restrictions for this procedure. Please do NOT wear cologne, perfume, aftershave, or lotions (deodorant is allowed). Please arrive 15 minutes prior to your appointment time.  Please note: We ask at that you not bring children with you during ultrasound (echo/ vascular) testing. Due to room size and safety concerns, children are not allowed in the ultrasound rooms during exams. Our front office staff cannot provide observation of children in our lobby area while testing is being conducted. An adult accompanying a patient to their appointment will only be allowed in the ultrasound room at the discretion of the ultrasound technician under special circumstances. We apologize for any inconvenience.  Follow-Up: Your physician recommends that you schedule a follow-up appointment in: 2-3 months   Any Other Special Instructions Will Be Listed Below (If Applicable).  If you need a refill on your cardiac medications before your next appointment, please call your pharmacy.

## 2023-02-02 NOTE — Progress Notes (Signed)
 Cardiology Office Note:  .   Date:  02/02/2023 ID:  Penny Huynh Staff, DOB Jan 20, 1934, MRN [id number] PCP: Atilano Deward ORN, MD  [facility] Providers Cardiologist:  Diannah SHAUNNA Maywood, MD    History of Present Illness: Penny   JORY Huynh is a 89 y.o. female with a past medical history of chronic diastolic CHF, nonobstructive CAD, hypertension, complete heart block, s/p PPM insertion, dyslipidemia, type 2 diabetes, lower extremity edema, history of left-sided breast cancer, s/p left total mastectomy in January 2019 and status post radiation therapy, who presents today for ED follow-up.  Previous patient of Dr. Charls, and last seen by him on April 04, 2019.  Had been experiencing exertional dyspnea x 2 months, was no longer taking amlodipine  and BP was elevated in the office.  She was resumed on amlodipine  5 mg daily.  Echocardiogram at that time revealed 60 to 65%, grade 1 DD.  Last seen by Dr. Waddell on January 14, 2021.  She had her pacemaker inserted about 3 months prior to office visit.  She was overall doing well at the time.  Patient visited the ED at [facility]  on January 28, 2023 for chest pain.  Her workup was reassuring.  She was told to follow-up with her cardiologist in regards to potential heart failure.  Our office was contacted yesterday regarding chest pain -see recent telephone note from yesterday.  Today she presents to the office for chest pain/ CHF evaluation.  She states her CP is better since her ED visit, however she admits to DOE ever since her episode. Says she also feels off balance. When describing her CP she points to her epigastric area, with radiation to LUQ and RUQ of abdomen. Denies any upper CP. Denies any palpitations, syncope, presyncope, dizziness, orthopnea, PND, swelling or significant weight changes, acute bleeding, or claudication.  ROS: Negative. See HPI.   Studies Reviewed: Penny   EKG  Interpretation Date/Time:  Tuesday February 02 2023 13:00:04 EST Ventricular Rate:  75 PR Interval:  206 QRS Duration:  202 QT Interval:  472 QTC Calculation: 527 R Axis:   -79  Text Interpretation: Atrial-sensed ventricular-paced rhythm When compared with ECG of 11-Oct-2020 05:38, Vent. rate has increased BY   2 BPM Confirmed by Miriam Norris [id number]) on 02/02/2023 1:14:42 PM    Echo 10/2020:  1. Left ventricular ejection fraction, by estimation, is 55 to 60%. The  left ventricle has normal function. The left ventricle has no regional  wall motion abnormalities. There is moderate left ventricular hypertrophy. Left ventricular diastolic parameters are indeterminate.   2. Right ventricular systolic function is normal. The right ventricular  size is normal. Tricuspid regurgitation signal is inadequate for assessing PA pressure.   3. The mitral valve is normal in structure. Trivial mitral valve  regurgitation. No evidence of mitral stenosis.   4. The aortic valve is tricuspid. Aortic valve regurgitation is not  visualized. Mild aortic valve sclerosis is present, with no evidence of  aortic valve stenosis.   5. The inferior vena cava is normal in size with greater than 50%  respiratory variability, suggesting right atrial pressure of 3 mmHg.  Physical Exam:   VS:  BP (!) 110/58   Pulse 75   Ht 5' 4 (1.626 m)   Wt 158 lb 6.4 oz (71.8 kg)   SpO2 97%   BMI 27.19 kg/m    Wt Readings from Last 3 Encounters:  02/02/23 158 lb 6.4 oz (71.8 kg)  01/07/23 161 lb (73 kg)  11/26/22 165 lb (74.8 kg)    GEN: Well nourished, well developed in no acute distress NECK: No JVD; No carotid bruits CARDIAC: S1/S2, RRR, no murmurs, rubs, gallops RESPIRATORY:  Clear to auscultation without rales, wheezing or rhonchi  ABDOMEN: Soft, non-tender, non-distended EXTREMITIES:  No edema; No deformity   ASSESSMENT AND PLAN: .    Chest pain of uncertain etiology, nonobstructive CAD Atypical in etiology,  etiology sounds GI. Recent workup unremarkable in ED. Lexiscan  in 2017 was normal. Continue Aspirin  and rosuvastatin . Heart healthy diet and regular cardiovascular exercise encouraged. Care and ED precautions discussed.  Chronic diastolic CHF, DOE Stage C, NYHA class II-III symptoms. EF in 2022 55-60%. Euvolemic and well compensated on exam. Recent BNP normal. Will update Echo at this time. No medication changes at this time. Low sodium diet, fluid restriction <2L, and daily weights encouraged. Educated to contact our office for weight gain of 2 lbs overnight or 5 lbs in one week.  HTN BP stable. Discussed to monitor BP at home at least 2 hours after medications and sitting for 5-10 minutes. No medication changes at this time. Heart healthy diet and regular cardiovascular exercise encouraged.   CHB, s/p PPM Recent remote device check revealed normal device function. Denies any issues. Follow-up with EP as scheduled.   5. Dyslipidemia LDL 80 07/2022. Continue rosuvastatin . Heart healthy diet and regular cardiovascular exercise encouraged.     6. Poor balance Most likely related to age/deconditioning. Recommended to f/u with PCP, may benefit from therapy. Care and ED precautions discussed.   Dispo: Follow-up with Dr. Mallipeddi or APP in 2-3 months or sooner if anything changes.   Signed, Almarie Crate, NP

## 2023-02-03 ENCOUNTER — Encounter: Payer: Self-pay | Admitting: Hematology

## 2023-02-04 ENCOUNTER — Ambulatory Visit: Payer: Medicare PPO | Attending: Nurse Practitioner

## 2023-02-04 DIAGNOSIS — R0609 Other forms of dyspnea: Secondary | ICD-10-CM | POA: Diagnosis not present

## 2023-02-05 LAB — ECHOCARDIOGRAM COMPLETE
AR max vel: 2.39 cm2
AV Area VTI: 2.59 cm2
AV Area mean vel: 2.5 cm2
AV Mean grad: 5 mm[Hg]
AV Peak grad: 9.2 mm[Hg]
Ao pk vel: 1.52 m/s
Area-P 1/2: 3.93 cm2
Calc EF: 57.2 %
Est EF: 50
MV VTI: 2.6 cm2
S' Lateral: 3 cm
Single Plane A2C EF: 49 %
Single Plane A4C EF: 59 %

## 2023-02-24 NOTE — Progress Notes (Signed)
Remote pacemaker transmission.   

## 2023-03-21 ENCOUNTER — Other Ambulatory Visit: Payer: Self-pay | Admitting: Physician Assistant

## 2023-03-21 ENCOUNTER — Telehealth: Payer: Self-pay | Admitting: Physician Assistant

## 2023-03-21 MED ORDER — CHLORTHALIDONE 25 MG PO TABS: 12.5000 mg | ORAL_TABLET | Freq: Every day | ORAL | 0 refills | Status: DC

## 2023-03-21 NOTE — Telephone Encounter (Signed)
Patient called because her blood pressure has been unusually elevated today.  She gave me readings of 182/96 before she took her a.m. medications and 179/97 about 2 hours after she took her a.m. medications.  These include amlodipine 5 mg and losartan 100 mg.  She says that she is compliant with her medications and does not miss doses.  Upon reviewing her med list, the losartan 100 mg had been held at 1 time because of cough, but she was told to restart it when her cough did not improve off the medication.  She has been taking it.  She had chlorthalidone 25 mg daily on her list, but that is an old prescription and she is not taking it.  However she remembers that she did well on it.  I restarted the chlorthalidone at 12.5 mg daily.  I requested that she keep a track of her blood pressure and let us know if it does not improve.  I also advised that she would need lab work soon because of being restarted on a diuretic.  She has an appointment coming up with Dr. Sherene Sires on 04/07/2023, perhaps he would be willing to check her labs if her PCP has not done so.  She is not having symptoms from the elevated blood pressure such as dizziness or blurred vision.  She was advised if she gets those, she must seek help.  She is in agreement with the plan of care.  Theodore Demark, PA-C 03/21/2023 2:22 PM

## 2023-03-21 NOTE — Progress Notes (Signed)
 SEE NOTE

## 2023-03-25 ENCOUNTER — Telehealth: Payer: Self-pay | Admitting: Nurse Practitioner

## 2023-03-25 DIAGNOSIS — I10 Essential (primary) hypertension: Secondary | ICD-10-CM

## 2023-03-25 DIAGNOSIS — E876 Hypokalemia: Secondary | ICD-10-CM

## 2023-03-25 NOTE — Telephone Encounter (Signed)
-----   Message from Sharlene Dory sent at 03/24/2023  5:19 PM EST ----- Please arrange BMET for diagnosis of hypertension and would like this drawn in 1-2 weeks.   Thanks!   Best, Sharlene Dory, NP ----- Message ----- From: Jacinta Shoe Sent: 03/21/2023   2:27 PM EST To: Sharlene Dory, NP; Marjo Bicker, MD

## 2023-03-25 NOTE — Telephone Encounter (Signed)
Patient informed and verbalized understanding of plan. Lab order sent to PCP to be drawn

## 2023-03-30 ENCOUNTER — Other Ambulatory Visit: Payer: Self-pay

## 2023-04-04 NOTE — Progress Notes (Deleted)
 Penny Huynh, female    DOB: 1933/05/24    MRN: [id number]   Brief patient profile:  31  yobf [place] quit smoking 1990 with episodes  of ? AB rx by Weyman Pedro with saba prn and (?used the same device for 30 years)    referred to pulmonary clinic in [place]  11/26/2022 by Dr Neita Carp  for recurrent cough that feels the same as what she used to have but this time "came and stayed " fall 2024      History of Present Illness  11/26/2022  Pulmonary/ 1st office eval/ Penny Huynh / Penny Huynh Office maint  just starting protonix  Chief Complaint  Patient presents with   Establish Care    Mild intermittent asthma without complication; last OV 10/17/20 [facility]  Dyspnea:  up to 2 miles per day with cane /gets  tired on inclines  Cough: more daytime  minimally productive mostly white / chokes her at times Sleep: flat bed / one pillow no noct or  am flare  SABA use: twice daily  02: none  Cough drops seem to help temporarily as does prednisone Recs Be sure you are not taking Alendronate (fosamax)  Change Pantoprazole (protonix) 40 mg   Take  30-60 min before first meal of the day and Pepcid (famotidine)  20 mg after supper until return to office - this is the best way to tell whether stomach acid is contributing to your problem.   GERD diet reviewed, bed blocks rec  Depomedrol 120 mg IM  - if this is an allergy it will come back after about a week  For cough/ congestion >  Mucinex dm  up to maximum of  1200 mg every 12 hours as needed Only use your albuterol as a rescue medication Work on inhaler technique:   Please schedule a follow up office visit in 6 weeks, call sooner if needed with all medications /inhalers/ solutions in han     01/07/2023  f/u ov/Penny Huynh office/Penny Huynh re: ?UACS maint on ? did  bring meds but not sure she's taking them. Concerned cancer is back in her lungs  Chief Complaint  Patient presents with   Cough   Dyspnea:  slow walk anywhere.  Cough: sporadic /  mildly congested sounding spont cough  Sleeping: bed is flat/ sev pillows  resp cc  SABA use: 3-4 x day but never re or pre challenges  02: none  Still using oil based vitamins/fish oil  Rec     04/07/2023  f/u ov/Penny Huynh office/Penny Huynh re: *** maint on ***  No chief complaint on file.   Dyspnea:  *** Cough: *** Sleeping: ***   resp cc  SABA use: *** 02: ***  Lung cancer screening: ***   No obvious day to day or daytime variability or assoc excess/ purulent sputum or mucus plugs or hemoptysis or cp or chest tightness, subjective wheeze or overt sinus or hb symptoms.    Also denies any obvious fluctuation of symptoms with weather or environmental changes or other aggravating or alleviating factors except as outlined above   No unusual exposure hx or h/o childhood pna/ asthma or knowledge of premature birth.  Current Allergies, Complete Past Medical History, Past Surgical History, Family History, and Social History were reviewed in [employer] record.  ROS  The following are not active complaints unless bolded Hoarseness, sore throat, dysphagia, dental problems, itching, sneezing,  nasal congestion or discharge of excess mucus or purulent secretions,  ear ache,   fever, chills, sweats, unintended wt loss or wt gain, classically pleuritic or exertional cp,  orthopnea pnd or arm/hand swelling  or leg swelling, presyncope, palpitations, abdominal pain, anorexia, nausea, vomiting, diarrhea  or change in bowel habits or change in bladder habits, change in stools or change in urine, dysuria, hematuria,  rash, arthralgias, visual complaints, headache, numbness, weakness or ataxia or problems with walking or coordination,  change in mood or  memory.        No outpatient medications have been marked as taking for the 04/07/23 encounter (Appointment) with Nyoka Cowden, MD.            Past Medical History:  Diagnosis Date   Arthritis    "maybe a little in my left  knee" (02/23/2017)   Asthmatic bronchitis    Cancer of left breast (HCC) 2019   Coronary atherosclerosis    Minor nonobstructive CAD at cardiac catheterization 2009   Dyslipidemia    Femoral bruit 7/08   With normal ABIs   Hypertension    Iliac artery aneurysm [facility])    Lower extremity edema    Chronic   Pneumonia ~ 1950   in 8th grade   Type II diabetes mellitus (HCC)       Objective:    wts   04/07/2023           ***  01/07/2023        161   11/26/22 165 lb (74.8 kg)  10/22/22 164 lb 8 oz (74.6 kg)  04/09/22 163 lb 9 oz (74.2 kg)     Vital signs reviewed  04/07/2023  - Note at rest 02 sats  ***% on ***   General appearance:    ***       I personally reviewed images and agree with radiology impression as follows:  CXR:   10/27/22 (in system)  1. No acute chest findings or explanation for cough. 2. Aortic Atherosclerosis (ICD10-I70.0).  My review: low lung vol/ CM/ mild T kyphosis    Assessment

## 2023-04-06 ENCOUNTER — Telehealth: Payer: Self-pay | Admitting: Internal Medicine
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
## 2023-04-07 ENCOUNTER — Ambulatory Visit: Payer: Medicare Other | Admitting: Internal Medicine

## 2023-04-22 ENCOUNTER — Ambulatory Visit: Payer: Medicare Other

## 2023-04-22 DIAGNOSIS — I442 Atrioventricular block, complete: Secondary | ICD-10-CM

## 2023-04-23 LAB — CUP PACEART REMOTE DEVICE CHECK
Battery Remaining Longevity: 79 mo
Battery Remaining Percentage: 75 %
Battery Voltage: 3.01 V
Brady Statistic AP VP Percent: 24 %
Brady Statistic AP VS Percent: 1 %
Brady Statistic AS VP Percent: 75 %
Brady Statistic AS VS Percent: 1 %
Brady Statistic RA Percent Paced: 24 %
Brady Statistic RV Percent Paced: 99 %
Date Time Interrogation Session: 20250320020014
Implantable Lead Connection Status: 753985
Implantable Lead Connection Status: 753985
Implantable Lead Implant Date: 20220908
Implantable Lead Implant Date: 20220908
Implantable Lead Location: 753859
Implantable Lead Location: 753860
Implantable Pulse Generator Implant Date: 20220908
Lead Channel Impedance Value: 480 Ohm
Lead Channel Impedance Value: 510 Ohm
Lead Channel Pacing Threshold Amplitude: 0.5 V
Lead Channel Pacing Threshold Amplitude: 0.75 V
Lead Channel Pacing Threshold Pulse Width: 0.5 ms
Lead Channel Pacing Threshold Pulse Width: 0.5 ms
Lead Channel Sensing Intrinsic Amplitude: 1.7 mV
Lead Channel Sensing Intrinsic Amplitude: 8.3 mV
Lead Channel Setting Pacing Amplitude: 2 V
Lead Channel Setting Pacing Amplitude: 2.5 V
Lead Channel Setting Pacing Pulse Width: 0.5 ms
Lead Channel Setting Sensing Sensitivity: 2 mV
Pulse Gen Model: 2272
Pulse Gen Serial Number: 6519562

## 2023-04-26 ENCOUNTER — Encounter: Payer: Self-pay | Admitting: Internal Medicine

## 2023-05-01 NOTE — Progress Notes (Unsigned)
 [name], female    DOB: 12-12-1933    MRN: [id number]   Brief patient profile:  11  yobf [place] quit smoking 1990 with episodes  of ? AB rx by [facility] Pedro with saba prn and (?used the same device for 30 years)    referred to pulmonary clinic in [place]  11/26/2022 by Dr Neita Carp  for recurrent cough that feels the same as what she used to have but this time "came and stayed " fall 2024      History of Present Illness  11/26/2022  Pulmonary/ 1st office eval/ Sherene Sires / [facility] maint  just starting protonix  Chief Complaint  Patient presents with   Establish Care    Mild intermittent asthma without complication; last OV 10/17/20 [facility]  Dyspnea:  up to 2 miles per day with cane /gets  tired on inclines  Cough: more daytime  minimally productive mostly white / chokes her at times Sleep: flat bed / one pillow no noct or  am flare  SABA use: twice daily  02: none  Cough drops seem to help temporarily as does prednisone Recs Be sure you are not taking Alendronate (fosamax)  Change Pantoprazole (protonix) 40 mg   Take  30-60 min before first meal of the day and Pepcid (famotidine)  20 mg after supper until return to office - this is the best way to tell whether stomach acid is contributing to your problem.   GERD diet reviewed, bed blocks rec  Depomedrol 120 mg IM  - if this is an allergy it will come back after about a week  For cough/ congestion >  Mucinex dm  up to maximum of  1200 mg every 12 hours as needed Only use your albuterol as a rescue medication Work on inhaler technique:   Please schedule a follow up office visit in 6 weeks, call sooner if needed with all medications /inhalers/ solutions in hand     01/07/2023  f/u ov/[place]/Rocio Wolak re: ?UACS maint on ? did  bring meds but not sure she's taking them. Concerned cancer is back in her lungs  Chief Complaint  Patient presents with   Cough   Dyspnea:  slow walk anywhere.  Cough: sporadic  / mildly congested sounding spont cough  Sleeping: bed is flat/ sev pillows  resp cc  SABA use: 3-4 x day but never re or pre challenges  02: none  Still using oil based vitamins/fish oil  Rec Use sugarless hard rock candy  to try prevent the urge to clear your throat. Stop Alendronate for  the next 3 months and no oil based vitamins  Continue  Pantoprazole (protonix) 40 mg   Take  30-60 min before first meal of the day and Pepcid (famotidine)  20 mg after supper until return to office Depomedrol 120 mg IM  Also  Ok to try albuterol 15 min before an activity (on alternating days)  that you know would usually make you short of breath    Allergy screen 01/07/2023 >  Eos 0.2 /  IgE  180 > allergy eval     05/04/2023  f/u ov/[place] [facility]/Sarahgrace Broman re: UACS  maint on gerd/  Chief Complaint  Patient presents with   Follow-up    Follow up cough has gotten better   Dyspnea:  not limited by breathing  Cough: gone  Sleeping: flat bed, 2 pillows s    resp cc  SABA use: none  02: none  Low back pain radiating to both legs x months    No obvious day to day or daytime variability or assoc excess/ purulent sputum or mucus plugs or hemoptysis or cp or chest tightness, subjective wheeze or overt sinus or hb symptoms.    Also denies any obvious fluctuation of symptoms with weather or environmental changes or other aggravating or alleviating factors except as outlined above   No unusual exposure hx or h/o childhood pna/ asthma or knowledge of premature birth.  Current Allergies, Complete Past Medical History, Past Surgical History, Family History, and Social History were reviewed in [employer] record.  ROS  The following are not active complaints unless bolded Hoarseness, sore throat, dysphagia, dental problems, itching, sneezing,  nasal congestion or discharge of excess mucus or purulent secretions, ear ache,   fever, chills, sweats, unintended wt loss or wt gain, classically  pleuritic or exertional cp,  orthopnea pnd or arm/hand swelling  or leg swelling, presyncope, palpitations, abdominal pain, anorexia, nausea, vomiting, diarrhea  or change in bowel habits or change in bladder habits, change in stools or change in urine, dysuria, hematuria,  rash, arthralgias, visual complaints, headache, numbness, weakness or ataxia or problems with walking -walks with cane  or coordination,  change in mood or  memory.        Current Meds  Medication Sig   albuterol (VENTOLIN HFA) 108 (90 Base) MCG/ACT inhaler Inhale 1 puff into the lungs every 6 (six) hours as needed for wheezing or shortness of breath.   aspirin 81 MG EC tablet Take 81 mg by mouth at bedtime.   chlorthalidone (HYGROTON) 25 MG tablet Take 0.5 tablets (12.5 mg total) by mouth daily.   cyclobenzaprine (FLEXERIL) 10 MG tablet Take 10 mg by mouth at bedtime.   metFORMIN (GLUCOPHAGE) 500 MG tablet Take 1,000 mg by mouth daily.   pregabalin (LYRICA) 150 MG capsule TAKE ONE CAPSULE BY MOUTH TWICE A DAY   rosuvastatin (CRESTOR) 5 MG tablet Take 5 mg by mouth every other day.   vitamin B-12 (CYANOCOBALAMIN) 1000 MCG tablet Take 1,000 mcg by mouth daily.   Vitamin D, Cholecalciferol, 25 MCG (1000 UT) CAPS Take 25 mcg by mouth daily.            Past Medical History:  Diagnosis Date   Arthritis    "maybe a little in my left knee" (02/23/2017)   Asthmatic bronchitis    Cancer of left breast (HCC) 2019   Coronary atherosclerosis    Minor nonobstructive CAD at cardiac catheterization 2009   Dyslipidemia    Femoral bruit 7/08   With normal ABIs   Hypertension    Iliac artery aneurysm [facility])    Lower extremity edema    Chronic   Pneumonia ~ 1950   in 8th grade   Type II diabetes mellitus (HCC)       Objective:    wts   05/04/2023          164  01/07/2023        161   11/26/22 165 lb (74.8 kg)  10/22/22 164 lb 8 oz (74.6 kg)  04/09/22 163 lb 9 oz (74.2 kg)     Vital signs reviewed  05/04/2023  - Note at  rest 02 sats  92% on RA   General appearance:    somber bf easily confused with details of care   HEENT : Oropharynx  clear      Nasal turbinates nl    NECK :  without  apparent JVD/ palpable Nodes/TM    LUNGS: no acc muscle use,  Nl contour chest which is clear to A and P bilaterally without cough on insp or exp maneuvers   CV:  RRR  no s3 or murmur or increase in P2, and no edema   ABD:  soft and nontender   MS:   ext warm without deformities Or obvious joint restrictions  calf tenderness, cyanosis or clubbing    SKIN: warm and dry without lesions    NEURO:  alert, approp, nl sensorium with  no motor or cerebellar deficits apparent.        Assessment

## 2023-05-04 ENCOUNTER — Encounter: Payer: Self-pay | Admitting: Internal Medicine

## 2023-05-04 ENCOUNTER — Ambulatory Visit: Admitting: Internal Medicine

## 2023-05-04 VITALS — BP 152/67 | HR 80 | Wt 164.2 lb

## 2023-05-04 DIAGNOSIS — R053 Chronic cough: Secondary | ICD-10-CM | POA: Diagnosis not present

## 2023-05-04 MED ORDER — METHYLPREDNISOLONE ACETATE 80 MG/ML IJ SUSP
120.0000 mg | Freq: Once | INTRAMUSCULAR | Status: AC
  Administered 2023-05-04: 120 mg via INTRAMUSCULAR

## 2023-05-04 NOTE — Patient Instructions (Addendum)
 Depomedrol IM 120 mg   Pulmonary follow  up is as needed for cough or difficulty  breathing   Ask Dr Neita Carp for referral to back specialist

## 2023-05-04 NOTE — Addendum Note (Signed)
 Addended by: Winn Jock on: 05/04/2023 04:46 PM   Modules accepted: Orders

## 2023-05-04 NOTE — Assessment & Plan Note (Signed)
 Onset winter 2024 ? while on fosamax > rx ppi 11/26/2022 and leave off fosamax  - 01/07/2023 still on fosamax/ still coughing and thoroughly confused with details of care and says has been on fosamax chronically ("for years") so rec leave off and just use saba prn  - Allergy screen 01/07/2023 >  Eos 0.2 /  IgE  180 > allergy eval  > not done as of 05/04/2023 but all better on return off gerd rx and fosamax   Mainly focused on her chronic back pain which was relieved by last visit's depomedrol 120 IM, at least temporarily   No longer reports any cough or doe off fosamax and gerd rx so pulmonary f/u can be prn   Depomedrol 120 mg IM at pt's request for back pain and f/u with Dr Dian Situ office         Each maintenance medication was reviewed in detail including emphasizing most importantly the difference between maintenance and prns and under what circumstances the prns are to be triggered using an action plan format where appropriate.  Total time for H and P, chart review, counseling,   and generating customized AVS unique to this office visit / same day charting = 

## 2023-05-06 ENCOUNTER — Ambulatory Visit: Payer: Medicare Other | Admitting: Internal Medicine

## 2023-05-10 ENCOUNTER — Ambulatory Visit: Payer: Self-pay | Attending: Internal Medicine | Admitting: Internal Medicine

## 2023-05-10 ENCOUNTER — Encounter: Payer: Self-pay | Admitting: Internal Medicine

## 2023-05-10 ENCOUNTER — Telehealth: Payer: Self-pay | Admitting: Internal Medicine

## 2023-05-10 VITALS — BP 128/60 | HR 63 | Ht 64.0 in | Wt 159.0 lb

## 2023-05-10 DIAGNOSIS — I723 Aneurysm of iliac artery: Secondary | ICD-10-CM

## 2023-05-10 DIAGNOSIS — I739 Peripheral vascular disease, unspecified: Secondary | ICD-10-CM | POA: Diagnosis not present

## 2023-05-10 DIAGNOSIS — Z01810 Encounter for preprocedural cardiovascular examination: Secondary | ICD-10-CM

## 2023-05-10 DIAGNOSIS — I5032 Chronic diastolic (congestive) heart failure: Secondary | ICD-10-CM | POA: Diagnosis not present

## 2023-05-10 MED ORDER — DAPAGLIFLOZIN PROPANEDIOL 10 MG PO TABS: 10.0000 mg | ORAL_TABLET | Freq: Every day | ORAL | 5 refills | Status: DC

## 2023-05-10 NOTE — Patient Instructions (Addendum)
 Medication Instructions:  Your physician has recommended you make the following change in your medication:  Stop taking Chlorthalidone Start taking Farxiga 10 mg once daily  Continue taking all other medications as prescribed   Labwork: BMP in one week at [facility] or [facility]  Testing/Procedures: Your physician has requested that you have an ankle brachial index (ABI). During this test an ultrasound and blood pressure cuff are used to evaluate the arteries that supply the arms and legs with blood. Allow thirty minutes for this exam. There are no restrictions or special instructions.  Please note: We ask at that you not bring children with you during ultrasound (echo/ vascular) testing. Due to room size and safety concerns, children are not allowed in the ultrasound rooms during exams. Our front office staff cannot provide observation of children in our lobby area while testing is being conducted. An adult accompanying a patient to their appointment will only be allowed in the ultrasound room at the discretion of the ultrasound technician under special circumstances. We apologize for any inconvenience.  Non-Cardiac CT Angiography (CTA), is a special type of CT scan that uses a computer to produce multi-dimensional views of major blood vessels throughout the body. In CT angiography, a contrast material is injected through an IV to help visualize the blood vessels   Follow-Up: Your physician recommends that you schedule a follow-up appointment in: 1 year. You will receive a reminder call in about 8 months reminding you to schedule your appointment. If you don't receive this call, please contact our office.   Any Other Special Instructions Will Be Listed Below (If Applicable). Referral for Vascular Surgery   Thank you for choosing [facility]!     If you need a refill on your cardiac medications before your next appointment, please call your pharmacy.

## 2023-05-10 NOTE — Progress Notes (Addendum)
 Cardiology Office Note  Date: 05/10/2023   ID: Penny Huynh, DOB 07-29-1933, MRN [id number]  PCP:  Estanislado Pandy, MD  Cardiologist:  Marjo Bicker, MD Electrophysiologist:  None   History of Present Illness: Penny Huynh is a 89 y.o. 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  Has occasional chest pain. No DOE except with over-exertion. No dizziness, syncope, leg swelling. Has claudication with walking at home.  Past Medical History:  Diagnosis Date   Arthritis    "maybe a little in my left knee" (02/23/2017)   Asthmatic bronchitis    Cancer of left breast (HCC) 2019   Coronary atherosclerosis    Minor nonobstructive CAD at cardiac catheterization 2009   Dyslipidemia    Femoral bruit 7/08   With normal ABIs   Hypertension    Iliac artery aneurysm (HCC)    Lower extremity edema    Chronic   Pneumonia ~ 1950   in 8th grade   Type II diabetes mellitus (HCC)     Past Surgical History:  Procedure Laterality Date   APPENDECTOMY     BREAST BIOPSY Left 02/2017   CARDIAC CATHETERIZATION     "long time ago" (02/23/2017)   MASTECTOMY COMPLETE / SIMPLE W/ SENTINEL NODE BIOPSY Left 02/23/2017   TOTAL MATECTOMY;  LEFT AXILLARY SENTINEL LYMPH NODE BIOPSY ERAS PATHWAY   MASTECTOMY WITH RADIOACTIVE SEED GUIDED EXCISION AND AXILLARY SENTINEL LYMPH NODE BIOPSY Left 02/23/2017   Procedure: LEFT TOTAL MATECTOMY;  LEFT AXILLARY SENTINEL LYMPH NODE BIOPSY ERAS PATHWAY;  Surgeon: Glenna Fellows, MD;  Location: [facility] OR;  Service: General;  Laterality: Left;   PACEMAKER IMPLANT N/A 10/10/2020   Procedure: PACEMAKER IMPLANT;  Surgeon: Marinus Maw, MD;  Location: [facility] INVASIVE CV LAB;  Service: Cardiovascular;  Laterality: N/A;   PILONIDAL CYST EXCISION     PORT-A-CATH REMOVAL Right 09/27/2018   Procedure: REMOVAL PORT-A-CATH;  Surgeon: Claud Kelp, MD;  Location:  SURGERY CENTER;  Service: General;  Laterality: Right;   PORTA CATH INSERTION  02/23/2017   PORTACATH PLACEMENT N/A 02/23/2017   Procedure: INSERTION PORT-A-CATH;  Surgeon: Glenna Fellows, MD;  Location: [facility] OR;  Service: General;  Laterality: N/A;   SHOULDER ARTHROSCOPY W/ ROTATOR CUFF REPAIR Right     Current Outpatient Medications  Medication Sig Dispense Refill   albuterol (VENTOLIN HFA) 108 (90 Base) MCG/ACT inhaler Inhale 1 puff into the lungs every 6 (six) hours as needed for wheezing or shortness of breath.     amLODipine (NORVASC) 5 MG tablet Take 1 tablet (5 mg total) by mouth daily. 90 tablet 3   aspirin 81 MG EC tablet Take 81 mg by mouth at bedtime.     chlorthalidone (HYGROTON) 25 MG tablet Take 0.5 tablets (12.5 mg total) by mouth daily. 30 tablet 0   cyclobenzaprine (FLEXERIL) 10 MG tablet Take 10 mg by mouth at bedtime.     latanoprost (XALATAN) 0.005 % ophthalmic solution Place 1 drop into both eyes at bedtime.     losartan (COZAAR) 100 MG tablet Take 100 mg by mouth daily.     metFORMIN (GLUCOPHAGE) 500 MG tablet Take 1,000 mg by mouth daily.     pregabalin (LYRICA) 150 MG capsule TAKE ONE CAPSULE BY MOUTH TWICE A DAY 60 capsule 2   rosuvastatin (CRESTOR) 5 MG tablet Take 5 mg  by mouth every other day.     vitamin B-12 (CYANOCOBALAMIN) 1000 MCG tablet Take 1,000 mcg by mouth daily.     Vitamin D, Cholecalciferol, 25 MCG (1000 UT) CAPS Take 25 mcg by mouth daily.     No current facility-administered medications for this visit.   Allergies:  Patient has no known allergies.   Social History: The patient  reports that she quit smoking about 32 years ago. Her smoking use included cigarettes. She started smoking about 69 years ago. She has a 92.1 pack-year smoking history. She has never used smokeless tobacco. She reports current alcohol use of about 2.0 standard drinks of alcohol per week. She reports that she does not use drugs.   Family  History: The patient's family history includes Cancer in her father; Diabetes in her mother; Stroke in her father.   ROS:  Please see the history of present illness. Otherwise, complete review of systems is positive for none  All other systems are reviewed and negative.   Physical Exam: VS:  BP 128/60   Pulse 63   Ht 5\' 4"  (1.626 m)   Wt 159 lb (72.1 kg)   SpO2 98%   BMI 27.29 kg/m , BMI Body mass index is 27.29 kg/m.  Wt Readings from Last 3 Encounters:  05/10/23 159 lb (72.1 kg)  05/04/23 164 lb 3.2 oz (74.5 kg)  02/02/23 158 lb 6.4 oz (71.8 kg)    General: Patient appears comfortable at rest. HEENT: Conjunctiva and lids normal, oropharynx clear with moist mucosa. Neck: Supple, no elevated JVP or carotid bruits, no thyromegaly. Lungs: Clear to auscultation, nonlabored breathing at rest. Cardiac: Regular rate and rhythm, no S3 or significant systolic murmur, no pericardial rub. Abdomen: Soft, nontender, no hepatomegaly, bowel sounds present, no guarding or rebound. Extremities: No pitting edema, distal pulses 2+. Skin: Warm and dry. Musculoskeletal: No kyphosis. Neuropsychiatric: Alert and oriented x3, affect grossly appropriate.  Recent Labwork: 10/22/2022: ALT 10; AST 17; BUN 18; Creatinine 0.91; Potassium 3.5; Sodium 131 01/07/2023: Hemoglobin 12.6; Platelets 264     Component Value Date/Time   CHOL 155 10/10/2020 0127   TRIG 41 10/10/2020 0127   HDL 49 10/10/2020 0127   CHOLHDL 3.2 10/10/2020 0127   VLDL 8 10/10/2020 0127   LDLCALC 98 10/10/2020 0127     Assessment and Plan:  Chronic diastolic HF: Compensated. No DOE except with over-exertion. D/c chlorthalidone and start Farxiga 10 mg once daily. Obtain BMP in 5 days. Discussed s/e of Farxiga.  Moderate PAD (R ABI 0.71 and L ABI 0.59): Has claudication in b/l legs, L>R. Has 2.1 cm iliac artery aneurysm in 2019. Obtain ABI and CTA pelvis and b/l LE run-off. Palpable b/l DP. Vascular surgery referral. Continue  aspirin 81 mg once daily and rosuvastatin 5 mg every other day.  2.1 cm iliac artery aneurysm: Obtain CTA pelvis and b/l LE run-off.  HTN, controlled: D/c chlorthalidone and continue amlodipine 5 mg once daily.  CHB s/p PPM: Follow with EP.       Medication Adjustments/Labs and Tests Ordered: Current medicines are reviewed at length with the patient today.  Concerns regarding medicines are outlined above.    Disposition:  Follow up  1 year  Signed Miquel Lamson Verne Spurr, MD, 05/10/2023 11:03 AM    [facility] [address] [place], [place], [place] [address]

## 2023-05-10 NOTE — Telephone Encounter (Signed)
 Checking percert on the following patient for testing scheduled at [facility].   CT ANGIO -Ao +Bifrem W/cm&Or Wo  05/27/2023

## 2023-05-13 ENCOUNTER — Other Ambulatory Visit (HOSPITAL_COMMUNITY)
Admission: RE | Admit: 2023-05-13 | Discharge: 2023-05-13 | Disposition: A | Discharge status: Home / Self Care | Source: Ambulatory Visit | Attending: Internal Medicine | Admitting: Internal Medicine

## 2023-05-13 DIAGNOSIS — I739 Peripheral vascular disease, unspecified: Secondary | ICD-10-CM | POA: Diagnosis not present

## 2023-05-13 LAB — BASIC METABOLIC PANEL WITH GFR
Anion gap: 13 (ref 5–15)
BUN: 21 mg/dL (ref 8–23)
CO2: 24 mmol/L (ref 22–32)
Calcium: 9.5 mg/dL (ref 8.9–10.3)
Chloride: 94 mmol/L — ABNORMAL LOW (ref 98–111)
Creatinine, Ser: 0.74 mg/dL (ref 0.44–1.00)
GFR, Estimated: >60 mL/min (ref >60)
Glucose, Bld: 137 mg/dL — ABNORMAL HIGH (ref 70–99)
Potassium: 3.5 mmol/L (ref 3.5–5.1)
Sodium: 131 mmol/L — ABNORMAL LOW (ref 135–145)

## 2023-05-25 ENCOUNTER — Ambulatory Visit: Attending: Internal Medicine

## 2023-05-25 DIAGNOSIS — I739 Peripheral vascular disease, unspecified: Secondary | ICD-10-CM

## 2023-05-25 LAB — VAS US ABI WITH/WO TBI
Left ABI: 0.66
Right ABI: 0.53

## 2023-05-27 ENCOUNTER — Ambulatory Visit (HOSPITAL_COMMUNITY)
Admission: RE | Admit: 2023-05-27 | Discharge: 2023-05-27 | Disposition: A | Discharge status: Home / Self Care | Source: Ambulatory Visit | Attending: Internal Medicine | Admitting: Internal Medicine

## 2023-05-27 DIAGNOSIS — I701 Atherosclerosis of renal artery: Secondary | ICD-10-CM | POA: Diagnosis not present

## 2023-05-27 DIAGNOSIS — N289 Disorder of kidney and ureter, unspecified: Secondary | ICD-10-CM | POA: Diagnosis not present

## 2023-05-27 DIAGNOSIS — I739 Peripheral vascular disease, unspecified: Secondary | ICD-10-CM | POA: Diagnosis not present

## 2023-05-27 DIAGNOSIS — I723 Aneurysm of iliac artery: Secondary | ICD-10-CM | POA: Diagnosis not present

## 2023-05-27 DIAGNOSIS — I70201 Unspecified atherosclerosis of native arteries of extremities, right leg: Secondary | ICD-10-CM | POA: Diagnosis not present

## 2023-05-27 MED ORDER — IOHEXOL 350 MG/ML SOLN
125.0000 mL | Freq: Once | INTRAVENOUS | Status: AC | PRN
  Administered 2023-05-27: 125 mL via INTRAVENOUS

## 2023-06-01 DIAGNOSIS — Z6828 Body mass index (BMI) 28.0-28.9, adult: Secondary | ICD-10-CM | POA: Diagnosis not present

## 2023-06-01 DIAGNOSIS — M79604 Pain in right leg: Secondary | ICD-10-CM | POA: Diagnosis not present

## 2023-06-02 ENCOUNTER — Encounter: Payer: Self-pay | Admitting: Vascular Surgery

## 2023-06-02 ENCOUNTER — Ambulatory Visit: Attending: Vascular Surgery | Admitting: Vascular Surgery

## 2023-06-02 VITALS — BP 154/77 | HR 81 | Temp 97.7°F | Ht 64.0 in | Wt 159.0 lb

## 2023-06-02 DIAGNOSIS — I70213 Atherosclerosis of native arteries of extremities with intermittent claudication, bilateral legs: Secondary | ICD-10-CM | POA: Diagnosis not present

## 2023-06-02 NOTE — Addendum Note (Signed)
 Addended by: Lott Rouleau A on: 06/02/2023 11:46 AM   Modules accepted: Orders

## 2023-06-02 NOTE — Progress Notes (Signed)
 Patient ID: Penny Huynh, female   DOB: [birth year], 89 y.o.   MRN: [id number]  Reason for Consult: New Patient (Initial Visit)   Referred by Penny Pointer, MD  Subjective:     HPI:  Penny Huynh is a 89 y.o. female with history of decreased ABI.  She states more recently she has had pain in her buttocks radiating down the back of both of her legs and has been started on prednisone taper but has not had any assistance yet.  She does have neuropathy cannot feel either of her feet most of the time and also has burning pain in both of her feet.  She denies any tissue loss or ulceration.  No previous vascular interventions.  Recently underwent CT angiogram for evaluation of back pain.  Past Medical History:  Diagnosis Date   Arthritis    "maybe a little in my left knee" (02/23/2017)   Asthmatic bronchitis    Cancer of left breast (HCC) 2019   Coronary atherosclerosis    Minor nonobstructive CAD at cardiac catheterization 2009   Dyslipidemia    Femoral bruit 7/08   With normal ABIs   Hypertension    Iliac artery aneurysm (HCC)    Lower extremity edema    Chronic   Pneumonia ~ 1950   in 8th grade   Type II diabetes mellitus (HCC)    Family History  Problem Relation Age of Onset   Diabetes Mother    Stroke Father    Cancer Father        unknown type    Past Surgical History:  Procedure Laterality Date   APPENDECTOMY     BREAST BIOPSY Left 02/2017   CARDIAC CATHETERIZATION     "long time ago" (02/23/2017)   MASTECTOMY COMPLETE / SIMPLE W/ SENTINEL NODE BIOPSY Left 02/23/2017   TOTAL MATECTOMY;  LEFT AXILLARY SENTINEL LYMPH NODE BIOPSY ERAS PATHWAY   MASTECTOMY WITH RADIOACTIVE SEED GUIDED EXCISION AND AXILLARY SENTINEL LYMPH NODE BIOPSY Left 02/23/2017   Procedure: LEFT TOTAL MATECTOMY;  LEFT AXILLARY SENTINEL LYMPH NODE BIOPSY ERAS PATHWAY;  Surgeon: Penny Lente, MD;  [facility]: [facility];  Service: General;  Laterality: Left;   PACEMAKER IMPLANT N/A 10/10/2020    Procedure: PACEMAKER IMPLANT;  Surgeon: Penny Fall, MD;  [facility];  Service: Cardiovascular;  Laterality: N/A;   PILONIDAL CYST EXCISION     PORT-A-CATH REMOVAL Right 09/27/2018   Procedure: REMOVAL PORT-A-CATH;  Surgeon: Penny Byes, MD;  Location: [facility];  Service: General;  Laterality: Right;   PORTA CATH INSERTION  02/23/2017   PORTACATH PLACEMENT N/A 02/23/2017   Procedure: INSERTION PORT-A-CATH;  Surgeon: Penny Lente, MD;  Location: [facility];  Service: General;  Laterality: N/A;   SHOULDER ARTHROSCOPY W/ ROTATOR CUFF REPAIR Right     Short Social History:  Social History   Tobacco Use   Smoking status: Former    Current packs/day: 0.00    Average packs/day: 2.0 packs/day for 46.0 years (92.1 ttl pk-yrs)    Types: Cigarettes    Start date: 11/09/1953    Quit date: 02/03/1991    Years since quitting: 32.3   Smokeless tobacco: Never  Substance Use Topics   Alcohol  use: Yes    Alcohol /week: 2.0 standard drinks of alcohol     Types: 2 Glasses of wine per week    Comment: social    No Known Allergies  Current Outpatient Medications  Medication Sig Dispense Refill   amLODipine  (NORVASC )  5 MG tablet Take 1 tablet (5 mg total) by mouth daily. 90 tablet 3   aspirin  81 MG EC tablet Take 81 mg by mouth at bedtime.     cyclobenzaprine (FLEXERIL) 10 MG tablet Take 10 mg by mouth at bedtime.     dapagliflozin  propanediol (FARXIGA ) 10 MG TABS tablet Take 1 tablet (10 mg total) by mouth daily before breakfast. 30 tablet 5   latanoprost  (XALATAN ) 0.005 % ophthalmic solution Place 1 drop into both eyes at bedtime.     losartan  (COZAAR ) 100 MG tablet Take 100 mg by mouth daily.     metFORMIN  (GLUCOPHAGE ) 500 MG tablet Take 1,000 mg by mouth daily.     pregabalin  (LYRICA ) 150 MG capsule TAKE ONE CAPSULE BY MOUTH TWICE A DAY 60 capsule 2   rosuvastatin  (CRESTOR ) 5 MG tablet Take 5 mg by mouth every other day.     vitamin B-12 (CYANOCOBALAMIN )  1000 MCG tablet Take 1,000 mcg by mouth daily.     Vitamin D, Cholecalciferol, 25 MCG (1000 UT) CAPS Take 25 mcg by mouth daily.     No current facility-administered medications for this visit.    Review of Systems  Constitutional:  Constitutional negative. HENT: HENT negative.  Eyes: Eyes negative.  Respiratory: Respiratory negative.  Cardiovascular: Cardiovascular negative.  GI: Gastrointestinal negative.  Musculoskeletal: Musculoskeletal negative.       Bad left great toenail Skin: Skin negative.  Neurological: Positive for numbness.  Hematologic: Hematologic/lymphatic negative.  Psychiatric: Psychiatric negative.        Objective:  Objective   Vitals:   06/02/23 0950  BP: (!) 154/77  Pulse: 81  Temp: 97.7 F (36.5 C)  SpO2: 97%  Weight: 159 lb (72.1 kg)  Height: 5\' 4"  (1.626 m)   Body mass index is 27.29 kg/m.  Physical Exam HENT:     Head: Normocephalic.     Mouth/Throat:     Mouth: Mucous membranes are moist.  Eyes:     Pupils: Pupils are equal, round, and reactive to light.  Cardiovascular:     Pulses:          Femoral pulses are 0 on the right side and 0 on the left side. Pulmonary:     Effort: Pulmonary effort is normal.  Abdominal:     General: Abdomen is flat.  Musculoskeletal:        General: Normal range of motion.     Right lower leg: No edema.     Left lower leg: No edema.  Skin:    General: Skin is warm.  Neurological:     Mental Status: She is alert.  Psychiatric:        Mood and Affect: Mood normal.        Thought Content: Thought content normal.        Judgment: Judgment normal.     Data: ABI Findings:  +---------+------------------+-----+----------+--------+  Right   Rt Pressure (mmHg)IndexWaveform  Comment   +---------+------------------+-----+----------+--------+  Brachial 151                                        +---------+------------------+-----+----------+--------+  PTA     44                0.29  monophasic          +---------+------------------+-----+----------+--------+  DP      80  0.53 monophasic          +---------+------------------+-----+----------+--------+  Great Toe54                0.36                     +---------+------------------+-----+----------+--------+   +---------+------------------+-----+----------+-------+  Left    Lt Pressure (mmHg)IndexWaveform  Comment  +---------+------------------+-----+----------+-------+  Brachial 148                                       +---------+------------------+-----+----------+-------+  PTA     61                0.40 monophasic         +---------+------------------+-----+----------+-------+  DP      100               0.66 monophasic         +---------+------------------+-----+----------+-------+  Great Toe77                0.51                    +---------+------------------+-----+----------+-------+   +-------+-----------+-----------+------------+------------+  ABI/TBIToday's ABIToday's TBIPrevious ABIPrevious TBI  +-------+-----------+-----------+------------+------------+  Right 0.53       0.36       0.71        0.44          +-------+-----------+-----------+------------+------------+  Left  0.66       0.51       0.59        0.59          +-------+-----------+-----------+------------+------------+         Right ABIs and TBIs appear decreased compared to prior study on 03/17/2019.  Left TBIs appear decreased compared to prior study on 03/17/2019.    Summary:  Right: Resting right ankle-brachial index indicates moderate right lower  extremity arterial disease. The right toe-brachial index is abnormal.   Left: Resting left ankle-brachial index indicates moderate left lower  extremity arterial disease. The left toe-brachial index is abnormal.   CTA IMPRESSION: 1. Visceral disease: Moderate stenoses are present in the celiac artery, SMA,  and right main renal artery. 2. Right lower extremity: Moderate stenosis in the right common iliac artery. Aneurysmal dilatation of the right distal common iliac artery measuring 2.0 cm in diameter. Moderate disease in the right common femoral artery. Multifocal short-segment moderate disease in the right SFA with severe short-segment focal stenosis in the distal segment. Runoff evaluation is limited by phase of contrast agent acquisition. Long segment occlusions of the anterior tibial and posterior tibial territory are favored. The peroneal artery is likely the primary runoff vessel. 3. Left lower extremity: Mild inflow disease. Moderate left SFA disease which is short segment and multifocal. Patent popliteal artery with mild disease. Evaluation is limited by phase of contrast agent acquisition. Long segment PT occlusion. Moderate disease in the anterior tibial and peroneal artery territories. 4. Degenerative changes are also noted within the lumbar spine.     Assessment/Plan:    89 year old female with moderately decreased ABIs bilaterally but symptoms appear mostly related to neuropathy and currently is being treated for sciatica.  Plan will be for follow-up in 6 months with repeat ABIs unless she has issues prior to that.    Adine Hoof MD [facility]

## 2023-06-02 NOTE — Progress Notes (Signed)
 Remote pacemaker transmission.

## 2023-06-10 ENCOUNTER — Other Ambulatory Visit: Payer: Self-pay | Admitting: *Deleted

## 2023-06-10 DIAGNOSIS — I70213 Atherosclerosis of native arteries of extremities with intermittent claudication, bilateral legs: Secondary | ICD-10-CM

## 2023-06-18 DIAGNOSIS — Z6828 Body mass index (BMI) 28.0-28.9, adult: Secondary | ICD-10-CM | POA: Diagnosis not present

## 2023-06-18 DIAGNOSIS — M791 Myalgia, unspecified site: Secondary | ICD-10-CM | POA: Diagnosis not present

## 2023-06-18 DIAGNOSIS — B3731 Acute candidiasis of vulva and vagina: Secondary | ICD-10-CM | POA: Diagnosis not present

## 2023-06-18 DIAGNOSIS — H81319 Aural vertigo, unspecified ear: Secondary | ICD-10-CM | POA: Diagnosis not present

## 2023-07-08 DIAGNOSIS — M6281 Muscle weakness (generalized): Secondary | ICD-10-CM | POA: Diagnosis not present

## 2023-07-08 DIAGNOSIS — M545 Low back pain, unspecified: Secondary | ICD-10-CM | POA: Diagnosis not present

## 2023-07-13 DIAGNOSIS — M6281 Muscle weakness (generalized): Secondary | ICD-10-CM | POA: Diagnosis not present

## 2023-07-14 DIAGNOSIS — E7849 Other hyperlipidemia: Secondary | ICD-10-CM | POA: Diagnosis not present

## 2023-07-14 DIAGNOSIS — I10 Essential (primary) hypertension: Secondary | ICD-10-CM | POA: Diagnosis not present

## 2023-07-14 DIAGNOSIS — E876 Hypokalemia: Secondary | ICD-10-CM | POA: Diagnosis not present

## 2023-07-14 DIAGNOSIS — E1165 Type 2 diabetes mellitus with hyperglycemia: Secondary | ICD-10-CM | POA: Diagnosis not present

## 2023-07-14 DIAGNOSIS — E7801 Familial hypercholesterolemia: Secondary | ICD-10-CM | POA: Diagnosis not present

## 2023-07-15 DIAGNOSIS — M6281 Muscle weakness (generalized): Secondary | ICD-10-CM | POA: Diagnosis not present

## 2023-07-19 DIAGNOSIS — M545 Low back pain, unspecified: Secondary | ICD-10-CM | POA: Diagnosis not present

## 2023-07-19 DIAGNOSIS — M6281 Muscle weakness (generalized): Secondary | ICD-10-CM | POA: Diagnosis not present

## 2023-07-21 ENCOUNTER — Encounter: Payer: Self-pay | Admitting: Hematology

## 2023-07-21 DIAGNOSIS — E1140 Type 2 diabetes mellitus with diabetic neuropathy, unspecified: Secondary | ICD-10-CM | POA: Diagnosis not present

## 2023-07-21 DIAGNOSIS — Z6828 Body mass index (BMI) 28.0-28.9, adult: Secondary | ICD-10-CM | POA: Diagnosis not present

## 2023-07-21 DIAGNOSIS — E782 Mixed hyperlipidemia: Secondary | ICD-10-CM | POA: Diagnosis not present

## 2023-07-21 DIAGNOSIS — E7849 Other hyperlipidemia: Secondary | ICD-10-CM | POA: Diagnosis not present

## 2023-07-21 DIAGNOSIS — I10 Essential (primary) hypertension: Secondary | ICD-10-CM | POA: Diagnosis not present

## 2023-07-21 DIAGNOSIS — Z23 Encounter for immunization: Secondary | ICD-10-CM | POA: Diagnosis not present

## 2023-07-21 DIAGNOSIS — E1165 Type 2 diabetes mellitus with hyperglycemia: Secondary | ICD-10-CM | POA: Diagnosis not present

## 2023-07-21 DIAGNOSIS — M6281 Muscle weakness (generalized): Secondary | ICD-10-CM | POA: Diagnosis not present

## 2023-07-21 DIAGNOSIS — M545 Low back pain, unspecified: Secondary | ICD-10-CM | POA: Diagnosis not present

## 2023-07-22 ENCOUNTER — Ambulatory Visit (INDEPENDENT_AMBULATORY_CARE_PROVIDER_SITE_OTHER): Payer: Medicare Other

## 2023-07-22 DIAGNOSIS — I442 Atrioventricular block, complete: Secondary | ICD-10-CM

## 2023-07-22 LAB — CUP PACEART REMOTE DEVICE CHECK
Battery Remaining Longevity: 77 mo
Battery Remaining Percentage: 73 %
Battery Voltage: 3.01 V
Brady Statistic AP VP Percent: 24 %
Brady Statistic AP VS Percent: 1 %
Brady Statistic AS VP Percent: 76 %
Brady Statistic AS VS Percent: 1 %
Brady Statistic RA Percent Paced: 24 %
Brady Statistic RV Percent Paced: 99 %
Date Time Interrogation Session: 20250619020015
Implantable Lead Connection Status: 753985
Implantable Lead Connection Status: 753985
Implantable Lead Implant Date: 20220908
Implantable Lead Implant Date: 20220908
Implantable Lead Location: 753859
Implantable Lead Location: 753860
Implantable Pulse Generator Implant Date: 20220908
Lead Channel Impedance Value: 480 Ohm
Lead Channel Impedance Value: 540 Ohm
Lead Channel Pacing Threshold Amplitude: 0.5 V
Lead Channel Pacing Threshold Amplitude: 0.75 V
Lead Channel Pacing Threshold Pulse Width: 0.5 ms
Lead Channel Pacing Threshold Pulse Width: 0.5 ms
Lead Channel Sensing Intrinsic Amplitude: 0.7 mV
Lead Channel Sensing Intrinsic Amplitude: 8.3 mV
Lead Channel Setting Pacing Amplitude: 2 V
Lead Channel Setting Pacing Amplitude: 2.5 V
Lead Channel Setting Pacing Pulse Width: 0.5 ms
Lead Channel Setting Sensing Sensitivity: 2 mV
Pulse Gen Model: 2272
Pulse Gen Serial Number: 6519562

## 2023-07-25 ENCOUNTER — Ambulatory Visit: Payer: Self-pay | Admitting: Internal Medicine

## 2023-07-28 DIAGNOSIS — M6281 Muscle weakness (generalized): Secondary | ICD-10-CM | POA: Diagnosis not present

## 2023-07-28 DIAGNOSIS — M545 Low back pain, unspecified: Secondary | ICD-10-CM | POA: Diagnosis not present

## 2023-07-29 DIAGNOSIS — M5441 Lumbago with sciatica, right side: Secondary | ICD-10-CM | POA: Diagnosis not present

## 2023-07-29 DIAGNOSIS — Z6827 Body mass index (BMI) 27.0-27.9, adult: Secondary | ICD-10-CM | POA: Diagnosis not present

## 2023-07-30 DIAGNOSIS — M545 Low back pain, unspecified: Secondary | ICD-10-CM | POA: Diagnosis not present

## 2023-07-30 DIAGNOSIS — M6281 Muscle weakness (generalized): Secondary | ICD-10-CM | POA: Diagnosis not present

## 2023-08-09 ENCOUNTER — Other Ambulatory Visit (HOSPITAL_COMMUNITY): Payer: Self-pay | Admitting: Family Medicine

## 2023-08-09 DIAGNOSIS — M5441 Lumbago with sciatica, right side: Secondary | ICD-10-CM

## 2023-08-16 ENCOUNTER — Ambulatory Visit: Admitting: Neurology

## 2023-08-16 ENCOUNTER — Encounter: Payer: Self-pay | Admitting: Neurology

## 2023-08-16 VITALS — BP 138/60 | HR 72 | Ht 63.0 in | Wt 163.6 lb

## 2023-08-16 DIAGNOSIS — I739 Peripheral vascular disease, unspecified: Secondary | ICD-10-CM | POA: Diagnosis not present

## 2023-08-16 DIAGNOSIS — I442 Atrioventricular block, complete: Secondary | ICD-10-CM | POA: Diagnosis not present

## 2023-08-16 DIAGNOSIS — I723 Aneurysm of iliac artery: Secondary | ICD-10-CM

## 2023-08-16 DIAGNOSIS — T451X5A Adverse effect of antineoplastic and immunosuppressive drugs, initial encounter: Secondary | ICD-10-CM

## 2023-08-16 DIAGNOSIS — H8110 Benign paroxysmal vertigo, unspecified ear: Secondary | ICD-10-CM | POA: Diagnosis not present

## 2023-08-16 DIAGNOSIS — G620 Drug-induced polyneuropathy: Secondary | ICD-10-CM | POA: Diagnosis not present

## 2023-08-16 NOTE — Progress Notes (Signed)
 [facility]    Provider:  Dedra Gores, MD  Primary Care Physician:  Atilano Deward ORN, MD [address] [place] [address]     Referring Provider: Atilano Deward ORN, Md [address] [place],  [place] [address]          Chief Complaint according to patient   Patient presents with:     New Patient (Initial Visit)           HISTORY OF PRESENT ILLNESS:  Penny Huynh is a 89 y.o. female patient who is seen upon referral on 08/16/2023 from Dr Atilano  for a Consult regarding vertigo. She walks with a walker, has a pacemaker ,  has slight HA in the evening, a feeling of pressure , also sciatica- legs  just buckle.  She had a fall  with bruising on the right hip and was seen PT.  Not in vestibular rehab.  Can't have MRI.  Vertigo while watching TV- not related to movements. Not orthostatic. BP taken on right arm due to mastectomy.  PAD in popliteal, femoral artery.  Has cramps at night , not while walking.    Chief concern according to patient :    Pt referred by PCP for dizziness and headaches. Pt states this has been going on for several months. States sometimes head just feels so full and sometimes the room spins. Pt states last week turned her head to the left and lost vision for a couple of seconds. This concerns her because she drives and does all the driving for her and her husband. The headaches have not been too bad and usually come in late afternoon.      I have the pleasure of seeing Penny Huynh 08/16/23 a right-handed female with a possible Vertigo  related dizziness, but  has sciatica, back pain, fall after legs buckle.       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  Lives in a household with spouse and is the only driver.   Caffeine intake in form of Coffee( quit 3 years ago ) Soda( /) Tea ( hot tea , caffeine  1 a day) or energy drinks Exercise in form of  [employer], pool  .         Review of Systems: Out of a complete 14 system review, the patient complains of only the following symptoms, and all other reviewed systems are negative.:  See above .   Low back discomfort, left hip pain is not currently  a problem  sciatica in both legs. -  Social History   Socioeconomic History   Marital status: Married    Spouse name: Not on file   Number of children: 2   Years of education: Not on file   Highest education level: Not on file  Occupational History   Occupation: Clinical cytogeneticist for [employer]    Comment: only works during tax season but not currently    Occupation: in Research scientist (physical sciences)    Occupation: retired Theatre manager of education     Comment: 1997  Tobacco Use   Smoking status: Former    Current packs/day: 0.00    Average packs/day: 2.0 packs/day for 46.0 years (92.1 ttl pk-yrs)    Types: Cigarettes    Start date: 11/09/1953    Quit date: 02/03/1991  Years since quitting: 32.5   Smokeless tobacco: Never  Vaping Use   Vaping status: Never Used  Substance and Sexual Activity   Alcohol  use: Yes    Alcohol /week: 2.0 standard drinks of alcohol     Types: 2 Glasses of wine per week    Comment: occasionally   Drug use: No   Sexual activity: Not on file  Other Topics Concern   Not on file  Social History Narrative   Lives in [place] with her husband.   Has 2 grown children.   10-25-17 Unable to ask abuse questions husband with her today.   Social Drivers of Corporate investment banker Strain: Not on file  Food Insecurity: Not on file  Transportation Needs: Not on file  Physical Activity: Not on file  Stress: Not on file  Social Connections: Not on file    Family History  Problem Relation Age of Onset   Diabetes Mother    Stroke Father    Cancer Father        unknown type     Past Medical History:  Diagnosis Date   Arthritis    maybe a little in my left knee (02/23/2017)   Asthmatic bronchitis    Cancer of left breast (HCC) 2019    Coronary atherosclerosis    Minor nonobstructive CAD at cardiac catheterization 2009   Dyslipidemia    Femoral bruit 7/08   With normal ABIs   Hypertension    Iliac artery aneurysm (HCC)    Lower extremity edema    Chronic   Pneumonia ~ 1950   in 8th grade   Type II diabetes mellitus (HCC)     Past Surgical History:  Procedure Laterality Date   APPENDECTOMY     BREAST BIOPSY Left 02/2017   CARDIAC CATHETERIZATION     long time ago (02/23/2017)   MASTECTOMY COMPLETE / SIMPLE W/ SENTINEL NODE BIOPSY Left 02/23/2017   TOTAL MATECTOMY;  LEFT AXILLARY SENTINEL LYMPH NODE BIOPSY ERAS PATHWAY   MASTECTOMY WITH RADIOACTIVE SEED GUIDED EXCISION AND AXILLARY SENTINEL LYMPH NODE BIOPSY Left 02/23/2017   Procedure: LEFT TOTAL MATECTOMY;  LEFT AXILLARY SENTINEL LYMPH NODE BIOPSY ERAS PATHWAY;  Surgeon: Mikell Katz, MD;  [facility]: [facility];  Service: General;  Laterality: Left;   PACEMAKER IMPLANT N/A 10/10/2020   Procedure: PACEMAKER IMPLANT;  Surgeon: Waddell Danelle ORN, MD;  [facility];  Service: Cardiovascular;  Laterality: N/A;   PILONIDAL CYST EXCISION     PORT-A-CATH REMOVAL Right 09/27/2018   Procedure: REMOVAL PORT-A-CATH;  Surgeon: Gail Favorite, MD;  Location: [facility];  Service: General;  Laterality: Right;   PORTA CATH INSERTION  02/23/2017   PORTACATH PLACEMENT N/A 02/23/2017   Procedure: INSERTION PORT-A-CATH;  Surgeon: Mikell Katz, MD;  [facility]: [facility];  Service: General;  Laterality: N/A;   SHOULDER ARTHROSCOPY W/ ROTATOR CUFF REPAIR Right      Current Outpatient Medications on File Prior to Visit  Medication Sig Dispense Refill   alendronate (FOSAMAX) 70 MG tablet Take 70 mg by mouth once a week. Takes every Monday     amLODipine  (NORVASC ) 5 MG tablet Take 1 tablet (5 mg total) by mouth daily. 90 tablet 3   aspirin  81 MG EC tablet Take 81 mg by mouth at bedtime.     Biotin 5 MG CAPS Take 5 mg by mouth daily.     chlorthalidone   (HYGROTON ) 25 MG tablet Take 25 mg by mouth daily.     Coenzyme  Q10 200 MG capsule Take 200 mg by mouth daily.     latanoprost  (XALATAN ) 0.005 % ophthalmic solution Place 1 drop into both eyes at bedtime.     losartan  (COZAAR ) 100 MG tablet Take 100 mg by mouth daily.     metFORMIN  (GLUCOPHAGE ) 500 MG tablet Take 1,000 mg by mouth daily.     pregabalin  (LYRICA ) 150 MG capsule TAKE ONE CAPSULE BY MOUTH TWICE A DAY 60 capsule 2   rosuvastatin  (CRESTOR ) 5 MG tablet Take 5 mg by mouth every other day.     vitamin B-12 (CYANOCOBALAMIN ) 1000 MCG tablet Take 1,000 mcg by mouth daily.     Vitamin D, Cholecalciferol, 25 MCG (1000 UT) CAPS Take 25 mcg by mouth daily.     cyclobenzaprine (FLEXERIL) 10 MG tablet Take 10 mg by mouth at bedtime. (Patient not taking: Reported on 08/16/2023)     dapagliflozin  propanediol (FARXIGA ) 10 MG TABS tablet Take 1 tablet (10 mg total) by mouth daily before breakfast. (Patient not taking: Reported on 08/16/2023) 30 tablet 5   No current facility-administered medications on file prior to visit.    No Known Allergies   DIAGNOSTIC DATA (LABS, IMAGING, TESTING) - I reviewed patient records, labs, notes, testing and imaging myself where available.  Lab Results  Component Value Date   WBC 4.1 01/07/2023   HGB 12.6 01/07/2023   HCT 38.2 01/07/2023   MCV 92 01/07/2023   PLT 264 01/07/2023      Component Value Date/Time   NA 131 (L) 05/13/2023 1433   K 3.5 05/13/2023 1433   CL 94 (L) 05/13/2023 1433   CO2 24 05/13/2023 1433   GLUCOSE 137 (H) 05/13/2023 1433   BUN 21 05/13/2023 1433   CREATININE 0.74 05/13/2023 1433   CREATININE 0.91 10/22/2022 1131   CALCIUM  9.5 05/13/2023 1433   PROT 7.1 10/22/2022 1131   ALBUMIN 4.3 10/22/2022 1131   AST 17 10/22/2022 1131   ALT 10 10/22/2022 1131   ALKPHOS 44 10/22/2022 1131   BILITOT 0.9 10/22/2022 1131   GFRNONAA >60 05/13/2023 1433   GFRNONAA >60 10/22/2022 1131   GFRAA >60 10/06/2019 1014   Lab Results  Component  Value Date   CHOL 155 10/10/2020   HDL 49 10/10/2020   LDLCALC 98 10/10/2020   TRIG 41 10/10/2020   CHOLHDL 3.2 10/10/2020   Lab Results  Component Value Date   HGBA1C 7.4 (H) 10/09/2020   Lab Results  Component Value Date   VITAMINB12 1,108 (H) 10/22/2022   Lab Results  Component Value Date   TSH 2.635 10/10/2020    PHYSICAL EXAM:  Today's Vitals   08/16/23 1039  BP: 138/60  Pulse: 72  Weight: 163 lb 9.6 oz (74.2 kg)  Height: 5' 3 (1.6 m)   Body mass index is 28.98 kg/m.   Wt Readings from Last 3 Encounters:  08/16/23 163 lb 9.6 oz (74.2 kg)  06/02/23 159 lb (72.1 kg)  05/10/23 159 lb (72.1 kg)     Ht Readings from Last 3 Encounters:  08/16/23 5' 3 (1.6 m)  06/02/23 5' 4 (1.626 m)  05/10/23 5' 4 (1.626 m)      General: The patient is awake, alert and appears not in acute distress. The patient is well groomed. Head: Normocephalic, atraumatic. Neck is supple. Cardiovascular:  Regular rate and cardiac rhythm by pulse,  paced rhythm- without distended neck veins. Respiratory: Lungs are clear to auscultation.  Skin:  With evidence of ankle edema, not rash. PAD  in polpiteal, femoral artery.  Trunk: The patient's posture is erect.   NEUROLOGIC EXAM: The patient is awake and alert, oriented to place and time.   Memory subjective described as intact.  Attention span & concentration ability appears normal.  Speech is fluent,  without  dysarthria, dysphonia or aphasia.  Mood and affect are appropriate.   Cranial nerves: no loss of smell or taste reported  Pupils are equal and briskly reactive to light. Funduscopic exam deferred. .  Extraocular movements in vertical and horizontal planes were intact and without nystagmus. No Diplopia. Visual fields by finger perimetry are intact. Hearing was intact to soft voice and finger rubbing.    Facial sensation intact to fine touch.  Facial motor strength is symmetric and tongue and uvula move midline.  Neck ROM :  rotation, tilt and flexion extension were normal for age and shoulder shrug was symmetrical.    Motor exam:  Symmetric bulk, tone and ROM.   Normal tone without cog wheeling, symmetric grip strength .   Sensory:  Fine touch, pinprick and vibration were tested  and  normal.  Proprioception tested in the upper extremities was normal.   Coordination: Rapid alternating movements in the fingers/hands were of normal speed.  The Finger-to-nose maneuver was intact without evidence of ataxia, dysmetria or tremor.   Gait and station: Patient could not rise unassisted from a seated position, walked with a walker  assistive device.  Very slowly walking and very insecure when turning.  Stance is of normal width/ base but slightly stooped- and the patient turned with 6 (!)  steps.  Toe and heel walk were deferred.  Deep tendon reflexes: in the  upper and lower extremities are absent.  Babinski response was  equivocal     ASSESSMENT AND PLAN 89 y.o. year old female  here with:    1) paroxysmal vertigo , onset at rest ( watching TV ) and now has passed.  Last week was the last  episode she reported , the one before was 2-3 weeks earlier.   2) she has mild headaches, a feeling of pressure inside the brain , onset in late afternoon.  They pass when she sleeps.  No nausea , photophobia, phonophobia.   3) Gait is affected by what she called sciatica, but likely also by PAD. She has no DTR anywhere that I could elicit.  Chemotherapy/ radiation  induced peripheral  Neuropathy is also present, can overlap with DM NP.     Plan :  can't get MRI brain in [place] , will order CT  scan of the brain.  I want to make sure there is no metastasis, no stroke, No NPH,  no sinus problem.   Vertigo: Physical therapy with vestibular rehab is not indicated if the spells are that few and far apart.  She denies any valsalva induced headaches.   Legs buckling; I recommend to see an orthopedist about the back problems.   The neuropathy is profound - and I can offer an NCV and EMG study.   I ordered CT angio head and neck- CT head .    I plan to follow up either personally or through our NP within 4-6 months.   I would like to thank  Atilano Deward ORN, Md [address] [place],  [place] [address] for allowing me to meet with and to take care of this pleasant patient.     After spending a total time of  45  minutes face to face  and additional time for physical and neurologic examination, review of laboratory studies,  personal review of imaging studies, reports and results of other testing and review of referral information / records as far as provided in visit,   Electronically signed by: Dedra Gores, MD 08/16/2023 11:04 AM  [facility] Board certified by The [employer] of Sleep Medicine and Diplomate of the [employer] of Sleep Medicine. Board certified In Neurology through the ABPN, Fellow of the [employer] of Neurology.

## 2023-08-16 NOTE — Patient Instructions (Signed)
 89 y.o. year old female  here with:     1) paroxysmal vertigo , onset at rest ( watching TV ) and now has passed.  Last week was the last  episode she reported , the one before was 2-3 weeks earlier.    2) she has mild headaches, a feeling of pressure inside the brain , onset in late afternoon.  They pass when she sleeps.  No nausea , photophobia, phonophobia.    3) Gait is affected by what she called sciatica, but likely also by PAD. She has no DTR anywhere that I could elicit.  Chemotherapy/ radiation  induced peripheral  Neuropathy is also present, can overlap with DM NP.       Plan :  can't get MRI brain in [place] , will order CT  scan of the brain.  I want to make sure there is no metastasis, no stroke, No NPH,  no sinus problem.    Vertigo: Physical therapy with vestibular rehab is not indicated if the spells are that few and far apart.  She denies any valsalva induced headaches.    Legs buckling; I recommend to see an orthopedist about the back problems.  The neuropathy is profound - and I can offer an NCV and EMG study.    I ordered CT angio head and neck- CT head .      I plan to follow up either personally or through our NP within 4-6 months.

## 2023-08-16 NOTE — Addendum Note (Signed)
 Addended by: Jadamarie Butson K on: 08/16/2023 12:06 PM   Modules accepted: Orders

## 2023-08-17 ENCOUNTER — Telehealth: Payer: Self-pay | Admitting: Neurology

## 2023-08-17 LAB — CBC WITH DIFFERENTIAL/PLATELET
Basophils Absolute: 0.1 x10E3/uL (ref 0.0–0.2)
Basos: 1 %
EOS (ABSOLUTE): 0.1 x10E3/uL (ref 0.0–0.4)
Eos: 1 %
Hematocrit: 37.6 % (ref 34.0–46.6)
Hemoglobin: 11.8 g/dL (ref 11.1–15.9)
Immature Grans (Abs): 0 x10E3/uL (ref 0.0–0.1)
Immature Granulocytes: 0 %
Lymphocytes Absolute: 2.4 x10E3/uL (ref 0.7–3.1)
Lymphs: 35 %
MCH: 28.9 pg (ref 26.6–33.0)
MCHC: 31.4 g/dL — ABNORMAL LOW (ref 31.5–35.7)
MCV: 92 fL (ref 79–97)
Monocytes Absolute: 0.5 x10E3/uL (ref 0.1–0.9)
Monocytes: 7 %
Neutrophils Absolute: 3.9 x10E3/uL (ref 1.4–7.0)
Neutrophils: 56 %
Platelets: 242 x10E3/uL (ref 150–450)
RBC: 4.08 x10E6/uL (ref 3.77–5.28)
RDW: 12.8 % (ref 11.7–15.4)
WBC: 6.9 x10E3/uL (ref 3.4–10.8)

## 2023-08-17 LAB — COMPREHENSIVE METABOLIC PANEL WITH GFR
ALT: 13 IU/L (ref 0–32)
AST: 17 IU/L (ref 0–40)
Albumin: 4.2 g/dL (ref 3.7–4.7)
Alkaline Phosphatase: 54 IU/L (ref 44–121)
BUN/Creatinine Ratio: 20 (ref 12–28)
BUN: 16 mg/dL (ref 8–27)
Bilirubin Total: 0.6 mg/dL (ref 0.0–1.2)
CO2: 25 mmol/L (ref 20–29)
Calcium: 9.9 mg/dL (ref 8.7–10.3)
Chloride: 91 mmol/L — ABNORMAL LOW (ref 96–106)
Creatinine, Ser: 0.8 mg/dL (ref 0.57–1.00)
Globulin, Total: 2.2 g/dL (ref 1.5–4.5)
Glucose: 109 mg/dL — ABNORMAL HIGH (ref 70–99)
Potassium: 3.7 mmol/L (ref 3.5–5.2)
Sodium: 133 mmol/L — ABNORMAL LOW (ref 134–144)
Total Protein: 6.4 g/dL (ref 6.0–8.5)
eGFR: 71 mL/min/1.73 (ref >59)

## 2023-08-17 NOTE — Telephone Encounter (Signed)
 CT head: Penny Huynh: [id number] exp. 08/17/23-10/16/23  CTA head/neck: Penny Huynh: [id number] exp. 08/17/23-10/16/23 sent to [facility] [phone]

## 2023-08-26 ENCOUNTER — Other Ambulatory Visit (HOSPITAL_COMMUNITY): Payer: Self-pay | Admitting: Family Medicine

## 2023-08-26 ENCOUNTER — Other Ambulatory Visit (HOSPITAL_COMMUNITY)

## 2023-08-26 DIAGNOSIS — M5441 Lumbago with sciatica, right side: Secondary | ICD-10-CM

## 2023-08-26 NOTE — Progress Notes (Signed)
  Device system confirmed   Device last cleared by EP Provider: Elvie RIddle, NP 08/26/2023  Clearance is good through for 1 year as long as parameters remain stable at time of check. If pt undergoes a cardiac device procedure during that time, they should be re-cleared.   Tachy-therapies to be programmed off if applicable with device back to pre-MRI settings after completion of exam.  [employer] will be present for programming for the MRI.   Suzan Recardo Arna Debby  08/26/2023 1:47 PM

## 2023-08-31 ENCOUNTER — Encounter (HOSPITAL_COMMUNITY): Payer: Self-pay

## 2023-08-31 ENCOUNTER — Ambulatory Visit (HOSPITAL_COMMUNITY): Admission: RE | Admit: 2023-08-31 | Source: Ambulatory Visit

## 2023-08-31 ENCOUNTER — Ambulatory Visit (HOSPITAL_COMMUNITY)
Admission: RE | Admit: 2023-08-31 | Discharge: 2023-08-31 | Disposition: A | Discharge status: Home / Self Care | Source: Ambulatory Visit | Attending: Neurology | Admitting: Neurology

## 2023-08-31 DIAGNOSIS — R519 Headache, unspecified: Secondary | ICD-10-CM | POA: Diagnosis not present

## 2023-08-31 DIAGNOSIS — G620 Drug-induced polyneuropathy: Secondary | ICD-10-CM | POA: Diagnosis not present

## 2023-08-31 DIAGNOSIS — I723 Aneurysm of iliac artery: Secondary | ICD-10-CM | POA: Diagnosis not present

## 2023-08-31 DIAGNOSIS — T451X5A Adverse effect of antineoplastic and immunosuppressive drugs, initial encounter: Secondary | ICD-10-CM | POA: Insufficient documentation

## 2023-08-31 DIAGNOSIS — I442 Atrioventricular block, complete: Secondary | ICD-10-CM | POA: Diagnosis not present

## 2023-08-31 DIAGNOSIS — I739 Peripheral vascular disease, unspecified: Secondary | ICD-10-CM | POA: Insufficient documentation

## 2023-08-31 DIAGNOSIS — H8110 Benign paroxysmal vertigo, unspecified ear: Secondary | ICD-10-CM | POA: Insufficient documentation

## 2023-08-31 DIAGNOSIS — I6523 Occlusion and stenosis of bilateral carotid arteries: Secondary | ICD-10-CM | POA: Diagnosis not present

## 2023-08-31 MED ORDER — IOHEXOL 350 MG/ML SOLN
75.0000 mL | Freq: Once | INTRAVENOUS | Status: AC | PRN
  Administered 2023-08-31: 75 mL via INTRAVENOUS

## 2023-08-31 MED ORDER — SODIUM CHLORIDE (PF) 0.9 % IJ SOLN
INTRAMUSCULAR | Status: AC
  Filled 2023-08-31: qty 50

## 2023-09-01 ENCOUNTER — Ambulatory Visit (HOSPITAL_COMMUNITY)
Admission: RE | Admit: 2023-09-01 | Discharge: 2023-09-01 | Disposition: A | Discharge status: Home / Self Care | Source: Ambulatory Visit | Attending: Family Medicine | Admitting: Family Medicine

## 2023-09-01 DIAGNOSIS — M5442 Lumbago with sciatica, left side: Secondary | ICD-10-CM | POA: Diagnosis not present

## 2023-09-01 DIAGNOSIS — M5441 Lumbago with sciatica, right side: Secondary | ICD-10-CM | POA: Insufficient documentation

## 2023-09-01 DIAGNOSIS — M4807 Spinal stenosis, lumbosacral region: Secondary | ICD-10-CM | POA: Diagnosis not present

## 2023-09-01 DIAGNOSIS — M5126 Other intervertebral disc displacement, lumbar region: Secondary | ICD-10-CM | POA: Diagnosis not present

## 2023-09-01 DIAGNOSIS — M48061 Spinal stenosis, lumbar region without neurogenic claudication: Secondary | ICD-10-CM | POA: Diagnosis not present

## 2023-09-01 DIAGNOSIS — M47816 Spondylosis without myelopathy or radiculopathy, lumbar region: Secondary | ICD-10-CM | POA: Diagnosis not present

## 2023-09-07 ENCOUNTER — Ambulatory Visit: Payer: Self-pay | Admitting: Neurology

## 2023-09-10 DIAGNOSIS — M51362 Other intervertebral disc degeneration, lumbar region with discogenic back pain and lower extremity pain: Secondary | ICD-10-CM | POA: Diagnosis not present

## 2023-09-10 DIAGNOSIS — Z6830 Body mass index (BMI) 30.0-30.9, adult: Secondary | ICD-10-CM | POA: Diagnosis not present

## 2023-09-15 DIAGNOSIS — R600 Localized edema: Secondary | ICD-10-CM | POA: Diagnosis not present

## 2023-09-15 DIAGNOSIS — I701 Atherosclerosis of renal artery: Secondary | ICD-10-CM | POA: Diagnosis not present

## 2023-09-15 DIAGNOSIS — E1151 Type 2 diabetes mellitus with diabetic peripheral angiopathy without gangrene: Secondary | ICD-10-CM | POA: Diagnosis not present

## 2023-09-15 DIAGNOSIS — E785 Hyperlipidemia, unspecified: Secondary | ICD-10-CM | POA: Diagnosis not present

## 2023-09-15 DIAGNOSIS — C50919 Malignant neoplasm of unspecified site of unspecified female breast: Secondary | ICD-10-CM | POA: Diagnosis not present

## 2023-09-15 DIAGNOSIS — E1142 Type 2 diabetes mellitus with diabetic polyneuropathy: Secondary | ICD-10-CM | POA: Diagnosis not present

## 2023-09-15 DIAGNOSIS — M5440 Lumbago with sciatica, unspecified side: Secondary | ICD-10-CM | POA: Diagnosis not present

## 2023-09-15 DIAGNOSIS — E1136 Type 2 diabetes mellitus with diabetic cataract: Secondary | ICD-10-CM | POA: Diagnosis not present

## 2023-09-15 DIAGNOSIS — M810 Age-related osteoporosis without current pathological fracture: Secondary | ICD-10-CM | POA: Diagnosis not present

## 2023-09-22 ENCOUNTER — Other Ambulatory Visit: Payer: Self-pay | Admitting: Nurse Practitioner

## 2023-09-22 DIAGNOSIS — Z1231 Encounter for screening mammogram for malignant neoplasm of breast: Secondary | ICD-10-CM

## 2023-09-27 NOTE — Progress Notes (Signed)
 Remote pacemaker transmission.

## 2023-10-06 DIAGNOSIS — Z961 Presence of intraocular lens: Secondary | ICD-10-CM | POA: Diagnosis not present

## 2023-10-06 DIAGNOSIS — H401131 Primary open-angle glaucoma, bilateral, mild stage: Secondary | ICD-10-CM | POA: Diagnosis not present

## 2023-10-06 DIAGNOSIS — E119 Type 2 diabetes mellitus without complications: Secondary | ICD-10-CM | POA: Diagnosis not present

## 2023-10-06 DIAGNOSIS — H04123 Dry eye syndrome of bilateral lacrimal glands: Secondary | ICD-10-CM | POA: Diagnosis not present

## 2023-10-06 DIAGNOSIS — H43811 Vitreous degeneration, right eye: Secondary | ICD-10-CM | POA: Diagnosis not present

## 2023-10-13 ENCOUNTER — Ambulatory Visit (INDEPENDENT_AMBULATORY_CARE_PROVIDER_SITE_OTHER): Admitting: Neurology

## 2023-10-13 DIAGNOSIS — I739 Peripheral vascular disease, unspecified: Secondary | ICD-10-CM

## 2023-10-13 DIAGNOSIS — I442 Atrioventricular block, complete: Secondary | ICD-10-CM

## 2023-10-13 DIAGNOSIS — H8110 Benign paroxysmal vertigo, unspecified ear: Secondary | ICD-10-CM

## 2023-10-13 DIAGNOSIS — I723 Aneurysm of iliac artery: Secondary | ICD-10-CM | POA: Diagnosis not present

## 2023-10-13 DIAGNOSIS — G620 Drug-induced polyneuropathy: Secondary | ICD-10-CM | POA: Diagnosis not present

## 2023-10-13 NOTE — Progress Notes (Signed)
 Chief Complaint  Patient presents with   NCS    RM 3 alone Pt is well     ASSESSMENT AND PLAN  Penny Huynh is a 89 y.o. female   Slow Worsening lower extremity paresthesia, gait abnormality,  EMG nerve conduction study October 13, 2023, confirmed chronic right lumbosacral radiculopathy, with superimposed moderate severe axonal sensorimotor polyneuropathy, there is also evidence of moderate right carpal tunnel syndrome.  She would benefit physical therapy   DIAGNOSTIC DATA (LABS, IMAGING, TESTING) - I reviewed patient records, labs, notes, testing and imaging myself where available.   MEDICAL HISTORY:  Penny Huynh, is a 89 year old female, seen in request by Dr. Chalice for electrodiagnostic study for evaluation of his worsening gait abnormality, feet paresthesia  History is obtained from the patient and review of electronic medical records. I personally reviewed pertinent available imaging films in PACS.   PMHx of  Diabetes Hypertension Hyperlipidemia Osteoporosis Coronary artery disease Left breast cancer, status post lobectomy followed by chemoradiation therapy in 2019 Pacemaker placement Right rotator cuff surgery  Patient lives at home with her husband, independent, drove herself to clinic today, ambulate with walker  She noticed gradual onset bilateral feet and fingertips paresthesia shortly after starting chemotherapy during her breast cancer treatment, which has been persistent since then, initially she has no trouble walking, but since 2025, she noticed increased gait abnormality, walker for longer distance, worsening bilateral feet numbness tingling in the fingertips paresthesia, low back pain, lower extremity discomfort  Vascular study by Dr. Sheree in April 2025 suggest moderately decreased ABIs bilaterally consistent with peripheral vascular disease  Personally reviewed MRI of the lumbar spine September 01, 2023, moderate lumbar spondylosis, progressed  from prior area, most pronounced L4-5, severe canal stenosis, moderately severe bilateral foraminal narrowing,  Laboratory evaluations: A1c 7.4, normal TSH, hemoglobin of 11.8,  PHYSICAL EXAM:   Vitals:   10/13/23 1108  BP: (!) 147/76  Pulse: 78  Weight: 163 lb (73.9 kg)  Height: 5' 3 (1.6 m)   Body mass index is 28.87 kg/m.  PHYSICAL EXAMNIATION:  Gen: NAD, conversant, well nourised, well groomed                     Cardiovascular: Regular rate rhythm, no peripheral edema, warm, nontender. Eyes: Conjunctivae clear without exudates or hemorrhage Neck: Supple, no carotid bruits. Pulmonary: Clear to auscultation bilaterally   NEUROLOGICAL EXAM:  MENTAL STATUS: Speech/cognition: Awake, alert, oriented to history taking and casual conversation CRANIAL NERVES: CN II: Visual fields are full to confrontation. Pupils are round equal and briskly reactive to light. CN III, IV, VI: extraocular movement are normal. No ptosis. CN V: Facial sensation is intact to light touch CN VII: Face is symmetric with normal eye closure  CN VIII: Hearing is normal to causal conversation. CN IX, X: Phonation is normal. CN XI: Head turning and shoulder shrug are intact  MOTOR: Mild right more than left toe flexion extension weakness  REFLEXES: Reflexes are 1 and symmetric at the biceps, triceps, absent at knees, and ankles. Plantar responses are flexor.  SENSORY: Length-dependent decreased light touch pinprick vibratory sensation to below knee level  COORDINATION: There is no trunk or limb dysmetria noted.  GAIT/STANCE: Push-up, cautious, wide-based unsteady  REVIEW OF SYSTEMS:  Full 14 system review of systems performed and notable only for as above All other review of systems were negative.   ALLERGIES: No Known Allergies  HOME MEDICATIONS: Current Outpatient Medications  Medication Sig Dispense Refill  alendronate (FOSAMAX) 70 MG tablet Take 70 mg by mouth once a week. Takes  every Monday     amLODipine  (NORVASC ) 5 MG tablet Take 1 tablet (5 mg total) by mouth daily. 90 tablet 3   aspirin  81 MG EC tablet Take 81 mg by mouth at bedtime.     Biotin 5 MG CAPS Take 5 mg by mouth daily.     chlorthalidone  (HYGROTON ) 25 MG tablet Take 25 mg by mouth daily.     Coenzyme Q10 200 MG capsule Take 200 mg by mouth daily.     cyclobenzaprine (FLEXERIL) 10 MG tablet Take 10 mg by mouth at bedtime. (Patient not taking: Reported on 08/16/2023)     dapagliflozin  propanediol (FARXIGA ) 10 MG TABS tablet Take 1 tablet (10 mg total) by mouth daily before breakfast. (Patient not taking: Reported on 08/16/2023) 30 tablet 5   latanoprost  (XALATAN ) 0.005 % ophthalmic solution Place 1 drop into both eyes at bedtime.     losartan  (COZAAR ) 100 MG tablet Take 100 mg by mouth daily.     metFORMIN  (GLUCOPHAGE ) 500 MG tablet Take 1,000 mg by mouth daily.     pregabalin  (LYRICA ) 150 MG capsule TAKE ONE CAPSULE BY MOUTH TWICE A DAY 60 capsule 2   rosuvastatin  (CRESTOR ) 5 MG tablet Take 5 mg by mouth every other day.     vitamin B-12 (CYANOCOBALAMIN ) 1000 MCG tablet Take 1,000 mcg by mouth daily.     Vitamin D, Cholecalciferol, 25 MCG (1000 UT) CAPS Take 25 mcg by mouth daily.     No current facility-administered medications for this visit.    PAST MEDICAL HISTORY: Past Medical History:  Diagnosis Date   Arthritis    maybe a little in my left knee (02/23/2017)   Asthmatic bronchitis    Cancer of left breast (HCC) 2019   Coronary atherosclerosis    Minor nonobstructive CAD at cardiac catheterization 2009   Dyslipidemia    Femoral bruit 7/08   With normal ABIs   Hypertension    Iliac artery aneurysm (HCC)    Lower extremity edema    Chronic   Pneumonia ~ 1950   in 8th grade   Type II diabetes mellitus (HCC)     PAST SURGICAL HISTORY: Past Surgical History:  Procedure Laterality Date   APPENDECTOMY     BREAST BIOPSY Left 02/2017   CARDIAC CATHETERIZATION     long time ago  (02/23/2017)   MASTECTOMY COMPLETE / SIMPLE W/ SENTINEL NODE BIOPSY Left 02/23/2017   TOTAL MATECTOMY;  LEFT AXILLARY SENTINEL LYMPH NODE BIOPSY ERAS PATHWAY   MASTECTOMY WITH RADIOACTIVE SEED GUIDED EXCISION AND AXILLARY SENTINEL LYMPH NODE BIOPSY Left 02/23/2017   Procedure: LEFT TOTAL MATECTOMY;  LEFT AXILLARY SENTINEL LYMPH NODE BIOPSY ERAS PATHWAY;  Surgeon: Mikell Katz, MD;  Location: [facility] OR;  Service: General;  Laterality: Left;   PACEMAKER IMPLANT N/A 10/10/2020   Procedure: PACEMAKER IMPLANT;  Surgeon: Waddell Danelle ORN, MD;  Location: [facility];  Service: Cardiovascular;  Laterality: N/A;   PILONIDAL CYST EXCISION     PORT-A-CATH REMOVAL Right 09/27/2018   Procedure: REMOVAL PORT-A-CATH;  Surgeon: Gail Favorite, MD;  Location: [facility];  Service: General;  Laterality: Right;   PORTA CATH INSERTION  02/23/2017   PORTACATH PLACEMENT N/A 02/23/2017   Procedure: INSERTION PORT-A-CATH;  Surgeon: Mikell Katz, MD;  Location: [facility] OR;  Service: General;  Laterality: N/A;   SHOULDER ARTHROSCOPY W/ ROTATOR CUFF REPAIR Right     FAMILY HISTORY:  Family History  Problem Relation Age of Onset   Diabetes Mother    Stroke Father    Cancer Father        unknown type     SOCIAL HISTORY: Social History   Socioeconomic History   Marital status: Married    Spouse name: Not on file   Number of children: 2   Years of education: Not on file   Highest education level: Not on file  Occupational History   Occupation: Clinical cytogeneticist for [employer]    Comment: only works during tax season but not currently    Occupation: in Research scientist (physical sciences)    Occupation: retired Theatre manager of education     Comment: 1997  Tobacco Use   Smoking status: Former    Current packs/day: 0.00    Average packs/day: 2.0 packs/day for 46.0 years (92.1 ttl pk-yrs)    Types: Cigarettes    Start date: 11/09/1953    Quit date: 02/03/1991    Years since quitting: 32.7   Smokeless tobacco: Never  Vaping Use    Vaping status: Never Used  Substance and Sexual Activity   Alcohol  use: Yes    Alcohol /week: 2.0 standard drinks of alcohol     Types: 2 Glasses of wine per week    Comment: occasionally   Drug use: No   Sexual activity: Not on file  Other Topics Concern   Not on file  Social History Narrative   Lives in [place] with her husband.   Has 2 grown children.   10-25-17 Unable to ask abuse questions husband with her today.   Social Drivers of Corporate investment banker Strain: Not on file  Food Insecurity: Not on file  Transportation Needs: Not on file  Physical Activity: Not on file  Stress: Not on file  Social Connections: Not on file  Intimate Partner Violence: Unknown (01/28/2023)   Received from [facility]    Abuse Screen    Feels Unsafe at Home or Work/School: no    Feels Threatened by Someone: no    Abuse Screen - Does Anyone Try to Keep you from Having Contact with Others: Not on file     Abuse Screen - Physical Signs of Abuse Present: Not on file      Modena Callander, M.D. Ph.D.  [facility] [address] [place], [place] [address] Ph: [phone] Fax: [phone]  CC:  Atilano Deward ORN, MD [address] [place],  [place] [address]  Atilano Deward ORN, MD

## 2023-10-13 NOTE — Procedures (Signed)
 Full Name: Penny Huynh Gender: Female MRN #: [id number] Date of Birth: 1933-07-14    Visit Date: 10/13/2023 12:11 Age: 89 Years Examining Physician: Onita Duos Referring Physician: Dr. Chalice Height: 5 feet 4 inch History: 89 year old female with history of chemotherapy-induced peripheral neuropathy presenting with worsening lower extremity paresthesia discomfort gait abnormality  Summary of the test: Nerve conduction study: Right sural, superficial peroneal sensory responses were absent.  Right ulnar sensory response showed mildly decreased snap amplitude.  Right radial sensory responses were normal.  Right medial sensory response was absent.  Right median motor responses showed moderately prolonged distal latency with well-preserved CMAP amplitude.  Right ulnar motor responses were within normal limit.  Right peroneal to EDB motor response was absent.  Right tibial motor responses showed significantly decreased CMAP amplitude.  Electromyography: Selected needle examinations were performed at right lower extremity muscles, lumbosacral paraspinal muscles; right upper extremity muscles and cervical paraspinal muscles.  There was evidence of chronic neuropathic changes involving the right L4-5 S1 myotomes.  There is no evidence of active denervation, no spontaneous activity at right lower lumbar paraspinal muscles.   Conclusion: This is an abnormal study.  There is electrodiagnostic evidence of chronic right lumbosacral radiculopathy involving right L4-5 S1 myotomes, superimposed on moderately severe axonal sensorimotor polyneuropathy.  In addition, there is evidence of right distal median neuropathy across the wrist consistent with moderate right carpal tunnel syndrome.    ------------------------------- Duos Onita. M.D. Ph.D.   [facility] [address], [address] [place], [place] [address] Tel: [phone] Fax: [phone]  Verbal informed  consent was obtained from the patient, patient was informed of potential risk of procedure, including bruising, bleeding, hematoma formation, infection, muscle weakness, muscle pain, numbness, among others.        MNC    Nerve / Sites Muscle Latency Ref. Amplitude Ref. Rel Amp Segments Distance Velocity Ref. Area    ms ms mV mV %  cm m/s m/s mVms  R Median - APB     Wrist APB 5.8 <=4.4 6.2 >=4.0 100 Wrist - APB 7   17.9     Upper arm APB 10.9  5.0  79.9 Upper arm - Wrist 25 49 >=49 17.8  R Ulnar - ADM     Wrist ADM 3.3 <=3.3 8.3 >=6.0 100 Wrist - ADM 7   25.4     B.Elbow ADM 5.7  7.5  90.4 B.Elbow - Wrist 12 50 >=49 23.6     A.Elbow ADM 8.5  7.0  93.5 A.Elbow - B.Elbow 14 50 >=49 23.2  R Peroneal - EDB     Ankle EDB NR <=6.5 NR >=2.0 NR Ankle - EDB 9   NR         Pop fossa - Ankle      R Tibial - AH     Ankle AH 4.9 <=5.8 0.6 >=4.0 100 Ankle - AH 9   1.0     Pop fossa AH 18.1  0.4  62 Pop fossa - Ankle 45 34 >=41 0.7             SNC    Nerve / Sites Rec. Site Peak Lat Ref.  Amp Ref. Segments Distance    ms ms V V  cm  R Radial - Anatomical snuff box (Forearm)     Forearm Wrist 2.2 <=2.9 19 >=15 Forearm - Wrist 10  R Sural - Ankle (Calf)     Calf Ankle NR <=4.4  NR >=6 Calf - Ankle 14  R Superficial peroneal - Ankle     Lat leg Ankle NR <=4.4 NR >=6 Lat leg - Ankle 14  R Median - Orthodromic (Dig II, Mid palm)     Dig II Wrist NR <=3.4 NR >=10 Dig II - Wrist 13  R Ulnar - Orthodromic, (Dig V, Mid palm)     Dig V Wrist 3.1 <=3.1 4 >=5 Dig V - Wrist 36               F  Wave    Nerve F Lat Ref.   ms ms  R Tibial - AH 69.7 <=56.0  R Ulnar - ADM 29.7 <=32.0         EMG Summary Table    Spontaneous MUAP Recruitment  Muscle IA Fib PSW Fasc Other Amp Dur. Poly Pattern  L. Tibialis anterior Normal None None None _______ Normal Normal Normal Reduced  L. Tibialis posterior Normal None None None _______ Normal Normal Normal Reduced  L. Peroneus longus Normal None None None  _______ Normal Normal Normal Reduced  L. Gastrocnemius (Medial head) Normal None None None _______ Normal Normal Normal Reduced  L. Vastus lateralis Normal None None None _______ Normal Normal Normal Reduced  L. Lumbar paraspinals (low) Normal None None None _______ Normal Normal Normal Normal  L. Lumbar paraspinals (mid) Normal None None None _______ Normal Normal Normal Normal  L. First dorsal interosseous Normal None None None _______ Normal Normal Normal Normal  L. Pronator teres Normal None None None _______ Normal Normal Normal Normal  L. Extensor digitorum communis Normal None None None _______ Normal Normal Normal Normal  L. Biceps brachii Normal None None None _______ Normal Normal Normal Normal  L. Deltoid Normal None None None _______ Normal Normal Normal Normal  L. Triceps brachii Normal None None None _______ Normal Normal Normal Normal  L. Thoracic paraspinals Normal None None None _______ Normal Normal Normal Normal  L. Cervical paraspinals Normal None None None _______ Normal Normal Normal Normal

## 2023-10-21 ENCOUNTER — Ambulatory Visit (INDEPENDENT_AMBULATORY_CARE_PROVIDER_SITE_OTHER)

## 2023-10-21 ENCOUNTER — Other Ambulatory Visit: Payer: Self-pay | Admitting: Physician Assistant

## 2023-10-21 DIAGNOSIS — I442 Atrioventricular block, complete: Secondary | ICD-10-CM

## 2023-10-21 LAB — CUP PACEART REMOTE DEVICE CHECK
Battery Remaining Longevity: 74 mo
Battery Remaining Percentage: 70 %
Battery Voltage: 3.01 V
Brady Statistic AP VP Percent: 23 %
Brady Statistic AP VS Percent: 1 %
Brady Statistic AS VP Percent: 77 %
Brady Statistic AS VS Percent: 1 %
Brady Statistic RA Percent Paced: 23 %
Brady Statistic RV Percent Paced: 99 %
Date Time Interrogation Session: 20250918021117
Implantable Lead Connection Status: 753985
Implantable Lead Connection Status: 753985
Implantable Lead Implant Date: 20220908
Implantable Lead Implant Date: 20220908
Implantable Lead Location: 753859
Implantable Lead Location: 753860
Implantable Pulse Generator Implant Date: 20220908
Lead Channel Impedance Value: 480 Ohm
Lead Channel Impedance Value: 540 Ohm
Lead Channel Pacing Threshold Amplitude: 0.75 V
Lead Channel Pacing Threshold Amplitude: 0.75 V
Lead Channel Pacing Threshold Pulse Width: 0.5 ms
Lead Channel Pacing Threshold Pulse Width: 0.5 ms
Lead Channel Sensing Intrinsic Amplitude: 1.2 mV
Lead Channel Sensing Intrinsic Amplitude: 8.3 mV
Lead Channel Setting Pacing Amplitude: 2 V
Lead Channel Setting Pacing Amplitude: 2.5 V
Lead Channel Setting Pacing Pulse Width: 0.5 ms
Lead Channel Setting Sensing Sensitivity: 2 mV
Pulse Gen Model: 2272
Pulse Gen Serial Number: 6519562

## 2023-10-22 ENCOUNTER — Other Ambulatory Visit: Payer: Self-pay

## 2023-10-22 DIAGNOSIS — I739 Peripheral vascular disease, unspecified: Secondary | ICD-10-CM

## 2023-10-26 NOTE — Progress Notes (Signed)
 Remote PPM Transmission

## 2023-10-27 NOTE — Therapy (Signed)
 OUTPATIENT PHYSICAL THERAPY EVALUATION   Patient Name: Penny Huynh MRN: [id number] DOB:1933/07/29, 89 y.o., female Today's Date: 10/28/2023  END OF SESSION:  PT End of Session - 10/28/23 1657     Visit Number 1    Number of Visits 8    Date for Recertification  12/23/23    PT Start Time 1608    PT Stop Time 1700    PT Time Calculation (min) 52 min    Activity Tolerance Patient tolerated treatment well    Behavior During Therapy [facility] for tasks assessed/performed          Past Medical History:  Diagnosis Date   Arthritis    maybe a little in my left knee (02/23/2017)   Asthmatic bronchitis    Cancer of left breast (HCC) 2019   Coronary atherosclerosis    Minor nonobstructive CAD at cardiac catheterization 2009   Dyslipidemia    Femoral bruit 7/08   With normal ABIs   Hypertension    Iliac artery aneurysm    Lower extremity edema    Chronic   Pneumonia ~ 1950   in 8th grade   Type II diabetes mellitus (HCC)    Past Surgical History:  Procedure Laterality Date   APPENDECTOMY     BREAST BIOPSY Left 02/2017   CARDIAC CATHETERIZATION     long time ago (02/23/2017)   MASTECTOMY COMPLETE / SIMPLE W/ SENTINEL NODE BIOPSY Left 02/23/2017   TOTAL MATECTOMY;  LEFT AXILLARY SENTINEL LYMPH NODE BIOPSY ERAS PATHWAY   MASTECTOMY WITH RADIOACTIVE SEED GUIDED EXCISION AND AXILLARY SENTINEL LYMPH NODE BIOPSY Left 02/23/2017   Procedure: LEFT TOTAL MATECTOMY;  LEFT AXILLARY SENTINEL LYMPH NODE BIOPSY ERAS PATHWAY;  Surgeon: Mikell Katz, MD;  Location: [facility] OR;  Service: General;  Laterality: Left;   PACEMAKER IMPLANT N/A 10/10/2020   Procedure: PACEMAKER IMPLANT;  Surgeon: Waddell Danelle ORN, MD;  Location: [facility] IN[facility]AS[facility];  Service: Cardiovascular;  Laterality: N/A;   PILONIDAL CYST EXCISION     PORT-A-CATH REMOVAL Right 09/27/2018   Procedure: REMOVAL PORT-A-CATH;  Surgeon: Gail Favorite, MD;  Location: [facility];  Service: General;  Laterality: Right;    PORTA CATH INSERTION  02/23/2017   PORTACATH PLACEMENT N/A 02/23/2017   Procedure: INSERTION PORT-A-CATH;  Surgeon: Mikell Katz, MD;  Location: [facility] OR;  Service: General;  Laterality: N/A;   SHOULDER ARTHROSCOPY W/ ROTATOR CUFF REPAIR Right    Patient Active Problem List   Diagnosis Date Noted   Chronic diastolic heart failure (HCC) 05/10/2023   Iliac artery aneurysm 05/10/2023   Lumbar radiculopathy 11/26/2022   Chronic cough 11/26/2022   Pacemaker 01/14/2021   Complete heart block (HCC) 10/09/2020   Occult blood positive stool 04/17/2020   Melena 12/06/2019   PAD (peripheral artery disease) 03/17/2019   Osteoporosis 11/08/2018   Asthmatic bronchitis 07/13/2018   Hyponatremia 07/13/2018   Tenosynovitis, de Quervain 07/13/2018   Other obesity due to excess calories 03/22/2018   Chemotherapy-induced peripheral neuropathy 09/03/2017   Port-A-Cath in place 04/14/2017   Stage II breast cancer, left (HCC) 02/23/2017   Carcinoma of upper-outer quadrant of left breast in female, estrogen receptor positive (HCC) 02/10/2017   Carcinoma of upper-inner quadrant of left breast in female, estrogen receptor positive (HCC) 02/10/2017   Mixed hyperlipidemia 01/12/2016   Type 2 diabetes mellitus with diabetic neuropathy, unspecified (HCC) 01/12/2016   Chest pain 07/18/2015   Hypokalemia 07/18/2015   Vertigo 07/18/2015   Benign paroxysmal vertigo, unspecified ear 07/17/2015   Allergic  rhinitis 10/15/2011   Carpal tunnel syndrome 11/12/2010   Pure hypercholesterolemia 11/10/2010   LBBB (left bundle branch block) 05/28/2009   AODM 12/21/2007   Essential hypertension, benign 12/21/2007   DIASTOLIC DYSFUNCTION 12/21/2007   FEMORAL BRUIT, RIGHT 12/21/2007   DYSPNEA 12/21/2007    PCP: Atilano Deward ORN, MD   REFERRING PROVIDER: Onita Duos, MD   REFERRING DIAG:  G62.0,T45.1X5A (ICD-10-CM) - Chemotherapy-induced peripheral neuropathy  I44.2 (ICD-10-CM) - Complete heart block (HCC)   I72.3 (ICD-10-CM) - Iliac artery aneurysm  I73.9 (ICD-10-CM) - PAD (peripheral artery disease)  H81.10 (ICD-10-CM) - Benign paroxysmal vertigo, unspecified laterality    Rationale for Evaluation and Treatment:  Rehabiliation  THERAPY DIAG:  Other abnormalities of gait and mobility  Muscle weakness (generalized)  Other low back pain  ONSET DATE: Chronic pain, more problems since chemo in 2020   SUBJECTIVE:                                                                                                                                                                                           SUBJECTIVE STATEMENT: She noticed gradual onset bilateral feet and fingertips paresthesia since finishing chemotherapy 2020 during her breast cancer treatment, which has been persistent since then, initially she has no trouble walking, but since 2025, she noticed increased gait abnormality, walker for longer distance, worsening bilateral feet numbness tingling in the fingertips paresthesia, low back pain, lower extremity discomfort. She says surgery has been recommended for her back but she does not want to have this. She uses cane or walker to help with her balance.   PERTINENT HISTORY:  See above PMH, PACEMAKER!!!!  PAIN:  NPRS scale: 7/10 upon arrival Pain location:legs and low back Pain description: constant, can be dull and sharp Aggravating factors: Sitting too long, walking, reaching, cleaning Relieving factors: meds, has tried ice and heat which did not help   PRECAUTIONS: ,  PACEMAKER  RED FLAGS: None   WEIGHT BEARING RESTRICTIONS:  No  FALLS:  Has patient fallen in last 6 months? Yes. Number of falls 1, her knee buckled on her and she fell, she does feel her balance is not good so uses cane or walker  PLOF:  Independent with basic ADLs  PATIENT GOALS:  Reduce pain, help with balance, help with leg strength  OBJECTIVE:  Note: Objective measures were completed at  Evaluation unless otherwise noted.  DIAGNOSTIC FINDINGS:  EMG nerve conduction study October 13, 2023, confirmed chronic right lumbosacral radiculopathy, with superimposed moderate severe axonal sensorimotor polyneuropathy, there is also evidence of moderate right carpal tunnel syndrome  MRI of the lumbar spine September 01, 2023, moderate  lumbar spondylosis, progressed from prior area, most pronounced L4-5, severe canal stenosis, moderately severe bilateral foraminal narrowing,   PATIENT SURVEYS:  Patient-Specific Activity Scoring Scheme  0 represents "unable to perform." 10 represents "able to perform at prior level. 0 1 2 3 4 5 6 7 8 9  10 (Date and Score)   Activity Eval     1. reaching 5    2. laundry  5    3.     4.    5.    Score 5/10    Total score = sum of the activity scores/number of activities Minimum detectable change (90%CI) for average score = 2 points Minimum detectable change (90%CI) for single activity score = 3 points    GAIT: Eval: Assistive device utilized: uses cane and sometimes walker Level of assistance: Modified independence Comments: limited community distances    UPPER EXTREMITY MMT:  MMT Right eval Left eval  Shoulder flexion 4 4  Shoulder extension    Shoulder abduction 4 3+  Shoulder adduction    Shoulder extension    Shoulder internal rotation 5 5  Shoulder external rotation 4 4  Middle trapezius    Lower trapezius    Elbow flexion 4 4  Elbow extension 4 4  Wrist flexion    Wrist extension    Wrist ulnar deviation    Wrist radial deviation    Wrist pronation    Wrist supination    Grip strength     (Blank rows = not tested)   LUMBAR ROM:   Active  A/PROM  eval  Flexion WNL  Extension To netural only  Right lateral flexion 25%  Left lateral flexion 25%  Right rotation 50%  Left rotation 50%   (Blank rows = not tested)   LOWER EXTREMITY STRENGTH:     MMT Right eval Left eval  Hip flexion 4 4  Hip extension     Hip abduction 4 4  Hip adduction    Hip internal rotation    Hip external rotation    Knee flexion 5 5  Knee extension 4 4  Ankle dorsiflexion    Ankle plantarflexion    Ankle inversion    Ankle eversion     (Blank rows = not tested)     FUNCTIONAL TESTS:  10/28/23 TUG time, timed up and go: 22.7 seconds using [facility]  [facility] PT Assessment - 10/28/23 0001       Standardized Balance Assessment   Standardized Balance Assessment Berg Balance Test      Berg Balance Test   Sit to Stand Able to stand  independently using hands    Standing Unsupported Able to stand 2 minutes with supervision    Sitting with Back Unsupported but Feet Supported on Floor or Stool Able to sit safely and securely 2 minutes    Stand to Sit Controls descent by using hands    Transfers Able to transfer safely, definite need of hands    Standing Unsupported with Eyes Closed Able to stand 10 seconds with supervision    Standing Unsupported with Feet Together Able to place feet together independently and stand for 1 minute with supervision    From Standing, Reach Forward with Outstretched Arm Can reach forward >12 cm safely (5)    From Standing Position, Pick up Object from Floor Able to pick up shoe, needs supervision    From Standing Position, Turn to Look Behind Over each Shoulder Looks behind one side only/other side shows less weight shift  Turn 360 Degrees Needs close supervision or verbal cueing    Standing Unsupported, Alternately Place Feet on Step/Stool Needs assistance to keep from falling or unable to try    Standing Unsupported, One Foot in Front Needs help to step but can hold 15 seconds    Standing on One Leg Unable to try or needs assist to prevent fall    Total Score 33                                                                                                                                           TREATMENT DATE:  Eval HEP creation and review with demonstration and trial set  preformed, see below for details Selfcare:exam findings, recommendation to continue to use assistive device with ambulation to reduce risk of falling, importance for strength training and exercise to prolong independence levels and try to avoid surgery, but sometimes surgery is needed and pain/weakness will dictate that    PATIENT EDUCATION: Education details: HEP, PT plan of care, selfcare Person educated: Patient Education method: Explanation, Demonstration, Verbal cues, and Handouts Education comprehension: verbalized understanding, further education recommended   HOME EXERCISE PROGRAM: Access Code: [email or web address] URL: [email or web address] Date: 10/28/2023 Prepared by: Redell Moose  Exercises - Seated Bicep Curls Supinated with Dumbbells  - 2 x daily - 6 x weekly - 2 sets - 10 reps - Seated Shoulder Flexion with Dumbbells  - 2 x daily - 6 x weekly - 2 sets - 10 reps - Seated Shoulder Abduction with Dumbbells - Thumbs Up  - 2 x daily - 6 x weekly - 2 sets - 10 reps - Seated Active Straight-Leg Raise  - 2 x daily - 6 x weekly - 2 sets - 10 reps - Sit to Stand  - 2 x daily - 6 x weekly - 1 sets - 10 reps - Semi-Tandem Balance at [employer] Eyes Open  - 2 x daily - 6 x weekly - 1 sets - 3 reps - 15 sec hold - Corner Balance Feet Together: Eyes Open With Head Turns  - 2 x daily - 6 x weekly - 1 sets - 10 reps - Wide Stance with Head Nods and Counter Support  - 2 x daily - 6 x weekly - 1 sets - 10 reps  ASSESSMENT:  CLINICAL IMPRESSION: RICHARD HOLZ is a 89 y.o. female  with gait abnormality, poor balance, general weakness and chronic back pain with severe spinal stenosis. EMG nerve conduction study October 13, 2023, confirmed chronic right lumbosacral radiculopathy, with superimposed moderate severe axonal sensorimotor polyneuropathy, there is also evidence of moderate right carpal tunnel syndrome.. Patient will benefit from skilled PT to improve overall function and to  address impairments and limitations listed below.  OBJECTIVE IMPAIRMENTS: decreased activity tolerance for ADL's, difficulty walking, decreased balance, decreased endurance, decreased mobility, decreased ROM, decreased strength, impaired flexibility, impaired UE/LE use,  and pain.  ACTIVITY LIMITATIONS: bending, liftting, walking, standing, cleaning, community activity,  reaching, carry  PERSONAL FACTORS: see above PMH  also affecting patient's functional outcome.  REHAB POTENTIAL: Good  CLINICAL DECISION MAKING: Evolving/moderate complexity  EVALUATION COMPLEXITY: Moderate    GOALS: Short term PT Goals Target date: 11/25/2023   Pt will be I and compliant with HEP. Baseline:  Goal status: New Pt will decrease pain by 25% overall Baseline: Goal status: New  Long term PT goals Target date:12/23/2023   Pt will improve TUG time to less than 16 seconds to show improved gait speed and balance Baseline:26 Goal status: New Pt will improve  strength to at least 4+/5 MMT to improve functional strength Baseline: Goal status: New Pt will improve Patient specific functional scale (PSFS) to at least 7/10 to show improved function level Baseline:5/10 Goal status: New Pt will reduce pain to overall less than 3/10 with usual activity and work activity. Baseline: Goal status: New Pt will improve BERG balance test to at least 40 points to show improved balance.  Baseline:33 Goal status: New  PLAN: PT FREQUENCY: 1-2 times per week   PT DURATION: 6-8 weeks  PLANNED INTERVENTIONS  97110-Therapeutic exercises, 97530- Therapeutic activity, 97112- Neuromuscular re-education, 97535- Self Care, [id number]- Manual therapy, and Patient/Family education  PLAN FOR NEXT SESSION: PACEMAKER so no electric modalaties, review HEP, work on gait, balance, strength/endurance  Redell JONELLE Moose, PT,DPT 10/28/2023, 4:58 PM

## 2023-10-28 ENCOUNTER — Inpatient Hospital Stay: Payer: Self-pay | Attending: Nurse Practitioner | Admitting: Nurse Practitioner

## 2023-10-28 ENCOUNTER — Encounter: Payer: Self-pay | Admitting: Hematology

## 2023-10-28 ENCOUNTER — Other Ambulatory Visit: Payer: Self-pay | Admitting: Nurse Practitioner

## 2023-10-28 ENCOUNTER — Encounter: Payer: Self-pay | Admitting: Physical Therapy

## 2023-10-28 ENCOUNTER — Ambulatory Visit: Attending: Neurology | Admitting: Physical Therapy

## 2023-10-28 ENCOUNTER — Inpatient Hospital Stay

## 2023-10-28 ENCOUNTER — Telehealth: Payer: Self-pay | Admitting: Nurse Practitioner

## 2023-10-28 VITALS — BP 136/59 | HR 64 | Temp 98.0°F | Resp 17 | Ht 63.0 in | Wt 162.9 lb

## 2023-10-28 DIAGNOSIS — M5459 Other low back pain: Secondary | ICD-10-CM | POA: Insufficient documentation

## 2023-10-28 DIAGNOSIS — M6281 Muscle weakness (generalized): Secondary | ICD-10-CM | POA: Diagnosis not present

## 2023-10-28 DIAGNOSIS — Z08 Encounter for follow-up examination after completed treatment for malignant neoplasm: Secondary | ICD-10-CM | POA: Diagnosis not present

## 2023-10-28 DIAGNOSIS — R0789 Other chest pain: Secondary | ICD-10-CM | POA: Insufficient documentation

## 2023-10-28 DIAGNOSIS — R2689 Other abnormalities of gait and mobility: Secondary | ICD-10-CM | POA: Insufficient documentation

## 2023-10-28 DIAGNOSIS — E876 Hypokalemia: Secondary | ICD-10-CM | POA: Diagnosis not present

## 2023-10-28 DIAGNOSIS — C50212 Malignant neoplasm of upper-inner quadrant of left female breast: Secondary | ICD-10-CM

## 2023-10-28 DIAGNOSIS — I723 Aneurysm of iliac artery: Secondary | ICD-10-CM | POA: Insufficient documentation

## 2023-10-28 DIAGNOSIS — G620 Drug-induced polyneuropathy: Secondary | ICD-10-CM | POA: Insufficient documentation

## 2023-10-28 DIAGNOSIS — H8110 Benign paroxysmal vertigo, unspecified ear: Secondary | ICD-10-CM | POA: Insufficient documentation

## 2023-10-28 DIAGNOSIS — E871 Hypo-osmolality and hyponatremia: Secondary | ICD-10-CM | POA: Insufficient documentation

## 2023-10-28 DIAGNOSIS — C50412 Malignant neoplasm of upper-outer quadrant of left female breast: Secondary | ICD-10-CM

## 2023-10-28 DIAGNOSIS — Z170 Estrogen receptor positive status [ER+]: Secondary | ICD-10-CM | POA: Diagnosis not present

## 2023-10-28 DIAGNOSIS — Z853 Personal history of malignant neoplasm of breast: Secondary | ICD-10-CM | POA: Diagnosis not present

## 2023-10-28 DIAGNOSIS — T451X5A Adverse effect of antineoplastic and immunosuppressive drugs, initial encounter: Secondary | ICD-10-CM | POA: Insufficient documentation

## 2023-10-28 DIAGNOSIS — I442 Atrioventricular block, complete: Secondary | ICD-10-CM | POA: Insufficient documentation

## 2023-10-28 DIAGNOSIS — I739 Peripheral vascular disease, unspecified: Secondary | ICD-10-CM | POA: Insufficient documentation

## 2023-10-28 LAB — CBC WITH DIFFERENTIAL (CANCER CENTER ONLY)
Abs Immature Granulocytes: 0.01 K/uL (ref 0.00–0.07)
Basophils Absolute: 0 K/uL (ref 0.0–0.1)
Basophils Relative: 0 %
Eosinophils Absolute: 0 K/uL (ref 0.0–0.5)
Eosinophils Relative: 0 %
HCT: 35.4 % — ABNORMAL LOW (ref 36.0–46.0)
Hemoglobin: 11.7 g/dL — ABNORMAL LOW (ref 12.0–15.0)
Immature Granulocytes: 0 %
Lymphocytes Relative: 14 %
Lymphs Abs: 1 K/uL (ref 0.7–4.0)
MCH: 28.9 pg (ref 26.0–34.0)
MCHC: 33.1 g/dL (ref 30.0–36.0)
MCV: 87.4 fL (ref 80.0–100.0)
Monocytes Absolute: 0.3 K/uL (ref 0.1–1.0)
Monocytes Relative: 4 %
Neutro Abs: 5.4 K/uL (ref 1.7–7.7)
Neutrophils Relative %: 82 %
Platelet Count: 230 K/uL (ref 150–400)
RBC: 4.05 MIL/uL (ref 3.87–5.11)
RDW: 13.2 % (ref 11.5–15.5)
WBC Count: 6.7 K/uL (ref 4.0–10.5)
nRBC: 0 % (ref 0.0–0.2)

## 2023-10-28 LAB — CMP (CANCER CENTER ONLY)
ALT: 13 U/L (ref 0–44)
AST: 21 U/L (ref 15–41)
Albumin: 4.4 g/dL (ref 3.5–5.0)
Alkaline Phosphatase: 41 U/L (ref 38–126)
Anion gap: 9 (ref 5–15)
BUN: 25 mg/dL — ABNORMAL HIGH (ref 8–23)
CO2: 29 mmol/L (ref 22–32)
Calcium: 9.6 mg/dL (ref 8.9–10.3)
Chloride: 91 mmol/L — ABNORMAL LOW (ref 98–111)
Creatinine: 0.88 mg/dL (ref 0.44–1.00)
GFR, Estimated: >60 mL/min (ref >60)
Glucose, Bld: 160 mg/dL — ABNORMAL HIGH (ref 70–99)
Potassium: 3.3 mmol/L — ABNORMAL LOW (ref 3.5–5.1)
Sodium: 129 mmol/L — ABNORMAL LOW (ref 135–145)
Total Bilirubin: 1 mg/dL (ref 0.0–1.2)
Total Protein: 7 g/dL (ref 6.5–8.1)

## 2023-10-28 NOTE — Telephone Encounter (Signed)
 I attempted to schedule Penny Huynh for her lab appointment before her survivorship appt. Due to [place] living in [place], she stated that she could not make it by 10:30 and that she needs her lab scheduled after her appointment.

## 2023-10-28 NOTE — Assessment & Plan Note (Addendum)
 carcinoma of the upper-outer quadrant of left breast and female, ER/PR positive, HER-2 negative pT2N0M0, G2, Stage 1B; IDC of the upper-inner quadrant left breast, ER+/PR+/HER2+, pT3N0M0 G3, stage IB -Diagnosed in 01/2017, s/p left mastectomy 02/23/2017, adjuvant chemo and Herceptin  and adjuvant radiation -Began antiestrogen with anastrozole  in 10/2017, tolerating well.  She has completed 5 years and can stop now -Ms. Dalgleish is clinically doing well.  Exam shows 2 separate densities in the right upper breast, known category D. She was referred for right mammo and ultrasound to rule out anything more concerning. Otherwise benign exam.  - 10/28/2023 -labs show hyponatremia and hypokalemia with normal renal functions.  Blood count unremarkable.  These are managed per primary care.  Unilateral screening mammogram scheduled on 11/03/2023.  Will get chest wall ultrasound for further evaluation of nodularity along surgical scar from mastectomy. Plan to follow-up in 1 year with labs, sooner if needed.

## 2023-10-28 NOTE — Progress Notes (Signed)
 Patient Care Team: Sasser, Deward ORN, MD as PCP - General (Family Medicine) Mallipeddi, Diannah SQUIBB, MD as PCP - Cardiology (Cardiology) Lanny Callander, MD as Consulting Physician (Hematology) Burton, Lacie K, NP as Nurse Practitioner (Nurse Practitioner) Charls Pearla LABOR, MD (Inactive) as Attending Physician (Cardiology) Darlean Ozell NOVAK, MD as Consulting Physician (Pulmonary Disease)  Clinic Day:  10/28/2023  Referring physician: Atilano Deward ORN, MD  ASSESSMENT & PLAN:   Assessment & Plan: Carcinoma of upper-outer quadrant of left breast in female, estrogen receptor positive (HCC) carcinoma of the upper-outer quadrant of left breast and female, ER/PR positive, HER-2 negative pT2N0M0, G2, Stage 1B; IDC of the upper-inner quadrant left breast, ER+/PR+/HER2+, pT3N0M0 G3, stage IB -Diagnosed in 01/2017, s/p left mastectomy 02/23/2017, adjuvant chemo and Herceptin  and adjuvant radiation -Began antiestrogen with anastrozole  in 10/2017, tolerating well.  She has completed 5 years and can stop now -Ms. Nikkel is clinically doing well.  Exam shows 2 separate densities in the right upper breast, known category D. She was referred for right mammo and ultrasound to rule out anything more concerning. Otherwise benign exam.  - 10/28/2023 -labs show hyponatremia and hypokalemia with normal renal functions.  Blood count unremarkable.  These are managed per primary care.  Unilateral screening mammogram scheduled on 11/03/2023.  Will get chest wall ultrasound for further evaluation of nodularity along surgical scar from mastectomy. Plan to follow-up in 1 year with labs, sooner if needed.   Left chest tenderness Patient mentions left breast pain and skin peeling along the surgical site of her left chest.  Denies new palpable lumps or masses.  Concerned about recurrence since she has had breast cancer twice on the left side.  Will order left breast ultrasound for further evaluation of area of concern.  Hypokalemia and  hyponatremia Likely from dehydration although, renal functions are normal.  She is followed by primary care very closely.  They are managing this concern.  Plan Labs reviewed.  - CBC and CMP are unremarkable. Scheduled for right breast screening mammogram on 11/03/2023. Ultrasound of left chest wall along surgical site ordered during today's visit. Continue breast cancer surveillance. Labs and follow-up in 1 year, sooner if needed.   The patient understands the plans discussed today and is in agreement with them.  She knows to contact our office if she develops concerns prior to her next appointment.  I provided 25 minutes of face-to-face time during this encounter and > 50% was spent counseling as documented under my assessment and plan.    Powell FORBES Lessen, NP  [facility] - A DEPT OF [facility] [address] [place] [address] Dept: [phone] Dept Fax: [phone]   Orders Placed This Encounter  Procedures   US  LIMITED ULTRASOUND INCLUDING AXILLA LEFT BREAST     Standing Status:   Future    Expected Date:   11/01/2023    Expiration Date:   10/27/2024    Scheduling Instructions:     Patient is scheduled for right screening mammogram on that date. Could she have ultrasound at the same time? Thank you.    Reason for Exam (SYMPTOM  OR DIAGNOSIS REQUIRED):   pain from mastectomy scar across left chest wall with axillary swelling    Preferred [facility]      CHIEF COMPLAINT:  CC: Left breast circumareolar ER +  Current Treatment:  anastrozole  daily (starting 2019)  INTERVAL HISTORY:  Randie  is here today for repeat clinical assessment. She last saw Lacie, NP on 10/22/2023.  She has noticed recent skin peeling drying around surgical scar of left mastectomy along with pain at the surgical scar.  She has not noticed palpable lumps or masses along the chest wall on the left breast.   She denies new palpable masses or lumps in the right breast.  She reports doing well otherwise.  She denies chest pain, chest pressure, or shortness of breath. She denies headaches or visual disturbances. She denies abdominal pain, nausea, vomiting, or changes in bowel or bladder habits.  She denies fevers or chills. She denies pain. Her appetite is good. Her weight has been stable.  I have reviewed the past medical history, past surgical history, social history and family history with the patient and they are unchanged from previous note.  ALLERGIES:  has no known allergies.  MEDICATIONS:  Current Outpatient Medications  Medication Sig Dispense Refill   alendronate (FOSAMAX) 70 MG tablet Take 70 mg by mouth once a week. Takes every Monday     amLODipine  (NORVASC ) 5 MG tablet Take 1 tablet (5 mg total) by mouth daily. 90 tablet 3   aspirin  81 MG EC tablet Take 81 mg by mouth at bedtime.     Biotin 5 MG CAPS Take 5 mg by mouth daily.     chlorthalidone  (HYGROTON ) 25 MG tablet Take 25 mg by mouth daily.     Coenzyme Q10 200 MG capsule Take 200 mg by mouth daily.     cyclobenzaprine (FLEXERIL) 10 MG tablet Take 10 mg by mouth at bedtime.     latanoprost  (XALATAN ) 0.005 % ophthalmic solution Place 1 drop into both eyes at bedtime.     losartan  (COZAAR ) 100 MG tablet Take 100 mg by mouth daily.     metFORMIN  (GLUCOPHAGE ) 500 MG tablet Take 1,000 mg by mouth daily.     pregabalin  (LYRICA ) 150 MG capsule TAKE ONE CAPSULE BY MOUTH TWICE A DAY 60 capsule 2   rosuvastatin  (CRESTOR ) 5 MG tablet Take 5 mg by mouth every other day.     vitamin B-12 (CYANOCOBALAMIN ) 1000 MCG tablet Take 1,000 mcg by mouth daily.     Vitamin D, Cholecalciferol, 25 MCG (1000 UT) CAPS Take 25 mcg by mouth daily.     No current facility-administered medications for this visit.    HISTORY OF PRESENT ILLNESS:   Oncology History Overview Note  Cancer Staging Carcinoma of upper-inner quadrant of left breast in female,  estrogen receptor positive (HCC) Staging form: Breast, AJCC 8th Edition - Clinical stage from 01/18/2017: Stage IB (cT2, cN0, cM0, G3, ER: Positive, PR: Positive, HER2: Positive) - Signed by Lanny Callander, MD on 02/10/2017 - Pathologic stage from 02/23/2017: Stage IB (pT3, pN0, cM0, G3, ER: Positive, PR: Positive, HER2: Positive) - Signed by Ann Mayme POUR, NP on 03/16/2017  Carcinoma of upper-outer quadrant of left breast in female, estrogen receptor positive (HCC) Staging form: Breast, AJCC 8th Edition - Clinical stage from 01/18/2017: Stage IB (cT2, cN0, cM0, G2, ER: Positive, PR: Positive, HER2: Negative) - Unsigned - Pathologic stage from 02/23/2017: Stage IB (pT2, pN0(sn), cM0, G3, ER: Positive, PR: Positive, HER2: Negative) - Signed by Ann Mayme POUR, NP on 03/16/2017     Carcinoma of upper-outer quadrant of left breast in female, estrogen receptor positive (HCC)  01/13/2017 Mammogram   FINDINGS: In the left breast, a spiculated mass lies in the upper outer quadrant. In the medial posterior left breast, there is  a larger lobulated mass. These masses correspond to the palpable abnormalities. No other discrete left breast masses.   In the right breast, there are no discrete masses. There are no areas of architectural distortion.   In both breasts there are scattered calcifications, rounded punctate, with a few other larger coarse dystrophic calcifications, without significant change from the previous screening mammogram, which is dated 12/04/2009.   Mammographic images were processed with CAD.   On physical exam, there is a firm palpable mass in the upper outer left breast and another firm mass in the medial left breast.     01/13/2017 Breast US    Targeted ultrasound is performed, showing an irregular hypoechoic shadowing mass in the left breast at the 2:30 o'clock position, 5 cm the nipple, measuring 3.3 x 2.8 x 2.6 cm. In the 10:30 o'clock position of the left breast, 5 cm the  nipple, there is irregular hypoechoic mass with somewhat more lobulated margins, corresponding to the lobulated mass seen mammographically. This measures 4.8 x 2.9 x 4.8 cm. Both masses show internal vascularity on color Doppler analysis.   Sonographic evaluation of the left axilla shows several nodes with thickened cortices. Status cortex measured is 5 mm. None of these lymph nodes appear enlarged but overall size criteria.   IMPRESSION: 1. Two masses in the left breast, 1 at the 2:30 o'clock position in the other at the 10:30 o'clock position, both highly suspicious for breast carcinoma. 2. Several abnormal left axillary lymph nodes with thickened cortices. 3. No evidence of right breast malignancy.     01/18/2017 Pathology Results   Diagnosis 1. Breast, left, needle core biopsy, 2:30 o'clock - INVASIVE MAMMARY CARCINOMA - MAMMARY CARCINOMA IN-SITU - SEE COMMENT 2. Lymph node, needle/core biopsy, left axilla - NO CARCINOMA IDENTIFIED - SEE COMMENT 3. Breast, left, needle core biopsy, 10:30 o'clock - INVASIVE DUCTAL CARCINOMA - SEE COMMENT  1. PROGNOSTIC INDICATORS Results: IMMUNOHISTOCHEMICAL AND MORPHOMETRIC ANALYSIS PERFORMED MANUALLY Estrogen Receptor: 100%, POSITIVE, STRONG STAINING INTENSITY Progesterone Receptor: 60%, POSITIVE, STRONG STAINING INTENSITY Proliferation Marker Ki67: 60%  1. FLUORESCENCE IN-SITU HYBRIDIZATION Results: HER2 - NEGATIVE RATIO OF HER2/CEP17 SIGNALS 1.28 AVERAGE HER2 COPY NUMBER PER CELL 2.75  3. PROGNOSTIC INDICATORS Results: IMMUNOHISTOCHEMICAL AND MORPHOMETRIC ANALYSIS PERFORMED MANUALLY Estrogen Receptor: 100%, POSITIVE, STRONG STAINING INTENSITY Progesterone Receptor: 50%, POSITIVE, STRONG STAINING INTENSITY Proliferation Marker Ki67: 70%  3. FLUORESCENCE IN-SITU HYBRIDIZATION Results: HER2 - **POSITIVE** RATIO OF HER2/CEP17 SIGNALS 2.80 AVERAGE HER2 COPY NUMBER PER CELL 6.15  Microscopic Comment 1. The biopsy material  shows an infiltrative proliferation of cells with large vesicular nuclei with inconspicuous nucleoli, arranged linearly and in small clusters. Based on the biopsy, the carcinoma appears Nottingham grade 2 of 3 and measures 0.9 cm in greatest linear extent. E-cadherin and prognostic markers (ER/PR/ki-67/HER2-FISH)are pending and will be reported in an addendum. Dr. Guinevere reviewed the case and agrees with the above diagnosis.   02/10/2017 Initial Diagnosis   Carcinoma of upper-outer quadrant of left breast in female, estrogen receptor positive (HCC)   02/19/2017 Imaging   Bone Scan whole Body 02/19/17 IMPRESSION: Negative for evidence of bony metastatic disease.   02/19/2017 Imaging   CT CAP W Contrast 02/19/17 IMPRESSION: 1. Small right middle lobe pulmonary nodules up to 0.8 cm, nonspecific. Non-contrast chest CT at 3-6 months is recommended. If the nodules are stable at time of repeat CT, then future CT at 18-24 months (from today's scan) is considered optional for low-risk patients, but is recommended for high-risk patients. This recommendation follows the  consensus statement: Guidelines for Management of Incidental Pulmonary Nodules Detected on CT Images: From the Fleischner Society 2017; Radiology 2017; 284:228-243. 2. Subcentimeter hepatic lesions likely cysts but incompletely characterized due to small size. Attention on follow-up imaging recommended. 3. 2.1 cm fusiform right common iliac artery aneurysm. Continued surveillance recommended. 4. Aortic Atherosclerosis (ICD10-I70.0) and Emphysema (ICD10-J43.9).   02/23/2017 Pathology Results   Diagnosis 1. Breast, simple mastectomy, Left Total - INVASIVE DUCTAL CARCINOMA, MULTIFOCAL, NOTTINGHAM GRADE 3/3 (5.3 CM, 3.5 CM) - INVASIVE CARCINOMA INVOLVES THE DERMIS - DUCTAL CARCINOMA IN SITU, INTERMEDIATE GRADE - HYALINIZED FIBROADENOMA - MARGINS UNINVOLVED BY CARCINOMA - NO CARCINOMA IDENTIFIED IN TWO LYMPH NODES (0/2) - SEE ONCOLOGY  TABLE AND COMMENT BELOW 2. Lymph node, sentinel, biopsy, Left Axillary - NO CARCINOMA IDENTIFIED IN ONE LYMPH NODE (0/1) 3. Lymph node, sentinel, biopsy - NO CARCINOMA IDENTIFIED IN ONE LYMPH NODE (0/1) 4. Lymph node, sentinel, biopsy - NO CARCINOMA IDENTIFIED IN ONE LYMPH NODE (0/1) 5. Lymph node, sentinel, biopsy - NO CARCINOMA IDENTIFIED IN ONE LYMPH NODE (0/1) Microscopic Comment 1. BREAST, INVASIVE TUMOR Procedure: Simple mastectomy Laterality: Left Tumor Size: 5. 3 cm, 3.5 cm Histologic Type: Invasive carcinoma of no special type (ductal, not otherwise specified) Grade: Nottingham Grade 3 Tubular Differentiation: 3 Nuclear Pleomorphism: 3 Mitotic Count: 2 Ductal Carcinoma in Situ (DCIS): Present Extent of Tumor: Skin: Invasive carcinoma directly invades into the dermis or epidermis without skin ulceration Margins: Invasive carcinoma, distance from closest margin: 1.5 cm (posterior) DCIS, distance from closest margin: > 1 cm Regional Lymph Nodes: Number of Lymph Nodes Examined: 6 Number of Sentinel Lymph Nodes Examined: 4 Lymph Nodes with Macrometastases: 0 Lymph Nodes with Micrometastases: 0 Lymph Nodes with Isolated Tumor Cells: 0 Treatment effect: No known presurgical therapy Breast Prognostic Profile: See Also ([id number]) Estrogen Receptor: Positive (100%, strong); Positive (100%, strong) Progesterone Receptor: Positive (50%, strong); Positive (50%, strong) Her2: Positive (Ratio 2.80); Negative (Ratio 1.28) Ki-67: 70%; 60% Best tumor block for sendout testing: 1E Pathologic Stage Classification (pTNM, AJCC 8th Edition): Primary Tumor: mpT3 Regional Lymph Nodes: pN0 COMMENT: The two invasive carcinomas in the breast have slightly different morphologies. The larger lesion has a papillary and micropapillary pattern admixed with typical ductal carcinoma while the smaller lesion is more typical of a ductal lesion with linear arrays (E-cadherin performed on biopsy).  There are foci within the larger lesion that are concerning for lymphovascular space invasion.    04/07/2017 - 04/27/2018 Chemotherapy   weekly taxol  and herceptin  x12 weeks, 04/07/17-06/23/17. Then herceptin  q3 weeks for total 1 year which was switched to Kadcyla  q3weeks on 07/14/17. Stopped after 2 doses due to worsening neuropathy. Switched back to maintenance Herceptin  started 8/12019. Completed on 04/27/18.    07/20/2017 - 09/03/2017 Radiation Therapy   She started adjuvant radiation on 07/20/17 with Dr. Dewey and plans to complete on 09/03/17.    10/2017 -  Anti-estrogen oral therapy   Anastrozole  1mg  daily starting 10/2017   09/27/2018 Procedure   She had her PAC removed on 09/27/18.        REVIEW OF SYSTEMS:   Constitutional: Denies fevers, chills or abnormal weight loss Eyes: Denies blurriness of vision Ears, nose, mouth, throat, and face: Denies mucositis or sore throat Respiratory: Denies cough, dyspnea or wheezes Cardiovascular: Denies palpitation, chest discomfort or lower extremity swelling Gastrointestinal:  Denies nausea, heartburn or change in bowel habits Skin: Denies abnormal skin rashes Lymphatics: Denies new lymphadenopathy or easy bruising Neurological:Denies numbness, tingling or new weaknesses Behavioral/Psych: Mood is  stable, no new changes  All other systems were reviewed with the patient and are negative.   VITALS:   Today's Vitals   10/28/23 1113 10/28/23 1144  BP: (!) 136/59   Pulse: 64   Resp: 17   Temp: 98 F (36.7 C)   TempSrc: Temporal   SpO2: 100%   Weight: 162 lb 14.4 oz (73.9 kg)   Height: 5' 3 (1.6 m)   PainSc:  5    Body mass index is 28.86 kg/m.   Wt Readings from Last 3 Encounters:  10/28/23 162 lb 14.4 oz (73.9 kg)  10/13/23 163 lb (73.9 kg)  08/16/23 163 lb 9.6 oz (74.2 kg)    Body mass index is 28.86 kg/m.  Performance status (ECOG): 1 - Symptomatic but completely ambulatory  PHYSICAL EXAM:   GENERAL:alert, no distress and  comfortable SKIN: skin color, texture, turgor are normal, no rashes or significant lesions EYES: normal, Conjunctiva are pink and non-injected, sclera clear OROPHARYNX:no exudate, no erythema and lips, buccal mucosa, and tongue normal  NECK: supple, thyroid  normal size, non-tender, without nodularity LYMPH:  no palpable lymphadenopathy in the cervical, axillary or inguinal LUNGS: clear to auscultation and percussion with normal breathing effort HEART: regular rate & rhythm and no murmurs and no lower extremity edema ABDOMEN:abdomen soft, non-tender and normal bowel sounds Musculoskeletal:no cyanosis of digits and no clubbing  NEURO: alert & oriented x 3 with fluent speech, no focal motor/sensory deficits BREAST: The right breast is without palpable masses or lumps.  There is no nipple inversion or nipple discharge.  There is no axillary lymphadenopathy on the right.  Left breast is surgically absent.  There is mild nodularity along surgical scar.  Tender with palpation.  No other abnormalities noted.  No axillary lymphadenopathy on the left.  LABORATORY DATA:  I have reviewed the data as listed    Component Value Date/Time   NA 129 (L) 10/28/2023 1234   NA 133 (L) 08/16/2023 1210   K 3.3 (L) 10/28/2023 1234   CL 91 (L) 10/28/2023 1234   CO2 29 10/28/2023 1234   GLUCOSE 160 (H) 10/28/2023 1234   BUN 25 (H) 10/28/2023 1234   BUN 16 08/16/2023 1210   CREATININE 0.88 10/28/2023 1234   CALCIUM  9.6 10/28/2023 1234   PROT 7.0 10/28/2023 1234   PROT 6.4 08/16/2023 1210   ALBUMIN 4.4 10/28/2023 1234   ALBUMIN 4.2 08/16/2023 1210   AST 21 10/28/2023 1234   ALT 13 10/28/2023 1234   ALKPHOS 41 10/28/2023 1234   BILITOT 1.0 10/28/2023 1234   GFRNONAA >60 10/28/2023 1234   GFRAA >60 10/06/2019 1014    Lab Results  Component Value Date   WBC 6.7 10/28/2023   NEUTROABS 5.4 10/28/2023   HGB 11.7 (L) 10/28/2023   HCT 35.4 (L) 10/28/2023   MCV 87.4 10/28/2023   PLT 230 10/28/2023      RADIOGRAPHIC STUDIES: MM 3D SCREENING MAMMOGRAM UNILATERAL RIGHT BREAST Result Date: 11/03/2023 CLINICAL DATA:  Screening. EXAM: DIGITAL SCREENING UNILATERAL RIGHT MAMMOGRAM WITH CAD AND TOMOSYNTHESIS TECHNIQUE: Right screening digital craniocaudal and mediolateral oblique mammograms were obtained. Right screening digital breast tomosynthesis was performed. The images were evaluated with computer-aided detection. COMPARISON:  Previous exam(s). ACR Breast Density Category d: The breasts are extremely dense, which lowers the sensitivity of mammography. FINDINGS: The patient has had a left mastectomy. There are no findings suspicious for malignancy. IMPRESSION: No mammographic evidence of malignancy. A result letter of this screening mammogram will be mailed directly to  the patient. RECOMMENDATION: Screening mammogram in one year.  (Code:[id number]-R-76M) BI-RADS CATEGORY  1: Negative. Electronically Signed   By: Curtistine Noble   On: 11/03/2023 12:41   CUP PACEART REMOTE DEVICE CHECK Result Date: 10/21/2023 PPM Scheduled remote reviewed. Normal device function.  Presenting rhythm: AS/VP 6sec AMS EGM c/w PAT Next remote 91 days. LA, CVRS  NCV with EMG(electromyography) Result Date: 10/13/2023 Onita Duos, MD     10/13/2023  5:28 PM     Full Name: Maymuna Detzel Gender: Female MRN #: [id number] Date of Birth: [birth year]   Visit Date: 10/13/2023 12:11 Age: 89 Years Examining Physician: Onita Duos Referring Physician: Dr. Chalice Height: 5 feet 4 inch History: 89 year old female with history of chemotherapy-induced peripheral neuropathy presenting with worsening lower extremity paresthesia discomfort gait abnormality Summary of the test: Nerve conduction study: Right sural, superficial peroneal sensory responses were absent. Right ulnar sensory response showed mildly decreased snap amplitude.  Right radial sensory responses were normal. Right medial sensory response was absent.  Right median motor responses showed  moderately prolonged distal latency with well-preserved CMAP amplitude. Right ulnar motor responses were within normal limit. Right peroneal to EDB motor response was absent. Right tibial motor responses showed significantly decreased CMAP amplitude. Electromyography: Selected needle examinations were performed at right lower extremity muscles, lumbosacral paraspinal muscles; right upper extremity muscles and cervical paraspinal muscles. There was evidence of chronic neuropathic changes involving the right L4-5 S1 myotomes.  There is no evidence of active denervation, no spontaneous activity at right lower lumbar paraspinal muscles. Conclusion: This is an abnormal study.  There is electrodiagnostic evidence of chronic right lumbosacral radiculopathy involving right L4-5 S1 myotomes, superimposed on moderately severe axonal sensorimotor polyneuropathy.  In addition, there is evidence of right distal median neuropathy across the wrist consistent with moderate right carpal tunnel syndrome. ------------------------------- Duos Onita. M.D. Ph.D. [facility] [address] [place], [place] [address] Tel: [phone] Fax: [phone] Verbal informed consent was obtained from the patient, patient was informed of potential risk of procedure, including bruising, bleeding, hematoma formation, infection, muscle weakness, muscle pain, numbness, among others.     MNC   Nerve / Sites Muscle Latency Ref. Amplitude Ref. Rel Amp Segments Distance Velocity Ref. Area   ms ms mV mV %  cm m/s m/s mVms R Median - APB    Wrist APB 5.8 <=4.4 6.2 >=4.0 100 Wrist - APB 7   17.9    Upper arm APB 10.9  5.0  79.9 Upper arm - Wrist 25 49 >=49 17.8 R Ulnar - ADM    Wrist ADM 3.3 <=3.3 8.3 >=6.0 100 Wrist - ADM 7   25.4    B.Elbow ADM 5.7  7.5  90.4 B.Elbow - Wrist 12 50 >=49 23.6    A.Elbow ADM 8.5  7.0  93.5 A.Elbow - B.Elbow 14 50 >=49 23.2 R Peroneal - EDB    Ankle EDB NR <=6.5 NR >=2.0 NR Ankle - EDB 9   NR        Pop  fossa - Ankle     R Tibial - AH    Ankle AH 4.9 <=5.8 0.6 >=4.0 100 Ankle - AH 9   1.0    Pop fossa AH 18.1  0.4  62 Pop fossa - Ankle 45 34 >=41 0.7           SNC   Nerve / Sites Rec. Site Peak Lat Ref.  Amp Ref. Segments Distance   ms ms V V  cm R Radial - Anatomical snuff box (Forearm)    Forearm Wrist 2.2 <=2.9 19 >=15 Forearm - Wrist 10 R Sural - Ankle (Calf)    Calf Ankle NR <=4.4 NR >=6 Calf - Ankle 14 R Superficial peroneal - Ankle    Lat leg Ankle NR <=4.4 NR >=6 Lat leg - Ankle 14 R Median - Orthodromic (Dig II, Mid palm)    Dig II Wrist NR <=3.4 NR >=10 Dig II - Wrist 13 R Ulnar - Orthodromic, (Dig V, Mid palm)    Dig V Wrist 3.1 <=3.1 4 >=5 Dig V - Wrist 57             F  Wave   Nerve F Lat Ref.  ms ms R Tibial - AH 69.7 <=56.0 R Ulnar - ADM 29.7 <=32.0       EMG Summary Table   Spontaneous MUAP Recruitment Muscle IA Fib PSW Fasc Other Amp Dur. Poly Pattern L. Tibialis anterior Normal None None None _______ Normal Normal Normal Reduced L. Tibialis posterior Normal None None None _______ Normal Normal Normal Reduced L. Peroneus longus Normal None None None _______ Normal Normal Normal Reduced L. Gastrocnemius (Medial head) Normal None None None _______ Normal Normal Normal Reduced L. Vastus lateralis Normal None None None _______ Normal Normal Normal Reduced L. Lumbar paraspinals (low) Normal None None None _______ Normal Normal Normal Normal L. Lumbar paraspinals (mid) Normal None None None _______ Normal Normal Normal Normal L. First dorsal interosseous Normal None None None _______ Normal Normal Normal Normal L. Pronator teres Normal None None None _______ Normal Normal Normal Normal L. Extensor digitorum communis Normal None None None _______ Normal Normal Normal Normal L. Biceps brachii Normal None None None _______ Normal Normal Normal Normal L. Deltoid Normal None None None _______ Normal Normal Normal Normal L. Triceps brachii Normal None None None _______ Normal Normal Normal Normal L. Thoracic  paraspinals Normal None None None _______ Normal Normal Normal Normal L. Cervical paraspinals Normal None None None _______ Normal Normal Normal Normal

## 2023-10-29 ENCOUNTER — Other Ambulatory Visit: Payer: Self-pay | Admitting: Nurse Practitioner

## 2023-10-29 ENCOUNTER — Telehealth: Payer: Self-pay | Admitting: Nurse Practitioner

## 2023-10-29 DIAGNOSIS — Z853 Personal history of malignant neoplasm of breast: Secondary | ICD-10-CM

## 2023-10-29 NOTE — Telephone Encounter (Signed)
 Penny Huynh is scheduled for her follow up appointment and she has been contacted and made aware.

## 2023-10-30 ENCOUNTER — Ambulatory Visit: Payer: Self-pay | Admitting: Internal Medicine

## 2023-11-01 ENCOUNTER — Ambulatory Visit
Admission: RE | Admit: 2023-11-01 | Discharge: 2023-11-01 | Disposition: A | Discharge status: Home / Self Care | Source: Ambulatory Visit | Attending: Nurse Practitioner | Admitting: Nurse Practitioner

## 2023-11-01 DIAGNOSIS — Z1231 Encounter for screening mammogram for malignant neoplasm of breast: Secondary | ICD-10-CM | POA: Diagnosis not present

## 2023-11-02 ENCOUNTER — Ambulatory Visit: Admitting: Physical Therapy

## 2023-11-02 ENCOUNTER — Encounter: Payer: Self-pay | Admitting: Physical Therapy

## 2023-11-02 DIAGNOSIS — M6281 Muscle weakness (generalized): Secondary | ICD-10-CM | POA: Diagnosis not present

## 2023-11-02 DIAGNOSIS — I739 Peripheral vascular disease, unspecified: Secondary | ICD-10-CM | POA: Diagnosis not present

## 2023-11-02 DIAGNOSIS — I442 Atrioventricular block, complete: Secondary | ICD-10-CM | POA: Diagnosis not present

## 2023-11-02 DIAGNOSIS — H8110 Benign paroxysmal vertigo, unspecified ear: Secondary | ICD-10-CM | POA: Diagnosis not present

## 2023-11-02 DIAGNOSIS — R2689 Other abnormalities of gait and mobility: Secondary | ICD-10-CM | POA: Diagnosis not present

## 2023-11-02 DIAGNOSIS — M5459 Other low back pain: Secondary | ICD-10-CM

## 2023-11-02 DIAGNOSIS — T451X5A Adverse effect of antineoplastic and immunosuppressive drugs, initial encounter: Secondary | ICD-10-CM | POA: Diagnosis not present

## 2023-11-02 DIAGNOSIS — G620 Drug-induced polyneuropathy: Secondary | ICD-10-CM | POA: Diagnosis not present

## 2023-11-02 DIAGNOSIS — I723 Aneurysm of iliac artery: Secondary | ICD-10-CM | POA: Diagnosis not present

## 2023-11-02 NOTE — Therapy (Signed)
 OUTPATIENT PHYSICAL THERAPY TREATMENT   Patient Name: Penny Huynh MRN: [id number] DOB:November 02, 1933, 89 y.o., female Today's Date: 11/02/2023  END OF SESSION:  PT End of Session - 11/02/23 1146     Visit Number 2    Number of Visits 8    Date for Recertification  12/23/23    PT Start Time 1138    PT Stop Time 1220    PT Time Calculation (min) 42 min    Activity Tolerance Patient tolerated treatment well    Behavior During Therapy [facility] for tasks assessed/performed           Past Medical History:  Diagnosis Date   Arthritis    maybe a little in my left knee (02/23/2017)   Asthmatic bronchitis    Cancer of left breast (HCC) 2019   Coronary atherosclerosis    Minor nonobstructive CAD at cardiac catheterization 2009   Dyslipidemia    Femoral bruit 7/08   With normal ABIs   Hypertension    Iliac artery aneurysm    Lower extremity edema    Chronic   Pneumonia ~ 1950   in 8th grade   Type II diabetes mellitus (HCC)    Past Surgical History:  Procedure Laterality Date   APPENDECTOMY     BREAST BIOPSY Left 02/2017   CARDIAC CATHETERIZATION     long time ago (02/23/2017)   MASTECTOMY COMPLETE / SIMPLE W/ SENTINEL NODE BIOPSY Left 02/23/2017   TOTAL MATECTOMY;  LEFT AXILLARY SENTINEL LYMPH NODE BIOPSY ERAS PATHWAY   MASTECTOMY WITH RADIOACTIVE SEED GUIDED EXCISION AND AXILLARY SENTINEL LYMPH NODE BIOPSY Left 02/23/2017   Procedure: LEFT TOTAL MATECTOMY;  LEFT AXILLARY SENTINEL LYMPH NODE BIOPSY ERAS PATHWAY;  Surgeon: Mikell Katz, MD;  Location: [facility] OR;  Service: General;  Laterality: Left;   PACEMAKER IMPLANT N/A 10/10/2020   Procedure: PACEMAKER IMPLANT;  Surgeon: Waddell Danelle ORN, MD;  Location: [facility];  Service: Cardiovascular;  Laterality: N/A;   PILONIDAL CYST EXCISION     PORT-A-CATH REMOVAL Right 09/27/2018   Procedure: REMOVAL PORT-A-CATH;  Surgeon: Gail Favorite, MD;  Location: [facility];  Service: General;  Laterality: Right;    PORTA CATH INSERTION  02/23/2017   PORTACATH PLACEMENT N/A 02/23/2017   Procedure: INSERTION PORT-A-CATH;  Surgeon: Mikell Katz, MD;  Location: [facility] OR;  Service: General;  Laterality: N/A;   SHOULDER ARTHROSCOPY W/ ROTATOR CUFF REPAIR Right    Patient Active Problem List   Diagnosis Date Noted   Chronic diastolic heart failure (HCC) 05/10/2023   Iliac artery aneurysm 05/10/2023   Lumbar radiculopathy 11/26/2022   Chronic cough 11/26/2022   Pacemaker 01/14/2021   Complete heart block (HCC) 10/09/2020   Occult blood positive stool 04/17/2020   Melena 12/06/2019   PAD (peripheral artery disease) 03/17/2019   Osteoporosis 11/08/2018   Asthmatic bronchitis 07/13/2018   Hyponatremia 07/13/2018   Tenosynovitis, de Quervain 07/13/2018   Other obesity due to excess calories 03/22/2018   Chemotherapy-induced peripheral neuropathy 09/03/2017   Port-A-Cath in place 04/14/2017   Stage II breast cancer, left (HCC) 02/23/2017   Carcinoma of upper-outer quadrant of left breast in female, estrogen receptor positive (HCC) 02/10/2017   Carcinoma of upper-inner quadrant of left breast in female, estrogen receptor positive (HCC) 02/10/2017   Mixed hyperlipidemia 01/12/2016   Type 2 diabetes mellitus with diabetic neuropathy, unspecified (HCC) 01/12/2016   Chest pain 07/18/2015   Hypokalemia 07/18/2015   Vertigo 07/18/2015   Benign paroxysmal vertigo, unspecified ear 07/17/2015  Allergic rhinitis 10/15/2011   Carpal tunnel syndrome 11/12/2010   Pure hypercholesterolemia 11/10/2010   LBBB (left bundle branch block) 05/28/2009   AODM 12/21/2007   Essential hypertension, benign 12/21/2007   DIASTOLIC DYSFUNCTION 12/21/2007   FEMORAL BRUIT, RIGHT 12/21/2007   DYSPNEA 12/21/2007    PCP: Atilano Deward ORN, MD   REFERRING PROVIDER: Onita Duos, MD   REFERRING DIAG:  G62.0,T45.1X5A (ICD-10-CM) - Chemotherapy-induced peripheral neuropathy  I44.2 (ICD-10-CM) - Complete heart block (HCC)   I72.3 (ICD-10-CM) - Iliac artery aneurysm  I73.9 (ICD-10-CM) - PAD (peripheral artery disease)  H81.10 (ICD-10-CM) - Benign paroxysmal vertigo, unspecified laterality    Rationale for Evaluation and Treatment:  Rehabiliation  THERAPY DIAG:  Other abnormalities of gait and mobility  Muscle weakness (generalized)  Other low back pain  ONSET DATE: Chronic pain, more problems since chemo in 2020   SUBJECTIVE:                                                                                                                                                                                           SUBJECTIVE STATEMENT:today she feels pain is about the same, 6-7/10   Per Eval: She noticed gradual onset bilateral feet and fingertips paresthesia since finishing chemotherapy 2020 during her breast cancer treatment, which has been persistent since then, initially she has no trouble walking, but since 2025, she noticed increased gait abnormality, walker for longer distance, worsening bilateral feet numbness tingling in the fingertips paresthesia, low back pain, lower extremity discomfort. She says surgery has been recommended for her back but she does not want to have this. She uses cane or walker to help with her balance.   PERTINENT HISTORY:  See above PMH, PACEMAKER!!!!  PAIN:  NPRS scale: 7/10 upon arrival Pain location:legs and low back Pain description: constant, can be dull and sharp Aggravating factors: Sitting too long, walking, reaching, cleaning Relieving factors: meds, has tried ice and heat which did not help   PRECAUTIONS: ,  PACEMAKER  RED FLAGS: None   WEIGHT BEARING RESTRICTIONS:  No  FALLS:  Has patient fallen in last 6 months? Yes. Number of falls 1, her knee buckled on her and she fell, she does feel her balance is not good so uses cane or walker  PLOF:  Independent with basic ADLs  PATIENT GOALS:  Reduce pain, help with balance, help with leg  strength  OBJECTIVE:  Note: Objective measures were completed at Evaluation unless otherwise noted.  DIAGNOSTIC FINDINGS:  EMG nerve conduction study October 13, 2023, confirmed chronic right lumbosacral radiculopathy, with superimposed moderate severe axonal sensorimotor polyneuropathy, there is also evidence of moderate right  carpal tunnel syndrome  MRI of the lumbar spine September 01, 2023, moderate lumbar spondylosis, progressed from prior area, most pronounced L4-5, severe canal stenosis, moderately severe bilateral foraminal narrowing,   PATIENT SURVEYS:  Patient-Specific Activity Scoring Scheme  0 represents "unable to perform." 10 represents "able to perform at prior level. 0 1 2 3 4 5 6 7 8 9  10 (Date and Score)   Activity Eval     1. reaching 5    2. laundry  5    3.     4.    5.    Score 5/10    Total score = sum of the activity scores/number of activities Minimum detectable change (90%CI) for average score = 2 points Minimum detectable change (90%CI) for single activity score = 3 points    GAIT: Eval: Assistive device utilized: uses cane and sometimes walker Level of assistance: Modified independence Comments: limited community distances    UPPER EXTREMITY MMT:  MMT Right eval Left eval  Shoulder flexion 4 4  Shoulder extension    Shoulder abduction 4 3+  Shoulder adduction    Shoulder extension    Shoulder internal rotation 5 5  Shoulder external rotation 4 4  Middle trapezius    Lower trapezius    Elbow flexion 4 4  Elbow extension 4 4  Wrist flexion    Wrist extension    Wrist ulnar deviation    Wrist radial deviation    Wrist pronation    Wrist supination    Grip strength     (Blank rows = not tested)   LUMBAR ROM:   Active  A/PROM  eval  Flexion WNL  Extension To netural only  Right lateral flexion 25%  Left lateral flexion 25%  Right rotation 50%  Left rotation 50%   (Blank rows = not tested)   LOWER EXTREMITY STRENGTH:      MMT Right eval Left eval  Hip flexion 4 4  Hip extension    Hip abduction 4 4  Hip adduction    Hip internal rotation    Hip external rotation    Knee flexion 5 5  Knee extension 4 4  Ankle dorsiflexion    Ankle plantarflexion    Ankle inversion    Ankle eversion     (Blank rows = not tested)     FUNCTIONAL TESTS:  10/28/23 TUG time, timed up and go: 22.7 seconds using SPC                                                                                                                                     TREATMENT DATE:  11/01/33 Therex Nu step L4 X 8 min UE/LE Seated lumbar flexion stretch with pball roll outs 5 sec X 10  Theractivity Seated ab set with pball isometric shoulder ext 5 sec X 10 and isometric hip flexion 5 sec X 5 Seated rows red X  10 Seated chest press red X 10 Seated SLR X 10 bilat Sit to stands no UE support X 8 reps, supervision needed for balance  Neuromuscular re-ed Modified tandem balance 30 sec X 4 total, 2 bilat each way Balance with feet closer than shoulder width apart with head turns X 10 Balance with feet closer than shoulder width apart with head nods X 10 Balance with feet closer than shoulder width apart eyes closed 15 sec X 3 Blaze pods SLS with contralateral foot taps, 3 pods 30 sec X 4 total, 2 each side       PATIENT EDUCATION: Education details: HEP, PT plan of care, selfcare Person educated: Patient Education method: Explanation, Demonstration, Verbal cues, and Handouts Education comprehension: verbalized understanding, further education recommended   HOME EXERCISE PROGRAM: Access Code: [email or web address] URL: [email or web address] Date: 10/28/2023 Prepared by: Redell Moose  Exercises - Seated Bicep Curls Supinated with Dumbbells  - 2 x daily - 6 x weekly - 2 sets - 10 reps - Seated Shoulder Flexion with Dumbbells  - 2 x daily - 6 x weekly - 2 sets - 10 reps - Seated Shoulder Abduction with Dumbbells -  Thumbs Up  - 2 x daily - 6 x weekly - 2 sets - 10 reps - Seated Active Straight-Leg Raise  - 2 x daily - 6 x weekly - 2 sets - 10 reps - Sit to Stand  - 2 x daily - 6 x weekly - 1 sets - 10 reps - Semi-Tandem Balance at [employer] Eyes Open  - 2 x daily - 6 x weekly - 1 sets - 3 reps - 15 sec hold - Corner Balance Feet Together: Eyes Open With Head Turns  - 2 x daily - 6 x weekly - 1 sets - 10 reps - Wide Stance with Head Nods and Counter Support  - 2 x daily - 6 x weekly - 1 sets - 10 reps  ASSESSMENT:  CLINICAL IMPRESSION: Session focused on HEP review with education, demonstration, and cuing provided. I added some additional strength work/endurance work in session today as tolerated. We will monitor for any soreness and adjust as needed based on her response/tolerance. She will continue with HEP and follow up with PT in 2 weeks.    Per eval: CAIYA BETTES is a 89 y.o. female  with gait abnormality, poor balance, general weakness and chronic back pain with severe spinal stenosis. EMG nerve conduction study October 13, 2023, confirmed chronic right lumbosacral radiculopathy, with superimposed moderate severe axonal sensorimotor polyneuropathy, there is also evidence of moderate right carpal tunnel syndrome.. Patient will benefit from skilled PT to improve overall function and to address impairments and limitations listed below.  OBJECTIVE IMPAIRMENTS: decreased activity tolerance for ADL's, difficulty walking, decreased balance, decreased endurance, decreased mobility, decreased ROM, decreased strength, impaired flexibility, impaired UE/LE use, and pain.  ACTIVITY LIMITATIONS: bending, liftting, walking, standing, cleaning, community activity,  reaching, carry  PERSONAL FACTORS: see above PMH  also affecting patient's functional outcome.  REHAB POTENTIAL: Good  CLINICAL DECISION MAKING: Evolving/moderate complexity  EVALUATION COMPLEXITY: Moderate    GOALS: Short term PT Goals  Target date: 11/25/2023   Pt will be I and compliant with HEP. Baseline:  Goal status: New Pt will decrease pain by 25% overall Baseline: Goal status: New  Long term PT goals Target date:12/23/2023   Pt will improve TUG time to less than 16 seconds to show improved gait speed and balance Baseline:26 Goal status: New Pt will improve  strength to at least 4+/5 MMT to improve functional strength Baseline: Goal status: New Pt will improve Patient specific functional scale (PSFS) to at least 7/10 to show improved function level Baseline:5/10 Goal status: New Pt will reduce pain to overall less than 3/10 with usual activity and work activity. Baseline: Goal status: New Pt will improve BERG balance test to at least 40 points to show improved balance.  Baseline:33 Goal status: New  PLAN: PT FREQUENCY: 1-2 times per week   PT DURATION: 6-8 weeks  PLANNED INTERVENTIONS  97110-Therapeutic exercises, 97530- Therapeutic activity, 97112- Neuromuscular re-education, 97535- Self Care, [id number]- Manual therapy, and Patient/Family education  PLAN FOR NEXT SESSION: PACEMAKER so no electric modalaties, , work on gait, balance, strength/endurance  Redell JONELLE Moose, PT,DPT 11/02/2023, 12:22 PM

## 2023-11-05 ENCOUNTER — Encounter: Payer: Self-pay | Admitting: Hematology

## 2023-11-07 ENCOUNTER — Encounter: Payer: Self-pay | Admitting: Nurse Practitioner

## 2023-11-07 ENCOUNTER — Encounter: Payer: Self-pay | Admitting: Hematology

## 2023-11-10 ENCOUNTER — Encounter: Admitting: Physical Therapy

## 2023-11-12 ENCOUNTER — Ambulatory Visit: Attending: Family Medicine | Admitting: Physical Therapy

## 2023-11-12 ENCOUNTER — Encounter: Payer: Self-pay | Admitting: Physical Therapy

## 2023-11-12 DIAGNOSIS — M5459 Other low back pain: Secondary | ICD-10-CM | POA: Insufficient documentation

## 2023-11-12 DIAGNOSIS — R2689 Other abnormalities of gait and mobility: Secondary | ICD-10-CM | POA: Insufficient documentation

## 2023-11-12 DIAGNOSIS — M6281 Muscle weakness (generalized): Secondary | ICD-10-CM | POA: Diagnosis not present

## 2023-11-12 NOTE — Therapy (Signed)
 OUTPATIENT PHYSICAL THERAPY TREATMENT   Patient Name: Penny Huynh MRN: [id number] DOB:1933-02-04, 89 y.o., female Today's Date: 11/12/2023  END OF SESSION:  PT End of Session - 11/12/23 0939     Visit Number 3    Number of Visits 8    Date for Recertification  12/23/23    PT Start Time 0930    PT Stop Time 1008    PT Time Calculation (min) 38 min    Activity Tolerance Patient tolerated treatment well    Behavior During Therapy [facility] for tasks assessed/performed           Past Medical History:  Diagnosis Date   Arthritis    maybe a little in my left knee (02/23/2017)   Asthmatic bronchitis    Cancer of left breast (HCC) 2019   Coronary atherosclerosis    Minor nonobstructive CAD at cardiac catheterization 2009   Dyslipidemia    Femoral bruit 7/08   With normal ABIs   Hypertension    Iliac artery aneurysm    Lower extremity edema    Chronic   Pneumonia ~ 1950   in 8th grade   Type II diabetes mellitus (HCC)    Past Surgical History:  Procedure Laterality Date   APPENDECTOMY     BREAST BIOPSY Left 02/2017   CARDIAC CATHETERIZATION     long time ago (02/23/2017)   MASTECTOMY COMPLETE / SIMPLE W/ SENTINEL NODE BIOPSY Left 02/23/2017   TOTAL MATECTOMY;  LEFT AXILLARY SENTINEL LYMPH NODE BIOPSY ERAS PATHWAY   MASTECTOMY WITH RADIOACTIVE SEED GUIDED EXCISION AND AXILLARY SENTINEL LYMPH NODE BIOPSY Left 02/23/2017   Procedure: LEFT TOTAL MATECTOMY;  LEFT AXILLARY SENTINEL LYMPH NODE BIOPSY ERAS PATHWAY;  Surgeon: Mikell Katz, MD;  Location: [facility] OR;  Service: General;  Laterality: Left;   PACEMAKER IMPLANT N/A 10/10/2020   Procedure: PACEMAKER IMPLANT;  Surgeon: Waddell Danelle ORN, MD;  Location: [facility];  Service: Cardiovascular;  Laterality: N/A;   PILONIDAL CYST EXCISION     PORT-A-CATH REMOVAL Right 09/27/2018   Procedure: REMOVAL PORT-A-CATH;  Surgeon: Gail Favorite, MD;  Location: [facility];  Service: General;  Laterality: Right;    PORTA CATH INSERTION  02/23/2017   PORTACATH PLACEMENT N/A 02/23/2017   Procedure: INSERTION PORT-A-CATH;  Surgeon: Mikell Katz, MD;  Location: [facility] OR;  Service: General;  Laterality: N/A;   SHOULDER ARTHROSCOPY W/ ROTATOR CUFF REPAIR Right    Patient Active Problem List   Diagnosis Date Noted   Chronic diastolic heart failure (HCC) 05/10/2023   Iliac artery aneurysm 05/10/2023   Lumbar radiculopathy 11/26/2022   Chronic cough 11/26/2022   Pacemaker 01/14/2021   Complete heart block (HCC) 10/09/2020   Occult blood positive stool 04/17/2020   Melena 12/06/2019   PAD (peripheral artery disease) 03/17/2019   Osteoporosis 11/08/2018   Asthmatic bronchitis 07/13/2018   Hyponatremia 07/13/2018   Tenosynovitis, de Quervain 07/13/2018   Other obesity due to excess calories 03/22/2018   Chemotherapy-induced peripheral neuropathy 09/03/2017   Port-A-Cath in place 04/14/2017   Stage II breast cancer, left (HCC) 02/23/2017   Carcinoma of upper-outer quadrant of left breast in female, estrogen receptor positive (HCC) 02/10/2017   Carcinoma of upper-inner quadrant of left breast in female, estrogen receptor positive (HCC) 02/10/2017   Mixed hyperlipidemia 01/12/2016   Type 2 diabetes mellitus with diabetic neuropathy, unspecified (HCC) 01/12/2016   Chest pain 07/18/2015   Hypokalemia 07/18/2015   Vertigo 07/18/2015   Benign paroxysmal vertigo, unspecified ear 07/17/2015  Allergic rhinitis 10/15/2011   Carpal tunnel syndrome 11/12/2010   Pure hypercholesterolemia 11/10/2010   LBBB (left bundle branch block) 05/28/2009   AODM 12/21/2007   Essential hypertension, benign 12/21/2007   DIASTOLIC DYSFUNCTION 12/21/2007   FEMORAL BRUIT, RIGHT 12/21/2007   DYSPNEA 12/21/2007    PCP: Atilano Deward ORN, MD   REFERRING PROVIDER: Onita Duos, MD   REFERRING DIAG:  G62.0,T45.1X5A (ICD-10-CM) - Chemotherapy-induced peripheral neuropathy  I44.2 (ICD-10-CM) - Complete heart block (HCC)   I72.3 (ICD-10-CM) - Iliac artery aneurysm  I73.9 (ICD-10-CM) - PAD (peripheral artery disease)  H81.10 (ICD-10-CM) - Benign paroxysmal vertigo, unspecified laterality    Rationale for Evaluation and Treatment:  Rehabiliation  THERAPY DIAG:  Other abnormalities of gait and mobility  Muscle weakness (generalized)  Other low back pain  ONSET DATE: Chronic pain, more problems since chemo in 2020   SUBJECTIVE:                                                                                                                                                                                           SUBJECTIVE STATEMENT:She says the pain is bad today, She says she is moving to [place] at end of month so will look to transfer PT care to another clinic there.    Per Eval: She noticed gradual onset bilateral feet and fingertips paresthesia since finishing chemotherapy 2020 during her breast cancer treatment, which has been persistent since then, initially she has no trouble walking, but since 2025, she noticed increased gait abnormality, walker for longer distance, worsening bilateral feet numbness tingling in the fingertips paresthesia, low back pain, lower extremity discomfort. She says surgery has been recommended for her back but she does not want to have this. She uses cane or walker to help with her balance.   PERTINENT HISTORY:  See above PMH, PACEMAKER!!!!  PAIN:  NPRS scale: 9/10 upon arrival Pain location:legs and low back Pain description: constant, can be dull and sharp Aggravating factors: Sitting too long, walking, reaching, cleaning Relieving factors: meds, has tried ice and heat which did not help   PRECAUTIONS: ,  PACEMAKER  RED FLAGS: None   WEIGHT BEARING RESTRICTIONS:  No  FALLS:  Has patient fallen in last 6 months? Yes. Number of falls 1, her knee buckled on her and she fell, she does feel her balance is not good so uses cane or walker  PLOF:  Independent  with basic ADLs  PATIENT GOALS:  Reduce pain, help with balance, help with leg strength  OBJECTIVE:  Note: Objective measures were completed at Evaluation unless otherwise noted.  DIAGNOSTIC FINDINGS:  EMG nerve conduction study October 13, 2023, confirmed chronic right lumbosacral radiculopathy, with superimposed moderate severe axonal sensorimotor polyneuropathy, there is also evidence of moderate right carpal tunnel syndrome  MRI of the lumbar spine September 01, 2023, moderate lumbar spondylosis, progressed from prior area, most pronounced L4-5, severe canal stenosis, moderately severe bilateral foraminal narrowing,   PATIENT SURVEYS:  Patient-Specific Activity Scoring Scheme  0 represents "unable to perform." 10 represents "able to perform at prior level. 0 1 2 3 4 5 6 7 8 9  10 (Date and Score)   Activity Eval     1. reaching 5    2. laundry  5    3.     4.    5.    Score 5/10    Total score = sum of the activity scores/number of activities Minimum detectable change (90%CI) for average score = 2 points Minimum detectable change (90%CI) for single activity score = 3 points    GAIT: Eval: Assistive device utilized: uses cane and sometimes walker Level of assistance: Modified independence Comments: limited community distances    UPPER EXTREMITY MMT:  MMT Right eval Left eval  Shoulder flexion 4 4  Shoulder extension    Shoulder abduction 4 3+  Shoulder adduction    Shoulder extension    Shoulder internal rotation 5 5  Shoulder external rotation 4 4  Middle trapezius    Lower trapezius    Elbow flexion 4 4  Elbow extension 4 4  Wrist flexion    Wrist extension    Wrist ulnar deviation    Wrist radial deviation    Wrist pronation    Wrist supination    Grip strength     (Blank rows = not tested)   LUMBAR ROM:   Active  A/PROM  eval  Flexion WNL  Extension To netural only  Right lateral flexion 25%  Left lateral flexion 25%  Right rotation  50%  Left rotation 50%   (Blank rows = not tested)   LOWER EXTREMITY STRENGTH:     MMT Right eval Left eval  Hip flexion 4 4  Hip extension    Hip abduction 4 4  Hip adduction    Hip internal rotation    Hip external rotation    Knee flexion 5 5  Knee extension 4 4  Ankle dorsiflexion    Ankle plantarflexion    Ankle inversion    Ankle eversion     (Blank rows = not tested)     FUNCTIONAL TESTS:  10/28/23 TUG time, timed up and go: 22.7 seconds using SPC                                                                                                                                     TREATMENT DATE:  11/12/23 Therex Nu step L4 X 8 min UE/LE with moist heat to low back Seated lumbar flexion stretch with pball roll outs 5 sec X  10  Theractivity Seated ab set with pball isometric shoulder ext 5 sec X 10 and isometric hip flexion 5 sec X 5 Seated rows red X 15 Seated chest press red X 15 Seated SLR X 10 bilat  Neuromuscular re-ed Modified tandem balance 30 sec X 4 total, 2 bilat each way Balance with feet closer together with head turns X 10  Manual therapy Soft tissue mobilizaiton with biofreeze to low back   11/01/33 Therex Nu step L4 X 8 min UE/LE Seated lumbar flexion stretch with pball roll outs 5 sec X 10  Theractivity Seated ab set with pball isometric shoulder ext 5 sec X 10 and isometric hip flexion 5 sec X 5 Seated rows red X 10 Seated chest press red X 10 Seated SLR X 10 bilat Sit to stands no UE support X 8 reps, supervision needed for balance  Neuromuscular re-ed Modified tandem balance 30 sec X 4 total, 2 bilat each way Balance with feet closer than shoulder width apart with head turns X 10 Balance with feet closer than shoulder width apart with head nods X 10 Balance with feet closer than shoulder width apart eyes closed 15 sec X 3 Blaze pods SLS with contralateral foot taps, 3 pods 30 sec X 4 total, 2 each side       PATIENT  EDUCATION: Education details: HEP, PT plan of care, selfcare Person educated: Patient Education method: Explanation, Demonstration, Verbal cues, and Handouts Education comprehension: verbalized understanding, further education recommended   HOME EXERCISE PROGRAM: Access Code: [email or web address] URL: [email or web address] Date: 10/28/2023 Prepared by: Redell Moose  Exercises - Seated Bicep Curls Supinated with Dumbbells  - 2 x daily - 6 x weekly - 2 sets - 10 reps - Seated Shoulder Flexion with Dumbbells  - 2 x daily - 6 x weekly - 2 sets - 10 reps - Seated Shoulder Abduction with Dumbbells - Thumbs Up  - 2 x daily - 6 x weekly - 2 sets - 10 reps - Seated Active Straight-Leg Raise  - 2 x daily - 6 x weekly - 2 sets - 10 reps - Sit to Stand  - 2 x daily - 6 x weekly - 1 sets - 10 reps - Semi-Tandem Balance at [employer] Eyes Open  - 2 x daily - 6 x weekly - 1 sets - 3 reps - 15 sec hold - Corner Balance Feet Together: Eyes Open With Head Turns  - 2 x daily - 6 x weekly - 1 sets - 10 reps - Wide Stance with Head Nods and Counter Support  - 2 x daily - 6 x weekly - 1 sets - 10 reps  ASSESSMENT:  CLINICAL IMPRESSION: She is still having a lot of pain but able to complete her exercises today. I did try to soft tissue work today with biofreeze to see if this will help reduce pain any.    Per eval: Penny Huynh is a 89 y.o. female  with gait abnormality, poor balance, general weakness and chronic back pain with severe spinal stenosis. EMG nerve conduction study October 13, 2023, confirmed chronic right lumbosacral radiculopathy, with superimposed moderate severe axonal sensorimotor polyneuropathy, there is also evidence of moderate right carpal tunnel syndrome.. Patient will benefit from skilled PT to improve overall function and to address impairments and limitations listed below.  OBJECTIVE IMPAIRMENTS: decreased activity tolerance for ADL's, difficulty walking, decreased  balance, decreased endurance, decreased mobility, decreased ROM, decreased strength, impaired flexibility, impaired UE/LE use, and pain.  ACTIVITY LIMITATIONS: bending, liftting, walking, standing, cleaning, community activity,  reaching, carry  PERSONAL FACTORS: see above PMH  also affecting patient's functional outcome.  REHAB POTENTIAL: Good  CLINICAL DECISION MAKING: Evolving/moderate complexity  EVALUATION COMPLEXITY: Moderate    GOALS: Short term PT Goals Target date: 11/25/2023   Pt will be I and compliant with HEP. Baseline:  Goal status: New Pt will decrease pain by 25% overall Baseline: Goal status: New  Long term PT goals Target date:12/23/2023   Pt will improve TUG time to less than 16 seconds to show improved gait speed and balance Baseline:26 Goal status: New Pt will improve  strength to at least 4+/5 MMT to improve functional strength Baseline: Goal status: New Pt will improve Patient specific functional scale (PSFS) to at least 7/10 to show improved function level Baseline:5/10 Goal status: New Pt will reduce pain to overall less than 3/10 with usual activity and work activity. Baseline: Goal status: New Pt will improve BERG balance test to at least 40 points to show improved balance.  Baseline:33 Goal status: New  PLAN: PT FREQUENCY: 1-2 times per week   PT DURATION: 6-8 weeks  PLANNED INTERVENTIONS  97110-Therapeutic exercises, 97530- Therapeutic activity, 97112- Neuromuscular re-education, 97535- Self Care, [id number]- Manual therapy, and Patient/Family education  PLAN FOR NEXT SESSION: PACEMAKER so no electric modalaties, , work on gait, balance, strength/endurance, manual thearpy, how did she like biofreeze last time?  Redell JONELLE Moose, PT,DPT 11/12/2023, 10:09 AM

## 2023-11-15 DIAGNOSIS — I10 Essential (primary) hypertension: Secondary | ICD-10-CM | POA: Diagnosis not present

## 2023-11-15 DIAGNOSIS — R42 Dizziness and giddiness: Secondary | ICD-10-CM | POA: Diagnosis not present

## 2023-11-15 DIAGNOSIS — E1140 Type 2 diabetes mellitus with diabetic neuropathy, unspecified: Secondary | ICD-10-CM | POA: Diagnosis not present

## 2023-11-15 DIAGNOSIS — Z0001 Encounter for general adult medical examination with abnormal findings: Secondary | ICD-10-CM | POA: Diagnosis not present

## 2023-11-15 DIAGNOSIS — E7849 Other hyperlipidemia: Secondary | ICD-10-CM | POA: Diagnosis not present

## 2023-11-15 DIAGNOSIS — E1165 Type 2 diabetes mellitus with hyperglycemia: Secondary | ICD-10-CM | POA: Diagnosis not present

## 2023-11-15 DIAGNOSIS — E876 Hypokalemia: Secondary | ICD-10-CM | POA: Diagnosis not present

## 2023-11-16 ENCOUNTER — Ambulatory Visit: Admitting: Physical Therapy

## 2023-11-16 ENCOUNTER — Encounter: Payer: Self-pay | Admitting: Physical Therapy

## 2023-11-16 DIAGNOSIS — M5459 Other low back pain: Secondary | ICD-10-CM

## 2023-11-16 DIAGNOSIS — M6281 Muscle weakness (generalized): Secondary | ICD-10-CM | POA: Diagnosis not present

## 2023-11-16 DIAGNOSIS — R2689 Other abnormalities of gait and mobility: Secondary | ICD-10-CM | POA: Diagnosis not present

## 2023-11-16 NOTE — Therapy (Signed)
 OUTPATIENT PHYSICAL THERAPY TREATMENT   Patient Name: Penny Huynh MRN: [id number] DOB:Apr 02, 1933, 89 y.o., female Today's Date: 11/16/2023  END OF SESSION:  PT End of Session - 11/16/23 0757     Visit Number 4    Number of Visits 8    Date for Recertification  12/23/23    PT Start Time 0800    PT Stop Time 0838    PT Time Calculation (min) 38 min    Activity Tolerance Patient tolerated treatment well    Behavior During Therapy [facility] for tasks assessed/performed           Past Medical History:  Diagnosis Date   Arthritis    maybe a little in my left knee (02/23/2017)   Asthmatic bronchitis    Cancer of left breast (HCC) 2019   Coronary atherosclerosis    Minor nonobstructive CAD at cardiac catheterization 2009   Dyslipidemia    Femoral bruit 7/08   With normal ABIs   Hypertension    Iliac artery aneurysm    Lower extremity edema    Chronic   Pneumonia ~ 1950   in 8th grade   Type II diabetes mellitus (HCC)    Past Surgical History:  Procedure Laterality Date   APPENDECTOMY     BREAST BIOPSY Left 02/2017   CARDIAC CATHETERIZATION     long time ago (02/23/2017)   MASTECTOMY COMPLETE / SIMPLE W/ SENTINEL NODE BIOPSY Left 02/23/2017   TOTAL MATECTOMY;  LEFT AXILLARY SENTINEL LYMPH NODE BIOPSY ERAS PATHWAY   MASTECTOMY WITH RADIOACTIVE SEED GUIDED EXCISION AND AXILLARY SENTINEL LYMPH NODE BIOPSY Left 02/23/2017   Procedure: LEFT TOTAL MATECTOMY;  LEFT AXILLARY SENTINEL LYMPH NODE BIOPSY ERAS PATHWAY;  Surgeon: Mikell Katz, MD;  Location: [facility] OR;  Service: General;  Laterality: Left;   PACEMAKER IMPLANT N/A 10/10/2020   Procedure: PACEMAKER IMPLANT;  Surgeon: Waddell Danelle ORN, MD;  Location: [facility];  Service: Cardiovascular;  Laterality: N/A;   PILONIDAL CYST EXCISION     PORT-A-CATH REMOVAL Right 09/27/2018   Procedure: REMOVAL PORT-A-CATH;  Surgeon: Gail Favorite, MD;  Location: [facility];  Service: General;  Laterality: Right;    PORTA CATH INSERTION  02/23/2017   PORTACATH PLACEMENT N/A 02/23/2017   Procedure: INSERTION PORT-A-CATH;  Surgeon: Mikell Katz, MD;  Location: [facility] OR;  Service: General;  Laterality: N/A;   SHOULDER ARTHROSCOPY W/ ROTATOR CUFF REPAIR Right    Patient Active Problem List   Diagnosis Date Noted   Chronic diastolic heart failure (HCC) 05/10/2023   Iliac artery aneurysm 05/10/2023   Lumbar radiculopathy 11/26/2022   Chronic cough 11/26/2022   Pacemaker 01/14/2021   Complete heart block (HCC) 10/09/2020   Occult blood positive stool 04/17/2020   Melena 12/06/2019   PAD (peripheral artery disease) 03/17/2019   Osteoporosis 11/08/2018   Asthmatic bronchitis 07/13/2018   Hyponatremia 07/13/2018   Tenosynovitis, de Quervain 07/13/2018   Other obesity due to excess calories 03/22/2018   Chemotherapy-induced peripheral neuropathy 09/03/2017   Port-A-Cath in place 04/14/2017   Stage II breast cancer, left (HCC) 02/23/2017   Carcinoma of upper-outer quadrant of left breast in female, estrogen receptor positive (HCC) 02/10/2017   Carcinoma of upper-inner quadrant of left breast in female, estrogen receptor positive (HCC) 02/10/2017   Mixed hyperlipidemia 01/12/2016   Type 2 diabetes mellitus with diabetic neuropathy, unspecified (HCC) 01/12/2016   Chest pain 07/18/2015   Hypokalemia 07/18/2015   Vertigo 07/18/2015   Benign paroxysmal vertigo, unspecified ear 07/17/2015  Allergic rhinitis 10/15/2011   Carpal tunnel syndrome 11/12/2010   Pure hypercholesterolemia 11/10/2010   LBBB (left bundle branch block) 05/28/2009   AODM 12/21/2007   Essential hypertension, benign 12/21/2007   DIASTOLIC DYSFUNCTION 12/21/2007   FEMORAL BRUIT, RIGHT 12/21/2007   DYSPNEA 12/21/2007    PCP: Atilano Deward ORN, MD   REFERRING PROVIDER: Onita Duos, MD   REFERRING DIAG:  G62.0,T45.1X5A (ICD-10-CM) - Chemotherapy-induced peripheral neuropathy  I44.2 (ICD-10-CM) - Complete heart block (HCC)   I72.3 (ICD-10-CM) - Iliac artery aneurysm  I73.9 (ICD-10-CM) - PAD (peripheral artery disease)  H81.10 (ICD-10-CM) - Benign paroxysmal vertigo, unspecified laterality    Rationale for Evaluation and Treatment:  Rehabiliation  THERAPY DIAG:  Other abnormalities of gait and mobility  Muscle weakness (generalized)  Other low back pain  ONSET DATE: Chronic pain, more problems since chemo in 2020   SUBJECTIVE:                                                                                                                                                                                           SUBJECTIVE STATEMENT:She says the pain is a little better, felt the manual therapy with biofreeze helped some.    Per Eval: She noticed gradual onset bilateral feet and fingertips paresthesia since finishing chemotherapy 2020 during her breast cancer treatment, which has been persistent since then, initially she has no trouble walking, but since 2025, she noticed increased gait abnormality, walker for longer distance, worsening bilateral feet numbness tingling in the fingertips paresthesia, low back pain, lower extremity discomfort. She says surgery has been recommended for her back but she does not want to have this. She uses cane or walker to help with her balance.   PERTINENT HISTORY:  See above PMH, PACEMAKER!!!!  PAIN:  NPRS scale: 7/10 upon arrival Pain location:legs and low back Pain description: constant, can be dull and sharp Aggravating factors: Sitting too long, walking, reaching, cleaning Relieving factors: meds, has tried ice and heat which did not help   PRECAUTIONS: ,  PACEMAKER  RED FLAGS: None   WEIGHT BEARING RESTRICTIONS:  No  FALLS:  Has patient fallen in last 6 months? Yes. Number of falls 1, her knee buckled on her and she fell, she does feel her balance is not good so uses cane or walker  PLOF:  Independent with basic ADLs  PATIENT GOALS:  Reduce pain, help  with balance, help with leg strength  OBJECTIVE:  Note: Objective measures were completed at Evaluation unless otherwise noted.  DIAGNOSTIC FINDINGS:  EMG nerve conduction study October 13, 2023, confirmed chronic right lumbosacral radiculopathy, with superimposed moderate severe axonal sensorimotor  polyneuropathy, there is also evidence of moderate right carpal tunnel syndrome  MRI of the lumbar spine September 01, 2023, moderate lumbar spondylosis, progressed from prior area, most pronounced L4-5, severe canal stenosis, moderately severe bilateral foraminal narrowing,   PATIENT SURVEYS:  Patient-Specific Activity Scoring Scheme  0 represents "unable to perform." 10 represents "able to perform at prior level. 0 1 2 3 4 5 6 7 8 9  10 (Date and Score)   Activity Eval     1. reaching 5    2. laundry  5    3.     4.    5.    Score 5/10    Total score = sum of the activity scores/number of activities Minimum detectable change (90%CI) for average score = 2 points Minimum detectable change (90%CI) for single activity score = 3 points    GAIT: Eval: Assistive device utilized: uses cane and sometimes walker Level of assistance: Modified independence Comments: limited community distances    UPPER EXTREMITY MMT:  MMT Right eval Left eval  Shoulder flexion 4 4  Shoulder extension    Shoulder abduction 4 3+  Shoulder adduction    Shoulder extension    Shoulder internal rotation 5 5  Shoulder external rotation 4 4  Middle trapezius    Lower trapezius    Elbow flexion 4 4  Elbow extension 4 4  Wrist flexion    Wrist extension    Wrist ulnar deviation    Wrist radial deviation    Wrist pronation    Wrist supination    Grip strength     (Blank rows = not tested)   LUMBAR ROM:   Active  A/PROM  eval  Flexion WNL  Extension To netural only  Right lateral flexion 25%  Left lateral flexion 25%  Right rotation 50%  Left rotation 50%   (Blank rows = not tested)    LOWER EXTREMITY STRENGTH:     MMT Right eval Left eval  Hip flexion 4 4  Hip extension    Hip abduction 4 4  Hip adduction    Hip internal rotation    Hip external rotation    Knee flexion 5 5  Knee extension 4 4  Ankle dorsiflexion    Ankle plantarflexion    Ankle inversion    Ankle eversion     (Blank rows = not tested)     FUNCTIONAL TESTS:  10/28/23 TUG time, timed up and go: 22.7 seconds using SPC                                                                                                                                     TREATMENT DATE:  11/16/23 Therex Nu step L4 X 8 min UE/LE with moist heat to low back Seated lumbar flexion stretch with pball roll outs 5 sec X 10  Theractivity Seated ab set with pball isometric shoulder ext 5 sec  X 10 and isometric hip flexion 5 sec X 10 Seated rows red X 15 Seated chest press red X 15 Seated SLR 2  X 10 bilat  Neuromuscular re-ed Modified tandem balance 30 sec X 4 total, 2 bilat each way Balance with feet closer together with head turns X 10 March walking 3 round trips in bars  Manual therapy Soft tissue mobilizaiton with biofreeze to low back  11/12/23 Therex Nu step L4 X 8 min UE/LE with moist heat to low back Seated lumbar flexion stretch with pball roll outs 5 sec X 10  Theractivity Seated ab set with pball isometric shoulder ext 5 sec X 10 and isometric hip flexion 5 sec X 5 Seated rows red X 15 Seated chest press red X 15 Seated SLR X 10 bilat  Neuromuscular re-ed Modified tandem balance 30 sec X 4 total, 2 bilat each way Balance with feet closer together with head turns X 10   Manual therapy Soft tissue mobilizaiton with biofreeze to low back   PATIENT EDUCATION: Education details: HEP, PT plan of care, selfcare Person educated: Patient Education method: Explanation, Demonstration, Verbal cues, and Handouts Education comprehension: verbalized understanding, further education  recommended   HOME EXERCISE PROGRAM: Access Code: [email or web address] URL: [email or web address] Date: 10/28/2023 Prepared by: Redell Moose  Exercises - Seated Bicep Curls Supinated with Dumbbells  - 2 x daily - 6 x weekly - 2 sets - 10 reps - Seated Shoulder Flexion with Dumbbells  - 2 x daily - 6 x weekly - 2 sets - 10 reps - Seated Shoulder Abduction with Dumbbells - Thumbs Up  - 2 x daily - 6 x weekly - 2 sets - 10 reps - Seated Active Straight-Leg Raise  - 2 x daily - 6 x weekly - 2 sets - 10 reps - Sit to Stand  - 2 x daily - 6 x weekly - 1 sets - 10 reps - Semi-Tandem Balance at [employer] Eyes Open  - 2 x daily - 6 x weekly - 1 sets - 3 reps - 15 sec hold - Corner Balance Feet Together: Eyes Open With Head Turns  - 2 x daily - 6 x weekly - 1 sets - 10 reps - Wide Stance with Head Nods and Counter Support  - 2 x daily - 6 x weekly - 1 sets - 10 reps  ASSESSMENT:  CLINICAL IMPRESSION: Pain was a little better so continued with  soft tissue work today with biofreeze. Continued to work on mobility, balance, and strength as tolerated. She has one more visit scheduled and then she is moving to [employer]. PT plans to discharge here and she will look to get set up at a clinic in [place]. She is interested in aquatic PT so I provided her with the contact information for the [facility] that does aquatic PT and instructed her to reach out to her doctor for aquatic PT referral if she wants to transfer there.    Per eval: Penny Huynh is a 89 y.o. female  with gait abnormality, poor balance, general weakness and chronic back pain with severe spinal stenosis. EMG nerve conduction study October 13, 2023, confirmed chronic right lumbosacral radiculopathy, with superimposed moderate severe axonal sensorimotor polyneuropathy, there is also evidence of moderate right carpal tunnel syndrome.. Patient will benefit from skilled PT to improve overall function and  to address impairments and limitations listed below.  OBJECTIVE IMPAIRMENTS: decreased activity tolerance for ADL's, difficulty walking, decreased balance, decreased  endurance, decreased mobility, decreased ROM, decreased strength, impaired flexibility, impaired UE/LE use, and pain.  ACTIVITY LIMITATIONS: bending, liftting, walking, standing, cleaning, community activity,  reaching, carry  PERSONAL FACTORS: see above PMH  also affecting patient's functional outcome.  REHAB POTENTIAL: Good  CLINICAL DECISION MAKING: Evolving/moderate complexity  EVALUATION COMPLEXITY: Moderate    GOALS: Short term PT Goals Target date: 11/25/2023   Pt will be I and compliant with HEP. Baseline:  Goal status: MET 11/16/23 Pt will decrease pain by 25% overall Baseline: Goal status: ongoing 11/16/23  Long term PT goals Target date:12/23/2023   Pt will improve TUG time to less than 16 seconds to show improved gait speed and balance Baseline:26 Goal status: ongoing 11/16/23 Pt will improve  strength to at least 4+/5 MMT to improve functional strength Baseline: Goal status: ongoing 11/16/23 Pt will improve Patient specific functional scale (PSFS) to at least 7/10 to show improved function level Baseline:5/10 Goal status: ongoing 11/16/23 Pt will reduce pain to overall less than 3/10 with usual activity and work activity. Baseline: Goal status: ongoing 11/16/23 Pt will improve BERG balance test to at least 40 points to show improved balance.  Baseline:33 Goal status: ongoing 11/16/23  PLAN: PT FREQUENCY: 1-2 times per week   PT DURATION: 6-8 weeks  PLANNED INTERVENTIONS  97110-Therapeutic exercises, 97530- Therapeutic activity, 97112- Neuromuscular re-education, 97535- Self Care, [id number]- Manual therapy, and Patient/Family education  PLAN FOR NEXT SESSION: PACEMAKER so no electric modalaties, DC next visit as she is moving.   Redell JONELLE Moose, PT,DPT 11/16/2023, 8:43 AM

## 2023-11-17 ENCOUNTER — Encounter: Admitting: Physical Therapy

## 2023-11-22 ENCOUNTER — Ambulatory Visit: Admitting: Neurology

## 2023-11-22 DIAGNOSIS — E1140 Type 2 diabetes mellitus with diabetic neuropathy, unspecified: Secondary | ICD-10-CM | POA: Diagnosis not present

## 2023-11-22 DIAGNOSIS — E1165 Type 2 diabetes mellitus with hyperglycemia: Secondary | ICD-10-CM | POA: Diagnosis not present

## 2023-11-22 DIAGNOSIS — Z6831 Body mass index (BMI) 31.0-31.9, adult: Secondary | ICD-10-CM | POA: Diagnosis not present

## 2023-11-24 ENCOUNTER — Ambulatory Visit: Admitting: Physical Therapy

## 2023-12-01 ENCOUNTER — Encounter: Payer: Self-pay | Admitting: Physician Assistant

## 2023-12-01 ENCOUNTER — Ambulatory Visit (HOSPITAL_COMMUNITY)
Admission: RE | Admit: 2023-12-01 | Discharge: 2023-12-01 | Disposition: A | Discharge status: Home / Self Care | Source: Ambulatory Visit | Attending: Vascular Surgery | Admitting: Vascular Surgery

## 2023-12-01 ENCOUNTER — Other Ambulatory Visit (HOSPITAL_COMMUNITY): Payer: Self-pay

## 2023-12-01 ENCOUNTER — Encounter: Payer: Self-pay | Admitting: Hematology

## 2023-12-01 ENCOUNTER — Ambulatory Visit: Admitting: Physician Assistant

## 2023-12-01 VITALS — BP 155/74 | HR 76 | Temp 97.7°F | Wt 162.2 lb

## 2023-12-01 DIAGNOSIS — S80869A Insect bite (nonvenomous), unspecified lower leg, initial encounter: Secondary | ICD-10-CM | POA: Diagnosis not present

## 2023-12-01 DIAGNOSIS — Z6830 Body mass index (BMI) 30.0-30.9, adult: Secondary | ICD-10-CM | POA: Diagnosis not present

## 2023-12-01 DIAGNOSIS — I739 Peripheral vascular disease, unspecified: Secondary | ICD-10-CM

## 2023-12-01 DIAGNOSIS — W57XXXA Bitten or stung by nonvenomous insect and other nonvenomous arthropods, initial encounter: Secondary | ICD-10-CM | POA: Diagnosis not present

## 2023-12-01 MED ORDER — DOXYCYCLINE HYCLATE 100 MG PO CAPS
100.0000 mg | ORAL_CAPSULE | Freq: Two times a day (BID) | ORAL | 0 refills | Status: DC
  Filled 2023-12-01: qty 14, 7d supply, fill #0

## 2023-12-01 NOTE — Progress Notes (Signed)
 HISTORY AND PHYSICAL     CC:  follow up. Requesting Provider:  Atilano Deward ORN, MD  HPI: This is a 89 y.o. female who is here today for follow up for PAD.  Pt has hx of decreased ABI.    Pt was last seen 06/02/2023 and at that time, she was having some pain radiating down the back of both legs and had started a prednisone taper but without relief.  She has neuropathy and cannot feel her feet as well as has burning sensation.  It was felt that her pain was sciatica and sx appeared to be from neuropathy.  She was scheduled for 6 month follow up.  The pt returns today for follow up and here with her husband.  She states they both lost their partners and they have now been married 25 years.   She denies any claudication, rest pain or non healing wounds.  She states that on Friday, she was walking through her yard and was bitten by what she thinks may have been a spider on the left leg, right leg and neck.  She states there was redness and swelling but this has improved.  She does have some mild pitting edema bilateral ankles from her socks.  She states swelling is improved in the mornings after being in bed.  She does have a blister on the left lower leg.  She has not had any fever or chills.    The pt is on a statin for cholesterol management.    The pt is on an aspirin .    Other AC:  none The pt is on CCB, ARB for hypertension.  The pt is not on diabetic medication. Tobacco hx:  former   Past Medical History:  Diagnosis Date   Arthritis    maybe a little in my left knee (02/23/2017)   Asthmatic bronchitis    Cancer of left breast (HCC) 2019   Coronary atherosclerosis    Minor nonobstructive CAD at cardiac catheterization 2009   Dyslipidemia    Femoral bruit 7/08   With normal ABIs   Hypertension    Iliac artery aneurysm    Lower extremity edema    Chronic   Pneumonia ~ 1950   in 8th grade   Type II diabetes mellitus (HCC)     Past Surgical History:  Procedure Laterality Date    APPENDECTOMY     BREAST BIOPSY Left 02/2017   CARDIAC CATHETERIZATION     long time ago (02/23/2017)   MASTECTOMY COMPLETE / SIMPLE W/ SENTINEL NODE BIOPSY Left 02/23/2017   TOTAL MATECTOMY;  LEFT AXILLARY SENTINEL LYMPH NODE BIOPSY ERAS PATHWAY   MASTECTOMY WITH RADIOACTIVE SEED GUIDED EXCISION AND AXILLARY SENTINEL LYMPH NODE BIOPSY Left 02/23/2017   Procedure: LEFT TOTAL MATECTOMY;  LEFT AXILLARY SENTINEL LYMPH NODE BIOPSY ERAS PATHWAY;  Surgeon: Mikell Katz, MD;  [facility];  Service: General;  Laterality: Left;   PACEMAKER IMPLANT N/A 10/10/2020   Procedure: PACEMAKER IMPLANT;  Surgeon: Waddell Danelle ORN, MD;  [facility];  Service: Cardiovascular;  Laterality: N/A;   PILONIDAL CYST EXCISION     PORT-A-CATH REMOVAL Right 09/27/2018   Procedure: REMOVAL PORT-A-CATH;  Surgeon: Gail Favorite, MD;  Location: [facility];  Service: General;  Laterality: Right;   PORTA CATH INSERTION  02/23/2017   PORTACATH PLACEMENT N/A 02/23/2017   Procedure: INSERTION PORT-A-CATH;  Surgeon: Mikell Katz, MD;  Location: [facility];  Service: General;  Laterality: N/A;   SHOULDER ARTHROSCOPY  W/ ROTATOR CUFF REPAIR Right     No Known Allergies  Current Outpatient Medications  Medication Sig Dispense Refill   alendronate (FOSAMAX) 70 MG tablet Take 70 mg by mouth once a week. Takes every Monday     amLODipine  (NORVASC ) 5 MG tablet Take 1 tablet (5 mg total) by mouth daily. 90 tablet 3   aspirin  81 MG EC tablet Take 81 mg by mouth at bedtime.     Biotin 5 MG CAPS Take 5 mg by mouth daily.     chlorthalidone  (HYGROTON ) 25 MG tablet Take 25 mg by mouth daily.     Coenzyme Q10 200 MG capsule Take 200 mg by mouth daily.     cyclobenzaprine (FLEXERIL) 10 MG tablet Take 10 mg by mouth at bedtime.     latanoprost  (XALATAN ) 0.005 % ophthalmic solution Place 1 drop into both eyes at bedtime.     losartan  (COZAAR ) 100 MG tablet Take 100 mg by mouth daily.     metFORMIN   (GLUCOPHAGE ) 500 MG tablet Take 1,000 mg by mouth daily.     pregabalin  (LYRICA ) 150 MG capsule TAKE ONE CAPSULE BY MOUTH TWICE A DAY 60 capsule 2   rosuvastatin  (CRESTOR ) 5 MG tablet Take 5 mg by mouth every other day.     vitamin B-12 (CYANOCOBALAMIN ) 1000 MCG tablet Take 1,000 mcg by mouth daily.     Vitamin D, Cholecalciferol, 25 MCG (1000 UT) CAPS Take 25 mcg by mouth daily.     No current facility-administered medications for this visit.    Family History  Problem Relation Age of Onset   Diabetes Mother    Stroke Father    Cancer Father        unknown type     Social History   Socioeconomic History   Marital status: Married    Spouse name: Not on file   Number of children: 2   Years of education: Not on file   Highest education level: Not on file  Occupational History   Occupation: clinical cytogeneticist for [employer]    Comment: only works during tax season but not currently    Occupation: in research scientist (physical sciences)    Occupation: retired theatre manager of education     Comment: 1997  Tobacco Use   Smoking status: Former    Current packs/day: 0.00    Average packs/day: 2.0 packs/day for 46.0 years (92.1 ttl pk-yrs)    Types: Cigarettes    Start date: 11/09/1953    Quit date: 02/03/1991    Years since quitting: 32.8   Smokeless tobacco: Never  Vaping Use   Vaping status: Never Used  Substance and Sexual Activity   Alcohol  use: Yes    Alcohol /week: 2.0 standard drinks of alcohol     Types: 2 Glasses of wine per week    Comment: occasionally   Drug use: No   Sexual activity: Not on file  Other Topics Concern   Not on file  Social History Narrative   Lives in [place] with her husband.   Has 2 grown children.   10-25-17 Unable to ask abuse questions husband with her today.   Social Drivers of Corporate Investment Banker Strain: Not on file  Food Insecurity: Not on file  Transportation Needs: Not on file  Physical Activity: Not on file  Stress: Not on file  Social Connections: Not on file   Intimate Partner Violence: Unknown (01/28/2023)   Received from [facility]    Abuse Screen  Feels Unsafe at Home or Work/School: no    Feels Threatened by Someone: no    Abuse Screen - Does Anyone Try to Keep you from Having Contact with Others: Not on file     Abuse Screen - Physical Signs of Abuse Present: Not on file     REVIEW OF SYSTEMS:   [X]  denotes positive finding, [ ]  denotes negative finding Cardiac  Comments:  Chest pain or chest pressure:    Shortness of breath upon exertion:    Short of breath when lying flat:    Irregular heart rhythm:        Vascular    Pain in calf, thigh, or hip brought on by ambulation:    Pain in feet at night that wakes you up from your sleep:     Blood clot in your veins:    Leg swelling:         Pulmonary    Oxygen at home:    Productive cough:     Wheezing:         Neurologic    Sudden weakness in arms or legs:     Sudden numbness in arms or legs:     Sudden onset of difficulty speaking or slurred speech:    Temporary loss of vision in one eye:     Problems with dizziness:         Gastrointestinal    Blood in stool:     Vomited blood:         Genitourinary    Burning when urinating:     Blood in urine:        Psychiatric    Major depression:         Hematologic    Bleeding problems:    Problems with blood clotting too easily:        Skin    Rashes or ulcers:        Constitutional    Fever or chills:      PHYSICAL EXAMINATION:  Today's Vitals   12/01/23 0902  BP: (!) 155/74  Pulse: 76  Temp: 97.7 F (36.5 C)  TempSrc: Temporal  Weight: 162 lb 3.2 oz (73.6 kg)   Body mass index is 28.73 kg/m.   General:  WDWN appearing younger than stated age and in NAD; vital signs documented above Gait: Not observed HENT: WNL, normocephalic Pulmonary: normal non-labored breathing , without wheezing Cardiac: regular HR, without carotid bruits Abdomen: soft, NT; aortic pulse is not  palpable Skin: without rashes Vascular Exam/Pulses:  Right Left  Radial 2+ (normal) 2+ (normal)  AT Brisk doppler flow Brisk doppler floe  PT faint doppler flow Brisk doppler flow   Extremities: without ischemic changes, without Gangrene , without cellulitis; without open wounds; erythema pattern on left shin and calf, right calf and right neck.  + fluid filled small blister left medial lower leg below the knee Musculoskeletal: no muscle wasting or atrophy  Neurologic: A&O X 3 Psychiatric:  The pt has Normal affect.   Non-Invasive Vascular Imaging:   ABI's/TBI's on 12/01/2023: Right:  0.61/0.39 - Great toe pressure: 64 Left:  0.66/0.33 - Great toe pressure: 54   Previous ABI's/TBI's on 05/25/2023: Right:  0.53/0.36 - Great toe pressure: 54 Left:  0.66/0.51 - Great toe pressure:  77    ASSESSMENT/PLAN:: 89 y.o. female here for follow up for PAD with hx of decreased ABI and neuropathy   -pt doing well without rest pain, non healing wounds or claudication.   -  continue asa/statin -discussed importance of increased walking daily -I discussed with her and her husband that even if her ABI numbers were to drop and she remained asymptomatic, we would not intervene unless she was having rest pain or a non healing wound.  I did discuss with her to follow up as needed if she developed either of these or continuing with follow up regularly.  She would like to be seen in a year.  She knows that if any of her erythema or blistering worsens or she develops rest pain or non healing wounds, she will contact us  sooner.   -recommend not removing toenail and did discuss this with pt and her husband.   -she did get bitten by possibly spider on Friday and erythema is improving but still present.  Discussed with Dr. Sheree and will send Doxycycline rx to pharmacy and I have asked her to follow up with her PCP.    Lucie Apt, [facility] [phone]  Clinic MD:   Sheree

## 2023-12-02 ENCOUNTER — Encounter: Payer: Self-pay | Admitting: Physical Therapy

## 2023-12-02 ENCOUNTER — Ambulatory Visit: Admitting: Physical Therapy

## 2023-12-02 DIAGNOSIS — M6281 Muscle weakness (generalized): Secondary | ICD-10-CM | POA: Diagnosis not present

## 2023-12-02 DIAGNOSIS — R2689 Other abnormalities of gait and mobility: Secondary | ICD-10-CM

## 2023-12-02 DIAGNOSIS — M5459 Other low back pain: Secondary | ICD-10-CM

## 2023-12-02 NOTE — Therapy (Signed)
 OUTPATIENT PHYSICAL THERAPY TREATMENT/Discharge PHYSICAL THERAPY DISCHARGE SUMMARY  Visits from Start of Care: 5  Current functional level related to goals / functional outcomes: See below   Remaining deficits: See below   Education / Equipment: HEP  Plan:  Patient goals were some met. Patient is being discharged due to she is moving out of this area.       Patient Name: Penny Huynh MRN: [id number] DOB:[birth year], 89 y.o., female Today's Date: 12/02/2023  END OF SESSION:  PT End of Session - 12/02/23 0804     Visit Number 5    Number of Visits 8    Date for Recertification  12/23/23    PT Start Time 0800    PT Stop Time 0838    PT Time Calculation (min) 38 min    Activity Tolerance Patient tolerated treatment well    Behavior During Therapy [facility] for tasks assessed/performed            Past Medical History:  Diagnosis Date   Arthritis    maybe a little in my left knee (02/23/2017)   Asthmatic bronchitis    Cancer of left breast (HCC) 2019   Coronary atherosclerosis    Minor nonobstructive CAD at cardiac catheterization 2009   Dyslipidemia    Femoral bruit 7/08   With normal ABIs   Hypertension    Iliac artery aneurysm    Lower extremity edema    Chronic   Pneumonia ~ 1950   in 8th grade   Type II diabetes mellitus (HCC)    Past Surgical History:  Procedure Laterality Date   APPENDECTOMY     BREAST BIOPSY Left 02/2017   CARDIAC CATHETERIZATION     long time ago (02/23/2017)   MASTECTOMY COMPLETE / SIMPLE W/ SENTINEL NODE BIOPSY Left 02/23/2017   TOTAL MATECTOMY;  LEFT AXILLARY SENTINEL LYMPH NODE BIOPSY ERAS PATHWAY   MASTECTOMY WITH RADIOACTIVE SEED GUIDED EXCISION AND AXILLARY SENTINEL LYMPH NODE BIOPSY Left 02/23/2017   Procedure: LEFT TOTAL MATECTOMY;  LEFT AXILLARY SENTINEL LYMPH NODE BIOPSY ERAS PATHWAY;  Surgeon: Mikell Katz, MD;  Location: [facility] OR;  Service: General;  Laterality: Left;   PACEMAKER IMPLANT N/A 10/10/2020    Procedure: PACEMAKER IMPLANT;  Surgeon: Waddell Danelle ORN, MD;  Location: [facility];  Service: Cardiovascular;  Laterality: N/A;   PILONIDAL CYST EXCISION     PORT-A-CATH REMOVAL Right 09/27/2018   Procedure: REMOVAL PORT-A-CATH;  Surgeon: Gail Favorite, MD;  Location: [facility];  Service: General;  Laterality: Right;   PORTA CATH INSERTION  02/23/2017   PORTACATH PLACEMENT N/A 02/23/2017   Procedure: INSERTION PORT-A-CATH;  Surgeon: Mikell Katz, MD;  Location: [facility] OR;  Service: General;  Laterality: N/A;   SHOULDER ARTHROSCOPY W/ ROTATOR CUFF REPAIR Right    Patient Active Problem List   Diagnosis Date Noted   Chronic diastolic heart failure (HCC) 05/10/2023   Iliac artery aneurysm 05/10/2023   Lumbar radiculopathy 11/26/2022   Chronic cough 11/26/2022   Pacemaker 01/14/2021   Complete heart block (HCC) 10/09/2020   Occult blood positive stool 04/17/2020   Melena 12/06/2019   PAD (peripheral artery disease) 03/17/2019   Osteoporosis 11/08/2018   Asthmatic bronchitis 07/13/2018   Hyponatremia 07/13/2018   Tenosynovitis, de Quervain 07/13/2018   Other obesity due to excess calories 03/22/2018   Chemotherapy-induced peripheral neuropathy 09/03/2017   Port-A-Cath in place 04/14/2017   Stage II breast cancer, left (HCC) 02/23/2017   Carcinoma of upper-outer quadrant of left breast in female,  estrogen receptor positive (HCC) 02/10/2017   Carcinoma of upper-inner quadrant of left breast in female, estrogen receptor positive (HCC) 02/10/2017   Mixed hyperlipidemia 01/12/2016   Type 2 diabetes mellitus with diabetic neuropathy, unspecified (HCC) 01/12/2016   Chest pain 07/18/2015   Hypokalemia 07/18/2015   Vertigo 07/18/2015   Benign paroxysmal vertigo, unspecified ear 07/17/2015   Allergic rhinitis 10/15/2011   Carpal tunnel syndrome 11/12/2010   Pure hypercholesterolemia 11/10/2010   LBBB (left bundle branch block) 05/28/2009   AODM 12/21/2007    Essential hypertension, benign 12/21/2007   DIASTOLIC DYSFUNCTION 12/21/2007   FEMORAL BRUIT, RIGHT 12/21/2007   DYSPNEA 12/21/2007    PCP: Atilano Deward ORN, MD   REFERRING PROVIDER: Onita Duos, MD   REFERRING DIAG:  G62.0,T45.1X5A (ICD-10-CM) - Chemotherapy-induced peripheral neuropathy  I44.2 (ICD-10-CM) - Complete heart block (HCC)  I72.3 (ICD-10-CM) - Iliac artery aneurysm  I73.9 (ICD-10-CM) - PAD (peripheral artery disease)  H81.10 (ICD-10-CM) - Benign paroxysmal vertigo, unspecified laterality    Rationale for Evaluation and Treatment:  Rehabiliation  THERAPY DIAG:  Other abnormalities of gait and mobility  Muscle weakness (generalized)  Other low back pain  ONSET DATE: Chronic pain, more problems since chemo in 2020   SUBJECTIVE:                                                                                                                                                                                           SUBJECTIVE STATEMENT:She says the pain is a little better  Per Eval: She noticed gradual onset bilateral feet and fingertips paresthesia since finishing chemotherapy 2020 during her breast cancer treatment, which has been persistent since then, initially she has no trouble walking, but since 2025, she noticed increased gait abnormality, walker for longer distance, worsening bilateral feet numbness tingling in the fingertips paresthesia, low back pain, lower extremity discomfort. She says surgery has been recommended for her back but she does not want to have this. She uses cane or walker to help with her balance.   PERTINENT HISTORY:  See above PMH, PACEMAKER!!!!  PAIN:  NPRS scale: 5/10 upon arrival Pain location:legs and low back Pain description: constant, can be dull and sharp Aggravating factors: Sitting too long, walking, reaching, cleaning Relieving factors: meds, has tried ice and heat which did not help   PRECAUTIONS: ,  PACEMAKER  RED  FLAGS: None   WEIGHT BEARING RESTRICTIONS:  No  FALLS:  Has patient fallen in last 6 months? Yes. Number of falls 1, her knee buckled on her and she fell, she does feel her balance is not good so uses cane or walker  PLOF:  Independent  with basic ADLs  PATIENT GOALS:  Reduce pain, help with balance, help with leg strength  OBJECTIVE:  Note: Objective measures were completed at Evaluation unless otherwise noted.  DIAGNOSTIC FINDINGS:  EMG nerve conduction study October 13, 2023, confirmed chronic right lumbosacral radiculopathy, with superimposed moderate severe axonal sensorimotor polyneuropathy, there is also evidence of moderate right carpal tunnel syndrome  MRI of the lumbar spine September 01, 2023, moderate lumbar spondylosis, progressed from prior area, most pronounced L4-5, severe canal stenosis, moderately severe bilateral foraminal narrowing,   PATIENT SURVEYS:  Patient-Specific Activity Scoring Scheme  0 represents "unable to perform." 10 represents "able to perform at prior level. 0 1 2 3 4 5 6 7 8 9  10 (Date and Score)   Activity Eval  12/01/33   1. reaching 5  8  2. laundry  5  10  3.     4.    5.    Score 5/10 9/10   Total score = sum of the activity scores/number of activities Minimum detectable change (90%CI) for average score = 2 points Minimum detectable change (90%CI) for single activity score = 3 points    GAIT: Eval: Assistive device utilized: uses cane and sometimes walker Level of assistance: Modified independence Comments: limited community distances    UPPER EXTREMITY MMT:  MMT Right eval Left eval Right/Left 12/01/33  Shoulder flexion 4 4 4+/4+  Shoulder extension     Shoulder abduction 4 3+ 4+/4+  Shoulder adduction     Shoulder extension     Shoulder internal rotation 5 5 5/5  Shoulder external rotation 4 4 4+/4+  Middle trapezius     Lower trapezius     Elbow flexion 4 4 5/5  Elbow extension 4 4 5/5  Wrist flexion      Wrist extension     Wrist ulnar deviation     Wrist radial deviation     Wrist pronation     Wrist supination     Grip strength      (Blank rows = not tested)   LUMBAR ROM:   Active  A/PROM  eval AROM 12/01/33  Flexion WNL WNL  Extension To netural only 25%  Right lateral flexion 25% 50%  Left lateral flexion 25% 50%  Right rotation 50% 50%  Left rotation 50% 50%   (Blank rows = not tested)   LOWER EXTREMITY STRENGTH:     MMT in sitting Right eval Left eval Rt/Lt 12/02/23  Hip flexion 4 4 4+/4+  Hip extension     Hip abduction 4 4 4+/4+  Hip adduction     Hip internal rotation     Hip external rotation     Knee flexion 5 5 5/5  Knee extension 4 4 5/5  Ankle dorsiflexion     Ankle plantarflexion     Ankle inversion     Ankle eversion      (Blank rows = not tested)     FUNCTIONAL TESTS:  10/28/23 TUG time, timed up and go: 22.7 seconds using [facility]  12/02/23 TUG time, timed up and go: 15 seconds seconds using [facility]  TREATMENT DATE:  12/02/23 Therex Nu step L4 X 8 min UE/LE with moist heat to low back Seated lumbar flexion stretch with pball roll outs 5 sec X 10  Theractivity Seated ab set with pball isometric shoulder ext 5 sec X 10 and isometric hip flexion 5 sec X 10 Stand rows red X 15 Stand chest press red X 15 Seated SLR 2  X 10 bilat Sit to stand X 5 reps  Neuromuscular re-ed Balance with feet together and head turns 30  sec X 2 Balance with feet together and head nods 30  sec X 2 Modified tandem balance 30 sec X 4 total, 2 bilat each way Balance with feet closer together with head turns X 10s    11/12/23 Therex Nu step L4 X 8 min UE/LE with moist heat to low back Seated lumbar flexion stretch with pball roll outs 5 sec X 10  Theractivity Seated ab set with pball isometric shoulder ext 5 sec X 10 and isometric hip  flexion 5 sec X 5 Seated rows red X 15 Seated chest press red X 15 Seated SLR X 10 bilat Sit to stand X 5 reps  Neuromuscular re-ed Modified tandem balance 30 sec X 4 total, 2 bilat each way Balance with feet closer together with head turns X 10   Manual therapy Soft tissue mobilizaiton with biofreeze to low back   PATIENT EDUCATION: Education details: HEP, PT plan of care, selfcare Person educated: Patient Education method: Explanation, Demonstration, Verbal cues, and Handouts Education comprehension: verbalized understanding, further education recommended   HOME EXERCISE PROGRAM: Access Code: [email or web address] URL: [email or web address] Date: 10/28/2023 Prepared by: Redell Moose  Exercises - Seated Bicep Curls Supinated with Dumbbells  - 2 x daily - 6 x weekly - 2 sets - 10 reps - Seated Shoulder Flexion with Dumbbells  - 2 x daily - 6 x weekly - 2 sets - 10 reps - Seated Shoulder Abduction with Dumbbells - Thumbs Up  - 2 x daily - 6 x weekly - 2 sets - 10 reps - Seated Active Straight-Leg Raise  - 2 x daily - 6 x weekly - 2 sets - 10 reps - Sit to Stand  - 2 x daily - 6 x weekly - 1 sets - 10 reps - Semi-Tandem Balance at [employer] Eyes Open  - 2 x daily - 6 x weekly - 1 sets - 3 reps - 15 sec hold - Corner Balance Feet Together: Eyes Open With Head Turns  - 2 x daily - 6 x weekly - 1 sets - 10 reps - Wide Stance with Head Nods and Counter Support  - 2 x daily - 6 x weekly - 1 sets - 10 reps  ASSESSMENT:  CLINICAL IMPRESSION: This was her las visit scheduled here as she is moving to [place]. She plans to continue with HEP and will look to find another PT clinic to transfer to. PT goals were some met.     Per eval: Penny Huynh is a 89 y.o. female  with gait abnormality, poor balance, general weakness and chronic back pain with severe spinal stenosis. EMG nerve conduction study October 13, 2023, confirmed chronic right lumbosacral radiculopathy,  with superimposed moderate severe axonal sensorimotor polyneuropathy, there is also evidence of moderate right carpal tunnel syndrome.. Patient will benefit from skilled PT to improve overall function and to address impairments and limitations listed below.  OBJECTIVE IMPAIRMENTS: decreased activity tolerance for ADL's, difficulty walking, decreased balance, decreased endurance,  decreased mobility, decreased ROM, decreased strength, impaired flexibility, impaired UE/LE use, and pain.  ACTIVITY LIMITATIONS: bending, liftting, walking, standing, cleaning, community activity,  reaching, carry  PERSONAL FACTORS: see above PMH  also affecting patient's functional outcome.  REHAB POTENTIAL: Good  CLINICAL DECISION MAKING: Evolving/moderate complexity  EVALUATION COMPLEXITY: Moderate    GOALS: Short term PT Goals Target date: 11/25/2023   Pt will be I and compliant with HEP. Baseline:  Goal status: MET 11/16/23 Pt will decrease pain by 25% overall Baseline: Goal status: MET 12/01/33  Long term PT goals Target date:12/23/2023   Pt will improve TUG time to less than 16 seconds to show improved gait speed and balance Baseline:26 Goal status: ongoing 12/02/23 Pt will improve  strength to at least 4+/5 MMT to improve functional strength Baseline: Goal status: MET 12/02/23 Pt will improve Patient specific functional scale (PSFS) to at least 7/10 to show improved function level Baseline:5/10 Goal status: MET 12/02/23 Pt will reduce pain to overall less than 3/10 with usual activity and work activity. Baseline: Goal status: ongoing 12/02/23 Pt will improve BERG balance test to at least 40 points to show improved balance.  Baseline:33 Goal status: ongoing 12/02/23  PLAN: PT FREQUENCY: 1-2 times per week   PT DURATION: 6-8 weeks  PLANNED INTERVENTIONS  97110-Therapeutic exercises, 97530- Therapeutic activity, 97112- Neuromuscular re-education, 97535- Self Care, [id number]- Manual  therapy, and Patient/Family education  PLAN FOR NEXT SESSION: DC today as she is moving.   Redell JONELLE Moose, PT,DPT 12/02/2023, 8:15 AM

## 2024-01-20 ENCOUNTER — Ambulatory Visit

## 2024-01-20 DIAGNOSIS — I442 Atrioventricular block, complete: Secondary | ICD-10-CM

## 2024-01-20 LAB — CUP PACEART REMOTE DEVICE CHECK
Battery Remaining Longevity: 71 mo
Battery Remaining Percentage: 68 %
Battery Voltage: 3.01 V
Brady Statistic AP VP Percent: 21 %
Brady Statistic AP VS Percent: 1 %
Brady Statistic AS VP Percent: 79 %
Brady Statistic AS VS Percent: 1 %
Brady Statistic RA Percent Paced: 21 %
Brady Statistic RV Percent Paced: 99 %
Date Time Interrogation Session: 20251218035138
Implantable Lead Connection Status: 753985
Implantable Lead Connection Status: 753985
Implantable Lead Implant Date: 20220908
Implantable Lead Implant Date: 20220908
Implantable Lead Location: 753859
Implantable Lead Location: 753860
Implantable Pulse Generator Implant Date: 20220908
Lead Channel Impedance Value: 480 Ohm
Lead Channel Impedance Value: 510 Ohm
Lead Channel Pacing Threshold Amplitude: 0.75 V
Lead Channel Pacing Threshold Amplitude: 0.75 V
Lead Channel Pacing Threshold Pulse Width: 0.5 ms
Lead Channel Pacing Threshold Pulse Width: 0.5 ms
Lead Channel Sensing Intrinsic Amplitude: 1.5 mV
Lead Channel Sensing Intrinsic Amplitude: 8.3 mV
Lead Channel Setting Pacing Amplitude: 2 V
Lead Channel Setting Pacing Amplitude: 2.5 V
Lead Channel Setting Pacing Pulse Width: 0.5 ms
Lead Channel Setting Sensing Sensitivity: 2 mV
Pulse Gen Model: 2272
Pulse Gen Serial Number: 6519562

## 2024-01-23 NOTE — Progress Notes (Signed)
 Remote PPM Transmission

## 2024-01-30 ENCOUNTER — Ambulatory Visit: Payer: Self-pay | Admitting: Internal Medicine

## 2024-02-16 NOTE — Progress Notes (Signed)
 "        Provider:  Dedra Gores, MD  Primary Care Physician:  Atilano Deward ORN, MD [address] [place] [address]     Referring Provider: Atilano Deward ORN, Md [address] [place],  [place] [address]          Chief Complaint according to patient   Patient presents with:                HISTORY OF PRESENT ILLNESS:  Penny Huynh is a 89 y.o. female patient who is here for revisit 02/17/2024 for  painful radiculopathy with valsalva induced pain shooting from buttocks to feet.  Originally referred for BPV which had resolved by the time of her consultation.     Chief concern according to patient :  patient requested follow up on chemotherapy induced neuropathy ( breast cancer 2019 ) ,    She has poor circulation in both legs , she has ongoing sensation of painful ripples form the buttocks through the back of the hamstrings and down all the way to the feet.   This fits radiculopathy, the pain is worse when she coughs or sneezes.  Valsalva  induced.   Dr Onita provided EMG and NCV-  Selected needle examinations were performed at right lower extremity muscles, lumbosacral paraspinal muscles; right upper extremity muscles and cervical paraspinal muscles.   There was evidence of chronic neuropathic changes involving the right L4-5 S1 myotomes.  There is no evidence of active denervation, no spontaneous activity at right lower lumbar paraspinal muscles.     Conclusion: This is an abnormal study.  There is electrodiagnostic evidence of chronic right lumbosacral radiculopathy involving right L4-5 S1 myotomes, superimposed on moderately severe axonal sensorimotor polyneuropathy.  In addition, there is evidence of right distal median neuropathy across the wrist consistent with moderate right carpal tunnel syndrome.  abnormal CTA study, lower extremities : Right lower extremity: Moderate stenosis in the right common iliac artery. Aneurysmal dilatation of the right distal common iliac artery  measuring 2.0 cm in diameter. Moderate disease in the right common femoral artery. Multifocal short-segment moderate disease in the right SFA with severe short-segment focal stenosis in the distal segment. Runoff evaluation is limited by phase of contrast agent acquisition. Long segment occlusions of the anterior tibial and posterior tibial territory are favored. The peroneal artery is likely the primary runoff vessel. 3. Left lower extremity: Mild inflow disease. Moderate left SFA disease which is short segment and multifocal. Patent popliteal artery with mild disease. .  CT lumbar spine abnormal : spinal stenosis L 4-5 _ Moderate to severe spinal stenosis suspected (series 4, image 79) with bilateral lateral recess involvement (L5 nerve levels). L4 neural foraminal stenosis appears greater on the left and moderate to severe on that side, perhaps in part related to asymmetric foraminal disc.   L5-S1: Chronic disc degeneration. Vacuum disc and severe disc space loss. Circumferential disc osteophyte complex. Moderate facet hypertrophy. However, no significant spinal stenosis. Moderate bilateral L5 foraminal stenosis is chronic and probably not significantly changed.   CT angio head and neck : IMPRESSION: 1. Chronic white matter abnormalities, no hemorrhage 2. No significant extracranial carotid artery stenosis 3. No significant vertebrobasilar or intracranial disease    First visit / last visit:  Penny Huynh is a 89 y.o. female patient who is seen upon referral on 08/16/2023 from Dr Atilano  for a Consult regarding vertigo. She walks with a walker, has a pacemaker ,  has slight HA in the evening, a feeling of pressure , also sciatica- legs  just buckle.  She had a fall  with bruising on the right hip and was seen PT.  Not in vestibular rehab.  Can't have MRI.  Vertigo while watching TV- not related to movements. Not orthostatic. BP taken on right arm due to mastectomy.  PAD in  popliteal, femoral artery.  Has cramps at night , not while walking.      Chief concern according to patient :    Pt referred by PCP for dizziness and headaches. Pt states this has been going on for several months. States sometimes head just feels so full and sometimes the room spins. Pt states last week turned her head to the left and lost vision for a couple of seconds. This concerns her because she drives and does all the driving for her and her husband. The headaches have not been too bad and usually come in late afternoon.   Resulting multifactorial Gait instability.     PT referral. : Penny Huynh is a 89 y.o. female  with gait abnormality, poor balance, general weakness and chronic back pain with severe spinal stenosis. EMG nerve conduction study October 13, 2023, confirmed chronic right lumbosacral radiculopathy, with superimposed moderate severe axonal sensorimotor polyneuropathy, there is also evidence of moderate right carpal tunnel syndrome..  Patient will benefit from skilled PT to improve overall function and to address impairments and limitations listed below.  Long standing Lyrica  therapy since 2019. Dr Atilano.  Gabapentin  had not helped, was tried before.       Review of Systems: Out of a complete 14 system review, the patient complains of only the following symptoms, and all other reviewed systems are negative.:   PAD, spinal stenosis, foraminal stenosis, started as sciatica- chemotherapy induced NP.       Social History   Socioeconomic History   Marital status: Married    Spouse name: Not on file   Number of children: 2   Years of education: Not on file   Highest education level: Not on file  Occupational History   Occupation: clinical cytogeneticist for [employer]    Comment: only works during tax season but not currently    Occupation: in research scientist (physical sciences)    Occupation: retired theatre manager of education     Comment: 1997  Tobacco Use   Smoking status: Former    Current  packs/day: 0.00    Average packs/day: 2.0 packs/day for 46.0 years (92.1 ttl pk-yrs)    Types: Cigarettes    Start date: 11/09/1953    Quit date: 02/03/1991    Years since quitting: 33.0   Smokeless tobacco: Never  Vaping Use   Vaping status: Never Used  Substance and Sexual Activity   Alcohol  use: Yes    Alcohol /week: 2.0 standard drinks of alcohol     Types: 2 Glasses of wine per week    Comment: occasionally   Drug use: No   Sexual activity: Not on file  Other Topics Concern   Not on file  Social History Narrative   Lives in [place] with her husband.   Has 2 grown children.   10-25-17 Unable to ask abuse questions husband with her today.   Social Drivers of Health   Tobacco Use: Medium Risk (02/17/2024)   Patient History    Smoking Tobacco Use: Former    Smokeless Tobacco Use: Never    Passive Exposure: Not on file  Financial Resource Strain: Not  on file  Food Insecurity: Not on file  Transportation Needs: Not on file  Physical Activity: Not on file  Stress: Not on file  Social Connections: Not on file  Depression (PHQ2-9): Low Risk (10/28/2023)   Depression (PHQ2-9)    PHQ-2 Score: 0  Alcohol  Screen: Not on file  Housing: Not on file  Utilities: Not on file  Health Literacy: Not on file    Family History  Problem Relation Age of Onset   Diabetes Mother    Stroke Father    Cancer Father        unknown type     Past Medical History:  Diagnosis Date   Arthritis    maybe a little in my left knee (02/23/2017)   Asthmatic bronchitis    Cancer of left breast (HCC) 2019   Coronary atherosclerosis    Minor nonobstructive CAD at cardiac catheterization 2009   Dyslipidemia    Femoral bruit 7/08   With normal ABIs   Hypertension    Iliac artery aneurysm    Lower extremity edema    Chronic   Pneumonia ~ 1950   in 8th grade   Type II diabetes mellitus (HCC)     Past Surgical History:  Procedure Laterality Date   APPENDECTOMY     BREAST BIOPSY Left 02/2017    CARDIAC CATHETERIZATION     long time ago (02/23/2017)   MASTECTOMY COMPLETE / SIMPLE W/ SENTINEL NODE BIOPSY Left 02/23/2017   TOTAL MATECTOMY;  LEFT AXILLARY SENTINEL LYMPH NODE BIOPSY ERAS PATHWAY   MASTECTOMY WITH RADIOACTIVE SEED GUIDED EXCISION AND AXILLARY SENTINEL LYMPH NODE BIOPSY Left 02/23/2017   Procedure: LEFT TOTAL MATECTOMY;  LEFT AXILLARY SENTINEL LYMPH NODE BIOPSY ERAS PATHWAY;  Surgeon: Mikell Katz, MD;  Location: [facility] OR;  Service: General;  Laterality: Left;   PACEMAKER IMPLANT N/A 10/10/2020   Procedure: PACEMAKER IMPLANT;  Surgeon: Waddell Danelle ORN, MD;  Location: [facility];  Service: Cardiovascular;  Laterality: N/A;   PILONIDAL CYST EXCISION     PORT-A-CATH REMOVAL Right 09/27/2018   Procedure: REMOVAL PORT-A-CATH;  Surgeon: Gail Favorite, MD;  Location: [facility];  Service: General;  Laterality: Right;   PORTA CATH INSERTION  02/23/2017   PORTACATH PLACEMENT N/A 02/23/2017   Procedure: INSERTION PORT-A-CATH;  Surgeon: Mikell Katz, MD;  Location: [facility] OR;  Service: General;  Laterality: N/A;   SHOULDER ARTHROSCOPY W/ ROTATOR CUFF REPAIR Right      Medications Ordered Prior to Encounter[1]  Allergies[2]   DIAGNOSTIC DATA (LABS, IMAGING, TESTING) - I reviewed patient records, labs, notes, testing and imaging myself where available.  Lab Results  Component Value Date   WBC 6.7 10/28/2023   HGB 11.7 (L) 10/28/2023   HCT 35.4 (L) 10/28/2023   MCV 87.4 10/28/2023   PLT 230 10/28/2023      Component Value Date/Time   NA 129 (L) 10/28/2023 1234   NA 133 (L) 08/16/2023 1210   K 3.3 (L) 10/28/2023 1234   CL 91 (L) 10/28/2023 1234   CO2 29 10/28/2023 1234   GLUCOSE 160 (H) 10/28/2023 1234   BUN 25 (H) 10/28/2023 1234   BUN 16 08/16/2023 1210   CREATININE 0.88 10/28/2023 1234   CALCIUM  9.6 10/28/2023 1234   PROT 7.0 10/28/2023 1234   PROT 6.4 08/16/2023 1210   ALBUMIN 4.4 10/28/2023 1234   ALBUMIN 4.2 08/16/2023 1210   AST 21  10/28/2023 1234   ALT 13 10/28/2023 1234   ALKPHOS 41 10/28/2023 1234  BILITOT 1.0 10/28/2023 1234   GFRNONAA >60 10/28/2023 1234   GFRAA >60 10/06/2019 1014   Lab Results  Component Value Date   CHOL 155 10/10/2020   HDL 49 10/10/2020   LDLCALC 98 10/10/2020   TRIG 41 10/10/2020   CHOLHDL 3.2 10/10/2020   Lab Results  Component Value Date   HGBA1C 7.4 (H) 10/09/2020   Lab Results  Component Value Date   VITAMINB12 1,108 (H) 10/22/2022   Lab Results  Component Value Date   TSH 2.635 10/10/2020    PHYSICAL EXAM:  Vitals:   02/17/24 1041  BP: (!) 145/81  Pulse: 87   No data found. Body mass index is 27.29 kg/m.   Wt Readings from Last 3 Encounters:  02/17/24 159 lb (72.1 kg)  12/01/23 162 lb 3.2 oz (73.6 kg)  10/28/23 162 lb 14.4 oz (73.9 kg)     Ht Readings from Last 3 Encounters:  02/17/24 5' 4 (1.626 m)  10/28/23 5' 3 (1.6 m)  10/13/23 5' 3 (1.6 m)      General: The patient is awake, alert and appears not in acute distress and groomed. Head: Normocephalic, atraumatic.  Neck is supple. Cardiovascular:  Regular rate and cardiac rhythm by pulse, without distended neck veins. Respiratory: no shortness of breath  Skin:  Without evidence of ankle edema, or rash. Trunk: BMI is 27. 3     NEUROLOGIC EXAM: The patient is awake and alert, oriented to place and time.   Memory subjective described as intact.  Attention span & concentration ability appears normal.   Speech is fluent,  without  dysarthria, dysphonia or aphasia.  Mood and affect are appropriate.    The patient is awake, alert and appears not in acute distress. The patient is well groomed. Head: Normocephalic, atraumatic. Neck is supple. Cardiovascular:  Regular rate and cardiac rhythm by pulse,  paced rhythm- without distended neck veins. Respiratory: Lungs are clear to auscultation.  Skin:  With evidence of ankle edema, not rash. PAD in polpiteal, femoral artery.  Trunk: The patient's  posture is erect.   NEUROLOGIC EXAM: The patient is awake and alert, oriented to place and time.   Memory subjective described as intact.  Attention span & concentration ability appears normal.  Speech is fluent,  without  dysarthria, dysphonia or aphasia.  Mood and affect are appropriate.   Cranial nerves: no loss of smell or taste reported  Pupils are equal and briskly reactive to light. Funduscopic exam deferred. .  Extraocular movements in vertical and horizontal planes were intact and without nystagmus. No Diplopia. Visual fields by finger perimetry are intact. Hearing was intact to soft voice and finger rubbing.   Facial motor strength is symmetric and tongue and uvula move midline.  Neck ROM : rotation, tilt and flexion extension were normal for age and shoulder shrug was symmetrical.    Motor exam:  Symmetric bulk, tone and ROM.   Normal tone without cog wheeling, symmetric grip strength .   Sensory:  Fine touch, pinprick and vibration were tested  and  normal.  Proprioception tested in the upper extremities was normal.   Coordination: Rapid alternating movements in the fingers/hands were of normal speed.  The Finger-to-nose maneuver was intact without evidence of ataxia, dysmetria or tremor.   Gait and station: Patient could not rise unassisted from a seated position, walked with a walker  assistive device.  Very slowly walking and very insecure when turning.  Stance is of normal width/ base but slightly  stooped- and the patient turned with 6 (!)  steps.  Toe and heel walk were deferred.   Deep tendon reflexes: in the upper and lower extremities are absent.   ASSESSMENT AND PLAN :   89 y.o. year old female  here with:    1) Multifactorial Gait disorder:   PAD, neuropathy and radiculopathy with spinal stenosis , multilevel  foraminal stenosis. EMG and NCV  confirmed.  CT lumbar sone confirmed.  Peripheral artery studies confirmed.    I would be hesitant to recommend  surgery but like to consider seeing a neurosurgical office for physiatry. ( Physical medicine and Rehab / Pain intervention) .  The patient has been enrolled in PT already  in [place], [place] but moved from [place] to [place], and needs to re- establish locally with Rehab and PT .   I Have not ordered any medication in the past and would like for this patient to receive her medications through PCP , if here or in [place].  Her oncologist has written for pregabalin .   I would like to thank Dr .Lari, MD,  and Atilano, Deward ORN, Md [address] [place],  [place] [address] for allowing me to meet with this pleasant patient.    After spending a total time of  35  minutes face to face and time for  history taking, physical and neurologic examination, review of laboratory studies,  personal review of imaging studies, reports and results of other testing and review of referral information / records as far as provided in visit,   Electronically signed by: Dedra Gores, MD 02/17/2024 11:22 AM  [facility] Board certified by The [employer] of Sleep Medicine and Diplomate of the [employer] of [employer] Medicine. Board certified In Neurology through the ABPN, Fellow of the [employer] of Neu[employer].          [1]  Current Outpatient Medications on File Prior to Visit  Medication Sig Dispense Refill   alendronate (FOSAMAX) 70 MG tablet Take 70 mg by mouth once a week. Takes every Monday     amLODipine  (NORVASC ) 5 MG tablet Take 1 tablet (5 mg total) by mouth daily. 90 tablet 3   aspirin  81 MG EC tablet Take 81 mg by mouth at bedtime.     Biotin 5 MG CAPS Take 5 mg by mouth daily.     chlorthalidone  (HYGROTON ) 25 MG tablet Take 25 mg by mouth daily.     Coenzyme Q10 200 MG capsule Take 200 mg by mouth daily.     latanoprost  (XALATAN ) 0.005 % ophthalmic solution Place 1 drop into both eyes at bedtime.     losartan  (COZAAR ) 100 MG tablet Take 100 mg by mouth daily.      metFORMIN  (GLUCOPHAGE ) 500 MG tablet Take 1,000 mg by mouth daily.     pregabalin  (LYRICA ) 150 MG capsule TAKE ONE CAPSULE BY MOUTH TWICE A DAY 60 capsule 2   rosuvastatin  (CRESTOR ) 5 MG tablet Take 5 mg by mouth every other day.     vitamin B-12 (CYANOCOBALAMIN ) 1000 MCG tablet Take 1,000 mcg by mouth daily.     Vitamin D, Cholecalciferol, 25 MCG (1000 UT) CAPS Take 25 mcg by mouth daily.     No current facility-administered medications on file prior to visit.  [2] No Known Allergies  "

## 2024-02-17 ENCOUNTER — Telehealth: Payer: Self-pay | Admitting: Neurology

## 2024-02-17 ENCOUNTER — Ambulatory Visit: Admitting: Neurology

## 2024-02-17 ENCOUNTER — Encounter: Payer: Self-pay | Admitting: Neurology

## 2024-02-17 VITALS — BP 145/81 | HR 87 | Ht 64.0 in | Wt 159.0 lb

## 2024-02-17 DIAGNOSIS — R2689 Other abnormalities of gait and mobility: Secondary | ICD-10-CM

## 2024-02-17 NOTE — Telephone Encounter (Signed)
 Referral to Neurosurgery faxed  To [facility]   [facility]  Phone: [phone] Fax:[phone]

## 2024-02-17 NOTE — Patient Instructions (Signed)
 ASSESSMENT AND PLAN :   89 y.o. year old female  here with:    1) Multifactorial Gait disorder:   PAD, neuropathy and radiculopathy with spinal stenosis , multilevel  foraminal stenosis. EMG and NCV  confirmed.  CT lumbar sone confirmed.  Peripheral artery studies confirmed.    I would be hesitant to recommend surgery but like to consider seeing a neurosurgical office for physiatry. ( Physical medicine and Rehab / Pain intervention) .  The patient has been enrolled in PT already  in [place], [place] but moved from [place] to [place], and needs to re- establish locally with Rehab and PT .   I Have not ordered any medication in the past and would like for this patient to receive her medications through PCP , if here or in [place].  Her oncologist has written for pregabalin .   I would like to thank Dr .Lari, MD,  and Atilano, Deward ORN, Md [address] [place],  [place] [address] for allowing me to meet with this pleasant patient.    After spending a total time of  35  minutes face to face and time for  history taking, physical and neurologic examination, review of laboratory studies,  personal review of imaging studies, reports and results of other testing and review of referral information / records as far as provided in visit,

## 2024-03-27 ENCOUNTER — Ambulatory Visit: Payer: Self-pay | Admitting: Family Medicine

## 2024-04-20 ENCOUNTER — Encounter

## 2024-07-20 ENCOUNTER — Encounter

## 2024-10-30 ENCOUNTER — Ambulatory Visit: Admitting: Nurse Practitioner

## 2024-10-30 ENCOUNTER — Other Ambulatory Visit
